# Patient Record
Sex: Female | Born: 1960
Health system: Southern US, Community
[De-identification: ages and names within clinical notes are randomized; demographics above are authoritative.]

## PROBLEM LIST (undated history)

## (undated) DIAGNOSIS — E785 Hyperlipidemia, unspecified: Secondary | ICD-10-CM

## (undated) DIAGNOSIS — I10 Essential (primary) hypertension: Secondary | ICD-10-CM

## (undated) DIAGNOSIS — C50919 Malignant neoplasm of unspecified site of unspecified female breast: Secondary | ICD-10-CM

## (undated) DIAGNOSIS — F419 Anxiety disorder, unspecified: Secondary | ICD-10-CM

## (undated) DIAGNOSIS — E119 Type 2 diabetes mellitus without complications: Secondary | ICD-10-CM

## (undated) DIAGNOSIS — R011 Cardiac murmur, unspecified: Secondary | ICD-10-CM

## (undated) HISTORY — DX: Cardiac murmur, unspecified: R01.1

## (undated) HISTORY — DX: Anxiety disorder, unspecified: F41.9

## (undated) HISTORY — PX: BREAST BIOPSY: SHX20

## (undated) HISTORY — PX: ABDOMINAL HYSTERECTOMY: SHX81

## (undated) HISTORY — DX: Hyperlipidemia, unspecified: E78.5

---

## 2014-04-01 HISTORY — PX: MASTECTOMY: SHX3

## 2014-08-31 DIAGNOSIS — C50919 Malignant neoplasm of unspecified site of unspecified female breast: Secondary | ICD-10-CM | POA: Insufficient documentation

## 2014-09-22 DIAGNOSIS — F419 Anxiety disorder, unspecified: Secondary | ICD-10-CM | POA: Insufficient documentation

## 2014-09-22 DIAGNOSIS — F411 Generalized anxiety disorder: Secondary | ICD-10-CM | POA: Insufficient documentation

## 2014-09-22 DIAGNOSIS — I10 Essential (primary) hypertension: Secondary | ICD-10-CM | POA: Insufficient documentation

## 2014-09-22 DIAGNOSIS — F339 Major depressive disorder, recurrent, unspecified: Secondary | ICD-10-CM | POA: Insufficient documentation

## 2015-02-07 ENCOUNTER — Ambulatory Visit: Payer: BLUE CROSS/BLUE SHIELD | Attending: Radiation Oncology | Admitting: Physical Therapy

## 2015-02-07 DIAGNOSIS — R530 Neoplastic (malignant) related fatigue: Secondary | ICD-10-CM | POA: Diagnosis present

## 2015-02-07 DIAGNOSIS — Z9189 Other specified personal risk factors, not elsewhere classified: Secondary | ICD-10-CM | POA: Diagnosis present

## 2015-02-07 DIAGNOSIS — M25612 Stiffness of left shoulder, not elsewhere classified: Secondary | ICD-10-CM | POA: Diagnosis present

## 2015-02-07 NOTE — Therapy (Signed)
[place], [place], [address] Phone: [phone]   Fax:  [phone]  Physical Therapy Evaluation  Patient Details  Name: Melissa Bray MRN: [id number] Date of Birth: 28-Nov-1960 Referring Provider: Dr, Malva Limes  Encounter Date: 02/07/2015      PT End of Session - 02/07/15 1804    Visit Number 1   Number of Visits 10   Date for PT Re-Evaluation 03/09/15   PT Start Time [id number]   PT Stop Time 1517   PT Time Calculation (min) 20 min   Activity Tolerance Patient tolerated treatment well      No past medical history on file.  No past surgical history on file.  There were no vitals filed for this visit.  Visit Diagnosis:  Shoulder stiffness, left - Plan: PT plan of care cert/re-cert  Neoplastic malignant related fatigue - Plan: PT plan of care cert/re-cert  At risk for lymphedema - Plan: PT plan of care cert/re-cert      Subjective Assessment - 02/07/15 1456    Subjective pt cannot get her arm in position for radiation treatment.  She goes back to radiologist Nov. 22  She also reports that she is fatigued and hasn't been doing much.  She knows she needs to get stronger before she goes back to work    Pertinent History left mastectomy in October 2016 25  lymph nodes removed.  She has previous chemotherapy from April til August 2016 with symptoms of numbness and tingling in hands and feet that is resolving    Patient Stated Goals to be able to start radiation on November 22    Currently in Pain? No/denies            [facility] PT Assessment - 02/07/15 0001    Assessment   Medical Diagnosis breast cancer    Referring Provider Dr, Malva Limes   Onset Date/Surgical Date 06/26/14   Hand Dominance Right   Precautions   Precaution Comments --  cancer , previous chemo   Restrictions   Weight Bearing Restrictions No   Balance Screen   Has the patient fallen in the past 6 months No   Has the patient had  a decrease in activity level because of a fear of falling?  Yes  had some numbness and tingling since chemo    Is the patient reluctant to leave their home because of a fear of falling?  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 --  moving patients, transferring, physical work at    Leisure walk occasionall, not much    Cognition   Overall Cognitive Status Within Functional Limits for tasks assessed   Observation/Other Assessments   Observations pt is obese.she has tape over mastctomy incision on left anterior chest and at lateral chest from drain site    Sensation   Light Touch Not tested   Coordination   Gross Motor Movements are Fluid and Coordinated Yes   AROM   Right Shoulder Flexion 165 Degrees   Right Shoulder ABduction 161 Degrees   Left Shoulder Flexion 86 Degrees   Left Shoulder ABduction 85 Degrees   Left Shoulder Internal Rotation 80 Degrees   Left Shoulder External Rotation 55 Degrees  Strength   Overall Strength Deficits   Overall Strength Comments appears generally deconditioned   Left Shoulder Flexion 3-/5   Left Shoulder ABduction 3-/5   Left Shoulder External Rotation 3-/5           LYMPHEDEMA/ONCOLOGY QUESTIONNAIRE - 02/07/15 1515    Right Upper Extremity Lymphedema   10 cm Proximal to Olecranon Process 37.5 cm   Olecranon Process 28.5 cm   10 cm Proximal to Ulnar Styloid Process 24 cm   Just Proximal to Ulnar Styloid Process 18 cm   Across Hand at [employer] 21 cm   At [place] of 2nd Digit 6.5 cm   Left Upper Extremity Lymphedema   10 cm Proximal to Olecranon Process 37.2 cm   Olecranon Process 29.5 cm   10 cm Proximal to Ulnar Styloid Process 24.3 cm   Just Proximal to  Ulnar Styloid Process 17.9 cm   Across Hand at [employer] 21 cm   At [place] of 2nd Digit 6.3 cm           Quick Dash - 02/07/15 0001    Open a tight or new jar Moderate difficulty   Do heavy household chores (wash walls, wash floors) Mild difficulty   Carry a shopping bag or briefcase Mild difficulty   Wash your back Moderate difficulty   Use a knife to cut food No difficulty   Recreational activities in which you take some force or impact through your arm, shoulder, or hand (golf, hammering, tennis) Moderate difficulty   During the past week, to what extent has your arm, shoulder or hand problem interfered with your normal social activities with family, friends, neighbors, or groups? Modererately   During the past week, to what extent has your arm, shoulder or hand problem limited your work or other regular daily activities Modererately   Arm, shoulder, or hand pain. Moderate   Tingling (pins and needles) in your arm, shoulder, or hand Moderate   Difficulty Sleeping Moderate difficulty   DASH Score 40.91 %             [facility] Adult PT Treatment/Exercise - 02/07/15 0001    Shoulder Exercises: Supine   Other Supine Exercises dowel exercise for shoulder flexion and external rotation                 PT Education - 02/07/15 1803    Education provided Yes   Education Details supine cane exercise    Person(s) Educated Patient   Methods Explanation;Demonstration;Handout   Comprehension Verbalized understanding;Returned demonstration                [facility] Goals - 02/07/15 1801    CC Long Term Goal  #1   Title Patient will improve left shoulder abduction to 120 degrees so that she can achieve position needed for radiation therapy.   Baseline 85   Time 4   Period Weeks   Status New   CC Long Term Goal  #2   Title Patient with verbalize an understanding of lymphedema risk reduction precautions   Time 4   Period Weeks   Status New   CC Long Term  Goal  #3   Title Patient will decrease the DASH score to < 30   to demonstrate increased functional use of upper extremity   Baseline 40.91   Time 4   Period Weeks   Status New   CC Long Term Goal  #4   Title Patient will be  independent in a basic home exercise program so she can continue strengthening at home    Time 4   Period Weeks   Status New   CC Long Term Goal  #5   Title pt will increase number of sit to stand repetitions in 30 seconds by 2 to demonstrate functional improvement so she can perform daily activities with greater ease             Plan - 02/07/15 1806    Clinical Impression Statement Pt late for appt, but reports she wants to improve her shoulder range of motion so she can receive radiation. She also wants to learn about lymphedema risk reduction and  is also interested in eventual exercise program so she can increase strength to return to work.  She will also benefti from progressive strengthening program to upper quarter as she is at high risk to develop lymphedema Today, she demonstrated decreased active and passive range of motion of left shoulder abduction, flexion and external rotation. She responded well to initial exercise instruction.    Pt will benefit from skilled therapeutic intervention in order to improve on the following deficits Decreased range of motion;Obesity   Clinical Impairments Affecting Rehab Potential previous chemotherapy, obesity    PT Frequency 3x / week  may decrease to 2x per week once radiation starts    PT Duration 4 weeks   PT Treatment/Interventions Therapeutic exercise;Manual techniques;Therapeutic activities;Patient/family education;Passive range of motion   PT Next Visit Plan active and passive range of motion including external rotation, pulley, stretches. scapular activation , consider 6 minute walk test and  30 second sit to stand test for baseline    Recommended Other Services eventual strength ABC prgram, possible [facility]  at [place] and Agree with Plan of Care Patient         Problem List There are no active problems to display for this patient.   Donato Heinz. Owens Shark, PT   02/07/2015, 6:29 PM  [place], [place], [address] Phone: [phone]   Fax:  [phone]  Name: Wana Mount MRN: [id number] Date of Birth: [birth year]

## 2015-02-07 NOTE — Patient Instructions (Signed)
Cane Overhead - Supine  Hold cane at thighs with both hands, extend arms straight over head. Hold ___ seconds. Repeat ___ times. Do ___ times per day.  External Rotation (Eccentric), Active-Assist - Supine (Cane)  Lie on back, affected arm out from side, elbow at 90, forearm forward. Use cane to assist in lifting forearm of affected arm to neutral. Slowly lower for 3-5 seconds. 5__ reps per set, __5 sets per day, ___ days per week. Add ___ lbs when you achieve ___ repetitions.  Copyright  VHI. All rights reserved.

## 2015-02-08 ENCOUNTER — Ambulatory Visit: Payer: BLUE CROSS/BLUE SHIELD | Admitting: Physical Therapy

## 2015-02-14 ENCOUNTER — Ambulatory Visit: Payer: BLUE CROSS/BLUE SHIELD | Admitting: Physical Therapy

## 2015-02-14 DIAGNOSIS — Z9189 Other specified personal risk factors, not elsewhere classified: Secondary | ICD-10-CM

## 2015-02-14 DIAGNOSIS — M25612 Stiffness of left shoulder, not elsewhere classified: Secondary | ICD-10-CM | POA: Diagnosis not present

## 2015-02-14 DIAGNOSIS — R530 Neoplastic (malignant) related fatigue: Secondary | ICD-10-CM

## 2015-02-14 NOTE — Therapy (Signed)
[place], [place], [address] Phone: [phone]   Fax:  [phone]  Physical Therapy Treatment  Patient Details  Name: Melissa Bray MRN: [id number] Date of Birth: [birth year] Referring Provider: Dr, Malva Limes  Encounter Date: 02/14/2015      PT End of Session - 02/14/15 1711    Visit Number 2   Number of Visits 10   Date for PT Re-Evaluation 03/09/15   PT Start Time [id number]   PT Stop Time 1430   PT Time Calculation (min) 35 min   Activity Tolerance Patient tolerated treatment well   Behavior During Therapy [facility] for tasks assessed/performed      No past medical history on file.  No past surgical history on file.  There were no vitals filed for this visit.  Visit Diagnosis:  Shoulder stiffness, left  Neoplastic malignant related fatigue  At risk for lymphedema      Subjective Assessment - 02/14/15 1357    Subjective pt states she has been busy during the day and doing her exercises.  She is stiff when she gets out of bed in the morning.    Pertinent History left mastectomy in October 2016 25  lymph nodes removed.  She has previous chemotherapy from April til August 2016 with symptoms of numbness and tingling in hands and feet that is resolving    Patient Stated Goals to be able to start radiation on November 22    Currently in Pain? No/denies  just stiffness.                          [facility] Adult PT Treatment/Exercise - 02/14/15 0001    Shoulder Exercises: Supine   Other Supine Exercises dowel exercise for shoulder flexion and external rotation    Shoulder Exercises: Sidelying   External Rotation AROM;Left;5 reps   Other Sidelying Exercises scapular retraction    Shoulder Exercises: ROM/Strengthening   Other ROM/Strengthening Exercises wall washing    Other ROM/Strengthening Exercises table stretch into flexion and abduction    Manual Therapy   Manual Lymphatic Drainage  (MLD) brief manual lymph drainage to anterior chest, anterio and posteror shoulder    Passive ROM to left arm in all planes of motion as tolerated                        [facility] Goals - 02/07/15 1801    CC Long Term Goal  #1   Title Patient will improve left shoulder abduction to 120 degrees so that she can achieve position needed for radiation therapy.   Baseline 85   Time 4   Period Weeks   Status New   CC Long Term Goal  #2   Title Patient with verbalize an understanding of lymphedema risk reduction precautions   Time 4   Period Weeks   Status New   CC Long Term Goal  #3   Title Patient will decrease the DASH score to < 30   to demonstrate increased functional use of upper extremity   Baseline 40.91   Time 4   Period Weeks   Status New   CC Long Term Goal  #4   Title Patient will be independent in a basic home exercise program so she can continue strengthening at home    Time 4   Period Weeks   Status New   CC Long Term Goal  #5  Title pt will increase number of sit to stand repetitions in 30 seconds by 2 to demonstrate functional improvement so she can perform daily activities with greater ease             Plan - 02/14/15 1711    Clinical Impression Statement pt 10 min late for appt. She feels she has made some improvments, but still has tightness inshoulder.  Upgraded exercise program and added stretches for home.  Noted to have some edema in supraclavicular fossa and upper chest and shoulder and still has mesh on medial wound and wears a bandage over it.  Not ready for radiation yet    Clinical Impairments Affecting Rehab Potential previous chemotherapy, obesity    PT Next Visit Plan active and passive range of motion including external rotation, pulley, stretches. scapular activation , consider 6 minute walk test and  30 second sit to stand test for baseline    Consulted and Agree with Plan of Care Patient        Problem List There are  no active problems to display for this patient.  Donato Heinz. Owens Shark, PT  02/14/2015, 5:14 PM  [place], [place], [address] Phone: [phone]   Fax:  [phone]  Name: Melissa Bray MRN: [id number] Date of Birth: 12-04-1960

## 2015-02-15 ENCOUNTER — Ambulatory Visit: Payer: BLUE CROSS/BLUE SHIELD

## 2015-02-15 DIAGNOSIS — M25612 Stiffness of left shoulder, not elsewhere classified: Secondary | ICD-10-CM

## 2015-02-15 DIAGNOSIS — R530 Neoplastic (malignant) related fatigue: Secondary | ICD-10-CM

## 2015-02-15 DIAGNOSIS — Z9189 Other specified personal risk factors, not elsewhere classified: Secondary | ICD-10-CM

## 2015-02-15 NOTE — Therapy (Addendum)
[place], [place], [address] Phone: [phone]   Fax:  [phone]  Physical Therapy Treatment  Patient Details  Name: Melissa Bray MRN: [id number] Date of Birth: 09/28/1960 Referring Provider: Dr, Malva Limes  Encounter Date: 02/15/2015      PT End of Session - 02/15/15 1711    Visit Number 3   Number of Visits 10   Date for PT Re-Evaluation 03/09/15   PT Start Time 1526   PT Stop Time 1607   PT Time Calculation (min) 41 min   Activity Tolerance Patient tolerated treatment well;Patient limited by pain   Behavior During Therapy [facility] for tasks assessed/performed      No past medical history on file.  No past surgical history on file.  There were no vitals filed for this visit.  Visit Diagnosis:  Shoulder stiffness, left  Neoplastic malignant related fatigue  At risk for lymphedema      Subjective Assessment - 02/15/15 1529    Subjective Nothing new to report today   Currently in Pain? No/denies                         [facility] Adult PT Treatment/Exercise - 02/15/15 0001    Shoulder Exercises: Pulleys   Flexion 2 minutes   ABduction 2 minutes   Shoulder Exercises: Therapy Ball   Flexion 10 reps  Roll yellow ball up wall   Shoulder Exercises: ROM/Strengthening   Other ROM/Strengthening Exercises Modified downward dog on wall 5 reps for 5 seconds   Manual Therapy   Manual Lymphatic Drainage (MLD) In Supine: Short neck, Lt inguinal nodes, Lt axillo-iguinal anastomosis, Rt axillary nodes and anterior inter-axillary anastomsis, anterior chest, anterior and posterior shoulder    Passive ROM to left arm in all planes of motion as tolerated                        Long Term Clinic Goals - 02/15/15 1717    CC Long Term Goal  #1   Title Patient will improve left shoulder abduction to 120 degrees so that she can achieve position needed for radiation therapy.   Status On-going   CC Long Term Goal  #2   Title Patient with verbalize an understanding of lymphedema risk reduction precautions   Status On-going   CC Long Term Goal  #3   Title Patient will decrease the DASH score to < 30   to demonstrate increased functional use of upper extremity   Status On-going   CC Long Term Goal  #4   Title Patient will be independent in a basic home exercise program so she can continue strengthening at home    Status On-going            Plan - 02/15/15 1711    Clinical Impression Statement Pt was limited at her end ROM due to pain and reported having some with her end range AAROMwith stretches performed in the gym today. She overall thouh tolerated treatment well and is scheduled to have hre radiation simulation tomorrow and they plan to start the week after Thanksgiving (week after next). Pt was instructed in importance of being very compliant with continuing stretching at home until then to increase her AROM as much as possible and she verbalized understanding this.    Pt will benefit from skilled therapeutic intervention in order to improve on the following deficits Decreased range of motion;Obesity  Clinical Impairments Affecting Rehab Potential previous chemotherapy, obesity    PT Frequency 3x / week  may decrease to 2x/week once radiation starts   PT Duration 4 weeks   PT Treatment/Interventions Therapeutic exercise;Manual techniques;Therapeutic activities;Patient/family education;Passive range of motion   PT Next Visit Plan active and passive range of motion including external rotation, pulley, stretches. scapular activation , consider 6 minute walk test and  30 second sit to stand test for baseline    Consulted and Agree with Plan of Care Patient        Problem List There are no active problems to display for this patient.   Otelia Limes, PTA 02/15/2015, 5:18 PM  [place], [place], [address] Phone: [phone]   Fax:  [phone]  Name: Melissa Bray MRN: [id number] Date of Birth: 01/13/1961   PHYSICAL THERAPY DISCHARGE SUMMARY  Visits from Start of Care: 3 Current functional level related to goals / functional outcomes: unknown   Remaining deficits: unknown   Education / Equipment: unknown Plan: Patient agrees to discharge.  Patient goals were not met. Patient is being discharged due to not returning since the last visit.  ?????     Maudry Diego, PT 06/05/17 10:07 AM

## 2015-11-13 DIAGNOSIS — Z79811 Long term (current) use of aromatase inhibitors: Secondary | ICD-10-CM | POA: Insufficient documentation

## 2016-05-11 ENCOUNTER — Encounter (HOSPITAL_BASED_OUTPATIENT_CLINIC_OR_DEPARTMENT_OTHER): Payer: Self-pay | Admitting: *Deleted

## 2016-05-11 ENCOUNTER — Emergency Department (HOSPITAL_BASED_OUTPATIENT_CLINIC_OR_DEPARTMENT_OTHER)
Admission: EM | Admit: 2016-05-11 | Discharge: 2016-05-11 | Disposition: A | Discharge status: Home / Self Care | Payer: BLUE CROSS/BLUE SHIELD | Attending: Emergency Medicine | Admitting: Emergency Medicine

## 2016-05-11 DIAGNOSIS — I10 Essential (primary) hypertension: Secondary | ICD-10-CM | POA: Insufficient documentation

## 2016-05-11 DIAGNOSIS — Z79899 Other long term (current) drug therapy: Secondary | ICD-10-CM | POA: Insufficient documentation

## 2016-05-11 DIAGNOSIS — M545 Low back pain: Secondary | ICD-10-CM | POA: Diagnosis present

## 2016-05-11 DIAGNOSIS — M5442 Lumbago with sciatica, left side: Secondary | ICD-10-CM | POA: Insufficient documentation

## 2016-05-11 DIAGNOSIS — Z853 Personal history of malignant neoplasm of breast: Secondary | ICD-10-CM | POA: Insufficient documentation

## 2016-05-11 DIAGNOSIS — E119 Type 2 diabetes mellitus without complications: Secondary | ICD-10-CM | POA: Diagnosis not present

## 2016-05-11 DIAGNOSIS — Z7984 Long term (current) use of oral hypoglycemic drugs: Secondary | ICD-10-CM | POA: Insufficient documentation

## 2016-05-11 HISTORY — DX: Type 2 diabetes mellitus without complications: E11.9

## 2016-05-11 HISTORY — DX: Malignant neoplasm of unspecified site of unspecified female breast: C50.919

## 2016-05-11 HISTORY — DX: Essential (primary) hypertension: I10

## 2016-05-11 MED ORDER — TRAMADOL HCL 50 MG PO TABS
50.0000 mg | ORAL_TABLET | Freq: Once | ORAL | Status: AC
  Administered 2016-05-11: 50 mg via ORAL
  Filled 2016-05-11: qty 1

## 2016-05-11 MED ORDER — TRAMADOL HCL 50 MG PO TABS: 50.0000 mg | ORAL_TABLET | Freq: Four times a day (QID) | ORAL | 0 refills | Status: DC | PRN

## 2016-05-11 NOTE — ED Triage Notes (Signed)
Patient states she has a ten day history of lower back pain.  Was seen by her PCP five days ago and given muscle relaxers.  States the pain did not get better and she was seen at [facility] and given antibiotics for pressure in the lower abdomen.  States yesterday she followed up with Urology and was told she did not have any infections. States her back pain is getting worse and OTC meds are not helping.

## 2016-05-11 NOTE — ED Provider Notes (Signed)
[place] DEPT [facility] Provider Note   CSN: [id number] Arrival date & time: 05/11/16  1003     History   Chief Complaint Chief Complaint  Patient presents with  . Back Pain    HPI Melissa Bray is a 56 y.o. female with history of breast Ca, and DM not on insulin who presents to the ED with complaints of worsening lower back pain, which onset 11 days ago. She states this happened after cleaning. She states her pain is at the center of her back, radiating to her left leg, constant, sharp 10/10 pain. She reports moving and twisting makes pain and radiation worse. Patient states that she has tried a muscle relaxer, motrin, and Tylenol without relief. She states she was seen at the ED 3 days ago and was given antibiotics to treat for possible UTI. She also reports her frequency of urination and pressure to her lower abdominal area have been improving since Wednesday she was seen at the ED and started on the antibiotics. 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 Patient denies hx of previous back pain. Patient denies fever, chills, nausea, vomiting, diarrhea, or changes in bowel movements. Patient saw urologist yesterday and was told she did not have any infections. Pt denies history of IVDU. She states she finished chemo and radiation last year. She admits to mild numbness to feet and finger tips bilaterally that she states is chronic and said it is from her chemo. She denies any association of her numbness to her new lower back pain. She states that her blood pressure normally is raise whenever she sees a provider and has been told she has white coat syndrome. She states that she is followed closely with her primary regarding her blood pressure. She denies any other symptoms at this time.   The history is provided by the patient. No language interpreter was used.  Back Pain   Pertinent negatives include no fever, no numbness, no abdominal pain and no dysuria.    Past  Medical History:  Diagnosis Date  . Breast cancer ([place])   . Diabetes mellitus without complication ([place])   . Hypertension     There are no active problems to display for this patient.   Past Surgical History:  Procedure Laterality Date  . ABDOMINAL HYSTERECTOMY    . MASTECTOMY Left     OB History    No data available       Home Medications    Prior to Admission medications   Medication Sig Start Date End Date Taking? Authorizing Provider  anastrozole (ARIMIDEX) 1 MG tablet Take 1 mg by mouth daily.   Yes Historical Provider, MD  cloNIDine (CATAPRES) 0.1 MG tablet Take 0.1 mg by mouth 2 (two) times daily.   Yes Historical Provider, MD  LISINOPRIL PO Take by mouth.   Yes Historical Provider, MD  LORazepam (ATIVAN) 1 MG tablet Take 1 mg by mouth every 8 (eight) hours.   Yes Historical Provider, MD  METFORMIN HCL PO Take by mouth.   Yes Historical Provider, MD  HYDROcodone-acetaminophen (NORCO) 10-325 MG tablet Take 1 tablet by mouth every 6 (six) hours as needed.    Historical Provider, MD  traMADol (ULTRAM) 50 MG tablet Take 1 tablet (50 mg total) by mouth every 6 (six) hours as needed. 05/11/16   Marlie Kuennen Mathews [place], [place]    Family History No family history on file.  Social History Social History  Substance Use Topics  . Smoking status:  Not on file  . Smokeless tobacco: Not on file  . Alcohol use Not on file     Allergies   Patient has no known allergies.   Review of Systems Review of Systems  Constitutional: Negative for chills and fever.  Gastrointestinal: Negative for abdominal pain, diarrhea, nausea and vomiting.  Genitourinary: Positive for frequency (Improving). Negative for dysuria and hematuria.  Musculoskeletal: Positive for back pain.  Skin: Negative for wound.  Neurological: Negative for numbness.     Physical Exam Updated Vital Signs BP (!) 156/102 (BP Location: Right Arm)   Pulse 102   Temp 98.4 F (36.9 C) (Oral)   Resp 20   Ht 5\' 5"   (1.651 m)   Wt 113.4 kg   SpO2 100%   BMI 41.60 kg/m   Physical Exam  Constitutional: She is oriented to person, place, and time. She appears well-developed and well-nourished.  Well appearing  HENT:  Head: Normocephalic and atraumatic.  Nose: Nose normal.  Mouth/Throat: Oropharynx is clear and moist.  Eyes: EOM are normal. Pupils are equal, round, and reactive to light.  Neck: Normal range of motion.  Normal ROM, no neck tenderness. No nuchal rigidity  Cardiovascular: Normal rate, normal heart sounds and intact distal pulses.   Pulmonary/Chest: Effort normal and breath sounds normal. No respiratory distress.  Normal work of breathing  Abdominal: Soft. There is no tenderness. There is no rebound and no guarding.  Soft and nontender. No rebound or guarding. No pulsatile mass noted.   Musculoskeletal: She exhibits no tenderness or deformity.  No midline cervical, thoracic, or lumbar tenderness. No paraspinal tenderness. Good ROM of spine. No deformity. No obvious wound, redness, or swelling noted. Visibly in pain during movements.  Neurological: She is alert and oriented to person, place, and time.  Cranial Nerves:  III,IV, VI: ptosis not present, extra-ocular movements intact bilaterally, direct and consensual pupillary light reflexes intact bilaterally V: facial sensation, jaw opening, and bite strength equal bilaterally VII: eyebrow raise, eyelid close, smile, frown, pucker equal bilaterally VIII: hearing grossly normal bilaterally  IX,X: palate elevation and swallowing intact XI: bilateral shoulder shrug and lateral head rotation equal and strong XII: midline tongue extension  Negative pronator drift, negative Romberg, negative RAM's, negative heel-to-shin, negative finger to nose.    Sensory intact.  Muscle strength 5/5 Patient able to ambulate without difficulty.   SLR Right -  negative SLR Left -  negative  Skin: Skin is warm.  Psychiatric: She has a normal mood and  affect. Her behavior is normal.  Nursing note and vitals reviewed.    ED Treatments / Results  Labs (all labs ordered are listed, but only abnormal results are displayed) Labs Reviewed - No data to display  EKG  EKG Interpretation None       Radiology No results found.  Procedures Procedures (including critical care time)  Medications Ordered in ED Medications  traMADol (ULTRAM) tablet 50 mg (50 mg Oral Given 05/11/16 1157)     Initial Impression / Assessment and Plan / ED Course  I have reviewed the triage vital signs and the nursing notes.  Pertinent labs & imaging results that were available during my care of the patient were reviewed by me and considered in my medical decision making (see chart for details).     Patient with back pain.  No neurological deficits and normal neuro exam.  Patient is ambulatory.  No loss of bowel or bladder control.  No concern for cauda equina.  No  fever, night sweats, weight loss, IVDA, no recent procedure to back. Pt currently under treatment for UTI. Patient does have a history of breast cancer.  Patient recommended to follow up with primary care provider in 2-5 days regarding this visit and her recent symptoms. Informed that she may need to have an MRI outpatient regarding her concerns of history of cancer. She is afebrile, no apparent distress, hemodynamically stable. Supportive care and return precaution discussed. Appears safe for discharge at this time.  I have reviewed the [place] with no narcotics being given in the last 6 months. No history of substance abuse.    Final Clinical Impressions(s) / ED Diagnoses   Final diagnoses:  Acute midline low back pain with left-sided sciatica    New Prescriptions Discharge Medication List as of 05/11/2016 11:46 AM    START taking these medications   Details  traMADol (ULTRAM) 50 MG tablet Take 1 tablet (50 mg total) by mouth every 6 (six) hours  as needed., Starting Sat 05/11/2016, [place], [place] 05/11/16 1244    Gareth Morgan, MD 05/13/16 [phone]

## 2016-05-11 NOTE — Discharge Instructions (Signed)
Please take tramadol as needed for pain. Continue taking ibuprofen as needed for pain. He is warm compress to the area and stretch. Please follow-up with your primary care physician 2 -5 days as you may need an outpatient MRI done.  Get help right away if: You develop new bowel or bladder control problems. You have unusual weakness or numbness in your arms or legs. You develop nausea or vomiting. You develop abdominal pain. You feel faint.

## 2017-03-20 DIAGNOSIS — I10 Essential (primary) hypertension: Secondary | ICD-10-CM | POA: Diagnosis not present

## 2017-04-28 DIAGNOSIS — E119 Type 2 diabetes mellitus without complications: Secondary | ICD-10-CM | POA: Diagnosis not present

## 2017-04-28 DIAGNOSIS — K219 Gastro-esophageal reflux disease without esophagitis: Secondary | ICD-10-CM | POA: Insufficient documentation

## 2017-04-28 DIAGNOSIS — I10 Essential (primary) hypertension: Secondary | ICD-10-CM | POA: Diagnosis not present

## 2017-04-30 DIAGNOSIS — C50912 Malignant neoplasm of unspecified site of left female breast: Secondary | ICD-10-CM | POA: Diagnosis not present

## 2017-05-08 DIAGNOSIS — I890 Lymphedema, not elsewhere classified: Secondary | ICD-10-CM | POA: Diagnosis not present

## 2017-06-17 DIAGNOSIS — E119 Type 2 diabetes mellitus without complications: Secondary | ICD-10-CM

## 2017-06-17 DIAGNOSIS — I10 Essential (primary) hypertension: Secondary | ICD-10-CM | POA: Diagnosis not present

## 2017-06-17 HISTORY — DX: Type 2 diabetes mellitus without complications: E11.9

## 2017-06-20 DIAGNOSIS — E1165 Type 2 diabetes mellitus with hyperglycemia: Secondary | ICD-10-CM | POA: Diagnosis not present

## 2017-07-07 DIAGNOSIS — E119 Type 2 diabetes mellitus without complications: Secondary | ICD-10-CM | POA: Diagnosis not present

## 2017-07-09 DIAGNOSIS — H524 Presbyopia: Secondary | ICD-10-CM | POA: Diagnosis not present

## 2017-07-09 DIAGNOSIS — E119 Type 2 diabetes mellitus without complications: Secondary | ICD-10-CM | POA: Diagnosis not present

## 2017-07-09 DIAGNOSIS — Z7984 Long term (current) use of oral hypoglycemic drugs: Secondary | ICD-10-CM | POA: Diagnosis not present

## 2017-07-09 DIAGNOSIS — H52203 Unspecified astigmatism, bilateral: Secondary | ICD-10-CM | POA: Diagnosis not present

## 2017-07-09 DIAGNOSIS — H35363 Drusen (degenerative) of macula, bilateral: Secondary | ICD-10-CM | POA: Diagnosis not present

## 2017-07-09 DIAGNOSIS — H5203 Hypermetropia, bilateral: Secondary | ICD-10-CM | POA: Diagnosis not present

## 2017-08-19 DIAGNOSIS — Z1231 Encounter for screening mammogram for malignant neoplasm of breast: Secondary | ICD-10-CM | POA: Diagnosis not present

## 2017-10-28 DIAGNOSIS — C50919 Malignant neoplasm of unspecified site of unspecified female breast: Secondary | ICD-10-CM | POA: Diagnosis not present

## 2017-10-28 DIAGNOSIS — Z79811 Long term (current) use of aromatase inhibitors: Secondary | ICD-10-CM | POA: Diagnosis not present

## 2018-01-18 DIAGNOSIS — I10 Essential (primary) hypertension: Secondary | ICD-10-CM | POA: Diagnosis not present

## 2018-01-25 DIAGNOSIS — M79672 Pain in left foot: Secondary | ICD-10-CM | POA: Diagnosis not present

## 2018-02-02 ENCOUNTER — Ambulatory Visit: Payer: BLUE CROSS/BLUE SHIELD | Admitting: Nurse Practitioner

## 2018-02-02 ENCOUNTER — Encounter: Payer: Self-pay | Admitting: Nurse Practitioner

## 2018-02-02 VITALS — BP 132/94 | HR 86 | Temp 98.2°F | Ht 65.0 in | Wt 295.2 lb

## 2018-02-02 DIAGNOSIS — Z23 Encounter for immunization: Secondary | ICD-10-CM | POA: Diagnosis not present

## 2018-02-02 DIAGNOSIS — E119 Type 2 diabetes mellitus without complications: Secondary | ICD-10-CM

## 2018-02-02 DIAGNOSIS — R319 Hematuria, unspecified: Secondary | ICD-10-CM | POA: Diagnosis not present

## 2018-02-02 DIAGNOSIS — G620 Drug-induced polyneuropathy: Secondary | ICD-10-CM | POA: Diagnosis not present

## 2018-02-02 DIAGNOSIS — I10 Essential (primary) hypertension: Secondary | ICD-10-CM | POA: Diagnosis not present

## 2018-02-02 DIAGNOSIS — T451X5A Adverse effect of antineoplastic and immunosuppressive drugs, initial encounter: Secondary | ICD-10-CM

## 2018-02-02 NOTE — Progress Notes (Signed)
Subjective:  Patient ID: Melissa Bray, female    DOB: 12-03-1960  Age: 57 y.o. MRN: [id number]  CC: Establish Care (est care/med refils. )   HPI  Previous pcp with [facility], last seen 06/2017.  DM Current use of Metformin 1000mg  BID and actos 45mg . Does not check glucose at home. Last eye exam 06/2017: normal per patient, done by Dr. Bradley Ferris with [facility] ophthalmology. Dental cleaning every 55months per patient Denies any hypoglycemia  Peripheral neuropathy secondary to Chemo: Neuropathy post radiation and chemotherapy. Lorazepam and hydrocodone prescribed by oncology.  HTN: Current use of Lisinopril 20mg , Metoprolol 50mg , Clonidine 0.1mg  BID. BP Readings from Last 3 Encounters:  02/02/18 (!) 132/94  05/11/16 (!) 156/102   Hematuria: Chronic, evaluated by Dr.purchinski (urology with [facility]), secondary to vaginal atrophy. S/p hysterectomy due to menorrhagia, benign biopsy per patient. No FHx of cervical or uterine cancer.  Reviewed past Medical, Social and Family history today.  Outpatient Medications Prior to Visit  Medication Sig Dispense Refill  . anastrozole (ARIMIDEX) 1 MG tablet Take 1 mg by mouth daily.    . cloNIDine (CATAPRES) 0.1 MG tablet Take 0.1 mg by mouth 2 (two) times daily.    Marland Kitchen lisinopril (PRINIVIL,ZESTRIL) 20 MG tablet Take by mouth.    . metFORMIN (GLUCOPHAGE) 500 MG tablet TAKE 2 TABLETS BY MOUTH TWICE A DAY    . metoprolol succinate (TOPROL-XL) 50 MG 24 hr tablet Take 50 mg by mouth daily.  0  . pioglitazone (ACTOS) 45 MG tablet Take by mouth.    Marland Kitchen HYDROcodone-acetaminophen (NORCO) 10-325 MG tablet Take 1 tablet by mouth every 6 (six) hours as needed.    Marland Kitchen LORazepam (ATIVAN) 1 MG tablet Take 1 mg by mouth every 8 (eight) hours.    . traMADol (ULTRAM) 50 MG tablet Take 1 tablet (50 mg total) by mouth every 6 (six) hours as needed. (Patient not taking: Reported on 02/02/2018) 15 tablet 0  . LISINOPRIL PO Take by mouth.    . METFORMIN HCL PO Take by mouth.       No facility-administered medications prior to visit.     ROS See HPI  Objective:  BP (!) 132/94   Pulse 86   Temp 98.2 F (36.8 C) (Oral)   Ht 5\' 5"  (1.651 m)   Wt 295 lb 3.2 oz (133.9 kg)   SpO2 99%   BMI 49.12 kg/m   BP Readings from Last 3 Encounters:  02/02/18 (!) 132/94  05/11/16 (!) 156/102    Wt Readings from Last 3 Encounters:  02/02/18 295 lb 3.2 oz (133.9 kg)  05/11/16 250 lb (113.4 kg)    Physical Exam  Constitutional: She is oriented to person, place, and time. She appears well-developed and well-nourished.  Neck: Normal range of motion. Neck supple. No thyromegaly present.  Cardiovascular: Normal rate, regular rhythm, normal heart sounds and intact distal pulses.  Pulmonary/Chest: Effort normal and breath sounds normal.  Musculoskeletal: Normal range of motion.  Neurological: She is alert and oriented to person, place, and time.  Skin: Skin is warm and dry. No erythema.  Diabetic foot exam completed  Psychiatric: She has a normal mood and affect. Her behavior is normal. Thought content normal.   Lab Results  Component Value Date   GLUCOSE 89 02/02/2018   ALT 20 02/02/2018   AST 17 02/02/2018   NA 139 02/02/2018   K 4.2 02/02/2018   CL 102 02/02/2018   CREATININE 0.75 02/02/2018   BUN 21 02/02/2018   CO2  29 02/02/2018   HGBA1C 7.0 (H) 02/02/2018   MICROALBUR 8.0 (H) 02/02/2018    Assessment & Plan:   Melissa Bray was seen today for establish care.  Diagnoses and all orders for this visit:  Type 2 diabetes mellitus without complication, without long-term current use of insulin (HCC) -     Hemoglobin A1c -     Comprehensive metabolic panel -     Microalbumin / creatinine urine ratio -     pioglitazone (ACTOS) 45 MG tablet; Take 1 tablet (45 mg total) by mouth daily. -     metFORMIN (GLUCOPHAGE) 1000 MG tablet; Take 1 tablet (1,000 mg total) by mouth 2 (two) times daily with a meal.  Essential hypertension -     Comprehensive metabolic  panel -     lisinopril (PRINIVIL,ZESTRIL) 20 MG tablet; Take 1 tablet (20 mg total) by mouth daily. -     metoprolol succinate (TOPROL-XL) 50 MG 24 hr tablet; Take 1 tablet (50 mg total) by mouth daily. -     cloNIDine (CATAPRES) 0.1 MG tablet; Take 1 tablet (0.1 mg total) by mouth 2 (two) times daily.  Need for pneumococcal vaccination -     Pneumococcal polysaccharide vaccine 23-valent greater than or equal to 2yo subcutaneous/IM  Neuropathy due to chemotherapeutic drug (HCC)  Hematuria, unspecified type   I have discontinued Melissa Bray's LISINOPRIL PO and METFORMIN HCL PO. I have also changed her pioglitazone, metFORMIN, lisinopril, metoprolol succinate, and cloNIDine. Additionally, I am having her maintain her LORazepam, HYDROcodone-acetaminophen, anastrozole, and traMADol.  Meds ordered this encounter  Medications  . pioglitazone (ACTOS) 45 MG tablet    Sig: Take 1 tablet (45 mg total) by mouth daily.    Dispense:  30 tablet    Refill:  2    Order Specific Question:   Supervising Provider    Answer:   Lucille Passy [[id number]]  . metFORMIN (GLUCOPHAGE) 1000 MG tablet    Sig: Take 1 tablet (1,000 mg total) by mouth 2 (two) times daily with a meal.    Dispense:  60 tablet    Refill:  2    Order Specific Question:   Supervising Provider    Answer:   Lucille Passy [[id number]]  . lisinopril (PRINIVIL,ZESTRIL) 20 MG tablet    Sig: Take 1 tablet (20 mg total) by mouth daily.    Dispense:  30 tablet    Refill:  2    Order Specific Question:   Supervising Provider    Answer:   Lucille Passy [[id number]]  . metoprolol succinate (TOPROL-XL) 50 MG 24 hr tablet    Sig: Take 1 tablet (50 mg total) by mouth daily.    Dispense:  30 tablet    Refill:  2    Order Specific Question:   Supervising Provider    Answer:   Lucille Passy [[id number]]  . cloNIDine (CATAPRES) 0.1 MG tablet    Sig: Take 1 tablet (0.1 mg total) by mouth 2 (two) times daily.    Dispense:  60 tablet    Refill:  2    Order Specific  Question:   Supervising Provider    Answer:   Lucille Passy [[id number]]    Follow-up: Return in about 3 months (around 05/05/2018) for DM and HTN.  Wilfred Lacy, NP

## 2018-02-02 NOTE — Patient Instructions (Addendum)
Please send medical release to get record from ophthalmology.  Check glucose once a day in morning (fasting), 4x/week. Bring to next office visit.  Go to lab for blood draw. Will send medication refill after review of results.

## 2018-02-02 NOTE — Assessment & Plan Note (Signed)
Chronic, evaluated by Dr.purchinski (urology with [facility]), secondary to vaginal atrophy. S/p hysterectomy due to menorrhagia, benign biopsy per patient. No FHx of cervical or uterine cancer.

## 2018-02-02 NOTE — Assessment & Plan Note (Signed)
Neuropathy post radiation and chemotherapy. Worse in hands than feet. Lorazepam and hydrocodone prescribed by oncology.

## 2018-02-03 ENCOUNTER — Encounter: Payer: Self-pay | Admitting: Nurse Practitioner

## 2018-02-03 LAB — COMPREHENSIVE METABOLIC PANEL
ALBUMIN: 4.1 g/dL (ref 3.5–5.2)
ALK PHOS: 70 U/L (ref 39–117)
ALT: 20 U/L (ref 0–35)
AST: 17 U/L (ref 0–37)
BUN: 21 mg/dL (ref 6–23)
CALCIUM: 9.7 mg/dL (ref 8.4–10.5)
CHLORIDE: 102 meq/L (ref 96–112)
CO2: 29 mEq/L (ref 19–32)
CREATININE: 0.75 mg/dL (ref 0.40–1.20)
GFR: 101.64 mL/min (ref >60.00)
Glucose, Bld: 89 mg/dL (ref 70–99)
Potassium: 4.2 mEq/L (ref 3.5–5.1)
Sodium: 139 mEq/L (ref 135–145)
Total Bilirubin: 0.2 mg/dL (ref 0.2–1.2)
Total Protein: 7.6 g/dL (ref 6.0–8.3)

## 2018-02-03 LAB — MICROALBUMIN / CREATININE URINE RATIO
CREATININE, U: 100.7 mg/dL
MICROALB UR: 8 mg/dL — AB (ref 0.0–1.9)
Microalb Creat Ratio: 8 mg/g (ref 0.0–30.0)

## 2018-02-03 LAB — HEMOGLOBIN A1C: Hgb A1c MFr Bld: 7 % — ABNORMAL HIGH (ref 4.6–6.5)

## 2018-02-03 MED ORDER — METFORMIN HCL 1000 MG PO TABS: 1000.0000 mg | ORAL_TABLET | Freq: Two times a day (BID) | ORAL | 2 refills | Status: DC

## 2018-02-03 MED ORDER — CLONIDINE HCL 0.1 MG PO TABS: 0.1000 mg | ORAL_TABLET | Freq: Two times a day (BID) | ORAL | 2 refills | Status: DC

## 2018-02-03 MED ORDER — LISINOPRIL 20 MG PO TABS: 20.0000 mg | ORAL_TABLET | Freq: Every day | ORAL | 2 refills | Status: DC

## 2018-02-03 MED ORDER — METOPROLOL SUCCINATE ER 50 MG PO TB24: 50.0000 mg | ORAL_TABLET | Freq: Every day | ORAL | 2 refills | Status: DC

## 2018-02-03 MED ORDER — PIOGLITAZONE HCL 45 MG PO TABS: 45.0000 mg | ORAL_TABLET | Freq: Every day | ORAL | 2 refills | Status: DC

## 2018-04-30 DIAGNOSIS — Z9012 Acquired absence of left breast and nipple: Secondary | ICD-10-CM | POA: Diagnosis not present

## 2018-04-30 DIAGNOSIS — Z79899 Other long term (current) drug therapy: Secondary | ICD-10-CM | POA: Diagnosis not present

## 2018-04-30 DIAGNOSIS — C50919 Malignant neoplasm of unspecified site of unspecified female breast: Secondary | ICD-10-CM | POA: Diagnosis not present

## 2018-05-05 ENCOUNTER — Ambulatory Visit: Payer: BLUE CROSS/BLUE SHIELD | Admitting: Nurse Practitioner

## 2018-05-12 ENCOUNTER — Encounter: Payer: Self-pay | Admitting: Nurse Practitioner

## 2018-05-12 ENCOUNTER — Ambulatory Visit: Payer: BLUE CROSS/BLUE SHIELD | Admitting: Nurse Practitioner

## 2018-05-12 VITALS — BP 160/110 | HR 97 | Temp 98.7°F | Ht 65.0 in | Wt 294.8 lb

## 2018-05-12 DIAGNOSIS — Z1211 Encounter for screening for malignant neoplasm of colon: Secondary | ICD-10-CM

## 2018-05-12 DIAGNOSIS — F419 Anxiety disorder, unspecified: Secondary | ICD-10-CM | POA: Diagnosis not present

## 2018-05-12 DIAGNOSIS — I10 Essential (primary) hypertension: Secondary | ICD-10-CM | POA: Diagnosis not present

## 2018-05-12 DIAGNOSIS — E119 Type 2 diabetes mellitus without complications: Secondary | ICD-10-CM

## 2018-05-12 LAB — POCT GLYCOSYLATED HEMOGLOBIN (HGB A1C): Hemoglobin A1C: 6.5 % — AB (ref 4.0–5.6)

## 2018-05-12 MED ORDER — METOPROLOL SUCCINATE ER 100 MG PO TB24: 100.0000 mg | ORAL_TABLET | Freq: Every day | ORAL | 1 refills | Status: DC

## 2018-05-12 MED ORDER — METFORMIN HCL 850 MG PO TABS: 850.0000 mg | ORAL_TABLET | Freq: Two times a day (BID) | ORAL | 1 refills | Status: DC

## 2018-05-12 MED ORDER — ESCITALOPRAM OXALATE 10 MG PO TABS: 10.0000 mg | ORAL_TABLET | Freq: Every day | ORAL | 2 refills | Status: DC

## 2018-05-12 MED ORDER — LISINOPRIL 20 MG PO TABS: 20.0000 mg | ORAL_TABLET | Freq: Every day | ORAL | 1 refills | Status: DC

## 2018-05-12 NOTE — Patient Instructions (Addendum)
I instructed pt to start 1/2 tablet once daily for 1 week and then increase to a full tablet once daily on week two as tolerated.   We discussed common side effects such as nausea, drowsiness and weight gain.  Also discussed rare but serious side effect of suicide ideation.  She is instructed to discontinue medication go directly to ED if this occurs.  Pt verbalizes understanding.   Plan follow up in 1 month to evaluate progress.   Make appt with support group as discussed  I have increased metoprolol to 100mg  once a day.  Changed metformin to 850mg  BID with food.  Maintain current dose of lisinopril and clonidine.  Schedule eye exam and mammogram as soon as possible (have reports faxed to me).   You will be contacted to schedule appt with GI for colonoscopy.

## 2018-05-12 NOTE — Assessment & Plan Note (Signed)
Uncontrolled with metoprolol, lisinopril and clonidine. Home BP reading: 140s/80s-150s/90s. Intermittent headache and dizziness with elevated BP. BP Readings from Last 3 Encounters:  05/12/18 (!) 160/110  02/02/18 (!) 132/94  05/11/16 (!) 156/102   Increased metoprolol to 100mg  F/up in 36month

## 2018-05-12 NOTE — Progress Notes (Signed)
Subjective:  Patient ID: Melissa Bray, female    DOB: [birth year]  Age: 58 y.o. MRN: [id number]  CC: Follow-up (f/u on DM and HTN/nervouse and stress cosult. )  Anxiety  Presents for initial visit. Onset was 6 to 12 months ago. The problem has been waxing and waning. Symptoms include decreased concentration, depressed mood, excessive worry, muscle tension, nervous/anxious behavior, palpitations, panic and restlessness. Patient reports no insomnia, irritability or suicidal ideas. Symptoms occur most days. The severity of symptoms is causing significant distress. Exacerbated by: cancer diagnosis and f/up appts, fear of cancer reoccurence.   Risk factors include a major life event. Her past medical history is significant for anxiety/panic attacks and depression. There is no history of bipolar disorder, CAD, fibromyalgia, hyperthyroidism or suicide attempts. Past treatments include benzodiazephines. The treatment provided mild relief. Compliance with prior treatments has been variable.  attended support group at cancer center in past, but stopped. She plans to resume.  HTN: Uncontrolled with metoprolol, lisinopril and clonidine. Home BP reading: 140s/80s-150s/90s. Intermittent headache and dizziness with elevated BP. BP Readings from Last 3 Encounters:  05/12/18 (!) 160/110  02/02/18 (!) 132/94  05/11/16 (!) 156/102   DM: Controlled with metformin. Last hgbA1c 7.0 Will schedule next eye exam. No neuropathy. No GI symptoms.  Reviewed past Medical, Social and Family history today.  Outpatient Medications Prior to Visit  Medication Sig Dispense Refill  . anastrozole (ARIMIDEX) 1 MG tablet Take 1 mg by mouth daily.    . cloNIDine (CATAPRES) 0.1 MG tablet Take 1 tablet (0.1 mg total) by mouth 2 (two) times daily. 60 tablet 2  . pioglitazone (ACTOS) 45 MG tablet Take 1 tablet (45 mg total) by mouth daily. 30 tablet 2  . lisinopril (PRINIVIL,ZESTRIL) 20 MG tablet Take 1 tablet (20 mg total)  by mouth daily. 30 tablet 2  . metFORMIN (GLUCOPHAGE) 1000 MG tablet Take 1 tablet (1,000 mg total) by mouth 2 (two) times daily with a meal. 60 tablet 2  . metoprolol succinate (TOPROL-XL) 50 MG 24 hr tablet Take 1 tablet (50 mg total) by mouth daily. 30 tablet 2  . HYDROcodone-acetaminophen (NORCO) 10-325 MG tablet Take 1 tablet by mouth every 6 (six) hours as needed.    Marland Kitchen LORazepam (ATIVAN) 1 MG tablet Take 1 mg by mouth every 8 (eight) hours.    . traMADol (ULTRAM) 50 MG tablet Take 1 tablet (50 mg total) by mouth every 6 (six) hours as needed. (Patient not taking: Reported on 02/02/2018) 15 tablet 0   No facility-administered medications prior to visit.     ROS See HPI  Objective:  BP (!) 160/110   Pulse 97   Temp 98.7 F (37.1 C) (Oral)   Ht 5\' 5"  (1.651 m)   Wt 294 lb 12.8 oz (133.7 kg)   SpO2 98%   BMI 49.06 kg/m   BP Readings from Last 3 Encounters:  05/12/18 (!) 160/110  02/02/18 (!) 132/94  05/11/16 (!) 156/102    Wt Readings from Last 3 Encounters:  05/12/18 294 lb 12.8 oz (133.7 kg)  02/02/18 295 lb 3.2 oz (133.9 kg)  05/11/16 250 lb (113.4 kg)    Physical Exam Vitals signs reviewed.  Neck:     Musculoskeletal: Normal range of motion and neck supple.  Cardiovascular:     Rate and Rhythm: Normal rate and regular rhythm.     Pulses: Normal pulses.     Heart sounds: Normal heart sounds.  Pulmonary:     Effort: Pulmonary effort  is normal.     Breath sounds: Normal breath sounds.  Musculoskeletal:     Right lower leg: No edema.     Left lower leg: No edema.  Lymphadenopathy:     Cervical: No cervical adenopathy.  Neurological:     Mental Status: She is alert and oriented to person, place, and time.  Psychiatric:        Attention and Perception: Attention and perception normal.        Mood and Affect: Mood is anxious. Mood is not depressed.        Speech: Speech normal.        Behavior: Behavior is cooperative.        Thought Content: Thought content  is not delusional. Thought content does not include homicidal or suicidal ideation. Thought content does not include homicidal or suicidal plan.        Cognition and Memory: Cognition and memory normal.        Judgment: Judgment normal.     Lab Results  Component Value Date   GLUCOSE 89 02/02/2018   ALT 20 02/02/2018   AST 17 02/02/2018   NA 139 02/02/2018   K 4.2 02/02/2018   CL 102 02/02/2018   CREATININE 0.75 02/02/2018   BUN 21 02/02/2018   CO2 29 02/02/2018   HGBA1C 6.5 (A) 05/12/2018   MICROALBUR 8.0 (H) 02/02/2018    Assessment & Plan:   Melissa Bray was seen today for follow-up.  Diagnoses and all orders for this visit:  Type 2 diabetes mellitus without complication, without long-term current use of insulin (HCC) -     POCT glycosylated hemoglobin (Hb A1C) -     metFORMIN (GLUCOPHAGE) 850 MG tablet; Take 1 tablet (850 mg total) by mouth 2 (two) times daily with a meal.  Essential hypertension -     lisinopril (PRINIVIL,ZESTRIL) 20 MG tablet; Take 1 tablet (20 mg total) by mouth daily. -     metoprolol succinate (TOPROL-XL) 100 MG 24 hr tablet; Take 1 tablet (100 mg total) by mouth daily.  Anxiety -     escitalopram (LEXAPRO) 10 MG tablet; Take 1 tablet (10 mg total) by mouth daily.  Colon cancer screening -     Ambulatory referral to Gastroenterology  Type 2 diabetes mellitus without retinopathy ([place])   I have discontinued Melissa Bray's LORazepam, HYDROcodone-acetaminophen, and traMADol. I have also changed her metoprolol succinate and metFORMIN. Additionally, I am having her start on escitalopram. Lastly, I am having her maintain her anastrozole, pioglitazone, cloNIDine, and lisinopril.  Meds ordered this encounter  Medications  . lisinopril (PRINIVIL,ZESTRIL) 20 MG tablet    Sig: Take 1 tablet (20 mg total) by mouth daily.    Dispense:  90 tablet    Refill:  1    Order Specific Question:   Supervising Provider    Answer:   Lucille Passy [[id number]]  . metoprolol  succinate (TOPROL-XL) 100 MG 24 hr tablet    Sig: Take 1 tablet (100 mg total) by mouth daily.    Dispense:  90 tablet    Refill:  1    Order Specific Question:   Supervising Provider    Answer:   Lucille Passy [[id number]]  . metFORMIN (GLUCOPHAGE) 850 MG tablet    Sig: Take 1 tablet (850 mg total) by mouth 2 (two) times daily with a meal.    Dispense:  180 tablet    Refill:  1    Order Specific Question:   Supervising Provider  Answer:   Lucille Passy [[id number]]  . escitalopram (LEXAPRO) 10 MG tablet    Sig: Take 1 tablet (10 mg total) by mouth daily.    Dispense:  30 tablet    Refill:  2    Order Specific Question:   Supervising Provider    Answer:   Lucille Passy [[id number]]    Problem List Items Addressed This Visit      Cardiovascular and Mediastinum   Essential hypertension    Uncontrolled with metoprolol, lisinopril and clonidine. Home BP reading: 140s/80s-150s/90s. Intermittent headache and dizziness with elevated BP. BP Readings from Last 3 Encounters:  05/12/18 (!) 160/110  02/02/18 (!) 132/94  05/11/16 (!) 156/102   Increased metoprolol to 100mg  F/up in 63month      Relevant Medications   lisinopril (PRINIVIL,ZESTRIL) 20 MG tablet   metoprolol succinate (TOPROL-XL) 100 MG 24 hr tablet     Endocrine   Type 2 diabetes mellitus without retinopathy ([place])    Controlled with metformin hgbA1c of 6.5 today.  Advised to schedule appt with ophthalmology.      Relevant Medications   lisinopril (PRINIVIL,ZESTRIL) 20 MG tablet   metFORMIN (GLUCOPHAGE) 850 MG tablet     Other   Anxiety    Started lexapro today. She plans to attend support groups and counseling sessions at cancer center. F/up in 37month      Relevant Medications   escitalopram (LEXAPRO) 10 MG tablet    Other Visit Diagnoses    Type 2 diabetes mellitus without complication, without long-term current use of insulin (HCC)    -  Primary   Relevant Medications   lisinopril (PRINIVIL,ZESTRIL) 20 MG tablet    metFORMIN (GLUCOPHAGE) 850 MG tablet   Other Relevant Orders   POCT glycosylated hemoglobin (Hb A1C) (Completed)   Colon cancer screening       Relevant Orders   Ambulatory referral to Gastroenterology      Follow-up: Return in about 4 weeks (around 06/09/2018) for depression and HTN (98mins, complete PHQ).  Wilfred Lacy, NP

## 2018-05-12 NOTE — Assessment & Plan Note (Signed)
Controlled with metformin hgbA1c of 6.5 today.  Advised to schedule appt with ophthalmology.

## 2018-05-12 NOTE — Assessment & Plan Note (Signed)
Started lexapro today. She plans to attend support groups and counseling sessions at cancer center. F/up in 41month

## 2018-05-13 ENCOUNTER — Encounter: Payer: Self-pay | Admitting: Gastroenterology

## 2018-05-15 ENCOUNTER — Telehealth: Payer: Self-pay | Admitting: Nurse Practitioner

## 2018-05-15 DIAGNOSIS — I10 Essential (primary) hypertension: Secondary | ICD-10-CM

## 2018-05-15 DIAGNOSIS — E119 Type 2 diabetes mellitus without complications: Secondary | ICD-10-CM

## 2018-05-15 MED ORDER — CLONIDINE HCL 0.1 MG PO TABS: 0.1000 mg | ORAL_TABLET | Freq: Two times a day (BID) | ORAL | 2 refills | Status: DC

## 2018-05-15 NOTE — Addendum Note (Signed)
Addended by: Leana Gamer on: 05/15/2018 09:37 AM   Modules accepted: Orders

## 2018-06-10 ENCOUNTER — Ambulatory Visit: Payer: BLUE CROSS/BLUE SHIELD | Admitting: Nurse Practitioner

## 2018-06-10 ENCOUNTER — Encounter: Payer: Self-pay | Admitting: Nurse Practitioner

## 2018-06-10 ENCOUNTER — Other Ambulatory Visit: Payer: Self-pay

## 2018-06-10 VITALS — BP 146/88 | HR 70 | Temp 98.7°F | Ht 65.0 in | Wt 291.6 lb

## 2018-06-10 DIAGNOSIS — E119 Type 2 diabetes mellitus without complications: Secondary | ICD-10-CM | POA: Diagnosis not present

## 2018-06-10 DIAGNOSIS — I10 Essential (primary) hypertension: Secondary | ICD-10-CM | POA: Diagnosis not present

## 2018-06-10 DIAGNOSIS — F419 Anxiety disorder, unspecified: Secondary | ICD-10-CM | POA: Diagnosis not present

## 2018-06-10 MED ORDER — PIOGLITAZONE HCL 45 MG PO TABS: 45.0000 mg | ORAL_TABLET | Freq: Every day | ORAL | 1 refills | Status: DC

## 2018-06-10 MED ORDER — ESCITALOPRAM OXALATE 10 MG PO TABS: 10.0000 mg | ORAL_TABLET | Freq: Every day | ORAL | 1 refills | Status: DC

## 2018-06-10 MED ORDER — CLONIDINE HCL 0.1 MG PO TABS: 0.1000 mg | ORAL_TABLET | Freq: Two times a day (BID) | ORAL | 1 refills | Status: DC

## 2018-06-10 NOTE — Patient Instructions (Addendum)
Continue current medications, walking daily and DASH diet.

## 2018-06-10 NOTE — Progress Notes (Signed)
Subjective:  Patient ID: Melissa Bray, female    DOB: 10-30-1960  Age: 57 y.o. MRN: [id number]  CC: Follow-up (4Wk Depression/anxiety : "i'm good" , PHQ9/GAD7 done)  HPI HTN: Improved with increased dose of metoprolol Also taking clonidine and lisinopril Home  BP readings: 130s-140s/ 80s-70s. No edema, no fatigue, no headache, no dizziness BP Readings from Last 3 Encounters:  06/10/18 (!) 146/88  05/12/18 (!) 160/110  02/02/18 (!) 132/94   Anxiety: Reports significant improvement in mood without any adverse side effects. Current use of lexapro. Also taking part in support group at cancer center Depression screen [facility] 2/9 06/10/2018 06/10/2018 05/12/2018  Decreased Interest 0 0 1  Down, Depressed, Hopeless 0 0 1  PHQ - 2 Score 0 0 2  Altered sleeping 1 - -  Tired, decreased energy 0 - -  Change in appetite 1 - -  Feeling bad or failure about yourself  0 - -  Trouble concentrating 0 - -  Moving slowly or fidgety/restless 0 - -  Suicidal thoughts 0 - -  PHQ-9 Score 2 - -   GAD 7 : Generalized Anxiety Score 06/10/2018  Nervous, Anxious, on Edge 0  Control/stop worrying 0  Worry too much - different things 0  Trouble relaxing 1  Restless 1  Easily annoyed or irritable 0  Afraid - awful might happen 0  Total GAD 7 Score 2   Reviewed past Medical, Social and Family history today.  Outpatient Medications Prior to Visit  Medication Sig Dispense Refill  . anastrozole (ARIMIDEX) 1 MG tablet Take 1 mg by mouth daily.    Marland Kitchen lisinopril (PRINIVIL,ZESTRIL) 20 MG tablet Take 1 tablet (20 mg total) by mouth daily. 90 tablet 1  . metFORMIN (GLUCOPHAGE) 850 MG tablet Take 1 tablet (850 mg total) by mouth 2 (two) times daily with a meal. 180 tablet 1  . metoprolol succinate (TOPROL-XL) 100 MG 24 hr tablet Take 1 tablet (100 mg total) by mouth daily. 90 tablet 1  . cloNIDine (CATAPRES) 0.1 MG tablet Take 1 tablet (0.1 mg total) by mouth 2 (two) times daily. 60 tablet 2  . escitalopram  (LEXAPRO) 10 MG tablet Take 1 tablet (10 mg total) by mouth daily. 30 tablet 2  . pioglitazone (ACTOS) 45 MG tablet Take 1 tablet (45 mg total) by mouth daily. 30 tablet 2   No facility-administered medications prior to visit.     ROS See HPI  Objective:  BP (!) 146/88   Pulse 70   Temp 98.7 F (37.1 C) (Oral)   Ht 5\' 5"  (1.651 m)   Wt 291 lb 9.6 oz (132.3 kg)   SpO2 97%   BMI 48.52 kg/m   BP Readings from Last 3 Encounters:  06/10/18 (!) 146/88  05/12/18 (!) 160/110  02/02/18 (!) 132/94    Wt Readings from Last 3 Encounters:  06/10/18 291 lb 9.6 oz (132.3 kg)  05/12/18 294 lb 12.8 oz (133.7 kg)  02/02/18 295 lb 3.2 oz (133.9 kg)    Physical Exam Vitals signs reviewed.  Cardiovascular:     Rate and Rhythm: Normal rate and regular rhythm.     Pulses: Normal pulses.     Heart sounds: Normal heart sounds.  Pulmonary:     Effort: Pulmonary effort is normal.     Breath sounds: Normal breath sounds.  Musculoskeletal:     Right lower leg: No edema.     Left lower leg: No edema.  Neurological:     Mental Status:  She is alert and oriented to person, place, and time.  Psychiatric:        Mood and Affect: Mood normal.        Behavior: Behavior normal.        Thought Content: Thought content normal.     Lab Results  Component Value Date   GLUCOSE 89 02/02/2018   ALT 20 02/02/2018   AST 17 02/02/2018   NA 139 02/02/2018   K 4.2 02/02/2018   CL 102 02/02/2018   CREATININE 0.75 02/02/2018   BUN 21 02/02/2018   CO2 29 02/02/2018   HGBA1C 6.5 (A) 05/12/2018   MICROALBUR 8.0 (H) 02/02/2018    Assessment & Plan:   Melissa Bray was seen today for follow-up.  Diagnoses and all orders for this visit:  Essential hypertension -     cloNIDine (CATAPRES) 0.1 MG tablet; Take 1 tablet (0.1 mg total) by mouth 2 (two) times daily.  Anxiety -     escitalopram (LEXAPRO) 10 MG tablet; Take 1 tablet (10 mg total) by mouth daily.  Type 2 diabetes mellitus without complication,  without long-term current use of insulin (HCC) -     pioglitazone (ACTOS) 45 MG tablet; Take 1 tablet (45 mg total) by mouth daily.   I am having Melissa Bray maintain her anastrozole, lisinopril, metoprolol succinate, metFORMIN, cloNIDine, escitalopram, and pioglitazone.  Meds ordered this encounter  Medications  . cloNIDine (CATAPRES) 0.1 MG tablet    Sig: Take 1 tablet (0.1 mg total) by mouth 2 (two) times daily.    Dispense:  180 tablet    Refill:  1    Order Specific Question:   Supervising Provider    Answer:   Lucille Passy [[id number]]  . escitalopram (LEXAPRO) 10 MG tablet    Sig: Take 1 tablet (10 mg total) by mouth daily.    Dispense:  90 tablet    Refill:  1    Order Specific Question:   Supervising Provider    Answer:   Lucille Passy [[id number]]  . pioglitazone (ACTOS) 45 MG tablet    Sig: Take 1 tablet (45 mg total) by mouth daily.    Dispense:  90 tablet    Refill:  1    Order Specific Question:   Supervising Provider    Answer:   Lucille Passy [[id number]]    Problem List Items Addressed This Visit      Cardiovascular and Mediastinum   Essential hypertension - Primary   Relevant Medications   cloNIDine (CATAPRES) 0.1 MG tablet     Other   Anxiety   Relevant Medications   escitalopram (LEXAPRO) 10 MG tablet    Other Visit Diagnoses    Type 2 diabetes mellitus without complication, without long-term current use of insulin (HCC)       Relevant Medications   pioglitazone (ACTOS) 45 MG tablet       Follow-up: Return in about 3 months (around 09/10/2018) for DM and HTN, hyperlipidemia (fasting).  Wilfred Lacy, NP

## 2018-06-15 ENCOUNTER — Telehealth: Payer: Self-pay | Admitting: *Deleted

## 2018-06-15 NOTE — Telephone Encounter (Signed)
Attempted Pt 2 times for covid -19 PV pre screen-  No answer

## 2018-06-15 NOTE — Telephone Encounter (Signed)
Covid-19 travel screening questions  Have you traveled in the last 14 days? If yes where?  Do you now or have you had a fever in the last 14 days?  Do you have any respiratory symptoms of shortness of breath or cough now or in the last 14 days?  Do you have a medical history of Congestive Heart Failure?  Do you have a medical history of lung disease?  Do you have any family members or close contacts with diagnosed or suspected Covid-19?    Called , no answer, no machine

## 2018-06-17 DIAGNOSIS — K760 Fatty (change of) liver, not elsewhere classified: Secondary | ICD-10-CM | POA: Diagnosis not present

## 2018-06-17 DIAGNOSIS — N390 Urinary tract infection, site not specified: Secondary | ICD-10-CM | POA: Diagnosis not present

## 2018-06-17 DIAGNOSIS — R1031 Right lower quadrant pain: Secondary | ICD-10-CM | POA: Diagnosis not present

## 2018-06-17 DIAGNOSIS — B9620 Unspecified Escherichia coli [E. coli] as the cause of diseases classified elsewhere: Secondary | ICD-10-CM | POA: Diagnosis not present

## 2018-06-17 DIAGNOSIS — N202 Calculus of kidney with calculus of ureter: Secondary | ICD-10-CM | POA: Diagnosis not present

## 2018-06-30 ENCOUNTER — Encounter: Payer: BLUE CROSS/BLUE SHIELD | Admitting: Gastroenterology

## 2018-07-30 ENCOUNTER — Ambulatory Visit: Payer: BLUE CROSS/BLUE SHIELD | Admitting: Nurse Practitioner

## 2018-08-09 ENCOUNTER — Other Ambulatory Visit: Payer: Self-pay | Admitting: Nurse Practitioner

## 2018-08-09 DIAGNOSIS — E119 Type 2 diabetes mellitus without complications: Secondary | ICD-10-CM

## 2018-08-25 DIAGNOSIS — Z1231 Encounter for screening mammogram for malignant neoplasm of breast: Secondary | ICD-10-CM | POA: Diagnosis not present

## 2018-08-25 LAB — HM MAMMOGRAPHY

## 2018-09-01 ENCOUNTER — Ambulatory Visit: Payer: BLUE CROSS/BLUE SHIELD | Admitting: Nurse Practitioner

## 2018-09-01 ENCOUNTER — Encounter: Payer: Self-pay | Admitting: Nurse Practitioner

## 2018-09-01 ENCOUNTER — Other Ambulatory Visit: Payer: Self-pay

## 2018-09-01 VITALS — BP 136/84 | HR 65 | Temp 98.0°F | Ht 65.0 in | Wt 299.2 lb

## 2018-09-01 DIAGNOSIS — I10 Essential (primary) hypertension: Secondary | ICD-10-CM

## 2018-09-01 DIAGNOSIS — Z13220 Encounter for screening for lipoid disorders: Secondary | ICD-10-CM | POA: Diagnosis not present

## 2018-09-01 DIAGNOSIS — Z1159 Encounter for screening for other viral diseases: Secondary | ICD-10-CM

## 2018-09-01 DIAGNOSIS — Z136 Encounter for screening for cardiovascular disorders: Secondary | ICD-10-CM

## 2018-09-01 DIAGNOSIS — F419 Anxiety disorder, unspecified: Secondary | ICD-10-CM

## 2018-09-01 DIAGNOSIS — E119 Type 2 diabetes mellitus without complications: Secondary | ICD-10-CM | POA: Diagnosis not present

## 2018-09-01 LAB — HEPATIC FUNCTION PANEL
ALT: 21 U/L (ref 0–35)
AST: 22 U/L (ref 0–37)
Albumin: 3.9 g/dL (ref 3.5–5.2)
Alkaline Phosphatase: 62 U/L (ref 39–117)
Bilirubin, Direct: 0.1 mg/dL (ref 0.0–0.3)
Total Bilirubin: 0.3 mg/dL (ref 0.2–1.2)
Total Protein: 7.3 g/dL (ref 6.0–8.3)

## 2018-09-01 LAB — LIPID PANEL
Cholesterol: 157 mg/dL (ref 0–200)
HDL: 59.6 mg/dL (ref >39.00)
LDL Cholesterol: 87 mg/dL (ref 0–99)
NonHDL: 97.01
Total CHOL/HDL Ratio: 3
Triglycerides: 52 mg/dL (ref 0.0–149.0)
VLDL: 10.4 mg/dL (ref 0.0–40.0)

## 2018-09-01 LAB — BASIC METABOLIC PANEL
BUN: 18 mg/dL (ref 6–23)
CO2: 31 mEq/L (ref 19–32)
Calcium: 9.4 mg/dL (ref 8.4–10.5)
Chloride: 103 mEq/L (ref 96–112)
Creatinine, Ser: 0.73 mg/dL (ref 0.40–1.20)
GFR: 98.46 mL/min (ref >60.00)
Glucose, Bld: 111 mg/dL — ABNORMAL HIGH (ref 70–99)
Potassium: 3.8 mEq/L (ref 3.5–5.1)
Sodium: 139 mEq/L (ref 135–145)

## 2018-09-01 LAB — HEMOGLOBIN A1C: Hgb A1c MFr Bld: 8 % — ABNORMAL HIGH (ref 4.6–6.5)

## 2018-09-01 LAB — MICROALBUMIN / CREATININE URINE RATIO
Creatinine,U: 96.2 mg/dL
Microalb Creat Ratio: 5.2 mg/g (ref 0.0–30.0)
Microalb, Ur: 5 mg/dL — ABNORMAL HIGH (ref 0.0–1.9)

## 2018-09-01 NOTE — Progress Notes (Signed)
Subjective:  Patient ID: Melissa Bray, female    DOB: 1960-05-17  Age: 58 y.o. MRN: [id number]  CC: Follow-up (3 mo follow up--fasting--anastrozole and clonidine consult. BS is running around 130-140 and BP at home is running around 130/90s. FYI--will call to get eye exam and colonoscopy/ MM done 08/25/2018. )  HPI  HTN: Improved with metoprolol, clonidine, lisinopril, and metoprolol. BP Readings from Last 3 Encounters:  09/01/18 136/84  06/10/18 (!) 146/88  05/12/18 (!) 160/110   DM: Home glucose at noon (non fasting): 130s-140s Current use of metformin and actos Will schedule eye exam. UTD with vaccines.  Anxiety and depression: Mood stable with lexapro. Depression screen [facility] 2/9 06/10/2018 06/10/2018 05/12/2018  Decreased Interest 0 0 1  Down, Depressed, Hopeless 0 0 1  PHQ - 2 Score 0 0 2  Altered sleeping 1 - -  Tired, decreased energy 0 - -  Change in appetite 1 - -  Feeling bad or failure about yourself  0 - -  Trouble concentrating 0 - -  Moving slowly or fidgety/restless 0 - -  Suicidal thoughts 0 - -  PHQ-9 Score 2 - -   GAD 7 : Generalized Anxiety Score 06/10/2018  Nervous, Anxious, on Edge 0  Control/stop worrying 0  Worry too much - different things 0  Trouble relaxing 1  Restless 1  Easily annoyed or irritable 0  Afraid - awful might happen 0  Total GAD 7 Score 2    Reviewed past Medical, Social and Family history today.  Outpatient Medications Prior to Visit  Medication Sig Dispense Refill  . anastrozole (ARIMIDEX) 1 MG tablet Take 1 mg by mouth daily.    . cloNIDine (CATAPRES) 0.1 MG tablet Take 1 tablet (0.1 mg total) by mouth 2 (two) times daily. 180 tablet 1  . escitalopram (LEXAPRO) 10 MG tablet Take 1 tablet (10 mg total) by mouth daily. 90 tablet 1  . lisinopril (PRINIVIL,ZESTRIL) 20 MG tablet Take 1 tablet (20 mg total) by mouth daily. 90 tablet 1  . metFORMIN (GLUCOPHAGE) 850 MG tablet Take 1 tablet (850 mg total) by mouth 2 (two) times  daily with a meal. 180 tablet 1  . metoprolol succinate (TOPROL-XL) 100 MG 24 hr tablet Take 1 tablet (100 mg total) by mouth daily. 90 tablet 1  . pioglitazone (ACTOS) 45 MG tablet Take 1 tablet (45 mg total) by mouth daily. 90 tablet 1   No facility-administered medications prior to visit.    ROS Review of Systems  Constitutional: Negative.   Respiratory: Negative.   Cardiovascular: Negative.   Gastrointestinal: Negative.   Genitourinary: Negative.   Musculoskeletal: Negative.   Neurological: Negative for sensory change, focal weakness, weakness and headaches.  Psychiatric/Behavioral: Negative for depression, memory loss and suicidal ideas. The patient is not nervous/anxious and does not have insomnia.     Objective:  BP 136/84   Pulse 65   Temp 98 F (36.7 C) (Oral)   Ht 5\' 5"  (1.651 m)   Wt 299 lb 3.2 oz (135.7 kg)   SpO2 100%   BMI 49.79 kg/m   BP Readings from Last 3 Encounters:  09/01/18 136/84  06/10/18 (!) 146/88  05/12/18 (!) 160/110    Wt Readings from Last 3 Encounters:  09/01/18 299 lb 3.2 oz (135.7 kg)  06/10/18 291 lb 9.6 oz (132.3 kg)  05/12/18 294 lb 12.8 oz (133.7 kg)    Physical Exam Vitals signs reviewed.  Constitutional:      General: She is  not in acute distress.    Appearance: She is obese.  Neck:     Musculoskeletal: Normal range of motion and neck supple.  Cardiovascular:     Rate and Rhythm: Normal rate and regular rhythm.     Pulses: Normal pulses.     Heart sounds: Normal heart sounds.  Pulmonary:     Effort: Pulmonary effort is normal.     Breath sounds: Normal breath sounds.  Abdominal:     General: There is no distension.     Palpations: Abdomen is soft.     Tenderness: There is no abdominal tenderness.  Musculoskeletal: Normal range of motion.     Right lower leg: No edema.     Left lower leg: No edema.  Skin:    Comments: Normal diabetic foot exam  Neurological:     Mental Status: She is oriented to person, place, and  time.  Psychiatric:        Mood and Affect: Mood normal.        Behavior: Behavior normal.        Thought Content: Thought content normal.    Lab Results  Component Value Date   GLUCOSE 111 (H) 09/01/2018   CHOL 157 09/01/2018   TRIG 52.0 09/01/2018   HDL 59.60 09/01/2018   LDLCALC 87 09/01/2018   ALT 21 09/01/2018   AST 22 09/01/2018   NA 139 09/01/2018   K 3.8 09/01/2018   CL 103 09/01/2018   CREATININE 0.73 09/01/2018   BUN 18 09/01/2018   CO2 31 09/01/2018   HGBA1C 8.0 (H) 09/01/2018   MICROALBUR 5.0 (H) 09/01/2018    Assessment & Plan:   Melissa Bray was seen today for follow-up.  Diagnoses and all orders for this visit:  Type 2 diabetes mellitus without complication, without long-term current use of insulin (HCC) -     metFORMIN (GLUCOPHAGE) 850 MG tablet; Take 1 tablet (850 mg total) by mouth 2 (two) times daily with a meal. -     pioglitazone (ACTOS) 45 MG tablet; Take 1 tablet (45 mg total) by mouth daily.  Type 2 diabetes mellitus without retinopathy (HCC) -     Microalbumin / creatinine urine ratio -     Hemoglobin A1c -     Basic metabolic panel -     Hepatic function panel  Essential hypertension -     Basic metabolic panel -     cloNIDine (CATAPRES) 0.1 MG tablet; Take 1 tablet (0.1 mg total) by mouth 2 (two) times daily. -     lisinopril (ZESTRIL) 20 MG tablet; Take 1 tablet (20 mg total) by mouth daily. -     metoprolol succinate (TOPROL-XL) 100 MG 24 hr tablet; Take 1 tablet (100 mg total) by mouth daily.  Encounter for hepatitis C screening test for low risk patient -     Hepatitis C Antibody  Encounter for lipid screening for cardiovascular disease -     Lipid panel  Anxiety -     escitalopram (LEXAPRO) 10 MG tablet; Take 1 tablet (10 mg total) by mouth daily.   I have changed Melissa Bray's lisinopril. I am also having her maintain her anastrozole, cloNIDine, escitalopram, metFORMIN, metoprolol succinate, and pioglitazone.  Meds ordered this  encounter  Medications  . cloNIDine (CATAPRES) 0.1 MG tablet    Sig: Take 1 tablet (0.1 mg total) by mouth 2 (two) times daily.    Dispense:  180 tablet    Refill:  1    Order Specific  Question:   Supervising Provider    Answer:   Lucille Passy [[id number]]  . escitalopram (LEXAPRO) 10 MG tablet    Sig: Take 1 tablet (10 mg total) by mouth daily.    Dispense:  90 tablet    Refill:  1    Order Specific Question:   Supervising Provider    Answer:   Lucille Passy [[id number]]  . lisinopril (ZESTRIL) 20 MG tablet    Sig: Take 1 tablet (20 mg total) by mouth daily.    Dispense:  90 tablet    Refill:  1    Order Specific Question:   Supervising Provider    Answer:   Lucille Passy [[id number]]  . metFORMIN (GLUCOPHAGE) 850 MG tablet    Sig: Take 1 tablet (850 mg total) by mouth 2 (two) times daily with a meal.    Dispense:  180 tablet    Refill:  1    Order Specific Question:   Supervising Provider    Answer:   Lucille Passy [[id number]]  . metoprolol succinate (TOPROL-XL) 100 MG 24 hr tablet    Sig: Take 1 tablet (100 mg total) by mouth daily.    Dispense:  90 tablet    Refill:  1    Order Specific Question:   Supervising Provider    Answer:   Lucille Passy [[id number]]  . pioglitazone (ACTOS) 45 MG tablet    Sig: Take 1 tablet (45 mg total) by mouth daily.    Dispense:  90 tablet    Refill:  1    Order Specific Question:   Supervising Provider    Answer:   Lucille Passy [[id number]]    Problem List Items Addressed This Visit      Cardiovascular and Mediastinum   Essential hypertension   Relevant Medications   cloNIDine (CATAPRES) 0.1 MG tablet   lisinopril (ZESTRIL) 20 MG tablet   metoprolol succinate (TOPROL-XL) 100 MG 24 hr tablet   Other Relevant Orders   Basic metabolic panel (Completed)     Endocrine   Type 2 diabetes mellitus without retinopathy (HCC)   Relevant Medications   lisinopril (ZESTRIL) 20 MG tablet   metFORMIN (GLUCOPHAGE) 850 MG tablet   pioglitazone (ACTOS) 45 MG tablet   Other  Relevant Orders   Microalbumin / creatinine urine ratio (Completed)   Hemoglobin A1c (Completed)   Basic metabolic panel (Completed)   Hepatic function panel (Completed)     Other   Anxiety   Relevant Medications   escitalopram (LEXAPRO) 10 MG tablet    Other Visit Diagnoses    Type 2 diabetes mellitus without complication, without long-term current use of insulin (HCC)    -  Primary   Relevant Medications   lisinopril (ZESTRIL) 20 MG tablet   metFORMIN (GLUCOPHAGE) 850 MG tablet   pioglitazone (ACTOS) 45 MG tablet   Encounter for hepatitis C screening test for low risk patient       Relevant Orders   Hepatitis C Antibody   Encounter for lipid screening for cardiovascular disease       Relevant Orders   Lipid panel (Completed)      Follow-up: Return in about 6 months (around 03/03/2019) for DM and HTN (repeat PHQ9 and GAD).  Wilfred Lacy, NP

## 2018-09-01 NOTE — Patient Instructions (Addendum)
Go to lab for urine and blood draw.  Contact oncology for anastrozole refill.  Schedule annual eye exam and colonoscopy.  Will send medication refill after lab review.

## 2018-09-02 LAB — HEPATITIS C ANTIBODY
Hepatitis C Ab: NONREACTIVE
SIGNAL TO CUT-OFF: 0.02 (ref <1.00)

## 2018-09-02 MED ORDER — METFORMIN HCL 850 MG PO TABS: 850.0000 mg | ORAL_TABLET | Freq: Two times a day (BID) | ORAL | 1 refills | Status: DC

## 2018-09-02 MED ORDER — LISINOPRIL 20 MG PO TABS: 20.0000 mg | ORAL_TABLET | Freq: Every day | ORAL | 1 refills | Status: DC

## 2018-09-02 MED ORDER — CLONIDINE HCL 0.1 MG PO TABS: 0.1000 mg | ORAL_TABLET | Freq: Two times a day (BID) | ORAL | 1 refills | Status: DC

## 2018-09-02 MED ORDER — METOPROLOL SUCCINATE ER 100 MG PO TB24: 100.0000 mg | ORAL_TABLET | Freq: Every day | ORAL | 1 refills | Status: DC

## 2018-09-02 MED ORDER — ESCITALOPRAM OXALATE 10 MG PO TABS: 10.0000 mg | ORAL_TABLET | Freq: Every day | ORAL | 1 refills | Status: DC

## 2018-09-02 MED ORDER — PIOGLITAZONE HCL 45 MG PO TABS: 45.0000 mg | ORAL_TABLET | Freq: Every day | ORAL | 1 refills | Status: DC

## 2018-10-01 DIAGNOSIS — H2513 Age-related nuclear cataract, bilateral: Secondary | ICD-10-CM | POA: Diagnosis not present

## 2018-10-01 DIAGNOSIS — E119 Type 2 diabetes mellitus without complications: Secondary | ICD-10-CM | POA: Diagnosis not present

## 2018-10-01 DIAGNOSIS — H52203 Unspecified astigmatism, bilateral: Secondary | ICD-10-CM | POA: Diagnosis not present

## 2018-10-01 DIAGNOSIS — H524 Presbyopia: Secondary | ICD-10-CM | POA: Diagnosis not present

## 2018-10-01 DIAGNOSIS — H5203 Hypermetropia, bilateral: Secondary | ICD-10-CM | POA: Diagnosis not present

## 2018-10-01 DIAGNOSIS — H35363 Drusen (degenerative) of macula, bilateral: Secondary | ICD-10-CM | POA: Diagnosis not present

## 2018-10-01 DIAGNOSIS — Z7984 Long term (current) use of oral hypoglycemic drugs: Secondary | ICD-10-CM | POA: Diagnosis not present

## 2018-10-28 DIAGNOSIS — C50812 Malignant neoplasm of overlapping sites of left female breast: Secondary | ICD-10-CM | POA: Diagnosis not present

## 2018-10-28 DIAGNOSIS — C50919 Malignant neoplasm of unspecified site of unspecified female breast: Secondary | ICD-10-CM | POA: Diagnosis not present

## 2019-03-05 ENCOUNTER — Encounter: Payer: Self-pay | Admitting: Family Medicine

## 2019-03-05 ENCOUNTER — Telehealth: Payer: Self-pay | Admitting: Nurse Practitioner

## 2019-03-05 ENCOUNTER — Other Ambulatory Visit: Payer: Self-pay

## 2019-03-05 ENCOUNTER — Ambulatory Visit (INDEPENDENT_AMBULATORY_CARE_PROVIDER_SITE_OTHER): Payer: BC Managed Care – PPO | Admitting: Family Medicine

## 2019-03-05 VITALS — Ht 66.0 in | Wt 290.0 lb

## 2019-03-05 DIAGNOSIS — U071 COVID-19: Secondary | ICD-10-CM | POA: Diagnosis not present

## 2019-03-05 MED ORDER — GUAIFENESIN-CODEINE 100-10 MG/5ML PO SYRP: 5.0000 mL | ORAL_SOLUTION | Freq: Three times a day (TID) | ORAL | 0 refills | Status: DC | PRN

## 2019-03-05 MED ORDER — BENZONATATE 100 MG PO CAPS: 100.0000 mg | ORAL_CAPSULE | Freq: Two times a day (BID) | ORAL | 1 refills | Status: DC | PRN

## 2019-03-05 NOTE — Telephone Encounter (Signed)
appt today with Dr. Loletha Grayer

## 2019-03-05 NOTE — Progress Notes (Signed)
Virtual Visit via Video Note  I connected with Melissa Bray on 03/05/19 at  3:30 PM EST by a video enabled telemedicine application and verified that I am speaking with the correct person using two identifiers. Location patient: home Location provider: work  Persons participating in the virtual visit: patient, provider  I discussed the limitations of evaluation and management by telemedicine and the availability of in person appointments. The patient expressed understanding and agreed to proceed.  Chief Complaint  Patient presents with  . Acute Visit    positive for covid, sever cough and fatigue     HPI: Melissa Bray is a 58 y.o. female who tested positive for COVID yesterday. Symptoms began 2 days ago. 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  + dry cough, fatigue. No SOB, CP, fever, chills, body aches, headache, sore throat, myalgias, GI symptoms.  She does have pulse ox at home and will check daily along with temp.  Cough is keeping her from sleeping.   Past Medical History:  Diagnosis Date  . Breast cancer ([place])   . Diabetes mellitus without complication ([place])   . Hypertension     Past Surgical History:  Procedure Laterality Date  . ABDOMINAL HYSTERECTOMY    . BREAST BIOPSY    . MASTECTOMY Left 2016    Family History  Problem Relation Age of Onset  . Asthma Mother   . Cancer Father        prostate cancer  . Diabetes Father   . Hypertension Father   . Cancer Brother        prostate cancer  . Asthma Brother     Social History   Tobacco Use  . Smoking status: Never Smoker  . Smokeless tobacco: Never Used  Substance Use Topics  . Alcohol use: Never    Frequency: Never  . Drug use: Never     Current Outpatient Medications:  .  anastrozole (ARIMIDEX) 1 MG tablet, Take 1 mg by mouth daily., Disp: , Rfl:  .  cloNIDine (CATAPRES) 0.1 MG tablet, Take 1 tablet (0.1 mg total) by mouth 2 (two) times daily., Disp: 180 tablet, Rfl: 1 .  escitalopram (LEXAPRO) 10 MG  tablet, Take 1 tablet (10 mg total) by mouth daily., Disp: 90 tablet, Rfl: 1 .  lisinopril (ZESTRIL) 20 MG tablet, Take 1 tablet (20 mg total) by mouth daily., Disp: 90 tablet, Rfl: 1 .  metFORMIN (GLUCOPHAGE) 850 MG tablet, Take 1 tablet (850 mg total) by mouth 2 (two) times daily with a meal., Disp: 180 tablet, Rfl: 1 .  metoprolol succinate (TOPROL-XL) 100 MG 24 hr tablet, Take 1 tablet (100 mg total) by mouth daily., Disp: 90 tablet, Rfl: 1 .  pioglitazone (ACTOS) 45 MG tablet, Take 1 tablet (45 mg total) by mouth daily., Disp: 90 tablet, Rfl: 1  No Known Allergies    ROS: See pertinent positives and negatives per HPI.   EXAM:  VITALS per patient if applicable: Ht 5\' 6"  (1.676 m)   Wt 290 lb (131.5 kg)   BMI 46.81 kg/m    GENERAL: alert, oriented, in no acute distress  HEENT: atraumatic, conjunctiva clear, no obvious abnormalities on inspection of external nose and ears  NECK: normal movements of the head and neck  LUNGS: on inspection no signs of respiratory distress, breathing rate appears normal, no obvious gross SOB, gasping or wheezing, no conversational dyspnea  CV: no obvious cyanosis  PSYCH/NEURO: pleasant and cooperative, speech and thought processing grossly intact  ASSESSMENT AND PLAN:  1. COVID-19 virus infection - supportive care - rest, hydrations, tylenol or ibuprofen PRN - pt has home pulse ox and thermometer - will check daily and call if resting pulse ox < 94% Rx: - benzonatate (TESSALON) 100 MG capsule; Take 1 capsule (100 mg total) by mouth 2 (two) times daily as needed for cough.  Dispense: 30 capsule; Refill: 1 - guaiFENesin-codeine (ROBITUSSIN AC) 100-10 MG/5ML syrup; Take 5 mLs by mouth 3 (three) times daily as needed for cough.  Dispense: 120 mL; Refill: 0 - pt already out of work and will isolate x 10 days - f/u PRN    I discussed the assessment and treatment plan with the patient. The patient was provided an opportunity to ask questions  and all were answered. The patient agreed with the plan and demonstrated an understanding of the instructions.   The patient was advised to call back or seek an in-person evaluation if the symptoms worsen or if the condition fails to improve as anticipated.   Letta Median, DO

## 2019-03-05 NOTE — Telephone Encounter (Signed)
Please schedule video appt

## 2019-03-05 NOTE — Telephone Encounter (Signed)
I called patient about appointment scheduled for Monday. Patient canceled appointment due to testing positive for Covid. Patient stated that she has a cough and wanted to know if you can prescribed and let her know what she can take to help with the cough. Patient requested a call back at [phone].

## 2019-03-05 NOTE — Telephone Encounter (Signed)
Please help call and offer an virtual visit

## 2019-03-05 NOTE — Telephone Encounter (Signed)
Please advise 

## 2019-03-05 NOTE — Telephone Encounter (Signed)
Patient returning call to inquire if something could be sent into pharmacy for her. Please advise.

## 2019-03-08 ENCOUNTER — Ambulatory Visit: Payer: BLUE CROSS/BLUE SHIELD | Admitting: Nurse Practitioner

## 2019-03-10 ENCOUNTER — Ambulatory Visit: Payer: BC Managed Care – PPO | Admitting: Nurse Practitioner

## 2019-03-12 DIAGNOSIS — R0602 Shortness of breath: Secondary | ICD-10-CM | POA: Diagnosis not present

## 2019-03-12 DIAGNOSIS — R197 Diarrhea, unspecified: Secondary | ICD-10-CM | POA: Diagnosis not present

## 2019-03-12 DIAGNOSIS — R5383 Other fatigue: Secondary | ICD-10-CM | POA: Diagnosis not present

## 2019-03-12 DIAGNOSIS — U071 COVID-19: Secondary | ICD-10-CM | POA: Diagnosis not present

## 2019-03-12 DIAGNOSIS — R519 Headache, unspecified: Secondary | ICD-10-CM | POA: Diagnosis not present

## 2019-03-19 ENCOUNTER — Other Ambulatory Visit: Payer: Self-pay | Admitting: Nurse Practitioner

## 2019-03-19 DIAGNOSIS — I10 Essential (primary) hypertension: Secondary | ICD-10-CM

## 2019-03-19 NOTE — Telephone Encounter (Signed)
Last ov was 08/2018--charlotte wants pt to follow up in 6 mo-- Unable to leave vm, need to offer an virtual visit for the pt and send in enough if pt is out of this med.

## 2019-03-23 NOTE — Telephone Encounter (Signed)
Pt stated she still has some and pt will call before she runs out of this med. Rx denied.

## 2019-05-19 DIAGNOSIS — C50919 Malignant neoplasm of unspecified site of unspecified female breast: Secondary | ICD-10-CM | POA: Diagnosis not present

## 2019-05-19 DIAGNOSIS — Z79811 Long term (current) use of aromatase inhibitors: Secondary | ICD-10-CM | POA: Diagnosis not present

## 2019-06-29 ENCOUNTER — Other Ambulatory Visit: Payer: Self-pay | Admitting: Nurse Practitioner

## 2019-06-29 DIAGNOSIS — F419 Anxiety disorder, unspecified: Secondary | ICD-10-CM

## 2019-06-29 NOTE — Telephone Encounter (Signed)
Please advise, ok to send in rx to last until 07/06/19?

## 2019-07-06 ENCOUNTER — Other Ambulatory Visit: Payer: Self-pay

## 2019-07-06 ENCOUNTER — Encounter: Payer: Self-pay | Admitting: Nurse Practitioner

## 2019-07-06 ENCOUNTER — Ambulatory Visit (INDEPENDENT_AMBULATORY_CARE_PROVIDER_SITE_OTHER): Payer: BC Managed Care – PPO | Admitting: Nurse Practitioner

## 2019-07-06 VITALS — BP 128/84 | HR 100 | Ht 66.0 in | Wt 287.8 lb

## 2019-07-06 DIAGNOSIS — Z13220 Encounter for screening for lipoid disorders: Secondary | ICD-10-CM | POA: Diagnosis not present

## 2019-07-06 DIAGNOSIS — I10 Essential (primary) hypertension: Secondary | ICD-10-CM

## 2019-07-06 DIAGNOSIS — Z136 Encounter for screening for cardiovascular disorders: Secondary | ICD-10-CM | POA: Diagnosis not present

## 2019-07-06 DIAGNOSIS — E1165 Type 2 diabetes mellitus with hyperglycemia: Secondary | ICD-10-CM

## 2019-07-06 LAB — BASIC METABOLIC PANEL
BUN: 12 mg/dL (ref 6–23)
CO2: 30 mEq/L (ref 19–32)
Calcium: 9.7 mg/dL (ref 8.4–10.5)
Chloride: 101 mEq/L (ref 96–112)
Creatinine, Ser: 0.73 mg/dL (ref 0.40–1.20)
GFR: 98.18 mL/min (ref >60.00)
Glucose, Bld: 187 mg/dL — ABNORMAL HIGH (ref 70–99)
Potassium: 4.6 mEq/L (ref 3.5–5.1)
Sodium: 138 mEq/L (ref 135–145)

## 2019-07-06 LAB — MICROALBUMIN / CREATININE URINE RATIO
Creatinine,U: 157.3 mg/dL
Microalb Creat Ratio: 17.2 mg/g (ref 0.0–30.0)
Microalb, Ur: 27 mg/dL — ABNORMAL HIGH (ref 0.0–1.9)

## 2019-07-06 LAB — LIPID PANEL
Cholesterol: 139 mg/dL (ref 0–200)
HDL: 49.5 mg/dL (ref >39.00)
LDL Cholesterol: 74 mg/dL (ref 0–99)
NonHDL: 89.54
Total CHOL/HDL Ratio: 3
Triglycerides: 77 mg/dL (ref 0.0–149.0)
VLDL: 15.4 mg/dL (ref 0.0–40.0)

## 2019-07-06 LAB — HEMOGLOBIN A1C: Hgb A1c MFr Bld: 7.5 % — ABNORMAL HIGH (ref 4.6–6.5)

## 2019-07-06 MED ORDER — METOPROLOL SUCCINATE ER 100 MG PO TB24: 100.0000 mg | ORAL_TABLET | Freq: Every day | ORAL | 1 refills | Status: DC

## 2019-07-06 MED ORDER — CLONIDINE HCL 0.1 MG PO TABS: 0.1000 mg | ORAL_TABLET | Freq: Two times a day (BID) | ORAL | 1 refills | Status: DC

## 2019-07-06 MED ORDER — METFORMIN HCL 850 MG PO TABS: 850.0000 mg | ORAL_TABLET | Freq: Two times a day (BID) | ORAL | 1 refills | Status: DC

## 2019-07-06 MED ORDER — LISINOPRIL 20 MG PO TABS: 20.0000 mg | ORAL_TABLET | Freq: Every day | ORAL | 1 refills | Status: DC

## 2019-07-06 MED ORDER — PIOGLITAZONE HCL 30 MG PO TABS: 30.0000 mg | ORAL_TABLET | Freq: Every day | ORAL | 1 refills | Status: DC

## 2019-07-06 NOTE — Patient Instructions (Addendum)
Go to lab for blood draw and urine collection.  Have ophthalmology report faxed to me once completed  Let me know if you want referral to GI for colonoscopy.

## 2019-07-06 NOTE — Progress Notes (Addendum)
Subjective:  Patient ID: Melissa Bray, female    DOB: [birth year]  Age: 59 y.o. MRN: [id number]  CC: Follow-up (medication reflls-Pioglitazone//pt is due for eye exam shes going to schedule with Dr. Mauricio Po)  HPI DM: uncontrolled with fasting glucose readings >150 per patient. Current use of metformin and actos Denies any hypoglycemic symptoms, no GI side effects Last HgbA1c of 8 Positive neuropathy due to chemotherapy. BP at goal LDL at goal Positive urine microalbumin, current use of lisinopril Upcoming appt with opthalmology BP Readings from Last 3 Encounters:  07/06/19 128/84  09/01/18 136/84  06/10/18 (!) 146/88   Lipid Panel     Component Value Date/Time   CHOL 139 07/06/2019 0941   TRIG 77.0 07/06/2019 0941   HDL 49.50 07/06/2019 0941   CHOLHDL 3 07/06/2019 0941   VLDL 15.4 07/06/2019 0941   LDLCALC 74 07/06/2019 0941    Reviewed past Medical, Social and Family history today.  Outpatient Medications Prior to Visit  Medication Sig Dispense Refill  . anastrozole (ARIMIDEX) 1 MG tablet Take 1 mg by mouth daily.    . benzonatate (TESSALON) 100 MG capsule Take 1 capsule (100 mg total) by mouth 2 (two) times daily as needed for cough. 30 capsule 1  . cloNIDine (CATAPRES) 0.1 MG tablet Take 1 tablet (0.1 mg total) by mouth 2 (two) times daily. 180 tablet 1  . lisinopril (ZESTRIL) 20 MG tablet Take 1 tablet (20 mg total) by mouth daily. 90 tablet 1  . metFORMIN (GLUCOPHAGE) 850 MG tablet Take 1 tablet (850 mg total) by mouth 2 (two) times daily with a meal. 180 tablet 1  . metoprolol succinate (TOPROL-XL) 100 MG 24 hr tablet Take 1 tablet (100 mg total) by mouth daily. 90 tablet 1  . pioglitazone (ACTOS) 45 MG tablet Take 1 tablet (45 mg total) by mouth daily. 90 tablet 1  . escitalopram (LEXAPRO) 10 MG tablet Take 1 tablet (10 mg total) by mouth daily. Need office visit for additional refills (Patient not taking: Reported on 07/06/2019) 90 tablet 1  . guaiFENesin-codeine  (ROBITUSSIN AC) 100-10 MG/5ML syrup Take 5 mLs by mouth 3 (three) times daily as needed for cough. (Patient not taking: Reported on 07/06/2019) 120 mL 0   No facility-administered medications prior to visit.    ROS See HPI  Objective:  BP 128/84   Pulse 100   Ht 5\' 6"  (1.676 m)   Wt 287 lb 12.8 oz (130.5 kg)   SpO2 97%   BMI 46.45 kg/m   BP Readings from Last 3 Encounters:  07/06/19 128/84  09/01/18 136/84  06/10/18 (!) 146/88   Wt Readings from Last 3 Encounters:  07/06/19 287 lb 12.8 oz (130.5 kg)  03/05/19 290 lb (131.5 kg)  09/01/18 299 lb 3.2 oz (135.7 kg)   Physical Exam Vitals reviewed.  Constitutional:      Appearance: She is obese.  Cardiovascular:     Rate and Rhythm: Normal rate and regular rhythm.     Pulses: Normal pulses.     Heart sounds: Gallop present.   Pulmonary:     Effort: Pulmonary effort is normal.     Breath sounds: Normal breath sounds.  Musculoskeletal:     Right lower leg: No edema.     Left lower leg: No edema.  Skin:    General: Skin is warm and dry.  Neurological:     Mental Status: She is alert and oriented to person, place, and time.     Lab Results  Component Value Date   GLUCOSE 187 (H) 07/06/2019   CHOL 139 07/06/2019   TRIG 77.0 07/06/2019   HDL 49.50 07/06/2019   LDLCALC 74 07/06/2019   ALT 21 09/01/2018   AST 22 09/01/2018   NA 138 07/06/2019   K 4.6 07/06/2019   CL 101 07/06/2019   CREATININE 0.73 07/06/2019   BUN 12 07/06/2019   CO2 30 07/06/2019   HGBA1C 7.5 (H) 07/06/2019   MICROALBUR 27.0 (H) 07/06/2019    No results found.  Assessment & Plan:  This visit occurred during the SARS-CoV-2 public health emergency.  Safety protocols were in place, including screening questions prior to the visit, additional usage of staff PPE, and extensive cleaning of exam room while observing appropriate contact time as indicated for disinfecting solutions.   Melissa Bray was seen today for follow-up.  Diagnoses and all orders  for this visit:  Type 2 diabetes mellitus with hyperglycemia, without long-term current use of insulin (HCC) -     Hemoglobin A1c -     Microalbumin / creatinine urine ratio -     Basic metabolic panel -     Lipid panel -     metFORMIN (GLUCOPHAGE) 850 MG tablet; Take 1 tablet (850 mg total) by mouth 2 (two) times daily with a meal. -     pioglitazone (ACTOS) 30 MG tablet; Take 1 tablet (30 mg total) by mouth daily.  Essential hypertension -     Basic metabolic panel -     cloNIDine (CATAPRES) 0.1 MG tablet; Take 1 tablet (0.1 mg total) by mouth 2 (two) times daily. -     metoprolol succinate (TOPROL-XL) 100 MG 24 hr tablet; Take 1 tablet (100 mg total) by mouth daily. -     lisinopril (ZESTRIL) 20 MG tablet; Take 1 tablet (20 mg total) by mouth daily.  Encounter for lipid screening for cardiovascular disease -     Lipid panel   I have discontinued Melissa Bray's benzonatate, guaiFENesin-codeine, and escitalopram. I have also changed her pioglitazone. Additionally, I am having her maintain her anastrozole, cloNIDine, metoprolol succinate, lisinopril, and metFORMIN.  Meds ordered this encounter  Medications  . cloNIDine (CATAPRES) 0.1 MG tablet    Sig: Take 1 tablet (0.1 mg total) by mouth 2 (two) times daily.    Dispense:  180 tablet    Refill:  1    Order Specific Question:   Supervising Provider    Answer:   Ronnald Nian [id number]  . metoprolol succinate (TOPROL-XL) 100 MG 24 hr tablet    Sig: Take 1 tablet (100 mg total) by mouth daily.    Dispense:  90 tablet    Refill:  1    Order Specific Question:   Supervising Provider    Answer:   Ronnald Nian [id number]  . lisinopril (ZESTRIL) 20 MG tablet    Sig: Take 1 tablet (20 mg total) by mouth daily.    Dispense:  90 tablet    Refill:  1    Order Specific Question:   Supervising Provider    Answer:   Ronnald Nian [id number]  . metFORMIN (GLUCOPHAGE) 850 MG tablet    Sig: Take 1 tablet (850 mg total) by  mouth 2 (two) times daily with a meal.    Dispense:  180 tablet    Refill:  1    Order Specific Question:   Supervising Provider    Answer:   Ronnald Nian [id number]  . pioglitazone (ACTOS) 30 MG  tablet    Sig: Take 1 tablet (30 mg total) by mouth daily.    Dispense:  90 tablet    Refill:  1    Change in dose    Order Specific Question:   Supervising Provider    Answer:   Ronnald Nian [id number]    Problem List Items Addressed This Visit      Cardiovascular and Mediastinum   Essential hypertension   Relevant Medications   cloNIDine (CATAPRES) 0.1 MG tablet   metoprolol succinate (TOPROL-XL) 100 MG 24 hr tablet   lisinopril (ZESTRIL) 20 MG tablet   Other Relevant Orders   Basic metabolic panel (Completed)     Endocrine   Type 2 diabetes mellitus with hyperglycemia, without long-term current use of insulin ([place]) - Primary    uncontrolled with fasting glucose readings >150 per patient. Current use of metformin and actos Denies any hypoglycemic symptoms, no GI side effects Last HgbA1c of 8 Positive neuropathy due to chemotherapy. BP at goal LDL at goal Positive urine microalbumin, current use of lisinopril Upcoming appt with opthalmology  Repeat HgbA1c, BMP, and urine microalbumin today       Relevant Medications   lisinopril (ZESTRIL) 20 MG tablet   metFORMIN (GLUCOPHAGE) 850 MG tablet   pioglitazone (ACTOS) 30 MG tablet   Other Relevant Orders   Hemoglobin A1c (Completed)   Microalbumin / creatinine urine ratio (Completed)   Basic metabolic panel (Completed)   Lipid panel (Completed)    Other Visit Diagnoses    Encounter for lipid screening for cardiovascular disease       Relevant Orders   Lipid panel (Completed)       Follow-up: Return in about 3 months (around 10/05/2019) for DM and HTN, PAP exam (F2F, 80mins).  Wilfred Lacy, NP

## 2019-07-15 DIAGNOSIS — E119 Type 2 diabetes mellitus without complications: Secondary | ICD-10-CM | POA: Insufficient documentation

## 2019-07-15 DIAGNOSIS — E1165 Type 2 diabetes mellitus with hyperglycemia: Secondary | ICD-10-CM | POA: Insufficient documentation

## 2019-07-15 NOTE — Addendum Note (Signed)
Addended by: Wilfred Lacy L on: 07/15/2019 01:56 PM   Modules accepted: Level of Service

## 2019-07-15 NOTE — Assessment & Plan Note (Signed)
uncontrolled with fasting glucose readings >150 per patient. Current use of metformin and actos Denies any hypoglycemic symptoms, no GI side effects Last HgbA1c of 8 Positive neuropathy due to chemotherapy. BP at goal LDL at goal Positive urine microalbumin, current use of lisinopril Upcoming appt with opthalmology  Repeat HgbA1c, BMP, and urine microalbumin today

## 2019-08-03 DIAGNOSIS — R61 Generalized hyperhidrosis: Secondary | ICD-10-CM | POA: Diagnosis not present

## 2019-08-03 DIAGNOSIS — E11649 Type 2 diabetes mellitus with hypoglycemia without coma: Secondary | ICD-10-CM | POA: Diagnosis not present

## 2019-08-03 DIAGNOSIS — R42 Dizziness and giddiness: Secondary | ICD-10-CM | POA: Diagnosis not present

## 2019-08-04 ENCOUNTER — Ambulatory Visit: Payer: BC Managed Care – PPO | Admitting: Nurse Practitioner

## 2019-08-04 DIAGNOSIS — E162 Hypoglycemia, unspecified: Secondary | ICD-10-CM | POA: Diagnosis not present

## 2019-08-04 DIAGNOSIS — E669 Obesity, unspecified: Secondary | ICD-10-CM | POA: Diagnosis not present

## 2019-08-06 ENCOUNTER — Encounter: Payer: Self-pay | Admitting: Family

## 2019-08-06 ENCOUNTER — Other Ambulatory Visit: Payer: Self-pay

## 2019-08-06 ENCOUNTER — Ambulatory Visit: Payer: BC Managed Care – PPO | Admitting: Family

## 2019-08-06 VITALS — BP 130/100 | HR 85 | Temp 96.8°F | Ht 66.0 in | Wt 284.8 lb

## 2019-08-06 DIAGNOSIS — F419 Anxiety disorder, unspecified: Secondary | ICD-10-CM

## 2019-08-06 DIAGNOSIS — E1169 Type 2 diabetes mellitus with other specified complication: Secondary | ICD-10-CM

## 2019-08-06 DIAGNOSIS — E162 Hypoglycemia, unspecified: Secondary | ICD-10-CM | POA: Diagnosis not present

## 2019-08-06 MED ORDER — HYDROXYZINE PAMOATE 50 MG PO CAPS: 50.0000 mg | ORAL_CAPSULE | Freq: Three times a day (TID) | ORAL | 1 refills | Status: DC | PRN

## 2019-08-06 NOTE — Progress Notes (Signed)
Established Patient Office Visit  Subjective:  Patient ID: Melissa Bray, female    DOB: Jun 10, 1960  Age: 59 y.o. MRN: [id number]  CC:  Hypoglycemia hospital follow-up and Anxiety  Chief Complaint  Patient presents with  . Low glucose reading    Pt had ER visit 2 days ago for low BS.  Pt is here to follow up from ER visit.  Pt also would like to discuss anxiety.  Pt fasting BS this morning was 50    HPI: 59 year old female with a history of Type 2 diabetes is in for a recheck after being seen in the ED 2 days ago for hypoglycemia. She reports beginning to have clammy hands and disorientation. Her glucose was found to be 32. She has been making some dietary changes in her attempts to lose weight. She is now down to 500 mg of Metfomin twice a day and tolerating it well. She has not had any other episodes of hypoglycemia.   Has concerns of increased anxiety. Has been taking Lexapro for years but feels like it has never really helped. She reports doing some deep breathing exercises that helps her to cope. Episodes occur a few times a day then resolve. She has a wonderful family support system. Denies feelings of helplessness, hopelessness, no thoughts of death or dying.    Past Medical History:  Diagnosis Date  . Breast cancer ([place])   . Diabetes mellitus without complication ([place])   . Hypertension     Past Surgical History:  Procedure Laterality Date  . ABDOMINAL HYSTERECTOMY    . BREAST BIOPSY    . MASTECTOMY Left 2016    Family History  Problem Relation Age of Onset  . Asthma Mother   . Cancer Father        prostate cancer  . Diabetes Father   . Hypertension Father   . Cancer Brother        prostate cancer  . Asthma Brother     Social History   Socioeconomic History  . Marital status: Divorced    Spouse name: Not on file  . Number of children: 2  . Years of education: Not on file  . Highest education level: Not on file  Occupational History  . Not on file    Tobacco Use  . Smoking status: Never Smoker  . Smokeless tobacco: Never Used  Substance and Sexual Activity  . Alcohol use: Never  . Drug use: Never  . Sexual activity: Not Currently    Birth control/protection: Surgical  Other Topics Concern  . Not on file  Social History Narrative  . Not on file   Social Determinants of Health   Financial Resource Strain:   . Difficulty of Paying Living Expenses:   Food Insecurity:   . Worried About Charity fundraiser in the Last Year:   . Arboriculturist in the Last Year:   Transportation Needs:   . Film/video editor (Medical):   Marland Kitchen Lack of Transportation (Non-Medical):   Physical Activity:   . Days of Exercise per Week:   . Minutes of Exercise per Session:   Stress:   . Feeling of Stress :   Social Connections:   . Frequency of Communication with Friends and Family:   . Frequency of Social Gatherings with Friends and Family:   . Attends Religious Services:   . Active Member of Clubs or Organizations:   . Attends Archivist Meetings:   Marland Kitchen Marital  Status:   Intimate Partner Violence:   . Fear of Current or Ex-Partner:   . Emotionally Abused:   Marland Kitchen Physically Abused:   . Sexually Abused:     Outpatient Medications Prior to Visit  Medication Sig Dispense Refill  . anastrozole (ARIMIDEX) 1 MG tablet Take 1 mg by mouth daily.    . cloNIDine (CATAPRES) 0.1 MG tablet Take 1 tablet (0.1 mg total) by mouth 2 (two) times daily. 180 tablet 1  . lisinopril (ZESTRIL) 20 MG tablet Take 1 tablet (20 mg total) by mouth daily. 90 tablet 1  . metFORMIN (GLUCOPHAGE) 850 MG tablet Take 1 tablet (850 mg total) by mouth 2 (two) times daily with a meal. 180 tablet 1  . metoprolol succinate (TOPROL-XL) 100 MG 24 hr tablet Take 1 tablet (100 mg total) by mouth daily. 90 tablet 1  . pioglitazone (ACTOS) 30 MG tablet Take 1 tablet (30 mg total) by mouth daily. 90 tablet 1   No facility-administered medications prior to visit.    No Known  Allergies  ROS Review of Systems  Skin: Negative.   Psychiatric/Behavioral: The patient is nervous/anxious.   All other systems reviewed and are negative.     Objective:    Physical Exam  Constitutional: She is oriented to person, place, and time. She appears well-developed and well-nourished.  Cardiovascular: Normal rate, regular rhythm and normal heart sounds.  Pulmonary/Chest: Effort normal and breath sounds normal.  Abdominal: Soft. Bowel sounds are normal.  Musculoskeletal:        General: Normal range of motion.     Cervical back: Normal range of motion and neck supple.  Neurological: She is alert and oriented to person, place, and time.  Skin: Skin is warm and dry.  Psychiatric: She has a normal mood and affect.    BP (!) 130/100 (BP Location: Right Arm, Patient Position: Sitting, Cuff Size: Large)   Pulse 85   Temp (!) 96.8 F (36 C) (Temporal)   Ht 5\' 6"  (1.676 m)   Wt 284 lb 12.8 oz (129.2 kg)   SpO2 99%   BMI 45.97 kg/m  Wt Readings from Last 3 Encounters:  08/06/19 284 lb 12.8 oz (129.2 kg)  07/06/19 287 lb 12.8 oz (130.5 kg)  03/05/19 290 lb (131.5 kg)     Health Maintenance Due  Topic Date Due  . OPHTHALMOLOGY EXAM  Never done  . PAP SMEAR-Modifier  Never done  . COLONOSCOPY  Never done  . COVID-19 Vaccine (2 - Moderna 2-dose series) 06/08/2019    There are no preventive care reminders to display for this patient.  No results found for: TSH No results found for: WBC, HGB, HCT, MCV, PLT Lab Results  Component Value Date   NA 138 07/06/2019   K 4.6 07/06/2019   CO2 30 07/06/2019   GLUCOSE 187 (H) 07/06/2019   BUN 12 07/06/2019   CREATININE 0.73 07/06/2019   BILITOT 0.3 09/01/2018   ALKPHOS 62 09/01/2018   AST 22 09/01/2018   ALT 21 09/01/2018   PROT 7.3 09/01/2018   ALBUMIN 3.9 09/01/2018   CALCIUM 9.7 07/06/2019   GFR 98.18 07/06/2019   Lab Results  Component Value Date   CHOL 139 07/06/2019   Lab Results  Component Value Date    HDL 49.50 07/06/2019   Lab Results  Component Value Date   LDLCALC 74 07/06/2019   Lab Results  Component Value Date   TRIG 77.0 07/06/2019   Lab Results  Component Value Date  CHOLHDL 3 07/06/2019   Lab Results  Component Value Date   HGBA1C 7.5 (H) 07/06/2019      Assessment & Plan:   Problem List Items Addressed This Visit    Anxiety   Relevant Medications   hydrOXYzine (VISTARIL) 50 MG capsule    Other Visit Diagnoses    Hypoglycemia    -  Primary   Type 2 diabetes mellitus with other specified complication, without long-term current use of insulin ([place])       Morbid obesity ([place])          Meds ordered this encounter  Medications  . hydrOXYzine (VISTARIL) 50 MG capsule    Sig: Take 1 capsule (50 mg total) by mouth 3 (three) times daily as needed for itching or anxiety.    Dispense:  60 capsule    Refill:  1   Trinidi was seen today for low glucose reading.  Diagnoses and all orders for this visit:  Hypoglycemia  Type 2 diabetes mellitus with other specified complication, without long-term current use of insulin (HCC)  Anxiety  Morbid obesity ([place])  Other orders -     hydrOXYzine (VISTARIL) 50 MG capsule; Take 1 capsule (50 mg total) by mouth 3 (three) times daily as needed for itching or anxiety.   Follow-up: Return in about 3 weeks (around 08/27/2019).    Kennyth Arnold, FNP

## 2019-08-06 NOTE — Patient Instructions (Addendum)
Managing Anxiety, Adult After being diagnosed with an anxiety disorder, you may be relieved to know why you have felt or behaved a certain way. You may also feel overwhelmed about the treatment ahead and what it will mean for your life. With care and support, you can manage this condition and recover from it. How to manage lifestyle changes Managing stress and anxiety  Stress is your body's reaction to life changes and events, both good and bad. Most stress will last just a few hours, but stress can be ongoing and can lead to more than just stress. Although stress can play a major role in anxiety, it is not the same as anxiety. Stress is usually caused by something external, such as a deadline, test, or competition. Stress normally passes after the triggering event has ended.  Anxiety is caused by something internal, such as imagining a terrible outcome or worrying that something will go wrong that will devastate you. Anxiety often does not go away even after the triggering event is over, and it can become long-term (chronic) worry. It is important to understand the differences between stress and anxiety and to manage your stress effectively so that it does not lead to an anxious response. Talk with your health care provider or a counselor to learn more about reducing anxiety and stress. He or she may suggest tension reduction techniques, such as:  Music therapy. This can include creating or listening to music that you enjoy and that inspires you.  Mindfulness-based meditation. This involves being aware of your normal breaths while not trying to control your breathing. It can be done while sitting or walking.  Centering prayer. This involves focusing on a word, phrase, or sacred image that means something to you and brings you peace.  Deep breathing. To do this, expand your stomach and inhale slowly through your nose. Hold your breath for 3-5 seconds. Then exhale slowly, letting your stomach muscles  relax.  Self-talk. This involves identifying thought patterns that lead to anxiety reactions and changing those patterns.  Muscle relaxation. This involves tensing muscles and then relaxing them. Choose a tension reduction technique that suits your lifestyle and personality. These techniques take time and practice. Set aside 5-15 minutes a day to do them. Therapists can offer counseling and training in these techniques. The training to help with anxiety may be covered by some insurance plans. Other things you can do to manage stress and anxiety include:  Keeping a stress/anxiety diary. This can help you learn what triggers your reaction and then learn ways to manage your response.  Thinking about how you react to certain situations. You may not be able to control everything, but you can control your response.  Making time for activities that help you relax and not feeling guilty about spending your time in this way.  Visual imagery and yoga can help you stay calm and relax.  Medicines Medicines can help ease symptoms. Medicines for anxiety include:  Anti-anxiety drugs.  Antidepressants. Medicines are often used as a primary treatment for anxiety disorder. Medicines will be prescribed by a health care provider. When used together, medicines, psychotherapy, and tension reduction techniques may be the most effective treatment. Relationships Relationships can play a big part in helping you recover. Try to spend more time connecting with trusted friends and family members. Consider going to couples counseling, taking family education classes, or going to family therapy. Therapy can help you and others better understand your condition. How to recognize changes in your  anxiety Everyone responds differently to treatment for anxiety. Recovery from anxiety happens when symptoms decrease and stop interfering with your daily activities at home or work. This may mean that you will start to:  Have  better concentration and focus. Worry will interfere less in your daily thinking.  Sleep better.  Be less irritable.  Have more energy.  Have improved memory. It is important to recognize when your condition is getting worse. Contact your health care provider if your symptoms interfere with home or work and you feel like your condition is not improving. Follow these instructions at home: Activity  Exercise. Most adults should do the following: ? Exercise for at least 150 minutes each week. The exercise should increase your heart rate and make you sweat (moderate-intensity exercise). ? Strengthening exercises at least twice a week.  Get the right amount and quality of sleep. Most adults need 7-9 hours of sleep each night. Lifestyle   Eat a healthy diet that includes plenty of vegetables, fruits, whole grains, low-fat dairy products, and lean protein. Do not eat a lot of foods that are high in solid fats, added sugars, or salt.  Make choices that simplify your life.  Do not use any products that contain nicotine or tobacco, such as cigarettes, e-cigarettes, and chewing tobacco. If you need help quitting, ask your health care provider.  Avoid caffeine, alcohol, and certain over-the-counter cold medicines. These may make you feel worse. Ask your pharmacist which medicines to avoid. General instructions  Take over-the-counter and prescription medicines only as told by your health care provider.  Keep all follow-up visits as told by your health care provider. This is important. Where to find support You can get help and support from these sources:  Self-help groups.  Online and [employer].  A trusted spiritual leader.  Couples counseling.  Family education classes.  Family therapy. Where to find more information You may find that joining a support group helps you deal with your anxiety. The following sources can help you locate counselors or support groups near  you:  [place]: www.mentalhealthamerica.net  Anxiety and Depression Association of [place] (ADAA): https://www.clark.net/  National Alliance on Mental Illness (NAMI): www.nami.org Contact a health care provider if you:  Have a hard time staying focused or finishing daily tasks.  Spend many hours a day feeling worried about everyday life.  Become exhausted by worry.  Start to have headaches, feel tense, or have nausea.  Urinate more than normal.  Have diarrhea. Get help right away if you have:  A racing heart and shortness of breath.  Thoughts of hurting yourself or others. If you ever feel like you may hurt yourself or others, or have thoughts about taking your own life, get help right away. You can go to your nearest emergency department or call:  Your local emergency services (911 in the [place]  A suicide crisis helpline, such as the [employer] at 639 866 0935. This is open 24 hours a day. Summary  Taking steps to learn and use tension reduction techniques can help calm you and help prevent triggering an anxiety reaction.  When used together, medicines, psychotherapy, and tension reduction techniques may be the most effective treatment.  Family, friends, and partners can play a big part in helping you recover from an anxiety disorder. This information is not intended to replace advice given to you by your health care provider. Make sure you discuss any questions you have with your health care provider. Document Revised:  08/18/2018 Document Reviewed: 08/18/2018 Elsevier Patient Education  [place].   Preventing Hypoglycemia Hypoglycemia occurs when the level of sugar (glucose) in the blood is too low. Hypoglycemia can happen in people who do or do not have diabetes (diabetes mellitus). It can develop quickly, and it can be a medical emergency. For most people with diabetes, a blood glucose level below 70 mg/dL (3.9 mmol/L) is  considered hypoglycemia. Glucose is a type of sugar that provides the body's main source of energy. Certain hormones (insulin and glucagon) control the level of glucose in the blood. Insulin lowers blood glucose, and glucagon increases blood glucose. Hypoglycemia can result from having too much insulin in the bloodstream, or from not eating enough food that contains glucose. Your risk for hypoglycemia is higher:  If you take insulin or diabetes medicines to help lower your blood glucose or help your body make more insulin.  If you skip or delay a meal or snack.  If you are ill.  During and after exercise. You can prevent hypoglycemia by working with your health care provider to adjust your meal plan as needed and by taking other precautions. How can hypoglycemia affect me? Mild symptoms Mild hypoglycemia may not cause any symptoms. If you do have symptoms, they may include:  Hunger.  Anxiety.  Sweating and feeling clammy.  Dizziness or feeling light-headed.  Sleepiness.  Nausea.  Increased heart rate.  Headache.  Blurry vision.  Irritability.  Tingling or numbness around the mouth, lips, or tongue.  A change in coordination.  Restless sleep. If mild hypoglycemia is not recognized and treated, it can quickly become moderate or severe hypoglycemia. Moderate symptoms Moderate hypoglycemia can cause:  Mental confusion and poor judgment.  Behavior changes.  Weakness.  Irregular heartbeat. Severe symptoms Severe hypoglycemia is a medical emergency. It can cause:  Fainting.  Seizures.  Loss of consciousness (coma).  Death. What nutrition changes can be made?  Work with your health care provider or diet and nutrition specialist (dietitian) to make a healthy meal plan that is right for you. Follow your meal plan carefully.  Eat meals at regular times.  If recommended by your health care provider, have snacks between meals.  Donot skip or delay meals or  snacks. You can be at risk for hypoglycemia if you are not getting enough carbohydrates. What lifestyle changes can be made?   Work closely with your health care provider to manage your blood glucose. Make sure you know: ? Your goal blood glucose levels. ? How and when to check your blood glucose. ? The symptoms of hypoglycemia. It is important to treat it right away to keep it from becoming severe.  Do not drink alcohol on an empty stomach.  When you are ill, check your blood glucose more often than usual. Follow your sick day plan whenever you cannot eat or drink normally. Make this plan in advance with your health care provider.  Always check your blood glucose before, during, and after exercise. How is this treated? This condition can often be treated by immediately eating or drinking something that contains sugar, such as:  Fruit juice, 4-6 oz (120-150 mL).  Regular (not diet) soda, 4-6 oz (120-150 mL).  Low-fat milk, 4 oz (120 mL).  Several pieces of hard candy.  Sugar or honey, 1 Tbsp (15 mL). Treating hypoglycemia if you have diabetes If you are alert and able to swallow safely, follow the 15:15 rule:  Take 15 grams of a rapid-acting carbohydrate. Talk  with your health care provider about how much you should take.  Rapid-acting options include: ? Glucose pills (take 15 grams). ? 6-8 pieces of hard candy. ? 4-6 oz (120-150 mL) of fruit juice. ? 4-6 oz (120-150 mL) of regular (not diet) soda.  Check your blood glucose 15 minutes after you take the carbohydrate.  If the repeat blood glucose level is still at or below 70 mg/dL (3.9 mmol/L), take 15 grams of a carbohydrate again.  If your blood glucose level does not increase above 70 mg/dL (3.9 mmol/L) after 3 tries, seek emergency medical care.  After your blood glucose level returns to normal, eat a meal or a snack within 1 hour. Treating severe hypoglycemia Severe hypoglycemia is when your blood glucose level is at  or below 54 mg/dL (3 mmol/L). Severe hypoglycemia is a medical emergency. Get medical help right away. If you have severe hypoglycemia and you cannot eat or drink, you may need an injection of glucagon. A family member or close friend should learn how to check your blood glucose and how to give you a glucagon injection. Ask your health care provider if you need to have an emergency glucagon injection kit available. Severe hypoglycemia may need to be treated in a hospital. The treatment may include getting glucose through an IV. You may also need treatment for the cause of your hypoglycemia. Where to find more information  American Diabetes Association: www.diabetes.[employer] of Diabetes and Digestive and Kidney Diseases: [employer] Contact a health care provider if:  You have problems keeping your blood glucose in your target range.  You have frequent episodes of hypoglycemia. Get help right away if:  You continue to have hypoglycemia symptoms after eating or drinking something containing glucose.  Your blood glucose level is at or below 54 mg/dL (3 mmol/L).  You faint.  You have a seizure. These symptoms may represent a serious problem that is an emergency. Do not wait to see if the symptoms will go away. Get medical help right away. Call your local emergency services (911 in the [place] Summary  Know the symptoms of hypoglycemia, and when you are at risk for it (such as during exercise or when you are sick). Check your blood glucose often when you are at risk for hypoglycemia.  Hypoglycemia can develop quickly, and it can be dangerous if it is not treated right away. If you have a history of severe hypoglycemia, make sure you know how to use your glucagon injection kit.  Make sure you know how to treat hypoglycemia. Keep a carbohydrate snack available when you may be at risk for hypoglycemia. This information is not intended to replace advice given to you by your  health care provider. Make sure you discuss any questions you have with your health care provider. Document Revised: 07/10/2018 Document Reviewed: 11/13/2016 Elsevier Patient Education  [place].

## 2019-08-31 ENCOUNTER — Ambulatory Visit: Payer: BC Managed Care – PPO | Admitting: Nurse Practitioner

## 2019-08-31 DIAGNOSIS — Z0289 Encounter for other administrative examinations: Secondary | ICD-10-CM

## 2019-09-10 ENCOUNTER — Encounter: Payer: Self-pay | Admitting: Nurse Practitioner

## 2019-09-14 DIAGNOSIS — Z1231 Encounter for screening mammogram for malignant neoplasm of breast: Secondary | ICD-10-CM | POA: Diagnosis not present

## 2019-09-14 DIAGNOSIS — Z1239 Encounter for other screening for malignant neoplasm of breast: Secondary | ICD-10-CM | POA: Diagnosis not present

## 2019-09-23 ENCOUNTER — Other Ambulatory Visit: Payer: Self-pay | Admitting: Family

## 2019-10-06 ENCOUNTER — Other Ambulatory Visit: Payer: Self-pay | Admitting: Nurse Practitioner

## 2019-10-06 DIAGNOSIS — E119 Type 2 diabetes mellitus without complications: Secondary | ICD-10-CM

## 2019-10-21 ENCOUNTER — Encounter: Payer: Self-pay | Admitting: Nurse Practitioner

## 2019-10-21 ENCOUNTER — Ambulatory Visit: Payer: BC Managed Care – PPO | Admitting: Nurse Practitioner

## 2019-10-21 ENCOUNTER — Other Ambulatory Visit: Payer: Self-pay

## 2019-10-21 VITALS — BP 124/82 | HR 93 | Temp 96.9°F | Ht 66.0 in | Wt 287.8 lb

## 2019-10-21 DIAGNOSIS — I10 Essential (primary) hypertension: Secondary | ICD-10-CM | POA: Diagnosis not present

## 2019-10-21 DIAGNOSIS — Z1211 Encounter for screening for malignant neoplasm of colon: Secondary | ICD-10-CM | POA: Diagnosis not present

## 2019-10-21 DIAGNOSIS — E162 Hypoglycemia, unspecified: Secondary | ICD-10-CM

## 2019-10-21 DIAGNOSIS — E1165 Type 2 diabetes mellitus with hyperglycemia: Secondary | ICD-10-CM | POA: Diagnosis not present

## 2019-10-21 MED ORDER — METFORMIN HCL 500 MG PO TABS: 500.0000 mg | ORAL_TABLET | Freq: Two times a day (BID) | ORAL | 3 refills | Status: DC

## 2019-10-21 NOTE — Progress Notes (Signed)
Subjective:  Patient ID: Melissa Bray, female    DOB: Dec 13, 1960  Age: 59 y.o. MRN: [id number]  CC: Follow-up (DM-last reading pt reports 130, highest was 300//LISINOPRIL REFILL//no colonoscopy or recent eye)  HPI  Type 2 diabetes mellitus with hyperglycemia, without long-term current use of insulin (HCC) Last HgbA1c of 7.5 Hypoglycemic episodes. Admits to skipping meals Agreed to nutritionist referral D/c actos and decrease metformin to 500mg  BID Advised to consider use liraglutide injection in place if metformin. Advised about benefits of medication (weight loss and glucose control) and possible adverse side effects (nausea, constipation). F/up in 34months  Essential hypertension BP at goal lisinopril and metoprolol BP Readings from Last 3 Encounters:  10/21/19 124/82  08/06/19 (!) 130/100  07/06/19 128/84   Maintain current medications   Reviewed past Medical, Social and Family history today.  Outpatient Medications Prior to Visit  Medication Sig Dispense Refill  . anastrozole (ARIMIDEX) 1 MG tablet Take 1 mg by mouth daily.    . cloNIDine (CATAPRES) 0.1 MG tablet Take 1 tablet (0.1 mg total) by mouth 2 (two) times daily. 180 tablet 1  . hydrOXYzine (VISTARIL) 50 MG capsule TAKE 1 CAPSULE (50 MG TOTAL) BY MOUTH 3 (THREE) TIMES DAILY AS NEEDED FOR ITCHING OR ANXIETY. 60 capsule 1  . lisinopril (ZESTRIL) 20 MG tablet Take 1 tablet (20 mg total) by mouth daily. 90 tablet 1  . metoprolol succinate (TOPROL-XL) 100 MG 24 hr tablet Take 1 tablet (100 mg total) by mouth daily. 90 tablet 1  . metFORMIN (GLUCOPHAGE) 850 MG tablet Take 1 tablet (850 mg total) by mouth 2 (two) times daily with a meal. 180 tablet 1  . escitalopram (LEXAPRO) 10 MG tablet Take 10 mg by mouth daily.    . pioglitazone (ACTOS) 30 MG tablet Take 1 tablet (30 mg total) by mouth daily. (Patient not taking: Reported on 10/21/2019) 90 tablet 1   No facility-administered medications prior to visit.    ROS See  HPI  Objective:  BP 124/82   Pulse 93   Temp (!) 96.9 F (36.1 C) (Tympanic)   Ht 5\' 6"  (1.676 m)   Wt 287 lb 12.8 oz (130.5 kg)   SpO2 99%   BMI 46.45 kg/m   Physical Exam Vitals reviewed.  Constitutional:      Appearance: She is obese.  Cardiovascular:     Rate and Rhythm: Normal rate and regular rhythm.     Pulses: Normal pulses.     Heart sounds: No murmur heard.  Gallop present.   Pulmonary:     Effort: Pulmonary effort is normal.     Breath sounds: Normal breath sounds.  Abdominal:     General: Bowel sounds are normal.     Palpations: Abdomen is soft.  Musculoskeletal:     Right lower leg: No edema.     Left lower leg: No edema.  Neurological:     Mental Status: She is alert and oriented to person, place, and time.     Assessment & Plan:  This visit occurred during the SARS-CoV-2 public health emergency.  Safety protocols were in place, including screening questions prior to the visit, additional usage of staff PPE, and extensive cleaning of exam room while observing appropriate contact time as indicated for disinfecting solutions.   Melissa Bray was seen today for follow-up.  Diagnoses and all orders for this visit:  Type 2 diabetes mellitus with hyperglycemia, without long-term current use of insulin (HCC) -     metFORMIN (GLUCOPHAGE) 500  MG tablet; Take 1 tablet (500 mg total) by mouth 2 (two) times daily with a meal. -     Referral to Nutrition and Diabetes Services  Essential hypertension  Hypoglycemia -     Referral to Nutrition and Diabetes Services  Colon cancer screening -     Ambulatory referral to Gastroenterology    Problem List Items Addressed This Visit      Cardiovascular and Mediastinum   Essential hypertension    BP at goal lisinopril and metoprolol BP Readings from Last 3 Encounters:  10/21/19 124/82  08/06/19 (!) 130/100  07/06/19 128/84   Maintain current medications        Endocrine   Type 2 diabetes mellitus with  hyperglycemia, without long-term current use of insulin (HCC) - Primary    Last HgbA1c of 7.5 Hypoglycemic episodes. Admits to skipping meals Agreed to nutritionist referral D/c actos and decrease metformin to 500mg  BID Advised to consider use liraglutide injection in place if metformin. Advised about benefits of medication (weight loss and glucose control) and possible adverse side effects (nausea, constipation). F/up in 91months      Relevant Medications   metFORMIN (GLUCOPHAGE) 500 MG tablet   Other Relevant Orders   Referral to Nutrition and Diabetes Services    Other Visit Diagnoses    Hypoglycemia       Relevant Orders   Referral to Nutrition and Diabetes Services   Colon cancer screening       Relevant Orders   Ambulatory referral to Gastroenterology      Follow-up: Return in about 3 months (around 01/21/2020) for DM and HTN, hyperlipidemia, PAP screen (F2F, 70mins,fasting).  Wilfred Lacy, NP

## 2019-10-21 NOTE — Patient Instructions (Addendum)
Maintain metformin 500mg  BID with food. Let me know if you decide to switch to liraglutide injection. You will be contacted to schedule appt with GI and nutritionist. Have eye exam reports faxed to me.  Liraglutide injection (Weight Management) What is this medicine? LIRAGLUTIDE (LIR a GLOO tide) is used to help people lose weight and maintain weight loss. It is used with a reduced-calorie diet and exercise. This medicine may be used for other purposes; ask your health care provider or pharmacist if you have questions. COMMON BRAND NAME(S): Saxenda What should I tell my health care provider before I take this medicine? They need to know if you have any of these conditions:  endocrine tumors (MEN 2) or if someone in your family had these tumors  gallbladder disease  high cholesterol  history of alcohol abuse problem  history of pancreatitis  kidney disease or if you are on dialysis  liver disease  previous swelling of the tongue, face, or lips with difficulty breathing, difficulty swallowing, hoarseness, or tightening of the throat  stomach problems  suicidal thoughts, plans, or attempt; a previous suicide attempt by you or a family member  thyroid cancer or if someone in your family had thyroid cancer  an unusual or allergic reaction to liraglutide, other medicines, foods, dyes, or preservatives  pregnant or trying to get pregnant  breast-feeding How should I use this medicine? This medicine is for injection under the skin of your upper leg, stomach area, or upper arm. You will be taught how to prepare and give this medicine. Use exactly as directed. Take your medicine at regular intervals. Do not take it more often than directed. This drug comes with INSTRUCTIONS FOR USE. Ask your pharmacist for directions on how to use this drug. Read the information carefully. Talk to your pharmacist or health care provider if you have questions. It is important that you put your used  needles and syringes in a special sharps container. Do not put them in a trash can. If you do not have a sharps container, call your pharmacist or healthcare provider to get one. A special MedGuide will be given to you by the pharmacist with each prescription and refill. Be sure to read this information carefully each time. Talk to your pediatrician regarding the use of this medicine in children. Special care may be needed. Overdosage: If you think you have taken too much of this medicine contact a poison control center or emergency room at once. NOTE: This medicine is only for you. Do not share this medicine with others. What if I miss a dose? If you miss a dose, take it as soon as you can. If it is almost time for your next dose, take only that dose. Do not take double or extra doses. If you miss your dose for 3 days or more, call your doctor or health care professional to talk about how to restart this medicine. What may interact with this medicine?  insulin and other medicines for diabetes This list may not describe all possible interactions. Give your health care provider a list of all the medicines, herbs, non-prescription drugs, or dietary supplements you use. Also tell them if you smoke, drink alcohol, or use illegal drugs. Some items may interact with your medicine. What should I watch for while using this medicine? Visit your doctor or health care professional for regular checks on your progress. Drink plenty of fluids while taking this medicine. Check with your doctor or health care professional if you  get an attack of severe diarrhea, nausea, and vomiting. The loss of too much body fluid can make it dangerous for you to take this medicine. This medicine may affect blood sugar levels. Ask your healthcare provider if changes in diet or medicines are needed if you have diabetes. Patients and their families should watch out for worsening depression or thoughts of suicide. Also watch out for  sudden changes in feelings such as feeling anxious, agitated, panicky, irritable, hostile, aggressive, impulsive, severely restless, overly excited and hyperactive, or not being able to sleep. If this happens, especially at the beginning of treatment or after a change in dose, call your health care professional. Women should inform their health care provider if they wish to become pregnant or think they might be pregnant. Losing weight while pregnant is not advised and may cause harm to the unborn child. Talk to your health care provider for more information. What side effects may I notice from receiving this medicine? Side effects that you should report to your doctor or health care professional as soon as possible:  allergic reactions like skin rash, itching or hives, swelling of the face, lips, or tongue  breathing problems  diarrhea that continues or is severe  lump or swelling on the neck  severe nausea  signs and symptoms of infection like fever or chills; cough; sore throat; pain or trouble passing urine  signs and symptoms of low blood sugar such as feeling anxious; confusion; dizziness; increased hunger; unusually weak or tired; increased sweating; shakiness; cold, clammy skin; irritable; headache; blurred vision; fast heartbeat; loss of consciousness  signs and symptoms of kidney injury like trouble passing urine or change in the amount of urine  trouble swallowing  unusual stomach upset or pain  vomiting Side effects that usually do not require medical attention (report to your doctor or health care professional if they continue or are bothersome):  constipation  decreased appetite  diarrhea  fatigue  headache  nausea  pain, redness, or irritation at site where injected  stomach upset  stuffy or runny nose This list may not describe all possible side effects. Call your doctor for medical advice about side effects. You may report side effects to FDA at  1-800-FDA-1088. Where should I keep my medicine? Keep out of the reach of children. Store unopened pen in a refrigerator between 2 and 8 degrees C (36 and 46 degrees F). Do not freeze or use if the medicine has been frozen. Protect from light and excessive heat. After you first use the pen, it can be stored at room temperature between 15 and 30 degrees C (59 and 86 degrees F) or in a refrigerator. Throw away your used pen after 30 days or after the expiration date, whichever comes first. Do not store your pen with the needle attached. If the needle is left on, medicine may leak from the pen. NOTE: This sheet is a summary. It may not cover all possible information. If you have questions about this medicine, talk to your doctor, pharmacist, or health care provider.  2020 Elsevier/Gold Standard (2019-01-21 21:16:59)

## 2019-10-21 NOTE — Assessment & Plan Note (Addendum)
Last HgbA1c of 7.5 Hypoglycemic episodes. Admits to skipping meals Agreed to nutritionist referral D/c actos and decrease metformin to 500mg  BID Advised to consider use liraglutide injection in place if metformin. Advised about benefits of medication (weight loss and glucose control) and possible adverse side effects (nausea, constipation). F/up in 50months

## 2019-10-21 NOTE — Assessment & Plan Note (Signed)
BP at goal lisinopril and metoprolol BP Readings from Last 3 Encounters:  10/21/19 124/82  08/06/19 (!) 130/100  07/06/19 128/84   Maintain current medications

## 2019-11-23 DIAGNOSIS — R05 Cough: Secondary | ICD-10-CM | POA: Diagnosis not present

## 2019-11-23 DIAGNOSIS — R631 Polydipsia: Secondary | ICD-10-CM | POA: Diagnosis not present

## 2019-11-23 DIAGNOSIS — E1165 Type 2 diabetes mellitus with hyperglycemia: Secondary | ICD-10-CM | POA: Diagnosis not present

## 2019-12-08 ENCOUNTER — Other Ambulatory Visit: Payer: Self-pay

## 2019-12-09 ENCOUNTER — Ambulatory Visit: Payer: BC Managed Care – PPO | Admitting: Nurse Practitioner

## 2019-12-29 DIAGNOSIS — R7981 Abnormal blood-gas level: Secondary | ICD-10-CM | POA: Diagnosis not present

## 2019-12-29 DIAGNOSIS — E878 Other disorders of electrolyte and fluid balance, not elsewhere classified: Secondary | ICD-10-CM | POA: Diagnosis not present

## 2019-12-29 DIAGNOSIS — E1165 Type 2 diabetes mellitus with hyperglycemia: Secondary | ICD-10-CM | POA: Diagnosis not present

## 2020-01-13 ENCOUNTER — Other Ambulatory Visit: Payer: Self-pay | Admitting: Nurse Practitioner

## 2020-01-13 DIAGNOSIS — I10 Essential (primary) hypertension: Secondary | ICD-10-CM

## 2020-01-21 ENCOUNTER — Ambulatory Visit: Payer: BC Managed Care – PPO | Admitting: Nurse Practitioner

## 2020-02-01 ENCOUNTER — Encounter: Payer: Self-pay | Admitting: Nurse Practitioner

## 2020-02-01 ENCOUNTER — Ambulatory Visit: Payer: BC Managed Care – PPO | Admitting: Nurse Practitioner

## 2020-02-01 ENCOUNTER — Other Ambulatory Visit: Payer: Self-pay

## 2020-02-01 VITALS — BP 148/90 | HR 94 | Temp 96.2°F | Ht 65.0 in | Wt 282.0 lb

## 2020-02-01 DIAGNOSIS — I10 Essential (primary) hypertension: Secondary | ICD-10-CM

## 2020-02-01 DIAGNOSIS — F411 Generalized anxiety disorder: Secondary | ICD-10-CM | POA: Diagnosis not present

## 2020-02-01 DIAGNOSIS — E1165 Type 2 diabetes mellitus with hyperglycemia: Secondary | ICD-10-CM

## 2020-02-01 DIAGNOSIS — Z79811 Long term (current) use of aromatase inhibitors: Secondary | ICD-10-CM

## 2020-02-01 DIAGNOSIS — Z23 Encounter for immunization: Secondary | ICD-10-CM | POA: Diagnosis not present

## 2020-02-01 LAB — BASIC METABOLIC PANEL
BUN: 10 mg/dL (ref 6–23)
CO2: 30 mEq/L (ref 19–32)
Calcium: 9.2 mg/dL (ref 8.4–10.5)
Chloride: 99 mEq/L (ref 96–112)
Creatinine, Ser: 0.76 mg/dL (ref 0.40–1.20)
GFR: 84.69 mL/min (ref >60.00)
Glucose, Bld: 200 mg/dL — ABNORMAL HIGH (ref 70–99)
Potassium: 3.7 mEq/L (ref 3.5–5.1)
Sodium: 136 mEq/L (ref 135–145)

## 2020-02-01 LAB — HEMOGLOBIN A1C: Hgb A1c MFr Bld: 10.9 % — ABNORMAL HIGH (ref 4.6–6.5)

## 2020-02-01 MED ORDER — LISINOPRIL 20 MG PO TABS: 20.0000 mg | ORAL_TABLET | Freq: Every day | ORAL | 3 refills | Status: DC

## 2020-02-01 MED ORDER — HYDROXYZINE PAMOATE 50 MG PO CAPS: 50.0000 mg | ORAL_CAPSULE | Freq: Three times a day (TID) | ORAL | 2 refills | Status: DC | PRN

## 2020-02-01 MED ORDER — CLONIDINE HCL 0.1 MG PO TABS: 0.1000 mg | ORAL_TABLET | Freq: Two times a day (BID) | ORAL | 3 refills | Status: DC

## 2020-02-01 MED ORDER — PRAVASTATIN SODIUM 20 MG PO TABS: 20.0000 mg | ORAL_TABLET | Freq: Every day | ORAL | 1 refills | Status: DC

## 2020-02-01 MED ORDER — METFORMIN HCL 850 MG PO TABS: 850.0000 mg | ORAL_TABLET | Freq: Two times a day (BID) | ORAL | 3 refills | Status: DC

## 2020-02-01 MED ORDER — METOPROLOL SUCCINATE ER 100 MG PO TB24: 100.0000 mg | ORAL_TABLET | Freq: Every day | ORAL | 3 refills | Status: DC

## 2020-02-01 NOTE — Patient Instructions (Addendum)
Let me know name of anxiety medication  Go to lab for blood draw  Start pravastatin 20mg  at bedtime to help minimize your risk for cardiovascular disease.  Call nutritionist for appt: [phone].

## 2020-02-01 NOTE — Assessment & Plan Note (Signed)
Up to date with mammogram Ordered bone density today

## 2020-02-01 NOTE — Addendum Note (Signed)
Addended by: Leana Gamer on: 02/01/2020 03:48 PM   Modules accepted: Orders

## 2020-02-01 NOTE — Assessment & Plan Note (Addendum)
Uncontrolled with glucose readings 150s-250s Current use of metformin Last HgbA1c of 7.5 Admits she is not compliant with diet and no exercise regimen.  Repeat HgbA1c today: Uncontrolled DM with hgbA1c of 10 from 7.5: increase metformin to 850mg  BID. Stable renal function Advised to schedule diabetic eye exam Start pravastatin 20mg  due to ASCVD risk of 18.5% Advised to schedule appt with nutritionist as previously discussed. Start daily exercise F/up in 78months

## 2020-02-01 NOTE — Progress Notes (Addendum)
Subjective:  Patient ID: Melissa Bray, female    DOB: 1960/10/30  Age: 59 y.o. MRN: [id number]  CC: Follow-up (3 month f/u on DM, HTN, and cholesterol. )  HPI  GAD (generalized anxiety disorder) Did not start lexapro Vistaril prescribed by colleague 37months ago Reports this has been effective Declines need for psychologist referral  Essential hypertension Slightly elevated today. Reports compliance with metoprolol, lisinopril, and clonidine BP Readings from Last 3 Encounters:  02/01/20 (!) 148/90  10/21/19 124/82  08/06/19 (!) 130/100  no headache or dizziness or CP or edema or SOB  Maintain medications Advised to maintain DASH diet and regular exercise Repeat BMP f/up in 75months  Type 2 diabetes mellitus with hyperglycemia, without long-term current use of insulin (HCC) Uncontrolled with glucose readings 150s-250s Current use of metformin Last HgbA1c of 7.5 Admits she is not compliant with diet and no exercise regimen.  Repeat HgbA1c today: Uncontrolled DM with hgbA1c of 10 from 7.5: increase metformin to 850mg  BID. Stable renal function Advised to schedule diabetic eye exam Start pravastatin 20mg  due to ASCVD risk of 18.5% Advised to schedule appt with nutritionist as previously discussed. Start daily exercise F/up in 53months  Long term (current) use of aromatase inhibitors Up to date with mammogram Ordered bone density today  Reviewed past Medical, Social and Family history today.  Outpatient Medications Prior to Visit  Medication Sig Dispense Refill  . anastrozole (ARIMIDEX) 1 MG tablet Take 1 mg by mouth daily.    . cloNIDine (CATAPRES) 0.1 MG tablet Take 1 tablet (0.1 mg total) by mouth 2 (two) times daily. Need office visit for additional refills 180 tablet 0  . hydrOXYzine (VISTARIL) 50 MG capsule TAKE 1 CAPSULE (50 MG TOTAL) BY MOUTH 3 (THREE) TIMES DAILY AS NEEDED FOR ITCHING OR ANXIETY. 60 capsule 1  . lisinopril (ZESTRIL) 20 MG tablet Take 1 tablet  (20 mg total) by mouth daily. 90 tablet 1  . metFORMIN (GLUCOPHAGE) 500 MG tablet Take 1 tablet (500 mg total) by mouth 2 (two) times daily with a meal. 180 tablet 3  . metoprolol succinate (TOPROL-XL) 100 MG 24 hr tablet Take 1 tablet (100 mg total) by mouth daily. 90 tablet 1  . escitalopram (LEXAPRO) 10 MG tablet Take 10 mg by mouth daily. (Patient not taking: Reported on 02/01/2020)     No facility-administered medications prior to visit.    ROS See HPI  Objective:  BP (!) 148/90 (BP Location: Right Arm, Patient Position: Sitting, Cuff Size: Large)   Pulse 94   Temp (!) 96.2 F (35.7 C) (Temporal)   Ht 5\' 5"  (1.651 m)   Wt 282 lb (127.9 kg)   SpO2 96%   BMI 46.93 kg/m   Physical Exam Constitutional:      Appearance: She is obese.  Cardiovascular:     Rate and Rhythm: Normal rate and regular rhythm.     Pulses: Normal pulses.     Heart sounds: Gallop present.   Pulmonary:     Effort: Pulmonary effort is normal.     Breath sounds: Normal breath sounds.  Musculoskeletal:     Right lower leg: No edema.     Left lower leg: No edema.  Skin:    General: Skin is warm and dry.  Neurological:     Mental Status: She is alert and oriented to person, place, and time.    Assessment & Plan:  This visit occurred during the SARS-CoV-2 public health emergency.  Safety protocols were in place,  including screening questions prior to the visit, additional usage of staff PPE, and extensive cleaning of exam room while observing appropriate contact time as indicated for disinfecting solutions.   Kimani was seen today for follow-up.  Diagnoses and all orders for this visit:  Type 2 diabetes mellitus with hyperglycemia, without long-term current use of insulin (HCC) -     Hemoglobin A1c -     Basic metabolic panel -     pravastatin (PRAVACHOL) 20 MG tablet; Take 1 tablet (20 mg total) by mouth daily. -     metFORMIN (GLUCOPHAGE) 850 MG tablet; Take 1 tablet (850 mg total) by mouth 2  (two) times daily with a meal.  Influenza vaccine needed -     Flu Vaccine QUAD High Dose(Fluad)  Essential hypertension -     Basic metabolic panel -     lisinopril (ZESTRIL) 20 MG tablet; Take 1 tablet (20 mg total) by mouth daily. -     metoprolol succinate (TOPROL-XL) 100 MG 24 hr tablet; Take 1 tablet (100 mg total) by mouth daily. -     cloNIDine (CATAPRES) 0.1 MG tablet; Take 1 tablet (0.1 mg total) by mouth 2 (two) times daily.  GAD (generalized anxiety disorder) -     hydrOXYzine (VISTARIL) 50 MG capsule; Take 1 capsule (50 mg total) by mouth 3 (three) times daily as needed for itching or anxiety.  Long term (current) use of aromatase inhibitors -     DG Bone Density; Future    Problem List Items Addressed This Visit      Cardiovascular and Mediastinum   Essential hypertension    Slightly elevated today. Reports compliance with metoprolol, lisinopril, and clonidine BP Readings from Last 3 Encounters:  02/01/20 (!) 148/90  10/21/19 124/82  08/06/19 (!) 130/100  no headache or dizziness or CP or edema or SOB  Maintain medications Advised to maintain DASH diet and regular exercise Repeat BMP f/up in 28months      Relevant Medications   pravastatin (PRAVACHOL) 20 MG tablet   lisinopril (ZESTRIL) 20 MG tablet   metoprolol succinate (TOPROL-XL) 100 MG 24 hr tablet   cloNIDine (CATAPRES) 0.1 MG tablet   Other Relevant Orders   Basic metabolic panel (Completed)     Endocrine   Type 2 diabetes mellitus with hyperglycemia, without long-term current use of insulin (HCC) - Primary    Uncontrolled with glucose readings 150s-250s Current use of metformin Last HgbA1c of 7.5 Admits she is not compliant with diet and no exercise regimen.  Repeat HgbA1c today: Uncontrolled DM with hgbA1c of 10 from 7.5: increase metformin to 850mg  BID. Stable renal function Advised to schedule diabetic eye exam Start pravastatin 20mg  due to ASCVD risk of 18.5% Advised to schedule appt  with nutritionist as previously discussed. Start daily exercise F/up in 3months      Relevant Medications   pravastatin (PRAVACHOL) 20 MG tablet   lisinopril (ZESTRIL) 20 MG tablet   metFORMIN (GLUCOPHAGE) 850 MG tablet   Other Relevant Orders   Hemoglobin A1c (Completed)   Basic metabolic panel (Completed)     Other   GAD (generalized anxiety disorder)    Did not start lexapro Vistaril prescribed by colleague 53months ago Reports this has been effective Declines need for psychologist referral      Relevant Medications   hydrOXYzine (VISTARIL) 50 MG capsule   Long term (current) use of aromatase inhibitors    Up to date with mammogram Ordered bone density today  Relevant Orders   DG Bone Density    Other Visit Diagnoses    Influenza vaccine needed       Relevant Orders   Flu Vaccine QUAD High Dose(Fluad) (Completed)      Follow-up: Return in about 3 months (around 05/03/2020) for DM and HTN (video or F2F).  Wilfred Lacy, NP

## 2020-02-01 NOTE — Assessment & Plan Note (Signed)
Did not start lexapro Vistaril prescribed by colleague 40months ago Reports this has been effective Declines need for psychologist referral

## 2020-02-01 NOTE — Assessment & Plan Note (Signed)
Slightly elevated today. Reports compliance with metoprolol, lisinopril, and clonidine BP Readings from Last 3 Encounters:  02/01/20 (!) 148/90  10/21/19 124/82  08/06/19 (!) 130/100  no headache or dizziness or CP or edema or SOB  Maintain medications Advised to maintain DASH diet and regular exercise Repeat BMP f/up in 82months

## 2020-04-03 DIAGNOSIS — Z9221 Personal history of antineoplastic chemotherapy: Secondary | ICD-10-CM | POA: Diagnosis not present

## 2020-04-03 DIAGNOSIS — Z170 Estrogen receptor positive status [ER+]: Secondary | ICD-10-CM | POA: Diagnosis not present

## 2020-04-03 DIAGNOSIS — C50919 Malignant neoplasm of unspecified site of unspecified female breast: Secondary | ICD-10-CM | POA: Diagnosis not present

## 2020-04-03 DIAGNOSIS — C773 Secondary and unspecified malignant neoplasm of axilla and upper limb lymph nodes: Secondary | ICD-10-CM | POA: Diagnosis not present

## 2020-04-03 DIAGNOSIS — Z923 Personal history of irradiation: Secondary | ICD-10-CM | POA: Diagnosis not present

## 2020-04-03 DIAGNOSIS — Z79811 Long term (current) use of aromatase inhibitors: Secondary | ICD-10-CM | POA: Diagnosis not present

## 2020-04-03 DIAGNOSIS — Z9012 Acquired absence of left breast and nipple: Secondary | ICD-10-CM | POA: Diagnosis not present

## 2020-04-03 DIAGNOSIS — C50812 Malignant neoplasm of overlapping sites of left female breast: Secondary | ICD-10-CM | POA: Diagnosis not present

## 2020-05-17 ENCOUNTER — Telehealth: Payer: Self-pay

## 2020-05-17 NOTE — Telephone Encounter (Signed)
Pt calling to request a change in pharmacy.  Pt would like all her prescriptions to go to [facility] on [address] in [place] at [place], [place]. Pharmacy has been changed and pt is aware.

## 2020-06-08 DIAGNOSIS — E1165 Type 2 diabetes mellitus with hyperglycemia: Secondary | ICD-10-CM | POA: Diagnosis not present

## 2020-06-08 DIAGNOSIS — N3001 Acute cystitis with hematuria: Secondary | ICD-10-CM | POA: Diagnosis not present

## 2020-06-10 DIAGNOSIS — E1165 Type 2 diabetes mellitus with hyperglycemia: Secondary | ICD-10-CM | POA: Diagnosis not present

## 2020-06-21 ENCOUNTER — Telehealth: Payer: Self-pay

## 2020-06-21 DIAGNOSIS — F411 Generalized anxiety disorder: Secondary | ICD-10-CM

## 2020-06-21 MED ORDER — HYDROXYZINE PAMOATE 50 MG PO CAPS: 50.0000 mg | ORAL_CAPSULE | Freq: Three times a day (TID) | ORAL | 2 refills | Status: DC | PRN

## 2020-06-21 MED ORDER — HYDROXYZINE PAMOATE 50 MG PO CAPS: 50.0000 mg | ORAL_CAPSULE | Freq: Three times a day (TID) | ORAL | 0 refills | Status: DC | PRN

## 2020-06-21 NOTE — Telephone Encounter (Signed)
Pt is requesting a refill for hydrOXYzine (VISTARIL) 50 MG capsule  She would like her medications sent to [facility] on [address] in [place] now.  Thank you

## 2020-06-21 NOTE — Telephone Encounter (Signed)
Last ov 01/2020 Scheduled follow up for 07/11/2020 Rx sent in

## 2020-06-21 NOTE — Addendum Note (Signed)
Addended by: Leana Gamer on: 06/21/2020 04:14 PM   Modules accepted: Orders

## 2020-07-11 ENCOUNTER — Ambulatory Visit: Payer: BC Managed Care – PPO | Admitting: Nurse Practitioner

## 2020-07-19 ENCOUNTER — Encounter: Payer: Self-pay | Admitting: Nurse Practitioner

## 2020-07-19 ENCOUNTER — Ambulatory Visit: Payer: BC Managed Care – PPO | Admitting: Nurse Practitioner

## 2020-07-19 ENCOUNTER — Other Ambulatory Visit: Payer: Self-pay

## 2020-07-19 VITALS — BP 150/87 | HR 77 | Temp 99.0°F | Ht 65.0 in | Wt 279.8 lb

## 2020-07-19 DIAGNOSIS — I10 Essential (primary) hypertension: Secondary | ICD-10-CM | POA: Diagnosis not present

## 2020-07-19 DIAGNOSIS — F411 Generalized anxiety disorder: Secondary | ICD-10-CM

## 2020-07-19 DIAGNOSIS — E1165 Type 2 diabetes mellitus with hyperglycemia: Secondary | ICD-10-CM

## 2020-07-19 LAB — POCT GLYCOSYLATED HEMOGLOBIN (HGB A1C): Hemoglobin A1C: 10.2 % — AB (ref 4.0–5.6)

## 2020-07-19 MED ORDER — DULOXETINE HCL 30 MG PO CPEP: 30.0000 mg | ORAL_CAPSULE | Freq: Every day | ORAL | 5 refills | Status: DC

## 2020-07-19 MED ORDER — LISINOPRIL 40 MG PO TABS: 40.0000 mg | ORAL_TABLET | Freq: Every day | ORAL | 3 refills | Status: DC

## 2020-07-19 MED ORDER — GLIPIZIDE 5 MG PO TABS: 5.0000 mg | ORAL_TABLET | Freq: Two times a day (BID) | ORAL | 5 refills | Status: DC

## 2020-07-19 NOTE — Assessment & Plan Note (Signed)
BP not at goal with clonidine, metoprolol and lisinopril. Reports she is compliant with medications BP Readings from Last 3 Encounters:  07/19/20 (!) 150/87  02/01/20 (!) 148/90  10/21/19 124/82   Increase lisinopril to 40mg  Advised about need for DASH diet and exercise (walking 89mins daily) F/up in 67month

## 2020-07-19 NOTE — Patient Instructions (Addendum)
Your HgbA1c of 10.2% today. Start glipizide 5mg  BID Maintain metformin dose.  Increase lisinopril to 40mg .  Start cymbalta. Let me know if you change your mind about psychology referral F/up in 22month.

## 2020-07-19 NOTE — Assessment & Plan Note (Signed)
Uncontrolled with HgbA1c at 10.2% today. Reports she is compliance with metformin  Add glipizide 5mg  BID Continue metformin dose Advised about importance of low carb/low sugar diet. F/up in 37month

## 2020-07-19 NOTE — Assessment & Plan Note (Signed)
Worsening in last 17months despite use of vistaril 2-3x/day. She thinks this is due to mother's death 25-Dec-2019. Has support from her siblings. Declined referral to therapist Agreed to start cymbalta. New rx sent F/up in 44month

## 2020-07-19 NOTE — Progress Notes (Signed)
Subjective:  Patient ID: Melissa Bray, female    DOB: 1960/12/16  Age: 60 y.o. MRN: [id number]  CC: Hypertension (Pt here for 3 month f/u visit.  Pt c/o anxiety making her Bp up.  Pt just lost her mother.  )  HPI  Essential hypertension BP not at goal with clonidine, metoprolol and lisinopril. Reports she is compliant with medications BP Readings from Last 3 Encounters:  07/19/20 (!) 150/87  02/01/20 (!) 148/90  10/21/19 124/82   Increase lisinopril to 40mg  Advised about need for DASH diet and exercise (walking 22mins daily) F/up in 48month  Type 2 diabetes mellitus with hyperglycemia, without long-term current use of insulin ([place]) Uncontrolled with HgbA1c at 10.2% today. Reports she is compliance with metformin  Add glipizide 5mg  BID Continue metformin dose Advised about importance of low carb/low sugar diet. F/up in 40month  GAD (generalized anxiety disorder) Worsening in last 50months despite use of vistaril 2-3x/day. She thinks this is due to mother's death 01-01-2020. Has support from her siblings. Declined referral to therapist Agreed to start cymbalta. New rx sent F/up in 5month  Reviewed lab results completed by urgent care provider 04/2020: CBC, urinalysis, and CMP (stable except elevated glucose)  Depression screen [facility] 2/9 07/19/2020 03/05/2019 06/10/2018  Decreased Interest 0 0 0  Down, Depressed, Hopeless 1 0 0  PHQ - 2 Score 1 0 0  Altered sleeping 2 - 1  Tired, decreased energy 2 - 0  Change in appetite 1 - 1  Feeling bad or failure about yourself  0 - 0  Trouble concentrating 0 - 0  Moving slowly or fidgety/restless 0 - 0  Suicidal thoughts 0 - 0  PHQ-9 Score 6 - 2  Difficult doing work/chores Somewhat difficult - -   GAD 7 : Generalized Anxiety Score 07/19/2020 06/10/2018  Nervous, Anxious, on Edge 3 0  Control/stop worrying 1 0  Worry too much - different things 1 0  Trouble relaxing 2 1  Restless 0 1  Easily annoyed or irritable 0 0  Afraid - awful  might happen 1 0  Total GAD 7 Score 8 2  Anxiety Difficulty Somewhat difficult -   Reviewed past Medical, Social and Family history today.  Outpatient Medications Prior to Visit  Medication Sig Dispense Refill  . anastrozole (ARIMIDEX) 1 MG tablet Take 1 mg by mouth daily.    . cloNIDine (CATAPRES) 0.1 MG tablet Take 1 tablet (0.1 mg total) by mouth 2 (two) times daily. 180 tablet 3  . hydrOXYzine (VISTARIL) 50 MG capsule Take 1 capsule (50 mg total) by mouth 3 (three) times daily as needed for itching or anxiety. No additional refills without office visit 60 capsule 0  . metFORMIN (GLUCOPHAGE) 850 MG tablet Take 1 tablet (850 mg total) by mouth 2 (two) times daily with a meal. 180 tablet 3  . metoprolol succinate (TOPROL-XL) 100 MG 24 hr tablet Take 1 tablet (100 mg total) by mouth daily. 90 tablet 3  . pravastatin (PRAVACHOL) 20 MG tablet Take 1 tablet (20 mg total) by mouth daily. 90 tablet 1  . lisinopril (ZESTRIL) 20 MG tablet Take 1 tablet (20 mg total) by mouth daily. 90 tablet 3   No facility-administered medications prior to visit.    ROS See HPI  Objective:  BP (!) 150/87 (BP Location: Right Arm, Patient Position: Sitting, Cuff Size: Large)   Pulse 77   Temp 99 F (37.2 C) (Oral)   Ht 5\' 5"  (1.651 m)   Wt 279  lb 12.8 oz (126.9 kg)   SpO2 100%   BMI 46.56 kg/m   Physical Exam Constitutional:      General: She is not in acute distress. Cardiovascular:     Rate and Rhythm: Normal rate and regular rhythm.     Pulses: Normal pulses.     Heart sounds: Normal heart sounds.  Pulmonary:     Effort: Pulmonary effort is normal.     Breath sounds: Normal breath sounds.  Musculoskeletal:     Right lower leg: No edema.     Left lower leg: No edema.  Neurological:     Mental Status: She is alert and oriented to person, place, and time.  Psychiatric:        Attention and Perception: Attention normal.        Mood and Affect: Mood is anxious.        Speech: Speech normal.         Behavior: Behavior is cooperative.        Cognition and Memory: Cognition normal.        Judgment: Judgment normal.     Assessment & Plan:  This visit occurred during the SARS-CoV-2 public health emergency.  Safety protocols were in place, including screening questions prior to the visit, additional usage of staff PPE, and extensive cleaning of exam room while observing appropriate contact time as indicated for disinfecting solutions.   Melissa Bray was seen today for hypertension.  Diagnoses and all orders for this visit:  Essential hypertension -     lisinopril (ZESTRIL) 40 MG tablet; Take 1 tablet (40 mg total) by mouth daily.  Type 2 diabetes mellitus with hyperglycemia, without long-term current use of insulin (HCC) -     POCT glycosylated hemoglobin (Hb A1C) -     Cancel: Microalbumin / creatinine urine ratio -     glipiZIDE (GLUCOTROL) 5 MG tablet; Take 1 tablet (5 mg total) by mouth 2 (two) times daily before a meal.  GAD (generalized anxiety disorder) -     DULoxetine (CYMBALTA) 30 MG capsule; Take 1 capsule (30 mg total) by mouth daily.    Problem List Items Addressed This Visit      Cardiovascular and Mediastinum   Essential hypertension - Primary    BP not at goal with clonidine, metoprolol and lisinopril. Reports she is compliant with medications BP Readings from Last 3 Encounters:  07/19/20 (!) 150/87  02/01/20 (!) 148/90  10/21/19 124/82   Increase lisinopril to 40mg  Advised about need for DASH diet and exercise (walking 27mins daily) F/up in 76month      Relevant Medications   lisinopril (ZESTRIL) 40 MG tablet     Endocrine   Type 2 diabetes mellitus with hyperglycemia, without long-term current use of insulin ([place])    Uncontrolled with HgbA1c at 10.2% today. Reports she is compliance with metformin  Add glipizide 5mg  BID Continue metformin dose Advised about importance of low carb/low sugar diet. F/up in 59month      Relevant Medications    lisinopril (ZESTRIL) 40 MG tablet   glipiZIDE (GLUCOTROL) 5 MG tablet   Other Relevant Orders   POCT glycosylated hemoglobin (Hb A1C)     Other   GAD (generalized anxiety disorder)    Worsening in last 21months despite use of vistaril 2-3x/day. She thinks this is due to mother's death 2020-01-03. Has support from her siblings. Declined referral to therapist Agreed to start cymbalta. New rx sent F/up in 49month      Relevant Medications  DULoxetine (CYMBALTA) 30 MG capsule      Follow-up: Return in about 4 weeks (around 08/16/2020) for anxiety and HTN (70mins).  Wilfred Lacy, NP

## 2020-08-09 ENCOUNTER — Ambulatory Visit
Admission: RE | Admit: 2020-08-09 | Discharge: 2020-08-09 | Disposition: A | Discharge status: Home / Self Care | Payer: BC Managed Care – PPO | Source: Ambulatory Visit | Attending: Nurse Practitioner | Admitting: Nurse Practitioner

## 2020-08-09 ENCOUNTER — Other Ambulatory Visit: Payer: Self-pay

## 2020-08-09 DIAGNOSIS — Z79811 Long term (current) use of aromatase inhibitors: Secondary | ICD-10-CM

## 2020-08-09 DIAGNOSIS — Z780 Asymptomatic menopausal state: Secondary | ICD-10-CM | POA: Diagnosis not present

## 2020-08-09 DIAGNOSIS — M8589 Other specified disorders of bone density and structure, multiple sites: Secondary | ICD-10-CM | POA: Diagnosis not present

## 2020-08-14 ENCOUNTER — Other Ambulatory Visit: Payer: Self-pay | Admitting: Nurse Practitioner

## 2020-08-14 DIAGNOSIS — Z79811 Long term (current) use of aromatase inhibitors: Secondary | ICD-10-CM

## 2020-08-14 DIAGNOSIS — M810 Age-related osteoporosis without current pathological fracture: Secondary | ICD-10-CM

## 2020-08-14 MED ORDER — ALENDRONATE SODIUM 70 MG PO TABS: 70.0000 mg | ORAL_TABLET | ORAL | 3 refills | Status: DC

## 2020-08-23 ENCOUNTER — Ambulatory Visit: Payer: BC Managed Care – PPO | Admitting: Nurse Practitioner

## 2020-08-23 LAB — HM DIABETES EYE EXAM

## 2020-09-06 ENCOUNTER — Ambulatory Visit (INDEPENDENT_AMBULATORY_CARE_PROVIDER_SITE_OTHER): Payer: BC Managed Care – PPO | Admitting: Nurse Practitioner

## 2020-09-06 ENCOUNTER — Encounter: Payer: Self-pay | Admitting: Nurse Practitioner

## 2020-09-06 ENCOUNTER — Other Ambulatory Visit: Payer: Self-pay

## 2020-09-06 VITALS — BP 120/88 | HR 100 | Temp 97.5°F | Ht 65.0 in | Wt 276.4 lb

## 2020-09-06 DIAGNOSIS — I10 Essential (primary) hypertension: Secondary | ICD-10-CM | POA: Diagnosis not present

## 2020-09-06 DIAGNOSIS — E1169 Type 2 diabetes mellitus with other specified complication: Secondary | ICD-10-CM | POA: Diagnosis not present

## 2020-09-06 DIAGNOSIS — F3341 Major depressive disorder, recurrent, in partial remission: Secondary | ICD-10-CM | POA: Diagnosis not present

## 2020-09-06 DIAGNOSIS — E66813 Obesity, class 3: Secondary | ICD-10-CM | POA: Insufficient documentation

## 2020-09-06 MED ORDER — PRAVASTATIN SODIUM 20 MG PO TABS: 20.0000 mg | ORAL_TABLET | Freq: Every day | ORAL | 3 refills | Status: DC

## 2020-09-06 MED ORDER — METFORMIN HCL 850 MG PO TABS: 850.0000 mg | ORAL_TABLET | Freq: Two times a day (BID) | ORAL | 3 refills | Status: DC

## 2020-09-06 MED ORDER — GLIPIZIDE 5 MG PO TABS: 5.0000 mg | ORAL_TABLET | Freq: Two times a day (BID) | ORAL | 1 refills | Status: DC

## 2020-09-06 MED ORDER — METOPROLOL SUCCINATE ER 100 MG PO TB24: 100.0000 mg | ORAL_TABLET | Freq: Every day | ORAL | 3 refills | Status: DC

## 2020-09-06 MED ORDER — DULOXETINE HCL 30 MG PO CPEP: 30.0000 mg | ORAL_CAPSULE | Freq: Every day | ORAL | 3 refills | Status: DC

## 2020-09-06 MED ORDER — CLONIDINE HCL 0.1 MG PO TABS: 0.1000 mg | ORAL_TABLET | Freq: Two times a day (BID) | ORAL | 3 refills | Status: DC

## 2020-09-06 NOTE — Assessment & Plan Note (Signed)
Postprandial glucose: 140-150 Current use of metformin and glipizide No hypoglycemia with glipizide  Maintain current medications Advised to check fasting glucose at least 3x/week F/up in 39months

## 2020-09-06 NOTE — Assessment & Plan Note (Signed)
>>  ASSESSMENT AND PLAN FOR MORBID OBESITY (HCC) WRITTEN ON 09/06/2020  7:54 PM BY Avamarie Crossley LUM, NP  BMI of 46 Improving glucose control BP at goal Referred to nutritionist but she did not schedule appt. Advised about importance of lifestyle changes through diet and exercise to promote weight loss and improve glucose control  Wt Readings from Last 3 Encounters:  09/06/20 276 lb 6.4 oz (125.4 kg)  07/19/20 279 lb 12.8 oz (126.9 kg)  02/01/20 282 lb (127.9 kg)

## 2020-09-06 NOTE — Progress Notes (Signed)
Subjective:  Patient ID: Melissa Bray, female    DOB: March 10, 1961  Age: 60 y.o. MRN: [id number]  CC: Follow-up (4 week f/u on anxiety and HTN. )  HPI  Depression, recurrent ([place]) Improved with cymbalta Denies any adverse side effects Refill sent  Type 2 diabetes mellitus with hyperglycemia, without long-term current use of insulin (HCC) Postprandial glucose: 140-150 Current use of metformin and glipizide No hypoglycemia with glipizide  Maintain current medications Advised to check fasting glucose at least 3x/week F/up in 32months   Essential hypertension Improved BP at at goal BP Readings from Last 3 Encounters:  09/06/20 120/88  07/19/20 (!) 150/87  02/01/20 (!) 148/90   Maintain current medications Refills sent  Morbid obesity ([place]) BMI of 46 Improving glucose control BP at goal Referred to nutritionist but she did not schedule appt. Advised about importance of lifestyle changes through diet and exercise to promote weight loss and improve glucose control  Wt Readings from Last 3 Encounters:  09/06/20 276 lb 6.4 oz (125.4 kg)  07/19/20 279 lb 12.8 oz (126.9 kg)  02/01/20 282 lb (127.9 kg)     Depression screen [facility] 2/9 09/06/2020 07/19/2020 03/05/2019  Decreased Interest 0 0 0  Down, Depressed, Hopeless 0 1 0  PHQ - 2 Score 0 1 0  Altered sleeping 1 2 -  Tired, decreased energy 0 2 -  Change in appetite 0 1 -  Feeling bad or failure about yourself  0 0 -  Trouble concentrating 0 0 -  Moving slowly or fidgety/restless 0 0 -  Suicidal thoughts 0 0 -  PHQ-9 Score 1 6 -  Difficult doing work/chores Not difficult at all Somewhat difficult -   GAD 7 : Generalized Anxiety Score 09/06/2020 07/19/2020 06/10/2018  Nervous, Anxious, on Edge 0 3 0  Control/stop worrying 0 1 0  Worry too much - different things 0 1 0  Trouble relaxing 0 2 1  Restless 0 0 1  Easily annoyed or irritable 0 0 0  Afraid - awful might happen 0 1 0  Total GAD 7 Score 0 8 2  Anxiety  Difficulty - Somewhat difficult -   Reviewed past Medical, Social and Family history today.  Outpatient Medications Prior to Visit  Medication Sig Dispense Refill  . alendronate (FOSAMAX) 70 MG tablet Take 1 tablet (70 mg total) by mouth once a week. Take with a full glass of water on an empty stomach. 12 tablet 3  . anastrozole (ARIMIDEX) 1 MG tablet Take 1 mg by mouth daily.    . hydrOXYzine (VISTARIL) 50 MG capsule Take 1 capsule (50 mg total) by mouth 3 (three) times daily as needed for itching or anxiety. No additional refills without office visit 60 capsule 0  . lisinopril (ZESTRIL) 40 MG tablet Take 1 tablet (40 mg total) by mouth daily. 90 tablet 3  . cloNIDine (CATAPRES) 0.1 MG tablet Take 1 tablet (0.1 mg total) by mouth 2 (two) times daily. 180 tablet 3  . DULoxetine (CYMBALTA) 30 MG capsule Take 1 capsule (30 mg total) by mouth daily. 30 capsule 5  . glipiZIDE (GLUCOTROL) 5 MG tablet Take 1 tablet (5 mg total) by mouth 2 (two) times daily before a meal. 60 tablet 5  . metFORMIN (GLUCOPHAGE) 850 MG tablet Take 1 tablet (850 mg total) by mouth 2 (two) times daily with a meal. 180 tablet 3  . metoprolol succinate (TOPROL-XL) 100 MG 24 hr tablet Take 1 tablet (100 mg total) by mouth daily.  90 tablet 3  . pravastatin (PRAVACHOL) 20 MG tablet Take 1 tablet (20 mg total) by mouth daily. 90 tablet 1   No facility-administered medications prior to visit.    ROS See HPI  Objective:  BP 120/88 (BP Location: Right Arm, Patient Position: Sitting, Cuff Size: Large)   Pulse 100   Temp (!) 97.5 F (36.4 C) (Temporal)   Ht 5\' 5"  (1.651 m)   Wt 276 lb 6.4 oz (125.4 kg)   SpO2 98%   BMI 46.00 kg/m   Physical Exam Constitutional:      Appearance: She is obese.  Cardiovascular:     Rate and Rhythm: Normal rate and regular rhythm.     Pulses: Normal pulses.     Heart sounds: Normal heart sounds.  Pulmonary:     Effort: Pulmonary effort is normal.     Breath sounds: Normal breath  sounds.  Musculoskeletal:     Cervical back: Normal range of motion and neck supple.     Right lower leg: No edema.     Left lower leg: No edema.  Neurological:     Mental Status: She is alert and oriented to person, place, and time.     Assessment & Plan:  This visit occurred during the SARS-CoV-2 public health emergency.  Safety protocols were in place, including screening questions prior to the visit, additional usage of staff PPE, and extensive cleaning of exam room while observing appropriate contact time as indicated for disinfecting solutions.   Melissa Bray was seen today for follow-up.  Diagnoses and all orders for this visit:  Essential hypertension -     cloNIDine (CATAPRES) 0.1 MG tablet; Take 1 tablet (0.1 mg total) by mouth 2 (two) times daily. -     metoprolol succinate (TOPROL-XL) 100 MG 24 hr tablet; Take 1 tablet (100 mg total) by mouth daily.  Type 2 diabetes mellitus with other specified complication, without long-term current use of insulin (HCC) -     glipiZIDE (GLUCOTROL) 5 MG tablet; Take 1 tablet (5 mg total) by mouth 2 (two) times daily before a meal. -     metFORMIN (GLUCOPHAGE) 850 MG tablet; Take 1 tablet (850 mg total) by mouth 2 (two) times daily with a meal. -     pravastatin (PRAVACHOL) 20 MG tablet; Take 1 tablet (20 mg total) by mouth daily.  Recurrent major depressive disorder, in partial remission (HCC) -     DULoxetine (CYMBALTA) 30 MG capsule; Take 1 capsule (30 mg total) by mouth daily.  Morbid obesity ([place])  Call [facility] to schedule appt for colonoscopy: [phone]  Problem List Items Addressed This Visit      Cardiovascular and Mediastinum   Essential hypertension - Primary    Improved BP at at goal BP Readings from Last 3 Encounters:  09/06/20 120/88  07/19/20 (!) 150/87  02/01/20 (!) 148/90   Maintain current medications Refills sent      Relevant Medications   cloNIDine (CATAPRES) 0.1 MG tablet   metoprolol succinate  (TOPROL-XL) 100 MG 24 hr tablet   pravastatin (PRAVACHOL) 20 MG tablet     Endocrine   Type 2 diabetes mellitus with hyperglycemia, without long-term current use of insulin (HCC)    Postprandial glucose: 140-150 Current use of metformin and glipizide No hypoglycemia with glipizide  Maintain current medications Advised to check fasting glucose at least 3x/week F/up in 90months       Relevant Medications   glipiZIDE (GLUCOTROL) 5 MG tablet  metFORMIN (GLUCOPHAGE) 850 MG tablet   pravastatin (PRAVACHOL) 20 MG tablet     Other   Depression, recurrent (HCC)    Improved with cymbalta Denies any adverse side effects Refill sent      Relevant Medications   DULoxetine (CYMBALTA) 30 MG capsule   Morbid obesity (HCC)    BMI of 46 Improving glucose control BP at goal Referred to nutritionist but she did not schedule appt. Advised about importance of lifestyle changes through diet and exercise to promote weight loss and improve glucose control  Wt Readings from Last 3 Encounters:  09/06/20 276 lb 6.4 oz (125.4 kg)  07/19/20 279 lb 12.8 oz (126.9 kg)  02/01/20 282 lb (127.9 kg)         Relevant Medications   glipiZIDE (GLUCOTROL) 5 MG tablet   metFORMIN (GLUCOPHAGE) 850 MG tablet      Follow-up: Return in about 3 months (around 12/07/2020) for CPE (fasting, need PAP).  Wilfred Lacy, NP

## 2020-09-06 NOTE — Assessment & Plan Note (Signed)
Improved BP at at goal BP Readings from Last 3 Encounters:  09/06/20 120/88  07/19/20 (!) 150/87  02/01/20 (!) 148/90   Maintain current medications Refills sent

## 2020-09-06 NOTE — Assessment & Plan Note (Signed)
Improved with cymbalta Denies any adverse side effects Refill sent

## 2020-09-06 NOTE — Patient Instructions (Signed)
Call [facility] to schedule appt for colonoscopy: [phone]  Maintain current medications Check fasting glucose 3x/week.

## 2020-09-06 NOTE — Assessment & Plan Note (Addendum)
BMI of 46 Improving glucose control BP at goal Referred to nutritionist but she did not schedule appt. Advised about importance of lifestyle changes through diet and exercise to promote weight loss and improve glucose control  Wt Readings from Last 3 Encounters:  09/06/20 276 lb 6.4 oz (125.4 kg)  07/19/20 279 lb 12.8 oz (126.9 kg)  02/01/20 282 lb (127.9 kg)

## 2020-10-14 ENCOUNTER — Emergency Department (HOSPITAL_BASED_OUTPATIENT_CLINIC_OR_DEPARTMENT_OTHER)
Admission: EM | Admit: 2020-10-14 | Discharge: 2020-10-14 | Disposition: A | Discharge status: Home / Self Care | Payer: BC Managed Care – PPO | Attending: Emergency Medicine | Admitting: Emergency Medicine

## 2020-10-14 ENCOUNTER — Encounter (HOSPITAL_BASED_OUTPATIENT_CLINIC_OR_DEPARTMENT_OTHER): Payer: Self-pay

## 2020-10-14 ENCOUNTER — Other Ambulatory Visit: Payer: Self-pay

## 2020-10-14 DIAGNOSIS — E119 Type 2 diabetes mellitus without complications: Secondary | ICD-10-CM | POA: Insufficient documentation

## 2020-10-14 DIAGNOSIS — S0993XA Unspecified injury of face, initial encounter: Secondary | ICD-10-CM | POA: Diagnosis not present

## 2020-10-14 DIAGNOSIS — X58XXXA Exposure to other specified factors, initial encounter: Secondary | ICD-10-CM | POA: Diagnosis not present

## 2020-10-14 DIAGNOSIS — Z79899 Other long term (current) drug therapy: Secondary | ICD-10-CM | POA: Diagnosis not present

## 2020-10-14 DIAGNOSIS — K047 Periapical abscess without sinus: Secondary | ICD-10-CM | POA: Diagnosis not present

## 2020-10-14 DIAGNOSIS — S00532A Contusion of oral cavity, initial encounter: Secondary | ICD-10-CM | POA: Diagnosis not present

## 2020-10-14 DIAGNOSIS — Z853 Personal history of malignant neoplasm of breast: Secondary | ICD-10-CM | POA: Diagnosis not present

## 2020-10-14 DIAGNOSIS — Z7984 Long term (current) use of oral hypoglycemic drugs: Secondary | ICD-10-CM | POA: Insufficient documentation

## 2020-10-14 DIAGNOSIS — I10 Essential (primary) hypertension: Secondary | ICD-10-CM | POA: Diagnosis not present

## 2020-10-14 DIAGNOSIS — K08109 Complete loss of teeth, unspecified cause, unspecified class: Secondary | ICD-10-CM | POA: Insufficient documentation

## 2020-10-14 MED ORDER — HYDROCODONE-ACETAMINOPHEN 5-325 MG PO TABS: 1.0000 | ORAL_TABLET | Freq: Four times a day (QID) | ORAL | 0 refills | Status: DC | PRN

## 2020-10-14 MED ORDER — AMOXICILLIN-POT CLAVULANATE 875-125 MG PO TABS
1.0000 | ORAL_TABLET | Freq: Once | ORAL | Status: AC
  Administered 2020-10-14: 1 via ORAL
  Filled 2020-10-14: qty 1

## 2020-10-14 MED ORDER — HYDROCODONE-ACETAMINOPHEN 5-325 MG PO TABS
1.0000 | ORAL_TABLET | Freq: Once | ORAL | Status: AC
  Administered 2020-10-14: 1 via ORAL
  Filled 2020-10-14: qty 1

## 2020-10-14 MED ORDER — AMOXICILLIN-POT CLAVULANATE 875-125 MG PO TABS: 1.0000 | ORAL_TABLET | Freq: Two times a day (BID) | ORAL | 0 refills | Status: DC

## 2020-10-14 NOTE — ED Provider Notes (Signed)
[facility] Provider Note   CSN: [id number] Arrival date & time: 10/14/20  1650     History Chief Complaint  Patient presents with   Dental Pain    Jahnai Stapleton is a 60 y.o. female hx of DM, breast cancer s/p radiation, here presenting with dental pain.  Patient has dental pain for the last 2 days.  She states that after cancer treatment, she has been having poor dentition.  She also states that some of her tooth was broken and she was supposed to see an oral surgeon for extraction.  Patient denies any fevers.   The history is provided by the patient.      Past Medical History:  Diagnosis Date   Breast cancer ([place])    Diabetes mellitus without complication ([place])    Hypertension     Patient Active Problem List   Diagnosis Date Noted   Morbid obesity ([place]) 09/06/2020   Type 2 diabetes mellitus with hyperglycemia, without long-term current use of insulin ([place]) 07/15/2019   Neuropathy due to chemotherapeutic drug ([place]) 02/02/2018   Hematuria 02/02/2018   Macular drusen, bilateral 07/09/2017   GERD (gastroesophageal reflux disease) 04/28/2017   Long term (current) use of aromatase inhibitors 11/13/2015   Depression, recurrent ([place]) 09/22/2014   Essential hypertension 09/22/2014   Breast cancer, stage 3 ([place]) 08/31/2014    Past Surgical History:  Procedure Laterality Date   ABDOMINAL HYSTERECTOMY     BREAST BIOPSY     MASTECTOMY Left 2016     OB History   No obstetric history on file.     Family History  Problem Relation Age of Onset   Asthma Mother    Cancer Father        prostate cancer   Diabetes Father    Hypertension Father    Cancer Brother        prostate cancer   Asthma Brother     Social History   Tobacco Use   Smoking status: Never   Smokeless tobacco: Never  Vaping Use   Vaping Use: Never used  Substance Use Topics   Alcohol use: Never   Drug use: Never    Home Medications Prior to Admission medications    Medication Sig Start Date End Date Taking? Authorizing Provider  alendronate (FOSAMAX) 70 MG tablet Take 1 tablet (70 mg total) by mouth once a week. Take with a full glass of water on an empty stomach. 08/14/20   Nche, Charlene Brooke, NP  anastrozole (ARIMIDEX) 1 MG tablet Take 1 mg by mouth daily.    [provider]  cloNIDine (CATAPRES) 0.1 MG tablet Take 1 tablet (0.1 mg total) by mouth 2 (two) times daily. 09/06/20   Nche, Charlene Brooke, NP  DULoxetine (CYMBALTA) 30 MG capsule Take 1 capsule (30 mg total) by mouth daily. 09/06/20   Nche, Charlene Brooke, NP  glipiZIDE (GLUCOTROL) 5 MG tablet Take 1 tablet (5 mg total) by mouth 2 (two) times daily before a meal. 09/06/20   Nche, Charlene Brooke, NP  hydrOXYzine (VISTARIL) 50 MG capsule Take 1 capsule (50 mg total) by mouth 3 (three) times daily as needed for itching or anxiety. No additional refills without office visit 06/21/20   Nche, Charlene Brooke, NP  lisinopril (ZESTRIL) 40 MG tablet Take 1 tablet (40 mg total) by mouth daily. 07/19/20   Nche, Charlene Brooke, NP  metFORMIN (GLUCOPHAGE) 850 MG tablet Take 1 tablet (850 mg total) by mouth 2 (two) times daily with a  meal. 09/06/20   Nche, Charlene Brooke, NP  metoprolol succinate (TOPROL-XL) 100 MG 24 hr tablet Take 1 tablet (100 mg total) by mouth daily. 09/06/20   Nche, Charlene Brooke, NP  pravastatin (PRAVACHOL) 20 MG tablet Take 1 tablet (20 mg total) by mouth daily. 09/06/20   Nche, Charlene Brooke, NP    Allergies    Patient has no known allergies.  Review of Systems   Review of Systems  HENT:  Positive for dental problem.   All other systems reviewed and are negative.  Physical Exam Updated Vital Signs BP 136/83 (BP Location: Right Arm)   Pulse (!) 103   Temp 98.7 F (37.1 C) (Oral)   Resp 20   Ht 5\' 5"  (1.651 m)   Wt 117.9 kg   SpO2 99%   BMI 43.27 kg/m   Physical Exam Vitals and nursing note reviewed.  Constitutional:      Appearance: Normal appearance.  HENT:     Head:  Normocephalic.     Nose: Nose normal.     Mouth/Throat:     Mouth: Mucous membranes are moist.      Comments: Patient has a rotted tooth in this area.  There is some swelling in the gum but no obvious periapical abscess.  Patient is also missing multiple teeth. Eyes:     Extraocular Movements: Extraocular movements intact.     Pupils: Pupils are equal, round, and reactive to light.  Cardiovascular:     Rate and Rhythm: Normal rate and regular rhythm.     Pulses: Normal pulses.     Heart sounds: Normal heart sounds.  Pulmonary:     Effort: Pulmonary effort is normal.     Breath sounds: Normal breath sounds.  Abdominal:     General: Abdomen is flat.     Palpations: Abdomen is soft.  Musculoskeletal:        General: Normal range of motion.     Cervical back: Normal range of motion and neck supple.  Skin:    General: Skin is warm.     Capillary Refill: Capillary refill takes less than 2 seconds.  Neurological:     General: No focal deficit present.     Mental Status: She is alert and oriented to person, place, and time.  Psychiatric:        Mood and Affect: Mood normal.        Behavior: Behavior normal.    ED Results / Procedures / Treatments   Labs (all labs ordered are listed, but only abnormal results are displayed) Labs Reviewed - No data to display  EKG None  Radiology No results found.  Procedures Procedures   Medications Ordered in ED Medications  amoxicillin-clavulanate (AUGMENTIN) 875-125 MG per tablet 1 tablet (has no administration in time range)  HYDROcodone-acetaminophen (NORCO/VICODIN) 5-325 MG per tablet 1 tablet (has no administration in time range)    ED Course  I have reviewed the triage vital signs and the nursing notes.  Pertinent labs & imaging results that were available during my care of the patient were reviewed by me and considered in my medical decision making (see chart for details).    MDM Rules/Calculators/A&P                          Sharai Wolter is a 59 y.o. female here presenting with left upper dental pain. Patient has a rotted tooth that likely has some early inflammation versus infection.  Patient  already has oral surgeon follow-up.  We will give a course of antibiotics and some pain medicine to go home with.    Final Clinical Impression(s) / ED Diagnoses Final diagnoses:  None    Rx / DC Orders ED Discharge Orders     None        Drenda Freeze, MD 10/14/20 1736

## 2020-10-14 NOTE — ED Triage Notes (Addendum)
Pt c/o dental pain to the top L side x 2 days. Pt states she has some broken teeth in that area. Pt has taken [facility] Powder with some relief. Pt does has seen a dentist and is waiting on an oral surgeon. Pt needs resources for same.

## 2020-10-14 NOTE — Discharge Instructions (Addendum)
Take augmentin twice daily for a week   Take motrin for pain   Take vicodin for severe pain. Do NOT drive with it   Call your oral surgeon for follow up   Return to ER if you have worse dental pain, trouble swallowing, fever

## 2020-10-27 DIAGNOSIS — E1165 Type 2 diabetes mellitus with hyperglycemia: Secondary | ICD-10-CM | POA: Diagnosis not present

## 2020-11-14 DIAGNOSIS — Z1239 Encounter for other screening for malignant neoplasm of breast: Secondary | ICD-10-CM | POA: Diagnosis not present

## 2020-11-14 DIAGNOSIS — Z1231 Encounter for screening mammogram for malignant neoplasm of breast: Secondary | ICD-10-CM | POA: Diagnosis not present

## 2020-11-14 LAB — HM MAMMOGRAPHY

## 2020-12-03 ENCOUNTER — Other Ambulatory Visit: Payer: Self-pay | Admitting: Nurse Practitioner

## 2020-12-03 DIAGNOSIS — E1169 Type 2 diabetes mellitus with other specified complication: Secondary | ICD-10-CM

## 2020-12-07 ENCOUNTER — Encounter: Payer: BC Managed Care – PPO | Admitting: Nurse Practitioner

## 2020-12-15 ENCOUNTER — Other Ambulatory Visit: Payer: Self-pay

## 2020-12-15 ENCOUNTER — Encounter: Payer: BC Managed Care – PPO | Admitting: Nurse Practitioner

## 2020-12-15 ENCOUNTER — Telehealth: Payer: Self-pay | Admitting: Nurse Practitioner

## 2020-12-15 NOTE — Telephone Encounter (Signed)
Pt walked in today and canceled her appt and said she did not need a CPE and that she had no reason to be seen today so appt was canceled. Baldo Ash was made aware and she said that pt needs to be seen for f/u anxiety & blood sugar checkup. I called and lvm for pt to call back

## 2021-02-28 DIAGNOSIS — X500XXA Overexertion from strenuous movement or load, initial encounter: Secondary | ICD-10-CM | POA: Diagnosis not present

## 2021-02-28 DIAGNOSIS — I209 Angina pectoris, unspecified: Secondary | ICD-10-CM | POA: Diagnosis not present

## 2021-02-28 DIAGNOSIS — Y999 Unspecified external cause status: Secondary | ICD-10-CM | POA: Diagnosis not present

## 2021-02-28 DIAGNOSIS — I440 Atrioventricular block, first degree: Secondary | ICD-10-CM | POA: Diagnosis not present

## 2021-02-28 DIAGNOSIS — S46912A Strain of unspecified muscle, fascia and tendon at shoulder and upper arm level, left arm, initial encounter: Secondary | ICD-10-CM | POA: Diagnosis not present

## 2021-02-28 DIAGNOSIS — M25512 Pain in left shoulder: Secondary | ICD-10-CM | POA: Diagnosis not present

## 2021-03-05 ENCOUNTER — Other Ambulatory Visit: Payer: Self-pay

## 2021-03-05 ENCOUNTER — Encounter (HOSPITAL_BASED_OUTPATIENT_CLINIC_OR_DEPARTMENT_OTHER): Payer: Self-pay | Admitting: Emergency Medicine

## 2021-03-05 ENCOUNTER — Emergency Department (HOSPITAL_BASED_OUTPATIENT_CLINIC_OR_DEPARTMENT_OTHER)
Admission: EM | Admit: 2021-03-05 | Discharge: 2021-03-05 | Disposition: A | Discharge status: Home / Self Care | Payer: BC Managed Care – PPO | Attending: Emergency Medicine | Admitting: Emergency Medicine

## 2021-03-05 DIAGNOSIS — Z853 Personal history of malignant neoplasm of breast: Secondary | ICD-10-CM | POA: Diagnosis not present

## 2021-03-05 DIAGNOSIS — Z7984 Long term (current) use of oral hypoglycemic drugs: Secondary | ICD-10-CM | POA: Insufficient documentation

## 2021-03-05 DIAGNOSIS — Z79899 Other long term (current) drug therapy: Secondary | ICD-10-CM | POA: Insufficient documentation

## 2021-03-05 DIAGNOSIS — E119 Type 2 diabetes mellitus without complications: Secondary | ICD-10-CM | POA: Insufficient documentation

## 2021-03-05 DIAGNOSIS — M25512 Pain in left shoulder: Secondary | ICD-10-CM | POA: Insufficient documentation

## 2021-03-05 DIAGNOSIS — I10 Essential (primary) hypertension: Secondary | ICD-10-CM | POA: Insufficient documentation

## 2021-03-05 MED ORDER — KETOROLAC TROMETHAMINE 60 MG/2ML IM SOLN
60.0000 mg | Freq: Once | INTRAMUSCULAR | Status: AC
  Administered 2021-03-05: 60 mg via INTRAMUSCULAR
  Filled 2021-03-05: qty 2

## 2021-03-05 NOTE — ED Triage Notes (Signed)
Pt arrives pov, c/o left shoulder pain radiating down arm. Pt denies CP. Endorses ekg at [facility] x 3 days. Denies shob. Reports prescribed meds not working. Reports possible injury at work. Tenderness noted

## 2021-03-05 NOTE — ED Provider Notes (Signed)
[facility] Provider Note   CSN: [id number] Arrival date & time: 03/05/21  1012     History Chief Complaint  Patient presents with   Arm Pain    Melissa Bray is a 60 y.o. female.  The history is provided by the patient.  Shoulder Pain Location:  Shoulder Shoulder location:  L shoulder Pain details:    Quality:  Aching   Severity:  Mild   Onset quality:  Gradual   Timing:  Intermittent   Progression:  Waxing and waning Relieved by:  Nothing Worsened by:  Nothing Associated symptoms: no back pain, no decreased range of motion, no fatigue, no fever, no muscle weakness, no neck pain, no numbness and no stiffness       Past Medical History:  Diagnosis Date   Breast cancer ([place])    Diabetes mellitus without complication ([place])    Hypertension     Patient Active Problem List   Diagnosis Date Noted   Morbid obesity ([place]) 09/06/2020   Type 2 diabetes mellitus with hyperglycemia, without long-term current use of insulin ([place]) 07/15/2019   Neuropathy due to chemotherapeutic drug ([place]) 02/02/2018   Hematuria 02/02/2018   Macular drusen, bilateral 07/09/2017   GERD (gastroesophageal reflux disease) 04/28/2017   Long term (current) use of aromatase inhibitors 11/13/2015   Depression, recurrent ([place]) 09/22/2014   Essential hypertension 09/22/2014   Breast cancer, stage 3 ([place]) 08/31/2014    Past Surgical History:  Procedure Laterality Date   ABDOMINAL HYSTERECTOMY     BREAST BIOPSY     MASTECTOMY Left 2016     OB History   No obstetric history on file.     Family History  Problem Relation Age of Onset   Asthma Mother    Cancer Father        prostate cancer   Diabetes Father    Hypertension Father    Cancer Brother        prostate cancer   Asthma Brother     Social History   Tobacco Use   Smoking status: Never   Smokeless tobacco: Never  Vaping Use   Vaping Use: Never used  Substance Use Topics   Alcohol use: Never    Drug use: Never    Home Medications Prior to Admission medications   Medication Sig Start Date End Date Taking? Authorizing Provider  alendronate (FOSAMAX) 70 MG tablet Take 1 tablet (70 mg total) by mouth once a week. Take with a full glass of water on an empty stomach. 08/14/20   Nche, Charlene Brooke, NP  amoxicillin-clavulanate (AUGMENTIN) 875-125 MG tablet Take 1 tablet by mouth 2 (two) times daily. One po bid x 7 days 10/14/20   Drenda Freeze, MD  anastrozole (ARIMIDEX) 1 MG tablet Take 1 mg by mouth daily.    [provider]  cloNIDine (CATAPRES) 0.1 MG tablet Take 1 tablet (0.1 mg total) by mouth 2 (two) times daily. 09/06/20   Nche, Charlene Brooke, NP  DULoxetine (CYMBALTA) 30 MG capsule Take 1 capsule (30 mg total) by mouth daily. 09/06/20   Nche, Charlene Brooke, NP  glipiZIDE (GLUCOTROL) 5 MG tablet Take 1 tablet (5 mg total) by mouth 2 (two) times daily before a meal. 09/06/20   Nche, Charlene Brooke, NP  HYDROcodone-acetaminophen (NORCO/VICODIN) 5-325 MG tablet Take 1 tablet by mouth every 6 (six) hours as needed. 10/14/20   Drenda Freeze, MD  hydrOXYzine (VISTARIL) 50 MG capsule Take 1 capsule (50 mg total) by mouth  3 (three) times daily as needed for itching or anxiety. No additional refills without office visit 06/21/20   Nche, Charlene Brooke, NP  lisinopril (ZESTRIL) 40 MG tablet Take 1 tablet (40 mg total) by mouth daily. 07/19/20   Nche, Charlene Brooke, NP  metFORMIN (GLUCOPHAGE) 850 MG tablet Take 1 tablet (850 mg total) by mouth 2 (two) times daily with a meal. 09/06/20   Nche, Charlene Brooke, NP  metoprolol succinate (TOPROL-XL) 100 MG 24 hr tablet Take 1 tablet (100 mg total) by mouth daily. 09/06/20   Nche, Charlene Brooke, NP  pravastatin (PRAVACHOL) 20 MG tablet TAKE 1 TABLET BY MOUTH EVERY DAY 12/05/20   Nche, Charlene Brooke, NP    Allergies    Patient has no known allergies.  Review of Systems   Review of Systems  Constitutional:  Negative for fatigue and fever.   Musculoskeletal:  Positive for arthralgias. Negative for back pain, neck pain, neck stiffness and stiffness.  Skin:  Negative for color change and wound.  Neurological:  Negative for weakness and numbness.   Physical Exam Updated Vital Signs BP (!) 134/104 (BP Location: Right Arm)   Pulse (!) 108   Temp 98.4 F (36.9 C) (Oral)   Resp 20   Ht 5\' 5"  (1.651 m)   Wt 122.5 kg   SpO2 99%   BMI 44.93 kg/m   Physical Exam Constitutional:      General: She is not in acute distress.    Appearance: She is not ill-appearing.  Cardiovascular:     Pulses: Normal pulses.  Musculoskeletal:        General: Tenderness present. Normal range of motion.     Comments: Normal range of motion to the left shoulder area but with discomfort and tenderness to palpation and with movement, no obvious deformity  Neurological:     Mental Status: She is alert.     Sensory: No sensory deficit.     Motor: No weakness.    ED Results / Procedures / Treatments   Labs (all labs ordered are listed, but only abnormal results are displayed) Labs Reviewed - No data to display  EKG EKG Interpretation  Date/Time:  Monday March 05 2021 10:32:32 EST Ventricular Rate:  110 PR Interval:  192 QRS Duration: 78 QT Interval:  330 QTC Calculation: 446 R Axis:   7 Text Interpretation: Sinus tachycardia Confirmed by Ronnald Nian, Lynisha Osuch ([id number]) on 03/05/2021 10:42:01 AM  Radiology No results found.  Procedures Procedures   Medications Ordered in ED Medications  ketorolac (TORADOL) injection 60 mg (has no administration in time range)    ED Course  I have reviewed the triage vital signs and the nursing notes.  Pertinent labs & imaging results that were available during my care of the patient were reviewed by me and considered in my medical decision making (see chart for details).    MDM Rules/Calculators/A&P                           Melissa Bray is here with ongoing left shoulder pain.  X-rays last week  showed no fracture but did show small joint effusion.  Suspect a strain/inflammation.  Will place in a sling.  Recommend Tylenol, ibuprofen.  Written off for light duty at work.  We will have her follow-up with sports medicine.  Neurovascularly neuromuscularly intact otherwise.  No concern for infectious process.  Discharged in good condition.  This chart was dictated using voice recognition software.  Despite best efforts to proofread,  errors can occur which can change the documentation meaning.   Final Clinical Impression(s) / ED Diagnoses Final diagnoses:  Acute pain of left shoulder    Rx / DC Orders ED Discharge Orders     None        Lennice Sites, DO 03/05/21 1101

## 2021-03-05 NOTE — Discharge Instructions (Signed)
Recommend 1000 mg of Tylenol every 6 hours as needed for pain.  Recommend 800 mg ibuprofen every 8 hours as needed for pain.  Wear the sling for comfort.  Follow-up with sports medicine.  Recommend ice.

## 2021-03-12 ENCOUNTER — Other Ambulatory Visit: Payer: Self-pay | Admitting: Nurse Practitioner

## 2021-03-12 DIAGNOSIS — I10 Essential (primary) hypertension: Secondary | ICD-10-CM

## 2021-03-15 ENCOUNTER — Telehealth: Payer: Self-pay | Admitting: Nurse Practitioner

## 2021-03-15 ENCOUNTER — Encounter: Payer: Self-pay | Admitting: Nurse Practitioner

## 2021-03-15 DIAGNOSIS — I10 Essential (primary) hypertension: Secondary | ICD-10-CM

## 2021-03-19 ENCOUNTER — Telehealth: Payer: Self-pay | Admitting: Nurse Practitioner

## 2021-03-19 MED ORDER — LISINOPRIL 40 MG PO TABS: 40.0000 mg | ORAL_TABLET | Freq: Every day | ORAL | 1 refills | Status: DC

## 2021-03-19 NOTE — Telephone Encounter (Signed)
Pt called said we called her. I looked in notes & I don't see any notes about [facility] calling pt. On 03/19/21--KR

## 2021-03-19 NOTE — Telephone Encounter (Signed)
Chart supports Rx, medication sent to pharmacy. Last seen 09/06/20 LVM informing patient she needs follow up appointment to continue with refills.

## 2021-03-19 NOTE — Telephone Encounter (Signed)
Pt needs f/u appointment scheduled, noted on 03/15/21 encounter. Can you please schedule appointment for patient?

## 2021-04-12 DIAGNOSIS — M7582 Other shoulder lesions, left shoulder: Secondary | ICD-10-CM | POA: Diagnosis not present

## 2021-04-26 ENCOUNTER — Other Ambulatory Visit: Payer: Self-pay | Admitting: Nurse Practitioner

## 2021-04-26 DIAGNOSIS — E1169 Type 2 diabetes mellitus with other specified complication: Secondary | ICD-10-CM

## 2021-05-02 ENCOUNTER — Ambulatory Visit: Payer: BC Managed Care – PPO | Admitting: Nurse Practitioner

## 2021-05-02 ENCOUNTER — Other Ambulatory Visit: Payer: Self-pay

## 2021-05-02 ENCOUNTER — Encounter: Payer: Self-pay | Admitting: Nurse Practitioner

## 2021-05-02 VITALS — BP 120/84 | HR 110 | Temp 97.0°F | Ht 65.0 in | Wt 271.0 lb

## 2021-05-02 DIAGNOSIS — E1165 Type 2 diabetes mellitus with hyperglycemia: Secondary | ICD-10-CM | POA: Diagnosis not present

## 2021-05-02 DIAGNOSIS — E1169 Type 2 diabetes mellitus with other specified complication: Secondary | ICD-10-CM

## 2021-05-02 DIAGNOSIS — F339 Major depressive disorder, recurrent, unspecified: Secondary | ICD-10-CM | POA: Diagnosis not present

## 2021-05-02 DIAGNOSIS — Z136 Encounter for screening for cardiovascular disorders: Secondary | ICD-10-CM | POA: Diagnosis not present

## 2021-05-02 DIAGNOSIS — Z13220 Encounter for screening for lipoid disorders: Secondary | ICD-10-CM | POA: Diagnosis not present

## 2021-05-02 DIAGNOSIS — Z1211 Encounter for screening for malignant neoplasm of colon: Secondary | ICD-10-CM

## 2021-05-02 DIAGNOSIS — I10 Essential (primary) hypertension: Secondary | ICD-10-CM | POA: Diagnosis not present

## 2021-05-02 LAB — HEPATIC FUNCTION PANEL
ALT: 39 U/L — ABNORMAL HIGH (ref 0–35)
AST: 24 U/L (ref 0–37)
Albumin: 4.1 g/dL (ref 3.5–5.2)
Alkaline Phosphatase: 60 U/L (ref 39–117)
Bilirubin, Direct: 0.1 mg/dL (ref 0.0–0.3)
Total Bilirubin: 0.5 mg/dL (ref 0.2–1.2)
Total Protein: 7.4 g/dL (ref 6.0–8.3)

## 2021-05-02 LAB — LIPID PANEL
Cholesterol: 135 mg/dL (ref 0–200)
HDL: 57.2 mg/dL (ref >39.00)
LDL Cholesterol: 65 mg/dL (ref 0–99)
NonHDL: 77.75
Total CHOL/HDL Ratio: 2
Triglycerides: 62 mg/dL (ref 0.0–149.0)
VLDL: 12.4 mg/dL (ref 0.0–40.0)

## 2021-05-02 LAB — BASIC METABOLIC PANEL
BUN: 14 mg/dL (ref 6–23)
CO2: 33 mEq/L — ABNORMAL HIGH (ref 19–32)
Calcium: 9.4 mg/dL (ref 8.4–10.5)
Chloride: 94 mEq/L — ABNORMAL LOW (ref 96–112)
Creatinine, Ser: 1 mg/dL (ref 0.40–1.20)
GFR: 60.39 mL/min (ref >60.00)
Glucose, Bld: 375 mg/dL — ABNORMAL HIGH (ref 70–99)
Potassium: 3.7 mEq/L (ref 3.5–5.1)
Sodium: 133 mEq/L — ABNORMAL LOW (ref 135–145)

## 2021-05-02 LAB — MICROALBUMIN / CREATININE URINE RATIO
Creatinine,U: 136.8 mg/dL
Microalb Creat Ratio: 6.6 mg/g (ref 0.0–30.0)
Microalb, Ur: 9 mg/dL — ABNORMAL HIGH (ref 0.0–1.9)

## 2021-05-02 LAB — HEMOGLOBIN A1C: Hgb A1c MFr Bld: 13.5 % — ABNORMAL HIGH (ref 4.6–6.5)

## 2021-05-02 MED ORDER — GLIPIZIDE 5 MG PO TABS: 5.0000 mg | ORAL_TABLET | Freq: Two times a day (BID) | ORAL | 1 refills | Status: DC

## 2021-05-02 MED ORDER — CLONIDINE HCL 0.1 MG PO TABS: 0.1000 mg | ORAL_TABLET | Freq: Two times a day (BID) | ORAL | 3 refills | Status: DC

## 2021-05-02 MED ORDER — METFORMIN HCL 850 MG PO TABS: 850.0000 mg | ORAL_TABLET | Freq: Two times a day (BID) | ORAL | 3 refills | Status: DC

## 2021-05-02 MED ORDER — METOPROLOL SUCCINATE ER 100 MG PO TB24: 100.0000 mg | ORAL_TABLET | Freq: Every day | ORAL | 3 refills | Status: DC

## 2021-05-02 MED ORDER — DULOXETINE HCL 30 MG PO CPEP: 30.0000 mg | ORAL_CAPSULE | Freq: Every day | ORAL | 3 refills | Status: DC

## 2021-05-02 NOTE — Assessment & Plan Note (Signed)
BP at goal with clonidine, lisinopril and metoprolol BP Readings from Last 3 Encounters:  05/02/21 120/84  03/05/21 (!) 134/104  10/14/20 136/83   Maintain med dose Refill sent Repeat BMP

## 2021-05-02 NOTE — Patient Instructions (Addendum)
Go to lab for blood draw  Sign medical release to get records from ophthalmology.  Schedule appt for colon cancer screen

## 2021-05-02 NOTE — Telephone Encounter (Signed)
Duplicate

## 2021-05-02 NOTE — Progress Notes (Signed)
Subjective:  Patient ID: Melissa Bray, female    DOB: [birth year]  Age: 61 y.o. MRN: [id number]  CC: Follow-up (6 month f/u, medication refills needed for Clonidine)  HPI  Essential hypertension BP at goal with clonidine, lisinopril and metoprolol BP Readings from Last 3 Encounters:  05/02/21 120/84  03/05/21 (!) 134/104  10/14/20 136/83   Maintain med dose Refill sent Repeat BMP  Type 2 diabetes mellitus with hyperglycemia, without long-term current use of insulin (HCC) Uncontrolled Current use of metformin and glipizide Fasting glucose at 200 Up to date with DM eye exam, report requested from Dr. Rex Kras Normal DM foot exam today  Repeat CMP, urine microalbumin and hgbA1c: ncontrolled Dm with HgbA1c at 13: maintain glipizide and metformin dose. Add lantus 10units at bedtime. Monitor glucose daily in AM. Send glucose reading via mychart in 2weeks. Maintain 46months f/up appt. Do you want a referral to a nutritionist? Urine microalbumin: positive due to uncontrolled DM which lead to kidney disease. Abnormal BMP due to uncontrolled Dm. Repeat BMP in 43month. Schedule lab appt Normal lipid panel: continue pravastatin. Advised about the importance of low carb/sugar diet and regular exercise F/up in 51months  Depression, recurrent (HCC) Improved with cymbalta Maintain med dose refill sent   Reviewed past Medical, Social and Family history today.  Outpatient Medications Prior to Visit  Medication Sig Dispense Refill   alendronate (FOSAMAX) 70 MG tablet Take 1 tablet (70 mg total) by mouth once a week. Take with a full glass of water on an empty stomach. 12 tablet 3   anastrozole (ARIMIDEX) 1 MG tablet Take 1 mg by mouth daily.     hydrOXYzine (VISTARIL) 50 MG capsule Take 1 capsule (50 mg total) by mouth 3 (three) times daily as needed for itching or anxiety. No additional refills without office visit 60 capsule 0   cloNIDine (CATAPRES) 0.1 MG tablet Take 1 tablet (0.1 mg  total) by mouth 2 (two) times daily. 180 tablet 3   DULoxetine (CYMBALTA) 30 MG capsule Take 1 capsule (30 mg total) by mouth daily. 90 capsule 3   glipiZIDE (GLUCOTROL) 5 MG tablet Take 1 tablet (5 mg total) by mouth 2 (two) times daily before a meal. 180 tablet 1   HYDROcodone-acetaminophen (NORCO/VICODIN) 5-325 MG tablet Take 1 tablet by mouth every 6 (six) hours as needed. 10 tablet 0   lisinopril (ZESTRIL) 40 MG tablet Take 1 tablet (40 mg total) by mouth daily. 90 tablet 1   metFORMIN (GLUCOPHAGE) 850 MG tablet Take 1 tablet (850 mg total) by mouth 2 (two) times daily with a meal. 180 tablet 3   metoprolol succinate (TOPROL-XL) 100 MG 24 hr tablet TAKE 1 TABLET BY MOUTH EVERY DAY 90 tablet 3   pravastatin (PRAVACHOL) 20 MG tablet TAKE 1 TABLET BY MOUTH EVERY DAY 90 tablet 1   amoxicillin-clavulanate (AUGMENTIN) 875-125 MG tablet Take 1 tablet by mouth 2 (two) times daily. One po bid x 7 days (Patient not taking: Reported on 05/02/2021) 14 tablet 0   No facility-administered medications prior to visit.    ROS See HPI  Objective:  BP 120/84 (BP Location: Left Arm, Patient Position: Sitting, Cuff Size: Large)    Pulse (!) 110    Temp (!) 97 F (36.1 C) (Temporal)    Ht 5\' 5"  (1.651 m)    Wt 271 lb (122.9 kg)    SpO2 97%    BMI 45.10 kg/m   Physical Exam Constitutional:      Appearance: She is  obese.  Cardiovascular:     Rate and Rhythm: Normal rate and regular rhythm.     Pulses: Normal pulses.     Heart sounds: Normal heart sounds.  Pulmonary:     Effort: Pulmonary effort is normal.     Breath sounds: Normal breath sounds.  Musculoskeletal:     Cervical back: Normal range of motion and neck supple.     Right lower leg: No edema.     Left lower leg: No edema.  Neurological:     Mental Status: She is alert and oriented to person, place, and time.  Psychiatric:        Mood and Affect: Mood normal.        Behavior: Behavior normal.        Thought Content: Thought content normal.     Assessment & Plan:  This visit occurred during the SARS-CoV-2 public health emergency.  Safety protocols were in place, including screening questions prior to the visit, additional usage of staff PPE, and extensive cleaning of exam room while observing appropriate contact time as indicated for disinfecting solutions.   Melissa Bray was seen today for follow-up.  Diagnoses and all orders for this visit:  Type 2 diabetes mellitus with hyperglycemia, without long-term current use of insulin (HCC) -     Hemoglobin A1c -     Microalbumin / creatinine urine ratio -     Hepatic function panel -     Basic metabolic panel -     Lipid panel -     glipiZIDE (GLUCOTROL) 5 MG tablet; Take 1 tablet (5 mg total) by mouth 2 (two) times daily before a meal. -     metFORMIN (GLUCOPHAGE) 850 MG tablet; Take 1 tablet (850 mg total) by mouth 2 (two) times daily with a meal. -     insulin glargine (LANTUS) 100 UNIT/ML Solostar Pen; Inject 10 Units into the skin at bedtime. -     Insulin Pen Needle (PEN NEEDLES) 31G X 6 MM MISC; 1 application by Does not apply route at bedtime. -     Basic metabolic panel; Future  Essential hypertension -     Basic metabolic panel -     Lipid panel -     cloNIDine (CATAPRES) 0.1 MG tablet; Take 1 tablet (0.1 mg total) by mouth 2 (two) times daily. -     metoprolol succinate (TOPROL-XL) 100 MG 24 hr tablet; Take 1 tablet (100 mg total) by mouth daily. Take with or immediately following a meal. -     Basic metabolic panel; Future -     lisinopril (ZESTRIL) 40 MG tablet; Take 1 tablet (40 mg total) by mouth daily.  Depression, recurrent (HCC) -     DULoxetine (CYMBALTA) 30 MG capsule; Take 1 capsule (30 mg total) by mouth daily.  Encounter for lipid screening for cardiovascular disease -     Lipid panel  Colon cancer screening -     Ambulatory referral to Gastroenterology  Type 2 diabetes mellitus with other specified complication, without long-term current use of insulin  (HCC) -     pravastatin (PRAVACHOL) 20 MG tablet; Take 1 tablet (20 mg total) by mouth daily.    Problem List Items Addressed This Visit       Cardiovascular and Mediastinum   Essential hypertension    BP at goal with clonidine, lisinopril and metoprolol BP Readings from Last 3 Encounters:  05/02/21 120/84  03/05/21 (!) 134/104  10/14/20 136/83   Maintain med dose  Refill sent Repeat BMP      Relevant Medications   cloNIDine (CATAPRES) 0.1 MG tablet   metoprolol succinate (TOPROL-XL) 100 MG 24 hr tablet   lisinopril (ZESTRIL) 40 MG tablet   pravastatin (PRAVACHOL) 20 MG tablet   Other Relevant Orders   Basic metabolic panel (Completed)   Lipid panel (Completed)   Basic metabolic panel     Endocrine   Type 2 diabetes mellitus with hyperglycemia, without long-term current use of insulin (HCC) - Primary    Uncontrolled Current use of metformin and glipizide Fasting glucose at 200 Up to date with DM eye exam, report requested from Dr. Rex Kras Normal DM foot exam today  Repeat CMP, urine microalbumin and hgbA1c: ncontrolled Dm with HgbA1c at 13: maintain glipizide and metformin dose. Add lantus 10units at bedtime. Monitor glucose daily in AM. Send glucose reading via mychart in 2weeks. Maintain 21months f/up appt. Do you want a referral to a nutritionist? Urine microalbumin: positive due to uncontrolled DM which lead to kidney disease. Abnormal BMP due to uncontrolled Dm. Repeat BMP in 68month. Schedule lab appt Normal lipid panel: continue pravastatin. Advised about the importance of low carb/sugar diet and regular exercise F/up in 84months      Relevant Medications   glipiZIDE (GLUCOTROL) 5 MG tablet   metFORMIN (GLUCOPHAGE) 850 MG tablet   insulin glargine (LANTUS) 100 UNIT/ML Solostar Pen   Insulin Pen Needle (PEN NEEDLES) 31G X 6 MM MISC   lisinopril (ZESTRIL) 40 MG tablet   pravastatin (PRAVACHOL) 20 MG tablet   Other Relevant Orders   Hemoglobin A1c (Completed)    Microalbumin / creatinine urine ratio (Completed)   Hepatic function panel (Completed)   Basic metabolic panel (Completed)   Lipid panel (Completed)   Basic metabolic panel     Other   Depression, recurrent (HCC)    Improved with cymbalta Maintain med dose refill sent      Relevant Medications   DULoxetine (CYMBALTA) 30 MG capsule   Other Visit Diagnoses     Encounter for lipid screening for cardiovascular disease       Relevant Orders   Lipid panel (Completed)   Colon cancer screening       Relevant Orders   Ambulatory referral to Gastroenterology   Type 2 diabetes mellitus with other specified complication, without long-term current use of insulin (HCC)       Relevant Medications   glipiZIDE (GLUCOTROL) 5 MG tablet   metFORMIN (GLUCOPHAGE) 850 MG tablet   insulin glargine (LANTUS) 100 UNIT/ML Solostar Pen   lisinopril (ZESTRIL) 40 MG tablet   pravastatin (PRAVACHOL) 20 MG tablet       Follow-up: Return in about 3 months (around 07/30/2021) for DM and HTN, hyperlipidemia (fasting).  Wilfred Lacy, NP

## 2021-05-02 NOTE — Assessment & Plan Note (Addendum)
Uncontrolled Current use of metformin and glipizide Fasting glucose at 200 Up to date with DM eye exam, report requested from Dr. Rex Kras Normal DM foot exam today  Repeat CMP, urine microalbumin and hgbA1c: ncontrolled Dm with HgbA1c at 13: maintain glipizide and metformin dose. Add lantus 10units at bedtime. Monitor glucose daily in AM. Send glucose reading via mychart in 2weeks. Maintain 80months f/up appt. Do you want a referral to a nutritionist? Urine microalbumin: positive due to uncontrolled DM which lead to kidney disease. Abnormal BMP due to uncontrolled Dm. Repeat BMP in 2month. Schedule lab appt Normal lipid panel: continue pravastatin. Advised about the importance of low carb/sugar diet and regular exercise F/up in 3months

## 2021-05-02 NOTE — Assessment & Plan Note (Signed)
Improved with cymbalta Maintain med dose refill sent

## 2021-05-04 MED ORDER — PRAVASTATIN SODIUM 20 MG PO TABS: 20.0000 mg | ORAL_TABLET | Freq: Every day | ORAL | 3 refills | Status: DC

## 2021-05-04 MED ORDER — PEN NEEDLES 31G X 6 MM MISC: 1.0000 "application " | Freq: Every day | 0 refills | Status: DC

## 2021-05-04 MED ORDER — INSULIN GLARGINE 100 UNIT/ML SOLOSTAR PEN: 10.0000 [IU] | PEN_INJECTOR | Freq: Every day | SUBCUTANEOUS | 0 refills | Status: DC

## 2021-05-04 MED ORDER — LISINOPRIL 40 MG PO TABS: 40.0000 mg | ORAL_TABLET | Freq: Every day | ORAL | 3 refills | Status: DC

## 2021-05-07 DIAGNOSIS — C773 Secondary and unspecified malignant neoplasm of axilla and upper limb lymph nodes: Secondary | ICD-10-CM | POA: Diagnosis not present

## 2021-05-07 DIAGNOSIS — C50912 Malignant neoplasm of unspecified site of left female breast: Secondary | ICD-10-CM | POA: Diagnosis not present

## 2021-05-07 DIAGNOSIS — Z79811 Long term (current) use of aromatase inhibitors: Secondary | ICD-10-CM | POA: Diagnosis not present

## 2021-05-07 DIAGNOSIS — C50919 Malignant neoplasm of unspecified site of unspecified female breast: Secondary | ICD-10-CM | POA: Diagnosis not present

## 2021-05-07 DIAGNOSIS — Z9221 Personal history of antineoplastic chemotherapy: Secondary | ICD-10-CM | POA: Diagnosis not present

## 2021-05-07 DIAGNOSIS — K219 Gastro-esophageal reflux disease without esophagitis: Secondary | ICD-10-CM | POA: Diagnosis not present

## 2021-05-07 DIAGNOSIS — R739 Hyperglycemia, unspecified: Secondary | ICD-10-CM | POA: Diagnosis not present

## 2021-05-07 DIAGNOSIS — Z170 Estrogen receptor positive status [ER+]: Secondary | ICD-10-CM | POA: Diagnosis not present

## 2021-05-07 DIAGNOSIS — Z9011 Acquired absence of right breast and nipple: Secondary | ICD-10-CM | POA: Diagnosis not present

## 2021-05-07 DIAGNOSIS — Z923 Personal history of irradiation: Secondary | ICD-10-CM | POA: Diagnosis not present

## 2021-06-14 ENCOUNTER — Other Ambulatory Visit: Payer: Self-pay | Admitting: Nurse Practitioner

## 2021-06-20 ENCOUNTER — Encounter (HOSPITAL_BASED_OUTPATIENT_CLINIC_OR_DEPARTMENT_OTHER): Payer: Self-pay | Admitting: *Deleted

## 2021-06-20 ENCOUNTER — Other Ambulatory Visit: Payer: Self-pay

## 2021-06-20 ENCOUNTER — Emergency Department (HOSPITAL_BASED_OUTPATIENT_CLINIC_OR_DEPARTMENT_OTHER): Payer: BC Managed Care – PPO

## 2021-06-20 ENCOUNTER — Other Ambulatory Visit (HOSPITAL_BASED_OUTPATIENT_CLINIC_OR_DEPARTMENT_OTHER): Payer: Self-pay

## 2021-06-20 ENCOUNTER — Emergency Department (HOSPITAL_BASED_OUTPATIENT_CLINIC_OR_DEPARTMENT_OTHER)
Admission: EM | Admit: 2021-06-20 | Discharge: 2021-06-20 | Disposition: A | Discharge status: Home / Self Care | Payer: BC Managed Care – PPO | Attending: Emergency Medicine | Admitting: Emergency Medicine

## 2021-06-20 DIAGNOSIS — I10 Essential (primary) hypertension: Secondary | ICD-10-CM | POA: Diagnosis not present

## 2021-06-20 DIAGNOSIS — Z79899 Other long term (current) drug therapy: Secondary | ICD-10-CM | POA: Diagnosis not present

## 2021-06-20 DIAGNOSIS — Z7984 Long term (current) use of oral hypoglycemic drugs: Secondary | ICD-10-CM | POA: Diagnosis not present

## 2021-06-20 DIAGNOSIS — Z794 Long term (current) use of insulin: Secondary | ICD-10-CM | POA: Diagnosis not present

## 2021-06-20 DIAGNOSIS — S99921A Unspecified injury of right foot, initial encounter: Secondary | ICD-10-CM | POA: Diagnosis not present

## 2021-06-20 DIAGNOSIS — M7989 Other specified soft tissue disorders: Secondary | ICD-10-CM | POA: Diagnosis not present

## 2021-06-20 DIAGNOSIS — M79671 Pain in right foot: Secondary | ICD-10-CM | POA: Diagnosis not present

## 2021-06-20 DIAGNOSIS — M25571 Pain in right ankle and joints of right foot: Secondary | ICD-10-CM | POA: Diagnosis not present

## 2021-06-20 DIAGNOSIS — X58XXXA Exposure to other specified factors, initial encounter: Secondary | ICD-10-CM | POA: Diagnosis not present

## 2021-06-20 DIAGNOSIS — E119 Type 2 diabetes mellitus without complications: Secondary | ICD-10-CM | POA: Insufficient documentation

## 2021-06-20 MED ORDER — TRAMADOL HCL 50 MG PO TABS
50.0000 mg | ORAL_TABLET | Freq: Four times a day (QID) | ORAL | 0 refills | Status: DC | PRN
  Filled 2021-06-20: qty 20, 5d supply, fill #0

## 2021-06-20 NOTE — ED Provider Notes (Signed)
?[facility] ?Provider Note ? ? ?CSN: [id number] ?Arrival date & time: 06/20/21  1257 ? ?  ? ?History ? ?Chief Complaint  ?Patient presents with  ? Ankle Pain  ? ? ?Melissa Bray is a 61 y.o. female. ? ?Patient was stepping back yesterday and got pain on the lateral aspect of her right foot.  Did not hear a pop or crack.  No history of gout.  It is painful to weight-bear on it today.  No fall.  No other complaints.  Past medical history is just significant for history of concussion about a month ago. ? ?No history of gout. ? ? ?  ? ?Home Medications ?Prior to Admission medications   ?Medication Sig Start Date End Date Taking? Authorizing Provider  ?alendronate (FOSAMAX) 70 MG tablet Take 1 tablet (70 mg total) by mouth once a week. Take with a full glass of water on an empty stomach. 08/14/20   Nche, Charlene Brooke, NP  ?anastrozole (ARIMIDEX) 1 MG tablet Take 1 mg by mouth daily.    [provider]  ?cloNIDine (CATAPRES) 0.1 MG tablet Take 1 tablet (0.1 mg total) by mouth 2 (two) times daily. 05/02/21   Nche, Charlene Brooke, NP  ?DULoxetine (CYMBALTA) 30 MG capsule Take 1 capsule (30 mg total) by mouth daily. 05/02/21   Nche, Charlene Brooke, NP  ?glipiZIDE (GLUCOTROL) 5 MG tablet Take 1 tablet (5 mg total) by mouth 2 (two) times daily before a meal. 05/02/21   Nche, Charlene Brooke, NP  ?hydrOXYzine (VISTARIL) 50 MG capsule Take 1 capsule (50 mg total) by mouth 3 (three) times daily as needed for itching or anxiety. No additional refills without office visit 06/21/20   Nche, Charlene Brooke, NP  ?insulin glargine (LANTUS) 100 UNIT/ML Solostar Pen Inject 10 Units into the skin at bedtime. 05/04/21   Nche, Charlene Brooke, NP  ?Insulin Pen Needle (PEN NEEDLES) 31G X 6 MM MISC 1 application by Does not apply route at bedtime. 05/04/21   Nche, Charlene Brooke, NP  ?lisinopril (ZESTRIL) 40 MG tablet Take 1 tablet (40 mg total) by mouth daily. 05/04/21   Nche, Charlene Brooke, NP  ?metFORMIN (GLUCOPHAGE) 850  MG tablet Take 1 tablet (850 mg total) by mouth 2 (two) times daily with a meal. 05/02/21   Nche, Charlene Brooke, NP  ?metoprolol succinate (TOPROL-XL) 100 MG 24 hr tablet Take 1 tablet (100 mg total) by mouth daily. Take with or immediately following a meal. 05/02/21   Nche, Charlene Brooke, NP  ?pravastatin (PRAVACHOL) 20 MG tablet TAKE 1 TABLET BY MOUTH EVERY DAY 06/14/21   Nche, Charlene Brooke, NP  ?   ? ?Allergies    ?Patient has no known allergies.   ? ?Review of Systems   ?Review of Systems  ?Constitutional:  Negative for chills and fever.  ?HENT:  Negative for ear pain and sore throat.   ?Eyes:  Negative for pain and visual disturbance.  ?Respiratory:  Negative for cough and shortness of breath.   ?Cardiovascular:  Negative for chest pain and palpitations.  ?Gastrointestinal:  Negative for abdominal pain and vomiting.  ?Genitourinary:  Negative for dysuria and hematuria.  ?Musculoskeletal:  Negative for arthralgias, back pain, joint swelling and neck pain.  ?Skin:  Negative for color change and rash.  ?Neurological:  Negative for seizures and syncope.  ?All other systems reviewed and are negative. ? ?Physical Exam ?Updated Vital Signs ?BP (!) 155/97 (BP Location: Right Arm)   Pulse (!) 103   Temp 98 ?  F (36.7 ?C)   Resp 18   Ht 1.651 m ('5\' 5"'$ )   Wt 122.5 kg   SpO2 98%   BMI 44.93 kg/m?  ?Physical Exam ?Vitals and nursing note reviewed.  ?Constitutional:   ?   General: She is not in acute distress. ?   Appearance: Normal appearance. She is well-developed.  ?HENT:  ?   Head: Normocephalic and atraumatic.  ?Eyes:  ?   Extraocular Movements: Extraocular movements intact.  ?   Conjunctiva/sclera: Conjunctivae normal.  ?   Pupils: Pupils are equal, round, and reactive to light.  ?Cardiovascular:  ?   Rate and Rhythm: Normal rate and regular rhythm.  ?   Heart sounds: No murmur heard. ?Pulmonary:  ?   Effort: Pulmonary effort is normal. No respiratory distress.  ?   Breath sounds: Normal breath sounds.  ?Abdominal:  ?    Palpations: Abdomen is soft.  ?   Tenderness: There is no abdominal tenderness.  ?Musculoskeletal:     ?   General: Tenderness present. No swelling.  ?   Cervical back: Normal range of motion and neck supple.  ?   Comments: Tenderness to palpation to the lateral aspect of the right foot about the midfoot area.  No erythema no swelling.  No obvious deformity.  Distally neurovascularly intact.  Dorsalis pedis pulses 1+.  Good cap refill sensation intact foot is warm.  No proximal ankle swelling or tenderness and no proximal leg swelling or tenderness and no tenderness over the proximal fibula.  ?Skin: ?   General: Skin is warm and dry.  ?   Capillary Refill: Capillary refill takes less than 2 seconds.  ?Neurological:  ?   General: No focal deficit present.  ?   Mental Status: She is alert and oriented to person, place, and time.  ?   Cranial Nerves: No cranial nerve deficit.  ?   Sensory: No sensory deficit.  ?Psychiatric:     ?   Mood and Affect: Mood normal.  ? ? ?ED Results / Procedures / Treatments   ?Labs ?(all labs ordered are listed, but only abnormal results are displayed) ?Labs Reviewed - No data to display ? ?EKG ?None ? ?Radiology ?No results found. ? ?Procedures ?Procedures  ? ? ?Medications Ordered in ED ?Medications - No data to display ? ?ED Course/ Medical Decision Making/ A&P ?  ?                        ?Medical Decision Making ?Amount and/or Complexity of Data Reviewed ?Radiology: ordered. ? ?Risk ?Prescription drug management. ? ?X-ray will be significant to rule out a stress fracture.  Patient probably be treated with a postop shoe.  Follow-up with sports medicine upstairs.  Patient will probably need tramadol for pain. ? ? ?Unfortunately patient had x-ray of the right ankle.  Which really does not visualize the area where her pain is.  So she is going to need a right foot series. ? ?X-ray of the foot showed a questionable fracture older new at the base of the little toe.  The patient has no  tenderness or swelling in that area.  So I do not think that is part of her acute injury.  Her discomfort is further back on the forefoot more on the proximal one third of the fifth metatarsal. ? ?Will treat with tramadol walking boot and have her follow-up with sports medicine.  Work note provided. ? ? ?Final Clinical Impression(s) /  ED Diagnoses ?Final diagnoses:  ?Foot pain, right  ? ? ?Rx / DC Orders ?ED Discharge Orders   ? ? None  ? ?  ? ? ?  ?Fredia Sorrow, MD ?06/20/21 1453 ? ?

## 2021-06-20 NOTE — Discharge Instructions (Signed)
Use the cam walker to walk.  Work note provided to be out of work.  Make an appointment to follow-up with sports medicine.  Take the tramadol as needed.  Return for any new or worse symptoms.  X-ray showed may be a fracture at the base of the little toe.  But that is not where you are tender.  So this is probably just a foot sprain. ?

## 2021-06-20 NOTE — ED Triage Notes (Signed)
Presents with rt ankle pain, denies any falls or injury, stated she step back and began having rt ankle pain. No visible swelling noted at this time. Pt applied ace wrap herself.  ?

## 2021-06-29 ENCOUNTER — Telehealth: Payer: Self-pay | Admitting: Nurse Practitioner

## 2021-06-29 NOTE — Telephone Encounter (Signed)
Pt needs her Anastrozole refilled. She uses [facility]  on [address] ? ?

## 2021-07-17 NOTE — Telephone Encounter (Signed)
LVM informing patient to contact oncology for medication refill and call our office with any further questions.  ?

## 2021-07-31 ENCOUNTER — Ambulatory Visit: Payer: BC Managed Care – PPO | Admitting: Nurse Practitioner

## 2021-08-14 ENCOUNTER — Encounter: Payer: Self-pay | Admitting: Nurse Practitioner

## 2021-08-14 ENCOUNTER — Ambulatory Visit: Payer: BC Managed Care – PPO | Admitting: Nurse Practitioner

## 2021-08-14 VITALS — BP 144/92 | HR 75 | Temp 97.0°F | Wt 280.0 lb

## 2021-08-14 DIAGNOSIS — Z79811 Long term (current) use of aromatase inhibitors: Secondary | ICD-10-CM

## 2021-08-14 DIAGNOSIS — F411 Generalized anxiety disorder: Secondary | ICD-10-CM | POA: Diagnosis not present

## 2021-08-14 DIAGNOSIS — Z90710 Acquired absence of both cervix and uterus: Secondary | ICD-10-CM

## 2021-08-14 DIAGNOSIS — E1165 Type 2 diabetes mellitus with hyperglycemia: Secondary | ICD-10-CM

## 2021-08-14 DIAGNOSIS — Z1211 Encounter for screening for malignant neoplasm of colon: Secondary | ICD-10-CM

## 2021-08-14 DIAGNOSIS — M8580 Other specified disorders of bone density and structure, unspecified site: Secondary | ICD-10-CM | POA: Insufficient documentation

## 2021-08-14 DIAGNOSIS — I10 Essential (primary) hypertension: Secondary | ICD-10-CM

## 2021-08-14 DIAGNOSIS — M810 Age-related osteoporosis without current pathological fracture: Secondary | ICD-10-CM

## 2021-08-14 DIAGNOSIS — C50919 Malignant neoplasm of unspecified site of unspecified female breast: Secondary | ICD-10-CM

## 2021-08-14 LAB — POCT GLYCOSYLATED HEMOGLOBIN (HGB A1C): Hemoglobin A1C: 10.8 % — AB (ref 4.0–5.6)

## 2021-08-14 MED ORDER — ALENDRONATE SODIUM 70 MG PO TABS: 70.0000 mg | ORAL_TABLET | ORAL | 3 refills | Status: DC

## 2021-08-14 MED ORDER — INSULIN GLARGINE 100 UNIT/ML SOLOSTAR PEN: 15.0000 [IU] | PEN_INJECTOR | Freq: Every day | SUBCUTANEOUS | 2 refills | Status: DC

## 2021-08-14 MED ORDER — HYDROXYZINE PAMOATE 25 MG PO CAPS: 50.0000 mg | ORAL_CAPSULE | Freq: Three times a day (TID) | ORAL | 1 refills | Status: DC | PRN

## 2021-08-14 MED ORDER — RYBELSUS 3 MG PO TABS: 3.0000 mg | ORAL_TABLET | Freq: Every day | ORAL | 5 refills | Status: DC

## 2021-08-14 MED ORDER — AMLODIPINE BESYLATE 5 MG PO TABS: 5.0000 mg | ORAL_TABLET | Freq: Every day | ORAL | 3 refills | Status: DC

## 2021-08-14 NOTE — Assessment & Plan Note (Addendum)
dexa scan 2022: osteopenia ?Long term use of arimidex ? ?Maintain fosamax weekly, calcium '600mg'$  and vit d 1000IU daily ?Refill sent ?

## 2021-08-14 NOTE — Assessment & Plan Note (Addendum)
Up to date with mammogram 2022, next one 10/2021 ?Daily use of arimidex ?Annual appt with oncology. ?

## 2021-08-14 NOTE — Assessment & Plan Note (Signed)
hysterectomy at age 61 due to menorrhagia., no hx of abnormal PAP, no FHx of cervical and uterine cancer. ?

## 2021-08-14 NOTE — Assessment & Plan Note (Addendum)
Uncontrolled  ?Repeat hgbA1c: 10.8% today ?Fasting glucose 98-200 ? ?Maintain metformin dose ?Add rybelsus '3mg'$  ?Increase lantus to 15units at hs ?D/c glipizide ?F/up in 21month?

## 2021-08-14 NOTE — Assessment & Plan Note (Signed)
BP not at goal with clonidine, lisinopril and metoprolol. ?BP Readings from Last 3 Encounters:  ?08/14/21 (!) 144/92  ?06/20/21 (!) 148/85  ?05/02/21 120/84  ? ?Add amlodipine '5mg'$  at hs ?Maintain other med doses ?Advised about the importance of DASH diet ?F/up in 84month?

## 2021-08-14 NOTE — Progress Notes (Signed)
? ?             Established Patient Visit ? ?Patient: Melissa Bray   DOB: January 07, 1961   61 y.o. Female  MRN: [id number] ?Visit Date: 08/14/2021 ? ?Subjective:  ?  ?Chief Complaint  ?Patient presents with  ? Follow-up  ?  3 month follow up for DM, HTN, Hyperlipidemia. Patient is fasting. Patient would like to discuss starting Ozempic as well. Refill on lisinopril. Blood sugar readings 98-200. Patient will update shingles vaccine through mychart. She is aware that she is due for colonoscopy, but hasn't scheduled.   ? ?HPI ?S/P hysterectomy ?hysterectomy at age 32 due to menorrhagia., no hx of abnormal PAP, no FHx of cervical and uterine cancer. ? ?Type 2 diabetes mellitus with hyperglycemia, without long-term current use of insulin ([place]) ?Uncontrolled  ?Repeat hgbA1c: 10.8% today ?Fasting glucose 98-200 ? ?Maintain metformin dose ?Add rybelsus '3mg'$  ?Increase lantus to 15units at hs ?D/c glipizide ?F/up in 58month? ?Essential hypertension ?BP not at goal with clonidine, lisinopril and metoprolol. ?BP Readings from Last 3 Encounters:  ?08/14/21 (!) 144/92  ?06/20/21 (!) 148/85  ?05/02/21 120/84  ? ?Add amlodipine '5mg'$  at hs ?Maintain other med doses ?Advised about the importance of DASH diet ?F/up in 173month ?Age-related osteoporosis without current pathological fracture ?dexa scan 2022: osteopenia ?Long term use of arimidex ? ?Maintain fosamax weekly, calcium '600mg'$  and vit d 1000IU daily ?Refill sent ? ?Breast cancer, stage 3 (HCC) ?Up to date with mammogram 2022, next one 10/2021 ?Daily use of arimidex ?Annual appt with oncology. ? ? ?Reviewed medical, surgical, and social history today ? ?Medications: ?Outpatient Medications Prior to Visit  ?Medication Sig  ? anastrozole (ARIMIDEX) 1 MG tablet Take 1 mg by mouth daily.  ? cloNIDine (CATAPRES) 0.1 MG tablet Take 1 tablet (0.1 mg total) by mouth 2 (two) times daily.  ? Insulin Pen Needle (PEN NEEDLES) 31G X 6 MM MISC 1 application by Does not apply route at bedtime.  ?  lisinopril (ZESTRIL) 40 MG tablet Take 1 tablet (40 mg total) by mouth daily.  ? metFORMIN (GLUCOPHAGE) 850 MG tablet Take 1 tablet (850 mg total) by mouth 2 (two) times daily with a meal.  ? metoprolol succinate (TOPROL-XL) 100 MG 24 hr tablet Take 1 tablet (100 mg total) by mouth daily. Take with or immediately following a meal.  ? pravastatin (PRAVACHOL) 20 MG tablet TAKE 1 TABLET BY MOUTH EVERY DAY  ? traMADol (ULTRAM) 50 MG tablet Take 1 tablet (50 mg total) by mouth every 6 (six) hours as needed.  ? [DISCONTINUED] alendronate (FOSAMAX) 70 MG tablet Take 1 tablet (70 mg total) by mouth once a week. Take with a full glass of water on an empty stomach.  ? [DISCONTINUED] DULoxetine (CYMBALTA) 30 MG capsule Take 1 capsule (30 mg total) by mouth daily.  ? [DISCONTINUED] glipiZIDE (GLUCOTROL) 5 MG tablet Take 1 tablet (5 mg total) by mouth 2 (two) times daily before a meal.  ? [DISCONTINUED] hydrOXYzine (VISTARIL) 50 MG capsule Take 1 capsule (50 mg total) by mouth 3 (three) times daily as needed for itching or anxiety. No additional refills without office visit  ? [DISCONTINUED] insulin glargine (LANTUS) 100 UNIT/ML Solostar Pen Inject 10 Units into the skin at bedtime.  ? ?No facility-administered medications prior to visit.  ? ?Reviewed past medical and social history.  ? ?ROS per HPI above ? ? ?   ?Objective:  ?BP (!) 144/92 (BP Location: Right Arm, Patient Position: Sitting, Cuff Size:  Normal)   Pulse 75   Temp (!) 97 ?F (36.1 ?C) (Temporal)   Wt 280 lb (127 kg)   SpO2 96%   BMI 46.59 kg/m?  ? ?  ? ?Physical Exam ?Cardiovascular:  ?   Rate and Rhythm: Normal rate and regular rhythm.  ?   Pulses: Normal pulses.  ?   Heart sounds: Normal heart sounds.  ?Pulmonary:  ?   Effort: Pulmonary effort is normal.  ?   Breath sounds: Normal breath sounds.  ?Musculoskeletal:  ?   Left lower leg: No edema.  ?Neurological:  ?   Mental Status: She is alert and oriented to person, place, and time.  ?  ?Results for orders  placed or performed in visit on 08/14/21  ?POCT glycosylated hemoglobin (Hb A1C)  ?Result Value Ref Range  ? Hemoglobin A1C 10.8 (A) 4.0 - 5.6 %  ? HbA1c POC (<> result, manual entry)    ? HbA1c, POC (prediabetic range)    ? HbA1c, POC (controlled diabetic range)    ? ?   ?Assessment & Plan:  ?  ?Problem List Items Addressed This Visit   ? ?  ? Cardiovascular and Mediastinum  ? Essential hypertension - Primary  ?  BP not at goal with clonidine, lisinopril and metoprolol. ?BP Readings from Last 3 Encounters:  ?08/14/21 (!) 144/92  ?06/20/21 (!) 148/85  ?05/02/21 120/84  ? ?Add amlodipine '5mg'$  at hs ?Maintain other med doses ?Advised about the importance of DASH diet ?F/up in 56month?  ?  ? Relevant Medications  ? amLODipine (NORVASC) 5 MG tablet  ?  ? Endocrine  ? Type 2 diabetes mellitus with hyperglycemia, without long-term [place] use of insulin [place]  ?  Uncontrolled  ?Repeat hgbA1c: 10.8% today ?Fasting glucose 98-200 ? ?Maintain metformin dose ?Add rybelsus '3mg'$  ?Increase lantus to 15units at hs ?D/c glipizide ?F/up in 147month ?  ?  ? Relevant Medications  ? insulin glargine (LANTUS) 100 UNIT/ML Solostar Pen  ? Semaglutide (RYBELSUS) 3 MG TABS  ? Other Relevant Orders  ? POCT glycosylated hemoglobin (Hb A1C) (Completed)  ?  ? Musculoskeletal and Integument  ? Age-related osteoporosis without current pathological fracture  ?  dexa scan 2022: osteopenia ?Long term use of arimidex ? ?Maintain fosamax weekly, calcium '600mg'$  and vit d 1000IU daily ?Refill sent ? ?  ?  ? Relevant Medications  ? alendronate (FOSAMAX) 70 MG tablet  ?  ? Other  ? Breast cancer, stage 3 (HCC)  ?  Up to date with mammogram 2022, next one 10/2021 ?Daily use of arimidex ?Annual appt with oncology. ? ?  ?  ? Long term (current) use of aromatase inhibitors  ? Relevant Medications  ? alendronate (FOSAMAX) 70 MG tablet  ? S/P hysterectomy  ?  hysterectomy at age [age]ue to menorrhagia., no hx of abnormal PAP, no FHx of cervical and uterine  cancer. ? ?  ?  ? ?Other Visit Diagnoses   ? ? GAD (generalized anxiety disorder)      ? Relevant Medications  ? hydrOXYzine (VISTARIL) 25 MG capsule  ? Colon cancer screening      ? Relevant Orders  ? Ambulatory referral to Gastroenterology  ? ?  ? ?Return in about 4 weeks (around 09/11/2021) for HTN. ? ?  ? ?ChWilfred LacyNP ? ? ?

## 2021-08-14 NOTE — Patient Instructions (Addendum)
Start amlodipine '5mg'$  at bedtime ?Continue clonidine, lisinopril and metoprolol as prescribed. ?Maintain low sodium diet. ?Monitor BP in AM, bring BP reading to next appt. ? ?hgbA1c at 10.8%: uncontrolled ?Hold on ozempic rx at this time ?Maintain metformin dose ?Start rybelsus '3mg'$  daily ?Increase lantus dose to 15units at bedtime ?Bring glucose readings to next appt. ? ?DASH Eating Plan ?DASH stands for Dietary Approaches to Stop Hypertension. The DASH eating plan is a healthy eating plan that has been shown to: ?Reduce high blood pressure (hypertension). ?Reduce your risk for type 2 diabetes, heart disease, and stroke. ?Help with weight loss. ?What are tips for following this plan? ?Reading food labels ?Check food labels for the amount of salt (sodium) per serving. Choose foods with less than 5 percent of the Daily Value of sodium. Generally, foods with less than 300 milligrams (mg) of sodium per serving fit into this eating plan. ?To find whole grains, look for the word "whole" as the first word in the ingredient list. ?Shopping ?Buy products labeled as "low-sodium" or "no salt added." ?Buy fresh foods. Avoid canned foods and pre-made or frozen meals. ?Cooking ?Avoid adding salt when cooking. Use salt-free seasonings or herbs instead of table salt or sea salt. Check with your health care provider or pharmacist before using salt substitutes. ?Do not fry foods. Cook foods using healthy methods such as baking, boiling, grilling, roasting, and broiling instead. ?Cook with heart-healthy oils, such as olive, canola, avocado, soybean, or sunflower oil. ?Meal planning ? ?Eat a balanced diet that includes: ?4 or more servings of fruits and 4 or more servings of vegetables each day. Try to fill one-half of your plate with fruits and vegetables. ?6-8 servings of whole grains each day. ?Less than 6 oz (170 g) of lean meat, poultry, or fish each day. A 3-oz (85-g) serving of meat is about the same size as a deck of cards. One  egg equals 1 oz (28 g). ?2-3 servings of low-fat dairy each day. One serving is 1 cup (237 mL). ?1 serving of nuts, seeds, or beans 5 times each week. ?2-3 servings of heart-healthy fats. Healthy fats called omega-3 fatty acids are found in foods such as walnuts, flaxseeds, fortified milks, and eggs. These fats are also found in cold-water fish, such as sardines, salmon, and mackerel. ?Limit how much you eat of: ?Canned or prepackaged foods. ?Food that is high in trans fat, such as some fried foods. ?Food that is high in saturated fat, such as fatty meat. ?Desserts and other sweets, sugary drinks, and other foods with added sugar. ?Full-fat dairy products. ?Do not salt foods before eating. ?Do not eat more than 4 egg yolks a week. ?Try to eat at least 2 vegetarian meals a week. ?Eat more home-cooked food and less restaurant, buffet, and fast food. ?Lifestyle ?When eating at a restaurant, ask that your food be prepared with less salt or no salt, if possible. ?If you drink alcohol: ?Limit how much you use to: ?0-1 drink a day for women who are not pregnant. ?0-2 drinks a day for men. ?Be aware of how much alcohol is in your drink. In the [place] one drink equals one 12 oz bottle of beer (355 mL), one 5 oz glass of wine (148 mL), or one 1? oz glass of hard liquor (44 mL). ?General information ?Avoid eating more than 2,300 mg of salt a day. If you have hypertension, you may need to reduce your sodium intake to 1,500 mg a day. ?Work with  your health care provider to maintain a healthy body weight or to lose weight. Ask what an ideal weight is for you. ?Get at least 30 minutes of exercise that causes your heart to beat faster (aerobic exercise) most days of the week. Activities may include walking, swimming, or biking. ?Work with your health care provider or dietitian to adjust your eating plan to your individual calorie needs. ?What foods should I eat? ?Fruits ?All fresh, dried, or frozen fruit. Canned fruit in natural  juice (without added sugar). ?Vegetables ?Fresh or frozen vegetables (raw, steamed, roasted, or grilled). Low-sodium or reduced-sodium tomato and vegetable juice. Low-sodium or reduced-sodium tomato sauce and tomato paste. Low-sodium or reduced-sodium canned vegetables. ?Grains ?Whole-grain or whole-wheat bread. Whole-grain or whole-wheat pasta. Brown rice. [place]. Bulgur. Whole-grain and low-sodium cereals. Pita bread. Low-fat, low-sodium crackers. Whole-wheat flour tortillas. ?Meats and other proteins ?Skinless chicken or [place]. Ground chicken or [place]. Pork with fat trimmed off. Fish and seafood. Egg whites. Dried beans, peas, or lentils. Unsalted nuts, nut butters, and seeds. Unsalted canned beans. Lean cuts of beef with fat trimmed off. Low-sodium, lean precooked or cured meat, such as sausages or meat loaves. ?Dairy ?Low-fat (1%) or fat-free (skim) milk. Reduced-fat, low-fat, or fat-free cheeses. Nonfat, low-sodium ricotta or cottage cheese. Low-fat or nonfat yogurt. Low-fat, low-sodium cheese. ?Fats and oils ?Soft margarine without trans fats. Vegetable oil. Reduced-fat, low-fat, or light mayonnaise and salad dressings (reduced-sodium). Canola, safflower, olive, avocado, soybean, and sunflower oils. Avocado. ?Seasonings and condiments ?Herbs. Spices. Seasoning mixes without salt. ?Other foods ?Unsalted popcorn and pretzels. Fat-free sweets. ?The items listed above may not be a complete list of foods and beverages you can eat. Contact a dietitian for more information. ?What foods should I avoid? ?Fruits ?Canned fruit in a light or heavy syrup. Fried fruit. Fruit in cream or butter sauce. ?Vegetables ?Creamed or fried vegetables. Vegetables in a cheese sauce. Regular canned vegetables (not low-sodium or reduced-sodium). Regular canned tomato sauce and paste (not low-sodium or reduced-sodium). Regular tomato and vegetable juice (not low-sodium or reduced-sodium). Angie Fava. Olives. ?Grains ?Baked goods  made with fat, such as croissants, muffins, or some breads. Dry pasta or rice meal packs. ?Meats and other proteins ?Fatty cuts of meat. Ribs. Fried meat. Berniece Salines. Bologna, salami, and other precooked or cured meats, such as sausages or meat loaves. Fat from the back of a pig (fatback). Bratwurst. Salted nuts and seeds. Canned beans with added salt. Canned or smoked fish. Whole eggs or egg yolks. Chicken or [place] with skin. ?Dairy ?Whole or 2% milk, cream, and half-and-half. Whole or full-fat cream cheese. Whole-fat or sweetened yogurt. Full-fat cheese. Nondairy creamers. Whipped toppings. Processed cheese and cheese spreads. ?Fats and oils ?Butter. Stick margarine. Lard. Shortening. Ghee. Bacon fat. Tropical oils, such as coconut, palm kernel, or palm oil. ?Seasonings and condiments ?Onion salt, garlic salt, seasoned salt, table salt, and sea salt. Worcestershire sauce. Tartar sauce. Barbecue sauce. Teriyaki sauce. Soy sauce, including reduced-sodium. Steak sauce. Canned and packaged gravies. Fish sauce. Oyster sauce. Cocktail sauce. Store-bought horseradish. Ketchup. Mustard. Meat flavorings and tenderizers. Bouillon cubes. Hot sauces. Pre-made or packaged marinades. Pre-made or packaged taco seasonings. Relishes. Regular salad dressings. ?Other foods ?Salted popcorn and pretzels. ?The items listed above may not be a complete list of foods and beverages you should avoid. Contact a dietitian for more information. ?Where to find more information ?National Heart, Lung, and Blood Institute: https://wilson-eaton.com/ ?American Heart Association: www.heart.org ?Academy of Nutrition and Dietetics: www.eatright.org ?[place]: www.kidney.org ?Summary ?  The DASH eating plan is a healthy eating plan that has been shown to reduce high blood pressure (hypertension). It may also reduce your risk for type 2 diabetes, heart disease, and stroke. ?When on the DASH eating plan, aim to eat more fresh fruits and vegetables,  whole grains, lean proteins, low-fat dairy, and heart-healthy fats. ?With the DASH eating plan, you should limit salt (sodium) intake to 2,300 mg a day. If you have hypertension, you may need to reduce you

## 2021-09-11 ENCOUNTER — Ambulatory Visit: Payer: BC Managed Care – PPO | Admitting: Nurse Practitioner

## 2021-09-16 ENCOUNTER — Other Ambulatory Visit: Payer: Self-pay

## 2021-09-16 ENCOUNTER — Emergency Department (HOSPITAL_BASED_OUTPATIENT_CLINIC_OR_DEPARTMENT_OTHER)
Admission: EM | Admit: 2021-09-16 | Discharge: 2021-09-16 | Disposition: A | Discharge status: Home / Self Care | Payer: BC Managed Care – PPO | Attending: Emergency Medicine | Admitting: Emergency Medicine

## 2021-09-16 ENCOUNTER — Encounter (HOSPITAL_BASED_OUTPATIENT_CLINIC_OR_DEPARTMENT_OTHER): Payer: Self-pay | Admitting: Emergency Medicine

## 2021-09-16 DIAGNOSIS — R739 Hyperglycemia, unspecified: Secondary | ICD-10-CM

## 2021-09-16 DIAGNOSIS — E1165 Type 2 diabetes mellitus with hyperglycemia: Secondary | ICD-10-CM | POA: Diagnosis not present

## 2021-09-16 DIAGNOSIS — Z7984 Long term (current) use of oral hypoglycemic drugs: Secondary | ICD-10-CM | POA: Diagnosis not present

## 2021-09-16 DIAGNOSIS — Z79899 Other long term (current) drug therapy: Secondary | ICD-10-CM | POA: Diagnosis not present

## 2021-09-16 DIAGNOSIS — R5383 Other fatigue: Secondary | ICD-10-CM | POA: Diagnosis not present

## 2021-09-16 DIAGNOSIS — Z794 Long term (current) use of insulin: Secondary | ICD-10-CM | POA: Insufficient documentation

## 2021-09-16 LAB — I-STAT VENOUS BLOOD GAS, ED
Acid-Base Excess: 2 mmol/L (ref 0.0–2.0)
Bicarbonate: 28.3 mmol/L — ABNORMAL HIGH (ref 20.0–28.0)
Calcium, Ion: 1.27 mmol/L (ref 1.15–1.40)
HCT: 43 % (ref 36.0–46.0)
Hemoglobin: 14.6 g/dL (ref 12.0–15.0)
O2 Saturation: 52 %
Potassium: 3.8 mmol/L (ref 3.5–5.1)
Sodium: 137 mmol/L (ref 135–145)
TCO2: 30 mmol/L (ref 22–32)
pCO2, Ven: 48.9 mmHg (ref 44–60)
pH, Ven: 7.371 (ref 7.25–7.43)
pO2, Ven: 29 mmHg — CL (ref 32–45)

## 2021-09-16 LAB — CBC WITH DIFFERENTIAL/PLATELET
Abs Immature Granulocytes: 0.02 10*3/uL (ref 0.00–0.07)
Basophils Absolute: 0.1 10*3/uL (ref 0.0–0.1)
Basophils Relative: 1 %
Eosinophils Absolute: 0.2 10*3/uL (ref 0.0–0.5)
Eosinophils Relative: 4 %
HCT: 41.4 % (ref 36.0–46.0)
Hemoglobin: 13.7 g/dL (ref 12.0–15.0)
Immature Granulocytes: 0 %
Lymphocytes Relative: 31 %
Lymphs Abs: 2 10*3/uL (ref 0.7–4.0)
MCH: 28.1 pg (ref 26.0–34.0)
MCHC: 33.1 g/dL (ref 30.0–36.0)
MCV: 85 fL (ref 80.0–100.0)
Monocytes Absolute: 0.6 10*3/uL (ref 0.1–1.0)
Monocytes Relative: 10 %
Neutro Abs: 3.5 10*3/uL (ref 1.7–7.7)
Neutrophils Relative %: 54 %
Platelets: 173 10*3/uL (ref 150–400)
RBC: 4.87 MIL/uL (ref 3.87–5.11)
RDW: 13.6 % (ref 11.5–15.5)
WBC: 6.4 10*3/uL (ref 4.0–10.5)
nRBC: 0 % (ref 0.0–0.2)

## 2021-09-16 LAB — COMPREHENSIVE METABOLIC PANEL
ALT: 35 U/L (ref 0–44)
AST: 33 U/L (ref 15–41)
Albumin: 3.9 g/dL (ref 3.5–5.0)
Alkaline Phosphatase: 86 U/L (ref 38–126)
Anion gap: 6 (ref 5–15)
BUN: 15 mg/dL (ref 8–23)
CO2: 27 mmol/L (ref 22–32)
Calcium: 9.7 mg/dL (ref 8.9–10.3)
Chloride: 101 mmol/L (ref 98–111)
Creatinine, Ser: 0.81 mg/dL (ref 0.44–1.00)
GFR, Estimated: >60 mL/min (ref >60)
Glucose, Bld: 356 mg/dL — ABNORMAL HIGH (ref 70–99)
Potassium: 3.7 mmol/L (ref 3.5–5.1)
Sodium: 134 mmol/L — ABNORMAL LOW (ref 135–145)
Total Bilirubin: 0.3 mg/dL (ref 0.3–1.2)
Total Protein: 7.6 g/dL (ref 6.5–8.1)

## 2021-09-16 LAB — CBG MONITORING, ED
Glucose-Capillary: 346 mg/dL — ABNORMAL HIGH (ref 70–99)
Glucose-Capillary: 296 mg/dL — ABNORMAL HIGH (ref 70–99)

## 2021-09-16 LAB — LIPASE, BLOOD: Lipase: 34 U/L (ref 11–51)

## 2021-09-16 MED ORDER — SODIUM CHLORIDE 0.9 % IV BOLUS
2000.0000 mL | Freq: Once | INTRAVENOUS | Status: AC
  Administered 2021-09-16: 2000 mL via INTRAVENOUS

## 2021-09-16 MED ORDER — INSULIN ASPART 100 UNIT/ML IJ SOLN
5.0000 [IU] | Freq: Once | INTRAMUSCULAR | Status: AC
  Administered 2021-09-16: 5 [IU] via SUBCUTANEOUS

## 2021-09-16 MED ORDER — SODIUM CHLORIDE 0.9 % IV BOLUS: 1000.0000 mL | Freq: Once | INTRAVENOUS | Status: DC

## 2021-09-16 NOTE — ED Triage Notes (Signed)
Pt arrives pov, steady gait, endorses fatigue and hyperglycemia. Endorses taking metformin x 40 mins pta.

## 2021-09-16 NOTE — ED Provider Notes (Signed)
[facility] EMERGENCY DEPARTMENT Provider Note   CSN: [id number] Arrival date & time: 09/16/21  1545     History  Chief Complaint  Patient presents with   Hyperglycemia    Melissa Bray is a 61 y.o. female.  Patient history of diabetes on metformin and long-acting once daily insulin presents with high blood sugar.  Blood sugar was in the 400s yesterday.  She took some metformin prior to arrival today.  She has had some generalized fatigue but denies any chest pain, abdominal pain, nausea, vomiting, other recent illness.  Nothing has made it worse or better.  Denies any pain with urination.  Denies any fevers or chills.  No weakness or numbness.  Blood sugar has been high the last 2 days.  The history is provided by the patient.       Home Medications Prior to Admission medications   Medication Sig Start Date End Date Taking? Authorizing Provider  alendronate (FOSAMAX) 70 MG tablet Take 1 tablet (70 mg total) by mouth once a week. Take with a full glass of water on an empty stomach. 08/14/21   Nche, Charlene Brooke, NP  amLODipine (NORVASC) 5 MG tablet Take 1 tablet (5 mg total) by mouth at bedtime. 08/14/21   Nche, Charlene Brooke, NP  anastrozole (ARIMIDEX) 1 MG tablet Take 1 mg by mouth daily.    [provider]  cloNIDine (CATAPRES) 0.1 MG tablet Take 1 tablet (0.1 mg total) by mouth 2 (two) times daily. 05/02/21   Nche, Charlene Brooke, NP  hydrOXYzine (VISTARIL) 25 MG capsule Take 2 capsules (50 mg total) by mouth 3 (three) times daily as needed for anxiety. No additional refills without office visit 08/14/21   Nche, Charlene Brooke, NP  insulin glargine (LANTUS) 100 UNIT/ML Solostar Pen Inject 15 Units into the skin at bedtime. 08/14/21   Nche, Charlene Brooke, NP  Insulin Pen Needle (PEN NEEDLES) 31G X 6 MM MISC 1 application by Does not apply route at bedtime. 05/04/21   Nche, Charlene Brooke, NP  lisinopril (ZESTRIL) 40 MG tablet Take 1 tablet (40 mg total) by mouth daily.  05/04/21   Nche, Charlene Brooke, NP  metFORMIN (GLUCOPHAGE) 850 MG tablet Take 1 tablet (850 mg total) by mouth 2 (two) times daily with a meal. 05/02/21   Nche, Charlene Brooke, NP  metoprolol succinate (TOPROL-XL) 100 MG 24 hr tablet Take 1 tablet (100 mg total) by mouth daily. Take with or immediately following a meal. 05/02/21   Nche, Charlene Brooke, NP  pravastatin (PRAVACHOL) 20 MG tablet TAKE 1 TABLET BY MOUTH EVERY DAY 06/14/21   Nche, Charlene Brooke, NP  Semaglutide (RYBELSUS) 3 MG TABS Take 3 mg by mouth daily. 08/14/21   Nche, Charlene Brooke, NP  traMADol (ULTRAM) 50 MG tablet Take 1 tablet (50 mg total) by mouth every 6 (six) hours as needed. 06/20/21   Fredia Sorrow, MD      Allergies    Patient has no known allergies.    Review of Systems   Review of Systems  Physical Exam Updated Vital Signs BP (!) 150/79   Pulse 82   Temp 98.6 F (37 C) (Oral)   Resp 17   Ht '5\' 5"'$  (1.651 m)   Wt 122.5 kg   SpO2 99%   BMI 44.93 kg/m  Physical Exam Vitals and nursing note reviewed.  Constitutional:      General: She is not in acute distress.    Appearance: She is well-developed. She is not ill-appearing.  HENT:     Head: Normocephalic and atraumatic.  Eyes:     Extraocular Movements: Extraocular movements intact.     Conjunctiva/sclera: Conjunctivae normal.     Pupils: Pupils are equal, round, and reactive to light.  Cardiovascular:     Rate and Rhythm: Normal rate and regular rhythm.     Pulses: Normal pulses.     Heart sounds: Normal heart sounds. No murmur heard. Pulmonary:     Effort: Pulmonary effort is normal. No respiratory distress.     Breath sounds: Normal breath sounds.  Abdominal:     Palpations: Abdomen is soft.     Tenderness: There is no abdominal tenderness.  Musculoskeletal:        General: No swelling.     Cervical back: Neck supple.  Skin:    General: Skin is warm and dry.     Capillary Refill: Capillary refill takes less than 2 seconds.  Neurological:      General: No focal deficit present.     Mental Status: She is alert and oriented to person, place, and time.     Cranial Nerves: No cranial nerve deficit.     Sensory: No sensory deficit.     Coordination: Coordination normal.  Psychiatric:        Mood and Affect: Mood normal.     ED Results / Procedures / Treatments   Labs (all labs ordered are listed, but only abnormal results are displayed) Labs Reviewed  COMPREHENSIVE METABOLIC PANEL - Abnormal; Notable for the following components:      Result Value   Sodium 134 (*)    Glucose, Bld 356 (*)    All other components within normal limits  CBG MONITORING, ED - Abnormal; Notable for the following components:   Glucose-Capillary 346 (*)    All other components within normal limits  I-STAT VENOUS BLOOD GAS, ED - Abnormal; Notable for the following components:   pO2, Ven 29 (*)    Bicarbonate 28.3 (*)    All other components within normal limits  CBG MONITORING, ED - Abnormal; Notable for the following components:   Glucose-Capillary 296 (*)    All other components within normal limits  CBC WITH DIFFERENTIAL/PLATELET  LIPASE, BLOOD    EKG None  Radiology No results found.  Procedures Procedures    Medications Ordered in ED Medications  sodium chloride 0.9 % bolus 2,000 mL (2,000 mLs Intravenous New Bag/Given 09/16/21 1625)  insulin aspart (novoLOG) injection 5 Units (5 Units Subcutaneous Given 09/16/21 1715)    ED Course/ Medical Decision Making/ A&P                           Medical Decision Making Amount and/or Complexity of Data Reviewed Labs: ordered.  Risk Prescription drug management.   Melissa Bray is here with elevated blood sugar.  Blood sugar in the 400s at home.  Here it is 346.  She is just concerned from some dehydration.  She has history of diabetes and hypertension.  She takes metformin and nighttime insulin.  Overall she appears well.  Denies infectious symptoms.  Vital signs are normal.  We will  get CBC, CMP, lipase, urinalysis, blood gas.  Differential diagnosis is hyperglycemia, dehydration, DKA.  Will give 2 L normal saline bolus.  Per my review and interpretation of labs patient not in DKA.  Blood sugar improving to 296.  She was given 5 units of subcu short acting insulin.  Otherwise no  significant electrolyte abnormality, leukocytosis or anemia or AKI.  She is feeling much better after IV fluids.  Recommend close follow-up with primary care doctor to further discuss diabetes medications.  Discharged in good condition.  This chart was dictated using voice recognition software.  Despite best efforts to proofread,  errors can occur which can change the documentation meaning.         Final Clinical Impression(s) / ED Diagnoses Final diagnoses:  Hyperglycemia    Rx / DC Orders ED Discharge Orders     None         Lennice Sites, DO 09/16/21 1735

## 2021-09-20 ENCOUNTER — Ambulatory Visit: Payer: BC Managed Care – PPO | Admitting: Nurse Practitioner

## 2021-09-20 ENCOUNTER — Encounter: Payer: Self-pay | Admitting: Nurse Practitioner

## 2021-09-20 VITALS — BP 134/100 | HR 86 | Temp 96.8°F | Ht 65.0 in | Wt 281.8 lb

## 2021-09-20 DIAGNOSIS — E1165 Type 2 diabetes mellitus with hyperglycemia: Secondary | ICD-10-CM | POA: Diagnosis not present

## 2021-09-20 DIAGNOSIS — I10 Essential (primary) hypertension: Secondary | ICD-10-CM

## 2021-09-20 MED ORDER — OZEMPIC (0.25 OR 0.5 MG/DOSE) 2 MG/1.5ML ~~LOC~~ SOPN: PEN_INJECTOR | SUBCUTANEOUS | 0 refills | Status: DC

## 2021-09-20 NOTE — Progress Notes (Signed)
Established Patient Visit  Patient: Melissa Bray   DOB: [birth year]   61 y.o. Female  MRN: [id number] Visit Date: 09/20/2021  Subjective:    Chief Complaint  Patient presents with   [facility] F/u: ED Visit 09/16/2021 Unable to get Rybelsus No other concerns   HPI Type 2 diabetes mellitus with hyperglycemia, without long-term current use of insulin (HCC) Uncontrolled, hgbA1c at 10.8% Recent ED visit due to hyperglycemia: 300s This morning she reports glucose was 150. She did not start rybelsus as prescribed but to insurance coverage. She currently take metformin '850mg'$  BID and lantus 15units at hs. We discussed switching rybelsus to ozempic injection. She agreed. Ozempic PA initiated today. Maintain lantus and metformin dose Advised to schedule appt for DM eye exam. Have report faxed to me. Advised to maintain low carb/low fat/low sugar diet. F/up in 22month Essential hypertension Not at goal She does not check BP at home Reports she is compliant with medications: amlodipine, clonidine, metoprolol, and lisinopril She missed doses this AM. BP Readings from Last 3 Encounters:  09/20/21 (!) 134/100  09/16/21 (!) 148/90  08/14/21 (!) 144/92   Advised to monitor BP at home and send via mychart in 1week Maintain current med dose F/up in 152monthReviewed medical, surgical, and social history today  Medications: Outpatient Medications Prior to Visit  Medication Sig   alendronate (FOSAMAX) 70 MG tablet Take 1 tablet (70 mg total) by mouth once a week. Take with a full glass of water on an empty stomach.   amLODipine (NORVASC) 5 MG tablet Take 1 tablet (5 mg total) by mouth at bedtime.   anastrozole (ARIMIDEX) 1 MG tablet Take 1 mg by mouth daily.   cloNIDine (CATAPRES) 0.1 MG tablet Take 1 tablet (0.1 mg total) by mouth 2 (two) times daily.   glipiZIDE (GLUCOTROL) 5 MG tablet Take 5 mg by mouth 2 (two) times daily.   hydrOXYzine (VISTARIL) 25 MG  capsule Take 2 capsules (50 mg total) by mouth 3 (three) times daily as needed for anxiety. No additional refills without office visit   insulin glargine (LANTUS) 100 UNIT/ML Solostar Pen Inject 15 Units into the skin at bedtime.   Insulin Pen Needle (PEN NEEDLES) 31G X 6 MM MISC 1 application by Does not apply route at bedtime.   lisinopril (ZESTRIL) 40 MG tablet Take 1 tablet (40 mg total) by mouth daily.   metFORMIN (GLUCOPHAGE) 850 MG tablet Take 1 tablet (850 mg total) by mouth 2 (two) times daily with a meal.   metoprolol succinate (TOPROL-XL) 100 MG 24 hr tablet Take 1 tablet (100 mg total) by mouth daily. Take with or immediately following a meal.   pravastatin (PRAVACHOL) 20 MG tablet TAKE 1 TABLET BY MOUTH EVERY DAY   traMADol (ULTRAM) 50 MG tablet Take 1 tablet (50 mg total) by mouth every 6 (six) hours as needed.   [DISCONTINUED] Semaglutide (RYBELSUS) 3 MG TABS Take 3 mg by mouth daily. (Patient not taking: Reported on 09/20/2021)   No facility-administered medications prior to visit.   Reviewed past medical and social history.   ROS per HPI above  Last CBC Lab Results  Component Value Date   WBC 6.4 09/16/2021   HGB 14.6 09/16/2021   HCT 43.0 09/16/2021   MCV 85.0 09/16/2021   MCH 28.1 09/16/2021   RDW 13.6 09/16/2021   PLT 173 09/16/2021   Last  metabolic panel Lab Results  Component Value Date   GLUCOSE 356 (H) 09/16/2021   NA 137 09/16/2021   K 3.8 09/16/2021   CL 101 09/16/2021   CO2 27 09/16/2021   BUN 15 09/16/2021   CREATININE 0.81 09/16/2021   GFRNONAA >60 09/16/2021   CALCIUM 9.7 09/16/2021   PROT 7.6 09/16/2021   ALBUMIN 3.9 09/16/2021   BILITOT 0.3 09/16/2021   ALKPHOS 86 09/16/2021   AST 33 09/16/2021   ALT 35 09/16/2021   ANIONGAP 6 09/16/2021      Objective:  BP (!) 134/100 (BP Location: Right Arm, Patient Position: Sitting, Cuff Size: Normal)   Pulse 86   Temp (!) 96.8 F (36 C) (Temporal)   Ht '5\' 5"'$  (1.651 m)   Wt 281 lb 12.8 oz (127.8  kg)   SpO2 92%   BMI 46.89 kg/m      Physical Exam Constitutional:      General: She is not in acute distress.    Appearance: She is obese.  Cardiovascular:     Rate and Rhythm: Normal rate.     Pulses: Normal pulses.  Pulmonary:     Effort: Pulmonary effort is normal.  Musculoskeletal:     Right lower leg: No edema.     Left lower leg: No edema.  Neurological:     Mental Status: She is alert and oriented to person, place, and time.     No results found for any visits on 09/20/21.    Assessment & Plan:    Problem List Items Addressed This Visit       Cardiovascular and Mediastinum   Essential hypertension    Not at goal She does not check BP at home Reports she is compliant with medications: amlodipine, clonidine, metoprolol, and lisinopril She missed doses this AM. BP Readings from Last 3 Encounters:  09/20/21 (!) 134/100  09/16/21 (!) 148/90  08/14/21 (!) 144/92   Advised to monitor BP at home and send via mychart in 1week Maintain current med dose F/up in 92month       Endocrine   Type 2 diabetes mellitus with hyperglycemia, without long-term current use of insulin (HCC) - Primary    Uncontrolled, hgbA1c at 10.8% Recent ED visit due to hyperglycemia: 300s This morning she reports glucose was 150. She did not start rybelsus as prescribed but to insurance coverage. She currently take metformin '850mg'$  BID and lantus 15units at hs. We discussed switching rybelsus to ozempic injection. She agreed. Ozempic PA initiated today. Maintain lantus and metformin dose Advised to schedule appt for DM eye exam. Have report faxed to me. Advised to maintain low carb/low fat/low sugar diet. F/up in 145month    Relevant Medications   glipiZIDE (GLUCOTROL) 5 MG tablet   Semaglutide,0.25 or 0.'5MG'$ /DOS, (OZEMPIC, 0.25 OR 0.5 MG/DOSE,) 2 MG/1.5ML SOPN     Other   Morbid obesity (HCC)   Relevant Medications   glipiZIDE (GLUCOTROL) 5 MG tablet   Semaglutide,0.25 or  0.'5MG'$ /DOS, (OZEMPIC, 0.25 OR 0.5 MG/DOSE,) 2 MG/1.5ML SOPN   Return in about 4 weeks (around 10/18/2021) for DM and HTN (complete PHQ/GAD).     ChWilfred LacyNP

## 2021-09-20 NOTE — Assessment & Plan Note (Addendum)
Uncontrolled, hgbA1c at 10.8% Recent ED visit due to hyperglycemia: 300s This morning she reports glucose was 150. She did not start rybelsus as prescribed but to insurance coverage. She currently take metformin '850mg'$  BID and lantus 15units at hs. We discussed switching rybelsus to ozempic injection. She agreed. Ozempic PA initiated today. Maintain lantus and metformin dose Advised to schedule appt for DM eye exam. Have report faxed to me. Advised to maintain low carb/low fat/low sugar diet. F/up in 27month

## 2021-09-20 NOTE — Patient Instructions (Addendum)
Switch rybelsus to ozempic injection Waiting for your insurance to respond to PA request. Maintain diabetic diet Monitor BP at home once a day in AM Send BP reading via mychart in 1week. Schedule appt for DM eye exam. Have report faxed to me.  Semaglutide Injection What is this medication? SEMAGLUTIDE (SEM a GLOO tide) treats type 2 diabetes. It works by increasing insulin levels in your body, which decreases your blood sugar (glucose). It also reduces the amount of sugar released into the blood and slows down your digestion. It can also be used to lower the risk of heart attack and stroke in people with type 2 diabetes. Changes to diet and exercise are often combined with this medication. This medicine may be used for other purposes; ask your health care provider or pharmacist if you have questions. COMMON BRAND NAME(S): OZEMPIC What should I tell my care team before I take this medication? They need to know if you have any of these conditions: Endocrine tumors (MEN 2) or if someone in your family had these tumors Eye disease, vision problems History of pancreatitis Kidney disease Stomach problems Thyroid cancer or if someone in your family had thyroid cancer An unusual or allergic reaction to semaglutide, other medications, foods, dyes, or preservatives Pregnant or trying to get pregnant Breast-feeding How should I use this medication? This medication is for injection under the skin of your upper leg (thigh), stomach area, or upper arm. It is given once every week (every 7 days). You will be taught how to prepare and give this medication. Use exactly as directed. Take your medication at regular intervals. Do not take it more often than directed. If you use this medication with insulin, you should inject this medication and the insulin separately. Do not mix them together. Do not give the injections right next to each other. Change (rotate) injection sites with each injection. It is  important that you put your used needles and syringes in a special sharps container. Do not put them in a trash can. If you do not have a sharps container, call your pharmacist or care team to get one. A special MedGuide will be given to you by the pharmacist with each prescription and refill. Be sure to read this information carefully each time. This medication comes with INSTRUCTIONS FOR USE. Ask your pharmacist for directions on how to use this medication. Read the information carefully. Talk to your pharmacist or care team if you have questions. Talk to your care team about the use of this medication in children. Special care may be needed. Overdosage: If you think you have taken too much of this medicine contact a poison control center or emergency room at once. NOTE: This medicine is only for you. Do not share this medicine with others. What if I miss a dose? If you miss a dose, take it as soon as you can within 5 days after the missed dose. Then take your next dose at your regular weekly time. If it has been longer than 5 days after the missed dose, do not take the missed dose. Take the next dose at your regular time. Do not take double or extra doses. If you have questions about a missed dose, contact your care team for advice. What may interact with this medication? Other medications for diabetes Many medications may cause changes in blood sugar, these include: Alcohol containing beverages Antiviral medications for HIV or AIDS Aspirin and aspirin-like medications Certain medications for blood pressure, heart disease, irregular  heart beat Chromium Diuretics Female hormones, such as estrogens or progestins, birth control pills Fenofibrate Gemfibrozil Isoniazid Lanreotide Female hormones or anabolic steroids MAOIs like Carbex, Eldepryl, Marplan, Nardil, and Parnate Medications for weight loss Medications for allergies, asthma, cold, or cough Medications for depression, anxiety, or  psychotic disturbances Niacin Nicotine NSAIDs, medications for pain and inflammation, like ibuprofen or naproxen Octreotide Pasireotide Pentamidine Phenytoin Probenecid Quinolone antibiotics such as ciprofloxacin, levofloxacin, ofloxacin Some herbal dietary supplements Steroid medications such as prednisone or cortisone Sulfamethoxazole; trimethoprim Thyroid hormones Some medications can hide the warning symptoms of low blood sugar (hypoglycemia). You may need to monitor your blood sugar more closely if you are taking one of these medications. These include: Beta-blockers, often used for high blood pressure or heart problems (examples include atenolol, metoprolol, propranolol) Clonidine Guanethidine Reserpine This list may not describe all possible interactions. Give your health care provider a list of all the medicines, herbs, non-prescription drugs, or dietary supplements you use. Also tell them if you smoke, drink alcohol, or use illegal drugs. Some items may interact with your medicine. What should I watch for while using this medication? Visit your care team for regular checks on your progress. Drink plenty of fluids while taking this medication. Check with your care team if you get an attack of severe diarrhea, nausea, and vomiting. The loss of too much body fluid can make it dangerous for you to take this medication. A test called the HbA1C (A1C) will be monitored. This is a simple blood test. It measures your blood sugar control over the last 2 to 3 months. You will receive this test every 3 to 6 months. Learn how to check your blood sugar. Learn the symptoms of low and high blood sugar and how to manage them. Always carry a quick-source of sugar with you in case you have symptoms of low blood sugar. Examples include hard sugar candy or glucose tablets. Make sure others know that you can choke if you eat or drink when you develop serious symptoms of low blood sugar, such as seizures  or unconsciousness. They must get medical help at once. Tell your care team if you have high blood sugar. You might need to change the dose of your medication. If you are sick or exercising more than usual, you might need to change the dose of your medication. Do not skip meals. Ask your care team if you should avoid alcohol. Many nonprescription cough and cold products contain sugar or alcohol. These can affect blood sugar. Pens should never be shared. Even if the needle is changed, sharing may result in passing of viruses like hepatitis or HIV. Wear a medical ID bracelet or chain, and carry a card that describes your disease and details of your medication and dosage times. Do not become pregnant while taking this medication. Women should inform their care team if they wish to become pregnant or think they might be pregnant. There is a potential for serious side effects to an unborn child. Talk to your care team for more information. What side effects may I notice from receiving this medication? Side effects that you should report to your care team as soon as possible: Allergic reactions--skin rash, itching, hives, swelling of the face, lips, tongue, or throat Change in vision Dehydration--increased thirst, dry mouth, feeling faint or lightheaded, headache, dark yellow or brown urine Gallbladder problems--severe stomach pain, nausea, vomiting, fever Heart palpitations--rapid, pounding, or irregular heartbeat Kidney injury--decrease in the amount of urine, swelling of the ankles,  hands, or feet Pancreatitis--severe stomach pain that spreads to your back or gets worse after eating or when touched, fever, nausea, vomiting Thyroid cancer--new mass or lump in the neck, pain or trouble swallowing, trouble breathing, hoarseness Side effects that usually do not require medical attention (report to your care team if they continue or are bothersome): Diarrhea Loss of appetite Nausea Stomach  pain Vomiting This list may not describe all possible side effects. Call your doctor for medical advice about side effects. You may report side effects to FDA at 1-800-FDA-1088. Where should I keep my medication? Keep out of the reach of children. Store unopened pens in a refrigerator between 2 and 8 degrees C (36 and 46 degrees F). Do not freeze. Protect from light and heat. After you first use the pen, it can be stored for 56 days at room temperature between 15 and 30 degrees C (59 and 86 degrees F) or in a refrigerator. Throw away your used pen after 56 days or after the expiration date, whichever comes first. Do not store your pen with the needle attached. If the needle is left on, medication may leak from the pen. NOTE: This sheet is a summary. It may not cover all possible information. If you have questions about this medicine, talk to your doctor, pharmacist, or health care provider.  2023 Elsevier/Gold Standard (2020-06-22 00:00:00)

## 2021-09-20 NOTE — Assessment & Plan Note (Addendum)
Not at goal She does not check BP at home Reports she is compliant with medications: amlodipine, clonidine, metoprolol, and lisinopril She missed doses this AM. BP Readings from Last 3 Encounters:  09/20/21 (!) 134/100  09/16/21 (!) 148/90  08/14/21 (!) 144/92   Advised to monitor BP at home and send via mychart in 1week Maintain current med dose F/up in 77month

## 2021-09-28 ENCOUNTER — Telehealth: Payer: Self-pay

## 2021-09-28 NOTE — Telephone Encounter (Signed)
Called & spoke w/ pt, adv of Ozempic approval

## 2021-10-22 ENCOUNTER — Other Ambulatory Visit: Payer: Self-pay | Admitting: Nurse Practitioner

## 2021-10-22 DIAGNOSIS — E1165 Type 2 diabetes mellitus with hyperglycemia: Secondary | ICD-10-CM

## 2021-10-22 NOTE — Telephone Encounter (Signed)
Chart supports Rx Last OV: 08/2021 Next OV: Not scheduled

## 2021-10-29 ENCOUNTER — Encounter: Payer: Self-pay | Admitting: Gastroenterology

## 2021-11-19 ENCOUNTER — Ambulatory Visit (AMBULATORY_SURGERY_CENTER): Payer: Self-pay | Admitting: *Deleted

## 2021-11-19 VITALS — Ht 65.0 in | Wt 273.2 lb

## 2021-11-19 DIAGNOSIS — Z1211 Encounter for screening for malignant neoplasm of colon: Secondary | ICD-10-CM

## 2021-11-19 MED ORDER — NA SULFATE-K SULFATE-MG SULF 17.5-3.13-1.6 GM/177ML PO SOLN: 1.0000 | Freq: Once | ORAL | 0 refills | Status: AC

## 2021-11-19 NOTE — Progress Notes (Signed)
No egg or soy allergy known to patient  No issues known to pt with past sedation with any surgeries or procedures Patient denies ever being told they had issues or difficulty with intubation  No FH of Malignant Hyperthermia Pt is not on diet pills Pt is not on  home 02  Pt is not on blood thinners  Pt denies issues with constipation  No A fib or A flutter Have any cardiac testing pending--no Pt instructed to use [email or web address] or GoodRx for a price reduction on prep   

## 2021-11-28 ENCOUNTER — Encounter: Payer: Self-pay | Admitting: Gastroenterology

## 2021-12-04 ENCOUNTER — Encounter: Payer: Self-pay | Admitting: Nurse Practitioner

## 2021-12-04 ENCOUNTER — Ambulatory Visit: Payer: BC Managed Care – PPO | Admitting: Nurse Practitioner

## 2021-12-04 VITALS — BP 136/86 | HR 95 | Temp 96.4°F | Ht 65.0 in | Wt 274.0 lb

## 2021-12-04 DIAGNOSIS — G620 Drug-induced polyneuropathy: Secondary | ICD-10-CM | POA: Diagnosis not present

## 2021-12-04 DIAGNOSIS — E1169 Type 2 diabetes mellitus with other specified complication: Secondary | ICD-10-CM

## 2021-12-04 DIAGNOSIS — E1165 Type 2 diabetes mellitus with hyperglycemia: Secondary | ICD-10-CM | POA: Diagnosis not present

## 2021-12-04 DIAGNOSIS — T451X5A Adverse effect of antineoplastic and immunosuppressive drugs, initial encounter: Secondary | ICD-10-CM

## 2021-12-04 DIAGNOSIS — I10 Essential (primary) hypertension: Secondary | ICD-10-CM | POA: Diagnosis not present

## 2021-12-04 LAB — RENAL FUNCTION PANEL
Albumin: 4 g/dL (ref 3.5–5.2)
BUN: 13 mg/dL (ref 6–23)
CO2: 28 mEq/L (ref 19–32)
Calcium: 9.6 mg/dL (ref 8.4–10.5)
Chloride: 101 mEq/L (ref 96–112)
Creatinine, Ser: 0.78 mg/dL (ref 0.40–1.20)
GFR: 81.03 mL/min (ref >60.00)
Glucose, Bld: 204 mg/dL — ABNORMAL HIGH (ref 70–99)
Phosphorus: 3 mg/dL (ref 2.3–4.6)
Potassium: 4.5 mEq/L (ref 3.5–5.1)
Sodium: 136 mEq/L (ref 135–145)

## 2021-12-04 LAB — HEMOGLOBIN A1C: Hgb A1c MFr Bld: 10.2 % — ABNORMAL HIGH (ref 4.6–6.5)

## 2021-12-04 MED ORDER — PRAVASTATIN SODIUM 20 MG PO TABS: 20.0000 mg | ORAL_TABLET | Freq: Every day | ORAL | 3 refills | Status: DC

## 2021-12-04 MED ORDER — METOPROLOL SUCCINATE ER 100 MG PO TB24: 100.0000 mg | ORAL_TABLET | Freq: Every day | ORAL | 3 refills | Status: DC

## 2021-12-04 MED ORDER — OZEMPIC (0.25 OR 0.5 MG/DOSE) 2 MG/3ML ~~LOC~~ SOPN: 0.5000 mg | PEN_INJECTOR | SUBCUTANEOUS | 0 refills | Status: DC

## 2021-12-04 MED ORDER — GLIPIZIDE ER 5 MG PO TB24: 5.0000 mg | ORAL_TABLET | Freq: Every day | ORAL | 3 refills | Status: DC

## 2021-12-04 MED ORDER — OZEMPIC (1 MG/DOSE) 4 MG/3ML ~~LOC~~ SOPN: 1.7000 mg | PEN_INJECTOR | SUBCUTANEOUS | 0 refills | Status: DC

## 2021-12-04 NOTE — Assessment & Plan Note (Signed)
Elevated Bp at home due to absence of metoprolol. Home BP reading: 150/100, 145/99. BP Readings from Last 3 Encounters:  12/04/21 136/86  09/20/21 (!) 134/100  09/16/21 (!) 148/90   Resume metoprolol Repeat BMP Maintain lisinopril, amlodipine, and clonidine

## 2021-12-04 NOTE — Progress Notes (Signed)
Established Patient Visit  Patient: Melissa Bray   DOB: May 14, 1960   61 y.o. Female  MRN: [id number] Visit Date: 12/04/2021  Subjective:    Chief Complaint  Patient presents with   Office Visit    DM/ HTN f/u Says she checks her BP multiple times a day b/c it has been running high Checks BS daily & they have been normal  Constipation with ozempic No other concerns  TDAP & flu shot given today    HPI Essential hypertension Elevated Bp at home due to absence of metoprolol. Home BP reading: 150/100, 145/99. BP Readings from Last 3 Encounters:  12/04/21 136/86  09/20/21 (!) 134/100  09/16/21 (!) 148/90   Resume metoprolol Repeat BMP Maintain lisinopril, amlodipine, and clonidine  Type 2 diabetes mellitus with hyperglycemia, without long-term current use of insulin (HCC) Fasting Home glucose: 100s Will schedule eye exam. Reports constipation with ozempic 0.'5mg'$ , improves with miralax. lantus on hold Take metformin BID. No change in weight.  Repeat hgbA1c, renal function today: hgbA1c at 10.2%: no improvement Increase ozempic to 1.7% weekly Maintain metformin and glipizide dose F/up in 73month for DM    Wt Readings from Last 3 Encounters:  12/04/21 274 lb (124.3 kg)  11/19/21 273 lb 3.2 oz (123.9 kg)  09/20/21 281 lb 12.8 oz (127.8 kg)    Reviewed medical, surgical, and social history today  Medications: Outpatient Medications Prior to Visit  Medication Sig   alendronate (FOSAMAX) 70 MG tablet Take 1 tablet (70 mg total) by mouth once a week. Take with a full glass of water on an empty stomach.   amLODipine (NORVASC) 5 MG tablet Take 1 tablet (5 mg total) by mouth at bedtime.   anastrozole (ARIMIDEX) 1 MG tablet Take 1 mg by mouth daily.   cloNIDine (CATAPRES) 0.1 MG tablet Take 1 tablet (0.1 mg total) by mouth 2 (two) times daily.   hydrOXYzine (VISTARIL) 25 MG capsule Take 2 capsules (50 mg total) by mouth 3 (three) times daily as needed for anxiety.  No additional refills without office visit   Insulin Pen Needle (PEN NEEDLES) 31G X 6 MM MISC 1 application by Does not apply route at bedtime.   lisinopril (ZESTRIL) 40 MG tablet Take 1 tablet (40 mg total) by mouth daily.   traMADol (ULTRAM) 50 MG tablet Take 1 tablet (50 mg total) by mouth every 6 (six) hours as needed.   [DISCONTINUED] glipiZIDE (GLUCOTROL) 5 MG tablet Take 5 mg by mouth 2 (two) times daily.   [DISCONTINUED] OZEMPIC, 0.25 OR 0.5 MG/DOSE, 2 MG/3ML SOPN INJECT 0.'25MG'$  SUBCUTANEOUSLY ONCE A WEEK FOR 2 WEEKS, THEN INCREASE TO 0.'5MG'$  WEEKLY UNTIL NEXT APPOINTMENT.   [DISCONTINUED] pravastatin (PRAVACHOL) 20 MG tablet TAKE 1 TABLET BY MOUTH EVERY DAY   metFORMIN (GLUCOPHAGE) 850 MG tablet Take 1 tablet (850 mg total) by mouth 2 (two) times daily with a meal. (Patient not taking: Reported on 11/19/2021)   [DISCONTINUED] insulin glargine (LANTUS) 100 UNIT/ML Solostar Pen Inject 15 Units into the skin at bedtime. (Patient not taking: Reported on 11/19/2021)   [DISCONTINUED] metoprolol succinate (TOPROL-XL) 100 MG 24 hr tablet Take 1 tablet (100 mg total) by mouth daily. Take with or immediately following a meal. (Patient not taking: Reported on 11/19/2021)   No facility-administered medications prior to visit.   Reviewed past medical and social history.   ROS per HPI above      Objective:  BP  136/86 (BP Location: Right Arm, Patient Position: Sitting, Cuff Size: Normal)   Pulse 95   Temp (!) 96.4 F (35.8 C) (Temporal)   Ht '5\' 5"'$  (1.651 m)   Wt 274 lb (124.3 kg)   SpO2 98%   BMI 45.60 kg/m      Physical Exam  Results for orders placed or performed in visit on 12/04/21  Hemoglobin A1c  Result Value Ref Range   Hgb A1c MFr Bld 10.2 (H) 4.6 - 6.5 %  Renal Function Panel  Result Value Ref Range   Sodium 136 135 - 145 mEq/L   Potassium 4.5 3.5 - 5.1 mEq/L   Chloride 101 96 - 112 mEq/L   CO2 28 19 - 32 mEq/L   Albumin 4.0 3.5 - 5.2 g/dL   BUN 13 6 - 23 mg/dL   Creatinine,  Ser 0.78 0.40 - 1.20 mg/dL   Glucose, Bld 204 (H) 70 - 99 mg/dL   Phosphorus 3.0 2.3 - 4.6 mg/dL   GFR 81.03 >60.00 mL/min   Calcium 9.6 8.4 - 10.5 mg/dL      Assessment & Plan:    Problem List Items Addressed This Visit       Cardiovascular and Mediastinum   Essential hypertension    Elevated Bp at home due to absence of metoprolol. Home BP reading: 150/100, 145/99. BP Readings from Last 3 Encounters:  12/04/21 136/86  09/20/21 (!) 134/100  09/16/21 (!) 148/90   Resume metoprolol Repeat BMP Maintain lisinopril, amlodipine, and clonidine      Relevant Medications   metoprolol succinate (TOPROL-XL) 100 MG 24 hr tablet   pravastatin (PRAVACHOL) 20 MG tablet   Other Relevant Orders   Renal Function Panel (Completed)     Endocrine   Type 2 diabetes mellitus with hyperglycemia, without long-term current use of insulin (HCC) - Primary    Fasting Home glucose: 100s Will schedule eye exam. Reports constipation with ozempic 0.'5mg'$ , improves with miralax. lantus on hold Take metformin BID. No change in weight.  Repeat hgbA1c, renal function today: hgbA1c at 10.2%: no improvement Increase ozempic to 1.7% weekly Maintain metformin and glipizide dose F/up in 71month for DM       Relevant Medications   Semaglutide, 1 MG/DOSE, (OZEMPIC, 1 MG/DOSE,) 4 MG/3ML SOPN   pravastatin (PRAVACHOL) 20 MG tablet   glipiZIDE (GLUCOTROL XL) 5 MG 24 hr tablet   Other Relevant Orders   Hemoglobin A1c (Completed)   Renal Function Panel (Completed)     Nervous and Auditory   Neuropathy due to chemotherapeutic drug ([place]   Other Visit Diagnoses     Type 2 diabetes mellitus with other specified complication, without long-term current use of insulin (HCC)       Relevant Medications   Semaglutide, 1 MG/DOSE, (OZEMPIC, 1 MG/DOSE,) 4 MG/3ML SOPN   pravastatin (PRAVACHOL) 20 MG tablet   glipiZIDE (GLUCOTROL XL) 5 MG 24 hr tablet      Return in about 4 weeks (around 01/01/2022) for DM, HTN,  hyperlipidemia (fasting).     CWilfred Lacy NP

## 2021-12-04 NOTE — Assessment & Plan Note (Addendum)
Fasting Home glucose: 100s Will schedule eye exam. Reports constipation with ozempic 0.'5mg'$ , improves with miralax. lantus on hold Take metformin BID. No change in weight.  Repeat hgbA1c, renal function today: hgbA1c at 10.2%: no improvement Increase ozempic to 1.7% weekly Maintain metformin and glipizide dose F/up in 78month for DM

## 2021-12-04 NOTE — Patient Instructions (Addendum)
Go to lab Maintain ozempic dose at 0.'5mg'$  weekly Use miralax 17g daily for constipation. Resume metoprolol

## 2021-12-10 ENCOUNTER — Ambulatory Visit (AMBULATORY_SURGERY_CENTER): Payer: BC Managed Care – PPO | Admitting: Gastroenterology

## 2021-12-10 ENCOUNTER — Encounter: Payer: Self-pay | Admitting: Gastroenterology

## 2021-12-10 VITALS — BP 167/74 | HR 85 | Temp 97.1°F | Resp 22 | Wt 273.2 lb

## 2021-12-10 DIAGNOSIS — D123 Benign neoplasm of transverse colon: Secondary | ICD-10-CM

## 2021-12-10 DIAGNOSIS — D120 Benign neoplasm of cecum: Secondary | ICD-10-CM

## 2021-12-10 DIAGNOSIS — K635 Polyp of colon: Secondary | ICD-10-CM | POA: Diagnosis not present

## 2021-12-10 DIAGNOSIS — Z1211 Encounter for screening for malignant neoplasm of colon: Secondary | ICD-10-CM | POA: Diagnosis not present

## 2021-12-10 MED ORDER — SODIUM CHLORIDE 0.9 % IV SOLN: 500.0000 mL | Freq: Once | INTRAVENOUS | Status: DC

## 2021-12-10 NOTE — Progress Notes (Signed)
Referring Provider: Flossie Buffy, NP Primary Care Physician:  Flossie Buffy, NP  Indication for Procedure:  Colon cancer screening   IMPRESSION:  Need for colon cancer screening Appropriate candidate for monitored anesthesia care  PLAN: Colonoscopy in the [facility] today   HPI: Melissa Bray is a 61 y.o. female presents for colonoscopy for colon cancer screening.   No known family history of colon cancer or polyps. No family history of uterine/endometrial cancer, pancreatic cancer or gastric/stomach cancer.   Past Medical History:  Diagnosis Date   Anxiety    Breast cancer ([place])    Diabetes mellitus without complication ([place])    Heart murmur    age 89   Hyperlipidemia    Hypertension     Past Surgical History:  Procedure Laterality Date   ABDOMINAL HYSTERECTOMY     BREAST BIOPSY     MASTECTOMY Left 2016    Current Outpatient Medications  Medication Sig Dispense Refill   amLODipine (NORVASC) 5 MG tablet Take 1 tablet (5 mg total) by mouth at bedtime. 90 tablet 3   anastrozole (ARIMIDEX) 1 MG tablet Take 1 mg by mouth daily.     cloNIDine (CATAPRES) 0.1 MG tablet Take 1 tablet (0.1 mg total) by mouth 2 (two) times daily. 180 tablet 3   glipiZIDE (GLUCOTROL XL) 5 MG 24 hr tablet Take 1 tablet (5 mg total) by mouth daily with breakfast. 90 tablet 3   hydrOXYzine (VISTARIL) 25 MG capsule Take 2 capsules (50 mg total) by mouth 3 (three) times daily as needed for anxiety. No additional refills without office visit 90 capsule 1   lisinopril (ZESTRIL) 40 MG tablet Take 1 tablet (40 mg total) by mouth daily. 90 tablet 3   metFORMIN (GLUCOPHAGE) 850 MG tablet Take 1 tablet (850 mg total) by mouth 2 (two) times daily with a meal. 180 tablet 3   metoprolol succinate (TOPROL-XL) 100 MG 24 hr tablet Take 1 tablet (100 mg total) by mouth daily. Take with or immediately following a meal. 90 tablet 3   pravastatin (PRAVACHOL) 20 MG tablet Take 1 tablet (20 mg total) by mouth  daily. 90 tablet 3   alendronate (FOSAMAX) 70 MG tablet Take 1 tablet (70 mg total) by mouth once a week. Take with a full glass of water on an empty stomach. 12 tablet 3   Insulin Pen Needle (PEN NEEDLES) 31G X 6 MM MISC 1 application by Does not apply route at bedtime. 90 each 0   Semaglutide, 1 MG/DOSE, (OZEMPIC, 1 MG/DOSE,) 4 MG/3ML SOPN Inject 1.7 mg into the skin once a week. 15 mL 0   traMADol (ULTRAM) 50 MG tablet Take 1 tablet (50 mg total) by mouth every 6 (six) hours as needed. 20 tablet 0   Current Facility-Administered Medications  Medication Dose Route Frequency Provider Last Rate Last Admin   0.9 %  sodium chloride infusion  500 mL Intravenous Once Thornton Park, MD        Allergies as of 12/10/2021   (No Known Allergies)    Family History  Problem Relation Age of Onset   Asthma Mother    Cancer Father        prostate cancer   Diabetes Father    Hypertension Father    Cancer Brother        prostate cancer   Asthma Brother    Colon cancer Neg Hx    Colon polyps Neg Hx    Crohn's disease Neg Hx  Esophageal cancer Neg Hx    Rectal cancer Neg Hx    Stomach cancer Neg Hx    Ulcerative colitis Neg Hx      Physical Exam: General:   Alert,  well-nourished, pleasant and cooperative in NAD Head:  Normocephalic and atraumatic. Eyes:  Sclera clear, no icterus.   Conjunctiva pink. Mouth:  No deformity or lesions.   Neck:  Supple; no masses or thyromegaly. Lungs:  Clear throughout to auscultation.   No wheezes. Heart:  Regular rate and rhythm; no murmurs. Abdomen:  Soft, non-tender, nondistended, normal bowel sounds, no rebound or guarding.  Msk:  Symmetrical. No boney deformities LAD: No inguinal or umbilical LAD Extremities:  No clubbing or edema. Neurologic:  Alert and  oriented x4;  grossly nonfocal Skin:  No obvious rash or bruise. Psych:  Alert and cooperative. Normal mood and affect.     Studies/Results: No results found.    Luwanna Brossman L.  Tarri Glenn, MD, MPH 12/10/2021, 10:58 AM

## 2021-12-10 NOTE — Progress Notes (Signed)
To pacu, VSS. Report to [email or web address] 

## 2021-12-10 NOTE — Patient Instructions (Signed)
Resume previous diet and medications. Awaiting pathology results. Repeat Colonoscopy date to be determined based on pathology results.  YOU HAD AN ENDOSCOPIC PROCEDURE TODAY AT THE [facility]:   Refer to the procedure report that was given to you for any specific questions about what was found during the examination.  If the procedure report does not answer your questions, please call your gastroenterologist to clarify.  If you requested that your care partner not be given the details of your procedure findings, then the procedure report has been included in a sealed envelope for you to review at your convenience later.  YOU SHOULD EXPECT: Some feelings of bloating in the abdomen. Passage of more gas than usual.  Walking can help get rid of the air that was put into your GI tract during the procedure and reduce the bloating. If you had a lower endoscopy (such as a colonoscopy or flexible sigmoidoscopy) you may notice spotting of blood in your stool or on the toilet paper. If you underwent a bowel prep for your procedure, you may not have a normal bowel movement for a few days.  Please Note:  You might notice some irritation and congestion in your nose or some drainage.  This is from the oxygen used during your procedure.  There is no need for concern and it should clear up in a day or so.  SYMPTOMS TO REPORT IMMEDIATELY:  Following lower endoscopy (colonoscopy or flexible sigmoidoscopy):  Excessive amounts of blood in the stool  Significant tenderness or worsening of abdominal pains  Swelling of the abdomen that is new, acute  Fever of 100F or higher   For urgent or emergent issues, a gastroenterologist can be reached at any hour by calling [phone]. Do not use MyChart messaging for urgent concerns.    DIET:  We do recommend a small meal at first, but then you may proceed to your regular diet.  Drink plenty of fluids but you should avoid alcoholic beverages for 24  hours.  ACTIVITY:  You should plan to take it easy for the rest of today and you should NOT DRIVE or use heavy machinery until tomorrow (because of the sedation medicines used during the test).    FOLLOW UP: Our staff will call the number listed on your records the next business day following your procedure.  We will call around 7:15- 8:00 am to check on you and address any questions or concerns that you may have regarding the information given to you following your procedure. If we do not reach you, we will leave a message.     If any biopsies were taken you will be contacted by phone or by letter within the next 1-3 weeks.  Please call us at [phone] if you have not heard about the biopsies in 3 weeks.    SIGNATURES/CONFIDENTIALITY: You and/or your care partner have signed paperwork which will be entered into your electronic medical record.  These signatures attest to the fact that that the information above on your After Visit Summary has been reviewed and is understood.  Full responsibility of the confidentiality of this discharge information lies with you and/or your care-partner. 

## 2021-12-10 NOTE — Progress Notes (Signed)
Called to room to assist during endoscopic procedure.  Patient ID and intended procedure confirmed with present staff. Received instructions for my participation in the procedure from the performing physician.  

## 2021-12-10 NOTE — Op Note (Signed)
[facility] Patient Name: Melissa Bray Procedure Date: 12/10/2021 10:44 AM MRN: [id number] Endoscopist: Thornton Park MD, MD Age: 61 Referring MD:  Date of Birth: April 02, 1960 Gender: Female Account #: [id number] Procedure:                Colonoscopy Indications:              Screening for colorectal malignant neoplasm, This                            is the patient's first colonoscopy Medicines:                Monitored Anesthesia Care Procedure:                Pre-Anesthesia Assessment:                           - Prior to the procedure, a History and Physical                            was performed, and patient medications and                            allergies were reviewed. The patient's tolerance of                            previous anesthesia was also reviewed. The risks                            and benefits of the procedure and the sedation                            options and risks were discussed with the patient.                            All questions were answered, and informed consent                            was obtained. Prior Anticoagulants: The patient has                            taken no previous anticoagulant or antiplatelet                            agents. ASA Grade Assessment: III - A patient with                            severe systemic disease. After reviewing the risks                            and benefits, the patient was deemed in                            satisfactory condition to undergo the procedure.  After obtaining informed consent, the colonoscope                            was passed under direct vision. Throughout the                            procedure, the patient's blood pressure, pulse, and                            oxygen saturations were monitored continuously. The                            CF [id number] #[id number] was introduced through the anus                            and advanced to  the 3 cm into the ileum. A second                            forward view of the right colon was performed. The                            colonoscopy was performed without difficulty. The                            patient tolerated the procedure well. The quality                            of the bowel preparation was good. The terminal                            ileum, ileocecal valve, appendiceal orifice, and                            rectum were photographed. Scope In: 11:05:09 AM Scope Out: 11:16:51 AM Scope Withdrawal Time: 0 hours 7 minutes 39 seconds  Total Procedure Duration: 0 hours 11 minutes 42 seconds  Findings:                 The perianal and digital rectal examinations were                            normal.                           A less than 1 mm polyp was found in the cecum. The                            polyp was sessile. The polyp was removed with a                            cold snare. Resection and retrieval were complete.                            Estimated blood loss  was minimal.                           A 2 mm polyp was found in the distal transverse                            colon. The polyp was flat. The polyp was removed                            with a cold snare. Resection and retrieval were                            complete. Estimated blood loss was minimal.                           A few small-mouthed diverticula were found in the                            sigmoid colon.                           The exam was otherwise without abnormality on                            direct and retroflexion views. Complications:            No immediate complications. Estimated Blood Loss:     Estimated blood loss was minimal. Impression:               - One less than 1 mm polyp in the cecum, removed                            with a cold snare. Resected and retrieved.                           - One 2 mm polyp in the distal transverse colon,                             removed with a cold snare. Resected and retrieved.                           - The examination was otherwise normal on direct                            and retroflexion views. Recommendation:           - Patient has a contact number available for                            emergencies. The signs and symptoms of potential                            delayed complications were discussed with the  patient. Return to normal activities tomorrow.                            Written discharge instructions were provided to the                            patient.                           - Resume previous diet. High fiber diet recommended.                           - Continue present medications.                           - Await pathology results.                           - Repeat colonoscopy date to be determined after                            pending pathology results are reviewed for                            surveillance.                           - Emerging evidence supports eating a diet of                            fruits, vegetables, grains, calcium, and yogurt                            while reducing red meat and alcohol may reduce the                            risk of colon cancer.                           - Thank you for allowing me to be involved in your                            colon cancer prevention. Thornton Park MD, MD 12/10/2021 11:22:00 AM This report has been signed electronically.

## 2021-12-10 NOTE — Progress Notes (Signed)
Pt's states no medical or surgical changes since previsit or office visit. 

## 2021-12-11 ENCOUNTER — Telehealth: Payer: Self-pay | Admitting: *Deleted

## 2021-12-11 NOTE — Telephone Encounter (Signed)
  Follow up Call-     12/10/2021   10:53 AM  Call back number  Post procedure Call Back phone  # [phone]  Permission to leave phone message Yes     Patient questions:  Do you have a fever, pain , or abdominal swelling? No. Pain Score  0 *  Have you tolerated food without any problems? Yes.    Have you been able to return to your normal activities? Yes.    Do you have any questions about your discharge instructions: Diet   No. Medications  No. Follow up visit  No.  Do you have questions or concerns about your Care? No.  Actions: * If pain score is 4 or above: No action needed, pain <4.

## 2021-12-16 ENCOUNTER — Encounter: Payer: Self-pay | Admitting: Gastroenterology

## 2021-12-24 ENCOUNTER — Telehealth: Payer: Self-pay | Admitting: Nurse Practitioner

## 2021-12-24 DIAGNOSIS — E1165 Type 2 diabetes mellitus with hyperglycemia: Secondary | ICD-10-CM

## 2021-12-24 NOTE — Telephone Encounter (Signed)
Pt was a little confused. She said her ozempic is out of stock and wants you to call [facility] to see if they have it. I told her she needed to call and let us know which [facility] has it. She gave this number [phone] to call for clarification.

## 2021-12-25 ENCOUNTER — Telehealth: Payer: Self-pay | Admitting: Nurse Practitioner

## 2021-12-25 MED ORDER — OZEMPIC (1 MG/DOSE) 4 MG/3ML ~~LOC~~ SOPN: 1.7000 mg | PEN_INJECTOR | SUBCUTANEOUS | 0 refills | Status: DC

## 2021-12-25 NOTE — Telephone Encounter (Signed)
Please call [phone] to verify the directions for pt's Ozempic.

## 2021-12-25 NOTE — Telephone Encounter (Signed)
Called & verified w patient which [facility] she needed [place] to send this medication to.

## 2021-12-26 ENCOUNTER — Telehealth: Payer: Self-pay | Admitting: Nurse Practitioner

## 2021-12-26 DIAGNOSIS — E1165 Type 2 diabetes mellitus with hyperglycemia: Secondary | ICD-10-CM

## 2021-12-26 MED ORDER — METFORMIN HCL 850 MG PO TABS: 850.0000 mg | ORAL_TABLET | Freq: Two times a day (BID) | ORAL | 0 refills | Status: DC

## 2021-12-26 NOTE — Telephone Encounter (Signed)
Chart supports Rx Last OV: 11/2021 Next OV: not scheduled , needs f/u appt for further refills.

## 2021-12-26 NOTE — Telephone Encounter (Signed)
Caller Name: Derek Call back phone #: [phone]   MEDICATION(S):  metFORMIN (GLUCOPHAGE) 850 MG tablet  Days of Med Remaining:   Has the patient contacted their pharmacy (YES/NO)? no What did pharmacy advise?   Preferred Pharmacy:  [facility], [place] Phone:  [phone]      ~~~Please advise patient/caregiver to allow 2-3 business days to process RX refills.

## 2021-12-31 ENCOUNTER — Other Ambulatory Visit (HOSPITAL_BASED_OUTPATIENT_CLINIC_OR_DEPARTMENT_OTHER): Payer: Self-pay

## 2022-01-01 ENCOUNTER — Telehealth: Payer: Self-pay | Admitting: Nurse Practitioner

## 2022-01-01 DIAGNOSIS — E1165 Type 2 diabetes mellitus with hyperglycemia: Secondary | ICD-10-CM

## 2022-01-01 NOTE — Telephone Encounter (Signed)
Caller Name: [facility] Call back phone #: [phone]  Reason for Call: For ozempic, the instructions were written incorrectly with the dosage

## 2022-01-02 MED ORDER — OZEMPIC (2 MG/DOSE) 8 MG/3ML ~~LOC~~ SOPN: 1.7000 mg | PEN_INJECTOR | SUBCUTANEOUS | 0 refills | Status: DC

## 2022-01-02 NOTE — Telephone Encounter (Signed)
Called & spoke w pharmacy, they stated the word "skin" was spelled wrong on the sig.   Update:  [facility] called back to advise that it needs to be written as "Inject 1 mg into the skin once a week." They are discontinuing this Rx & need a new one

## 2022-01-05 ENCOUNTER — Encounter: Payer: Self-pay | Admitting: Nurse Practitioner

## 2022-01-05 DIAGNOSIS — K635 Polyp of colon: Secondary | ICD-10-CM | POA: Insufficient documentation

## 2022-01-10 ENCOUNTER — Telehealth: Payer: Self-pay | Admitting: Nurse Practitioner

## 2022-01-10 NOTE — Telephone Encounter (Signed)
Morey Hummingbird from [facility] [phone]) stated that the mg has to match the mg in the directions

## 2022-01-15 ENCOUNTER — Other Ambulatory Visit: Payer: Self-pay

## 2022-01-15 DIAGNOSIS — E1165 Type 2 diabetes mellitus with hyperglycemia: Secondary | ICD-10-CM

## 2022-01-15 MED ORDER — OZEMPIC (2 MG/DOSE) 8 MG/3ML ~~LOC~~ SOPN: 2.0000 mg | PEN_INJECTOR | SUBCUTANEOUS | 0 refills | Status: DC

## 2022-01-15 NOTE — Telephone Encounter (Signed)
Called & spoke w/ pharmacist, she states that when the increased wegovy went up, the sig was incorrect, Sig needed to be updated. Sent update RX to pharmacy .

## 2022-02-24 ENCOUNTER — Other Ambulatory Visit: Payer: Self-pay | Admitting: Nurse Practitioner

## 2022-02-24 DIAGNOSIS — F411 Generalized anxiety disorder: Secondary | ICD-10-CM

## 2022-03-05 ENCOUNTER — Other Ambulatory Visit: Payer: Self-pay | Admitting: Nurse Practitioner

## 2022-03-05 DIAGNOSIS — F411 Generalized anxiety disorder: Secondary | ICD-10-CM

## 2022-04-09 ENCOUNTER — Encounter: Payer: BC Managed Care – PPO | Admitting: Nurse Practitioner

## 2022-04-23 ENCOUNTER — Ambulatory Visit: Payer: BC Managed Care – PPO | Admitting: Nurse Practitioner

## 2022-04-23 ENCOUNTER — Encounter: Payer: Self-pay | Admitting: Nurse Practitioner

## 2022-04-23 VITALS — BP 122/82 | HR 82 | Temp 96.2°F | Ht 65.0 in | Wt 266.2 lb

## 2022-04-23 DIAGNOSIS — E785 Hyperlipidemia, unspecified: Secondary | ICD-10-CM | POA: Diagnosis not present

## 2022-04-23 DIAGNOSIS — I10 Essential (primary) hypertension: Secondary | ICD-10-CM | POA: Diagnosis not present

## 2022-04-23 DIAGNOSIS — E1169 Type 2 diabetes mellitus with other specified complication: Secondary | ICD-10-CM | POA: Diagnosis not present

## 2022-04-23 DIAGNOSIS — E1165 Type 2 diabetes mellitus with hyperglycemia: Secondary | ICD-10-CM

## 2022-04-23 DIAGNOSIS — F339 Major depressive disorder, recurrent, unspecified: Secondary | ICD-10-CM

## 2022-04-23 LAB — POCT GLYCOSYLATED HEMOGLOBIN (HGB A1C): Hemoglobin A1C: 13 % — AB (ref 4.0–5.6)

## 2022-04-23 LAB — MICROALBUMIN / CREATININE URINE RATIO
Creatinine,U: 96.4 mg/dL
Microalb Creat Ratio: 13.4 mg/g (ref 0.0–30.0)
Microalb, Ur: 12.9 mg/dL — ABNORMAL HIGH (ref 0.0–1.9)

## 2022-04-23 LAB — LIPID PANEL
Cholesterol: 115 mg/dL (ref 0–200)
HDL: 50.4 mg/dL (ref >39.00)
LDL Cholesterol: 50 mg/dL (ref 0–99)
NonHDL: 64.77
Total CHOL/HDL Ratio: 2
Triglycerides: 75 mg/dL (ref 0.0–149.0)
VLDL: 15 mg/dL (ref 0.0–40.0)

## 2022-04-23 MED ORDER — GLIPIZIDE ER 10 MG PO TB24: 10.0000 mg | ORAL_TABLET | Freq: Every day | ORAL | 3 refills | Status: DC

## 2022-04-23 MED ORDER — PEN NEEDLES 31G X 6 MM MISC: 1.0000 "application " | Freq: Every day | 0 refills | Status: DC

## 2022-04-23 MED ORDER — AMLODIPINE BESYLATE 5 MG PO TABS: 5.0000 mg | ORAL_TABLET | Freq: Every day | ORAL | 3 refills | Status: DC

## 2022-04-23 MED ORDER — LISINOPRIL 40 MG PO TABS: 40.0000 mg | ORAL_TABLET | Freq: Every day | ORAL | 3 refills | Status: DC

## 2022-04-23 MED ORDER — INSULIN GLARGINE 100 UNIT/ML SOLOSTAR PEN: 15.0000 [IU] | PEN_INJECTOR | Freq: Every day | SUBCUTANEOUS | 2 refills | Status: DC

## 2022-04-23 MED ORDER — CLONIDINE HCL 0.1 MG PO TABS: 0.1000 mg | ORAL_TABLET | Freq: Two times a day (BID) | ORAL | 3 refills | Status: DC

## 2022-04-23 MED ORDER — OZEMPIC (2 MG/DOSE) 8 MG/3ML ~~LOC~~ SOPN: 2.0000 mg | PEN_INJECTOR | SUBCUTANEOUS | 0 refills | Status: DC

## 2022-04-23 MED ORDER — DULOXETINE HCL 30 MG PO CPEP: 30.0000 mg | ORAL_CAPSULE | Freq: Every day | ORAL | 3 refills | Status: DC

## 2022-04-23 NOTE — Assessment & Plan Note (Signed)
BP at goal with amlodipine. Lisinopril, metoprolol, clonidine. BP Readings from Last 3 Encounters:  04/23/22 122/82  12/10/21 (!) 167/74  12/04/21 136/86    Maintain med doses

## 2022-04-23 NOTE — Assessment & Plan Note (Signed)
Uncontrolled DM with metformin '850mg'$  BID, glipizide '5mg'$  daily, and lantus 10units She was unable start ozempic due to drug shortage hgbA1c today at 13%  Advised increase glipizide to '10mg'$  and lantus to 15units Maintain metformin dose. Advised to schedule appt for annual eye exam She agreed to nutrition referral F/up in 74month

## 2022-04-23 NOTE — Assessment & Plan Note (Signed)
Stable with cymbalta Med refill sent

## 2022-04-23 NOTE — Progress Notes (Signed)
Established Patient Visit  Patient: Melissa Bray   DOB: Oct 30, 1960   62 y.o. Female  MRN: [id number] Visit Date: 04/23/2022  Subjective:    Chief Complaint  Patient presents with   Office Visit    HTN/DM Checks BP & BS Daily Pt fasting  No concerns    HPI Type 2 diabetes mellitus with hyperglycemia, without long-term current use of insulin (HCC) Uncontrolled DM with metformin '850mg'$  BID, glipizide '5mg'$  daily, and lantus 10units She was unable start ozempic due to drug shortage hgbA1c today at 13%  Advised increase glipizide to '10mg'$  and lantus to 15units Maintain metformin dose. Advised to schedule appt for annual eye exam She agreed to nutrition referral F/up in 26month Depression, recurrent ([facility] Stable with cymbalta Med refill sent  Essential hypertension BP at goal with amlodipine. Lisinopril, metoprolol, clonidine. BP Readings from Last 3 Encounters:  04/23/22 122/82  12/10/21 (!) 167/74  12/04/21 136/86    Maintain med doses  Reviewed medical, surgical, and social history today  Medications: Outpatient Medications Prior to Visit  Medication Sig   alendronate (FOSAMAX) 70 MG tablet Take 1 tablet (70 mg total) by mouth once a week. Take with a full glass of water on an empty stomach.   anastrozole (ARIMIDEX) 1 MG tablet Take 1 mg by mouth daily.   metFORMIN (GLUCOPHAGE) 850 MG tablet Take 1 tablet (850 mg total) by mouth 2 (two) times daily with a meal.   metoprolol succinate (TOPROL-XL) 100 MG 24 hr tablet Take 1 tablet (100 mg total) by mouth daily. Take with or immediately following a meal.   pravastatin (PRAVACHOL) 20 MG tablet Take 1 tablet (20 mg total) by mouth daily.   [DISCONTINUED] amLODipine (NORVASC) 5 MG tablet Take 1 tablet (5 mg total) by mouth at bedtime.   [DISCONTINUED] cloNIDine (CATAPRES) 0.1 MG tablet Take 1 tablet (0.1 mg total) by mouth 2 (two) times daily.   [DISCONTINUED] glipiZIDE (GLUCOTROL XL) 5 MG 24 hr tablet Take 1  tablet (5 mg total) by mouth daily with breakfast.   [DISCONTINUED] Insulin Pen Needle (PEN NEEDLES) 31G X 6 MM MISC 1 application by Does not apply route at bedtime.   [DISCONTINUED] lisinopril (ZESTRIL) 40 MG tablet Take 1 tablet (40 mg total) by mouth daily.   [DISCONTINUED] hydrOXYzine (VISTARIL) 25 MG capsule Take 2 capsules (50 mg total) by mouth 3 (three) times daily as needed for anxiety. No additional refills without office visit (Patient not taking: Reported on 04/23/2022)   [DISCONTINUED] Semaglutide, 2 MG/DOSE, (OZEMPIC, 2 MG/DOSE,) 8 MG/3ML SOPN Inject 2 mg into the skin once a week. (Patient not taking: Reported on 04/23/2022)   [DISCONTINUED] traMADol (ULTRAM) 50 MG tablet Take 1 tablet (50 mg total) by mouth every 6 (six) hours as needed. (Patient not taking: Reported on 04/23/2022)   No facility-administered medications prior to visit.   Reviewed past medical and social history.   ROS per HPI above      Objective:  BP 122/82 (BP Location: Right Arm, Patient Position: Sitting, Cuff Size: Normal)   Pulse 82   Temp (!) 96.2 F (35.7 C) (Temporal)   Ht '5\' 5"'$  (1.651 m)   Wt 266 lb 3.2 oz (120.7 kg)   SpO2 96%   BMI 44.30 kg/m      Physical Exam  Results for orders placed or performed in visit on 04/23/22  Microalbumin / creatinine urine ratio  Result Value  Ref Range   Microalb, Ur 12.9 (H) 0.0 - 1.9 mg/dL   Creatinine,U 96.4 mg/dL   Microalb Creat Ratio 13.4 0.0 - 30.0 mg/g  Lipid panel  Result Value Ref Range   Cholesterol 115 0 - 200 mg/dL   Triglycerides 75.0 0.0 - 149.0 mg/dL   HDL 50.40 >39.00 mg/dL   VLDL 15.0 0.0 - 40.0 mg/dL   LDL Cholesterol 50 0 - 99 mg/dL   Total CHOL/HDL Ratio 2    NonHDL 64.77   POCT glycosylated hemoglobin (Hb A1C)  Result Value Ref Range   Hemoglobin A1C 13.0 (A) 4.0 - 5.6 %      Assessment & Plan:    Problem List Items Addressed This Visit       Cardiovascular and Mediastinum   Essential hypertension - Primary    BP at  goal with amlodipine. Lisinopril, metoprolol, clonidine. BP Readings from Last 3 Encounters:  04/23/22 122/82  12/10/21 (!) 167/74  12/04/21 136/86    Maintain med doses      Relevant Medications   amLODipine (NORVASC) 5 MG tablet   cloNIDine (CATAPRES) 0.1 MG tablet   lisinopril (ZESTRIL) 40 MG tablet     Endocrine   Type 2 diabetes mellitus with hyperglycemia, without long-term current use of insulin (HCC)    Uncontrolled DM with metformin '850mg'$  BID, glipizide '5mg'$  daily, and lantus 10units She was unable start ozempic due to drug shortage hgbA1c today at 13%  Advised increase glipizide to '10mg'$  and lantus to 15units Maintain metformin dose. Advised to schedule appt for annual eye exam She agreed to nutrition referral F/up in 17month     Relevant Medications   glipiZIDE (GLUCOTROL XL) 10 MG 24 hr tablet   insulin glargine (LANTUS) 100 UNIT/ML Solostar Pen   Insulin Pen Needle (PEN NEEDLES) 31G X 6 MM MISC   lisinopril (ZESTRIL) 40 MG tablet   Other Relevant Orders   Microalbumin / creatinine urine ratio (Completed)   POCT glycosylated hemoglobin (Hb A1C) (Completed)   Referral to Nutrition and Diabetes Services     Other   Depression, recurrent (HCC)    Stable with cymbalta Med refill sent      Relevant Medications   DULoxetine (CYMBALTA) 30 MG capsule   Morbid obesity (HCC)   Relevant Medications   glipiZIDE (GLUCOTROL XL) 10 MG 24 hr tablet   insulin glargine (LANTUS) 100 UNIT/ML Solostar Pen   Other Visit Diagnoses     Hyperlipidemia associated with type 2 diabetes mellitus (HCC)       Relevant Medications   glipiZIDE (GLUCOTROL XL) 10 MG 24 hr tablet   insulin glargine (LANTUS) 100 UNIT/ML Solostar Pen   amLODipine (NORVASC) 5 MG tablet   cloNIDine (CATAPRES) 0.1 MG tablet   lisinopril (ZESTRIL) 40 MG tablet   Other Relevant Orders   Lipid panel (Completed)      Return in about 3 months (around 07/23/2022) for DM, HTN, hyperlipidemia (fasting).      CWilfred Lacy NP

## 2022-04-23 NOTE — Patient Instructions (Addendum)
Go to lab Increase lantus at 15units Increase glipizide to '10mg'$  Maintain metformin dose You will be contacted to schedule appt with nutritionist.  Diabetes Mellitus and Nutrition, Adult When you have diabetes, or diabetes mellitus, it is very important to have healthy eating habits because your blood sugar (glucose) levels are greatly affected by what you eat and drink. Eating healthy foods in the right amounts, at about the same times every day, can help you: Manage your blood glucose. Lower your risk of heart disease. Improve your blood pressure. Reach or maintain a healthy weight. What can affect my meal plan? Every person with diabetes is different, and each person has different needs for a meal plan. Your health care provider may recommend that you work with a dietitian to make a meal plan that is best for you. Your meal plan may vary depending on factors such as: The calories you need. The medicines you take. Your weight. Your blood glucose, blood pressure, and cholesterol levels. Your activity level. Other health conditions you have, such as heart or kidney disease. How do carbohydrates affect me? Carbohydrates, also called carbs, affect your blood glucose level more than any other type of food. Eating carbs raises the amount of glucose in your blood. It is important to know how many carbs you can safely have in each meal. This is different for every person. Your dietitian can help you calculate how many carbs you should have at each meal and for each snack. How does alcohol affect me? Alcohol can cause a decrease in blood glucose (hypoglycemia), especially if you use insulin or take certain diabetes medicines by mouth. Hypoglycemia can be a life-threatening condition. Symptoms of hypoglycemia, such as sleepiness, dizziness, and confusion, are similar to symptoms of having too much alcohol. Do not drink alcohol if: Your health care provider tells you not to drink. You are pregnant,  may be pregnant, or are planning to become pregnant. If you drink alcohol: Limit how much you have to: 0-1 drink a day for women. 0-2 drinks a day for men. Know how much alcohol is in your drink. In the [place] one drink equals one 12 oz bottle of beer (355 mL), one 5 oz glass of wine (148 mL), or one 1 oz glass of hard liquor (44 mL). Keep yourself hydrated with water, diet soda, or unsweetened iced tea. Keep in mind that regular soda, juice, and other mixers may contain a lot of sugar and must be counted as carbs. What are tips for following this plan?  Reading food labels Start by checking the serving size on the Nutrition Facts label of packaged foods and drinks. The number of calories and the amount of carbs, fats, and other nutrients listed on the label are based on one serving of the item. Many items contain more than one serving per package. Check the total grams (g) of carbs in one serving. Check the number of grams of saturated fats and trans fats in one serving. Choose foods that have a low amount or none of these fats. Check the number of milligrams (mg) of salt (sodium) in one serving. Most people should limit total sodium intake to less than 2,300 mg per day. Always check the nutrition information of foods labeled as "low-fat" or "nonfat." These foods may be higher in added sugar or refined carbs and should be avoided. Talk to your dietitian to identify your daily goals for nutrients listed on the label. Shopping Avoid buying canned, pre-made, or processed foods. These  foods tend to be high in fat, sodium, and added sugar. Shop around the outside edge of the grocery store. This is where you will most often find fresh fruits and vegetables, bulk grains, fresh meats, and fresh dairy products. Cooking Use low-heat cooking methods, such as baking, instead of high-heat cooking methods, such as deep frying. Cook using healthy oils, such as olive, canola, or sunflower oil. Avoid cooking  with butter, cream, or high-fat meats. Meal planning Eat meals and snacks regularly, preferably at the same times every day. Avoid going long periods of time without eating. Eat foods that are high in fiber, such as fresh fruits, vegetables, beans, and whole grains. Eat 4-6 oz (112-168 g) of lean protein each day, such as lean meat, chicken, fish, eggs, or tofu. One ounce (oz) (28 g) of lean protein is equal to: 1 oz (28 g) of meat, chicken, or fish. 1 egg.  cup (62 g) of tofu. Eat some foods each day that contain healthy fats, such as avocado, nuts, seeds, and fish. What foods should I eat? Fruits Berries. Apples. Oranges. Peaches. Apricots. Plums. Grapes. Mangoes. Papayas. Pomegranates. Kiwi. Cherries. Vegetables Leafy greens, including lettuce, spinach, kale, chard, collard greens, mustard greens, and cabbage. Beets. Cauliflower. Broccoli. Carrots. Green beans. Tomatoes. Peppers. Onions. Cucumbers. Brussels sprouts. Grains Whole grains, such as whole-wheat or whole-grain bread, crackers, tortillas, cereal, and pasta. Unsweetened oatmeal. Quinoa. Brown or wild rice. Meats and other proteins Seafood. Poultry without skin. Lean cuts of poultry and beef. Tofu. Nuts. Seeds. Dairy Low-fat or fat-free dairy products such as milk, yogurt, and cheese. The items listed above may not be a complete list of foods and beverages you can eat and drink. Contact a dietitian for more information. What foods should I avoid? Fruits Fruits canned with syrup. Vegetables Canned vegetables. Frozen vegetables with butter or cream sauce. Grains Refined white flour and flour products such as bread, pasta, snack foods, and cereals. Avoid all processed foods. Meats and other proteins Fatty cuts of meat. Poultry with skin. Breaded or fried meats. Processed meat. Avoid saturated fats. Dairy Full-fat yogurt, cheese, or milk. Beverages Sweetened drinks, such as soda or iced tea. The items listed above may not be  a complete list of foods and beverages you should avoid. Contact a dietitian for more information. Questions to ask a health care provider Do I need to meet with a certified diabetes care and education specialist? Do I need to meet with a dietitian? What number can I call if I have questions? When are the best times to check my blood glucose? Where to find more information: American Diabetes Association: diabetes.org Academy of Nutrition and Dietetics: eatright.[employer] of Diabetes and Digestive and Kidney Diseases: [employer]is Association of Diabetes Care & Education Specialists: diabeteseducator.org Summary It is important to have healthy eating habits because your blood sugar (glucose) levels are greatly affected by what you eat and drink. It is important to use alcohol carefully. A healthy meal plan will help you manage your blood glucose and lower your risk of heart disease. Your health care provider may recommend that you work with a dietitian to make a meal plan that is best for you. This information is not intended to replace advice given to you by your health care provider. Make sure you discuss any questions you have with your health care provider. Document Revised: 10/20/2019 Document Reviewed: 10/20/2019 Elsevier Patient Education  [place].

## 2022-05-08 DIAGNOSIS — Z853 Personal history of malignant neoplasm of breast: Secondary | ICD-10-CM | POA: Diagnosis not present

## 2022-05-08 DIAGNOSIS — C50919 Malignant neoplasm of unspecified site of unspecified female breast: Secondary | ICD-10-CM | POA: Diagnosis not present

## 2022-05-08 DIAGNOSIS — Z79811 Long term (current) use of aromatase inhibitors: Secondary | ICD-10-CM | POA: Diagnosis not present

## 2022-05-08 DIAGNOSIS — Z08 Encounter for follow-up examination after completed treatment for malignant neoplasm: Secondary | ICD-10-CM | POA: Diagnosis not present

## 2022-05-08 DIAGNOSIS — R7302 Impaired glucose tolerance (oral): Secondary | ICD-10-CM | POA: Diagnosis not present

## 2022-05-08 DIAGNOSIS — Z9221 Personal history of antineoplastic chemotherapy: Secondary | ICD-10-CM | POA: Diagnosis not present

## 2022-05-08 DIAGNOSIS — Z9012 Acquired absence of left breast and nipple: Secondary | ICD-10-CM | POA: Diagnosis not present

## 2022-05-14 ENCOUNTER — Other Ambulatory Visit: Payer: Self-pay

## 2022-05-14 ENCOUNTER — Telehealth: Payer: Self-pay | Admitting: Nurse Practitioner

## 2022-05-14 DIAGNOSIS — E1165 Type 2 diabetes mellitus with hyperglycemia: Secondary | ICD-10-CM

## 2022-05-14 MED ORDER — METFORMIN HCL 850 MG PO TABS: 850.0000 mg | ORAL_TABLET | Freq: Two times a day (BID) | ORAL | 0 refills | Status: DC

## 2022-05-14 NOTE — Telephone Encounter (Signed)
Prescription Request  05/14/2022  Is this a "Controlled Substance" medicine? No  LOV: 04/23/2022  What is the name of the medication or equipment? metFORMIN   Have you contacted your pharmacy to request a refill? No   Which pharmacy would you like this sent to?  [place] [address] [place] [address] Phone: [phone] Fax: [phone]      Patient notified that their request is being sent to the clinical staff for review and that they should receive a response within 2 business days.   Please advise at Mobile [phone] (mobile)

## 2022-05-14 NOTE — Telephone Encounter (Signed)
Medication refill sent to preferred pharmacy by Lynnea Maizes, [place].

## 2022-05-27 ENCOUNTER — Encounter: Payer: Self-pay | Admitting: Skilled Nursing Facility1

## 2022-05-27 ENCOUNTER — Encounter: Payer: BC Managed Care – PPO | Attending: Nurse Practitioner | Admitting: Skilled Nursing Facility1

## 2022-05-27 DIAGNOSIS — E1165 Type 2 diabetes mellitus with hyperglycemia: Secondary | ICD-10-CM | POA: Diagnosis not present

## 2022-05-27 NOTE — Progress Notes (Signed)
Diabetes Self-Management Education  Visit Type: First/Initial   05/27/2022  [name], identified by name and date of birth, is a 62 y.o. female with a diagnosis of Diabetes: Type 2.   ASSESSMENT   DM meds: Glipizide 10 mg Metformin 850 BID: taken sometimes without food (educated pt on this topic) Lantus 15 units: not taken every day stating she forgets to take it   Other Dx: Anxiety HTN Hyperlipidemia Breast cancer Heart murmur   Pt states she has had DM for a few years. Pt states she has a support system at home.  Pts A1C has been historically been out of control. Pt states she checks her blood sugar a couple times a week only when feeling something stating she sees maybe 150-170.  Pt states she stopped eating chips.  Pt states she does a walking youtube video 3 days a week fr 30 minutes.  Pt states she works third shift. Pt states she does not sleep good stating she needs to talk to her doctor.    Goals: Brush your teeth 2 times a day Find a time to take your Lantus at the same time daily Check your blood sugar once a day changing up between fasting and 2 hours after eating  Increase your activity to 6 days a week  Create balanced melas Limit grapes to about 17 per event    Diabetes Self-Management Education - 05/27/22 0848       Visit Information   Visit Type First/Initial      Initial Visit   Diabetes Type Type 2    Are you currently following a meal plan? No    Are you taking your medications as prescribed? No      Health Coping   How would you rate your overall health? Fair      Psychosocial Assessment   Patient Belief/Attitude about Diabetes Motivated to manage diabetes    What is the hardest part about your diabetes right now, causing you the most concern, or is the most worrisome to you about your diabetes?   Making healty food and beverage choices;Taking/obtaining medications    Self-care barriers None    Self-management support  Friends;Family    Patient Concerns Nutrition/Meal planning;Healthy Lifestyle;Weight Control    Special Needs None    Preferred Learning Style Visual    Learning Readiness Contemplating    How often do you need to have someone help you when you read instructions, pamphlets, or other written materials from your doctor or pharmacy? 1 - Never      Pre-Education Assessment   Patient understands the diabetes disease and treatment process. Needs Instruction    Patient understands incorporating nutritional management into lifestyle. Needs Instruction    Patient undertands incorporating physical activity into lifestyle. Needs Instruction    Patient understands using medications safely. Needs Instruction    Patient understands monitoring blood glucose, interpreting and using results Needs Instruction    Patient understands prevention, detection, and treatment of acute complications. Needs Instruction    Patient understands prevention, detection, and treatment of chronic complications. Needs Instruction    Patient understands how to develop strategies to address psychosocial issues. Needs Instruction    Patient understands how to develop strategies to promote health/change behavior. Needs Instruction      Complications   Last HgB A1C per patient/outside source 13 %    How often do you check your blood sugar? 3-4 times / week    Have you had a dilated eye exam  in the past 12 months? Yes    Have you had a dental exam in the past 12 months? No   upcoming   Are you checking your feet? Yes    How many days per week are you checking your feet? 7      Dietary Intake   Breakfast 2 egg omolet + cheese + somtimes spinach    Snack (morning) popcorn    Lunch chicken or pepper steak + corn    Snack (afternoon) tangerine    Dinner salad + chicken + tomato + onion + cucumber    Snack (evening) sweets    Beverage(s) sprite zero, water + flavoring      Activity / Exercise   Activity / Exercise Type Light  (walking / raking leaves)    How many days per week do you exercise? 3    How many minutes per day do you exercise? 30    Total minutes per week of exercise 90      Patient Education   Previous Diabetes Education No    Disease Pathophysiology Definition of diabetes, type 1 and 2, and the diagnosis of diabetes;Factors that contribute to the development of diabetes    Healthy Eating Plate Method;Carbohydrate counting;Information on hints to eating out and maintain blood glucose control.;Meal options for control of blood glucose level and chronic complications.;Role of diet in the treatment of diabetes and the relationship between the three main macronutrients and blood glucose level;Food label reading, portion sizes and measuring food.    Being Active Role of exercise on diabetes management, blood pressure control and cardiac health.;Helped patient identify appropriate exercises in relation to his/her diabetes, diabetes complications and other health issue.    Medications Taught/reviewed insulin/injectables, injection, site rotation, insulin/injectables storage and needle disposal.;Reviewed patients medication for diabetes, action, purpose, timing of dose and side effects.    Monitoring Taught/evaluated SMBG meter.;Daily foot exams;Yearly dilated eye exam;Taught/discussed recording of test results and interpretation of SMBG.    Acute complications Taught prevention, symptoms, and  treatment of hypoglycemia - the 15 rule.;Discussed and identified patients' prevention, symptoms, and treatment of hyperglycemia.    Chronic complications Dental care;Retinopathy and reason for yearly dilated eye exams;Nephropathy, what it is, prevention of, the use of ACE, ARB's and early detection of through urine microalbumia.;Lipid levels, blood glucose control and heart disease;Assessed and discussed foot care and prevention of foot problems    Diabetes Stress and Support Role of stress on diabetes;Identified and addressed  patients feelings and concerns about diabetes      Individualized Goals (developed by patient)   Nutrition Follow meal plan discussed;General guidelines for healthy choices and portions discussed    Physical Activity Exercise 5-7 days per week;30 minutes per day    Medications take my medication as prescribed    Problem Solving Eating Pattern;Medication consistency    Reducing Risk do foot checks daily;treat hypoglycemia with 15 grams of carbs if blood glucose less than '70mg'$ /dL      Post-Education Assessment   Patient understands the diabetes disease and treatment process. Demonstrates understanding / competency    Patient understands incorporating nutritional management into lifestyle. Demonstrates understanding / competency    Patient undertands incorporating physical activity into lifestyle. Demonstrates understanding / competency    Patient understands using medications safely. Demonstrates understanding / competency    Patient understands monitoring blood glucose, interpreting and using results Demonstrates understanding / competency    Patient understands prevention, detection, and treatment of acute complications. Demonstrates understanding / competency  Patient understands prevention, detection, and treatment of chronic complications. Demonstrates understanding / competency    Patient understands how to develop strategies to address psychosocial issues. Demonstrates understanding / competency    Patient understands how to develop strategies to promote health/change behavior. Demonstrates understanding / competency      Outcomes   Expected Outcomes Demonstrated interest in learning. Expect positive outcomes    Future DMSE 4-6 wks    Program Status Completed             Individualized Plan for Diabetes Self-Management Training:   Learning Objective:  Patient will have a greater understanding of diabetes self-management. Patient education plan is to attend individual and/or  group sessions per assessed needs and concerns.    Expected Outcomes:  Demonstrated interest in learning. Expect positive outcomes  Education material provided: ADA - How to Thrive: A Guide for Your Journey with Diabetes, A1C conversion sheet, My Plate, Snack sheet, and Carbohydrate counting sheet  If problems or questions, patient to contact team via:  Phone and Email  Future DSME appointment: 4-6 wks

## 2022-07-08 ENCOUNTER — Ambulatory Visit: Payer: BC Managed Care – PPO | Admitting: Skilled Nursing Facility1

## 2022-07-16 ENCOUNTER — Other Ambulatory Visit: Payer: Self-pay | Admitting: Nurse Practitioner

## 2022-07-16 DIAGNOSIS — I10 Essential (primary) hypertension: Secondary | ICD-10-CM

## 2022-07-19 ENCOUNTER — Other Ambulatory Visit: Payer: Self-pay | Admitting: Nurse Practitioner

## 2022-07-19 DIAGNOSIS — I10 Essential (primary) hypertension: Secondary | ICD-10-CM

## 2022-08-07 ENCOUNTER — Other Ambulatory Visit (HOSPITAL_BASED_OUTPATIENT_CLINIC_OR_DEPARTMENT_OTHER): Payer: Self-pay

## 2022-08-18 ENCOUNTER — Other Ambulatory Visit: Payer: Self-pay | Admitting: Nurse Practitioner

## 2022-08-18 DIAGNOSIS — E1165 Type 2 diabetes mellitus with hyperglycemia: Secondary | ICD-10-CM

## 2022-08-19 ENCOUNTER — Other Ambulatory Visit: Payer: Self-pay

## 2022-08-19 ENCOUNTER — Telehealth: Payer: Self-pay | Admitting: Nurse Practitioner

## 2022-08-19 DIAGNOSIS — I10 Essential (primary) hypertension: Secondary | ICD-10-CM

## 2022-08-19 MED ORDER — METOPROLOL SUCCINATE ER 100 MG PO TB24: 100.0000 mg | ORAL_TABLET | Freq: Every day | ORAL | 0 refills | Status: DC

## 2022-08-19 NOTE — Telephone Encounter (Signed)
Pt is having a hard time getting her metoprolol succinate (TOPROL-XL) 100 MG 24 hr tablet [[id number]] refilled. Please call pharmacy to clarify. [facility] [address] [place] [phone]

## 2022-08-19 NOTE — Telephone Encounter (Signed)
Called patient and made her away that we denied the refill because an appointment was needed.  She made an appointment for 5/23 and 30 days was sent in

## 2022-08-22 ENCOUNTER — Telehealth: Payer: Self-pay | Admitting: Nurse Practitioner

## 2022-08-22 ENCOUNTER — Ambulatory Visit: Payer: Self-pay | Admitting: Nurse Practitioner

## 2022-08-22 NOTE — Telephone Encounter (Signed)
Pt NS on 5/23 no reason available letter sent 

## 2022-08-23 NOTE — Telephone Encounter (Signed)
Correction - pt came into office 8:25a, pt insurance not active and did not want to self pay so she left.  No show history: 08/31/19 no show 12/09/19 same day cancel/car trouble 08/23/20 no show 12/15/20 came in and pt said she didn't need cpe/pap and left 09/11/21 no show 08/22/22 no insurance/cannot self pay/pt left without being seen   Please advise how you would like me to proceed.

## 2022-08-27 NOTE — Telephone Encounter (Signed)
Final warning sent via mail, mychart and text

## 2022-09-27 ENCOUNTER — Other Ambulatory Visit: Payer: Self-pay | Admitting: Nurse Practitioner

## 2022-09-27 DIAGNOSIS — I10 Essential (primary) hypertension: Secondary | ICD-10-CM

## 2022-10-08 DIAGNOSIS — R1032 Left lower quadrant pain: Secondary | ICD-10-CM | POA: Diagnosis not present

## 2022-10-08 DIAGNOSIS — K760 Fatty (change of) liver, not elsewhere classified: Secondary | ICD-10-CM | POA: Diagnosis not present

## 2022-10-08 DIAGNOSIS — R112 Nausea with vomiting, unspecified: Secondary | ICD-10-CM | POA: Diagnosis not present

## 2022-10-08 DIAGNOSIS — N202 Calculus of kidney with calculus of ureter: Secondary | ICD-10-CM | POA: Diagnosis not present

## 2022-10-08 DIAGNOSIS — N200 Calculus of kidney: Secondary | ICD-10-CM | POA: Diagnosis not present

## 2022-10-08 DIAGNOSIS — N132 Hydronephrosis with renal and ureteral calculous obstruction: Secondary | ICD-10-CM | POA: Diagnosis not present

## 2022-10-28 DIAGNOSIS — Z5321 Procedure and treatment not carried out due to patient leaving prior to being seen by health care provider: Secondary | ICD-10-CM | POA: Diagnosis not present

## 2022-10-28 DIAGNOSIS — Z0489 Encounter for examination and observation for other specified reasons: Secondary | ICD-10-CM | POA: Diagnosis not present

## 2022-10-29 ENCOUNTER — Encounter: Payer: Self-pay | Admitting: Nurse Practitioner

## 2022-10-29 ENCOUNTER — Ambulatory Visit: Payer: BC Managed Care – PPO | Admitting: Nurse Practitioner

## 2022-10-29 VITALS — BP 150/100 | HR 98 | Temp 98.0°F | Resp 16 | Ht 65.0 in | Wt 265.2 lb

## 2022-10-29 DIAGNOSIS — I10 Essential (primary) hypertension: Secondary | ICD-10-CM | POA: Diagnosis not present

## 2022-10-29 DIAGNOSIS — M810 Age-related osteoporosis without current pathological fracture: Secondary | ICD-10-CM | POA: Diagnosis not present

## 2022-10-29 DIAGNOSIS — C50919 Malignant neoplasm of unspecified site of unspecified female breast: Secondary | ICD-10-CM

## 2022-10-29 DIAGNOSIS — F339 Major depressive disorder, recurrent, unspecified: Secondary | ICD-10-CM

## 2022-10-29 DIAGNOSIS — K219 Gastro-esophageal reflux disease without esophagitis: Secondary | ICD-10-CM

## 2022-10-29 DIAGNOSIS — Z7985 Long-term (current) use of injectable non-insulin antidiabetic drugs: Secondary | ICD-10-CM

## 2022-10-29 DIAGNOSIS — Z7984 Long term (current) use of oral hypoglycemic drugs: Secondary | ICD-10-CM

## 2022-10-29 DIAGNOSIS — Z794 Long term (current) use of insulin: Secondary | ICD-10-CM | POA: Diagnosis not present

## 2022-10-29 DIAGNOSIS — N200 Calculus of kidney: Secondary | ICD-10-CM | POA: Insufficient documentation

## 2022-10-29 DIAGNOSIS — E1165 Type 2 diabetes mellitus with hyperglycemia: Secondary | ICD-10-CM | POA: Diagnosis not present

## 2022-10-29 DIAGNOSIS — G620 Drug-induced polyneuropathy: Secondary | ICD-10-CM

## 2022-10-29 DIAGNOSIS — D126 Benign neoplasm of colon, unspecified: Secondary | ICD-10-CM | POA: Insufficient documentation

## 2022-10-29 DIAGNOSIS — E119 Type 2 diabetes mellitus without complications: Secondary | ICD-10-CM

## 2022-10-29 DIAGNOSIS — T451X5A Adverse effect of antineoplastic and immunosuppressive drugs, initial encounter: Secondary | ICD-10-CM

## 2022-10-29 LAB — HEMOGLOBIN A1C: Hgb A1c MFr Bld: 13.1 % — ABNORMAL HIGH (ref 4.6–6.5)

## 2022-10-29 LAB — TSH: TSH: 0.95 u[IU]/mL (ref 0.35–5.50)

## 2022-10-29 LAB — VITAMIN D 25 HYDROXY (VIT D DEFICIENCY, FRACTURES): VITD: 18.8 ng/mL — ABNORMAL LOW (ref 30.00–100.00)

## 2022-10-29 MED ORDER — DULOXETINE HCL 30 MG PO CPEP
30.0000 mg | ORAL_CAPSULE | Freq: Every day | ORAL | 3 refills | Status: DC
Start: 2022-10-29 — End: 2023-01-29

## 2022-10-29 MED ORDER — FAMOTIDINE 20 MG PO TABS: 20.0000 mg | ORAL_TABLET | Freq: Two times a day (BID) | ORAL | 0 refills | Status: DC

## 2022-10-29 MED ORDER — AMLODIPINE BESYLATE 5 MG PO TABS
5.0000 mg | ORAL_TABLET | Freq: Every day | ORAL | 3 refills | Status: DC
Start: 2022-10-29 — End: 2023-01-29

## 2022-10-29 MED ORDER — METOPROLOL SUCCINATE ER 100 MG PO TB24
100.0000 mg | ORAL_TABLET | Freq: Every day | ORAL | 3 refills | Status: DC
Start: 2022-10-29 — End: 2023-01-29

## 2022-10-29 NOTE — Assessment & Plan Note (Signed)
Maintain med dose Check Vit. D

## 2022-10-29 NOTE — Patient Instructions (Addendum)
Retinal Eye exam 10/17@ 10am Go to lab

## 2022-10-29 NOTE — Assessment & Plan Note (Signed)
Repeat colonoscopy 2030

## 2022-10-29 NOTE — Progress Notes (Signed)
Established Patient Visit  Patient: Melissa Bray   DOB: [birth year]   62 y.o. Female  MRN: [id number] Visit Date: 10/29/2022  Subjective:    Chief Complaint  Patient presents with   Medical Management of Chronic Issues    Fasting    HPI Breast cancer, stage 3 (HCC) Current use of anastrozole Under the care of [facility] oncology, Last OV 05/2022 Per oncology: Unable to repeat PET scan due to insurance coverage S/p right mastectomy with axilary lymph node dissection, completed chemotherapy Bone density 2022: osteopenia, current use of fosamax, vit. D and calcium F/up annually  Age-related osteoporosis without current pathological fracture Maintain med dose Check Vit. D  Depression, recurrent (HCC) Stable with cymbalta Denies need for counseling Med refill sent  Essential hypertension BP at not goal due to lack of amlodipine and metoprolol x 53month. Currently takes only clonidine and lisisnopril BP Readings from Last 3 Encounters:  10/29/22 (!) 150/100  04/23/22 122/82  12/10/21 (!) 167/74    Med refills sent Advised about importance of med compliance F/up in 3months  GERD (gastroesophageal reflux disease) Nausea and heartburn with food intake Minimal improvement with tums and elimination of trigger food. No epigastric pain, no change in BM.  Possibly due to fosamax vs metformin? Try famotidine 20mg  BID x 2weeks If no improvement, Ref to GI and change fosamax to prolia?  Type 2 diabetes mellitus with hyperglycemia, without long-term current use of insulin (HCC) Uncontrolled Current use of metformin 850mg  BID, lantus 15units, and glipizide Scheduled DIABETES eye exam with mobile clinic 01/16/23 Normal foot exam today Normal UACr BP not at goal Current use of statin and ACE-I  Repeat hgbA1c and lipid panel Maintain med doses  Adenomatous colon polyp Repeat colonoscopy 2030  BP Readings from Last 3 Encounters:  10/29/22 (!) 150/100   04/23/22 122/82  12/10/21 (!) 167/74    Reviewed medical, surgical, and social history today  Medications: Outpatient Medications Prior to Visit  Medication Sig   alendronate (FOSAMAX) 70 MG tablet Take 1 tablet (70 mg total) by mouth once a week. Take with a full glass of water on an empty stomach.   anastrozole (ARIMIDEX) 1 MG tablet Take 1 mg by mouth daily.   cloNIDine (CATAPRES) 0.1 MG tablet Take 1 tablet (0.1 mg total) by mouth 2 (two) times daily.   glipiZIDE (GLUCOTROL XL) 10 MG 24 hr tablet Take 1 tablet (10 mg total) by mouth daily with breakfast.   insulin glargine (LANTUS) 100 UNIT/ML Solostar Pen Inject 15 Units into the skin daily.   Insulin Pen Needle (PEN NEEDLES) 31G X 6 MM MISC 1 application  by Does not apply route at bedtime.   lisinopril (ZESTRIL) 40 MG tablet Take 1 tablet (40 mg total) by mouth daily.   metFORMIN (GLUCOPHAGE) 850 MG tablet TAKE 1 TABLET BY MOUTH TWICE DAILY WITH A MEAL   pravastatin (PRAVACHOL) 20 MG tablet Take 1 tablet (20 mg total) by mouth daily.   [DISCONTINUED] amLODipine (NORVASC) 5 MG tablet Take 1 tablet (5 mg total) by mouth at bedtime.   [DISCONTINUED] DULoxetine (CYMBALTA) 30 MG capsule Take 1 capsule (30 mg total) by mouth daily.   [DISCONTINUED] metoprolol succinate (TOPROL-XL) 100 MG 24 hr tablet Take 1 tablet (100 mg total) by mouth daily.   No facility-administered medications prior to visit.   Reviewed past medical and social history.   ROS per HPI above  Objective:  BP (!) 150/100   Pulse 98   Temp 98 F (36.7 C) (Temporal)   Resp 16   Ht 5\' 5"  (1.651 m)   Wt 265 lb 3.2 oz (120.3 kg)   SpO2 98%   BMI 44.13 kg/m      Physical Exam Cardiovascular:     Rate and Rhythm: Normal rate and regular rhythm.     Pulses: Normal pulses.     Heart sounds: Normal heart sounds.  Pulmonary:     Effort: Pulmonary effort is normal.     Breath sounds: Normal breath sounds.  Neurological:     Mental Status: She is alert  and oriented to person, place, and time.     No results found for any visits on 10/29/22.    Assessment & Plan:    Problem List Items Addressed This Visit       Cardiovascular and Mediastinum   Essential hypertension    BP at not goal due to lack of amlodipine and metoprolol x 29month. Currently takes only clonidine and lisisnopril BP Readings from Last 3 Encounters:  10/29/22 (!) 150/100  04/23/22 122/82  12/10/21 (!) 167/74    Med refills sent Advised about importance of med compliance F/up in 3months      Relevant Medications   amLODipine (NORVASC) 5 MG tablet   metoprolol succinate (TOPROL-XL) 100 MG 24 hr tablet   Other Relevant Orders   TSH     Digestive   GERD (gastroesophageal reflux disease)    Nausea and heartburn with food intake Minimal improvement with tums and elimination of trigger food. No epigastric pain, no change in BM.  Possibly due to fosamax vs metformin? Try famotidine 20mg  BID x 2weeks If no improvement, Ref to GI and change fosamax to prolia?      Relevant Medications   famotidine (PEPCID) 20 MG tablet     Endocrine   Type 2 diabetes mellitus with hyperglycemia, without long-term current use of insulin (HCC)    Uncontrolled Current use of metformin 850mg  BID, lantus 15units, and glipizide Scheduled DIABETES eye exam with mobile clinic 01/16/23 Normal foot exam today Normal UACr BP not at goal Current use of statin and ACE-I  Repeat hgbA1c and lipid panel Maintain med doses      Relevant Orders   Hemoglobin A1c     Nervous and Auditory   Neuropathy due to chemotherapeutic drug (HCC) - Primary     Musculoskeletal and Integument   Age-related osteoporosis without current pathological fracture    Maintain med dose Check Vit. D      Relevant Orders   VITAMIN D 25 Hydroxy (Vit-D Deficiency, Fractures)     Other   Breast cancer, stage 3 (HCC)    Current use of anastrozole Under the care of [facility] oncology, Last OV  05/2022 Per oncology: Unable to repeat PET scan due to insurance coverage S/p right mastectomy with axilary lymph node dissection, completed chemotherapy Bone density 2022: osteopenia, current use of fosamax, vit. D and calcium F/up annually      Depression, recurrent (HCC)    Stable with cymbalta Denies need for counseling Med refill sent      Relevant Medications   DULoxetine (CYMBALTA) 30 MG capsule   Return in about 3 months (around 01/29/2023) for HTN, DM, hyperlipidemia (fasting).     Alysia Penna, NP

## 2022-10-29 NOTE — Assessment & Plan Note (Signed)
Nausea and heartburn with food intake Minimal improvement with tums and elimination of trigger food. No epigastric pain, no change in BM.  Possibly due to fosamax vs metformin? Try famotidine 20mg  BID x 2weeks If no improvement, Ref to GI and change fosamax to prolia?

## 2022-10-29 NOTE — Assessment & Plan Note (Signed)
Uncontrolled Current use of metformin 850mg  BID, lantus 15units, and glipizide Scheduled DIABETES eye exam with mobile clinic 01/16/23 Normal foot exam today Normal [facility] BP not at goal Current use of statin and ACE-I  Repeat hgbA1c and lipid panel Maintain med doses

## 2022-10-29 NOTE — Assessment & Plan Note (Addendum)
BP at not goal due to lack of amlodipine and metoprolol x 75month. Currently takes only clonidine and lisisnopril BP Readings from Last 3 Encounters:  10/29/22 (!) 150/100  04/23/22 122/82  12/10/21 (!) 167/74    Med refills sent Advised about importance of med compliance F/up in 3months

## 2022-10-29 NOTE — Assessment & Plan Note (Signed)
Stable with cymbalta Denies need for counseling Med refill sent

## 2022-10-29 NOTE — Assessment & Plan Note (Signed)
Current use of anastrozole Under the care of [facility] oncology, Last OV 05/2022 Per oncology: Unable to repeat PET scan due to insurance coverage S/p right mastectomy with axilary lymph node dissection, completed chemotherapy Bone density 2022: osteopenia, current use of fosamax, vit. D and calcium F/up annually

## 2022-10-31 MED ORDER — FREESTYLE LIBRE READER DEVI
1.0000 [IU] | Freq: Once | 0 refills | Status: AC
Start: 2022-10-31 — End: 2022-10-31

## 2022-10-31 MED ORDER — INSULIN GLARGINE 100 UNIT/ML SOLOSTAR PEN
20.0000 [IU] | PEN_INJECTOR | Freq: Every day | SUBCUTANEOUS | 3 refills | Status: DC
Start: 2022-10-31 — End: 2023-01-29

## 2022-10-31 MED ORDER — FREESTYLE LIBRE 14 DAY SENSOR MISC: 1.0000 [IU] | 11 refills | Status: DC

## 2022-10-31 MED ORDER — OZEMPIC (0.25 OR 0.5 MG/DOSE) 2 MG/1.5ML ~~LOC~~ SOPN
0.2500 mg | PEN_INJECTOR | SUBCUTANEOUS | 2 refills | Status: DC
Start: 2022-10-31 — End: 2022-11-07

## 2022-10-31 NOTE — Addendum Note (Signed)
Addended by: Michaela Corner on: 10/31/2022 04:13 PM   Modules accepted: Orders

## 2022-11-01 ENCOUNTER — Other Ambulatory Visit: Payer: Self-pay | Admitting: Nurse Practitioner

## 2022-11-01 DIAGNOSIS — E1165 Type 2 diabetes mellitus with hyperglycemia: Secondary | ICD-10-CM

## 2022-11-07 ENCOUNTER — Other Ambulatory Visit: Payer: Self-pay

## 2022-11-07 DIAGNOSIS — Z7985 Long-term (current) use of injectable non-insulin antidiabetic drugs: Secondary | ICD-10-CM

## 2022-11-07 DIAGNOSIS — Z7984 Long term (current) use of oral hypoglycemic drugs: Secondary | ICD-10-CM

## 2022-11-07 DIAGNOSIS — E1165 Type 2 diabetes mellitus with hyperglycemia: Secondary | ICD-10-CM

## 2022-11-07 MED ORDER — OZEMPIC (0.25 OR 0.5 MG/DOSE) 2 MG/1.5ML ~~LOC~~ SOPN: 0.2500 mg | PEN_INJECTOR | SUBCUTANEOUS | 2 refills | Status: DC

## 2022-11-07 MED ORDER — VITAMIN D (ERGOCALCIFEROL) 1.25 MG (50000 UNIT) PO CAPS: ORAL_CAPSULE | ORAL | 0 refills | Status: DC

## 2022-11-13 ENCOUNTER — Other Ambulatory Visit (HOSPITAL_BASED_OUTPATIENT_CLINIC_OR_DEPARTMENT_OTHER): Payer: Self-pay

## 2022-11-19 ENCOUNTER — Other Ambulatory Visit: Payer: Self-pay | Admitting: Nurse Practitioner

## 2022-11-19 DIAGNOSIS — K219 Gastro-esophageal reflux disease without esophagitis: Secondary | ICD-10-CM

## 2022-12-26 ENCOUNTER — Other Ambulatory Visit (HOSPITAL_BASED_OUTPATIENT_CLINIC_OR_DEPARTMENT_OTHER): Payer: Self-pay

## 2023-01-09 ENCOUNTER — Telehealth: Payer: Self-pay

## 2023-01-09 NOTE — Telephone Encounter (Signed)
Attempted to call patient to ask what prescriptions need refilled and was unable to leave a voicemail Patient is asking for 90 day supply be sent to: [facility] P: [phone] F: [phone] E-mail [email or web address] .Clyda Greener is the prescription drug insurance provider [employer]

## 2023-01-25 ENCOUNTER — Other Ambulatory Visit: Payer: Self-pay | Admitting: Nurse Practitioner

## 2023-01-25 DIAGNOSIS — E1165 Type 2 diabetes mellitus with hyperglycemia: Secondary | ICD-10-CM

## 2023-01-29 ENCOUNTER — Ambulatory Visit (INDEPENDENT_AMBULATORY_CARE_PROVIDER_SITE_OTHER): Payer: BC Managed Care – PPO | Admitting: Nurse Practitioner

## 2023-01-29 ENCOUNTER — Encounter: Payer: Self-pay | Admitting: Nurse Practitioner

## 2023-01-29 VITALS — BP 168/110 | HR 82 | Temp 98.2°F | Resp 18 | Ht 65.0 in | Wt 266.2 lb

## 2023-01-29 DIAGNOSIS — E119 Type 2 diabetes mellitus without complications: Secondary | ICD-10-CM

## 2023-01-29 DIAGNOSIS — M85851 Other specified disorders of bone density and structure, right thigh: Secondary | ICD-10-CM

## 2023-01-29 DIAGNOSIS — Z7985 Long-term (current) use of injectable non-insulin antidiabetic drugs: Secondary | ICD-10-CM

## 2023-01-29 DIAGNOSIS — Z79811 Long term (current) use of aromatase inhibitors: Secondary | ICD-10-CM

## 2023-01-29 DIAGNOSIS — Z794 Long term (current) use of insulin: Secondary | ICD-10-CM | POA: Diagnosis not present

## 2023-01-29 DIAGNOSIS — E1169 Type 2 diabetes mellitus with other specified complication: Secondary | ICD-10-CM | POA: Insufficient documentation

## 2023-01-29 DIAGNOSIS — I10 Essential (primary) hypertension: Secondary | ICD-10-CM

## 2023-01-29 DIAGNOSIS — M85852 Other specified disorders of bone density and structure, left thigh: Secondary | ICD-10-CM

## 2023-01-29 DIAGNOSIS — Z7984 Long term (current) use of oral hypoglycemic drugs: Secondary | ICD-10-CM | POA: Insufficient documentation

## 2023-01-29 DIAGNOSIS — E785 Hyperlipidemia, unspecified: Secondary | ICD-10-CM

## 2023-01-29 DIAGNOSIS — F339 Major depressive disorder, recurrent, unspecified: Secondary | ICD-10-CM

## 2023-01-29 LAB — POCT GLYCOSYLATED HEMOGLOBIN (HGB A1C): Hemoglobin A1C: 11.9 % — AB (ref 4.0–5.6)

## 2023-01-29 LAB — MICROALBUMIN / CREATININE URINE RATIO
Creatinine,U: 78.7 mg/dL
Microalb Creat Ratio: 14.2 mg/g (ref 0.0–30.0)
Microalb, Ur: 11.2 mg/dL — ABNORMAL HIGH (ref 0.0–1.9)

## 2023-01-29 MED ORDER — PRAVASTATIN SODIUM 20 MG PO TABS: 20.0000 mg | ORAL_TABLET | Freq: Every day | ORAL | 3 refills | Status: DC

## 2023-01-29 MED ORDER — FREESTYLE LIBRE 14 DAY SENSOR MISC
1.0000 [IU] | 11 refills | Status: DC
Start: 2023-01-29 — End: 2023-06-27

## 2023-01-29 MED ORDER — METFORMIN HCL 850 MG PO TABS: 850.0000 mg | ORAL_TABLET | Freq: Two times a day (BID) | ORAL | 1 refills | Status: DC

## 2023-01-29 MED ORDER — ALENDRONATE SODIUM 70 MG PO TABS
70.0000 mg | ORAL_TABLET | ORAL | 3 refills | Status: DC
Start: 2023-01-29 — End: 2023-04-04

## 2023-01-29 MED ORDER — CLONIDINE HCL 0.1 MG PO TABS
0.1000 mg | ORAL_TABLET | Freq: Two times a day (BID) | ORAL | 3 refills | Status: DC
Start: 2023-01-29 — End: 2023-04-04

## 2023-01-29 MED ORDER — LISINOPRIL 40 MG PO TABS: 40.0000 mg | ORAL_TABLET | Freq: Every day | ORAL | 3 refills | Status: DC

## 2023-01-29 MED ORDER — INSULIN GLARGINE 100 UNIT/ML SOLOSTAR PEN: 20.0000 [IU] | PEN_INJECTOR | Freq: Every day | SUBCUTANEOUS | 3 refills | Status: DC

## 2023-01-29 MED ORDER — AMLODIPINE BESYLATE 5 MG PO TABS
5.0000 mg | ORAL_TABLET | Freq: Every day | ORAL | 3 refills | Status: DC
Start: 2023-01-29 — End: 2023-04-04

## 2023-01-29 MED ORDER — GLIPIZIDE ER 10 MG PO TB24: 10.0000 mg | ORAL_TABLET | Freq: Every day | ORAL | 3 refills | Status: DC

## 2023-01-29 MED ORDER — OZEMPIC (0.25 OR 0.5 MG/DOSE) 2 MG/1.5ML ~~LOC~~ SOPN: 0.2500 mg | PEN_INJECTOR | SUBCUTANEOUS | 2 refills | Status: DC

## 2023-01-29 MED ORDER — DULOXETINE HCL 30 MG PO CPEP: 30.0000 mg | ORAL_CAPSULE | Freq: Every day | ORAL | 3 refills | Status: DC

## 2023-01-29 MED ORDER — METOPROLOL SUCCINATE ER 100 MG PO TB24: 100.0000 mg | ORAL_TABLET | Freq: Every day | ORAL | 3 refills | Status: DC

## 2023-01-29 MED ORDER — PEN NEEDLES 31G X 6 MM MISC: 1.0000 | Freq: Every day | 0 refills | Status: DC

## 2023-01-29 NOTE — Assessment & Plan Note (Signed)
Stable with cymbalta.  

## 2023-01-29 NOTE — Patient Instructions (Addendum)
Go to lab hgbA1c at 11.9% Take medications as prescribed Check glucose (before breakfast) and BP in AM Bring all medications, glucose and BP readings to next appointment. Maintain low salt, low carb and low sugar diet. Schedule appointment for mammogram and DIABETES eye exam

## 2023-01-29 NOTE — Assessment & Plan Note (Addendum)
Bone density 2022: The BMD measured at Femur Neck Right is 0.753 g/cm2 with a T-score of -2.1. This patient is considered osteopenic/low bone mass according to World Health Organization [facility]) criteria. Current use of armidex daily Current use of fosamax weekly Advised to maintain OVER THE COUNTER calcium and vit. D dose. Repeat dexa scan

## 2023-01-29 NOTE — Assessment & Plan Note (Signed)
repeat HgbA1c at 11% Uncontrolled due to non compliance Currently only taking metformin 850mg  BID and lantus 20units EOD She did not maintain DIABETES eye exam clinic with mobile clinic on 01/16/23 BP not at goal LDL at goal  Advised about the consequences of uncontrolled DIABETES and HYPERTENSION, including death. Advised to take meds as prescribed, check BP daily, and schedule appointment for DIABETES eye exam. F/up in 60month.

## 2023-01-29 NOTE — Progress Notes (Signed)
Established Patient Visit  Patient: Melissa Bray   DOB: 1960-07-22   62 y.o. Female  MRN: [id number] Visit Date: 01/29/2023  Subjective:    Chief Complaint  Patient presents with   office visit     PT is here for 3 month follow up hypertension, diabetes, hyperlipidemia and depression. PT is due for immunizations, mammogram and eye exam.    HPI Essential hypertension BP at not goal due to lack of amlodipine and metoprolol x 78month. Currently takes only clonidine and lisisnopril BP Readings from Last 3 Encounters:  01/29/23 (!) 168/110  10/29/22 (!) 150/100  04/23/22 122/82    Med refills sent Advised about importance of med compliance F/up in 3months  DM (diabetes mellitus) (HCC) repeat HgbA1c at 11% Uncontrolled due to non compliance Currently only taking metformin 850mg  BID and lantus 20units EOD She did not maintain DIABETES eye exam clinic with mobile clinic on 01/16/23 BP not at goal LDL at goal  Advised about the consequences of uncontrolled DIABETES and HYPERTENSION, including death. Advised to take meds as prescribed, check BP daily, and schedule appointment for DIABETES eye exam. F/up in 78month.  Osteopenia Bone density 2022: The BMD measured at Femur Neck Right is 0.753 g/cm2 with a T-score of -2.1. This patient is considered osteopenic/low bone mass according to World Health Organization [facility]) criteria. Current use of armidex daily Current use of fosamax weekly Advised to maintain OVER THE COUNTER calcium and vit. D dose. Repeat dexa scan  Depression, recurrent (HCC) Stable with cymbalta  Reviewed medical, surgical, and social history today  Medications: Outpatient Medications Prior to Visit  Medication Sig   anastrozole (ARIMIDEX) 1 MG tablet Take 1 mg by mouth daily.   famotidine (PEPCID) 20 MG tablet TAKE 1 TABLET BY MOUTH TWICE A DAY   Vitamin D, Ergocalciferol, (DRISDOL) 1.25 MG (50000 UNIT) CAPS capsule TAKE 1 CAPSULE ONCE A WEEK  FOR 12 WEEKS. AFTER 12 WEEKS SWITCH TO OVER THE COUNTER VITAMIN D 2000 IU   [DISCONTINUED] alendronate (FOSAMAX) 70 MG tablet Take 1 tablet (70 mg total) by mouth once a week. Take with a full glass of water on an empty stomach.   [DISCONTINUED] amLODipine (NORVASC) 5 MG tablet Take 1 tablet (5 mg total) by mouth at bedtime.   [DISCONTINUED] cloNIDine (CATAPRES) 0.1 MG tablet Take 1 tablet (0.1 mg total) by mouth 2 (two) times daily.   [DISCONTINUED] Continuous Glucose Sensor (FREESTYLE LIBRE 14 DAY SENSOR) MISC 1 Units by Does not apply route every 14 (fourteen) days.   [DISCONTINUED] DULoxetine (CYMBALTA) 30 MG capsule Take 1 capsule (30 mg total) by mouth daily.   [DISCONTINUED] glipiZIDE (GLUCOTROL XL) 10 MG 24 hr tablet Take 1 tablet (10 mg total) by mouth daily with breakfast.   [DISCONTINUED] insulin glargine (LANTUS) 100 UNIT/ML Solostar Pen Inject 20 Units into the skin daily.   [DISCONTINUED] Insulin Pen Needle (PEN NEEDLES) 31G X 6 MM MISC 1 application  by Does not apply route at bedtime.   [DISCONTINUED] lisinopril (ZESTRIL) 40 MG tablet Take 1 tablet (40 mg total) by mouth daily.   [DISCONTINUED] metFORMIN (GLUCOPHAGE) 850 MG tablet TAKE 1 TABLET BY MOUTH TWICE A DAY WITH FOOD   [DISCONTINUED] metoprolol succinate (TOPROL-XL) 100 MG 24 hr tablet Take 1 tablet (100 mg total) by mouth daily.   [DISCONTINUED] pravastatin (PRAVACHOL) 20 MG tablet Take 1 tablet (20 mg total) by mouth daily.   [DISCONTINUED] Semaglutide,0.25  or 0.5MG /DOS, (OZEMPIC, 0.25 OR 0.5 MG/DOSE,) 2 MG/1.5ML SOPN Inject 0.25 mg into the skin once a week.   No facility-administered medications prior to visit.   Reviewed past medical and social history.   ROS per HPI above      Objective:  BP (!) 168/110 (BP Location: Right Arm, Patient Position: Sitting, Cuff Size: Large) Comment: recheck BP reading  Pulse 82   Temp 98.2 F (36.8 C) (Temporal)   Resp 18   Ht 5\' 5"  (1.651 m)   Wt 266 lb 3.2 oz (120.7 kg)    SpO2 98%   BMI 44.30 kg/m      Physical Exam Vitals reviewed.  Cardiovascular:     Rate and Rhythm: Normal rate.     Pulses: Normal pulses.  Pulmonary:     Effort: Pulmonary effort is normal.  Neurological:     Mental Status: She is alert and oriented to person, place, and time.     Results for orders placed or performed in visit on 01/29/23  Microalbumin / creatinine urine ratio  Result Value Ref Range   Microalb, Ur 11.2 (H) 0.0 - 1.9 mg/dL   Creatinine,U 16.1 mg/dL   Microalb Creat Ratio 14.2 0.0 - 30.0 mg/g  POCT glycosylated hemoglobin (Hb A1C)  Result Value Ref Range   Hemoglobin A1C 11.9 (A) 4.0 - 5.6 %   HbA1c POC (<> result, manual entry)     HbA1c, POC (prediabetic range)     HbA1c, POC (controlled diabetic range)        Assessment & Plan:    Problem List Items Addressed This Visit     Depression, recurrent (HCC)    Stable with cymbalta      Relevant Medications   DULoxetine (CYMBALTA) 30 MG capsule   Diabetes mellitus treated with insulin and oral medication (HCC)   Relevant Medications   Continuous Glucose Sensor (FREESTYLE LIBRE 14 DAY SENSOR) MISC   glipiZIDE (GLUCOTROL XL) 10 MG 24 hr tablet   insulin glargine (LANTUS) 100 UNIT/ML Solostar Pen   Insulin Pen Needle (PEN NEEDLES) 31G X 6 MM MISC   lisinopril (ZESTRIL) 40 MG tablet   metFORMIN (GLUCOPHAGE) 850 MG tablet   pravastatin (PRAVACHOL) 20 MG tablet   Semaglutide,0.25 or 0.5MG /DOS, (OZEMPIC, 0.25 OR 0.5 MG/DOSE,) 2 MG/1.5ML SOPN   DM (diabetes mellitus) (HCC)    repeat HgbA1c at 11% Uncontrolled due to non compliance Currently only taking metformin 850mg  BID and lantus 20units EOD She did not maintain DIABETES eye exam clinic with mobile clinic on 01/16/23 BP not at goal LDL at goal  Advised about the consequences of uncontrolled DIABETES and HYPERTENSION, including death. Advised to take meds as prescribed, check BP daily, and schedule appointment for DIABETES eye exam. F/up in  68month.      Relevant Medications   Continuous Glucose Sensor (FREESTYLE LIBRE 14 DAY SENSOR) MISC   glipiZIDE (GLUCOTROL XL) 10 MG 24 hr tablet   insulin glargine (LANTUS) 100 UNIT/ML Solostar Pen   Insulin Pen Needle (PEN NEEDLES) 31G X 6 MM MISC   lisinopril (ZESTRIL) 40 MG tablet   metFORMIN (GLUCOPHAGE) 850 MG tablet   pravastatin (PRAVACHOL) 20 MG tablet   Semaglutide,0.25 or 0.5MG /DOS, (OZEMPIC, 0.25 OR 0.5 MG/DOSE,) 2 MG/1.5ML SOPN   Other Relevant Orders   Microalbumin / creatinine urine ratio (Completed)   POCT glycosylated hemoglobin (Hb A1C) (Completed)   Essential hypertension - Primary    BP at not goal due to lack of amlodipine and metoprolol x  65month. Currently takes only clonidine and lisisnopril BP Readings from Last 3 Encounters:  01/29/23 (!) 168/110  10/29/22 (!) 150/100  04/23/22 122/82    Med refills sent Advised about importance of med compliance F/up in 3months      Relevant Medications   amLODipine (NORVASC) 5 MG tablet   cloNIDine (CATAPRES) 0.1 MG tablet   lisinopril (ZESTRIL) 40 MG tablet   metoprolol succinate (TOPROL-XL) 100 MG 24 hr tablet   pravastatin (PRAVACHOL) 20 MG tablet   Hyperlipidemia associated with type 2 diabetes mellitus (HCC)   Relevant Medications   amLODipine (NORVASC) 5 MG tablet   cloNIDine (CATAPRES) 0.1 MG tablet   glipiZIDE (GLUCOTROL XL) 10 MG 24 hr tablet   insulin glargine (LANTUS) 100 UNIT/ML Solostar Pen   lisinopril (ZESTRIL) 40 MG tablet   metFORMIN (GLUCOPHAGE) 850 MG tablet   metoprolol succinate (TOPROL-XL) 100 MG 24 hr tablet   pravastatin (PRAVACHOL) 20 MG tablet   Semaglutide,0.25 or 0.5MG /DOS, (OZEMPIC, 0.25 OR 0.5 MG/DOSE,) 2 MG/1.5ML SOPN   Long term (current) use of aromatase inhibitors   Relevant Medications   alendronate (FOSAMAX) 70 MG tablet   Long-term (current) use of injectable non-insulin antidiabetic drugs   Relevant Medications   Continuous Glucose Sensor (FREESTYLE LIBRE 14 DAY SENSOR)  MISC   glipiZIDE (GLUCOTROL XL) 10 MG 24 hr tablet   insulin glargine (LANTUS) 100 UNIT/ML Solostar Pen   Insulin Pen Needle (PEN NEEDLES) 31G X 6 MM MISC   Semaglutide,0.25 or 0.5MG /DOS, (OZEMPIC, 0.25 OR 0.5 MG/DOSE,) 2 MG/1.5ML SOPN   Osteopenia    Bone density 2022: The BMD measured at Femur Neck Right is 0.753 g/cm2 with a T-score of -2.1. This patient is considered osteopenic/low bone mass according to World Health Organization [facility]) criteria. Current use of armidex daily Current use of fosamax weekly Advised to maintain OVER THE COUNTER calcium and vit. D dose. Repeat dexa scan      Relevant Medications   alendronate (FOSAMAX) 70 MG tablet   Other Relevant Orders   DG Bone Density   Return in about 4 weeks (around 02/26/2023) for HTN, DM.     Alysia Penna, NP

## 2023-01-29 NOTE — Assessment & Plan Note (Signed)
BP at not goal due to lack of amlodipine and metoprolol x 48month. Currently takes only clonidine and lisisnopril BP Readings from Last 3 Encounters:  01/29/23 (!) 168/110  10/29/22 (!) 150/100  04/23/22 122/82    Med refills sent Advised about importance of med compliance F/up in 3months

## 2023-01-30 DIAGNOSIS — Z1231 Encounter for screening mammogram for malignant neoplasm of breast: Secondary | ICD-10-CM | POA: Diagnosis not present

## 2023-02-03 DIAGNOSIS — S76211A Strain of adductor muscle, fascia and tendon of right thigh, initial encounter: Secondary | ICD-10-CM | POA: Diagnosis not present

## 2023-02-03 DIAGNOSIS — S76811A Strain of other specified muscles, fascia and tendons at thigh level, right thigh, initial encounter: Secondary | ICD-10-CM | POA: Diagnosis not present

## 2023-02-03 DIAGNOSIS — X509XXA Other and unspecified overexertion or strenuous movements or postures, initial encounter: Secondary | ICD-10-CM | POA: Diagnosis not present

## 2023-02-03 DIAGNOSIS — R1031 Right lower quadrant pain: Secondary | ICD-10-CM | POA: Diagnosis not present

## 2023-02-03 DIAGNOSIS — M79604 Pain in right leg: Secondary | ICD-10-CM | POA: Diagnosis not present

## 2023-02-03 DIAGNOSIS — N202 Calculus of kidney with calculus of ureter: Secondary | ICD-10-CM | POA: Diagnosis not present

## 2023-02-03 DIAGNOSIS — K760 Fatty (change of) liver, not elsewhere classified: Secondary | ICD-10-CM | POA: Diagnosis not present

## 2023-02-03 LAB — HEPATIC FUNCTION PANEL
ALT: 29 U/L (ref 7–35)
AST: 23 (ref 13–35)
Alkaline Phosphatase: 83 (ref 25–125)
Bilirubin, Total: 0.4

## 2023-02-03 LAB — BASIC METABOLIC PANEL
BUN: 12 (ref 4–21)
CO2: 28 — AB (ref 13–22)
Chloride: 99 (ref 99–108)
Creatinine: 0.8 (ref 0.5–1.1)
Potassium: 4.1 meq/L (ref 3.5–5.1)
Sodium: 134 — AB (ref 137–147)

## 2023-02-03 LAB — COMPREHENSIVE METABOLIC PANEL
Albumin: 3.9 (ref 3.5–5.0)
eGFR: 82

## 2023-02-03 LAB — CBC AND DIFFERENTIAL
HCT: 42 (ref 36–46)
Hemoglobin: 13.9 (ref 12.0–16.0)
Neutrophils Absolute: 2.1
Platelets: 169 10*3/uL (ref 150–400)
WBC: 4.3

## 2023-02-03 LAB — CBC: RBC: 4.81 (ref 3.87–5.11)

## 2023-02-03 LAB — HM MAMMOGRAPHY

## 2023-02-06 LAB — HM MAMMOGRAPHY

## 2023-02-26 ENCOUNTER — Ambulatory Visit: Payer: BC Managed Care – PPO | Admitting: Nurse Practitioner

## 2023-03-11 ENCOUNTER — Other Ambulatory Visit (HOSPITAL_BASED_OUTPATIENT_CLINIC_OR_DEPARTMENT_OTHER): Payer: Self-pay

## 2023-03-17 ENCOUNTER — Other Ambulatory Visit (HOSPITAL_BASED_OUTPATIENT_CLINIC_OR_DEPARTMENT_OTHER): Payer: Self-pay

## 2023-04-03 ENCOUNTER — Encounter: Payer: Self-pay | Admitting: Nurse Practitioner

## 2023-04-03 ENCOUNTER — Telehealth: Payer: Self-pay | Admitting: Nurse Practitioner

## 2023-04-03 ENCOUNTER — Ambulatory Visit (INDEPENDENT_AMBULATORY_CARE_PROVIDER_SITE_OTHER): Payer: BC Managed Care – PPO | Admitting: Nurse Practitioner

## 2023-04-03 VITALS — BP 172/110 | HR 91 | Temp 98.0°F | Resp 18 | Wt 264.2 lb

## 2023-04-03 DIAGNOSIS — E1169 Type 2 diabetes mellitus with other specified complication: Secondary | ICD-10-CM

## 2023-04-03 DIAGNOSIS — I10 Essential (primary) hypertension: Secondary | ICD-10-CM

## 2023-04-03 DIAGNOSIS — N898 Other specified noninflammatory disorders of vagina: Secondary | ICD-10-CM

## 2023-04-03 DIAGNOSIS — Z7985 Long-term (current) use of injectable non-insulin antidiabetic drugs: Secondary | ICD-10-CM

## 2023-04-03 DIAGNOSIS — E559 Vitamin D deficiency, unspecified: Secondary | ICD-10-CM

## 2023-04-03 DIAGNOSIS — E785 Hyperlipidemia, unspecified: Secondary | ICD-10-CM

## 2023-04-03 DIAGNOSIS — C50919 Malignant neoplasm of unspecified site of unspecified female breast: Secondary | ICD-10-CM | POA: Diagnosis not present

## 2023-04-03 DIAGNOSIS — Z794 Long term (current) use of insulin: Secondary | ICD-10-CM | POA: Diagnosis not present

## 2023-04-03 DIAGNOSIS — Z79811 Long term (current) use of aromatase inhibitors: Secondary | ICD-10-CM

## 2023-04-03 DIAGNOSIS — M85851 Other specified disorders of bone density and structure, right thigh: Secondary | ICD-10-CM

## 2023-04-03 DIAGNOSIS — F339 Major depressive disorder, recurrent, unspecified: Secondary | ICD-10-CM

## 2023-04-03 DIAGNOSIS — E119 Type 2 diabetes mellitus without complications: Secondary | ICD-10-CM

## 2023-04-03 LAB — MICROALBUMIN / CREATININE URINE RATIO
Creatinine,U: 128.6 mg/dL
Microalb Creat Ratio: 17.5 mg/g (ref 0.0–30.0)
Microalb, Ur: 22.5 mg/dL — ABNORMAL HIGH (ref 0.0–1.9)

## 2023-04-03 LAB — HEMOGLOBIN A1C: Hgb A1c MFr Bld: 13.8 % — ABNORMAL HIGH (ref 4.6–6.5)

## 2023-04-03 LAB — LDL CHOLESTEROL, DIRECT: Direct LDL: 97 mg/dL

## 2023-04-03 IMAGING — DX DG ANKLE COMPLETE 3+V*R*
3 series · 3 of 3 positions shown · non-contrast
Comparison: None.

CLINICAL DATA: Stepped back on right foot yesterday. Missed step
and now right ankle pain. Swelling. Lateral pain.

EXAM:
RIGHT ANKLE - COMPLETE 3+ VIEW

[ankle ap]
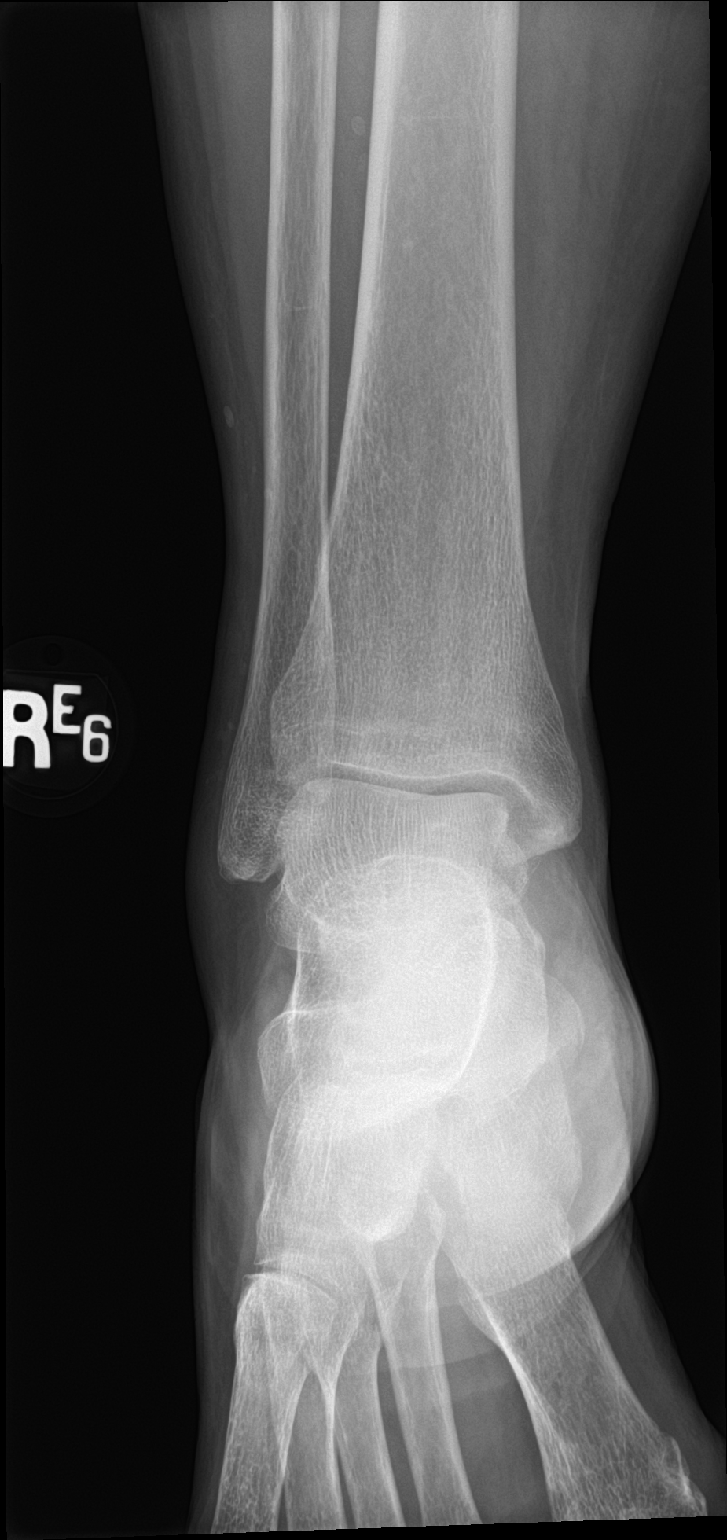

[ankle obl]
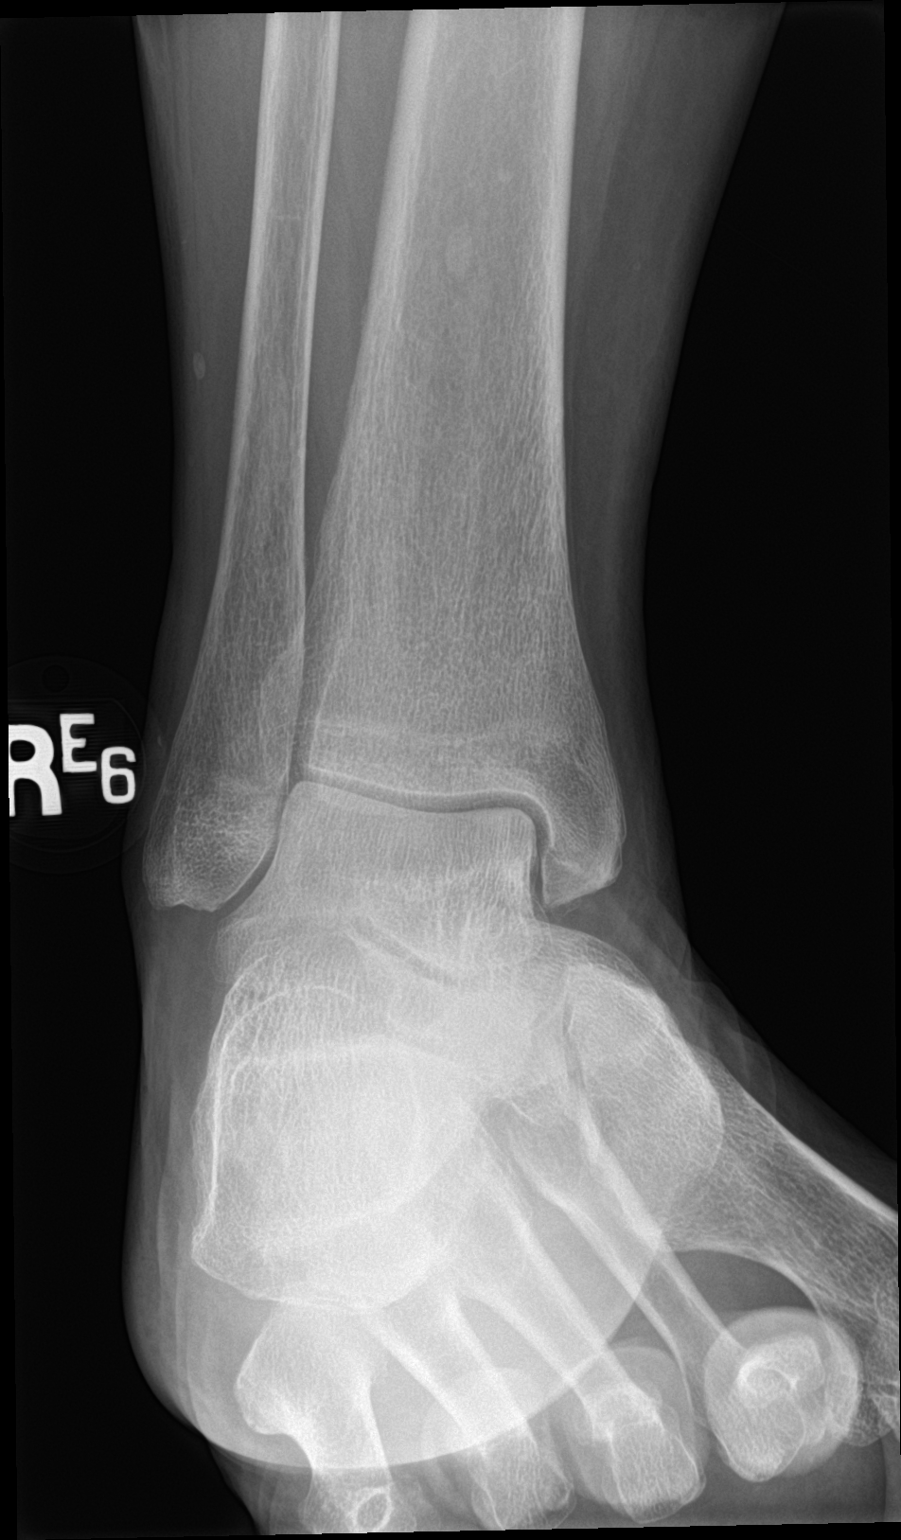

[ankle lat]
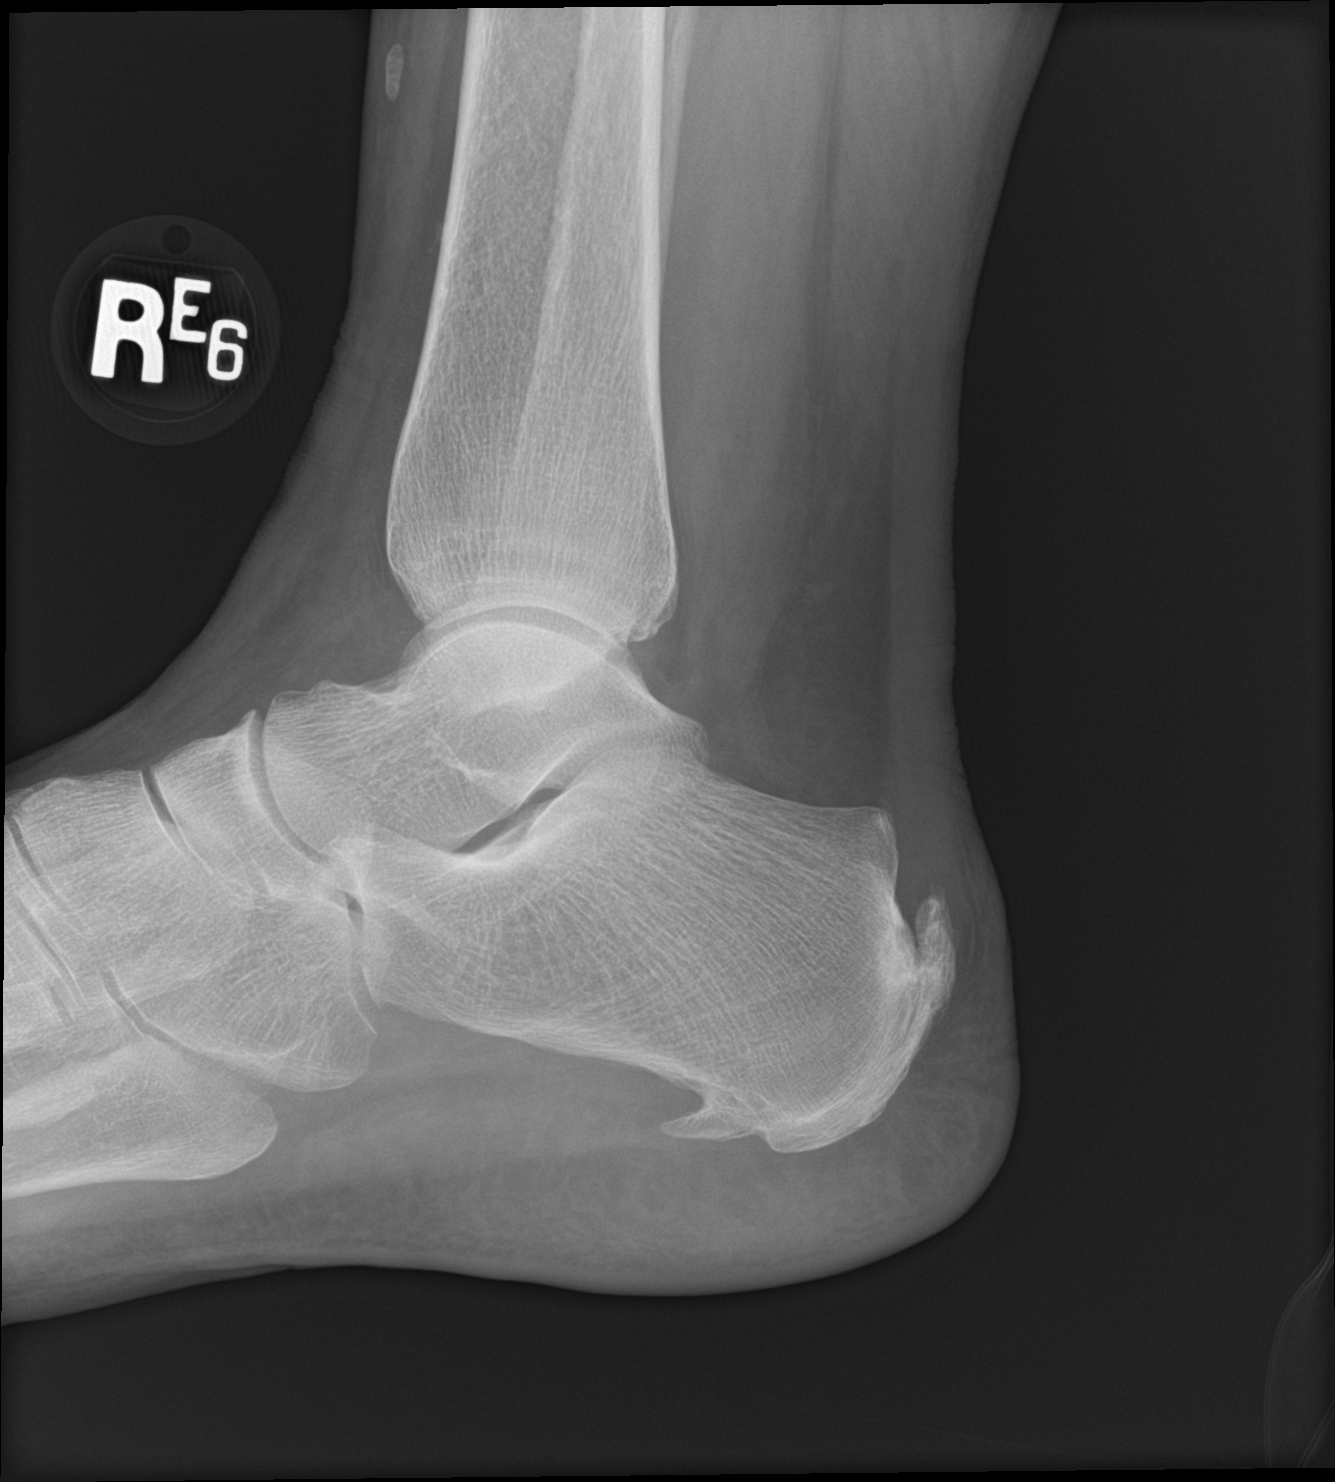

[3 of 3 positions shown; findings below may reference images not displayed]

FINDINGS: Ankle mortise is symmetric and intact. Mild joint space narrowing of
the distal fibula and medial malleolar articulations with talus.
Minimal posterior distal tibial plafond degenerative osteophytosis.
Moderate-to-large plantar calcaneal heel spur. Moderate chronic
spurring at the Achilles insertion on the calcaneus. No acute
fracture is seen. No dislocation. Minimal lateral malleolar soft
tissue swelling.
IMPRESSION: :
IMPRESSION: 1. No acute fracture is seen.
2. Mild tibiotalar osteoarthritis.
3. Moderate to large plantar calcaneal heel spur and moderate
chronic spurring at the Achilles insertion on the calcaneus.

## 2023-04-03 IMAGING — DX DG FOOT COMPLETE 3+V*R*
3 series · 3 of 3 positions shown · non-contrast
Comparison: None.

CLINICAL DATA: Trauma, fall

EXAM:
RIGHT FOOT COMPLETE - 3+ VIEW

[foot ap]
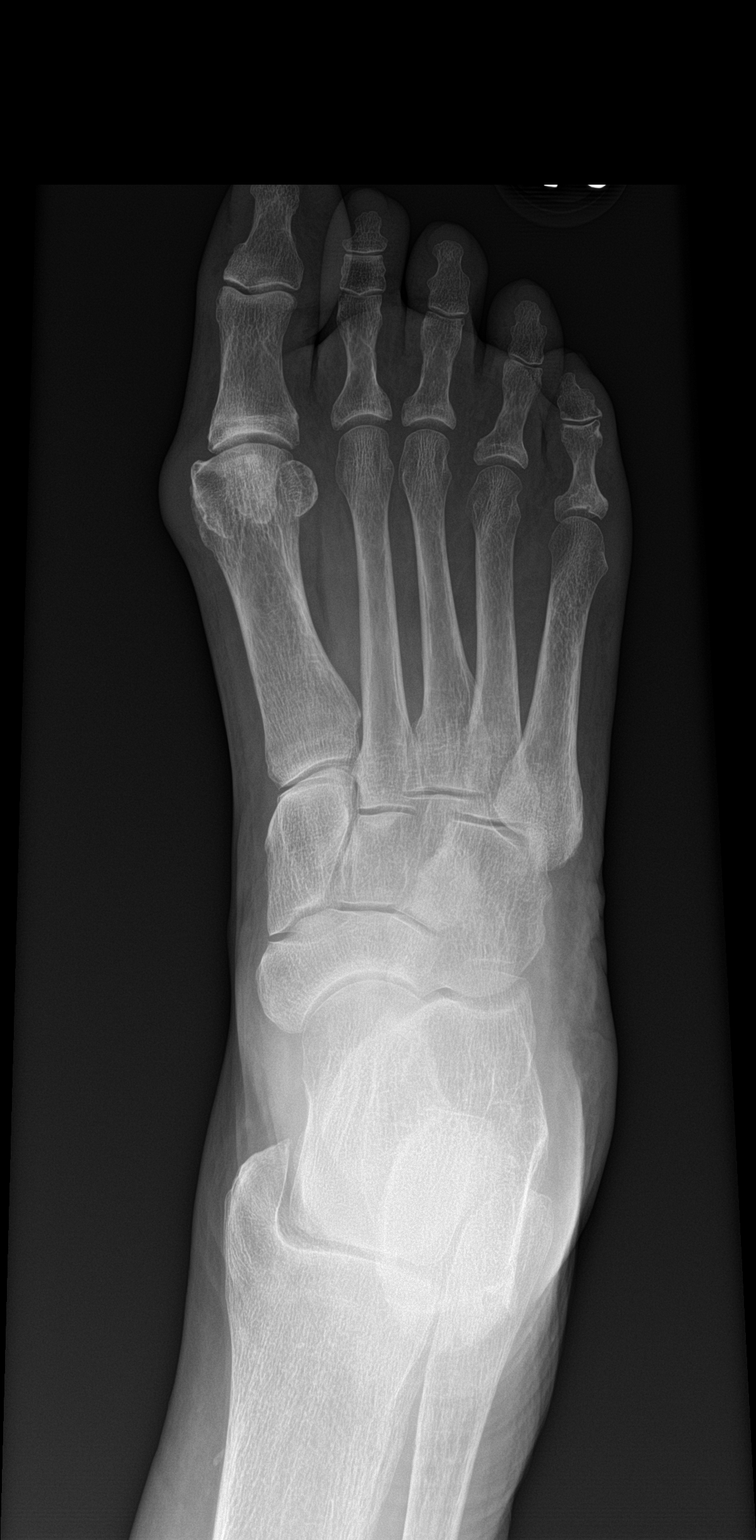

[foot obl]
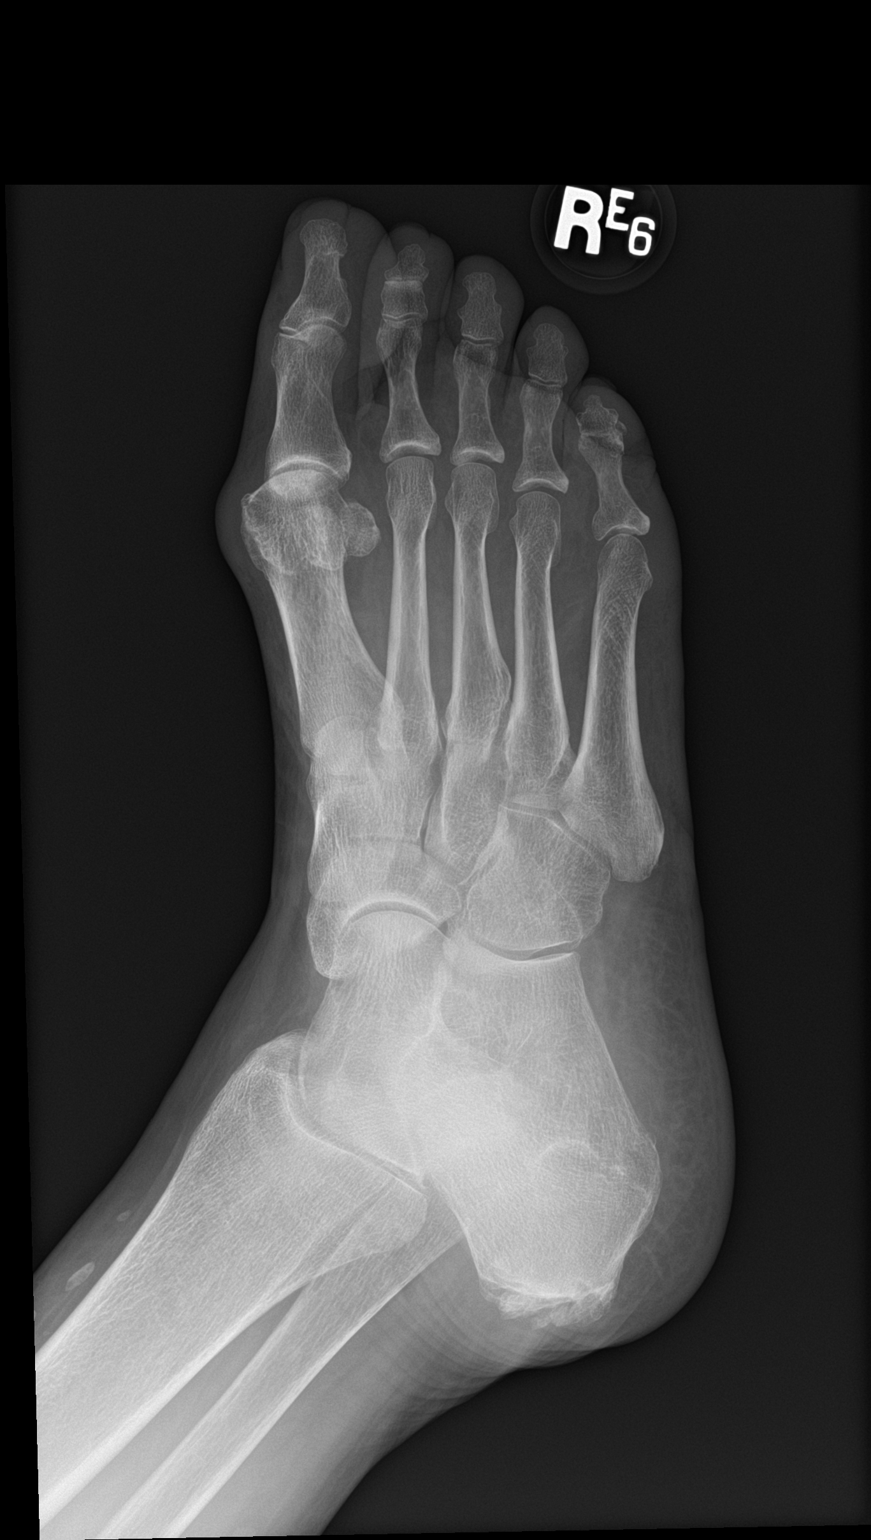

[foot lat]
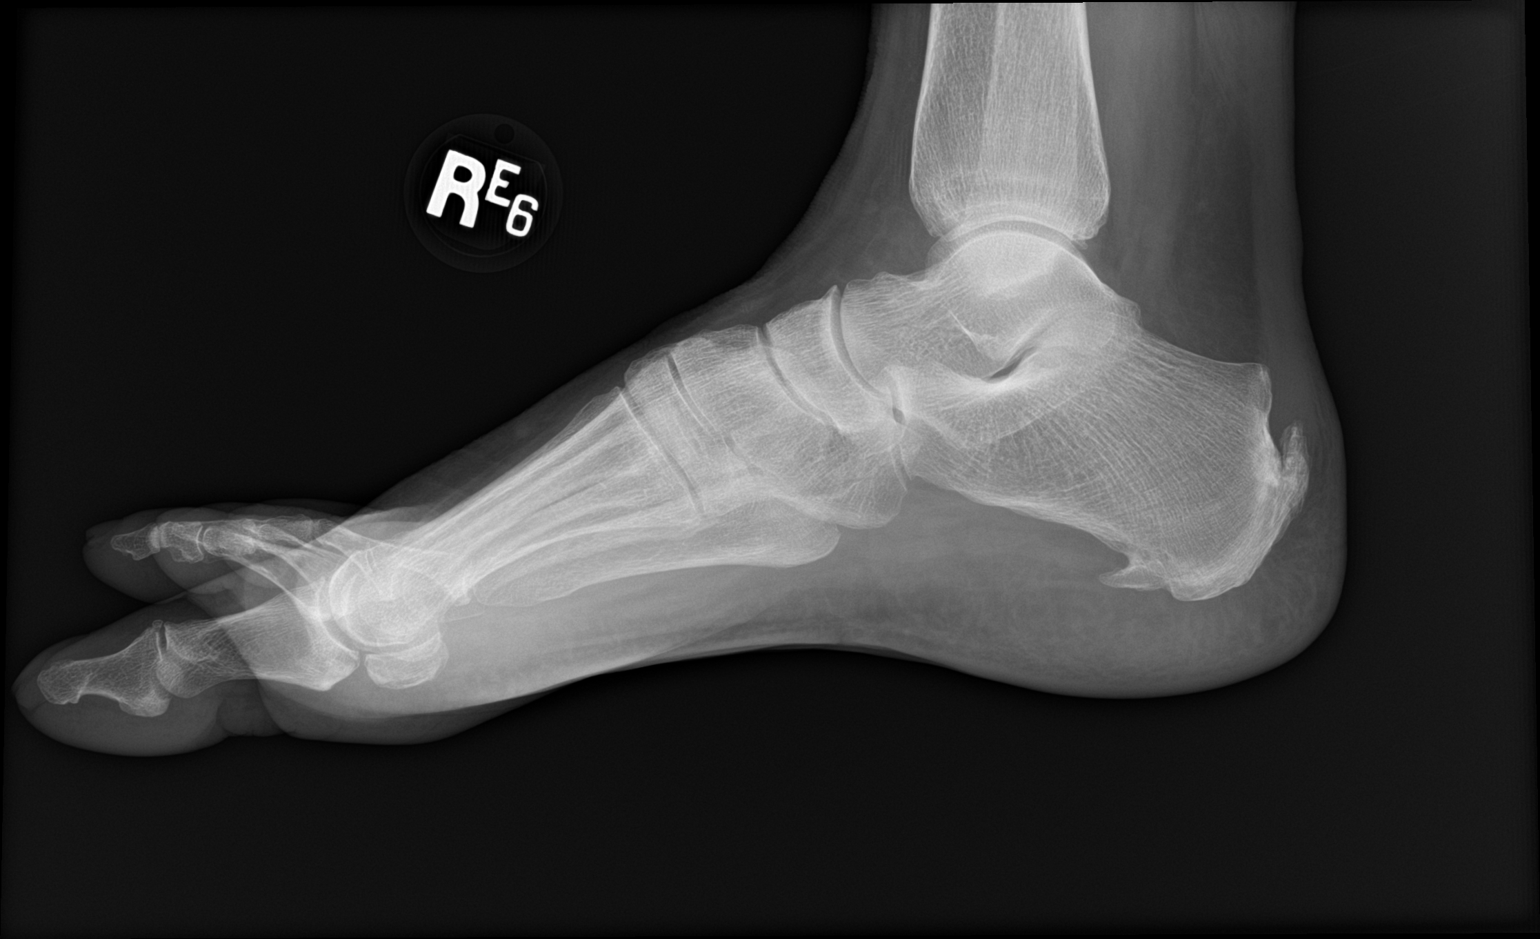

[3 of 3 positions shown; findings below may reference images not displayed]

FINDINGS: No displaced fracture or dislocation is seen. There is small
radiolucency in the articular surface of base of proximal phalanx of
fifth toe. There is adjacent sclerosis. Degenerative changes are
noted in first metatarsophalangeal joint. Plantar spur is seen in
the calcaneus. There is possible calcific tendinosis at the
attachment of Achilles tendon to the calcaneus. Small bony spurs
seen in the dorsal aspect of talonavicular joint.
IMPRESSION: There is small radiolucency in the base of proximal phalanx of right
fifth toe which may suggest recent or old undisplaced fracture.

Other findings as described in the body of the report.

## 2023-04-03 NOTE — Assessment & Plan Note (Signed)
 Has not been on pravastatin due to pharmacy discrepancy.  Advised to send name of right pharmacy via mychart ASAP.

## 2023-04-03 NOTE — Telephone Encounter (Signed)
 Patient dropped off document  pt dropped off a note , to be filled out by provider. Patient requested to send it back via Call Patient to pick up within 7-days. Document is located in providers tray at front office.Please advise at Mobile [phone] (mobile)   I put the not in the dr box

## 2023-04-03 NOTE — Patient Instructions (Addendum)
 Send name and phone number of pharmacy via mychart. Use miconazole/monistat vaginal cream and benadryl for itching Go to lab

## 2023-04-03 NOTE — Assessment & Plan Note (Signed)
 Current use of anastrozole  Under the care of [facility] oncology, Last OV 05/2022 Per oncology: Unable to repeat PET scan due to insurance coverage S/p right mastectomy with axilary lymph node dissection, completed chemotherapy Bone density 2022: osteopenia, current use of fosamax , vit. D and calcium  Last mammogram 2024: normal F/up annually

## 2023-04-03 NOTE — Assessment & Plan Note (Addendum)
 She has not used ozempic  or lantus  or glipizide  x 3months due to dispensing pharmacy discrepancy. Insurance will not cover meds dispensed by [facility] Current use of glipizide  only Today she reports generalized itching including vaginal itching. No discharge or odor or pelvic pain or dysuria or frequency.  No rash noted Advised to send name of right pharmacy via mychart ASAP Advised to use benadryl tab and monistat vaginal cream Repeat hgbA1c at [facility] Last CMP 01/2023: random glucose at 422 Will hold ozempic  Maintain other med doses

## 2023-04-03 NOTE — Assessment & Plan Note (Addendum)
 She has been out of lisinopril  and amlodipine  x 3months due to discrepancy in dispensing pharmacy. Her insurance will not cover meds dispensed by [facility] Current use of clonidine  and metoprolol  only BP Readings from Last 3 Encounters:  04/03/23 (!) 140/100  01/29/23 (!) 168/110  10/29/22 (!) 150/100    Advised to sent name of right pharmacy via mychart ASAP F/up in 48month

## 2023-04-03 NOTE — Progress Notes (Signed)
 Established Patient Visit  Patient: Melissa Bray   DOB: [birth year]   63 y.o. Female  MRN: [id number] Visit Date: 04/03/2023  Subjective:    Chief Complaint  Patient presents with   OFFICE VISIT     3 month follow up. Mammogram was done at [facility] and eye exam scheduled for April 2025   Hypertension   Diabetes   Essential hypertension She has been out of lisinopril  and amlodipine  x 3months due to discrepancy in dispensing pharmacy. Her insurance will not cover meds dispensed by [facility] Current use of clonidine  and metoprolol  only BP Readings from Last 3 Encounters:  04/03/23 (!) 140/100  01/29/23 (!) 168/110  10/29/22 (!) 150/100    Advised to sent name of right pharmacy via mychart ASAP F/up in 16month  DM (diabetes mellitus) (HCC) She has not used ozempic  or lantus  or glipizide  x 3months due to dispensing pharmacy discrepancy. Insurance will not cover meds dispensed by [facility] Current use of glipizide  only Today she reports generalized itching including vaginal itching. No discharge or odor or pelvic pain or dysuria or frequency.  No rash noted Advised to send name of right pharmacy via mychart ASAP Advised to use benadryl tab and monistat vaginal cream Repeat hgbA1c at [facility] Last CMP 01/2023: random glucose at 422 Will hold ozempic  Maintain other med doses  Hyperlipidemia associated with type 2 diabetes mellitus (HCC) Has not been on pravastatin  due to pharmacy discrepancy.  Advised to send name of right pharmacy via mychart ASAP.  Breast cancer, stage 3 (HCC) Current use of anastrozole  Under the care of [facility] oncology, Last OV 05/2022 Per oncology: Unable to repeat PET scan due to insurance coverage S/p right mastectomy with axilary lymph node dissection, completed chemotherapy Bone density 2022: osteopenia, current use of fosamax , vit. D and calcium  Last mammogram 2024: normal F/up annually   Reviewed medical, surgical, and social  history today  Medications: Outpatient Medications Prior to Visit  Medication Sig   alendronate  (FOSAMAX ) 70 MG tablet Take 1 tablet (70 mg total) by mouth once a week. Take with a full glass of water on an empty stomach.   amLODipine  (NORVASC ) 5 MG tablet Take 1 tablet (5 mg total) by mouth at bedtime.   anastrozole  (ARIMIDEX ) 1 MG tablet Take 1 mg by mouth daily.   cloNIDine  (CATAPRES ) 0.1 MG tablet Take 1 tablet (0.1 mg total) by mouth 2 (two) times daily.   Continuous Glucose Sensor (FREESTYLE LIBRE 14 DAY SENSOR) MISC 1 Units by Does not apply route every 14 (fourteen) days.   DULoxetine  (CYMBALTA ) 30 MG capsule Take 1 capsule (30 mg total) by mouth daily.   famotidine  (PEPCID ) 20 MG tablet TAKE 1 TABLET BY MOUTH TWICE A DAY   glipiZIDE  (GLUCOTROL  XL) 10 MG 24 hr tablet Take 1 tablet (10 mg total) by mouth daily with breakfast.   insulin  glargine (LANTUS ) 100 UNIT/ML Solostar Pen Inject 20 Units into the skin daily.   Insulin  Pen Needle (PEN NEEDLES) 31G X 6 MM MISC 1 application  by Does not apply route at bedtime.   lisinopril  (ZESTRIL ) 40 MG tablet Take 1 tablet (40 mg total) by mouth daily.   metFORMIN  (GLUCOPHAGE ) 850 MG tablet Take 1 tablet (850 mg total) by mouth 2 (two) times daily with a meal.   metoprolol  succinate (TOPROL -XL) 100 MG 24 hr tablet Take 1 tablet (100 mg total) by mouth daily.   pravastatin  (  PRAVACHOL ) 20 MG tablet Take 1 tablet (20 mg total) by mouth daily.   Vitamin D , Ergocalciferol , (DRISDOL ) 1.25 MG (50000 UNIT) CAPS capsule TAKE 1 CAPSULE ONCE A WEEK FOR 12 WEEKS. AFTER 12 WEEKS SWITCH TO OVER THE COUNTER VITAMIN D  2000 IU   [DISCONTINUED] Semaglutide ,0.25 or 0.5MG /DOS, (OZEMPIC , 0.25 OR 0.5 MG/DOSE,) 2 MG/1.5ML SOPN Inject 0.25 mg into the skin once a week.   No facility-administered medications prior to visit.   Reviewed past medical and social history.   ROS per HPI above      Objective:  BP (!) 172/110 (BP Location: Right Arm, Patient Position:  Sitting, Cuff Size: Large) Comment: recheck done manual  Pulse 91   Temp 98 F (36.7 C) (Temporal)   Resp 18   Wt 264 lb 3.2 oz (119.8 kg)   SpO2 98%   BMI 43.97 kg/m      Physical Exam Vitals and nursing note reviewed. Exam conducted with a chaperone present.  Constitutional:      Appearance: She is obese.  Cardiovascular:     Rate and Rhythm: Normal rate.     Pulses: Normal pulses.  Pulmonary:     Effort: Pulmonary effort is normal.  Genitourinary:    General: Normal vulva.     Pubic Area: No rash.      Labia:        Right: No rash or tenderness.        Left: No rash or tenderness.   Skin:    Findings: No rash.  Neurological:     Mental Status: She is alert and oriented to person, place, and time.     Results for orders placed or performed in visit on 04/03/23  HM MAMMOGRAPHY  Result Value Ref Range   HM Mammogram 0-4 Bi-Rad 0-4 Bi-Rad, Self Reported Normal  HM MAMMOGRAPHY  Result Value Ref Range   HM Mammogram 0-4 Bi-Rad 0-4 Bi-Rad, Self Reported Normal      Assessment & Plan:    Problem List Items Addressed This Visit     Breast cancer, stage 3 (HCC)   Current use of anastrozole  Under the care of [facility] oncology, Last OV 05/2022 Per oncology: Unable to repeat PET scan due to insurance coverage S/p right mastectomy with axilary lymph node dissection, completed chemotherapy Bone density 2022: osteopenia, current use of fosamax , vit. D and calcium  Last mammogram 2024: normal F/up annually      DM (diabetes mellitus) (HCC)   She has not used ozempic  or lantus  or glipizide  x 3months due to dispensing pharmacy discrepancy. Insurance will not cover meds dispensed by [facility] Current use of glipizide  only Today she reports generalized itching including vaginal itching. No discharge or odor or pelvic pain or dysuria or frequency.  No rash noted Advised to send name of right pharmacy via mychart ASAP Advised to use benadryl tab and monistat vaginal  cream Repeat hgbA1c at [facility] Last CMP 01/2023: random glucose at 422 Will hold ozempic  Maintain other med doses      Relevant Orders   Hemoglobin A1c   Microalbumin / creatinine urine ratio   Essential hypertension - Primary   She has been out of lisinopril  and amlodipine  x 3months due to discrepancy in dispensing pharmacy. Her insurance will not cover meds dispensed by [facility] Current use of clonidine  and metoprolol  only BP Readings from Last 3 Encounters:  04/03/23 (!) 140/100  01/29/23 (!) 168/110  10/29/22 (!) 150/100    Advised to sent name of right  pharmacy via mychart ASAP F/up in 62month      Hyperlipidemia associated with type 2 diabetes mellitus (HCC)   Has not been on pravastatin  due to pharmacy discrepancy.  Advised to send name of right pharmacy via mychart ASAP.      Relevant Orders   Direct LDL   Morbid obesity (HCC)   Other Visit Diagnoses       Itching in the vaginal area          Return in about 4 weeks (around 05/01/2023) for HTN, DM.     Roselie Mood, NP

## 2023-04-04 MED ORDER — CLONIDINE HCL 0.1 MG PO TABS: 0.1000 mg | ORAL_TABLET | Freq: Two times a day (BID) | ORAL | 3 refills | Status: DC

## 2023-04-04 MED ORDER — PEN NEEDLES 31G X 6 MM MISC
1.0000 | Freq: Every day | 3 refills | Status: AC
Start: 2023-04-04 — End: ?

## 2023-04-04 MED ORDER — VITAMIN D (ERGOCALCIFEROL) 1.25 MG (50000 UNIT) PO CAPS: ORAL_CAPSULE | ORAL | 0 refills | Status: AC

## 2023-04-04 MED ORDER — GLIPIZIDE ER 10 MG PO TB24: 10.0000 mg | ORAL_TABLET | Freq: Every day | ORAL | 3 refills | Status: DC

## 2023-04-04 MED ORDER — AMLODIPINE BESYLATE 5 MG PO TABS: 5.0000 mg | ORAL_TABLET | Freq: Every day | ORAL | 3 refills | Status: DC

## 2023-04-04 MED ORDER — METFORMIN HCL 850 MG PO TABS: 850.0000 mg | ORAL_TABLET | Freq: Two times a day (BID) | ORAL | 1 refills | Status: DC

## 2023-04-04 MED ORDER — DULOXETINE HCL 30 MG PO CPEP: 30.0000 mg | ORAL_CAPSULE | Freq: Every day | ORAL | 3 refills | Status: DC

## 2023-04-04 MED ORDER — INSULIN GLARGINE 100 UNIT/ML SOLOSTAR PEN: 20.0000 [IU] | PEN_INJECTOR | Freq: Every day | SUBCUTANEOUS | 2 refills | Status: DC

## 2023-04-04 MED ORDER — ALENDRONATE SODIUM 70 MG PO TABS: 70.0000 mg | ORAL_TABLET | ORAL | 3 refills | Status: DC

## 2023-04-04 MED ORDER — LISINOPRIL 40 MG PO TABS: 40.0000 mg | ORAL_TABLET | Freq: Every day | ORAL | 3 refills | Status: DC

## 2023-04-04 MED ORDER — PRAVASTATIN SODIUM 20 MG PO TABS: 20.0000 mg | ORAL_TABLET | Freq: Every day | ORAL | 3 refills | Status: DC

## 2023-04-04 MED ORDER — METOPROLOL SUCCINATE ER 100 MG PO TB24: 100.0000 mg | ORAL_TABLET | Freq: Every day | ORAL | 3 refills | Status: DC

## 2023-04-08 NOTE — Telephone Encounter (Signed)
 Copied from CRM [id number]. Topic: Clinical - Prescription Issue >> Apr 08, 2023 11:44 AM Corin V wrote: Reason for CRM: Marty from [facility] pharmacy called and requested the metformin  and lantis Rx be resent in as a 90 day script. She also requested a call back to get clarification on the directions for the insulin  pen needles as they are not clear on frequency of use the way theyare written.

## 2023-04-09 NOTE — Telephone Encounter (Signed)
 Spoke to [employer] regarding medication questions and supplies. All concerns and questions were answered.

## 2023-05-05 ENCOUNTER — Ambulatory Visit: Payer: BC Managed Care – PPO | Admitting: Nurse Practitioner

## 2023-05-09 ENCOUNTER — Other Ambulatory Visit (HOSPITAL_BASED_OUTPATIENT_CLINIC_OR_DEPARTMENT_OTHER): Payer: Self-pay

## 2023-05-12 DIAGNOSIS — C50912 Malignant neoplasm of unspecified site of left female breast: Secondary | ICD-10-CM | POA: Diagnosis not present

## 2023-05-12 DIAGNOSIS — C50919 Malignant neoplasm of unspecified site of unspecified female breast: Secondary | ICD-10-CM | POA: Diagnosis not present

## 2023-05-12 DIAGNOSIS — I890 Lymphedema, not elsewhere classified: Secondary | ICD-10-CM | POA: Diagnosis not present

## 2023-05-16 ENCOUNTER — Ambulatory Visit: Payer: BC Managed Care – PPO | Admitting: Nurse Practitioner

## 2023-05-16 ENCOUNTER — Encounter: Payer: Self-pay | Admitting: Nurse Practitioner

## 2023-05-16 DIAGNOSIS — Z7984 Long term (current) use of oral hypoglycemic drugs: Secondary | ICD-10-CM

## 2023-05-16 DIAGNOSIS — E119 Type 2 diabetes mellitus without complications: Secondary | ICD-10-CM

## 2023-05-16 DIAGNOSIS — I10 Essential (primary) hypertension: Secondary | ICD-10-CM

## 2023-05-16 DIAGNOSIS — Z7985 Long-term (current) use of injectable non-insulin antidiabetic drugs: Secondary | ICD-10-CM | POA: Diagnosis not present

## 2023-05-16 DIAGNOSIS — E1169 Type 2 diabetes mellitus with other specified complication: Secondary | ICD-10-CM | POA: Diagnosis not present

## 2023-05-16 DIAGNOSIS — Z794 Long term (current) use of insulin: Secondary | ICD-10-CM

## 2023-05-16 MED ORDER — INSULIN GLARGINE 100 UNIT/ML SOLOSTAR PEN: 20.0000 [IU] | PEN_INJECTOR | Freq: Every day | SUBCUTANEOUS | 0 refills | Status: DC

## 2023-05-16 MED ORDER — AMLODIPINE BESYLATE 10 MG PO TABS: 10.0000 mg | ORAL_TABLET | Freq: Every day | ORAL | 1 refills | Status: DC

## 2023-05-16 NOTE — Assessment & Plan Note (Signed)
She was able to get meds as prescribed. BP improving but not at goal. BP Readings from Last 3 Encounters:  05/16/23 (!) 140/100  04/03/23 (!) 172/110  01/29/23 (!) 168/110    Increase amlodipine dose to 10mg  in PM Maintain other med doses F/up in 24month

## 2023-05-16 NOTE — Progress Notes (Signed)
Established Patient Visit  Patient: Melissa Bray   DOB: 06-13-1960   63 y.o. Female  MRN: [id number] Visit Date: 05/16/2023  Subjective:   No chief complaint on file.  HPI DM (diabetes mellitus) (HCC) hgbA1c of 13% uncontrolled due to lack of medication Home glucose: 200s Current use of metformin and glipizide, but did not get lantus from [facility] delivery pharmacy. Talked to [facility] with [facility] today at 11:38am. She could not explain why lantus was not delivered to patient with other medications. She stated med will be sent via fedex on Monday and patient will get med on Tuesday. I informed her that Ms. Macha's glucose is elevated due to lack of medication. Tresa Endo stated [employer] does not delivery on weekend so, earliest delivery date will be Tuesday. Informed Ms. Sando about about conversation and expected med delivery date. She agreed to get 1 lantus pen from the local retail pharmacy while waiting for meds fro [facility].  She has appointment for eye exam in April F/up in 64month  Essential hypertension She was able to get meds as prescribed. BP improving but not at goal. BP Readings from Last 3 Encounters:  05/16/23 (!) 140/100  04/03/23 (!) 172/110  01/29/23 (!) 168/110    Increase amlodipine dose to 10mg  in PM Maintain other med doses F/up in 64month  Reviewed medical, surgical, and social history today  Medications: Outpatient Medications Prior to Visit  Medication Sig   alendronate (FOSAMAX) 70 MG tablet Take 1 tablet (70 mg total) by mouth once a week. Take with a full glass of water on an empty stomach.   anastrozole (ARIMIDEX) 1 MG tablet Take 1 mg by mouth daily.   cloNIDine (CATAPRES) 0.1 MG tablet Take 1 tablet (0.1 mg total) by mouth 2 (two) times daily.   DULoxetine (CYMBALTA) 30 MG capsule Take 1 capsule (30 mg total) by mouth daily.   famotidine (PEPCID) 20 MG tablet TAKE 1 TABLET BY MOUTH TWICE A DAY   glipiZIDE (GLUCOTROL XL) 10  MG 24 hr tablet Take 1 tablet (10 mg total) by mouth daily with breakfast.   Insulin Pen Needle (PEN NEEDLES) 31G X 6 MM MISC 1 application  by Does not apply route at bedtime.   lisinopril (ZESTRIL) 40 MG tablet Take 1 tablet (40 mg total) by mouth daily.   metFORMIN (GLUCOPHAGE) 850 MG tablet Take 1 tablet (850 mg total) by mouth 2 (two) times daily with a meal.   metoprolol succinate (TOPROL-XL) 100 MG 24 hr tablet Take 1 tablet (100 mg total) by mouth daily.   pravastatin (PRAVACHOL) 20 MG tablet Take 1 tablet (20 mg total) by mouth daily.   Vitamin D, Ergocalciferol, (DRISDOL) 1.25 MG (50000 UNIT) CAPS capsule TAKE 1 CAPSULE ONCE A WEEK FOR 12 WEEKS. AFTER 12 WEEKS SWITCH TO OVER THE COUNTER VITAMIN D 2000 IU   [DISCONTINUED] amLODipine (NORVASC) 5 MG tablet Take 1 tablet (5 mg total) by mouth at bedtime.   Continuous Glucose Sensor (FREESTYLE LIBRE 14 DAY SENSOR) MISC 1 Units by Does not apply route every 14 (fourteen) days. (Patient not taking: Reported on 05/16/2023)   [DISCONTINUED] insulin glargine (LANTUS) 100 UNIT/ML Solostar Pen Inject 20 Units into the skin daily. (Patient not taking: Reported on 05/16/2023)   No facility-administered medications prior to visit.   Reviewed past medical and social history.   ROS per HPI above      Objective:  BP (!) 140/100 (BP Location: Right Arm, Patient Position: Sitting, Cuff Size: Normal)   Pulse 77   Temp 97.7 F (36.5 C)   Ht 5\' 5"  (1.651 m)   Wt 263 lb 12.8 oz (119.7 kg)   SpO2 98%   BMI 43.90 kg/m      Physical Exam Vitals and nursing note reviewed.  Cardiovascular:     Rate and Rhythm: Normal rate.     Pulses: Normal pulses.  Pulmonary:     Effort: Pulmonary effort is normal.  Musculoskeletal:     Right lower leg: No edema.     Left lower leg: No edema.  Neurological:     Mental Status: She is alert.     No results found for any visits on 05/16/23.    Assessment & Plan:    Problem List Items Addressed This Visit      DM (diabetes mellitus) (HCC)   hgbA1c of 13% uncontrolled due to lack of medication Home glucose: 200s Current use of metformin and glipizide, but did not get lantus from [facility] delivery pharmacy. Talked to [facility] with [facility] today at 11:38am. She could not explain why lantus was not delivered to patient with other medications. She stated med will be sent via fedex on Monday and patient will get med on Tuesday. I informed her that Ms. Fendrick's glucose is elevated due to lack of medication. Tresa Endo stated [employer] does not delivery on weekend so, earliest delivery date will be Tuesday. Informed Ms. Kriegel about about conversation and expected med delivery date. She agreed to get 1 lantus pen from the local retail pharmacy while waiting for meds fro [facility].  She has appointment for eye exam in April F/up in 525month      Relevant Medications   insulin glargine (LANTUS) 100 UNIT/ML Solostar Pen   Essential hypertension   She was able to get meds as prescribed. BP improving but not at goal. BP Readings from Last 3 Encounters:  05/16/23 (!) 140/100  04/03/23 (!) 172/110  01/29/23 (!) 168/110    Increase amlodipine dose to 10mg  in PM Maintain other med doses F/up in 525month      Relevant Medications   amLODipine (NORVASC) 10 MG tablet   Long-term (current) use of injectable non-insulin antidiabetic drugs   Relevant Medications   insulin glargine (LANTUS) 100 UNIT/ML Solostar Pen   Other Visit Diagnoses       Diabetes mellitus treated with insulin and oral medication (HCC)       Relevant Medications   insulin glargine (LANTUS) 100 UNIT/ML Solostar Pen      Return in about 4 weeks (around 06/13/2023) for DM, HTN.     Alysia Penna, NP

## 2023-05-16 NOTE — Assessment & Plan Note (Addendum)
hgbA1c of 13% uncontrolled due to lack of medication Home glucose: 200s Current use of metformin and glipizide, but did not get lantus from [facility] delivery pharmacy. Talked to [facility] with [facility] today at 11:38am. She could not explain why lantus was not delivered to patient with other medications. She stated med will be sent via fedex on Monday and patient will get med on Tuesday. I informed her that Ms. Conaway's glucose is elevated due to lack of medication. Tresa Endo stated [employer] does not delivery on weekend so, earliest delivery date will be Tuesday. Informed Ms. Hentges about about conversation and expected med delivery date. She agreed to get 1 lantus pen from the local retail pharmacy while waiting for meds fro [facility].  She has appointment for eye exam in April F/up in 69month

## 2023-05-16 NOTE — Patient Instructions (Addendum)
Increase amlodipine dose to 10mg  in PM Maintain other med doses Call this office wor send a mychar message if you do not get your insulin by Tuesday

## 2023-05-27 DIAGNOSIS — I890 Lymphedema, not elsewhere classified: Secondary | ICD-10-CM | POA: Insufficient documentation

## 2023-05-27 DIAGNOSIS — C50912 Malignant neoplasm of unspecified site of left female breast: Secondary | ICD-10-CM | POA: Diagnosis not present

## 2023-05-28 ENCOUNTER — Other Ambulatory Visit: Payer: Self-pay | Admitting: Nurse Practitioner

## 2023-05-28 DIAGNOSIS — E1169 Type 2 diabetes mellitus with other specified complication: Secondary | ICD-10-CM

## 2023-05-28 MED ORDER — METFORMIN HCL 850 MG PO TABS: 850.0000 mg | ORAL_TABLET | Freq: Two times a day (BID) | ORAL | 0 refills | Status: DC

## 2023-05-28 NOTE — Telephone Encounter (Signed)
 Copied from CRM [id number]. Topic: Clinical - Medication Refill >> May 28, 2023  2:59 PM Presley Raddle C wrote: Most Recent Primary Care Visit:  Provider: Anne Ng  Department: [facility]  Visit Type: OFFICE VISIT  Date: 05/16/2023  Medication: metformin  Has the patient contacted their pharmacy? No No more refills  Is this the correct pharmacy for this prescription? Yes  [facility] [place], [place] - [address] [place] [address] Phone: [phone] Fax: [phone]   Has the prescription been filled recently? Yes  Is the patient out of the medication? Yes  Has the patient been seen for an appointment in the last year OR does the patient have an upcoming appointment? Yes  Can we respond through MyChart? No  Agent: Please be advised that Rx refills may take up to 3 business days. We ask that you follow-up with your pharmacy.

## 2023-06-03 DIAGNOSIS — I890 Lymphedema, not elsewhere classified: Secondary | ICD-10-CM | POA: Diagnosis not present

## 2023-06-03 DIAGNOSIS — C50912 Malignant neoplasm of unspecified site of left female breast: Secondary | ICD-10-CM | POA: Diagnosis not present

## 2023-06-06 ENCOUNTER — Other Ambulatory Visit: Payer: Self-pay | Admitting: Nurse Practitioner

## 2023-06-06 DIAGNOSIS — E559 Vitamin D deficiency, unspecified: Secondary | ICD-10-CM

## 2023-06-10 DIAGNOSIS — I890 Lymphedema, not elsewhere classified: Secondary | ICD-10-CM | POA: Diagnosis not present

## 2023-06-10 DIAGNOSIS — C50912 Malignant neoplasm of unspecified site of left female breast: Secondary | ICD-10-CM | POA: Diagnosis not present

## 2023-06-11 ENCOUNTER — Ambulatory Visit: Payer: BC Managed Care – PPO | Admitting: Nurse Practitioner

## 2023-06-12 DIAGNOSIS — C50912 Malignant neoplasm of unspecified site of left female breast: Secondary | ICD-10-CM | POA: Diagnosis not present

## 2023-06-12 DIAGNOSIS — I890 Lymphedema, not elsewhere classified: Secondary | ICD-10-CM | POA: Diagnosis not present

## 2023-06-12 NOTE — Telephone Encounter (Signed)
 Called patient to ask if she has completed the vitamin d 81191 units weekly for 12 weeks because our office received a refill request on her behalf requesting a refill. Patient stated that she has 2 more weeks left. I informed her that Claris Gower wanted her to swtich to 2000Iu daily of vitamin d once completing the weekly doses for 12 weeks. She asked if I knew if the pharmacy will have that and I informed her that she will have to give her pharmacy a call or to go into her pharmacy to see if it is there. She verbalized understanding and thanked me for calling

## 2023-06-14 ENCOUNTER — Other Ambulatory Visit: Payer: Self-pay | Admitting: Nurse Practitioner

## 2023-06-14 DIAGNOSIS — E559 Vitamin D deficiency, unspecified: Secondary | ICD-10-CM

## 2023-06-16 DIAGNOSIS — C50912 Malignant neoplasm of unspecified site of left female breast: Secondary | ICD-10-CM | POA: Diagnosis not present

## 2023-06-16 DIAGNOSIS — I890 Lymphedema, not elsewhere classified: Secondary | ICD-10-CM | POA: Diagnosis not present

## 2023-06-17 DIAGNOSIS — C50912 Malignant neoplasm of unspecified site of left female breast: Secondary | ICD-10-CM | POA: Diagnosis not present

## 2023-06-17 DIAGNOSIS — I890 Lymphedema, not elsewhere classified: Secondary | ICD-10-CM | POA: Diagnosis not present

## 2023-06-26 ENCOUNTER — Telehealth: Payer: Self-pay | Admitting: Nurse Practitioner

## 2023-06-26 NOTE — Telephone Encounter (Signed)
error 

## 2023-06-27 ENCOUNTER — Encounter: Payer: Self-pay | Admitting: Nurse Practitioner

## 2023-06-27 ENCOUNTER — Ambulatory Visit: Admitting: Nurse Practitioner

## 2023-06-27 ENCOUNTER — Other Ambulatory Visit: Payer: Self-pay | Admitting: Nurse Practitioner

## 2023-06-27 VITALS — BP 138/88 | HR 84 | Temp 97.9°F | Resp 18 | Wt 261.8 lb

## 2023-06-27 DIAGNOSIS — I10 Essential (primary) hypertension: Secondary | ICD-10-CM

## 2023-06-27 DIAGNOSIS — Z7984 Long term (current) use of oral hypoglycemic drugs: Secondary | ICD-10-CM | POA: Diagnosis not present

## 2023-06-27 DIAGNOSIS — E1169 Type 2 diabetes mellitus with other specified complication: Secondary | ICD-10-CM

## 2023-06-27 DIAGNOSIS — Z794 Long term (current) use of insulin: Secondary | ICD-10-CM | POA: Diagnosis not present

## 2023-06-27 DIAGNOSIS — Z23 Encounter for immunization: Secondary | ICD-10-CM | POA: Diagnosis not present

## 2023-06-27 DIAGNOSIS — E119 Type 2 diabetes mellitus without complications: Secondary | ICD-10-CM

## 2023-06-27 MED ORDER — FREESTYLE LIBRE 3 READER DEVI
1.0000 [IU] | Freq: Once | 0 refills | Status: AC
Start: 2023-06-27 — End: 2023-06-27

## 2023-06-27 MED ORDER — FREESTYLE LIBRE 3 PLUS SENSOR MISC: 1.0000 [IU] | 11 refills | Status: DC

## 2023-06-27 MED ORDER — EMPAGLIFLOZIN 10 MG PO TABS: 10.0000 mg | ORAL_TABLET | Freq: Every morning | ORAL | 1 refills | Status: DC

## 2023-06-27 NOTE — Patient Instructions (Signed)
 Start jardiance 10mg  daily Maintain other med doses Continue to monitor glucose readings and record Send glucose reading every Friday. Schedule lab appointment  Diabetes Mellitus and Nutrition, Adult When you have diabetes, or diabetes mellitus, it is very important to have healthy eating habits because your blood sugar (glucose) levels are greatly affected by what you eat and drink. Eating healthy foods in the right amounts, at about the same times every day, can help you: Manage your blood glucose. Lower your risk of heart disease. Improve your blood pressure. Reach or maintain a healthy weight. What can affect my meal plan? Every person with diabetes is different, and each person has different needs for a meal plan. Your health care provider may recommend that you work with a dietitian to make a meal plan that is best for you. Your meal plan may vary depending on factors such as: The calories you need. The medicines you take. Your weight. Your blood glucose, blood pressure, and cholesterol levels. Your activity level. Other health conditions you have, such as heart or kidney disease. How do carbohydrates affect me? Carbohydrates, also called carbs, affect your blood glucose level more than any other type of food. Eating carbs raises the amount of glucose in your blood. It is important to know how many carbs you can safely have in each meal. This is different for every person. Your dietitian can help you calculate how many carbs you should have at each meal and for each snack. How does alcohol affect me? Alcohol can cause a decrease in blood glucose (hypoglycemia), especially if you use insulin or take certain diabetes medicines by mouth. Hypoglycemia can be a life-threatening condition. Symptoms of hypoglycemia, such as sleepiness, dizziness, and confusion, are similar to symptoms of having too much alcohol. Do not drink alcohol if: Your health care provider tells you not to drink. You  are pregnant, may be pregnant, or are planning to become pregnant. If you drink alcohol: Limit how much you have to: 0-1 drink a day for women. 0-2 drinks a day for men. Know how much alcohol is in your drink. In the [place] one drink equals one 12 oz bottle of beer (355 mL), one 5 oz glass of wine (148 mL), or one 1 oz glass of hard liquor (44 mL). Keep yourself hydrated with water, diet soda, or unsweetened iced tea. Keep in mind that regular soda, juice, and other mixers may contain a lot of sugar and must be counted as carbs. What are tips for following this plan?  Reading food labels Start by checking the serving size on the Nutrition Facts label of packaged foods and drinks. The number of calories and the amount of carbs, fats, and other nutrients listed on the label are based on one serving of the item. Many items contain more than one serving per package. Check the total grams (g) of carbs in one serving. Check the number of grams of saturated fats and trans fats in one serving. Choose foods that have a low amount or none of these fats. Check the number of milligrams (mg) of salt (sodium) in one serving. Most people should limit total sodium intake to less than 2,300 mg per day. Always check the nutrition information of foods labeled as "low-fat" or "nonfat." These foods may be higher in added sugar or refined carbs and should be avoided. Talk to your dietitian to identify your daily goals for nutrients listed on the label. Shopping Avoid buying canned, pre-made, or processed foods. These  foods tend to be high in fat, sodium, and added sugar. Shop around the outside edge of the grocery store. This is where you will most often find fresh fruits and vegetables, bulk grains, fresh meats, and fresh dairy products. Cooking Use low-heat cooking methods, such as baking, instead of high-heat cooking methods, such as deep frying. Cook using healthy oils, such as olive, canola, or sunflower  oil. Avoid cooking with butter, cream, or high-fat meats. Meal planning Eat meals and snacks regularly, preferably at the same times every day. Avoid going long periods of time without eating. Eat foods that are high in fiber, such as fresh fruits, vegetables, beans, and whole grains. Eat 4-6 oz (112-168 g) of lean protein each day, such as lean meat, chicken, fish, eggs, or tofu. One ounce (oz) (28 g) of lean protein is equal to: 1 oz (28 g) of meat, chicken, or fish. 1 egg.  cup (62 g) of tofu. Eat some foods each day that contain healthy fats, such as avocado, nuts, seeds, and fish. What foods should I eat? Fruits Berries. Apples. Oranges. Peaches. Apricots. Plums. Grapes. Mangoes. Papayas. Pomegranates. Kiwi. Cherries. Vegetables Leafy greens, including lettuce, spinach, kale, chard, collard greens, mustard greens, and cabbage. Beets. Cauliflower. Broccoli. Carrots. Green beans. Tomatoes. Peppers. Onions. Cucumbers. Brussels sprouts. Grains Whole grains, such as whole-wheat or whole-grain bread, crackers, tortillas, cereal, and pasta. Unsweetened oatmeal. Quinoa. Brown or wild rice. Meats and other proteins Seafood. Poultry without skin. Lean cuts of poultry and beef. Tofu. Nuts. Seeds. Dairy Low-fat or fat-free dairy products such as milk, yogurt, and cheese. The items listed above may not be a complete list of foods and beverages you can eat and drink. Contact a dietitian for more information. What foods should I avoid? Fruits Fruits canned with syrup. Vegetables Canned vegetables. Frozen vegetables with butter or cream sauce. Grains Refined white flour and flour products such as bread, pasta, snack foods, and cereals. Avoid all processed foods. Meats and other proteins Fatty cuts of meat. Poultry with skin. Breaded or fried meats. Processed meat. Avoid saturated fats. Dairy Full-fat yogurt, cheese, or milk. Beverages Sweetened drinks, such as soda or iced tea. The items  listed above may not be a complete list of foods and beverages you should avoid. Contact a dietitian for more information. Questions to ask a health care provider Do I need to meet with a certified diabetes care and education specialist? Do I need to meet with a dietitian? What number can I call if I have questions? When are the best times to check my blood glucose? Where to find more information: American Diabetes Association: diabetes.org Academy of Nutrition and Dietetics: eatright.[employer] of Diabetes and Digestive and Kidney Diseases: StageSync.si Association of Diabetes Care & Education Specialists: diabeteseducator.org Summary It is important to have healthy eating habits because your blood sugar (glucose) levels are greatly affected by what you eat and drink. It is important to use alcohol carefully. A healthy meal plan will help you manage your blood glucose and lower your risk of heart disease. Your health care provider may recommend that you work with a dietitian to make a meal plan that is best for you. This information is not intended to replace advice given to you by your health care provider. Make sure you discuss any questions you have with your health care provider. Document Revised: 10/19/2019 Document Reviewed: 10/20/2019 Elsevier Patient Education  2024 [employer].

## 2023-06-27 NOTE — Progress Notes (Signed)
 Established Patient Visit  Patient: Melissa Bray   DOB: 1961-04-01   63 y.o. Female  MRN: [id number] Visit Date: 06/27/2023  Subjective:    Chief Complaint  Patient presents with   Hypertension    1 month follow up HTN and DM; eye exam is due schedule for next month and Prevnar 20 vaccine.   DM (diabetes mellitus) (HCC) Fasting Home glucose: 150s- 200s Reporst compliance with lantus 20units, metformin 850mg  BID and glipizide 10mg  calculated UACr 142.31mg /g and 174.96mg /g-current use of ACE-I and pravastatin Wed discussed need for SGLT-2: benefits and potential side effects. She verbalized understanding and agreed to start med. Advised to maintain adequate oral hydration-64oz daily and to call office if any signs of UTI or vaginal yeast infection. She also agreed to [facility] 3 CGM prescription  Jardiance 10mg  sent Maintain other med doses Advised to sent glucose reading via mychart weekly Repeat BMP, hgbA1c and urinalysis in 42month F/up with me in 36months  Essential hypertension Improved BP Compliant with amlodipine 10mg , lisinopril 40mg , and metoprolol 100mg  BP Readings from Last 3 Encounters:  06/27/23 138/88  05/16/23 (!) 140/100  04/03/23 (!) 172/110    Maintain other med doses Advised to maintain DASH diet and daily exercise F/up in 36month  Wt Readings from Last 3 Encounters:  06/27/23 261 lb 12.8 oz (118.8 kg)  05/16/23 263 lb 12.8 oz (119.7 kg)  04/03/23 264 lb 3.2 oz (119.8 kg)    Reviewed medical, surgical, and social history today  Medications: Outpatient Medications Prior to Visit  Medication Sig   alendronate (FOSAMAX) 70 MG tablet Take 1 tablet (70 mg total) by mouth once a week. Take with a full glass of water on an empty stomach.   amLODipine (NORVASC) 10 MG tablet Take 1 tablet (10 mg total) by mouth at bedtime.   anastrozole (ARIMIDEX) 1 MG tablet Take 1 mg by mouth daily.   cloNIDine (CATAPRES) 0.1 MG tablet Take 1 tablet (0.1 mg total) by  mouth 2 (two) times daily.   DULoxetine (CYMBALTA) 30 MG capsule Take 1 capsule (30 mg total) by mouth daily.   glipiZIDE (GLUCOTROL XL) 10 MG 24 hr tablet Take 1 tablet (10 mg total) by mouth daily with breakfast.   insulin glargine (LANTUS) 100 UNIT/ML Solostar Pen Inject 20 Units into the skin daily.   Insulin Pen Needle (PEN NEEDLES) 31G X 6 MM MISC 1 application  by Does not apply route at bedtime.   lisinopril (ZESTRIL) 40 MG tablet Take 1 tablet (40 mg total) by mouth daily.   metFORMIN (GLUCOPHAGE) 850 MG tablet Take 1 tablet (850 mg total) by mouth 2 (two) times daily with a meal.   metoprolol succinate (TOPROL-XL) 100 MG 24 hr tablet Take 1 tablet (100 mg total) by mouth daily.   pravastatin (PRAVACHOL) 20 MG tablet Take 1 tablet (20 mg total) by mouth daily.   Vitamin D, Ergocalciferol, (DRISDOL) 1.25 MG (50000 UNIT) CAPS capsule TAKE 1 CAPSULE ONCE A WEEK FOR 12 WEEKS. AFTER 12 WEEKS SWITCH TO OVER THE COUNTER VITAMIN D 2000 IU   [DISCONTINUED] famotidine (PEPCID) 20 MG tablet TAKE 1 TABLET BY MOUTH TWICE A DAY   [DISCONTINUED] Continuous Glucose Sensor (FREESTYLE LIBRE 14 DAY SENSOR) MISC 1 Units by Does not apply route every 14 (fourteen) days. (Patient not taking: Reported on 06/27/2023)   No facility-administered medications prior to visit.   Reviewed past medical and social history.  ROS per HPI above  Last metabolic panel Lab Results  Component Value Date   GLUCOSE 204 (H) 12/04/2021   NA 134 (A) 02/03/2023   K 4.1 02/03/2023   CL 99 02/03/2023   CO2 28 (A) 02/03/2023   BUN 12 02/03/2023   CREATININE 0.8 02/03/2023   EGFR 82 02/03/2023   CALCIUM 9.6 12/04/2021   PHOS 3.0 12/04/2021   PROT 7.6 09/16/2021   ALBUMIN 3.9 02/03/2023   BILITOT 0.3 09/16/2021   ALKPHOS 83 02/03/2023   AST 23 02/03/2023   ALT 29 02/03/2023   ANIONGAP 6 09/16/2021   Last lipids Lab Results  Component Value Date   CHOL 115 04/23/2022   HDL 50.40 04/23/2022   LDLCALC 50 04/23/2022    LDLDIRECT 97.0 04/03/2023   TRIG 75.0 04/23/2022   CHOLHDL 2 04/23/2022   Last hemoglobin A1c Lab Results  Component Value Date   HGBA1C 13.8 (H) 04/03/2023      Objective:  BP 138/88 (BP Location: Right Arm, Patient Position: Sitting, Cuff Size: Large)   Pulse 84   Temp 97.9 F (36.6 C) (Temporal)   Resp 18   Wt 261 lb 12.8 oz (118.8 kg)   SpO2 99%   BMI 43.57 kg/m      Physical Exam Cardiovascular:     Rate and Rhythm: Normal rate and regular rhythm.     Pulses: Normal pulses.     Heart sounds: Normal heart sounds.  Pulmonary:     Effort: Pulmonary effort is normal.     Breath sounds: Normal breath sounds.  Musculoskeletal:     Right lower leg: No edema.     Left lower leg: No edema.  Neurological:     Mental Status: She is alert and oriented to person, place, and time.     No results found for any visits on 06/27/23.    Assessment & Plan:    Problem List Items Addressed This Visit     DM (diabetes mellitus) (HCC) - Primary   Home glucose: 150s- calculated UACr 142.31mg /g and 174.96mg /g Add jardiance?      Relevant Medications   empagliflozin (JARDIANCE) 10 MG TABS tablet   Continuous Glucose Receiver (FREESTYLE LIBRE 3 READER) DEVI   Continuous Glucose Sensor (FREESTYLE LIBRE 3 PLUS SENSOR) MISC   Other Relevant Orders   Renal Function Panel   Hemoglobin A1c   Urinalysis w microscopic + reflex cultur   Renal Function Panel   Hemoglobin A1c   Urinalysis w microscopic + reflex cultur   Essential hypertension   Relevant Orders   Renal Function Panel   Other Visit Diagnoses       Immunization due       Relevant Orders   Pneumococcal conjugate vaccine 20-valent (Prevnar 20)      Return in about 3 months (around 09/27/2023) for HTN, DM, hyperlipidemia (fasting).     Alysia Penna, NP

## 2023-06-27 NOTE — Assessment & Plan Note (Signed)
 Improved BP Compliant with amlodipine 10mg , lisinopril 40mg , and metoprolol 100mg  BP Readings from Last 3 Encounters:  06/27/23 138/88  05/16/23 (!) 140/100  04/03/23 (!) 172/110    Maintain other med doses Advised to maintain DASH diet and daily exercise F/up in 71month

## 2023-06-27 NOTE — Assessment & Plan Note (Addendum)
 Fasting Home glucose: 150s- 200s Reporst compliance with lantus 20units, metformin 850mg  BID and glipizide 10mg  calculated UACr 142.31mg /g and 174.96mg /g-current use of ACE-I and pravastatin Wed discussed need for SGLT-2: benefits and potential side effects. She verbalized understanding and agreed to start med. Advised to maintain adequate oral hydration-64oz daily and to call office if any signs of UTI or vaginal yeast infection. She also agreed to [facility] 3 CGM prescription  Jardiance 10mg  sent Maintain other med doses Advised to sent glucose reading via mychart weekly Repeat BMP, hgbA1c and urinalysis in 28month F/up with me in 3months

## 2023-07-08 ENCOUNTER — Telehealth: Payer: Self-pay

## 2023-07-08 NOTE — Telephone Encounter (Signed)
 Patient was identified as falling into the [place] Measure - Diabetes.   Patient was: Appointment scheduled for lab or office visit for A1c.  on 07/31/2023    Last office visit 06/27/2023 DM -1 month follow up A1c recheck (around 07/28/2023).

## 2023-07-16 ENCOUNTER — Other Ambulatory Visit (HOSPITAL_COMMUNITY): Payer: Self-pay

## 2023-07-18 ENCOUNTER — Other Ambulatory Visit (HOSPITAL_COMMUNITY): Payer: Self-pay

## 2023-07-18 NOTE — Telephone Encounter (Signed)
 CGM coverage guidelines are for patients using "intensive" insulin  (3+ injections per day). Patient does not meet this criteria.

## 2023-07-21 NOTE — Telephone Encounter (Addendum)
 Sent a MyChart message to patient and tried calling patient to inform of information I could not leave a voice message  per DPR due to voicemail box not yet set up. I will try calling

## 2023-07-31 ENCOUNTER — Other Ambulatory Visit: Payer: Self-pay

## 2023-07-31 ENCOUNTER — Inpatient Hospital Stay (HOSPITAL_BASED_OUTPATIENT_CLINIC_OR_DEPARTMENT_OTHER)
Admission: EM | Admit: 2023-07-31 | Discharge: 2023-08-29 | DRG: 853 | Disposition: A | Discharge status: Rehabilitation Facility | Source: Other Acute Inpatient Hospital | Attending: Thoracic Surgery (Cardiothoracic Vascular Surgery) | Admitting: Thoracic Surgery (Cardiothoracic Vascular Surgery)

## 2023-07-31 ENCOUNTER — Emergency Department (HOSPITAL_BASED_OUTPATIENT_CLINIC_OR_DEPARTMENT_OTHER)

## 2023-07-31 ENCOUNTER — Encounter (HOSPITAL_BASED_OUTPATIENT_CLINIC_OR_DEPARTMENT_OTHER): Payer: Self-pay

## 2023-07-31 ENCOUNTER — Other Ambulatory Visit

## 2023-07-31 DIAGNOSIS — Z9012 Acquired absence of left breast and nipple: Secondary | ICD-10-CM | POA: Diagnosis not present

## 2023-07-31 DIAGNOSIS — K047 Periapical abscess without sinus: Secondary | ICD-10-CM | POA: Diagnosis present

## 2023-07-31 DIAGNOSIS — E785 Hyperlipidemia, unspecified: Secondary | ICD-10-CM | POA: Diagnosis present

## 2023-07-31 DIAGNOSIS — J810 Acute pulmonary edema: Secondary | ICD-10-CM | POA: Diagnosis not present

## 2023-07-31 DIAGNOSIS — M7989 Other specified soft tissue disorders: Secondary | ICD-10-CM | POA: Diagnosis not present

## 2023-07-31 DIAGNOSIS — F411 Generalized anxiety disorder: Secondary | ICD-10-CM | POA: Diagnosis present

## 2023-07-31 DIAGNOSIS — R000 Tachycardia, unspecified: Secondary | ICD-10-CM

## 2023-07-31 DIAGNOSIS — E1169 Type 2 diabetes mellitus with other specified complication: Secondary | ICD-10-CM | POA: Diagnosis present

## 2023-07-31 DIAGNOSIS — R509 Fever, unspecified: Secondary | ICD-10-CM | POA: Diagnosis not present

## 2023-07-31 DIAGNOSIS — K0850 Unsatisfactory restoration of tooth, unspecified: Secondary | ICD-10-CM | POA: Diagnosis not present

## 2023-07-31 DIAGNOSIS — Z7983 Long term (current) use of bisphosphonates: Secondary | ICD-10-CM | POA: Diagnosis not present

## 2023-07-31 DIAGNOSIS — E860 Dehydration: Secondary | ICD-10-CM

## 2023-07-31 DIAGNOSIS — Z794 Long term (current) use of insulin: Secondary | ICD-10-CM

## 2023-07-31 DIAGNOSIS — K029 Dental caries, unspecified: Secondary | ICD-10-CM | POA: Diagnosis present

## 2023-07-31 DIAGNOSIS — B951 Streptococcus, group B, as the cause of diseases classified elsewhere: Secondary | ICD-10-CM | POA: Diagnosis not present

## 2023-07-31 DIAGNOSIS — I340 Nonrheumatic mitral (valve) insufficiency: Secondary | ICD-10-CM | POA: Diagnosis not present

## 2023-07-31 DIAGNOSIS — I483 Typical atrial flutter: Secondary | ICD-10-CM | POA: Diagnosis not present

## 2023-07-31 DIAGNOSIS — G9349 Other encephalopathy: Secondary | ICD-10-CM | POA: Diagnosis not present

## 2023-07-31 DIAGNOSIS — A401 Sepsis due to streptococcus, group B: Principal | ICD-10-CM | POA: Diagnosis present

## 2023-07-31 DIAGNOSIS — E8729 Other acidosis: Secondary | ICD-10-CM | POA: Diagnosis not present

## 2023-07-31 DIAGNOSIS — Z471 Aftercare following joint replacement surgery: Secondary | ICD-10-CM | POA: Diagnosis not present

## 2023-07-31 DIAGNOSIS — J9811 Atelectasis: Secondary | ICD-10-CM | POA: Diagnosis not present

## 2023-07-31 DIAGNOSIS — E66813 Obesity, class 3: Secondary | ICD-10-CM | POA: Insufficient documentation

## 2023-07-31 DIAGNOSIS — I38 Endocarditis, valve unspecified: Secondary | ICD-10-CM | POA: Diagnosis not present

## 2023-07-31 DIAGNOSIS — R6520 Severe sepsis without septic shock: Secondary | ICD-10-CM | POA: Diagnosis not present

## 2023-07-31 DIAGNOSIS — R6510 Systemic inflammatory response syndrome (SIRS) of non-infectious origin without acute organ dysfunction: Secondary | ICD-10-CM | POA: Insufficient documentation

## 2023-07-31 DIAGNOSIS — Z8249 Family history of ischemic heart disease and other diseases of the circulatory system: Secondary | ICD-10-CM | POA: Diagnosis not present

## 2023-07-31 DIAGNOSIS — E871 Hypo-osmolality and hyponatremia: Secondary | ICD-10-CM | POA: Diagnosis not present

## 2023-07-31 DIAGNOSIS — I10 Essential (primary) hypertension: Secondary | ICD-10-CM | POA: Diagnosis present

## 2023-07-31 DIAGNOSIS — I4892 Unspecified atrial flutter: Secondary | ICD-10-CM | POA: Diagnosis not present

## 2023-07-31 DIAGNOSIS — I517 Cardiomegaly: Secondary | ICD-10-CM | POA: Diagnosis not present

## 2023-07-31 DIAGNOSIS — I330 Acute and subacute infective endocarditis: Secondary | ICD-10-CM

## 2023-07-31 DIAGNOSIS — Z825 Family history of asthma and other chronic lower respiratory diseases: Secondary | ICD-10-CM

## 2023-07-31 DIAGNOSIS — R4589 Other symptoms and signs involving emotional state: Secondary | ICD-10-CM | POA: Diagnosis not present

## 2023-07-31 DIAGNOSIS — I110 Hypertensive heart disease with heart failure: Secondary | ICD-10-CM | POA: Diagnosis not present

## 2023-07-31 DIAGNOSIS — A419 Sepsis, unspecified organism: Secondary | ICD-10-CM | POA: Diagnosis not present

## 2023-07-31 DIAGNOSIS — K5900 Constipation, unspecified: Secondary | ICD-10-CM | POA: Diagnosis present

## 2023-07-31 DIAGNOSIS — G9340 Encephalopathy, unspecified: Secondary | ICD-10-CM | POA: Diagnosis not present

## 2023-07-31 DIAGNOSIS — Z79811 Long term (current) use of aromatase inhibitors: Secondary | ICD-10-CM | POA: Diagnosis not present

## 2023-07-31 DIAGNOSIS — R918 Other nonspecific abnormal finding of lung field: Secondary | ICD-10-CM | POA: Diagnosis not present

## 2023-07-31 DIAGNOSIS — M810 Age-related osteoporosis without current pathological fracture: Secondary | ICD-10-CM | POA: Diagnosis present

## 2023-07-31 DIAGNOSIS — E119 Type 2 diabetes mellitus without complications: Secondary | ICD-10-CM | POA: Diagnosis not present

## 2023-07-31 DIAGNOSIS — J90 Pleural effusion, not elsewhere classified: Secondary | ICD-10-CM | POA: Diagnosis not present

## 2023-07-31 DIAGNOSIS — E440 Moderate protein-calorie malnutrition: Secondary | ICD-10-CM | POA: Diagnosis not present

## 2023-07-31 DIAGNOSIS — D696 Thrombocytopenia, unspecified: Secondary | ICD-10-CM | POA: Diagnosis present

## 2023-07-31 DIAGNOSIS — E1165 Type 2 diabetes mellitus with hyperglycemia: Secondary | ICD-10-CM | POA: Diagnosis present

## 2023-07-31 DIAGNOSIS — E782 Mixed hyperlipidemia: Secondary | ICD-10-CM | POA: Diagnosis not present

## 2023-07-31 DIAGNOSIS — Z79899 Other long term (current) drug therapy: Secondary | ICD-10-CM | POA: Diagnosis not present

## 2023-07-31 DIAGNOSIS — I5033 Acute on chronic diastolic (congestive) heart failure: Secondary | ICD-10-CM | POA: Diagnosis not present

## 2023-07-31 DIAGNOSIS — R9389 Abnormal findings on diagnostic imaging of other specified body structures: Secondary | ICD-10-CM | POA: Diagnosis not present

## 2023-07-31 DIAGNOSIS — Z90710 Acquired absence of both cervix and uterus: Secondary | ICD-10-CM

## 2023-07-31 DIAGNOSIS — R579 Shock, unspecified: Secondary | ICD-10-CM | POA: Diagnosis not present

## 2023-07-31 DIAGNOSIS — Z6841 Body Mass Index (BMI) 40.0 and over, adult: Secondary | ICD-10-CM

## 2023-07-31 DIAGNOSIS — J811 Chronic pulmonary edema: Secondary | ICD-10-CM | POA: Diagnosis not present

## 2023-07-31 DIAGNOSIS — Z833 Family history of diabetes mellitus: Secondary | ICD-10-CM | POA: Diagnosis not present

## 2023-07-31 DIAGNOSIS — L89312 Pressure ulcer of right buttock, stage 2: Secondary | ICD-10-CM | POA: Diagnosis not present

## 2023-07-31 DIAGNOSIS — E876 Hypokalemia: Secondary | ICD-10-CM

## 2023-07-31 DIAGNOSIS — R0989 Other specified symptoms and signs involving the circulatory and respiratory systems: Secondary | ICD-10-CM | POA: Diagnosis not present

## 2023-07-31 DIAGNOSIS — Z01810 Encounter for preprocedural cardiovascular examination: Secondary | ICD-10-CM | POA: Diagnosis not present

## 2023-07-31 DIAGNOSIS — R5381 Other malaise: Secondary | ICD-10-CM

## 2023-07-31 DIAGNOSIS — Z7901 Long term (current) use of anticoagulants: Secondary | ICD-10-CM | POA: Diagnosis not present

## 2023-07-31 DIAGNOSIS — E872 Acidosis, unspecified: Secondary | ICD-10-CM | POA: Diagnosis not present

## 2023-07-31 DIAGNOSIS — I272 Pulmonary hypertension, unspecified: Secondary | ICD-10-CM | POA: Diagnosis not present

## 2023-07-31 DIAGNOSIS — T8131XA Disruption of external operation (surgical) wound, not elsewhere classified, initial encounter: Secondary | ICD-10-CM | POA: Diagnosis not present

## 2023-07-31 DIAGNOSIS — R570 Cardiogenic shock: Secondary | ICD-10-CM | POA: Diagnosis not present

## 2023-07-31 DIAGNOSIS — I339 Acute and subacute endocarditis, unspecified: Secondary | ICD-10-CM | POA: Diagnosis not present

## 2023-07-31 DIAGNOSIS — R531 Weakness: Secondary | ICD-10-CM

## 2023-07-31 DIAGNOSIS — Z1152 Encounter for screening for COVID-19: Secondary | ICD-10-CM | POA: Diagnosis not present

## 2023-07-31 DIAGNOSIS — I5021 Acute systolic (congestive) heart failure: Secondary | ICD-10-CM | POA: Diagnosis not present

## 2023-07-31 DIAGNOSIS — N179 Acute kidney failure, unspecified: Secondary | ICD-10-CM | POA: Diagnosis not present

## 2023-07-31 DIAGNOSIS — L03114 Cellulitis of left upper limb: Secondary | ICD-10-CM | POA: Diagnosis present

## 2023-07-31 DIAGNOSIS — Z853 Personal history of malignant neoplasm of breast: Secondary | ICD-10-CM

## 2023-07-31 DIAGNOSIS — I5031 Acute diastolic (congestive) heart failure: Secondary | ICD-10-CM | POA: Diagnosis not present

## 2023-07-31 DIAGNOSIS — D62 Acute posthemorrhagic anemia: Secondary | ICD-10-CM | POA: Diagnosis not present

## 2023-07-31 DIAGNOSIS — D72829 Elevated white blood cell count, unspecified: Secondary | ICD-10-CM | POA: Diagnosis not present

## 2023-07-31 DIAGNOSIS — J9601 Acute respiratory failure with hypoxia: Secondary | ICD-10-CM | POA: Diagnosis not present

## 2023-07-31 DIAGNOSIS — Z4682 Encounter for fitting and adjustment of non-vascular catheter: Secondary | ICD-10-CM | POA: Diagnosis not present

## 2023-07-31 DIAGNOSIS — I480 Paroxysmal atrial fibrillation: Secondary | ICD-10-CM | POA: Diagnosis present

## 2023-07-31 DIAGNOSIS — I509 Heart failure, unspecified: Secondary | ICD-10-CM | POA: Diagnosis not present

## 2023-07-31 DIAGNOSIS — I4891 Unspecified atrial fibrillation: Secondary | ICD-10-CM | POA: Diagnosis not present

## 2023-07-31 DIAGNOSIS — Z452 Encounter for adjustment and management of vascular access device: Secondary | ICD-10-CM | POA: Diagnosis not present

## 2023-07-31 DIAGNOSIS — Z7984 Long term (current) use of oral hypoglycemic drugs: Secondary | ICD-10-CM | POA: Diagnosis not present

## 2023-07-31 DIAGNOSIS — J9602 Acute respiratory failure with hypercapnia: Secondary | ICD-10-CM | POA: Diagnosis not present

## 2023-07-31 DIAGNOSIS — Y838 Other surgical procedures as the cause of abnormal reaction of the patient, or of later complication, without mention of misadventure at the time of the procedure: Secondary | ICD-10-CM | POA: Diagnosis not present

## 2023-07-31 DIAGNOSIS — R739 Hyperglycemia, unspecified: Secondary | ICD-10-CM | POA: Diagnosis not present

## 2023-07-31 DIAGNOSIS — I059 Rheumatic mitral valve disease, unspecified: Secondary | ICD-10-CM | POA: Diagnosis not present

## 2023-07-31 DIAGNOSIS — I2699 Other pulmonary embolism without acute cor pulmonale: Secondary | ICD-10-CM | POA: Diagnosis not present

## 2023-07-31 DIAGNOSIS — T502X5A Adverse effect of carbonic-anhydrase inhibitors, benzothiadiazides and other diuretics, initial encounter: Secondary | ICD-10-CM | POA: Diagnosis present

## 2023-07-31 DIAGNOSIS — Z952 Presence of prosthetic heart valve: Secondary | ICD-10-CM | POA: Diagnosis not present

## 2023-07-31 DIAGNOSIS — G4700 Insomnia, unspecified: Secondary | ICD-10-CM | POA: Diagnosis present

## 2023-07-31 DIAGNOSIS — R7881 Bacteremia: Secondary | ICD-10-CM | POA: Diagnosis not present

## 2023-07-31 DIAGNOSIS — R791 Abnormal coagulation profile: Secondary | ICD-10-CM | POA: Diagnosis not present

## 2023-07-31 DIAGNOSIS — R011 Cardiac murmur, unspecified: Secondary | ICD-10-CM | POA: Diagnosis not present

## 2023-07-31 DIAGNOSIS — I071 Rheumatic tricuspid insufficiency: Secondary | ICD-10-CM | POA: Diagnosis present

## 2023-07-31 DIAGNOSIS — E875 Hyperkalemia: Secondary | ICD-10-CM | POA: Diagnosis not present

## 2023-07-31 LAB — COMPREHENSIVE METABOLIC PANEL WITH GFR
ALT: 25 U/L (ref 0–44)
AST: 26 U/L (ref 15–41)
Albumin: 3.7 g/dL (ref 3.5–5.0)
Alkaline Phosphatase: 76 U/L (ref 38–126)
Anion gap: 18 — ABNORMAL HIGH (ref 5–15)
BUN: 13 mg/dL (ref 8–23)
CO2: 20 mmol/L — ABNORMAL LOW (ref 22–32)
Calcium: 9.6 mg/dL (ref 8.9–10.3)
Chloride: 93 mmol/L — ABNORMAL LOW (ref 98–111)
Creatinine, Ser: 1.11 mg/dL — ABNORMAL HIGH (ref 0.44–1.00)
GFR, Estimated: 55 mL/min — ABNORMAL LOW (ref >60)
Glucose, Bld: 288 mg/dL — ABNORMAL HIGH (ref 70–99)
Potassium: 3.6 mmol/L (ref 3.5–5.1)
Sodium: 131 mmol/L — ABNORMAL LOW (ref 135–145)
Total Bilirubin: 0.5 mg/dL (ref 0.0–1.2)
Total Protein: 8.1 g/dL (ref 6.5–8.1)

## 2023-07-31 LAB — RENAL FUNCTION PANEL
Albumin: 3.6 g/dL (ref 3.5–5.2)
BUN: 13 mg/dL (ref 6–23)
CO2: 26 meq/L (ref 19–32)
Calcium: 9 mg/dL (ref 8.4–10.5)
Chloride: 95 meq/L — ABNORMAL LOW (ref 96–112)
Creatinine, Ser: 0.99 mg/dL (ref 0.40–1.20)
GFR: 60.17 mL/min (ref >60.00)
Glucose, Bld: 246 mg/dL — ABNORMAL HIGH (ref 70–99)
Phosphorus: 3.1 mg/dL (ref 2.3–4.6)
Potassium: 3.6 meq/L (ref 3.5–5.1)
Sodium: 131 meq/L — ABNORMAL LOW (ref 135–145)

## 2023-07-31 LAB — CBC WITH DIFFERENTIAL/PLATELET
Abs Immature Granulocytes: 0.05 10*3/uL (ref 0.00–0.07)
Basophils Absolute: 0 10*3/uL (ref 0.0–0.1)
Basophils Relative: 0 %
Eosinophils Absolute: 0 10*3/uL (ref 0.0–0.5)
Eosinophils Relative: 0 %
HCT: 39.5 % (ref 36.0–46.0)
Hemoglobin: 13.8 g/dL (ref 12.0–15.0)
Immature Granulocytes: 1 %
Lymphocytes Relative: 8 %
Lymphs Abs: 0.8 10*3/uL (ref 0.7–4.0)
MCH: 28.9 pg (ref 26.0–34.0)
MCHC: 34.9 g/dL (ref 30.0–36.0)
MCV: 82.8 fL (ref 80.0–100.0)
Monocytes Absolute: 0.9 10*3/uL (ref 0.1–1.0)
Monocytes Relative: 10 %
Neutro Abs: 7.8 10*3/uL — ABNORMAL HIGH (ref 1.7–7.7)
Neutrophils Relative %: 81 %
Platelets: 166 10*3/uL (ref 150–400)
RBC: 4.77 MIL/uL (ref 3.87–5.11)
RDW: 12.7 % (ref 11.5–15.5)
Smear Review: NORMAL
WBC: 9.7 10*3/uL (ref 4.0–10.5)
nRBC: 0 % (ref 0.0–0.2)

## 2023-07-31 LAB — URINALYSIS, W/ REFLEX TO CULTURE (INFECTION SUSPECTED)
Glucose, UA: >=500 mg/dL — AB
Ketones, ur: 15 mg/dL — AB
Nitrite: NEGATIVE
Protein, ur: 100 mg/dL — AB
Specific Gravity, Urine: 1.025 (ref 1.005–1.030)
pH: 5.5 (ref 5.0–8.0)

## 2023-07-31 LAB — LACTIC ACID, PLASMA
Lactic Acid, Venous: 1.4 mmol/L (ref 0.5–1.9)
Lactic Acid, Venous: 1.3 mmol/L (ref 0.5–1.9)

## 2023-07-31 LAB — RESP PANEL BY RT-PCR (RSV, FLU A&B, COVID)  RVPGX2
Influenza A by PCR: NEGATIVE
Influenza B by PCR: NEGATIVE
Resp Syncytial Virus by PCR: NEGATIVE
SARS Coronavirus 2 by RT PCR: NEGATIVE

## 2023-07-31 LAB — HEMOGLOBIN A1C: Hgb A1c MFr Bld: 12.8 % — ABNORMAL HIGH (ref 4.6–6.5)

## 2023-07-31 LAB — PROTIME-INR
INR: 1.1 (ref 0.8–1.2)
Prothrombin Time: 14.2 s (ref 11.4–15.2)

## 2023-07-31 LAB — GLUCOSE, CAPILLARY: Glucose-Capillary: 333 mg/dL — ABNORMAL HIGH (ref 70–99)

## 2023-07-31 MED ORDER — LACTATED RINGERS IV SOLN: INTRAVENOUS | Status: AC

## 2023-07-31 MED ORDER — LACTATED RINGERS IV BOLUS (SEPSIS)
1000.0000 mL | Freq: Once | INTRAVENOUS | Status: AC
  Administered 2023-07-31: 1000 mL via INTRAVENOUS

## 2023-07-31 MED ORDER — ONDANSETRON HCL 4 MG/2ML IJ SOLN
4.0000 mg | Freq: Four times a day (QID) | INTRAMUSCULAR | Status: AC | PRN
Start: 2023-07-31 — End: ?

## 2023-07-31 MED ORDER — SODIUM CHLORIDE 0.9 % IV SOLN
2.0000 g | Freq: Once | INTRAVENOUS | Status: AC
  Administered 2023-07-31: 2 g via INTRAVENOUS
  Filled 2023-07-31: qty 12.5

## 2023-07-31 MED ORDER — LACTATED RINGERS IV BOLUS (SEPSIS): 1000.0000 mL | Freq: Once | INTRAVENOUS | Status: DC

## 2023-07-31 MED ORDER — VANCOMYCIN HCL IN DEXTROSE 1-5 GM/200ML-% IV SOLN: 1000.0000 mg | Freq: Once | INTRAVENOUS | Status: DC

## 2023-07-31 MED ORDER — INSULIN ASPART 100 UNIT/ML IJ SOLN
0.0000 [IU] | Freq: Three times a day (TID) | INTRAMUSCULAR | Status: DC
  Administered 2023-08-01: 3 [IU] via SUBCUTANEOUS
  Administered 2023-08-01: 5 [IU] via SUBCUTANEOUS
  Administered 2023-08-01 – 2023-08-02 (×3): 3 [IU] via SUBCUTANEOUS
  Administered 2023-08-02: 2 [IU] via SUBCUTANEOUS
  Administered 2023-08-03 (×2): 5 [IU] via SUBCUTANEOUS
  Administered 2023-08-03: 2 [IU] via SUBCUTANEOUS

## 2023-07-31 MED ORDER — ENSURE ENLIVE PO LIQD
237.0000 mL | Freq: Two times a day (BID) | ORAL | Status: DC
  Administered 2023-08-01 – 2023-08-02 (×4): 237 mL via ORAL

## 2023-07-31 MED ORDER — MELATONIN 3 MG PO TABS
3.0000 mg | ORAL_TABLET | Freq: Every evening | ORAL | Status: DC | PRN
  Administered 2023-07-31 – 2023-08-02 (×3): 3 mg via ORAL
  Filled 2023-07-31 (×3): qty 1

## 2023-07-31 MED ORDER — ACETAMINOPHEN 325 MG PO TABS: 650.0000 mg | ORAL_TABLET | Freq: Four times a day (QID) | ORAL | Status: DC | PRN

## 2023-07-31 MED ORDER — ACETAMINOPHEN 650 MG RE SUPP: 650.0000 mg | Freq: Four times a day (QID) | RECTAL | Status: DC | PRN

## 2023-07-31 MED ORDER — VANCOMYCIN HCL IN DEXTROSE 1-5 GM/200ML-% IV SOLN
1000.0000 mg | Freq: Once | INTRAVENOUS | Status: AC
  Administered 2023-07-31: 1000 mg via INTRAVENOUS
  Filled 2023-07-31: qty 200

## 2023-07-31 MED ORDER — ACETAMINOPHEN 500 MG PO TABS
1000.0000 mg | ORAL_TABLET | Freq: Once | ORAL | Status: AC
  Administered 2023-07-31: 1000 mg via ORAL
  Filled 2023-07-31: qty 2

## 2023-07-31 MED ORDER — INSULIN GLARGINE-YFGN 100 UNIT/ML ~~LOC~~ SOLN
15.0000 [IU] | Freq: Every day | SUBCUTANEOUS | Status: DC
  Administered 2023-08-01 – 2023-08-02 (×3): 15 [IU] via SUBCUTANEOUS
  Filled 2023-07-31 (×3): qty 0.15

## 2023-07-31 MED ORDER — METRONIDAZOLE 500 MG/100ML IV SOLN
500.0000 mg | Freq: Once | INTRAVENOUS | Status: AC
  Administered 2023-07-31: 500 mg via INTRAVENOUS
  Filled 2023-07-31: qty 100

## 2023-07-31 MED ORDER — INSULIN ASPART 100 UNIT/ML IJ SOLN
0.0000 [IU] | Freq: Every day | INTRAMUSCULAR | Status: DC
  Administered 2023-07-31: 4 [IU] via SUBCUTANEOUS
  Administered 2023-08-01 – 2023-08-04 (×4): 2 [IU] via SUBCUTANEOUS

## 2023-07-31 NOTE — H&P (Signed)
 History and Physical      Melissa Bray ZOX:[id number] DOB: 14-Jan-1961 DOA: 07/31/2023; DOS: 07/31/2023  PCP: Kandace Organ, NP  Patient coming from: home   I have personally briefly reviewed patient's old medical records in [facility]  Chief Complaint: Generalized weakness  HPI: Melissa Bray is a 63 y.o. female with medical history significant for type 2 diabetes mellitus, essential hypertension, hyperlipidemia, breast cancer status post left mastectomy in 2016, who is admitted to [facility] on 07/31/2023 by way of transfer from [facility] with suspected severe sepsis due to left upper extremity cellulitis after presenting from home to the latter facility complaining of generalized weakness.   Patient reports 5 to 6 days of generalized weakness in the absence of any acute focal weakness.  She notes that this has been associated with subjective fever over the last 3 to 4 days, in the absence of chills, full body rigors, or generalized myalgias.  Additionally, over the last 3 to 4 days, she has noted development of left upper extremity erythema, swelling, increased warmth, tenderness in distribution just proximal to the left elbow.  She relates that she has a history of chronic lymphedema in the left upper extremity, but that, over the last 3 to 4 days, she has noticed significant increase in the degree of swelling, now with erythema, increased warmth and tenderness, all of which are new for her.  No recent overt trauma to the left upper extremity.  Denies any associated acute focal numbness or paresthesias.  Denies rash to any other location.  Denies any associated headache, neck stiffness, shortness of breath, cough, abdominal pain, diarrhea, dysuria or gross hematuria.  No recent chest pain, palpitations or nausea/vomiting.  Over the last 4 to 5 days, she is also noted decline in oral intake as a result of associated decrease in appetite over that  timeframe.    [facility] ED Course:  Vital signs in the ED were notable for the following: Temperature max 1-3.2; heart rates initially in the 1 teens, essentially decreasing into the 90s following IV fluids; initial blood pressure 95/65, with ensuing increase in systolic blood pressures into the 1 teens 120s following interval IV fluids; respiratory rate 19-24, oxygen saturation 94 to 98% on room air.  Labs were notable for the following: CMP was notable for the following: Sodium 131, which corrected to approximately 134 when taking into account concomitant hyperglycemia, potassium 3.6, bicarbonate 20, anion gap 18, creatinine 1.11 compared to 0.8 in November 2024, glucose 288, liver enzymes were within normal limits.  Hemoglobin A1c 12.8%.  Initial lactic acid 1.4, with repeat value trending down to 1.3.  CBC notable for white blood cell count 9700 with 81% neutrophils.  INR 1.1.  Urinalysis showed 11-20 white blood cells, nitrate negative, small leukocyte esterase, along with 6-10 squamous cells, 100 protein and specific gravity of 1.025.  Blood cultures x 2 were collected prior to initiation of antibiotics.  COVID, influenza, RSV PCR were all negative.  Per my interpretation, EKG in ED demonstrated the following: Sinus tachycardia with heart rate 106, normal intervals, no evidence of T wave changes, nonspecific less than 1 mm ST elevation in leads III, aVF, no evidence of greater than 1 mm ST elevation.  Imaging in the ED, per corresponding formal radiology read, was notable for the following: 2 view chest x-ray showed no evidence of acute cardiopulmonary process, including no evidence of infiltrate, edema, effusion, or pneumothorax.  While in the  ED, the following were administered: Acetaminophen  1 g p.o. x 1 dose, cefepime , IV vancomycin , IV Flagyl , lactated Ringer 's x 2 L bolus followed by continuous LR running at 150 cc/h.  Subsequently, the patient was admitted to [facility] for  further evaluation management of presenting severe sepsis due to suspected left upper extremity cellulitis presenting with generalized weakness, with presenting labs also notable for anion gap metabolic acidosis as well as acute kidney injury.    Review of Systems: As per HPI otherwise 10 point review of systems negative.   Past Medical History:  Diagnosis Date   Anxiety    Breast cancer (HCC)    Diabetes mellitus without complication (HCC)    Heart murmur    age 58   Hyperlipidemia    Hypertension    Type 2 diabetes mellitus without retinopathy (HCC) 06/17/2017    Past Surgical History:  Procedure Laterality Date   ABDOMINAL HYSTERECTOMY     BREAST BIOPSY     MASTECTOMY Left 2016    Social History:  reports that she has never smoked. She has never been exposed to tobacco smoke. She has never used smokeless tobacco. She reports that she does not drink alcohol and does not use drugs.   No Known Allergies  Family History  Problem Relation Age of Onset   Asthma Mother    Cancer Father        prostate cancer   Diabetes Father    Hypertension Father    Cancer Brother        prostate cancer   Asthma Brother    Colon cancer Neg Hx    Colon polyps Neg Hx    Crohn's disease Neg Hx    Esophageal cancer Neg Hx    Rectal cancer Neg Hx    Stomach cancer Neg Hx    Ulcerative colitis Neg Hx     Family history reviewed and not pertinent    Prior to Admission medications   Medication Sig Start Date End Date Taking? Authorizing Provider  alendronate  (FOSAMAX ) 70 MG tablet Take 1 tablet (70 mg total) by mouth once a week. Take with a full glass of water on an empty stomach. 04/04/23   Nche, Connye Delaine, NP  amLODipine  (NORVASC ) 10 MG tablet Take 1 tablet (10 mg total) by mouth at bedtime. 05/16/23   Nche, Connye Delaine, NP  anastrozole  (ARIMIDEX ) 1 MG tablet Take 1 mg by mouth daily.    [provider]  cloNIDine  (CATAPRES ) 0.1 MG tablet Take 1 tablet (0.1 mg total) by  mouth 2 (two) times daily. 04/04/23   Nche, Connye Delaine, NP  Continuous Glucose Sensor (FREESTYLE LIBRE 3 PLUS SENSOR) MISC Inject 1 Units into the skin every 14 (fourteen) days. Change sensor every 15 days. 06/27/23   Nche, Connye Delaine, NP  DULoxetine  (CYMBALTA ) 30 MG capsule Take 1 capsule (30 mg total) by mouth daily. 04/04/23   Nche, Connye Delaine, NP  empagliflozin  (JARDIANCE ) 10 MG TABS tablet Take 1 tablet (10 mg total) by mouth in the morning. 06/27/23   Nche, Connye Delaine, NP  glipiZIDE  (GLUCOTROL  XL) 10 MG 24 hr tablet Take 1 tablet (10 mg total) by mouth daily with breakfast. 04/04/23   Nche, Connye Delaine, NP  insulin  glargine (LANTUS ) 100 UNIT/ML Solostar Pen Inject 20 Units into the skin daily. 05/16/23   Nche, Connye Delaine, NP  Insulin  Pen Needle (PEN NEEDLES) 31G X 6 MM MISC 1 application  by Does not apply route at bedtime. 04/04/23  Nche, Connye Delaine, NP  lisinopril  (ZESTRIL ) 40 MG tablet Take 1 tablet (40 mg total) by mouth daily. 04/04/23   Nche, Connye Delaine, NP  metFORMIN  (GLUCOPHAGE ) 850 MG tablet Take 1 tablet (850 mg total) by mouth 2 (two) times daily with a meal. 05/28/23   Nche, Connye Delaine, NP  metoprolol  succinate (TOPROL -XL) 100 MG 24 hr tablet Take 1 tablet (100 mg total) by mouth daily. 04/04/23   Nche, Connye Delaine, NP  pravastatin  (PRAVACHOL ) 20 MG tablet Take 1 tablet (20 mg total) by mouth daily. 04/04/23   Nche, Connye Delaine, NP  Vitamin D , Ergocalciferol , (DRISDOL ) 1.25 MG (50000 UNIT) CAPS capsule TAKE 1 CAPSULE ONCE A WEEK FOR 12 WEEKS. AFTER 12 WEEKS SWITCH TO OVER THE COUNTER VITAMIN D  2000 IU 04/04/23   NcheConnye Delaine, NP     Objective    Physical Exam: Vitals:   07/31/23 1947 07/31/23 2004 07/31/23 2030 07/31/23 2145  BP:  95/65 101/61 127/70  Pulse:  (!) 106 99 91  Resp:  (!) 24 (!) 24 19  Temp: (!) 101 F (38.3 C)     TempSrc: Oral     SpO2:  96% 96% 94%  Weight:      Height:        General: appears to be stated age; alert, oriented Skin:  warm, dry, ; left upper extremity erythema, increased warmth, tenderness, swelling just proximal to the left elbow in the absence of any associated drainage Head:  AT/[place] Mouth:  Oral mucosa membranes appear dry, normal dentition Neck: supple; trachea midline Heart:  RRR; did not appreciate any M/R/G Lungs: CTAB, did not appreciate any wheezes, rales, or rhonchi Abdomen: + BS; soft, ND, NT Vascular: 2+ pedal pulses b/l; 2+ radial pulses b/l Extremities: no muscle wasting; left upper extremity erythema, tenderness, increased warmth, swelling, as further outlined above Neuro: strength and sensation intact in upper and lower extremities b/l    Labs on Admission: I have personally reviewed following labs and imaging studies  CBC: Recent Labs  Lab 07/31/23 1734  WBC 9.7  NEUTROABS 7.8*  HGB 13.8  HCT 39.5  MCV 82.8  PLT 166   Basic Metabolic Panel: Recent Labs  Lab 07/31/23 0809 07/31/23 1734  NA 131* 131*  K 3.6 3.6  CL 95* 93*  CO2 26 20*  GLUCOSE 246* 288*  BUN 13 13  CREATININE 0.99 1.11*  CALCIUM 9.0 9.6  PHOS 3.1  --    GFR: Estimated Creatinine Clearance: 65.8 mL/min (A) (by C-G formula based on SCr of 1.11 mg/dL (H)). Liver Function Tests: Recent Labs  Lab 07/31/23 0809 07/31/23 1734  AST  --  26  ALT  --  25  ALKPHOS  --  76  BILITOT  --  0.5  PROT  --  8.1  ALBUMIN 3.6 3.7   No results for input(s): "LIPASE", "AMYLASE" in the last 168 hours. No results for input(s): "AMMONIA" in the last 168 hours. Coagulation Profile: Recent Labs  Lab 07/31/23 1900  INR 1.1   Cardiac Enzymes: No results for input(s): "CKTOTAL", "CKMB", "CKMBINDEX", "TROPONINI" in the last 168 hours. BNP (last 3 results) No results for input(s): "PROBNP" in the last 8760 hours. HbA1C: Recent Labs    07/31/23 0809  HGBA1C 12.8*   CBG: No results for input(s): "GLUCAP" in the last 168 hours. Lipid Profile: No results for input(s): "CHOL", "HDL", "LDLCALC", "TRIG",  "CHOLHDL", "LDLDIRECT" in the last 72 hours. Thyroid  Function Tests: No results for input(s): "TSH", "  T4TOTAL", "FREET4", "T3FREE", "THYROIDAB" in the last 72 hours. Anemia Panel: No results for input(s): "VITAMINB12", "FOLATE", "FERRITIN", "TIBC", "IRON", "RETICCTPCT" in the last 72 hours. Urine analysis:    Component Value Date/Time   COLORURINE AMBER (A) 07/31/2023 1734   APPEARANCEUR CLEAR 07/31/2023 1734   LABSPEC 1.025 07/31/2023 1734   PHURINE 5.5 07/31/2023 1734   GLUCOSEU >=500 (A) 07/31/2023 1734   HGBUR TRACE (A) 07/31/2023 1734   BILIRUBINUR SMALL (A) 07/31/2023 1734   KETONESUR 15 (A) 07/31/2023 1734   PROTEINUR 100 (A) 07/31/2023 1734   NITRITE NEGATIVE 07/31/2023 1734   LEUKOCYTESUR SMALL (A) 07/31/2023 1734    Radiological Exams on Admission: DG Chest 2 View Result Date: 07/31/2023 CLINICAL DATA:  Fever and chills. EXAM: CHEST - 2 VIEW COMPARISON:  November 23, 2019 FINDINGS: The heart size and mediastinal contours are within normal limits. Low lung volumes are noted with mild elevation of the right hemidiaphragm. There is no evidence of acute infiltrate, pleural effusion or pneumothorax. Radiopaque surgical clips are seen overlying the lateral aspect of the upper left hemithorax. The visualized skeletal structures are unremarkable. IMPRESSION: No active cardiopulmonary disease. Electronically Signed   By: Virgle Grime M.D.   On: 07/31/2023 18:46      Assessment/Plan    Principal Problem:   Severe sepsis (HCC) Active Problems:   Essential hypertension   DM2 (diabetes mellitus, type 2) (HCC)   HLD (hyperlipidemia)   Cellulitis of left upper extremity   Generalized weakness   High anion gap metabolic acidosis   AKI (acute kidney injury) (HCC)   Dehydration   GAD (generalized anxiety disorder)   History of breast cancer     #) Severe sepsis due to left upper extremity cellulitis: Suspected diagnosis in the setting of 3 to 4 days of subjective fever, with  finding of objective fever in the emergency department this evening, associated with new onset upper extremity erythema, tenderness, swelling, increased warmth relative to right upper extremity.  This appears superimposed on her report of lymphedema involving the left upper extremity, representing potential portal of entry as a consequence of associated stasis of lymphatic flow.  No recent preceding trauma involving the left upper extremity.  No evidence of crepitus on exam to suggest underlying necrotizing fasciitis.  Left upper extremity appears neurovascularly intact.  SIRS criteria met via objective fever, tachycardia, tachypnea.  CBC also reflects neutrophilic predominance associated with her white blood cell count . lactic acid level: Nonelevated at 1.4. Of note, given the associated presence of suspected end organ damage in the form of concominant presenting acute kidney injury, criteria are met for pt's sepsis to be considered severe in nature. However, in the absence of lactic acid level that is greater than or equal to 4.0, and in the absence of any associated hypotension refractory to IVF's, there are no indications for administration of a 30 mL/kg IVF bolus at this time.  At [facility], she received 2 L LR bolus.   Additional ED work-up/management notable for: Blood cultures x 2 were collected at [facility] prior to receipt of IV vancomycin , cefepime , and Flagyl .  Will de-escalate these IV antibiotics to vancomycin  and Rocephin  for now.  No e/o additional infectious process at this time, including 2 view chest x-ray which showed no evidence of acute cardiopulmonary process, including no evidence of infiltrate, We will COVID, influenza, RSV PCR were all negative.  No evidence of meningismus to suggest meningitis.  Presenting urinalysis does show 11-20 white  blood cells, although this does not meet quantitative threshold for significant pyuria in a female.  Additionally,  urinalysis is associated with the presence of squamous epithelial cells suggesting a contaminated specimen in this patient with no report of acute urinary symptoms.  Overall, presentation and urinalysis appear inconsistent with urinary tract infection.  Will add on and monitor urine culture.   Plan: CBC w/ diff and CMP in AM.  Follow for results of blood cx's x 2. Abx: Continue IV vancomycin .  Start Rocephin , as above.  Continuous lactated Ringer 's at 150 cc/h.  Add on urine culture, as above.  Add on procalcitonin level.  Check venous ultrasound of the left upper extremity to evaluate for any underlying acute DVT.  Prn acetaminophen  for fever and pain.  Monitor on telemetry.                   #) Generalized weakness: 5 to 6-day duration of generalized weakness, in the absence of any evidence of acute focal neurologic deficits, including no evidence of acute focal weakness to suggest acute CVA.  Suspect contribution from physiologic stress stemming from presenting severe sepsis due to suspected left upper extremity cellulitis, as above.  There may also be an additional contribution from clinical evidence of dehydration, as below.  Will further eval for any additional contributions from endocrine/metabolic sources, as detailed below.   Plan: work-up and management of presenting severe sepsis due to suspected left upper extremity cellulitis, as described above. PT/OT consults ordered for the AM. Fall precautions. CMP/CBC in the AM. Check TSH, serum Mg level. Check B12 level.  Add on procalcitonin level.  IV fluids, as indicated above.                     #) Acute Kidney Injury:   Presenting creatinine 1.11 compared to most recent provide 0.8 in November 2024. Suspect that this is prerenal in nature d/t potential multifactorial contributions including dehydration in setting of recently diminished PO intake,  as well as suspected additional contribution to intravascular  depletion from severe sepsis itself d/t associated increase in capillary permeability.  Potential additional pharmacologic contribution from outpatient lisinopril .  Presenting urinalysis shows 11-20 white blood cells, as well as 6-10 red blood cells and 100 protein.  Additionally, specific gravity is mildly elevated, consistent with a clinical picture of dehydration.  Plan: monitor strict I's & O's and daily weights. Attempt to avoid nephrotoxic agents.  Hold home lisinopril  for now refrain from NSAIDs. Repeat CMP in the morning. Check serum magnesium  level. Add-on random urine sodium and random urine creatinine.  Further evaluation management of severe sepsis, as above.  IV fluids, as above.                   #) Anion gap metabolic acidosis: Presenting CMP reflects mild anion gap metabolic acidosis, with bicarbonate 20 and anion gap 18.  Suspect that this is multifactorial in etiology, including contributions from presenting sepsis, acute kidney injury, dehydration.  Of note, synthetic hepatic function appears preserved, with presenting INR 1.1.  There may also be contribution from starvation ketosis given report of 5 days of diminished oral intake, with the presence of ketones noted on presenting urinalysis.  While presentation is associated with mildly elevated blood sugar, DKA is felt to be less likely at this time.  Will check beta-hydroxybutyrate acid as well as blood gas to further assess, providing interval IV fluids basal insulin  with serial Accu-Cheks as outlined below.  Of  note, lactic acid x 2 were found to be nonelevated and trending down, as quantified above.  Plan: Further evaluation management presents for sepsis due to cellulitis, as above.  IV fluids, as above.  Further evaluation management of acute kidney injury, as above.  Check salicylate level.  Check VBG, beta-hydroxybutyrate acid level.  Initiate serial Accu-Cheks along with basal insulin , as further outlined below.   Repeat CMP in the morning.  Check urinary drug screen.                         #) Dehydration: Clinical suspicion for such, including the appearance of dry oral mucous membranes as well as laboratory findings notable for UA demonstrating elevated specific gravity, will initial tachycardia improved with IV fluids . appears to be in the setting of   patient's report of recent decline in oral intake as a consequence of corresponding diminished appetite over that timeframe, along with ostensible losses in the setting of presenting severe sepsis.     Plan: Monitor strict I's and O's.  Daily weights.  CMP in the morning.will  continue the existing IV fluids initiated at [facility] in the form of LR running at 150 cc/h.                    #) Type 2 Diabetes Mellitus: documented history of such. Home insulin  regimen: Lantus  20 units SQ daily. Home oral hypoglycemic agents: Metformin , Jardiance , glipizide . presenting blood sugar: 288. Most recent A1c noted to be 12.8% when checked at [facility] earlier today.  As noted above, presentation is associated with mildly elevated anion gap metabolic acidosis, although this is felt to be less likely due to be representative of underlying DKA.  Will provide additional IV fluids, initiate basal insulin , pursue serial Accu-Cheks with sliding scale insulin , and also further evaluate with blood gas as well as beta hydroxybutyric acid level, as below. in terms of initial dose of basal insulin  to be started during this hospitalization, will resume approximately half of outpatient dose in order to reduce risk for ensuing hypoglycemia  Plan: accuchecks QAC and HS with low dose SSI, with first check to occur now followed by repeat CBG in 2 hours.  Lantus  15 units SQ nightly, first dose now . hold home oral hypoglycemic agents during this hospitalization.  Check VBG, beta hydroxybutyric acid level.  CMP in the morning.  IV  fluids, as above.                    #) Essential Hypertension: documented h/o such, with outpatient antihypertensive regimen including oral clonidine , amlodipine , metoprolol  succinate, lisinopril .  SBP's in the ED today: Initially in the 90s, subsequently increasing into the 1 teens to 120s following IV fluids mmHg. in the setting of presenting severe sepsis as well as initial soft blood pressures, will hold home and hypertensive medications for now.  Additionally, in the setting of acute kidney injury, will hold home lisinopril  for now.  Plan: Close monitoring of subsequent BP via routine VS. hold home antihypertensive medications for now, as above.  Monitor strict I's and O's and daily weights.                      #) Hyperlipidemia: documented h/o such. On pravastatin  as outpatient.   Plan: continue home statin.                      #)  Generalized anxiety disorder: documented h/o such. On Cymbalta  as outpatient.  Presenting EKG shows no evidence of QTc prolongation.  Plan: Resume home Cymbalta .                    #) history of left-sided breast cancer: Status post left mastectomy in 2016.  On Arimidex .    Plan: Continue Arimidex .      DVT prophylaxis: SCD's   Code Status: Full code Family Communication: none Disposition Plan: Per Rounding Team Consults called: none;  Admission status: Inpatient    I SPENT GREATER THAN 75  MINUTES IN CLINICAL CARE TIME/MEDICAL DECISION-MAKING IN COMPLETING THIS ADMISSION.     Melissa Lill B Elwood Bazinet DO [facility] From 7PM - 7AM   07/31/2023, 11:22 PM

## 2023-07-31 NOTE — ED Triage Notes (Signed)
 Fatigue, decreased appetite, generalized weakness since Saturday. Denies chest pain, SHOB, cough.

## 2023-07-31 NOTE — ED Provider Notes (Signed)
 [facility] [facility] Provider Note   CSN: [id number] Arrival date & time: 07/31/23  1722     History  Chief Complaint  Patient presents with   Fatigue    Melissa Bray is a 63 y.o. female.  63 year old female with a past medical history of hypertension, DM, Anxiety presents to the ED with a chief complaint of weakness x 5 days. Patient reports feeling over rundown for the past 5 days, has been taking some Tylenol  to help with her symptoms without much improvement.  She also reports worsening fatigue, feeling like she is not wanting to do anything.In addition, lack of appetite has been eating less over the last few days. She denies any other symptoms.  Reports no urinary symptoms, no chest pain, no shortness of breath, no ear pain, no sore throat, no abdominal pain, no nausea, no vomiting.  The history is provided by the patient.       Home Medications Prior to Admission medications   Medication Sig Start Date End Date Taking? Authorizing Provider  alendronate  (FOSAMAX ) 70 MG tablet Take 1 tablet (70 mg total) by mouth once a week. Take with a full glass of water on an empty stomach. 04/04/23   Nche, Connye Delaine, NP  amLODipine  (NORVASC ) 10 MG tablet Take 1 tablet (10 mg total) by mouth at bedtime. 05/16/23   Nche, Connye Delaine, NP  anastrozole  (ARIMIDEX ) 1 MG tablet Take 1 mg by mouth daily.    [provider]  cloNIDine  (CATAPRES ) 0.1 MG tablet Take 1 tablet (0.1 mg total) by mouth 2 (two) times daily. 04/04/23   Nche, Connye Delaine, NP  Continuous Glucose Sensor (FREESTYLE LIBRE 3 PLUS SENSOR) MISC Inject 1 Units into the skin every 14 (fourteen) days. Change sensor every 15 days. 06/27/23   Nche, Connye Delaine, NP  DULoxetine  (CYMBALTA ) 30 MG capsule Take 1 capsule (30 mg total) by mouth daily. 04/04/23   Nche, Connye Delaine, NP  empagliflozin  (JARDIANCE ) 10 MG TABS tablet Take 1 tablet (10 mg total) by mouth in the morning. 06/27/23   Nche,  Connye Delaine, NP  glipiZIDE  (GLUCOTROL  XL) 10 MG 24 hr tablet Take 1 tablet (10 mg total) by mouth daily with breakfast. 04/04/23   Nche, Connye Delaine, NP  insulin  glargine (LANTUS ) 100 UNIT/ML Solostar Pen Inject 20 Units into the skin daily. 05/16/23   Nche, Connye Delaine, NP  Insulin  Pen Needle (PEN NEEDLES) 31G X 6 MM MISC 1 application  by Does not apply route at bedtime. 04/04/23   Nche, Connye Delaine, NP  lisinopril  (ZESTRIL ) 40 MG tablet Take 1 tablet (40 mg total) by mouth daily. 04/04/23   Nche, Connye Delaine, NP  metFORMIN  (GLUCOPHAGE ) 850 MG tablet Take 1 tablet (850 mg total) by mouth 2 (two) times daily with a meal. 05/28/23   Nche, Connye Delaine, NP  metoprolol  succinate (TOPROL -XL) 100 MG 24 hr tablet Take 1 tablet (100 mg total) by mouth daily. 04/04/23   Nche, Connye Delaine, NP  pravastatin  (PRAVACHOL ) 20 MG tablet Take 1 tablet (20 mg total) by mouth daily. 04/04/23   Nche, Connye Delaine, NP  Vitamin D , Ergocalciferol , (DRISDOL ) 1.25 MG (50000 UNIT) CAPS capsule TAKE 1 CAPSULE ONCE A WEEK FOR 12 WEEKS. AFTER 12 WEEKS SWITCH TO OVER THE COUNTER VITAMIN D  2000 IU 04/04/23   Nche, Connye Delaine, NP      Allergies    Patient has no known allergies.    Review of Systems   Review  of Systems  Constitutional:  Positive for fever. Negative for chills.  Respiratory:  Negative for shortness of breath.   Cardiovascular:  Negative for chest pain.  Gastrointestinal:  Negative for abdominal pain, nausea and vomiting.  Genitourinary:  Negative for flank pain.  All other systems reviewed and are negative.   Physical Exam Updated Vital Signs BP 101/61   Pulse 99   Temp (!) 101 F (38.3 C) (Oral)   Resp (!) 24   Ht 5\' 5"  (1.651 m)   Wt 117.9 kg   SpO2 96%   BMI 43.27 kg/m  Physical Exam Vitals and nursing note reviewed.  Constitutional:      Appearance: Normal appearance.  HENT:     Head: Normocephalic and atraumatic.     Mouth/Throat:     Mouth: Mucous membranes are moist.  Neck:      Comments: Full range of motion of the neck, no meningeal signs. Cardiovascular:     Rate and Rhythm: Tachycardia present.  Pulmonary:     Effort: Pulmonary effort is normal.     Comments: No wheezing,rhonchi, or rales.  Abdominal:     General: Abdomen is flat.     Tenderness: There is no abdominal tenderness.     Comments: No guarding, no rebound.   Musculoskeletal:     Cervical back: Normal range of motion and neck supple.  Skin:    General: Skin is warm and dry.     Findings: Erythema present.     Comments: Left arm erythema with mild edema.   Neurological:     Mental Status: She is alert and oriented to person, place, and time.     ED Results / Procedures / Treatments   Labs (all labs ordered are listed, but only abnormal results are displayed) Labs Reviewed  COMPREHENSIVE METABOLIC PANEL WITH GFR - Abnormal; Notable for the following components:      Result Value   Sodium 131 (*)    Chloride 93 (*)    CO2 20 (*)    Glucose, Bld 288 (*)    Creatinine, Ser 1.11 (*)    GFR, Estimated 55 (*)    Anion gap 18 (*)    All other components within normal limits  CBC WITH DIFFERENTIAL/PLATELET - Abnormal; Notable for the following components:   Neutro Abs 7.8 (*)    All other components within normal limits  URINALYSIS, W/ REFLEX TO CULTURE (INFECTION SUSPECTED) - Abnormal; Notable for the following components:   Color, Urine AMBER (*)    Glucose, UA >=500 (*)    Hgb urine dipstick TRACE (*)    Bilirubin Urine SMALL (*)    Ketones, ur 15 (*)    Protein, ur 100 (*)    Leukocytes,Ua SMALL (*)    Bacteria, UA RARE (*)    All other components within normal limits  RESP PANEL BY RT-PCR (RSV, FLU A&B, COVID)  RVPGX2  CULTURE, BLOOD (ROUTINE X 2)  CULTURE, BLOOD (ROUTINE X 2)  LACTIC ACID, PLASMA  LACTIC ACID, PLASMA  PROTIME-INR    EKG None  Radiology DG Chest 2 View Result Date: 07/31/2023 CLINICAL DATA:  Fever and chills. EXAM: CHEST - 2 VIEW COMPARISON:  November 23, 2019 FINDINGS: The heart size and mediastinal contours are within normal limits. Low lung volumes are noted with mild elevation of the right hemidiaphragm. There is no evidence of acute infiltrate, pleural effusion or pneumothorax. Radiopaque surgical clips are seen overlying the lateral aspect of the upper left hemithorax.  The visualized skeletal structures are unremarkable. IMPRESSION: No active cardiopulmonary disease. Electronically Signed   By: Virgle Grime M.D.   On: 07/31/2023 18:46    Procedures .Critical Care  Performed by: Markeya Mincy, PA-C Authorized by: Marianita Botkin, PA-C   Critical care provider statement:    Critical care time (minutes):  30   Critical care start time:  07/31/2023 7:30 PM   Critical care end time:  07/31/2023 8:15 PM   Critical care was necessary to treat or prevent imminent or life-threatening deterioration of the following conditions:  Sepsis   Critical care was time spent personally by me on the following activities:  Development of treatment plan with patient or surrogate, discussions with consultants, evaluation of patient's response to treatment, examination of patient, ordering and review of laboratory studies, ordering and review of radiographic studies, ordering and performing treatments and interventions, pulse oximetry, re-evaluation of patient's condition and review of old charts   Care discussed with: admitting provider       Medications Ordered in ED Medications  lactated ringers  infusion (has no administration in time range)  lactated ringers  bolus 1,000 mL (0 mLs Intravenous Stopped 07/31/23 2126)    And  lactated ringers  bolus 1,000 mL (has no administration in time range)    And  lactated ringers  bolus 1,000 mL (has no administration in time range)    And  lactated ringers  bolus 1,000 mL (1,000 mLs Intravenous New Bag/Given 07/31/23 2126)  metroNIDAZOLE  (FLAGYL ) IVPB 500 mg (has no administration in time range)  vancomycin  (VANCOCIN ) IVPB  1000 mg/200 mL premix (0 mg Intravenous Stopped 07/31/23 2127)    Followed by  vancomycin  (VANCOCIN ) IVPB 1000 mg/200 mL premix (1,000 mg Intravenous New Bag/Given 07/31/23 2141)  acetaminophen  (TYLENOL ) tablet 1,000 mg (1,000 mg Oral Given 07/31/23 1846)  ceFEPIme  (MAXIPIME ) 2 g in sodium chloride  0.9 % 100 mL IVPB (0 g Intravenous Stopped 07/31/23 2006)    ED Course/ Medical Decision Making/ A&P                                 Medical Decision Making Amount and/or Complexity of Data Reviewed Labs: ordered. Radiology: ordered.  Risk OTC drugs. Prescription drug management.    This patient presents to the ED for concern of weakness, this involves a number of treatment options, and is a complaint that carries with it a high risk of complications and morbidity.  The differential diagnosis includes UTI, pneumonia versus viral illness.    Co morbidities: Discussed in HPI   Brief History:  See HPI  EMR reviewed including pt PMHx, past surgical history and past visits to ER.   See HPI for more details   Lab Tests:  I ordered and independently interpreted labs.  The pertinent results include:    CBC with no leukocytosis, hemoglobin is within normal limits.  CMP with slight decrease in sodium, current level slightly elevated from her baseline.  LFTs are within normal limits but she does have a gap of 18.  Influenza, COVID-19, RSV were all negative on today's visit.  Lactic acid was negative, PT and INR normal.  UA with no nitrites, small leukocytes, 11-20 white blood cell count.   Imaging Studies:  NAD. I personally reviewed all imaging studies and no acute abnormality found. I agree with radiology interpretation.  Cardiac Monitoring:  The patient was maintained on a cardiac monitor.  I personally viewed and interpreted the cardiac monitored  which showed an underlying rhythm of: Sinus tachycardia  EKG non-ischemic  Medicines ordered:  I ordered medication including Tylenol  for  pyrexia Reevaluation of the patient after these medicines showed that the patient improved I have reviewed the patients home medicines and have made adjustments as needed  Critical Interventions:  Code sepsis was activated with patient here with tachycardia, febrile, tachypneic.  Broad-spectrum antibiotics such as bank, cefepime  and where given.  Reevaluation:  After the interventions noted above I re-evaluated patient and found that they have :stayed the same  Social Determinants of Health:  The patient's social determinants of health were a factor in the care of this patient  Problem List / ED Course:  Patient presents to the ED with a chief complaint of weakness, reports she has felt bad for the last 5 days, has been running a fever at home, has not been taking anything for her symptoms.  She does not have any complaints, no sore throat, no cough, no shortness of breath, no abdominal pain, no nausea, no vomiting no diarrhea.  She does report there has been some lack of appetite over the last several days.  She has been taking her medication, but did not take any of it today.  On evaluation she is a very poor historian, and having a very hard time obtaining any kind of history from her.  She does feel warm to the touch and arrived to the ED with a temperature of 103, given 1000 mg of Tylenol  upon arrival.  She also had tachypnea, no hypoxia, was overall ill-appearing therefore code sepsis was activated. CBC with no leukocytosis, hemoglobin is within normal limits.  CMP with no electrolyte derangement, creatinine level slightly elevated, given 2 L bolus due to code sepsis.  LFTs are within normal limits.  Rapid flu, COVID, RSV is within normal limits.  PT and INR normal.  Some suspicion for urinary tract infection, however UA only shows small amount of leukocytes present.  She was given broad-spectrum antibiotic such as vancomycin , cefepime  and.  Upon reevaluation a couple of hours later, patient  does feel improvement in her symptoms, however she remains febrile, I do not know what the source is at this time.  Secondary evaluation was performed where there is warmth to her left arm, perhaps some redness noted over the left arm, she does report history of lymphedema but this arm appears to be erythematous versus at the right arm, there is also warmth present.  Her vitals have improved, her mental status has improved, however I do feel that patient meets criteria for admission at this time.  Will call hospitalist service in order to obtain a bed over at [facility].  Patient remains hemodynamically stable. Spoke to Dr. Del Favia who will admit patient for further management.   Dispostion:  After consideration of the diagnostic results and the patients response to treatment, I feel that the patent would benefit from admission for further management.     Portions of this note were generated with Scientist, clinical (histocompatibility and immunogenetics). Dictation errors may occur despite best attempts at proofreading.   Final Clinical Impression(s) / ED Diagnoses Final diagnoses:  Sepsis, due to unspecified organism, unspecified whether acute organ dysfunction present [facility])    Rx / DC Orders ED Discharge Orders     None         Luellen Sages, Kirby Peoples 07/31/23 2149    Wynetta Heckle, MD 08/01/23 2320

## 2023-07-31 NOTE — ED Notes (Signed)
 [facility] called for transport, No Current ETA ED Nurse has paper work [employer] @ 21:55

## 2023-07-31 NOTE — Sepsis Progress Note (Signed)
 Elink monitoring for the code sepsis protocol.

## 2023-07-31 NOTE — ED Notes (Signed)
 Pt denies pain at this moment. Complains of weakness

## 2023-08-01 ENCOUNTER — Observation Stay (HOSPITAL_COMMUNITY)

## 2023-08-01 DIAGNOSIS — R531 Weakness: Secondary | ICD-10-CM

## 2023-08-01 DIAGNOSIS — E8729 Other acidosis: Secondary | ICD-10-CM | POA: Diagnosis present

## 2023-08-01 DIAGNOSIS — R6520 Severe sepsis without septic shock: Secondary | ICD-10-CM | POA: Diagnosis present

## 2023-08-01 DIAGNOSIS — I059 Rheumatic mitral valve disease, unspecified: Secondary | ICD-10-CM | POA: Diagnosis present

## 2023-08-01 DIAGNOSIS — Z853 Personal history of malignant neoplasm of breast: Secondary | ICD-10-CM

## 2023-08-01 DIAGNOSIS — N179 Acute kidney failure, unspecified: Secondary | ICD-10-CM | POA: Diagnosis present

## 2023-08-01 DIAGNOSIS — M7989 Other specified soft tissue disorders: Secondary | ICD-10-CM

## 2023-08-01 DIAGNOSIS — A419 Sepsis, unspecified organism: Secondary | ICD-10-CM

## 2023-08-01 DIAGNOSIS — L03114 Cellulitis of left upper limb: Secondary | ICD-10-CM | POA: Diagnosis present

## 2023-08-01 DIAGNOSIS — E860 Dehydration: Secondary | ICD-10-CM | POA: Diagnosis present

## 2023-08-01 DIAGNOSIS — F411 Generalized anxiety disorder: Secondary | ICD-10-CM | POA: Diagnosis present

## 2023-08-01 DIAGNOSIS — E66813 Obesity, class 3: Secondary | ICD-10-CM | POA: Insufficient documentation

## 2023-08-01 HISTORY — DX: Severe sepsis without septic shock: A41.9

## 2023-08-01 LAB — RAPID URINE DRUG SCREEN, HOSP PERFORMED
Amphetamines: NOT DETECTED
Barbiturates: NOT DETECTED
Benzodiazepines: NOT DETECTED
Cocaine: NOT DETECTED
Opiates: NOT DETECTED
Tetrahydrocannabinol: NOT DETECTED

## 2023-08-01 LAB — CBC WITH DIFFERENTIAL/PLATELET
Abs Immature Granulocytes: 0.05 10*3/uL (ref 0.00–0.07)
Basophils Absolute: 0 10*3/uL (ref 0.0–0.1)
Basophils Relative: 0 %
Eosinophils Absolute: 0 10*3/uL (ref 0.0–0.5)
Eosinophils Relative: 0 %
HCT: 35.3 % — ABNORMAL LOW (ref 36.0–46.0)
Hemoglobin: 11.7 g/dL — ABNORMAL LOW (ref 12.0–15.0)
Immature Granulocytes: 1 %
Lymphocytes Relative: 14 %
Lymphs Abs: 1.1 10*3/uL (ref 0.7–4.0)
MCH: 28.5 pg (ref 26.0–34.0)
MCHC: 33.1 g/dL (ref 30.0–36.0)
MCV: 86.1 fL (ref 80.0–100.0)
Monocytes Absolute: 1 10*3/uL (ref 0.1–1.0)
Monocytes Relative: 13 %
Neutro Abs: 5.7 10*3/uL (ref 1.7–7.7)
Neutrophils Relative %: 72 %
Platelets: 139 10*3/uL — ABNORMAL LOW (ref 150–400)
RBC: 4.1 MIL/uL (ref 3.87–5.11)
RDW: 12.9 % (ref 11.5–15.5)
WBC: 7.9 10*3/uL (ref 4.0–10.5)
nRBC: 0 % (ref 0.0–0.2)

## 2023-08-01 LAB — MAGNESIUM: Magnesium: 1.5 mg/dL — ABNORMAL LOW (ref 1.7–2.4)

## 2023-08-01 LAB — BLOOD GAS, VENOUS
Acid-Base Excess: 3.5 mmol/L — ABNORMAL HIGH (ref 0.0–2.0)
Bicarbonate: 28.5 mmol/L — ABNORMAL HIGH (ref 20.0–28.0)
Drawn by: 71375
O2 Saturation: 49.6 %
Patient temperature: 36.7
pCO2, Ven: 43 mmHg — ABNORMAL LOW (ref 44–60)
pH, Ven: 7.42 (ref 7.25–7.43)
pO2, Ven: <31 mmHg — CL (ref 32–45)

## 2023-08-01 LAB — COMPREHENSIVE METABOLIC PANEL WITH GFR
ALT: 22 U/L (ref 0–44)
AST: 20 U/L (ref 15–41)
Albumin: 2.7 g/dL — ABNORMAL LOW (ref 3.5–5.0)
Alkaline Phosphatase: 50 U/L (ref 38–126)
Anion gap: 9 (ref 5–15)
BUN: 11 mg/dL (ref 8–23)
CO2: 24 mmol/L (ref 22–32)
Calcium: 8.6 mg/dL — ABNORMAL LOW (ref 8.9–10.3)
Chloride: 99 mmol/L (ref 98–111)
Creatinine, Ser: 0.79 mg/dL (ref 0.44–1.00)
GFR, Estimated: >60 mL/min (ref >60)
Glucose, Bld: 236 mg/dL — ABNORMAL HIGH (ref 70–99)
Potassium: 3.2 mmol/L — ABNORMAL LOW (ref 3.5–5.1)
Sodium: 132 mmol/L — ABNORMAL LOW (ref 135–145)
Total Bilirubin: 0.7 mg/dL (ref 0.0–1.2)
Total Protein: 6.5 g/dL (ref 6.5–8.1)

## 2023-08-01 LAB — BLOOD CULTURE ID PANEL (REFLEXED) - BCID2

## 2023-08-01 LAB — TSH: TSH: 2.47 u[IU]/mL (ref 0.350–4.500)

## 2023-08-01 LAB — PROCALCITONIN: Procalcitonin: 1.12 ng/mL

## 2023-08-01 LAB — GLUCOSE, CAPILLARY
Glucose-Capillary: 261 mg/dL — ABNORMAL HIGH (ref 70–99)
Glucose-Capillary: 214 mg/dL — ABNORMAL HIGH (ref 70–99)
Glucose-Capillary: 279 mg/dL — ABNORMAL HIGH (ref 70–99)
Glucose-Capillary: 228 mg/dL — ABNORMAL HIGH (ref 70–99)
Glucose-Capillary: 234 mg/dL — ABNORMAL HIGH (ref 70–99)

## 2023-08-01 LAB — SALICYLATE LEVEL: Salicylate Lvl: <7 mg/dL — ABNORMAL LOW (ref 7.0–30.0)

## 2023-08-01 LAB — VITAMIN B12: Vitamin B-12: 483 pg/mL (ref 180–914)

## 2023-08-01 LAB — CREATININE, URINE, RANDOM: Creatinine, Urine: 86 mg/dL

## 2023-08-01 LAB — BETA-HYDROXYBUTYRIC ACID: Beta-Hydroxybutyric Acid: 0.69 mmol/L — ABNORMAL HIGH (ref 0.05–0.27)

## 2023-08-01 LAB — SODIUM, URINE, RANDOM: Sodium, Ur: 20 mmol/L

## 2023-08-01 MED ORDER — DULOXETINE HCL 30 MG PO CPEP
30.0000 mg | ORAL_CAPSULE | Freq: Every day | ORAL | Status: DC
  Administered 2023-08-01 – 2023-08-07 (×6): 30 mg via ORAL
  Filled 2023-08-01 (×6): qty 1

## 2023-08-01 MED ORDER — POTASSIUM CHLORIDE CRYS ER 20 MEQ PO TBCR
40.0000 meq | EXTENDED_RELEASE_TABLET | Freq: Two times a day (BID) | ORAL | Status: AC
  Administered 2023-08-01 (×2): 40 meq via ORAL
  Filled 2023-08-01 (×2): qty 2

## 2023-08-01 MED ORDER — VANCOMYCIN HCL 1250 MG/250ML IV SOLN: 1250.0000 mg | INTRAVENOUS | Status: DC

## 2023-08-01 MED ORDER — SODIUM CHLORIDE 0.9 % IV SOLN
1.0000 g | INTRAVENOUS | Status: DC
  Administered 2023-08-01: 1 g via INTRAVENOUS
  Filled 2023-08-01: qty 10

## 2023-08-01 MED ORDER — PRAVASTATIN SODIUM 40 MG PO TABS
20.0000 mg | ORAL_TABLET | Freq: Every day | ORAL | Status: DC
  Administered 2023-08-01 – 2023-08-07 (×6): 20 mg via ORAL
  Filled 2023-08-01 (×6): qty 1

## 2023-08-01 MED ORDER — ANASTROZOLE 1 MG PO TABS
1.0000 mg | ORAL_TABLET | Freq: Every day | ORAL | Status: DC
  Administered 2023-08-01 – 2023-08-07 (×6): 1 mg via ORAL
  Filled 2023-08-01 (×8): qty 1

## 2023-08-01 MED ORDER — CEFAZOLIN SODIUM-DEXTROSE 2-4 GM/100ML-% IV SOLN: 2.0000 g | Freq: Three times a day (TID) | INTRAVENOUS | Status: DC

## 2023-08-01 MED ORDER — MAGNESIUM SULFATE 2 GM/50ML IV SOLN
2.0000 g | Freq: Once | INTRAVENOUS | Status: AC
  Administered 2023-08-01: 2 g via INTRAVENOUS
  Filled 2023-08-01: qty 50

## 2023-08-01 MED ORDER — CEFAZOLIN SODIUM-DEXTROSE 2-4 GM/100ML-% IV SOLN
2.0000 g | Freq: Three times a day (TID) | INTRAVENOUS | Status: DC
  Administered 2023-08-01 – 2023-08-04 (×9): 2 g via INTRAVENOUS
  Filled 2023-08-01 (×9): qty 100

## 2023-08-01 NOTE — Progress Notes (Signed)
 This encounter was created in error - please disregard.

## 2023-08-01 NOTE — Inpatient Diabetes Management (Signed)
 Inpatient Diabetes Program Recommendations  AACE/ADA: New Consensus Statement on Inpatient Glycemic Control (2015)  Target Ranges:  Prepandial:   less than 140 mg/dL      Peak postprandial:   less than 180 mg/dL (1-2 hours)      Critically ill patients:  140 - 180 mg/dL   Lab Results  Component Value Date   GLUCAP 279 (H) 08/01/2023   HGBA1C 12.8 (H) 07/31/2023    Review of Glycemic Control  Diabetes history: DM2 Outpatient Diabetes medications: Lantus  20 daily, glipizide  10 mg daily, metformin  850 BID Current orders for Inpatient glycemic control: Lantus  15 at bedtime, Novolog  0-9 TID with meals and 0-5 HS  HgbA1C - 12.8%  Inpatient Diabetes Program Recommendations:    Consider increasing Lantus  to 20 units at bedtime  Consider adding Novolog  4 units TID with meals if eating > 50%  Spoke with pt at bedside regarding her diabetes control and HgbA1C of 12.8%. Pt states she has not been checking her blood sugars and needs to f/u with PCP. Discussed glucose and A1C goals. Discussed importance of checking CBGs and maintaining good CBG control to prevent long-term and short-term complications. Explained how hyperglycemia leads to damage within blood vessels which lead to the common complications seen with uncontrolled diabetes. Stressed to the patient the importance of improving glycemic control to prevent further complications from uncontrolled diabetes. Discussed impact of nutrition, exercise, stress, sickness, and medications on diabetes control.  Discussed carbohydrates, carbohydrate goals per day and meal, along with portion sizes.  Also discussed hypoglycemia s/s and treatment. Pt knows she needs to start taking care of herself to feel better and have more energy.   Continue to follow.  Thank you. Joni Net, RD, LDN, CDCES Inpatient Diabetes Coordinator [phone]

## 2023-08-01 NOTE — Progress Notes (Signed)
   08/01/23 1132  TOC Brief Assessment  Insurance and Status Reviewed  Patient has primary care physician Yes  Home environment has been reviewed Single family home  Prior level of function: Independent  Prior/Current Home Services No current home services  Readmission risk has been reviewed Yes (N/A)  Transition of care needs no transition of care needs at this time   Pt screened, no other recommendations, no TOC needs at this time.

## 2023-08-01 NOTE — Evaluation (Signed)
 Physical Therapy Evaluation Patient Details Name: Melissa Bray MRN: [id number] DOB: 1960/10/23 Today's Date: 08/01/2023  History of Present Illness  63 y.o. female who is admitted to [facility] on 07/31/2023 by way of transfer from [facility] with suspected severe sepsis due to left upper extremity cellulitis after presenting from home to the latter facility complaining of generalized weakness. with medical history significant for type 2 diabetes mellitus, essential hypertension, hyperlipidemia, breast cancer status post left mastectomy in 2016.  Clinical Impression  Pt is mobilizing well at an independent level. She ambulated 280' without an assistive device, no loss of balance. She is safe to ambulate in the hall independently and was encouraged to do so at least TID to minimize deconditioning during hospitalization. No further PT indicated, will sign off.         If plan is discharge home, recommend the following:     Can travel by private vehicle        Equipment Recommendations None recommended by PT  Recommendations for Other Services       Functional Status Assessment Patient has not had a recent decline in their functional status     Precautions / Restrictions Precautions Precautions: None Recall of Precautions/Restrictions: Intact Restrictions Weight Bearing Restrictions Per Provider Order: No      Mobility  Bed Mobility Overal bed mobility: Modified Independent             General bed mobility comments: HOB up, used rail    Transfers Overall transfer level: Independent                      Ambulation/Gait Ambulation/Gait assistance: Independent Gait Distance (Feet): 280 Feet Assistive device: None Gait Pattern/deviations: WFL(Within Functional Limits) Gait velocity: WNL     General Gait Details: steady, no loss of balance, no pain  Stairs            Wheelchair Mobility     Tilt Bed    Modified Rankin (Stroke  Patients Only)       Balance Overall balance assessment: Independent                                           Pertinent Vitals/Pain Pain Assessment Pain Assessment: No/denies pain    Home Living Family/patient expects to be discharged to:: Private residence Living Arrangements: Children Available Help at Discharge: Family;Available 24 hours/day   Home Access: Stairs to enter   Entrance Stairs-Number of Steps: 2   Home Layout: One level Home Equipment: None      Prior Function Prior Level of Function : Independent/Modified Independent             Mobility Comments: independent without AD, no falls in past 6 months but reports balance has been off for ~5 days; 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: independent     Extremity/Trunk Assessment   Upper Extremity Assessment Upper Extremity Assessment: LUE deficits/detail LUE Deficits / Details: edema noted LUE, shoulder/elbow/hand AROM WFL    Lower Extremity Assessment Lower Extremity Assessment: Overall WFL for tasks assessed    Cervical / Trunk Assessment Cervical / Trunk Assessment: Normal  Communication   Communication Communication: No apparent difficulties    Cognition Arousal: Alert Behavior During Therapy: WFL for tasks assessed/performed   PT - Cognitive impairments: No apparent impairments  Following commands: Intact       Cueing       General Comments      Exercises     Assessment/Plan    PT Assessment Patient does not need any further PT services  PT Problem List         PT Treatment Interventions      PT Goals (Current goals can be found in the Care Plan section)  Acute Rehab PT Goals Patient Stated Goal: return to work at [employer] PT Goal Formulation: All assessment and education complete, DC therapy    Frequency       Co-evaluation               AM-PAC PT "6 Clicks" Mobility  Outcome Measure Help needed turning from  your back to your side while in a flat bed without using bedrails?: None Help needed moving from lying on your back to sitting on the side of a flat bed without using bedrails?: None Help needed moving to and from a bed to a chair (including a wheelchair)?: None Help needed standing up from a chair using your arms (e.g., wheelchair or bedside chair)?: None Help needed to walk in hospital room?: None Help needed climbing 3-5 steps with a railing? : None 6 Click Score: 24    End of Session   Activity Tolerance: Patient tolerated treatment well Patient left: in chair;with call bell/phone within reach;with family/visitor present Nurse Communication: Mobility status      Time: 6045-4098 PT Time Calculation (min) (ACUTE ONLY): 10 min   Charges:   PT Evaluation $PT Eval Low Complexity: 1 Low   PT General Charges $$ ACUTE PT VISIT: 1 Visit         Daymon Evans PT 08/01/2023  Acute Rehabilitation Services  Office [phone]

## 2023-08-01 NOTE — Progress Notes (Signed)
 PROGRESS NOTE  Melissa Bray    DOB: 16-Sep-1960, 63 y.o.  WUJ:[id number]    Code Status: Full Code   DOA: 07/31/2023   LOS: 0   Brief hospital course  Melissa Bray is a 63 y.o. female with medical history significant for type 2 diabetes mellitus, essential hypertension, hyperlipidemia, breast cancer status post left mastectomy in 2016, who is admitted to [facility] on 5/1 with suspected severe sepsis due to left upper extremity cellulitis.   ED Course: Temperature max 103.2; mild tachycardia, initial blood pressure 95/65, with ensuing increase in systolic blood pressures into the 1 teens 120s following interval IV fluids; respiratory rate 19-24, oxygen saturation 94 to 98% on room air.  08/01/23 -symptomatically improved. Blood cultures positive for streptococcus agalactiae  Assessment & Plan  Principal Problem:   Severe sepsis (HCC) Active Problems:   Essential hypertension   DM2 (diabetes mellitus, type 2) (HCC)   HLD (hyperlipidemia)   Cellulitis of left upper extremity   Generalized weakness   High anion gap metabolic acidosis   AKI (acute kidney injury) (HCC)   Dehydration   GAD (generalized anxiety disorder)   History of breast cancer  Severe sepsis due to left upper extremity cellulitis: symptomatically improved. Blood cultures positive for streptococcus agalactiae - f/u DVT evaluation - awaiting culture sensitivities - continue cefazolin    #) history of left-sided breast cancer: Status post left mastectomy in 2016.  On Arimidex . Contributing to lymphedema     #) Generalized weakness:  - PT/OT - treat underlying causes   #) Acute Kidney Injury: ruled out. Cr at baseline  #) Type 2 Diabetes Mellitus: - accuchecks QAC and HS with low dose SSI,  Lantus  15 units SQ nightly, first dose now . hold home oral hypoglycemic agents during this hospitalization.    #) Essential Hypertension: outpatient antihypertensive regimen including oral clonidine , amlodipine ,  metoprolol  succinate, lisinopril .  Initially hypotensive in setting of sepsis - titrate back on home regimen as tolerated and needed   #) Hyperlipidemia: - continue home statin.    #) Generalized anxiety disorder:   Resume home Cymbalta .     Plan: Continue Arimidex .  Body mass index is 45.09 kg/m.  VTE ppx: SCDs Start: 07/31/23 2321  Diet:     Diet   Diet regular Room service appropriate? Yes; Fluid consistency: Thin   Consultants: None   Subjective 08/01/23    Pt reports feeling better today. Less redness and pain in left arm.    Objective   Vitals:   07/31/23 2311 07/31/23 2327 08/01/23 0253 08/01/23 0500  BP:  115/75 119/66   Pulse:  95 93   Resp:  19 18   Temp:  98.1 F (36.7 C) 98 F (36.7 C)   TempSrc:      SpO2:  98% 97%   Weight: 120.7 kg   122.9 kg  Height: 5\' 5"  (1.651 m)       Intake/Output Summary (Last 24 hours) at 08/01/2023 0741 Last data filed at 07/31/2023 2338 Gross per 24 hour  Intake 2540.27 ml  Output --  Net 2540.27 ml   Filed Weights   07/31/23 1727 07/31/23 2311 08/01/23 0500  Weight: 117.9 kg 120.7 kg 122.9 kg     Physical Exam:  General: awake, alert, NAD Respiratory: normal respiratory effort. Cardiovascular: quick capillary refill Nervous: A&O x3. no gross focal neurologic deficits, normal speech Extremities: non-pitting edema of left arm. No rash or erythema noticeable  Skin: dry, intact, normal temperature, normal color. No  rashes, lesions or ulcers on exposed skin Psychiatry: normal mood, congruent affect  Labs   I have personally reviewed the following labs and imaging studies CBC    Component Value Date/Time   WBC 7.9 08/01/2023 0537   RBC 4.10 08/01/2023 0537   HGB 11.7 (L) 08/01/2023 0537   HCT 35.3 (L) 08/01/2023 0537   PLT 139 (L) 08/01/2023 0537   MCV 86.1 08/01/2023 0537   MCH 28.5 08/01/2023 0537   MCHC 33.1 08/01/2023 0537   RDW 12.9 08/01/2023 0537   LYMPHSABS 1.1 08/01/2023 0537   MONOABS 1.0  08/01/2023 0537   EOSABS 0.0 08/01/2023 0537   BASOSABS 0.0 08/01/2023 0537      Latest Ref Rng & Units 08/01/2023    5:37 AM 07/31/2023    5:34 PM 07/31/2023    8:09 AM  BMP  Glucose 70 - 99 mg/dL 161  096  045   BUN 8 - 23 mg/dL 11  13  13    Creatinine 0.44 - 1.00 mg/dL 4.09  8.11  9.14   Sodium 135 - 145 mmol/L 132  131  131   Potassium 3.5 - 5.1 mmol/L 3.2  3.6  3.6   Chloride 98 - 111 mmol/L 99  93  95   CO2 22 - 32 mmol/L 24  20  26    Calcium 8.9 - 10.3 mg/dL 8.6  9.6  9.0     DG Chest 2 View Result Date: 07/31/2023 CLINICAL DATA:  Fever and chills. EXAM: CHEST - 2 VIEW COMPARISON:  November 23, 2019 FINDINGS: The heart size and mediastinal contours are within normal limits. Low lung volumes are noted with mild elevation of the right hemidiaphragm. There is no evidence of acute infiltrate, pleural effusion or pneumothorax. Radiopaque surgical clips are seen overlying the lateral aspect of the upper left hemithorax. The visualized skeletal structures are unremarkable. IMPRESSION: No active cardiopulmonary disease. Electronically Signed   By: Virgle Grime M.D.   On: 07/31/2023 18:46    Disposition Plan & Communication  Patient status: Observation  Admitted From: Home Planned disposition location: Home Anticipated discharge date: 5/3 pending culture results and doppler results  Family Communication: none at bedside    Author: Ree Candy, DO [facility] 08/01/2023, 7:41 AM   Available by Epic secure chat 7AM-7PM. If 7PM-7AM, please contact night-coverage.  [facility] contact information found on [email or web address].

## 2023-08-01 NOTE — Progress Notes (Signed)
 Pharmacy Antibiotic Note  Mabel Wilger is a 63 y.o. female admitted on 07/31/2023 with development of left upper extremity erythema, swelling, increased warmth, tenderness in distribution just proximal to the left elbow .  Pharmacy has been consulted for vancomycin  dosing.  1st dose given in the ED  Plan: Vancomycin  1250mg  IV q24h (AUC 485.7, Scr 1.11) Follow renal function, cultures and clinical course  Height: 5\' 5"  (165.1 cm) Weight: 120.7 kg (266 lb 1.5 oz) IBW/kg (Calculated) : 57  Temp (24hrs), Avg:100.8 F (38.2 C), Min:98.1 F (36.7 C), Max:103.2 F (39.6 C)  Recent Labs  Lab 07/31/23 0809 07/31/23 1734 07/31/23 1848 07/31/23 2102  WBC  --  9.7  --   --   CREATININE 0.99 1.11*  --   --   LATICACIDVEN  --   --  1.4 1.3    Estimated Creatinine Clearance: 66.7 mL/min (A) (by C-G formula based on SCr of 1.11 mg/dL (H)).    No Known Allergies  Antimicrobials this admission: 5/1 cefepime  >> 5/2 CTX >> 5/1 flagyl  x 1 5/1 vanc >>  Dose adjustments this admission:   Microbiology results: 5/1 BCx:  5/1 UCx:   Thank you for allowing pharmacy to be a part of this patient's care.  Beau Bound RPh 08/01/2023, 12:37 AM

## 2023-08-01 NOTE — Evaluation (Signed)
 Occupational Therapy Evaluation Patient Details Name: Talithia Ercole MRN: [id number] DOB: 1960-08-24 Today's Date: 08/01/2023   History of Present Illness   63 y.o. female who is admitted to [facility] on 07/31/2023 by way of transfer from [facility] with suspected severe sepsis due to left upper extremity cellulitis after presenting from home to the latter facility complaining of generalized weakness. with medical history significant for type 2 diabetes mellitus, essential hypertension, hyperlipidemia, breast cancer status post left mastectomy in 2016.     Clinical Impressions Patient evaluated by Occupational Therapy with no further acute OT needs identified. All education has been completed and the patient has no further questions. ECT Handouts provided with education and patient teach back. Simple UE HEP issued and trained.  See below for any follow-up Occupational Therapy or equipment needs. OT is signing off. Thank you for this referral.      If plan is discharge home, recommend the following:   Assist for transportation     Functional Status Assessment   Patient has not had a recent decline in their functional status     Equipment Recommendations   Other (comment) (info provided should patient want to order a shower seat on her own)      Precautions/Restrictions   Precautions Precautions: None Recall of Precautions/Restrictions: Intact Restrictions Weight Bearing Restrictions Per Provider Order: No     Mobility Bed Mobility Overal bed mobility: Modified Independent                  Transfers Overall transfer level: Independent                        Balance Overall balance assessment: Independent                                         ADL either performed or assessed with clinical judgement   ADL Overall ADL's : Independent                                       General ADL  Comments: education provided for ECTs and if interested in shower seat     Vision Baseline Vision/History: 1 Wears glasses Ability to See in Adequate Light: 0 Adequate Patient Visual Report: No change from baseline Vision Assessment?: Wears glasses for reading;No apparent visual deficits     Perception Perception: Within Functional Limits       Praxis Praxis: WFL       Pertinent Vitals/Pain Pain Assessment Pain Assessment: No/denies pain     Extremity/Trunk Assessment Upper Extremity Assessment Upper Extremity Assessment: Right hand dominant LUE Deficits / Details: edema noted LUE, shoulder/elbow/hand AROM WFL   Lower Extremity Assessment Lower Extremity Assessment: Overall WFL for tasks assessed   Cervical / Trunk Assessment Cervical / Trunk Assessment: Normal   Communication Communication Communication: No apparent difficulties   Cognition Arousal: Alert Behavior During Therapy: WFL for tasks assessed/performed Cognition: No apparent impairments                               Following commands: Intact       Cueing  General Comments      L UE edema, issued and trained in ECT and  light therex for edema management and strengthening   Exercises Exercises: Hand exercises Hand Exercises Digit Composite Flexion: Both, 20 reps, Hand exerciser    Home Living Family/patient expects to be discharged to:: Private residence Living Arrangements: Children Available Help at Discharge: Family;Available 24 hours/day   Home Access: Stairs to enter Entrance Stairs-Number of Steps: 2   Home Layout: One level     Bathroom Shower/Tub: Tub/shower unit;Curtain         Home Equipment: None   Additional Comments: may consider purchasing a shower chair      Prior Functioning/Environment Prior Level of Function : Independent/Modified Independent             Mobility Comments: independent without AD, no falls in past 6 months but reports balance  has been off for ~5 days; 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: independent            OT Goals(Current goals can be found in the care plan section)   Acute Rehab OT Goals Patient Stated Goal: to go home stronger   AM-PAC OT "6 Clicks" Daily Activity     Outcome Measure Help from another person eating meals?: None Help from another person taking care of personal grooming?: None Help from another person toileting, which includes using toliet, bedpan, or urinal?: None Help from another person bathing (including washing, rinsing, drying)?: None Help from another person to put on and taking off regular upper body clothing?: None Help from another person to put on and taking off regular lower body clothing?: None 6 Click Score: 24   End of Session Equipment Utilized During Treatment: Gait belt Nurse Communication: Mobility status  Activity Tolerance: Patient tolerated treatment well Patient left: in bed;with call bell/phone within reach;with bed alarm set                   Time: 1140-1214 OT Time Calculation (min): 34 min Charges:  OT General Charges $OT Visit: 1 Visit OT Evaluation $OT Eval Low Complexity: 1 Low OT Treatments $Therapeutic Activity: 8-22 mins  Amous Crewe OT/L Acute Rehabilitation Department  [phone]  08/01/2023, 12:54 PM

## 2023-08-01 NOTE — Plan of Care (Addendum)
 VSS. No c/o pain. BG 333 at bedtime and 261 after insulin  administration. Patient ambulating to bathroom throughout shift. Patient with LUE edema and redness; order placed for LUE Doppler to rule out DVT. Blood gas pO2 <31 (provider aware). SCDs in place bilaterally. No acute events overnight.  Problem: Education: Goal: Knowledge of General Education information will improve Description: Including pain rating scale, medication(s)/side effects and non-pharmacologic comfort measures Outcome: Progressing   Problem: Health Behavior/Discharge Planning: Goal: Ability to manage health-related needs will improve Outcome: Progressing   Problem: Clinical Measurements: Goal: Will remain free from infection Outcome: Progressing Goal: Cardiovascular complication will be avoided Outcome: Progressing   Problem: Safety: Goal: Ability to remain free from injury will improve Outcome: Progressing   Problem: Fluid Volume: Goal: Hemodynamic stability will improve Outcome: Progressing   Problem: Clinical Measurements: Goal: Signs and symptoms of infection will decrease Outcome: Progressing   Problem: Clinical Measurements: Goal: Ability to avoid or minimize complications of infection will improve Outcome: Progressing   Problem: Skin Integrity: Goal: Skin integrity will improve Outcome: Progressing

## 2023-08-01 NOTE — Progress Notes (Signed)
 PHARMACY - PHYSICIAN COMMUNICATION CRITICAL VALUE ALERT - BLOOD CULTURE IDENTIFICATION (BCID)  Melissa Bray is an 63 y.o. female who presented to [facility] on 07/31/2023 with a chief complaint of  Chief Complaint  Patient presents with   Fatigue     Assessment:   4/4 strep agalactiae/grp B strep, no resistance   Name of physician (or Provider) Contacted: Dr. Alva Jewels  Current antibiotics: vancomycin  and ceftriaxone   Changes to prescribed antibiotics recommended:  - narrow to cefazolin  2 gr IV q8h   Results for orders placed or performed during the hospital encounter of 07/31/23  Blood Culture ID Panel (Reflexed) (Collected: 07/31/2023  7:01 PM)  Result Value Ref Range   Enterococcus faecalis NOT DETECTED NOT DETECTED   Enterococcus Faecium NOT DETECTED NOT DETECTED   Listeria monocytogenes NOT DETECTED NOT DETECTED   Staphylococcus species NOT DETECTED NOT DETECTED   Staphylococcus aureus (BCID) NOT DETECTED NOT DETECTED   Staphylococcus epidermidis NOT DETECTED NOT DETECTED   Staphylococcus lugdunensis NOT DETECTED NOT DETECTED   Streptococcus species DETECTED (A) NOT DETECTED   Streptococcus agalactiae DETECTED (A) NOT DETECTED   Streptococcus pneumoniae NOT DETECTED NOT DETECTED   Streptococcus pyogenes NOT DETECTED NOT DETECTED   A.calcoaceticus-baumannii NOT DETECTED NOT DETECTED   Bacteroides fragilis NOT DETECTED NOT DETECTED   Enterobacterales NOT DETECTED NOT DETECTED   Enterobacter cloacae complex NOT DETECTED NOT DETECTED   Escherichia coli NOT DETECTED NOT DETECTED   Klebsiella aerogenes NOT DETECTED NOT DETECTED   Klebsiella oxytoca NOT DETECTED NOT DETECTED   Klebsiella pneumoniae NOT DETECTED NOT DETECTED   Proteus species NOT DETECTED NOT DETECTED   Salmonella species NOT DETECTED NOT DETECTED   Serratia marcescens NOT DETECTED NOT DETECTED   Haemophilus influenzae NOT DETECTED NOT DETECTED   Neisseria meningitidis NOT DETECTED NOT DETECTED   Pseudomonas  aeruginosa NOT DETECTED NOT DETECTED   Stenotrophomonas maltophilia NOT DETECTED NOT DETECTED   Candida albicans NOT DETECTED NOT DETECTED   Candida auris NOT DETECTED NOT DETECTED   Candida glabrata NOT DETECTED NOT DETECTED   Candida krusei NOT DETECTED NOT DETECTED   Candida parapsilosis NOT DETECTED NOT DETECTED   Candida tropicalis NOT DETECTED NOT DETECTED   Cryptococcus neoformans/gattii NOT DETECTED NOT DETECTED    Van Gelinas, PharmD, BCPS 08/01/2023 8:34 AM

## 2023-08-01 NOTE — Progress Notes (Signed)
 Upper extremity venous duplex completed. Please see CV Procedures for preliminary results.  Estanislao Heimlich, RVT 08/01/23 1:27 PM

## 2023-08-02 DIAGNOSIS — R5381 Other malaise: Secondary | ICD-10-CM | POA: Diagnosis not present

## 2023-08-02 DIAGNOSIS — I272 Pulmonary hypertension, unspecified: Secondary | ICD-10-CM | POA: Diagnosis not present

## 2023-08-02 DIAGNOSIS — I059 Rheumatic mitral valve disease, unspecified: Secondary | ICD-10-CM | POA: Diagnosis not present

## 2023-08-02 DIAGNOSIS — Z1152 Encounter for screening for COVID-19: Secondary | ICD-10-CM | POA: Diagnosis not present

## 2023-08-02 DIAGNOSIS — I5033 Acute on chronic diastolic (congestive) heart failure: Secondary | ICD-10-CM | POA: Diagnosis not present

## 2023-08-02 DIAGNOSIS — E871 Hypo-osmolality and hyponatremia: Secondary | ICD-10-CM | POA: Diagnosis not present

## 2023-08-02 DIAGNOSIS — R739 Hyperglycemia, unspecified: Secondary | ICD-10-CM | POA: Diagnosis not present

## 2023-08-02 DIAGNOSIS — I330 Acute and subacute infective endocarditis: Secondary | ICD-10-CM | POA: Diagnosis present

## 2023-08-02 DIAGNOSIS — B951 Streptococcus, group B, as the cause of diseases classified elsewhere: Secondary | ICD-10-CM | POA: Diagnosis not present

## 2023-08-02 DIAGNOSIS — I340 Nonrheumatic mitral (valve) insufficiency: Secondary | ICD-10-CM | POA: Diagnosis not present

## 2023-08-02 DIAGNOSIS — Z794 Long term (current) use of insulin: Secondary | ICD-10-CM | POA: Diagnosis not present

## 2023-08-02 DIAGNOSIS — J9811 Atelectasis: Secondary | ICD-10-CM | POA: Diagnosis not present

## 2023-08-02 DIAGNOSIS — I5021 Acute systolic (congestive) heart failure: Secondary | ICD-10-CM | POA: Diagnosis not present

## 2023-08-02 DIAGNOSIS — J9602 Acute respiratory failure with hypercapnia: Secondary | ICD-10-CM | POA: Diagnosis not present

## 2023-08-02 DIAGNOSIS — I110 Hypertensive heart disease with heart failure: Secondary | ICD-10-CM | POA: Diagnosis not present

## 2023-08-02 DIAGNOSIS — E440 Moderate protein-calorie malnutrition: Secondary | ICD-10-CM | POA: Diagnosis present

## 2023-08-02 DIAGNOSIS — T8131XA Disruption of external operation (surgical) wound, not elsewhere classified, initial encounter: Secondary | ICD-10-CM | POA: Diagnosis not present

## 2023-08-02 DIAGNOSIS — A419 Sepsis, unspecified organism: Secondary | ICD-10-CM | POA: Diagnosis not present

## 2023-08-02 DIAGNOSIS — E66813 Obesity, class 3: Secondary | ICD-10-CM | POA: Diagnosis present

## 2023-08-02 DIAGNOSIS — R6520 Severe sepsis without septic shock: Secondary | ICD-10-CM | POA: Diagnosis present

## 2023-08-02 DIAGNOSIS — R570 Cardiogenic shock: Secondary | ICD-10-CM | POA: Diagnosis not present

## 2023-08-02 DIAGNOSIS — E119 Type 2 diabetes mellitus without complications: Secondary | ICD-10-CM | POA: Diagnosis not present

## 2023-08-02 DIAGNOSIS — E872 Acidosis, unspecified: Secondary | ICD-10-CM | POA: Diagnosis present

## 2023-08-02 DIAGNOSIS — J810 Acute pulmonary edema: Secondary | ICD-10-CM | POA: Diagnosis not present

## 2023-08-02 DIAGNOSIS — A401 Sepsis due to streptococcus, group B: Secondary | ICD-10-CM | POA: Diagnosis present

## 2023-08-02 DIAGNOSIS — Y838 Other surgical procedures as the cause of abnormal reaction of the patient, or of later complication, without mention of misadventure at the time of the procedure: Secondary | ICD-10-CM | POA: Diagnosis not present

## 2023-08-02 DIAGNOSIS — R011 Cardiac murmur, unspecified: Secondary | ICD-10-CM | POA: Diagnosis not present

## 2023-08-02 DIAGNOSIS — E1165 Type 2 diabetes mellitus with hyperglycemia: Secondary | ICD-10-CM | POA: Diagnosis present

## 2023-08-02 DIAGNOSIS — L03114 Cellulitis of left upper limb: Secondary | ICD-10-CM | POA: Diagnosis present

## 2023-08-02 DIAGNOSIS — R579 Shock, unspecified: Secondary | ICD-10-CM | POA: Diagnosis not present

## 2023-08-02 DIAGNOSIS — Z6841 Body Mass Index (BMI) 40.0 and over, adult: Secondary | ICD-10-CM | POA: Diagnosis not present

## 2023-08-02 DIAGNOSIS — D696 Thrombocytopenia, unspecified: Secondary | ICD-10-CM | POA: Diagnosis present

## 2023-08-02 DIAGNOSIS — I5031 Acute diastolic (congestive) heart failure: Secondary | ICD-10-CM | POA: Diagnosis not present

## 2023-08-02 DIAGNOSIS — R6510 Systemic inflammatory response syndrome (SIRS) of non-infectious origin without acute organ dysfunction: Secondary | ICD-10-CM | POA: Diagnosis present

## 2023-08-02 DIAGNOSIS — Z01810 Encounter for preprocedural cardiovascular examination: Secondary | ICD-10-CM | POA: Diagnosis not present

## 2023-08-02 DIAGNOSIS — I2699 Other pulmonary embolism without acute cor pulmonale: Secondary | ICD-10-CM | POA: Diagnosis not present

## 2023-08-02 DIAGNOSIS — I4892 Unspecified atrial flutter: Secondary | ICD-10-CM | POA: Diagnosis not present

## 2023-08-02 DIAGNOSIS — I483 Typical atrial flutter: Secondary | ICD-10-CM | POA: Diagnosis not present

## 2023-08-02 DIAGNOSIS — G9340 Encephalopathy, unspecified: Secondary | ICD-10-CM | POA: Diagnosis not present

## 2023-08-02 DIAGNOSIS — F411 Generalized anxiety disorder: Secondary | ICD-10-CM | POA: Diagnosis not present

## 2023-08-02 DIAGNOSIS — J9601 Acute respiratory failure with hypoxia: Secondary | ICD-10-CM | POA: Diagnosis not present

## 2023-08-02 DIAGNOSIS — G9349 Other encephalopathy: Secondary | ICD-10-CM | POA: Diagnosis not present

## 2023-08-02 DIAGNOSIS — I10 Essential (primary) hypertension: Secondary | ICD-10-CM | POA: Diagnosis not present

## 2023-08-02 DIAGNOSIS — R7881 Bacteremia: Secondary | ICD-10-CM | POA: Diagnosis not present

## 2023-08-02 DIAGNOSIS — R4589 Other symptoms and signs involving emotional state: Secondary | ICD-10-CM | POA: Diagnosis not present

## 2023-08-02 DIAGNOSIS — D72829 Elevated white blood cell count, unspecified: Secondary | ICD-10-CM | POA: Diagnosis not present

## 2023-08-02 DIAGNOSIS — E8729 Other acidosis: Secondary | ICD-10-CM | POA: Diagnosis not present

## 2023-08-02 DIAGNOSIS — D62 Acute posthemorrhagic anemia: Secondary | ICD-10-CM | POA: Diagnosis not present

## 2023-08-02 DIAGNOSIS — I339 Acute and subacute endocarditis, unspecified: Secondary | ICD-10-CM | POA: Diagnosis not present

## 2023-08-02 LAB — BASIC METABOLIC PANEL WITH GFR
Anion gap: 11 (ref 5–15)
BUN: 11 mg/dL (ref 8–23)
CO2: 24 mmol/L (ref 22–32)
Calcium: 8.6 mg/dL — ABNORMAL LOW (ref 8.9–10.3)
Chloride: 99 mmol/L (ref 98–111)
Creatinine, Ser: 0.73 mg/dL (ref 0.44–1.00)
GFR, Estimated: >60 mL/min (ref >60)
Glucose, Bld: 211 mg/dL — ABNORMAL HIGH (ref 70–99)
Potassium: 3.8 mmol/L (ref 3.5–5.1)
Sodium: 134 mmol/L — ABNORMAL LOW (ref 135–145)

## 2023-08-02 LAB — CULTURE, OB URINE: Culture: NO GROWTH

## 2023-08-02 LAB — CBC
HCT: 36.7 % (ref 36.0–46.0)
Hemoglobin: 12 g/dL (ref 12.0–15.0)
MCH: 28 pg (ref 26.0–34.0)
MCHC: 32.7 g/dL (ref 30.0–36.0)
MCV: 85.5 fL (ref 80.0–100.0)
Platelets: 152 10*3/uL (ref 150–400)
RBC: 4.29 MIL/uL (ref 3.87–5.11)
RDW: 13.2 % (ref 11.5–15.5)
WBC: 5.9 10*3/uL (ref 4.0–10.5)
nRBC: 0 % (ref 0.0–0.2)

## 2023-08-02 LAB — GLUCOSE, CAPILLARY
Glucose-Capillary: 199 mg/dL — ABNORMAL HIGH (ref 70–99)
Glucose-Capillary: 245 mg/dL — ABNORMAL HIGH (ref 70–99)
Glucose-Capillary: 216 mg/dL — ABNORMAL HIGH (ref 70–99)
Glucose-Capillary: 230 mg/dL — ABNORMAL HIGH (ref 70–99)

## 2023-08-02 LAB — MAGNESIUM: Magnesium: 1.8 mg/dL (ref 1.7–2.4)

## 2023-08-02 NOTE — Progress Notes (Signed)
  Progress Note   Patient: Melissa Bray JWJ:[id number] DOB: 1961-01-27 DOA: 07/31/2023     0 DOS: the patient was seen and examined on 08/02/2023 at 9:04 AM      Brief hospital course: 63 y.o. F with MO, DM, HTN, HLD and BrCA s/p left mastectomy who presented with sepsis due to left upper extremity cellulitis.   Blood cultures positive for streptococcus agalactiae     Assessment and Plan: Severe sepsis due to Group B strep bacteremia due to left arm cellulitis Presented with tachycardia, fever, hypotension, sofa score 2 in the setting of left arm cellulitis.  Blood pressure improved with IV fluids, blood culture subsequently positive for group B strep.  Discussed with ID today, they recommended inpatient treatment until repeat blood cultures negative at a minimum. - Continue cefazolin  - Repeat blood cultures - Obtain echocardiogram given murmur -Hold antihypertensives  Diabetes Glucoses elevated - Continue Semglee  -Continue sliding scale correction insulin  - Hold metformin , glipizide   Hypertension Blood pressure controlled - Hold amlodipine , lisinopril , clonidine , metoprolol   Hyperlipidemia -Continue pravastatin   Anxiety -Continue duloxetine   Breast cancer - Continue anastrozole   Morbid obesity BMI of 45, class III, complicates care      Subjective: Patient is feeling better, she has no confusion, no focal weakness.  She has no chest pain or dyspnea.  Her arm swelling is persistent but improving     Physical Exam: BP 122/81 (BP Location: Right Arm)   Pulse (!) 103   Temp 98.2 F (36.8 C) (Oral)   Resp 18   Ht 5\' 5"  (1.651 m)   Wt 122.9 kg   SpO2 98%   BMI 45.09 kg/m   Adult female, sitting up in bed, interactive and appropriate RRR, loud systolic murmur noted, no peripheral edema except in the left arm which is still Some pitting in overall edema, no redness Respiratory normal, lungs clear without rales or wheezes Abdomen soft without tenderness  palpation or guarding, no ascites or distention Attention normal, affect appropriate, judgment and insight appear normal    Data Reviewed: Discussed with infectious disease Initial blood cultures positive for group B strep Basic metabolic panel shows hyponatremia Hypokalemia resolved with treatment Hypomagnesemia resolved with treatment CBC normal  Family Communication:      Disposition: Status is: Inpatient         Author: Ephriam Hashimoto, MD 08/02/2023 1:42 PM  For on call review [email or web address].

## 2023-08-02 NOTE — Plan of Care (Signed)
  Problem: Education: Goal: Knowledge of General Education information will improve Description: Including pain rating scale, medication(s)/side effects and non-pharmacologic comfort measures Outcome: Progressing   Problem: Health Behavior/Discharge Planning: Goal: Ability to manage health-related needs will improve Outcome: Progressing   Problem: Clinical Measurements: Goal: Ability to maintain clinical measurements within normal limits will improve Outcome: Progressing Goal: Will remain free from infection Outcome: Progressing Goal: Diagnostic test results will improve Outcome: Progressing Goal: Respiratory complications will improve Outcome: Progressing Goal: Cardiovascular complication will be avoided Outcome: Progressing   Problem: Activity: Goal: Risk for activity intolerance will decrease Outcome: Progressing   Problem: Nutrition: Goal: Adequate nutrition will be maintained Outcome: Progressing   Problem: Coping: Goal: Level of anxiety will decrease Outcome: Progressing   Problem: Elimination: Goal: Will not experience complications related to bowel motility Outcome: Progressing Goal: Will not experience complications related to urinary retention Outcome: Progressing   Problem: Pain Managment: Goal: General experience of comfort will improve and/or be controlled Outcome: Progressing   Problem: Safety: Goal: Ability to remain free from injury will improve Outcome: Progressing   Problem: Skin Integrity: Goal: Risk for impaired skin integrity will decrease Outcome: Progressing   Problem: Education: Goal: Ability to describe self-care measures that may prevent or decrease complications (Diabetes Survival Skills Education) will improve Outcome: Progressing Goal: Individualized Educational Video(s) Outcome: Progressing   Problem: Coping: Goal: Ability to adjust to condition or change in health will improve Outcome: Progressing   Problem: Fluid  Volume: Goal: Ability to maintain a balanced intake and output will improve Outcome: Progressing   Problem: Health Behavior/Discharge Planning: Goal: Ability to identify and utilize available resources and services will improve Outcome: Progressing Goal: Ability to manage health-related needs will improve Outcome: Progressing   Problem: Metabolic: Goal: Ability to maintain appropriate glucose levels will improve Outcome: Progressing   Problem: Nutritional: Goal: Maintenance of adequate nutrition will improve Outcome: Progressing Goal: Progress toward achieving an optimal weight will improve Outcome: Progressing   Problem: Skin Integrity: Goal: Risk for impaired skin integrity will decrease Outcome: Progressing   Problem: Tissue Perfusion: Goal: Adequacy of tissue perfusion will improve Outcome: Progressing   Problem: Fluid Volume: Goal: Hemodynamic stability will improve Outcome: Progressing   Problem: Clinical Measurements: Goal: Signs and symptoms of infection will decrease Outcome: Progressing   Problem: Clinical Measurements: Goal: Ability to avoid or minimize complications of infection will improve Outcome: Progressing   Problem: Skin Integrity: Goal: Skin integrity will improve Outcome: Progressing

## 2023-08-02 NOTE — Hospital Course (Signed)
 Patient is a 63 years old female with past medical history of diabetes mellitus type 2, hypertension, hyperlipidemia, breast cancer status post left mastectomy presented to the hospital with hypotension and fever with left arm redness swelling.    Significant events: 5/1: Admitted with hypotension, fever for arm cellulitis 5/2: BCx positive for group B streptococcus in 2/2 5/3: Echo ordered due to murmur 5/4: TTE shows MR, ID and Cardiology recommend TEE 5/5: TEE shows mitral vegetation; CT surgery, ID and Cardiology consulted  Assessment and Plan: * Mitral valve endocarditis with severe MR. Perforation of the anterior valve leaflet. Severe sepsis due to group B strep bacteremia due to left arm cellulitis TTE showed mitral valve regurgitation, suspect perforation. TEE confirmed vegetation, severe MR.  CT surgery on board and recommendation is surgical intervention but very high risk due to morbid obesity and uncontrolled diabetes.  CT surgery to decide on surgical intervention.  On penicillin G at this time.  Follow ID recommendations continue to hold amlodipine  clonidine  lisinopril  and metoprolol .  Blood cultures negative in 2 days from 08/02/2023.    History of breast cancer Continue anastrozole    GAD (generalized anxiety disorder)  Continue duloxetine    Obesity, Class III, BMI 40-49.9 (morbid obesity) Would benefit from ongoing weight loss as outpatient.   Hyperlipidemia Continue pravastatin    DM2 (diabetes mellitus, type 2), uncontrolled.  Latest hemoglobin A1c of 12.8. Continue Semglee  sliding scale insulin .  Hypomagnesemia.  Magnesium  of 1.5 today.  Will replace with IV and oral magnesium .   Essential hypertension  amlodipine , lisinopril , clonidine , metoprolol  on hold at this time.   Hypokalemia Supplemented.  No potassium levels from today.     Significant studies: 5/4: Echo -- normal EF, mitral regurgitation, likely perforation 5/5: TEE -- mobile vegetation on mitral  valve with perforation of leaflet  Significant microbiology data: 5/1 BCx x2: strep agalactiae 5/3 BCx x2: NGTD   Procedures: TTE TEE    Consults: Infectious Disease Cardiology Cardiothoracic surgery

## 2023-08-03 ENCOUNTER — Inpatient Hospital Stay (HOSPITAL_COMMUNITY)

## 2023-08-03 DIAGNOSIS — R6520 Severe sepsis without septic shock: Secondary | ICD-10-CM | POA: Diagnosis not present

## 2023-08-03 DIAGNOSIS — I059 Rheumatic mitral valve disease, unspecified: Secondary | ICD-10-CM

## 2023-08-03 DIAGNOSIS — R011 Cardiac murmur, unspecified: Secondary | ICD-10-CM

## 2023-08-03 DIAGNOSIS — A419 Sepsis, unspecified organism: Secondary | ICD-10-CM | POA: Diagnosis not present

## 2023-08-03 LAB — CULTURE, BLOOD (ROUTINE X 2)
Special Requests: ADEQUATE
Special Requests: ADEQUATE

## 2023-08-03 LAB — GLUCOSE, CAPILLARY
Glucose-Capillary: 257 mg/dL — ABNORMAL HIGH (ref 70–99)
Glucose-Capillary: 260 mg/dL — ABNORMAL HIGH (ref 70–99)
Glucose-Capillary: 197 mg/dL — ABNORMAL HIGH (ref 70–99)
Glucose-Capillary: 236 mg/dL — ABNORMAL HIGH (ref 70–99)

## 2023-08-03 LAB — ECHOCARDIOGRAM COMPLETE
Area-P 1/2: 4.89 cm2
Calc EF: 65.5 %
Height: 65 in
MV M vel: 5.2 m/s
MV Peak grad: 108.2 mmHg
MV VTI: 2.81 cm2
Radius: 1.1 cm
S' Lateral: 2.8 cm
Single Plane A2C EF: 68.3 %
Single Plane A4C EF: 60.9 %
Weight: 4335.13 [oz_av]

## 2023-08-03 MED ORDER — GLUCERNA SHAKE PO LIQD
237.0000 mL | Freq: Three times a day (TID) | ORAL | Status: DC
  Administered 2023-08-03 – 2023-08-06 (×6): 237 mL via ORAL
  Filled 2023-08-03 (×5): qty 237

## 2023-08-03 MED ORDER — INSULIN GLARGINE-YFGN 100 UNIT/ML ~~LOC~~ SOLN
20.0000 [IU] | Freq: Every day | SUBCUTANEOUS | Status: DC
  Administered 2023-08-03 – 2023-08-05 (×3): 20 [IU] via SUBCUTANEOUS
  Filled 2023-08-03 (×4): qty 0.2

## 2023-08-03 NOTE — Plan of Care (Signed)
  Problem: Education: Goal: Knowledge of General Education information will improve Description: Including pain rating scale, medication(s)/side effects and non-pharmacologic comfort measures Outcome: Progressing   Problem: Health Behavior/Discharge Planning: Goal: Ability to manage health-related needs will improve Outcome: Progressing   Problem: Clinical Measurements: Goal: Cardiovascular complication will be avoided Outcome: Progressing   Problem: Nutrition: Goal: Adequate nutrition will be maintained Outcome: Progressing   Problem: Coping: Goal: Level of anxiety will decrease Outcome: Progressing   Problem: Safety: Goal: Ability to remain free from injury will improve Outcome: Progressing   Problem: Education: Goal: Ability to describe self-care measures that may prevent or decrease complications (Diabetes Survival Skills Education) will improve Outcome: Progressing   Problem: Coping: Goal: Ability to adjust to condition or change in health will improve Outcome: Progressing   Problem: Metabolic: Goal: Ability to maintain appropriate glucose levels will improve Outcome: Progressing   Problem: Clinical Measurements: Goal: Signs and symptoms of infection will decrease Outcome: Progressing   Problem: Skin Integrity: Goal: Skin integrity will improve Outcome: Progressing

## 2023-08-03 NOTE — Plan of Care (Signed)
 No acute events overnight. Problem: Education: Goal: Knowledge of General Education information will improve Description: Including pain rating scale, medication(s)/side effects and non-pharmacologic comfort measures Outcome: Progressing   Problem: Health Behavior/Discharge Planning: Goal: Ability to manage health-related needs will improve Outcome: Progressing   Problem: Clinical Measurements: Goal: Ability to maintain clinical measurements within normal limits will improve Outcome: Progressing Goal: Will remain free from infection Outcome: Progressing Goal: Diagnostic test results will improve Outcome: Progressing Goal: Respiratory complications will improve Outcome: Progressing Goal: Cardiovascular complication will be avoided Outcome: Progressing   Problem: Activity: Goal: Risk for activity intolerance will decrease Outcome: Progressing   Problem: Nutrition: Goal: Adequate nutrition will be maintained Outcome: Progressing   Problem: Coping: Goal: Level of anxiety will decrease Outcome: Progressing   Problem: Elimination: Goal: Will not experience complications related to bowel motility Outcome: Progressing Goal: Will not experience complications related to urinary retention Outcome: Progressing   Problem: Pain Managment: Goal: General experience of comfort will improve and/or be controlled Outcome: Progressing   Problem: Safety: Goal: Ability to remain free from injury will improve Outcome: Progressing   Problem: Skin Integrity: Goal: Risk for impaired skin integrity will decrease Outcome: Progressing   Problem: Education: Goal: Ability to describe self-care measures that may prevent or decrease complications (Diabetes Survival Skills Education) will improve Outcome: Progressing Goal: Individualized Educational Video(s) Outcome: Progressing   Problem: Coping: Goal: Ability to adjust to condition or change in health will improve Outcome: Progressing    Problem: Fluid Volume: Goal: Ability to maintain a balanced intake and output will improve Outcome: Progressing   Problem: Health Behavior/Discharge Planning: Goal: Ability to identify and utilize available resources and services will improve Outcome: Progressing Goal: Ability to manage health-related needs will improve Outcome: Progressing   Problem: Metabolic: Goal: Ability to maintain appropriate glucose levels will improve Outcome: Progressing   Problem: Nutritional: Goal: Maintenance of adequate nutrition will improve Outcome: Progressing Goal: Progress toward achieving an optimal weight will improve Outcome: Progressing   Problem: Skin Integrity: Goal: Risk for impaired skin integrity will decrease Outcome: Progressing   Problem: Tissue Perfusion: Goal: Adequacy of tissue perfusion will improve Outcome: Progressing   Problem: Fluid Volume: Goal: Hemodynamic stability will improve Outcome: Progressing   Problem: Clinical Measurements: Goal: Signs and symptoms of infection will decrease Outcome: Progressing   Problem: Clinical Measurements: Goal: Ability to avoid or minimize complications of infection will improve Outcome: Progressing   Problem: Skin Integrity: Goal: Skin integrity will improve Outcome: Progressing

## 2023-08-03 NOTE — Progress Notes (Signed)
  Progress Note   Patient: Melissa Bray ZOX:[id number] DOB: 1960/08/03 DOA: 07/31/2023     1 DOS: the patient was seen and examined on 08/03/2023 at 11:24AM      Brief hospital course: 63 y.o. F with MO, DM, HTN, HLD and BrCA s/p left mastectomy who presented with sepsis due to left upper extremity cellulitis.   Blood cultures positive for streptococcus agalactiae, subsequent TTE showed mitral valve regurgitation, suspect perforation.     Assessment and Plan: Mitral valve endocarditis Severe sepsis due to group B strep bacteremia due to left arm cellulitis Presented with tachycardia, fever, hypotension.  SOFA 2.    Found subsequently to have group B strep bacteremia, TTE and follow-up showed perforated mitral valve.  Discussed with cardiology and ID. - Continue cefazolin  - Follow repeat blood cultures from 5/30 - Obtain TEE - Hold antihypertensives   Type 2 diabetes Glucose elevated - Continue Semglee , increase dose - Continue sliding scale correction insulin  - Hold metformin , glipizide   Hypertension Blood pressure controlled - Hold home amlodipine , lisinopril , clonidine , metoprolol   Hyperlipidemia -Continue pravastatin   Morbid obesity BMI 45, class III obesity, complicates care  Anxiety -Continue duloxetine   Breast cancer - Continue anastrozole          Subjective: Patient feels fine, she is a little tachycardic, but has no symptoms.  No further fever.     Physical Exam: BP 114/72   Pulse (!) 102   Temp 97.9 F (36.6 C) (Oral)   Resp 18   Ht 5\' 5"  (1.651 m)   Wt 122.9 kg   SpO2 100%   BMI 45.09 kg/m   Adult female, sitting up in bed, pleasant, interactive and appropriate RRR, no change to holosystolic murmur, no peripheral edema, except in the left arm Respiratory rate normal, lungs clear without rales or wheezes Abdomen soft no tenderness palpation or guarding, no ascites or distention Attention normal, affect pleasant, judgment and insight  appear normal, moves all extremities with normal strength and coordination    Data Reviewed: Discussed with cardiology and infectious disease Glucose elevated Echocardiogram shows mitral regurgitation Repeat blood cultures no growth to date  Family Communication:     Disposition: Status is: Inpatient         Author: Ephriam Hashimoto, MD 08/03/2023 5:25 PM  For on call review [email or web address].

## 2023-08-03 NOTE — Progress Notes (Signed)
   08/03/23 1400  Assess: MEWS Score  Temp 98.3 F (36.8 C)  BP 123/79  MAP (mmHg) 93  Pulse Rate (!) 117  Resp 20  SpO2 100 %  O2 Device Room Air  Assess: MEWS Score  MEWS Temp 0  MEWS Systolic 0  MEWS Pulse 2  MEWS RR 0  MEWS LOC 0  MEWS Score 2  MEWS Score Color Yellow  Assess: if the MEWS score is Yellow or Red  Were vital signs accurate and taken at a resting state? Yes  Does the patient meet 2 or more of the SIRS criteria? No  MEWS guidelines implemented  Yes, yellow  Treat  MEWS Interventions Considered administering scheduled or prn medications/treatments as ordered  Take Vital Signs  Increase Vital Sign Frequency  Yellow: Q2hr x1, continue Q4hrs until patient remains green for 12hrs  Escalate  MEWS: Escalate Yellow: Discuss with charge nurse and consider notifying provider and/or RRT  Notify: Charge Nurse/RN  Name of Charge Nurse/RN Notified Christa  Provider Notification  Provider Name/Title Fernande Howells  Date Provider Notified 08/03/23  Time Provider Notified 1410  Method of Notification Page  Notification Reason Change in status  Provider response No new orders  Date of Provider Response 08/03/23  Time of Provider Response 1415  Assess: SIRS CRITERIA  SIRS Temperature  0  SIRS Respirations  0  SIRS Pulse 1  SIRS WBC 0  SIRS Score Sum  1

## 2023-08-03 NOTE — Progress Notes (Signed)
  Echocardiogram 2D Echocardiogram has been performed.  Melissa Bray 08/03/2023, 11:20 AM

## 2023-08-04 ENCOUNTER — Inpatient Hospital Stay (HOSPITAL_COMMUNITY): Admitting: Anesthesiology

## 2023-08-04 ENCOUNTER — Inpatient Hospital Stay (HOSPITAL_COMMUNITY)

## 2023-08-04 ENCOUNTER — Encounter (HOSPITAL_COMMUNITY)
Admission: EM | Disposition: A | Discharge status: Rehabilitation Facility | Payer: Self-pay | Source: Other Acute Inpatient Hospital | Attending: Thoracic Surgery (Cardiothoracic Vascular Surgery)

## 2023-08-04 ENCOUNTER — Encounter (HOSPITAL_COMMUNITY): Payer: Self-pay | Admitting: Family Medicine

## 2023-08-04 DIAGNOSIS — I339 Acute and subacute endocarditis, unspecified: Secondary | ICD-10-CM

## 2023-08-04 DIAGNOSIS — R6520 Severe sepsis without septic shock: Secondary | ICD-10-CM | POA: Diagnosis not present

## 2023-08-04 DIAGNOSIS — I340 Nonrheumatic mitral (valve) insufficiency: Secondary | ICD-10-CM | POA: Diagnosis not present

## 2023-08-04 DIAGNOSIS — I059 Rheumatic mitral valve disease, unspecified: Secondary | ICD-10-CM | POA: Diagnosis not present

## 2023-08-04 DIAGNOSIS — A419 Sepsis, unspecified organism: Secondary | ICD-10-CM | POA: Diagnosis not present

## 2023-08-04 HISTORY — PX: TRANSESOPHAGEAL ECHOCARDIOGRAM (CATH LAB): EP1270

## 2023-08-04 LAB — COMPREHENSIVE METABOLIC PANEL WITH GFR
ALT: 13 U/L (ref 0–44)
AST: 17 U/L (ref 15–41)
Albumin: 2.7 g/dL — ABNORMAL LOW (ref 3.5–5.0)
Alkaline Phosphatase: 50 U/L (ref 38–126)
Anion gap: 12 (ref 5–15)
BUN: 9 mg/dL (ref 8–23)
CO2: 25 mmol/L (ref 22–32)
Calcium: 8.4 mg/dL — ABNORMAL LOW (ref 8.9–10.3)
Chloride: 93 mmol/L — ABNORMAL LOW (ref 98–111)
Creatinine, Ser: 0.94 mg/dL (ref 0.44–1.00)
GFR, Estimated: >60 mL/min (ref >60)
Glucose, Bld: 374 mg/dL — ABNORMAL HIGH (ref 70–99)
Potassium: 3.1 mmol/L — ABNORMAL LOW (ref 3.5–5.1)
Sodium: 130 mmol/L — ABNORMAL LOW (ref 135–145)
Total Bilirubin: 0.4 mg/dL (ref 0.0–1.2)
Total Protein: 6.6 g/dL (ref 6.5–8.1)

## 2023-08-04 LAB — GLUCOSE, CAPILLARY
Glucose-Capillary: 229 mg/dL — ABNORMAL HIGH (ref 70–99)
Glucose-Capillary: 131 mg/dL — ABNORMAL HIGH (ref 70–99)
Glucose-Capillary: 134 mg/dL — ABNORMAL HIGH (ref 70–99)
Glucose-Capillary: 203 mg/dL — ABNORMAL HIGH (ref 70–99)

## 2023-08-04 LAB — CBC
HCT: 36.9 % (ref 36.0–46.0)
Hemoglobin: 12.1 g/dL (ref 12.0–15.0)
MCH: 28 pg (ref 26.0–34.0)
MCHC: 32.8 g/dL (ref 30.0–36.0)
MCV: 85.4 fL (ref 80.0–100.0)
Platelets: 198 10*3/uL (ref 150–400)
RBC: 4.32 MIL/uL (ref 3.87–5.11)
RDW: 13 % (ref 11.5–15.5)
WBC: 4.8 10*3/uL (ref 4.0–10.5)
nRBC: 0 % (ref 0.0–0.2)

## 2023-08-04 LAB — ECHO TEE

## 2023-08-04 SURGERY — TRANSESOPHAGEAL ECHOCARDIOGRAM (TEE) (CATHLAB)
Anesthesia: Monitor Anesthesia Care

## 2023-08-04 MED ORDER — MAGNESIUM OXIDE -MG SUPPLEMENT 400 (240 MG) MG PO TABS
400.0000 mg | ORAL_TABLET | Freq: Once | ORAL | Status: AC
  Administered 2023-08-04: 400 mg via ORAL
  Filled 2023-08-04: qty 1

## 2023-08-04 MED ORDER — INSULIN ASPART 100 UNIT/ML IJ SOLN
0.0000 [IU] | Freq: Three times a day (TID) | INTRAMUSCULAR | Status: DC
  Administered 2023-08-04: 7 [IU] via SUBCUTANEOUS
  Administered 2023-08-04: 3 [IU] via SUBCUTANEOUS
  Administered 2023-08-05: 11 [IU] via SUBCUTANEOUS
  Administered 2023-08-05 – 2023-08-06 (×5): 7 [IU] via SUBCUTANEOUS
  Administered 2023-08-07 – 2023-08-08 (×4): 4 [IU] via SUBCUTANEOUS

## 2023-08-04 MED ORDER — POTASSIUM CHLORIDE CRYS ER 20 MEQ PO TBCR
40.0000 meq | EXTENDED_RELEASE_TABLET | Freq: Two times a day (BID) | ORAL | Status: AC
Start: 2023-08-04 — End: 2023-08-04
  Administered 2023-08-04 (×2): 40 meq via ORAL
  Filled 2023-08-04 (×2): qty 2

## 2023-08-04 MED ORDER — SODIUM CHLORIDE 0.9 % IV SOLN: INTRAVENOUS | Status: DC | PRN

## 2023-08-04 MED ORDER — PENICILLIN G POTASSIUM 20000000 UNITS IJ SOLR
12.0000 10*6.[IU] | Freq: Two times a day (BID) | INTRAVENOUS | Status: DC
  Administered 2023-08-04 – 2023-08-09 (×10): 12 10*6.[IU] via INTRAVENOUS
  Filled 2023-08-04 (×11): qty 12

## 2023-08-04 MED ORDER — SODIUM CHLORIDE 0.9 % IV SOLN: INTRAVENOUS | Status: DC

## 2023-08-04 MED ORDER — PROPOFOL 10 MG/ML IV BOLUS
INTRAVENOUS | Status: DC | PRN
  Administered 2023-08-04: 30 mg via INTRAVENOUS
  Administered 2023-08-04: 20 mg via INTRAVENOUS

## 2023-08-04 MED ORDER — LIDOCAINE 2% (20 MG/ML) 5 ML SYRINGE
INTRAMUSCULAR | Status: DC | PRN
Start: 2023-08-04 — End: 2023-08-04
  Administered 2023-08-04: 100 mg via INTRAVENOUS

## 2023-08-04 MED ORDER — PROPOFOL 500 MG/50ML IV EMUL
INTRAVENOUS | Status: DC | PRN
  Administered 2023-08-04: 125 ug/kg/min via INTRAVENOUS

## 2023-08-04 NOTE — Assessment & Plan Note (Signed)
 Severe sepsis due to group B strep bacteremia due to left arm cellulitis Presented with tachycardia, fever, hypertension, sofa score 2  Blood cultures positive for streptococcus agalactiae, subsequent TTE showed mitral valve regurgitation, suspect perforation.  TEE confirmed vegetation, severe MR.  CT surgery consultation recommended, transferred to [facility]. -Continue cefazolin  - Consult infectious disease

## 2023-08-04 NOTE — Assessment & Plan Note (Signed)
 Complicates care

## 2023-08-04 NOTE — Anesthesia Preprocedure Evaluation (Addendum)
 Anesthesia Evaluation  Patient identified by MRN, date of birth, ID band Patient awake    Reviewed: Allergy & Precautions, NPO status , Patient's Chart, lab work & pertinent test results, reviewed documented beta blocker date and time   History of Anesthesia Complications Negative for: history of anesthetic complications  Airway Mallampati: I  TM Distance: >3 FB Neck ROM: Full    Dental  (+) Dental Advisory Given   Pulmonary neg pulmonary ROS   breath sounds clear to auscultation       Cardiovascular hypertension, Pt. on medications and Pt. on home beta blockers (-) angina + Valvular Problems/Murmurs MR  Rhythm:Regular Rate:Normal + Systolic murmurs 04/06/1094 ECHO: EF 65 to 70%.  1. The LV has normal function, no regional wall motion abnormalities. Left ventricular diastolic parameters are indeterminate.  2. RVF is normal. The right ventricular size is normal. Tricuspid regurgitation signal is inadequate for assessing PA pressure.  3. Moderate to probably severe mitral regurgitation. Regurgitant jet originates from the proximal portion of the anterior mitral valve leaflet as opposed to the distal coapation point suggesting likely perforated anterior mitral valve leaflet. Recommend  TEE to better evaluate. . The mitral valve is abnormal. Moderate to severe mitral valve regurgitation. No evidence of mitral stenosis.  4. The aortic valve is tricuspid. Aortic valve regurgitation is not visualized. No aortic stenosis is present.    Neuro/Psych   Anxiety Depression    negative neurological ROS     GI/Hepatic Neg liver ROS,GERD  Controlled,,  Endo/Other  diabetes (glu 229), Oral Hypoglycemic Agents, Insulin  Dependent  Class 4 obesityBMI 45  Renal/GU Renal InsufficiencyRenal disease     Musculoskeletal   Abdominal   Peds  Hematology Hb 12.1, plt 198k   Anesthesia Other Findings H/o breast cancer Sepsis, R arm cellulitis   Reproductive/Obstetrics                             Anesthesia Physical Anesthesia Plan  ASA: 4  Anesthesia Plan: MAC   Post-op Pain Management: Minimal or no pain anticipated   Induction:   PONV Risk Score and Plan: 2 and Treatment may vary due to age or medical condition  Airway Management Planned: Natural Airway and Nasal Cannula  Additional Equipment: None  Intra-op Plan:   Post-operative Plan:   Informed Consent: I have reviewed the patients History and Physical, chart, labs and discussed the procedure including the risks, benefits and alternatives for the proposed anesthesia with the patient or authorized representative who has indicated his/her understanding and acceptance.     Dental advisory given  Plan Discussed with: CRNA and Surgeon  Anesthesia Plan Comments:        Anesthesia Quick Evaluation

## 2023-08-04 NOTE — Plan of Care (Signed)
  Problem: Education: Goal: Knowledge of General Education information will improve Description: Including pain rating scale, medication(s)/side effects and non-pharmacologic comfort measures Outcome: Progressing   Problem: Health Behavior/Discharge Planning: Goal: Ability to manage health-related needs will improve Outcome: Progressing   Problem: Clinical Measurements: Goal: Ability to maintain clinical measurements within normal limits will improve Outcome: Progressing Goal: Will remain free from infection Outcome: Progressing Goal: Diagnostic test results will improve Outcome: Progressing Goal: Respiratory complications will improve Outcome: Progressing   Problem: Activity: Goal: Risk for activity intolerance will decrease Outcome: Progressing   Problem: Nutrition: Goal: Adequate nutrition will be maintained Outcome: Progressing   Problem: Pain Managment: Goal: General experience of comfort will improve and/or be controlled Outcome: Progressing

## 2023-08-04 NOTE — Assessment & Plan Note (Signed)
 Sugars improve after adjusting insulin  5/4 - Continue Semglee  nightly - Continue sliding scale correction insulin  - Continue pravastatin 

## 2023-08-04 NOTE — Consult Note (Signed)
 [facility]    Date of Admission:  07/31/2023   Total days of inpatient antibiotics 3        Reason for Consult: Group B strep endocarditis    Principal Problem:   Mitral valve endocarditis Active Problems:   Essential hypertension   DM2 (diabetes mellitus, type 2) (HCC)   HLD (hyperlipidemia)   Severe sepsis (HCC)   Cellulitis of left upper extremity   Obesity, Class III, BMI 40-49.9 (morbid obesity)   Perforation of leaflet of mitral valve   GAD (generalized anxiety disorder)   History of breast cancer   Assessment: 63 year old female with history of diabetes type 2, hypertension, breast cancer status post mastectomy in 2016 presented with left upper extremity swelling, fevers and chills found to have: #Group B strep bacteremia with native mitral valve endocarditis/perforation - Patient had been feeling unwell for about 5 to 6 days. - She had fever and leukocytosis admission - TTE showed severe MR, possible perforated anterior mitral leaflet. - TEE showed thickening with mobile vegetation on mitral valve, perforated anterior mitral leaflet and severe MR. - Attempted to see pt yesterday->not in room. Seen today -CTS consulted-> urgent dental consult. Of note pt had left maxillary periapical abscess(reports she does not recall seeing dentistry int he past).  #Diabetes mellitus, uncontrolled  -A1c 12.8 on 07/31/2023   Recommendations:  -DC cefazolin  - Start penicillin I will - Follow repeat blood cultures to ensure clearance - Agree with dentistry consult. Follow cts recommendations(recc mitral surgery). Will reset the clock on 6 weeks of abx form OR.  Evaluation of this patient requires complex antimicrobial therapy evaluation and counseling + isolation needs for disease transmission risk assessment and mitigation   Microbiology:   Antibiotics: Cefazolin  5/2-present Ceftriaxone  5/4 Vancomycin  metronidazole   5/12  Cultures: Blood 5/12/2 group B strep Urine  Other   HPI: Melissa Bray is a 63 y.o. female with past medical history significant diabetes mellitus type 2, essential hypertension hyperlipidemia, breast cancer status post left mastectomy in 2016 admitted to [facility] 5/1 as a transfer from med [facility] for cystitis severe sepsis left upper extremity cellulitis.  Patient has had generalized weakness for about 5 to 6 days.  Also has subjective fever noted increased swelling in left upper extremity to left elbow.  She had a temp of 103.2 started on broad-spectrum antibiotics.  Blood cultures grew 2/2 set of group B strep.  TTE showed severe MR, likely perforated anterior mitral leaflet.  TEE showed thickening with mobile vegetation on mitral valve, perforated anterior mitral leaflet and severe MR.   Review of Systems: Review of Systems  All other systems reviewed and are negative.   Past Medical History:  Diagnosis Date   Anxiety    Breast cancer (HCC)    Diabetes mellitus without complication (HCC)    Hyperlipidemia    Hypertension    Type 2 diabetes mellitus without retinopathy (HCC) 06/17/2017    Social History   Tobacco Use   Smoking status: Never    Passive exposure: Never   Smokeless tobacco: Never  Vaping Use   Vaping status: Never Used  Substance Use Topics   Alcohol use: Never   Drug use: Never    Family History  Problem Relation Age of Onset   Asthma Mother    Cancer Father        prostate cancer   Diabetes Father    Hypertension Father    Cancer  Brother        prostate cancer   Asthma Brother    Colon cancer Neg Hx    Colon polyps Neg Hx    Crohn's disease Neg Hx    Esophageal cancer Neg Hx    Rectal cancer Neg Hx    Stomach cancer Neg Hx    Ulcerative colitis Neg Hx    Scheduled Meds:  anastrozole   1 mg Oral Daily   DULoxetine   30 mg Oral Daily   feeding supplement (GLUCERNA SHAKE)  237 mL Oral TID BM   insulin  aspart  0-20 Units  Subcutaneous TID WC   insulin  aspart  0-5 Units Subcutaneous QHS   insulin  glargine-yfgn  20 Units Subcutaneous Q2200   potassium chloride   40 mEq Oral BID   pravastatin   20 mg Oral Daily   Continuous Infusions:   ceFAZolin  (ANCEF ) IV 2 g (08/04/23 0412)   lactated ringers      And   lactated ringers      PRN Meds:.acetaminophen  **OR** acetaminophen , melatonin, ondansetron  (ZOFRAN ) IV No Known Allergies  OBJECTIVE: Blood pressure 131/86, pulse (!) 107, temperature 97.9 F (36.6 C), temperature source Temporal, resp. rate 18, height 5\' 5"  (1.651 m), weight 122.9 kg, SpO2 95%.  Physical Exam Constitutional:      Appearance: Normal appearance.  HENT:     Head: Normocephalic and atraumatic.     Right Ear: Tympanic membrane normal.     Left Ear: Tympanic membrane normal.     Nose: Nose normal.     Mouth/Throat:     Mouth: Mucous membranes are moist.  Eyes:     Extraocular Movements: Extraocular movements intact.     Conjunctiva/sclera: Conjunctivae normal.     Pupils: Pupils are equal, round, and reactive to light.  Cardiovascular:     Rate and Rhythm: Normal rate and regular rhythm.     Heart sounds: No murmur heard.    No friction rub. No gallop.  Pulmonary:     Effort: Pulmonary effort is normal.     Breath sounds: Normal breath sounds.  Abdominal:     General: Abdomen is flat.     Palpations: Abdomen is soft.  Musculoskeletal:        General: Normal range of motion.  Skin:    General: Skin is warm and dry.  Neurological:     General: No focal deficit present.     Mental Status: She is alert and oriented to person, place, and time.  Psychiatric:        Mood and Affect: Mood normal.     Lab Results Lab Results  Component Value Date   WBC 4.8 08/04/2023   HGB 12.1 08/04/2023   HCT 36.9 08/04/2023   MCV 85.4 08/04/2023   PLT 198 08/04/2023    Lab Results  Component Value Date   CREATININE 0.94 08/04/2023   BUN 9 08/04/2023   NA 130 (L) 08/04/2023   K 3.1  (L) 08/04/2023   CL 93 (L) 08/04/2023   CO2 25 08/04/2023    Lab Results  Component Value Date   ALT 13 08/04/2023   AST 17 08/04/2023   ALKPHOS 50 08/04/2023   BILITOT 0.4 08/04/2023       Orlie Bjornstad, MD [facility] 08/04/2023, 3:40 PM

## 2023-08-04 NOTE — CV Procedure (Signed)
 TRANSESOPHAGEAL ECHOCARDIOGRAM (TEE) NOTE  INDICATIONS: infective endocarditis  PROCEDURE:   Informed consent was obtained prior to the procedure. The risks, benefits and alternatives for the procedure were discussed and the patient comprehended these risks.  Risks include, but are not limited to, cough, sore throat, vomiting, nausea, somnolence, esophageal and stomach trauma or perforation, bleeding, low blood pressure, aspiration, pneumonia, infection, trauma to the teeth and death.    After a procedural time-out, the patient was given propofol for sedation by anesthesia. See their separate report.  The patient's heart rate, blood pressure, and oxygen saturation are monitored continuously during the procedure.The oropharynx was anesthetized with topical cetacaine.  The transesophageal probe was inserted in the esophagus and stomach without difficulty and multiple views were obtained.  The patient was kept under observation until the patient left the procedure room.  I was present face-to-face 100% of this time. The patient left the procedure room in stable condition.   Agitated microbubble saline contrast was administered.  COMPLICATIONS:    There were no immediate complications.  Findings:  LEFT VENTRICLE: The left ventricular wall thickness is normal.  The left ventricular cavity is normal in size. Wall motion is normal.  LVEF is 65-70%.  RIGHT VENTRICLE:  The right ventricle is normal in structure and function without any thrombus or masses.    LEFT ATRIUM:  The left atrium is normal in size without any thrombus or masses.  There is not spontaneous echo contrast ("smoke") in the left atrium consistent with a low flow state.  LEFT ATRIAL APPENDAGE:  The left atrial appendage is free of any thrombus or masses. The appendage has single lobes. Pulse doppler indicates high flow in the appendage.  ATRIAL SEPTUM:  The atrial septum is thin and aneurysmal with predominantly right  bowing, suggestive of high LA pressure.  There is no evidence for interatrial shunting by color doppler and saline microbubble contrast.  RIGHT ATRIUM:  The right atrium is normal in size and function without any thrombus or masses.  MITRAL VALVE:  The mitral valve is normal abnormal with thickening and mobile vegetation noted on both leaflets consistent with acute valvular endocarditis. There is apparent perforation of the anterior mitral leaflet with Severe eccentric regurgitation which was well-visualized with 2D/3D modes.  There is no significant stenosis.  AORTIC VALVE:  The aortic valve is trileaflet, normal in structure and function with  no  regurgitation.  There were no vegetations or stenosis  TRICUSPID VALVE:  The tricuspid valve is normal in structure and function with Mild regurgitation.  There were no vegetations or stenosis   PULMONIC VALVE:  The pulmonic valve is normal in structure and function with  trivial  regurgitation.  There were no vegetations or stenosis.   AORTIC ARCH, ASCENDING AND DESCENDING AORTA:  There was no Ardell Koller et. Al, 1992) atherosclerosis of the ascending aorta, aortic arch, or proximal descending aorta.  12. PULMONARY VEINS: Anomalous pulmonary venous return was not noted. Reversal of flow in the LUPV was noted as expected given severe MR.  13. PERICARDIUM: The pericardium appeared normal and non-thickened.  There is no pericardial effusion.  IMPRESSION:   Acute mitral valve endocarditis with perforated anterior mitral leaflet and severe, eccentric MR. No LAA thrombus Aneurysmal IAS, right-bowing, however, no shunting by color doppler or saline microbubble contrast Normal biatrial size LVEF 65-70%  RECOMMENDATIONS:     CT surgical evaluation is recommended due to leaflet perforation.  Time Spent Directly with the Patient:  45 minutes  Hazle Lites, MD, [facility], FNLA, FACP  [place]  [facility]  Medical Director of the Advanced Lipid  Disorders &  Cardiovascular Risk Reduction Clinic Diplomate of the American Board of Clinical Lipidology Attending Cardiologist  Direct Dial: [phone]  Fax: [phone]  Website:  [email or web address] Loura Pitt 08/04/2023, 11:26 AM

## 2023-08-04 NOTE — Assessment & Plan Note (Signed)
 Continue anastrozole

## 2023-08-04 NOTE — Assessment & Plan Note (Signed)
 Blood pressure low normal - Hold amlodipine , lisinopril , clonidine , metoprolol 

## 2023-08-04 NOTE — Interval H&P Note (Signed)
 History and Physical Interval Note:  08/04/2023 9:56 AM  Melissa Bray  has presented today for surgery, with the diagnosis of Severe MR.  The various methods of treatment have been discussed with the patient and family. After consideration of risks, benefits and other options for treatment, the patient has consented to  Procedure(s): TRANSESOPHAGEAL ECHOCARDIOGRAM (N/A) as a surgical intervention.  The patient's history has been reviewed, patient examined, no change in status, stable for surgery.  I have reviewed the patient's chart and labs.  Questions were answered to the patient's satisfaction.     Hazle Lites

## 2023-08-04 NOTE — Consult Note (Cosign Needed)
 [address]       [place] [address]             [phone]        Sala Draney [facility] Medical Record #[id number] Date of Birth: December 30, 1960  Referring: No ref. provider found Primary Care: Nche, Connye Delaine, NP Primary Cardiologist:James Hochrein, MD  Chief Complaint:    Chief Complaint  Patient presents with   Fatigue    History of Present Illness:     Ms. Rijos is a 63 year old female with a past medical history of hypertension, class III obesity, hyperlipidemia, uncontrolled type 2 diabetes mellitus, generalized anxiety disorder, and breast cancer (s/p left mastectomy 2016). She presented to [facility] ED with complaints of weakness, worsening fatigue, chills, fever and lack of appetite for 5 days. She denied chest pain, shortness of breath, nausea and vomiting. She also noticed increased swelling, erythema, warmth and tenderness of her left upper extremity above her elbow where she chronically has lymphedema. Upon arrival to the ED she was febrile, tachycardic and BP was initially 95/65 prior to IV fluid administration. Blood cultures x 2 were drawn and she was started on IV Vancomycin  and IV Flagyl  for sepsis. She was then transferred to [facility] on 05/01 for admission for sepsis due to suspected LUE cellulitis at that time. LUE ultrasound was negative for acute DVT. Blood cultures were positive for streptococcus agalactiae. Vancomycin  and Flagyl  were transitioned to IV Cefazolin . The patient was found to have a murmur on exam. She was transferred to [facility] and underwent TTE on 05/04 which showed LVEF 65-70%, moderate to probable severe mitral regurgitation with a jet originating from the proximal portion of the anterior mitral valve leaflet suggesting a likely perforated anterior mitral valve leaflet. TEE on 08/04/23 showed LVEF 65-70%, a mobile vegetation on the anterior mitral leaflet with perforation as well as  thickening and probable vegetation of the posterior leaflet, and severe eccentric mitral valve regurgitation.   The patient states her arm swelling and redness has resolved since she has been in the hospital. She denies current chest pain and is hoping to get surgery. She works as a Lawyer.  Current Activity/ Functional Status: Patient is independent with mobility/ambulation, transfers, ADL's, IADL's.   Zubrod Score: At the time of surgery this patient's most appropriate activity status/level should be described as: []     0    Normal activity, no symptoms [x]     1    Restricted in physical strenuous activity but ambulatory, able to do out light work []     2    Ambulatory and capable of self care, unable to do work activities, up and about                 more than 50%  Of the time                            []     3    Only limited self care, in bed greater than 50% of waking hours []     4    Completely disabled, no self care, confined to bed or chair []     5    Moribund  Past Medical History:  Diagnosis Date   Anxiety    Breast cancer (HCC)    Diabetes mellitus without complication (HCC)    Hyperlipidemia    Hypertension  Type 2 diabetes mellitus without retinopathy (HCC) 06/17/2017    Past Surgical History:  Procedure Laterality Date   ABDOMINAL HYSTERECTOMY     BREAST BIOPSY     MASTECTOMY Left 2016    Social History   Tobacco Use  Smoking Status Never   Passive exposure: Never  Smokeless Tobacco Never    Social History   Substance and Sexual Activity  Alcohol Use Never     No Known Allergies  Current Facility-Administered Medications  Medication Dose Route Frequency Provider Last Rate Last Admin   [MAR Hold] acetaminophen  (TYLENOL ) tablet 650 mg  650 mg Oral Q6H PRN Howerter, Justin B, DO       Or   [MAR Hold] acetaminophen  (TYLENOL ) suppository 650 mg  650 mg Rectal Q6H PRN Howerter, Justin B, DO       [MAR Hold] anastrozole  (ARIMIDEX ) tablet 1 mg  1 mg Oral  Daily Howerter, Justin B, DO   1 mg at 08/03/23 0901   [MAR Hold] ceFAZolin  (ANCEF ) IVPB 2g/100 mL premix  2 g Intravenous Q8H Evette Hoes L, MD 200 mL/hr at 08/04/23 0412 2 g at 08/04/23 0412   [MAR Hold] DULoxetine  (CYMBALTA ) DR capsule 30 mg  30 mg Oral Daily Howerter, Justin B, DO   30 mg at 08/03/23 0901   [MAR Hold] feeding supplement (GLUCERNA SHAKE) (GLUCERNA SHAKE) liquid 237 mL  237 mL Oral TID BM Danford, Willis Harter, MD   237 mL at 08/03/23 1651   [MAR Hold] insulin  aspart (novoLOG ) injection 0-20 Units  0-20 Units Subcutaneous TID WC Ephriam Hashimoto, MD   7 Units at 08/04/23 0752   [facility] Hold] insulin  aspart (novoLOG ) injection 0-5 Units  0-5 Units Subcutaneous QHS Howerter, Justin B, DO   2 Units at 08/03/23 2129   [facility] Hold] insulin  glargine-yfgn (SEMGLEE ) injection 20 Units  20 Units Subcutaneous Q2200 Ephriam Hashimoto, MD   20 Units at 08/03/23 2129   [facility] Hold] lactated ringers  bolus 1,000 mL  1,000 mL Intravenous Once Soto, Johana, PA-C       And   [facility] Hold] lactated ringers  bolus 1,000 mL  1,000 mL Intravenous Once Soto, Johana, PA-C       [MAR Hold] melatonin tablet 3 mg  3 mg Oral QHS PRN Howerter, Justin B, DO   3 mg at 08/02/23 2106   [facility] Hold] ondansetron  (ZOFRAN ) injection 4 mg  4 mg Intravenous Q6H PRN Howerter, Justin B, DO       [MAR Hold] potassium chloride  SA (KLOR-CON  M) CR tablet 40 mEq  40 mEq Oral BID Ephriam Hashimoto, MD   40 mEq at 08/04/23 0753   [MAR Hold] pravastatin  (PRAVACHOL ) tablet 20 mg  20 mg Oral Daily Howerter, Justin B, DO   20 mg at 08/03/23 0901    Medications Prior to Admission  Medication Sig Dispense Refill Last Dose/Taking   alendronate  (FOSAMAX ) 70 MG tablet Take 1 tablet (70 mg total) by mouth once a week. Take with a full glass of water on an empty stomach. 12 tablet 3 07/28/2023   amLODipine  (NORVASC ) 10 MG tablet Take 1 tablet (10 mg total) by mouth at bedtime. (Patient taking differently: Take 20 mg by mouth at  bedtime.) 90 tablet 1 07/27/2023   anastrozole  (ARIMIDEX ) 1 MG tablet Take 1 mg by mouth daily.   07/31/2023   cloNIDine  (CATAPRES ) 0.1 MG tablet Take 1 tablet (0.1 mg total) by mouth 2 (two) times daily. 180 tablet 3 07/31/2023   glipiZIDE  (  GLUCOTROL  XL) 10 MG 24 hr tablet Take 1 tablet (10 mg total) by mouth daily with breakfast. 90 tablet 3 07/31/2023   insulin  glargine (LANTUS ) 100 UNIT/ML Solostar Pen Inject 20 Units into the skin daily. 3 mL 0 07/29/2023   lisinopril  (ZESTRIL ) 40 MG tablet Take 1 tablet (40 mg total) by mouth daily. 90 tablet 3 07/31/2023   metFORMIN  (GLUCOPHAGE ) 850 MG tablet Take 1 tablet (850 mg total) by mouth 2 (two) times daily with a meal. 60 tablet 0 07/31/2023   metoprolol  succinate (TOPROL -XL) 100 MG 24 hr tablet Take 1 tablet (100 mg total) by mouth daily. 90 tablet 3 07/30/2023   pravastatin  (PRAVACHOL ) 20 MG tablet Take 1 tablet (20 mg total) by mouth daily. 90 tablet 3 07/31/2023   Vitamin D , Ergocalciferol , (DRISDOL ) 1.25 MG (50000 UNIT) CAPS capsule TAKE 1 CAPSULE ONCE A WEEK FOR 12 WEEKS. AFTER 12 WEEKS SWITCH TO OVER THE COUNTER VITAMIN D  2000 IU 12 capsule 0 07/28/2023   Continuous Glucose Sensor (FREESTYLE LIBRE 3 PLUS SENSOR) MISC Inject 1 Units into the skin every 14 (fourteen) days. Change sensor every 15 days. 2 each 11    Insulin  Pen Needle (PEN NEEDLES) 31G X 6 MM MISC 1 application  by Does not apply route at bedtime. 90 each 3     Family History  Problem Relation Age of Onset   Asthma Mother    Cancer Father        prostate cancer   Diabetes Father    Hypertension Father    Cancer Brother        prostate cancer   Asthma Brother    Colon cancer Neg Hx    Colon polyps Neg Hx    Crohn's disease Neg Hx    Esophageal cancer Neg Hx    Rectal cancer Neg Hx    Stomach cancer Neg Hx    Ulcerative colitis Neg Hx    Review of Systems:   Review of Systems  Constitutional:  Positive for chills, fever and malaise/fatigue. Negative for diaphoresis and weight  loss.  HENT:  Negative for hearing loss.   Eyes:  Negative for blurred vision.  Respiratory:  Negative for cough, sputum production, shortness of breath and wheezing.   Cardiovascular:  Negative for chest pain, palpitations, orthopnea and leg swelling.  Gastrointestinal:  Negative for abdominal pain, heartburn, nausea and vomiting.  Musculoskeletal:  Negative for falls and myalgias.  Skin:  Negative for rash.       Erythema and swelling of the LUE above the elbow  Neurological:  Positive for weakness. Negative for dizziness, focal weakness and loss of consciousness.  Endo/Heme/Allergies:  Does not bruise/bleed easily.  Psychiatric/Behavioral:  Negative for depression. The patient is nervous/anxious.   Patient does not remember the last time she saw the dentist   Physical Exam: BP 114/79   Pulse (!) 113   Temp 97.9 F (36.6 C) (Temporal)   Resp 14   Ht 5\' 5"  (1.651 m)   Wt 122.9 kg   SpO2 97%   BMI 45.09 kg/m   General appearance: alert, cooperative, and no distress Head: Normocephalic, without obvious abnormality, atraumatic Neck: no adenopathy, no carotid bruit, no JVD, supple, symmetrical, trachea midline, and thyroid  not enlarged, symmetric, no tenderness/mass/nodules Lymph nodes: Cervical, supraclavicular, and axillary nodes normal. Resp: clear to auscultation bilaterally Cardio: regular rate and rhythm, 4/6 blowing systolic murmur GI: soft, non-tender; bowel sounds normal; no masses,  no organomegaly Extremities: extremities normal, atraumatic, no cyanosis or  edema Neurologic: Grossly normal Dental: Partially edentulous and dental caries present   Diagnostic Studies & Radiology Findings:  ECHOCARDIOGRAM REPORT       Patient Name:   LIDUVINA CHILDREY Date of Exam: 08/03/2023  Medical Rec #:  [id number]     Height:       65.0 in  Accession #:    [id number]    Weight:       270.9 lb  Date of Birth:  1960/12/20     BSA:          2.251 m  Patient Age:    62 years      BP:            115/68 mmHg  Patient Gender: F             HR:           106 bpm.  Exam Location:  Inpatient   Procedure: 2D Echo, Cardiac Doppler and Color Doppler (Both Spectral and  Color            Flow Doppler were utilized during procedure).   Indications:    Bacteremia; R01.1 Murmur    History:        Patient has no prior history of Echocardiogram  examinations.                 Signs/Symptoms:Bacteremia; Risk Factors:Hypertension,                  Dyslipidemia and Diabetes. Breast cancer.    Sonographer:    Raynelle Callow RDCS  Referring Phys: [id number] CHRISTOPHER P DANFORD     Sonographer Comments: Patient is obese. Image acquisition challenging due  to mastectomy.  IMPRESSIONS     1. Left ventricular ejection fraction, by estimation, is 65 to 70%. The  left ventricle has normal function. The left ventricle has no regional  wall motion abnormalities. Left ventricular diastolic parameters are  indeterminate.   2. Right ventricular systolic function is normal. The right ventricular  size is normal. Tricuspid regurgitation signal is inadequate for assessing  PA pressure.   3. Moderate to probably severe mitral regurgitation. Regurgitant jet  originates from the proximal portion of the anterior mitral valve leaflet  as opposed to the distal coapation point suggesting likely perforated  anterior mitral valve leaflet. Recommend   TEE to better evaluate. . The mitral valve is abnormal. Moderate to  severe mitral valve regurgitation. No evidence of mitral stenosis.   4. The aortic valve is tricuspid. Aortic valve regurgitation is not  visualized. No aortic stenosis is present.   FINDINGS   Left Ventricle: Left ventricular ejection fraction, by estimation, is 65  to 70%. The left ventricle has normal function. The left ventricle has no  regional wall motion abnormalities. The left ventricular internal cavity  size was normal in size. There is   no left ventricular hypertrophy. Left  ventricular diastolic parameters  are indeterminate.   Right Ventricle: The right ventricular size is normal. Right vetricular  wall thickness was not well visualized. Right ventricular systolic  function is normal. Tricuspid regurgitation signal is inadequate for  assessing PA pressure.   Left Atrium: Left atrial size was normal in size.   Right Atrium: Right atrial size was normal in size.   Pericardium: There is no evidence of pericardial effusion.   Mitral Valve: Moderate to probably severe mitral regurgitation.  Regurgitant jet originates from the proximal portion of the anterior  mitral valve leaflet as opposed  to the distal coapation point suggesting  likely perforated anterior mitral valve leaflet.   Recommend TEE to better evaluate. The mitral valve is abnormal. There is  mild thickening of the mitral valve leaflet(s). There is mild  calcification of the mitral valve leaflet(s). Mild mitral annular  calcification. Moderate to severe mitral valve  regurgitation. No evidence of mitral valve stenosis. MV peak gradient,  12.7 mmHg. The mean mitral valve gradient is 5.0 mmHg.   Tricuspid Valve: The tricuspid valve is normal in structure. Tricuspid  valve regurgitation is trivial. No evidence of tricuspid stenosis.   Aortic Valve: The aortic valve is tricuspid. Aortic valve regurgitation is  not visualized. No aortic stenosis is present.   Pulmonic Valve: The pulmonic valve was not well visualized. Pulmonic valve  regurgitation is not visualized. No evidence of pulmonic stenosis.   Aorta: The aortic root and ascending aorta are structurally normal, with  no evidence of dilitation.   Venous: The inferior vena cava was not well visualized.   IAS/Shunts: No atrial level shunt detected by color flow Doppler.     LEFT VENTRICLE  PLAX 2D  LVIDd:         4.10 cm     Diastology  LVIDs:         2.80 cm     LV e' medial:    4.24 cm/s  LV PW:         0.90 cm     LV E/e' medial:   27.6  LV IVS:        1.00 cm     LV e' lateral:   5.44 cm/s  LVOT diam:     2.30 cm     LV E/e' lateral: 21.5  LV SV:         57  LV SV Index:   25  LVOT Area:     4.15 cm    LV Volumes (MOD)  LV vol d, MOD A2C: 94.3 ml  LV vol d, MOD A4C: 99.8 ml  LV vol s, MOD A2C: 29.9 ml  LV vol s, MOD A4C: 39.0 ml  LV SV MOD A2C:     64.4 ml  LV SV MOD A4C:     99.8 ml  LV SV MOD BP:      64.5 ml   RIGHT VENTRICLE             IVC  RV S prime:     12.30 cm/s  IVC diam: 1.60 cm  TAPSE (M-mode): 1.9 cm   LEFT ATRIUM             Index        RIGHT ATRIUM           Index  LA diam:        4.00 cm 1.78 cm/m   RA Area:     10.70 cm  LA Vol (A2C):   52.3 ml 23.24 ml/m  RA Volume:   20.70 ml  9.20 ml/m  LA Vol (A4C):   52.1 ml 23.15 ml/m  LA Biplane Vol: 51.8 ml 23.02 ml/m   AORTIC VALVE             PULMONIC VALVE  LVOT Vmax:   95.00 cm/s  PR End Diast Vel: 1.42 msec  LVOT Vmean:  60.800 cm/s  LVOT VTI:    0.136 m    AORTA  Ao Root diam: 2.70 cm  Ao Asc diam:  3.20 cm   MITRAL VALVE  MV Area (PHT): 4.89 cm       SHUNTS  MV Area VTI:   2.81 cm       Systemic VTI:  0.14 m  MV Peak grad:  12.7 mmHg      Systemic Diam: 2.30 cm  MV Mean grad:  5.0 mmHg  MV Vmax:       1.78 m/s  MV Vmean:      97.1 cm/s  MV Decel Time: 155 msec  MR Peak grad:    108.2 mmHg  MR Mean grad:    67.0 mmHg  MR Vmax:         520.00 cm/s  MR Vmean:        387.0 cm/s  MR PISA:         7.60 cm  MR PISA Eff ROA: 56 mm  MR PISA Radius:  1.10 cm  MV E velocity: 117.00 cm/s  MV A velocity: 147.00 cm/s  MV E/A ratio:  0.80   Armida Lander MD  Electronically signed by Armida Lander MD  Signature Date/Time: 08/03/2023/1:16:09 PM        Final     TRANSESOPHOGEAL ECHO REPORT       Patient Name:   [name] Date of Exam: 08/04/2023  Medical Rec #:  [id number]     Height:       65.0 in  Accession #:    [id number]    Weight:       270.9 lb  Date of Birth:  May 03, 1960     BSA:          2.251 m   Patient Age:    62 years      BP:           120/80 mmHg  Patient Gender: F             HR:           121 bpm.  Exam Location:  Inpatient   Procedure: Transesophageal Echo, 3D Echo, Cardiac Doppler and Color  Doppler            (Both Spectral and Color Flow Doppler were utilized during             procedure).   Indications:     Mitral Valve Abnormality    History:         Patient has prior history of Echocardiogram examinations,  most                  recent 08/03/2023. Mitral Valve Disease,  Signs/Symptoms:Murmur;                  Risk Factors:Diabetes and Hypertension.    Sonographer:     Astrid Blamer  Referring Phys:  [id number] Aviva Lemmings HILTY  Diagnosing Phys: Dinah Franco MD   PROCEDURE: After discussion of the risks and benefits of a TEE, an  informed consent was obtained from the patient. The transesophogeal probe  was passed without difficulty through the esophogus of the patient.  Sedation performed by different physician.  The patient was monitored while under deep sedation. Anesthestetic  sedation was provided intravenously by Anesthesiology: 220mg  of Propofol,  100mg  of Lidocaine. The patient developed no complications during the  procedure.    IMPRESSIONS     1. Left ventricular ejection fraction, by estimation, is 65 to 70%. The  left ventricle has hyperdynamic function.   2. Right ventricular systolic function is normal. The right ventricular  size is  normal.   3. No left atrial/left atrial appendage thrombus was detected. The LAA  emptying velocity was 65 cm/s.   4. Mobile vegetation noted on the anterior mitral leaflet with  perforation of the leaflet. There is thickening and probably vegetation of  the posterior leaflet as well. The mitral valve is abnormal. Severe mitral  valve regurgitation. No evidence of  mitral stenosis.   5. The aortic valve is tricuspid. Aortic valve regurgitation is not  visualized.   6. Agitated saline contrast bubble study was  negative, with no evidence  of any interatrial shunt.   7. 3D performed of the mitral valve and demonstrates Endocarditis with  perforation of the anterior leaflet and severe eccentric MR.   Conclusion(s)/Recommendation(s): Findings are concerning for  vegetation/infective endocarditis as detailed above. Findings concerning  for mitral valve vegetation. Cardiology evaluation and eventual CT  surgical evaluation recommended. Would be best to  transfer to [facility] for further work-up.   FINDINGS   Left Ventricle: Left ventricular ejection fraction, by estimation, is 65  to 70%. The left ventricle has hyperdynamic function. The left ventricular  internal cavity size was normal in size. There is no left ventricular  hypertrophy.   Right Ventricle: The right ventricular size is normal. No increase in  right ventricular wall thickness. Right ventricular systolic function is  normal.   Left Atrium: Left atrial size was normal in size. No left atrial/left  atrial appendage thrombus was detected. The LAA emptying velocity was 65  cm/s.   Right Atrium: Right atrial size was normal in size.   Pericardium: There is no evidence of pericardial effusion.   Mitral Valve: Mobile vegetation noted on the anterior mitral leaflet with  perforation of the leaflet. There is thickening and probably vegetation of  the posterior leaflet as well. The mitral valve is abnormal. Severe mitral  valve regurgitation, with  eccentric anteriorly directed jet. No evidence of mitral valve stenosis.  Pulmonary venous flow shows systolic flow reversal.   Tricuspid Valve: The tricuspid valve is grossly normal. Tricuspid valve  regurgitation is mild.   Aortic Valve: The aortic valve is tricuspid. Aortic valve regurgitation is  not visualized.   Pulmonic Valve: The pulmonic valve was grossly normal. Pulmonic valve  regurgitation is trivial.   Aorta: The aortic root and ascending aorta are structurally normal,  with  no evidence of dilitation.   Venous: The left upper pulmonary vein is normal.   IAS/Shunts: The interatrial septum is aneurysmal. There is right bowing of  the interatrial septum, suggestive of elevated left atrial pressure. No  atrial level shunt detected by color flow Doppler. Agitated saline  contrast was given intravenously to  evaluate for intracardiac shunting. Agitated saline contrast bubble study  was negative, with no evidence of any interatrial shunt.   Additional Comments: 3D was performed not requiring image post processing  on an independent workstation and was abnormal.   Dinah Franco MD  Electronically signed by Dinah Franco MD  Signature Date/Time: 08/04/2023/1:31:14 PM        Final     Assessment & Plan: Mitral valve endocarditis with severe MR: Vegetations of the anterior and probable posterior mitral valve leaflets with perforation of the anterior leaflet. Severe mitral valve regurgitation as well. Patient would benefit from surgical intervention but would be a high risk candidate due to comorbidities including class III obesity with BMI of 45 and uncontrolled T2DM with A1C of 12.8. Dr. Luna Salinas to ultimately determine surgical candidacy and timing. If  patient is a candidate she will still require cardiac catheterization and orthopantomogram for dental workup.  Sepsis with Streptococcus agalactiae bacteremia: Continue Cefazolin , following repeat blood cultures form 05/03 currently with NGTD HTN: Continue antihypertensives HLD: On Pravastatin  Class III Obesity: BMI 45, recommend weight loss T2DM: Uncontrolled, A1C 12.8. On Semglee  and SSI GAD: On duloxetine  Hx of breast cancer: On Anastrozole   Randa Burton, PA-C 08/04/23   Patient seen and examined, records and echo images reviewed.  Agree with findings noted above.  63 yo woman with a history of hypertension, class III obesity, hyperlipidemia, uncontrolled type 2 diabetes mellitus, generalized  anxiety disorder, and stage III breast cancer, left mastectomy and chronic left arm lymphedema.  She presented with 5 days of fever, chills and fatigue.  Initially treated for left arm cellulitis.  BC + for group B strep.  Echo showed severe MR.  TEE confirmed anterior leaflet perforation/ vegetation and possible posterior leaflet vegetation.  Mitral valve endocarditis with group B strep- likely a dental source with evidence of abscess on orthopantogram.  Needs urgent dental consult.  Afebrile on IV PCN G  No overt heart failure symptoms but is tachycardic this AM.  Will need cath prior to mitral surgery  Type II DM- poorly controlled PTA with A1c of 12.  Still with multiple CBG in 200s- needs tighter control prior to surgery  Will follow  Landon Pinion C. Luna Salinas, MD [facility] [phone]

## 2023-08-04 NOTE — Transfer of Care (Signed)
 Immediate Anesthesia Transfer of Care Note  Patient: Melissa Bray  Procedure(s) Performed: TRANSESOPHAGEAL ECHOCARDIOGRAM  Patient Location: PACU  Anesthesia Type:MAC  Level of Consciousness: drowsy  Airway & Oxygen Therapy: Patient Spontanous Breathing and Patient connected to face mask oxygen  Post-op Assessment: Report given to RN and Post -op Vital signs reviewed and stable  Post vital signs: Reviewed and stable  Last Vitals:  Vitals Value Taken Time  BP    Temp    Pulse 99 08/04/23 1130  Resp    SpO2 96 % 08/04/23 1130  Vitals shown include unfiled device data.  Last Pain:  Vitals:   08/04/23 1035  TempSrc: Temporal  PainSc: 0-No pain         Complications: No notable events documented.

## 2023-08-04 NOTE — Plan of Care (Signed)
   Problem: Education: Goal: Knowledge of General Education information will improve Description: Including pain rating scale, medication(s)/side effects and non-pharmacologic comfort measures Outcome: Progressing   Problem: Coping: Goal: Level of anxiety will decrease Outcome: Progressing

## 2023-08-04 NOTE — Assessment & Plan Note (Signed)
 Continue pravastatin

## 2023-08-04 NOTE — Progress Notes (Signed)
  Progress Note   Patient: Melissa Bray DGL:[id number] DOB: [birth year] DOA: 07/31/2023     2 DOS: the patient was seen and examined on 08/04/2023        Brief hospital course: 63 y.o. F with MO, DM, HTN, HLD and BrCA s/p left mastectomy who presented with sepsis due to left upper extremity cellulitis.     Significant events: 5/1: Admitted with hypotension, fever for arm cellulitis 5/2: BCx positive for GBS in 2/2 5/3: Echo ordered due to murmur 5/4: TTE shows MR, ID and Cardiology recommend TEE 5/5: TEE shows mitral vegetation; CT surgery, ID and Cardiology consulted   Significant studies: 5/4: Echo -- normal EF, mitral regurgitation, likely perforation 5/5: TEE -- mobile vegetation on mitral valve with perforation of leaflet  Significant microbiology data: 5/1 BCx x2: strep agalactiae 5/3 BCx x2: NGTD   Procedures: TTE TEE    Consults: Infectious Disease Cardiology Cardiothoracic surgery      Assessment and Plan: * Mitral valve endocarditis Severe sepsis due to group B strep bacteremia due to left arm cellulitis Presented with tachycardia, fever, hypertension, sofa score 2  Blood cultures positive for streptococcus agalactiae, subsequent TTE showed mitral valve regurgitation, suspect perforation.  TEE confirmed vegetation, severe MR.  CT surgery consultation recommended, transferred to [facility]. -Continue cefazolin  - Consult infectious disease    Perforation of leaflet of mitral valve - Consult cardiology and CT surgery - Hold amlodipine , clonidine , lisinopril , metoprolol  - Preoperative workup for valve replacement per CT surgery - Timing of surgery per CT surgery  History of breast cancer - Continue anastrozole   GAD (generalized anxiety disorder) - Continue duloxetine   Obesity, Class III, BMI 40-49.9 (morbid obesity) Complicates care  HLD (hyperlipidemia) - Continue pravastatin   DM2 (diabetes mellitus, type 2) (HCC) Sugars improve after adjusting  insulin  5/4 - Continue Semglee  nightly - Continue sliding scale correction insulin  - Continue pravastatin   Essential hypertension Blood pressure low normal - Hold amlodipine , lisinopril , clonidine , metoprolol   Hypokalemia Hypomagnesemia - Supplement        Subjective: Patient is feeling okay.  She has no dyspnea, chest discomfort, fever, confusion     Physical Exam: BP 131/86   Pulse (!) 107   Temp 97.9 F (36.6 C) (Temporal)   Resp 18   Ht 5\' 5"  (1.651 m)   Wt 122.9 kg   SpO2 95%   BMI 45.09 kg/m   Adult female, lying in bed, interactive and appropriate Tachycardic, regular, holosystolic murmur, persistent lymphedema in the left arm, no other significant edema, no JVD Respiratory rate normal, lungs clear without rales or wheezes Attention normal, affect appropriate, judgment and insight appear normal    Data Reviewed: Discussed with infectious disease, cardiothoracic surgery, and cardiology Basic metabolic panel shows mild hyponatremia, mild hypokalemia LFTs normal CBC normal TEE report reviewed, summarized above  Family Communication:     Disposition: Status is: Inpatient The patient was admitted initially for sepsis from what was thought to be left arm cellulitis  She was subsequently found to have bacteremia, group B strep, and vegetation on the mitral valve  She has perforation of the valve which will require CT surgery evaluation.  They anticipate this to happen this admission.          Author: Ephriam Hashimoto, MD 08/04/2023 3:28 PM  For on call review [email or web address].

## 2023-08-04 NOTE — Assessment & Plan Note (Signed)
 Continue duloxetine

## 2023-08-04 NOTE — Anesthesia Postprocedure Evaluation (Signed)
 Anesthesia Post Note  Patient: Melissa Bray  Procedure(s) Performed: TRANSESOPHAGEAL ECHOCARDIOGRAM     Patient location during evaluation: Cath Lab Anesthesia Type: MAC Level of consciousness: awake and alert, patient cooperative and oriented Pain management: pain level controlled Vital Signs Assessment: post-procedure vital signs reviewed and stable Respiratory status: spontaneous breathing, nonlabored ventilation and respiratory function stable Cardiovascular status: blood pressure returned to baseline and stable Postop Assessment: no apparent nausea or vomiting Anesthetic complications: no   No notable events documented.  Last Vitals:  Vitals:   08/04/23 1415 08/04/23 1430  BP: 130/85 131/86  Pulse: (!) 106 (!) 107  Resp: 19 18  Temp:    SpO2: 95% 95%    Last Pain:  Vitals:   08/04/23 1200  TempSrc: Temporal  PainSc: 0-No pain                 Elenna Spratling,E. Dayveon Halley

## 2023-08-04 NOTE — Consult Note (Addendum)
 Cardiology Consultation   Patient ID: Melissa Bray MRN: [id number]; DOB: [birth year]  Admit date: 07/31/2023 Date of Consult: 08/04/2023  PCP:  Kandace Organ, NP   [facility] Providers Cardiologist:  Eilleen Grates, MD   Patient Profile:   Melissa Bray is a 63 y.o. female with a history hypertension, hyperlipidemia, type 2 diabetes mellitus, and breast cancer s/p left mastectomy who is being seen 08/04/2023 for the evaluation of mitral valve endocarditis at the request of Dr. Darlyn Eke.  History of Present Illness:   Melissa Bray is a 63 year old female with the above history. No known cardiac history. She had a Myoview in 10/2015 at [facility] when she was being treated for breast cancer. This was low risk and negative for ischemia.   She was admitted To [facility] on 07/31/2023 for severe sepsis felt to be due to left upper extremity cellulitis after presenting with fatigue, decreased appetite, and generalized weakness. She was febrile with temp of 103.2 upon arrival with mild tachycardia and soft BP. She was started on IV fluids and IV antibiotics. Blood cultures came back positive for Group B strep (s. Agalactiae). Upper extremity ultrasound was negative for DVT. TTE showed LVEF of 65-70% with moderate to probably severe mitral regurgitation. The regurgitant jet originated from the proximal portion of the anterior mitral valve leaflet suggesting likely perforate anterior mitral valve leaflet. She underwent TEE today which confirmed a mobile vegetation noted on both leaflets of the mitral valve consistent with acute valvular endocarditis with perforation of the anterior mitral leaflet and severe MR. Cardiology consulted.  Patient was seen after TEE. She is resting comfortably in no acute distress. She states she started having left arm edema and pain that was warm to the touch on 07/29/2023 and then started to develop significant fatigue and weakness as well as  decreased PO intake. However, she denies any cardiac symptoms. No chest pain, shortness of breath, orthopnea, PND, edema, palpitations, lightheadedness, dizziness, or syncope. She denies any fevers at home but was febrile on arrival.   She denies any tobacco, alcohol, drug use. She does have a family history of CAD and states her brother had a MI in his 30s.   Past Medical History:  Diagnosis Date   Anxiety    Breast cancer (HCC)    Diabetes mellitus without complication (HCC)    Heart murmur    age 63   Hyperlipidemia    Hypertension    Type 2 diabetes mellitus without retinopathy (HCC) 06/17/2017    Past Surgical History:  Procedure Laterality Date   ABDOMINAL HYSTERECTOMY     BREAST BIOPSY     MASTECTOMY Left 2016     Home Medications:  Prior to Admission medications   Medication Sig Start Date End Date Taking? Authorizing Provider  alendronate  (FOSAMAX ) 70 MG tablet Take 1 tablet (70 mg total) by mouth once a week. Take with a full glass of water on an empty stomach. 04/04/23  Yes Nche, Connye Delaine, NP  amLODipine  (NORVASC ) 10 MG tablet Take 1 tablet (10 mg total) by mouth at bedtime. Patient taking differently: Take 20 mg by mouth at bedtime. 05/16/23  Yes Nche, Connye Delaine, NP  anastrozole  (ARIMIDEX ) 1 MG tablet Take 1 mg by mouth daily.   Yes [provider]  cloNIDine  (CATAPRES ) 0.1 MG tablet Take 1 tablet (0.1 mg total) by mouth 2 (two) times daily. 04/04/23  Yes Nche, Connye Delaine, NP  glipiZIDE  (GLUCOTROL  XL) 10 MG  24 hr tablet Take 1 tablet (10 mg total) by mouth daily with breakfast. 04/04/23  Yes Nche, Connye Delaine, NP  insulin  glargine (LANTUS ) 100 UNIT/ML Solostar Pen Inject 20 Units into the skin daily. 05/16/23  Yes Nche, Connye Delaine, NP  lisinopril  (ZESTRIL ) 40 MG tablet Take 1 tablet (40 mg total) by mouth daily. 04/04/23  Yes Nche, Connye Delaine, NP  metFORMIN  (GLUCOPHAGE ) 850 MG tablet Take 1 tablet (850 mg total) by mouth 2 (two) times daily with a  meal. 05/28/23  Yes Nche, Connye Delaine, NP  metoprolol  succinate (TOPROL -XL) 100 MG 24 hr tablet Take 1 tablet (100 mg total) by mouth daily. 04/04/23  Yes Nche, Connye Delaine, NP  pravastatin  (PRAVACHOL ) 20 MG tablet Take 1 tablet (20 mg total) by mouth daily. 04/04/23  Yes Nche, Connye Delaine, NP  Vitamin D , Ergocalciferol , (DRISDOL ) 1.25 MG (50000 UNIT) CAPS capsule TAKE 1 CAPSULE ONCE A WEEK FOR 12 WEEKS. AFTER 12 WEEKS SWITCH TO OVER THE COUNTER VITAMIN D  2000 IU 04/04/23  Yes Nche, Connye Delaine, NP  Continuous Glucose Sensor (FREESTYLE LIBRE 3 PLUS SENSOR) MISC Inject 1 Units into the skin every 14 (fourteen) days. Change sensor every 15 days. 06/27/23   Nche, Connye Delaine, NP  Insulin  Pen Needle (PEN NEEDLES) 31G X 6 MM MISC 1 application  by Does not apply route at bedtime. 04/04/23   Nche, Connye Delaine, NP    Inpatient Medications: Scheduled Meds:  [MAR Hold] anastrozole   1 mg Oral Daily   [MAR Hold] DULoxetine   30 mg Oral Daily   [MAR Hold] feeding supplement (GLUCERNA SHAKE)  237 mL Oral TID BM   [MAR Hold] insulin  aspart  0-20 Units Subcutaneous TID WC   [MAR Hold] insulin  aspart  0-5 Units Subcutaneous QHS   [MAR Hold] insulin  glargine-yfgn  20 Units Subcutaneous Q2200   [MAR Hold] potassium chloride   40 mEq Oral BID   [MAR Hold] pravastatin   20 mg Oral Daily   Continuous Infusions:  sodium chloride      [MAR Hold]  ceFAZolin  (ANCEF ) IV 2 g (08/04/23 0412)   [MAR Hold] lactated ringers      And   [MAR Hold] lactated ringers      PRN Meds: [MAR Hold] acetaminophen  **OR** [MAR Hold] acetaminophen , [MAR Hold] melatonin, [MAR Hold] ondansetron  (ZOFRAN ) IV  Allergies:   No Known Allergies  Social History:   Social History   Socioeconomic History   Marital status: Divorced    Spouse name: Not on file   Number of children: 2   Years of education: Not on file   Highest education level: Not on file  Occupational History   Not on file  Tobacco Use   Smoking status: Never     Passive exposure: Never   Smokeless tobacco: Never  Vaping Use   Vaping status: Never Used  Substance and Sexual Activity   Alcohol use: Never   Drug use: Never   Sexual activity: Not Currently    Birth control/protection: Surgical  Other Topics Concern   Not on file  Social History Narrative   Not on file   Social Drivers of Health   Financial Resource Strain: Not on file  Food Insecurity: No Food Insecurity (07/31/2023)   Hunger Vital Sign    Worried About Running Out of Food in the Last Year: Never true    Ran Out of Food in the Last Year: Never true  Transportation Needs: No Transportation Needs (07/31/2023)   PRAPARE - Transportation    Lack of  Transportation (Medical): No    Lack of Transportation (Non-Medical): No  Physical Activity: Not on file  Stress: Not on file  Social Connections: Not on file  Intimate Partner Violence: Not At Risk (07/31/2023)   Humiliation, Afraid, Rape, and Kick questionnaire    Fear of Current or Ex-Partner: No    Emotionally Abused: No    Physically Abused: No    Sexually Abused: No    Family History:   Family History  Problem Relation Age of Onset   Asthma Mother    Cancer Father        prostate cancer   Diabetes Father    Hypertension Father    Cancer Brother        prostate cancer   Asthma Brother    Colon cancer Neg Hx    Colon polyps Neg Hx    Crohn's disease Neg Hx    Esophageal cancer Neg Hx    Rectal cancer Neg Hx    Stomach cancer Neg Hx    Ulcerative colitis Neg Hx      ROS:  Please see the history of present illness.      Physical Exam/Data:   Vitals:   08/04/23 1150 08/04/23 1200 08/04/23 1210 08/04/23 1215  BP: 114/81 123/81 124/79 114/79  Pulse: (!) 117 (!) 116 (!) 114 (!) 113  Resp: (!) 21 12 19 14   Temp:  97.9 F (36.6 C)    TempSrc:  Temporal    SpO2: 97% 97% 94% 97%  Weight:      Height:        Intake/Output Summary (Last 24 hours) at 08/04/2023 1401 Last data filed at 08/04/2023 1125 Gross per 24  hour  Intake 50 ml  Output --  Net 50 ml      08/01/2023    5:00 AM 07/31/2023   11:11 PM 07/31/2023    5:27 PM  Last 3 Weights  Weight (lbs) 270 lb 15.1 oz 266 lb 1.5 oz 260 lb  Weight (kg) 122.9 kg 120.7 kg 117.935 kg     Body mass index is 45.09 kg/m.  General: 64 y.o. African-American female resting comfortably in no acute distress. HEENT: Normocephalic and atraumatic. Sclera clear.  Neck: Supple. No JVD. Heart: Tachycardic with regular rhythm.III/VI holosystolic murmur best heard at the apex with radiation to the left axillary area.  Lungs: No increased work of breathing. Clear to ausculation bilaterally. No wheezes, rhonchi, or rales.  Abdomen: Soft, non-distended, and non-tender to palpation.  Extremities: No lower extremity edema.    Skin: Warm and dry. Neuro: Alert and oriented x3. No focal deficits. Psych: Normal affect. Responds appropriately.   EKG:  The EKG was personally reviewed and demonstrates:  Sinus tachycardia, rate 106 bpm, with Q waves in inferior leads but no acute ischemic changes.  Telemetry:  Telemetry while in the holding area of the cath lab: Sinus tachycardia with rates in the 110s.  Relevant CV Studies:  TTE 08/03/2023: Impressions:  1. Left ventricular ejection fraction, by estimation, is 65 to 70%. The  left ventricle has normal function. The left ventricle has no regional  wall motion abnormalities. Left ventricular diastolic parameters are  indeterminate.   2. Right ventricular systolic function is normal. The right ventricular  size is normal. Tricuspid regurgitation signal is inadequate for assessing  PA pressure.   3. Moderate to probably severe mitral regurgitation. Regurgitant jet  originates from the proximal portion of the anterior mitral valve leaflet  as opposed to the distal  coapation point suggesting likely perforated  anterior mitral valve leaflet. Recommend   TEE to better evaluate. . The mitral valve is abnormal. Moderate to   severe mitral valve regurgitation. No evidence of mitral stenosis.   4. The aortic valve is tricuspid. Aortic valve regurgitation is not  visualized. No aortic stenosis is present.  _______________  TEE 08/04/2023: Impressions: Acute mitral valve endocarditis with perforated anterior mitral leaflet and severe, eccentric MR. No LAA thrombus Aneurysmal IAS, right-bowing, however, no shunting by color doppler or saline microbubble contrast Normal biatrial size LVEF 65-70%  Laboratory Data:  High Sensitivity Troponin:  No results for input(s): "TROPONINIHS" in the last 720 hours.   Chemistry Recent Labs  Lab 08/01/23 0537 08/02/23 0819 08/04/23 0509  NA 132* 134* 130*  K 3.2* 3.8 3.1*  CL 99 99 93*  CO2 24 24 25   GLUCOSE 236* 211* 374*  BUN 11 11 9   CREATININE 0.79 0.73 0.94  CALCIUM 8.6* 8.6* 8.4*  MG 1.5* 1.8  --   GFRNONAA >60 >60 >60  ANIONGAP 9 11 12     Recent Labs  Lab 07/31/23 1734 08/01/23 0537 08/04/23 0509  PROT 8.1 6.5 6.6  ALBUMIN 3.7 2.7* 2.7*  AST 26 20 17   ALT 25 22 13   ALKPHOS 76 50 50  BILITOT 0.5 0.7 0.4   Lipids No results for input(s): "CHOL", "TRIG", "HDL", "LABVLDL", "LDLCALC", "CHOLHDL" in the last 168 hours.  Hematology Recent Labs  Lab 08/01/23 0537 08/02/23 0819 08/04/23 0509  WBC 7.9 5.9 4.8  RBC 4.10 4.29 4.32  HGB 11.7* 12.0 12.1  HCT 35.3* 36.7 36.9  MCV 86.1 85.5 85.4  MCH 28.5 28.0 28.0  MCHC 33.1 32.7 32.8  RDW 12.9 13.2 13.0  PLT 139* 152 198   Thyroid   Recent Labs  Lab 08/01/23 0537  TSH 2.470    BNPNo results for input(s): "BNP", "PROBNP" in the last 168 hours.  DDimer No results for input(s): "DDIMER" in the last 168 hours.   Radiology/Studies:  ECHO TEE Result Date: 08/04/2023    TRANSESOPHOGEAL ECHO REPORT   Patient Name:   Melissa Bray Date of Exam: 08/04/2023 Medical Rec #:  [id number]     Height:       65.0 in Accession #:    [id number]    Weight:       270.9 lb Date of Birth:  March 13, 1961     BSA:           2.251 m Patient Age:    62 years      BP:           120/80 mmHg Patient Gender: F             HR:           121 bpm. Exam Location:  Inpatient Procedure: Transesophageal Echo, 3D Echo, Cardiac Doppler and Color Doppler            (Both Spectral and Color Flow Doppler were utilized during            procedure). Indications:     Mitral Valve Abnormality  History:         Patient has prior history of Echocardiogram examinations, most                  recent 08/03/2023. Mitral Valve Disease, Signs/Symptoms:Murmur;                  Risk Factors:Diabetes and Hypertension.  Sonographer:  Astrid Blamer Referring Phys:  [id number] Aviva Lemmings HILTY Diagnosing Phys: Dinah Franco MD PROCEDURE: After discussion of the risks and benefits of a TEE, an informed consent was obtained from the patient. The transesophogeal probe was passed without difficulty through the esophogus of the patient. Sedation performed by different physician. The patient was monitored while under deep sedation. Anesthestetic sedation was provided intravenously by Anesthesiology: 220mg  of Propofol, 100mg  of Lidocaine. The patient developed no complications during the procedure.  IMPRESSIONS  1. Left ventricular ejection fraction, by estimation, is 65 to 70%. The left ventricle has hyperdynamic function.  2. Right ventricular systolic function is normal. The right ventricular size is normal.  3. No left atrial/left atrial appendage thrombus was detected. The LAA emptying velocity was 65 cm/s.  4. Mobile vegetation noted on the anterior mitral leaflet with perforation of the leaflet. There is thickening and probably vegetation of the posterior leaflet as well. The mitral valve is abnormal. Severe mitral valve regurgitation. No evidence of mitral stenosis.  5. The aortic valve is tricuspid. Aortic valve regurgitation is not visualized.  6. Agitated saline contrast bubble study was negative, with no evidence of any interatrial shunt.  7. 3D performed of the mitral  valve and demonstrates Endocarditis with perforation of the anterior leaflet and severe eccentric MR. Conclusion(s)/Recommendation(s): Findings are concerning for vegetation/infective endocarditis as detailed above. Findings concerning for mitral valve vegetation. Cardiology evaluation and eventual CT surgical evaluation recommended. Would be best to transfer to [facility] for further work-up. FINDINGS  Left Ventricle: Left ventricular ejection fraction, by estimation, is 65 to 70%. The left ventricle has hyperdynamic function. The left ventricular internal cavity size was normal in size. There is no left ventricular hypertrophy. Right Ventricle: The right ventricular size is normal. No increase in right ventricular wall thickness. Right ventricular systolic function is normal. Left Atrium: Left atrial size was normal in size. No left atrial/left atrial appendage thrombus was detected. The LAA emptying velocity was 65 cm/s. Right Atrium: Right atrial size was normal in size. Pericardium: There is no evidence of pericardial effusion. Mitral Valve: Mobile vegetation noted on the anterior mitral leaflet with perforation of the leaflet. There is thickening and probably vegetation of the posterior leaflet as well. The mitral valve is abnormal. Severe mitral valve regurgitation, with eccentric anteriorly directed jet. No evidence of mitral valve stenosis. Pulmonary venous flow shows systolic flow reversal. Tricuspid Valve: The tricuspid valve is grossly normal. Tricuspid valve regurgitation is mild. Aortic Valve: The aortic valve is tricuspid. Aortic valve regurgitation is not visualized. Pulmonic Valve: The pulmonic valve was grossly normal. Pulmonic valve regurgitation is trivial. Aorta: The aortic root and ascending aorta are structurally normal, with no evidence of dilitation. Venous: The left upper pulmonary vein is normal. IAS/Shunts: The interatrial septum is aneurysmal. There is right bowing of the interatrial  septum, suggestive of elevated left atrial pressure. No atrial level shunt detected by color flow Doppler. Agitated saline contrast was given intravenously to evaluate for intracardiac shunting. Agitated saline contrast bubble study was negative, with no evidence of any interatrial shunt. Additional Comments: 3D was performed not requiring image post processing on an independent workstation and was abnormal. Dinah Franco MD Electronically signed by Dinah Franco MD Signature Date/Time: 08/04/2023/1:31:14 PM    Final    EP STUDY Result Date: 08/04/2023 See surgical note for result.  ECHOCARDIOGRAM COMPLETE Result Date: 08/03/2023    ECHOCARDIOGRAM REPORT   Patient Name:   Melissa Bray Date of Exam: 08/03/2023 Medical Rec #:  [id number]     Height:       65.0 in Accession #:    [id number]    Weight:       270.9 lb Date of Birth:  [birth year]     BSA:          2.251 m Patient Age:    62 years      BP:           115/68 mmHg Patient Gender: F             HR:           106 bpm. Exam Location:  Inpatient Procedure: 2D Echo, Cardiac Doppler and Color Doppler (Both Spectral and Color            Flow Doppler were utilized during procedure). Indications:    Bacteremia; R01.1 Murmur  History:        Patient has no prior history of Echocardiogram examinations.                 Signs/Symptoms:Bacteremia; Risk Factors:Hypertension,                 Dyslipidemia and Diabetes. Breast cancer.  Sonographer:    Raynelle Callow RDCS Referring Phys: [id number] CHRISTOPHER P DANFORD  Sonographer Comments: Patient is obese. Image acquisition challenging due to mastectomy. IMPRESSIONS  1. Left ventricular ejection fraction, by estimation, is 65 to 70%. The left ventricle has normal function. The left ventricle has no regional wall motion abnormalities. Left ventricular diastolic parameters are indeterminate.  2. Right ventricular systolic function is normal. The right ventricular size is normal. Tricuspid regurgitation signal is inadequate for  assessing PA pressure.  3. Moderate to probably severe mitral regurgitation. Regurgitant jet originates from the proximal portion of the anterior mitral valve leaflet as opposed to the distal coapation point suggesting likely perforated anterior mitral valve leaflet. Recommend  TEE to better evaluate. . The mitral valve is abnormal. Moderate to severe mitral valve regurgitation. No evidence of mitral stenosis.  4. The aortic valve is tricuspid. Aortic valve regurgitation is not visualized. No aortic stenosis is present. FINDINGS  Left Ventricle: Left ventricular ejection fraction, by estimation, is 65 to 70%. The left ventricle has normal function. The left ventricle has no regional wall motion abnormalities. The left ventricular internal cavity size was normal in size. There is  no left ventricular hypertrophy. Left ventricular diastolic parameters are indeterminate. Right Ventricle: The right ventricular size is normal. Right vetricular wall thickness was not well visualized. Right ventricular systolic function is normal. Tricuspid regurgitation signal is inadequate for assessing PA pressure. Left Atrium: Left atrial size was normal in size. Right Atrium: Right atrial size was normal in size. Pericardium: There is no evidence of pericardial effusion. Mitral Valve: Moderate to probably severe mitral regurgitation. Regurgitant jet originates from the proximal portion of the anterior mitral valve leaflet as opposed to the distal coapation point suggesting likely perforated anterior mitral valve leaflet.  Recommend TEE to better evaluate. The mitral valve is abnormal. There is mild thickening of the mitral valve leaflet(s). There is mild calcification of the mitral valve leaflet(s). Mild mitral annular calcification. Moderate to severe mitral valve regurgitation. No evidence of mitral valve stenosis. MV peak gradient, 12.7 mmHg. The mean mitral valve gradient is 5.0 mmHg. Tricuspid Valve: The tricuspid valve is normal  in structure. Tricuspid valve regurgitation is trivial. No evidence of tricuspid stenosis. Aortic Valve: The aortic valve is tricuspid. Aortic valve regurgitation is not visualized. No aortic  stenosis is present. Pulmonic Valve: The pulmonic valve was not well visualized. Pulmonic valve regurgitation is not visualized. No evidence of pulmonic stenosis. Aorta: The aortic root and ascending aorta are structurally normal, with no evidence of dilitation. Venous: The inferior vena cava was not well visualized. IAS/Shunts: No atrial level shunt detected by color flow Doppler.  LEFT VENTRICLE PLAX 2D LVIDd:         4.10 cm     Diastology LVIDs:         2.80 cm     LV e' medial:    4.24 cm/s LV PW:         0.90 cm     LV E/e' medial:  27.6 LV IVS:        1.00 cm     LV e' lateral:   5.44 cm/s LVOT diam:     2.30 cm     LV E/e' lateral: 21.5 LV SV:         57 LV SV Index:   25 LVOT Area:     4.15 cm  LV Volumes (MOD) LV vol d, MOD A2C: 94.3 ml LV vol d, MOD A4C: 99.8 ml LV vol s, MOD A2C: 29.9 ml LV vol s, MOD A4C: 39.0 ml LV SV MOD A2C:     64.4 ml LV SV MOD A4C:     99.8 ml LV SV MOD BP:      64.5 ml RIGHT VENTRICLE             IVC RV S prime:     12.30 cm/s  IVC diam: 1.60 cm TAPSE (M-mode): 1.9 cm LEFT ATRIUM             Index        RIGHT ATRIUM           Index LA diam:        4.00 cm 1.78 cm/m   RA Area:     10.70 cm LA Vol (A2C):   52.3 ml 23.24 ml/m  RA Volume:   20.70 ml  9.20 ml/m LA Vol (A4C):   52.1 ml 23.15 ml/m LA Biplane Vol: 51.8 ml 23.02 ml/m  AORTIC VALVE             PULMONIC VALVE LVOT Vmax:   95.00 cm/s  PR End Diast Vel: 1.42 msec LVOT Vmean:  60.800 cm/s LVOT VTI:    0.136 m  AORTA Ao Root diam: 2.70 cm Ao Asc diam:  3.20 cm MITRAL VALVE MV Area (PHT): 4.89 cm       SHUNTS MV Area VTI:   2.81 cm       Systemic VTI:  0.14 m MV Peak grad:  12.7 mmHg      Systemic Diam: 2.30 cm MV Mean grad:  5.0 mmHg MV Vmax:       1.78 m/s MV Vmean:      97.1 cm/s MV Decel Time: 155 msec MR Peak grad:    108.2  mmHg MR Mean grad:    67.0 mmHg MR Vmax:         520.00 cm/s MR Vmean:        387.0 cm/s MR PISA:         7.60 cm MR PISA Eff ROA: 56 mm MR PISA Radius:  1.10 cm MV E velocity: 117.00 cm/s MV A velocity: 147.00 cm/s MV E/A ratio:  0.80 Armida Lander MD Electronically signed by Armida Lander MD Signature Date/Time: 08/03/2023/1:16:09 PM  Final    VAS US  UPPER EXTREMITY VENOUS DUPLEX Result Date: 08/01/2023 UPPER VENOUS STUDY  Patient Name:  Melissa Bray  Date of Exam:   08/01/2023 Medical Rec #: [id number]      Accession #:    [id number] Date of Birth: [birth year]      Patient Gender: F Patient Age:   62 years Exam Location:  [facility] Procedure:      VAS US  UPPER EXTREMITY VENOUS DUPLEX Referring Phys: JUSTIN HOWERTER --------------------------------------------------------------------------------  Indications: Swelling Risk Factors: Cancer breast past pregnancy and obesity. Comparison Study: None. Performing Technologist: Estanislao Heimlich  Examination Guidelines: A complete evaluation includes B-mode imaging, spectral Doppler, color Doppler, and power Doppler as needed of all accessible portions of each vessel. Bilateral testing is considered an integral part of a complete examination. Limited examinations for reoccurring indications may be performed as noted.  Right Findings: +----------+------------+---------+-----------+----------+-------+ RIGHT     CompressiblePhasicitySpontaneousPropertiesSummary +----------+------------+---------+-----------+----------+-------+ Subclavian    Full       Yes       Yes                      +----------+------------+---------+-----------+----------+-------+  Left Findings: +----------+------------+---------+-----------+----------+-------+ LEFT      CompressiblePhasicitySpontaneousPropertiesSummary +----------+------------+---------+-----------+----------+-------+ IJV           Full       Yes       Yes                       +----------+------------+---------+-----------+----------+-------+ Subclavian    Full       Yes       Yes                      +----------+------------+---------+-----------+----------+-------+ Axillary      Full       Yes       Yes                      +----------+------------+---------+-----------+----------+-------+ Brachial      Full       Yes       Yes                      +----------+------------+---------+-----------+----------+-------+ Radial        Full       Yes       Yes                      +----------+------------+---------+-----------+----------+-------+ Ulnar         Full       Yes       Yes                      +----------+------------+---------+-----------+----------+-------+ Cephalic      Full                 Yes                      +----------+------------+---------+-----------+----------+-------+ Basilic       Full                 Yes                      +----------+------------+---------+-----------+----------+-------+  Summary:  Right: No evidence of thrombosis in the subclavian.  Left: No evidence of deep vein thrombosis in the upper extremity. No evidence of superficial  vein thrombosis in the upper extremity.  *See table(s) above for measurements and observations.  Diagnosing physician: Delaney Fearing Electronically signed by Delaney Fearing on 08/01/2023 at 4:52:24 PM.    Final    DG Chest 2 View Result Date: 07/31/2023 CLINICAL DATA:  Fever and chills. EXAM: CHEST - 2 VIEW COMPARISON:  November 23, 2019 FINDINGS: The heart size and mediastinal contours are within normal limits. Low lung volumes are noted with mild elevation of the right hemidiaphragm. There is no evidence of acute infiltrate, pleural effusion or pneumothorax. Radiopaque surgical clips are seen overlying the lateral aspect of the upper left hemithorax. The visualized skeletal structures are unremarkable. IMPRESSION: No active cardiopulmonary disease. Electronically Signed   By:  Virgle Grime M.D.   On: 07/31/2023 18:46     Assessment and Plan:   Mitral Valve Endocarditis Perforation of Mitral Valve Leaflets with Severe Mitral Regurgitation  Patient was admitted with severe sepsis and bacteremia. Blood cultures positive for Group B strep (s. Agalactiae). TTE showed LVEF of 65-70% with moderate to probably severe mitral regurgitation. The regurgitant jet originated from the proximal portion of the anterior mitral valve leaflet suggesting likely perforate anterior mitral valve leaflet. She underwent TEE today which confirmed a mobile vegetation noted on both leaflets of the mitral valve consistent with acute valvular endocarditis with perforation of the anterior mitral leaflet and severe MR - Euvolemic on exam. No signs or symptoms of CHF. - Continue antibiotics per primary team and ID - Consult CT surgery. - Will stop IV fluids.   Sinus Tachycardia Currently in sinus tachycardia with rates in the 100s to 110s. However, she states her heart rate is often on the higher side and attributes this to anxiety. Currently tachycardia likely due to infection and the fact that home Toprol -XL and Clonidine  were held on admission. - Potassium 3.1 today. Being repleted by primary team.  - Magnesium  was 1.5 on admission. Repleted and 1.8 on 5/3. Will repeat Magnesium  today.  - TSH normal on admission. - Will hold off on restarting beta-blocker for now given sepsis and soft BP. - Continue to monitor.   Hypertension  BP soft at times but well controlled.  - Continue to hold home Amlodipine , Toprol -XL, and Clonidine  for now.  Otherwise, per primary team: - Sepsis - Bacteremia - Type 2 diabetes mellitus  - Hyperlipidemia - Morbid obesity - Anxiety - History of breast cancer  Risk Assessment/Risk Scores:    For questions or updates, please contact [facility] Please consult [email or web address] for contact info under    Signed, Callie E Goodrich, PA-C  08/04/2023  2:01 PM  History and all data above reviewed.  Patient examined.  I agree with the findings as above.  This lovely patient has a history of mastectomy and some resultant lymphedema of her left arm.  She has developed warmth and tenderness in being managed for cellulitis.  She subsequently found to have bacteremia as above with an echo demonstrating new mitral regurgitation and on the transthoracic echo probable vegetation.  This was confirmed with perforation on the transesophageal.  She is on appropriate antibiotics.  She has not had any prior cardiac history.  We were able to find a distant echo which was unremarkable.  She has been having weakness and fatigue.  She really came in because of the arm discomfort.  She has not had any new shortness of breath, PND or orthopnea.  She has had no new palpitations, presyncope or syncope.  The patient exam  reveals COR: Regular rate and rhythm, 3 out of 6 holosystolic murmur at the apex, no diastolic,  Lungs: Clear to auscultation bilaterally,  Abd: Positive bowel sounds normal frequency pitch, bruits, rebound, guarding, Ext lymphedema of the left arm with mild erythema and tenderness in the left upper arm, no stigmata of endocarditis with no splinter hemorrhages, Janeway lesions.  All available labs, radiology testing, previous records reviewed. Agree with documented assessment and plan.  Endocarditis: Patient has endocarditis and is on antibiotics for management of her cellulitis.  She will need valve replacement.  We have talked to cardiothoracic surgery.  She will be referred for dental evaluation possibly will need extraction.  She does have a history of poor dentition.  We will plan cardiac catheterization prior to surgery.  Not supportive care.  There is no obvious emboli at this point.  She seems to be euvolemic.  Royston Cornea Aniesa Boback  2:56 PM  08/04/2023

## 2023-08-04 NOTE — Assessment & Plan Note (Signed)
-   Consult cardiology and CT surgery - Hold amlodipine , clonidine , lisinopril , metoprolol  - Preoperative workup for valve replacement per CT surgery - Timing of surgery per CT surgery

## 2023-08-05 ENCOUNTER — Other Ambulatory Visit (HOSPITAL_COMMUNITY)

## 2023-08-05 DIAGNOSIS — I059 Rheumatic mitral valve disease, unspecified: Secondary | ICD-10-CM

## 2023-08-05 DIAGNOSIS — B951 Streptococcus, group B, as the cause of diseases classified elsewhere: Secondary | ICD-10-CM | POA: Diagnosis not present

## 2023-08-05 DIAGNOSIS — R7881 Bacteremia: Secondary | ICD-10-CM | POA: Diagnosis not present

## 2023-08-05 LAB — GLUCOSE, CAPILLARY
Glucose-Capillary: 219 mg/dL — ABNORMAL HIGH (ref 70–99)
Glucose-Capillary: 245 mg/dL — ABNORMAL HIGH (ref 70–99)
Glucose-Capillary: 267 mg/dL — ABNORMAL HIGH (ref 70–99)
Glucose-Capillary: 170 mg/dL — ABNORMAL HIGH (ref 70–99)

## 2023-08-05 LAB — MAGNESIUM: Magnesium: 1.5 mg/dL — ABNORMAL LOW (ref 1.7–2.4)

## 2023-08-05 MED ORDER — MAGNESIUM OXIDE -MG SUPPLEMENT 400 (240 MG) MG PO TABS
400.0000 mg | ORAL_TABLET | Freq: Two times a day (BID) | ORAL | Status: DC
  Administered 2023-08-05 – 2023-08-07 (×6): 400 mg via ORAL
  Filled 2023-08-05 (×6): qty 1

## 2023-08-05 MED ORDER — MAGNESIUM SULFATE 2 GM/50ML IV SOLN
2.0000 g | Freq: Once | INTRAVENOUS | Status: AC
  Administered 2023-08-05: 2 g via INTRAVENOUS
  Filled 2023-08-05: qty 50

## 2023-08-05 MED ORDER — METOPROLOL SUCCINATE ER 50 MG PO TB24
50.0000 mg | ORAL_TABLET | Freq: Every day | ORAL | Status: DC
  Administered 2023-08-05 – 2023-08-08 (×4): 50 mg via ORAL
  Filled 2023-08-05 (×4): qty 1

## 2023-08-05 NOTE — Inpatient Diabetes Management (Signed)
 Inpatient Diabetes Program Recommendations  AACE/ADA: New Consensus Statement on Inpatient Glycemic Control (2015)  Target Ranges:  Prepandial:   less than 140 mg/dL      Peak postprandial:   less than 180 mg/dL (1-2 hours)      Critically ill patients:  140 - 180 mg/dL   Lab Results  Component Value Date   GLUCAP 219 (H) 08/05/2023   HGBA1C 12.8 (H) 07/31/2023    Review of Glycemic Control  Latest Reference Range & Units 08/04/23 07:37 08/04/23 12:08 08/04/23 16:46 08/04/23 21:05 08/05/23 06:23  Glucose-Capillary 70 - 99 mg/dL 161 (H) 096 (H) 045 (H) 203 (H) 219 (H)  (H): Data is abnormally high  Diabetes history: DM2 Outpatient Diabetes medications: Glipizide  10 mg every day, Lantus  20 units every day, Metformin  850 mg BID Current orders for Inpatient glycemic control: Semglee  20 units every day, Novolog  0-20 units TID and 0-5 units QHS  Inpatient Diabetes Program Recommendations:    Might consider:  Semglee  24 units every day  Will continue to follow while inpatient.  Thank you, Hays Lipschutz, MSN, CDCES Diabetes Coordinator Inpatient Diabetes Program [phone] (team pager from 8a-5p)

## 2023-08-05 NOTE — Progress Notes (Signed)
 PROGRESS NOTE  Melissa Bray ZOX:[id number] DOB: [birth year] DOA: 07/31/2023 PCP: Kandace Organ, NP   LOS: 3 days   Brief narrative:  Patient is a 63 years old female with past medical history of diabetes mellitus type 2, hypertension, hyperlipidemia, breast cancer status post left mastectomy presented to the hospital with hypotension and fever with left arm redness swelling.    Significant events: 5/1: Admitted with hypotension, fever for arm cellulitis 5/2: BCx positive for group B streptococcus in 2/2 5/3: Echo ordered due to murmur 5/4: TTE shows MR, ID and Cardiology recommend TEE 5/5: TEE shows mitral vegetation; CT surgery, ID and Cardiology consulted  Assessment/Plan: Principal Problem:   Mitral valve endocarditis Active Problems:   Perforation of leaflet of mitral valve   Essential hypertension   DM2 (diabetes mellitus, type 2) (HCC)   HLD (hyperlipidemia)   Severe sepsis (HCC)   Cellulitis of left upper extremity   Obesity, Class III, BMI 40-49.9 (morbid obesity)   GAD (generalized anxiety disorder)   History of breast cancer   * Mitral valve endocarditis with severe MR. Perforation of the anterior valve leaflet. Severe sepsis due to group B strep bacteremia due to left arm cellulitis TTE showed mitral valve regurgitation, suspect perforation. TEE confirmed vegetation, severe MR.  CT surgery on board and recommendation is surgical intervention but very high risk due to morbid obesity and uncontrolled diabetes.  CT surgery to decide on surgical intervention.  On penicillin G at this time.  Follow ID recommendations. continue to hold amlodipine , clonidine  lisinopril  and metoprolol .  Blood cultures negative in 2 days from 08/02/2023.  Temperature max of 99.9 F.    History of breast cancer Continue anastrozole    GAD (generalized anxiety disorder)  Continue duloxetine    Obesity, Class III, BMI 40-49.9 (morbid obesity) Would benefit from ongoing weight loss as  outpatient.   Hyperlipidemia Continue pravastatin    DM2 (diabetes mellitus, type 2), uncontrolled.  Latest hemoglobin A1c of 12.8. Continue Semglee  sliding scale insulin .  Hypomagnesemia.  Magnesium  of 1.5 today.  Will replace with IV and oral magnesium .  Check levels in AM.   Essential hypertension  amlodipine , lisinopril , clonidine , metoprolol  on hold at this time.   Hypokalemia Supplemented.  No potassium levels from today.  Check BMP in AM.      DVT prophylaxis: SCDs Start: 07/31/23 2321   Disposition: Home likely in few days  Status is: Inpatient  Remains inpatient appropriate because: Need for cardiac intervention, IV antibiotics, pending clinical improvement    Code Status:     Code Status: Full Code  Family Communication: None at bedside  Consultants: Infectious Disease Cardiology Cardiothoracic surgery    Procedures: TEE  Anti-infectives:  Penicillin G  Anti-infectives (From admission, onward)    Start     Dose/Rate Route Frequency Ordered Stop   08/04/23 1700  penicillin G potassium 12 Million Units in dextrose  5 % 500 mL CONTINUOUS infusion        12 Million Units 41.7 mL/hr over 12 Hours Intravenous Every 12 hours 08/04/23 1550     08/01/23 2200  vancomycin  (VANCOREADY) IVPB 1250 mg/250 mL  Status:  Discontinued        1,250 mg 166.7 mL/hr over 90 Minutes Intravenous Every 24 hours 08/01/23 0037 08/01/23 0833   08/01/23 1200  ceFAZolin  (ANCEF ) IVPB 2g/100 mL premix  Status:  Discontinued        2 g 200 mL/hr over 30 Minutes Intravenous Every 8 hours 08/01/23 1009 08/04/23 1550   08/01/23  0930  ceFAZolin  (ANCEF ) IVPB 2g/100 mL premix  Status:  Discontinued        2 g 200 mL/hr over 30 Minutes Intravenous Every 8 hours 08/01/23 0833 08/01/23 1009   08/01/23 0200  cefTRIAXone  (ROCEPHIN ) 1 g in sodium chloride  0.9 % 100 mL IVPB  Status:  Discontinued        1 g 200 mL/hr over 30 Minutes Intravenous Every 24 hours 08/01/23 0018 08/01/23 0833    07/31/23 2015  vancomycin  (VANCOCIN ) IVPB 1000 mg/200 mL premix       Placed in "Followed by" Linked Group   1,000 mg 200 mL/hr over 60 Minutes Intravenous  Once 07/31/23 1902 07/31/23 2241   07/31/23 1915  ceFEPIme  (MAXIPIME ) 2 g in sodium chloride  0.9 % 100 mL IVPB        2 g 200 mL/hr over 30 Minutes Intravenous  Once 07/31/23 1900 07/31/23 2006   07/31/23 1915  metroNIDAZOLE  (FLAGYL ) IVPB 500 mg        500 mg 100 mL/hr over 60 Minutes Intravenous  Once 07/31/23 1900 08/01/23 0038   07/31/23 1915  vancomycin  (VANCOCIN ) IVPB 1000 mg/200 mL premix  Status:  Discontinued        1,000 mg 200 mL/hr over 60 Minutes Intravenous  Once 07/31/23 1900 07/31/23 1902   07/31/23 1915  vancomycin  (VANCOCIN ) IVPB 1000 mg/200 mL premix       Placed in "Followed by" Linked Group   1,000 mg 200 mL/hr over 60 Minutes Intravenous  Once 07/31/23 1902 07/31/23 2127       Subjective: Today, patient was seen and examined at bedside.  Patient states that she feels okay.  Denies any chest pain, shortness of breath or dyspnea.  Denies any nausea vomiting fevers or chills.  Objective: Vitals:   08/05/23 0306 08/05/23 0752  BP: 116/81 113/71  Pulse: (!) 102   Resp: 20   Temp: 98.5 F (36.9 C) 98.9 F (37.2 C)  SpO2: 98%     Intake/Output Summary (Last 24 hours) at 08/05/2023 1033 Last data filed at 08/04/2023 1125 Gross per 24 hour  Intake 50 ml  Output --  Net 50 ml   Filed Weights   07/31/23 2311 08/01/23 0500 08/05/23 0425  Weight: 120.7 kg 122.9 kg 117.6 kg   Body mass index is 43.14 kg/m.   Physical Exam:  GENERAL: Patient is alert awake and oriented. Not in obvious distress.  Obese build. HENT: No scleral pallor or icterus. Pupils equally reactive to light. Oral mucosa is moist NECK: is supple, no gross swelling noted. CHEST: Clear to auscultation. No crackles or wheezes.  CVS: S1 and S2 heard, systolic murmur noted.  Regular rate and rhythm.  ABDOMEN: Soft, non-tender, bowel sounds  are present. EXTREMITIES: Lymphedema left arm. CNS: Cranial nerves are intact. No focal motor deficits. SKIN: warm and dry without rashes.  Data Review: I have personally reviewed the following laboratory data and studies,  CBC: Recent Labs  Lab 07/31/23 1734 08/01/23 0537 08/02/23 0819 08/04/23 0509  WBC 9.7 7.9 5.9 4.8  NEUTROABS 7.8* 5.7  --   --   HGB 13.8 11.7* 12.0 12.1  HCT 39.5 35.3* 36.7 36.9  MCV 82.8 86.1 85.5 85.4  PLT 166 139* 152 198   Basic Metabolic Panel: Recent Labs  Lab 07/31/23 0809 07/31/23 1734 08/01/23 0537 08/02/23 0819 08/04/23 0509 08/05/23 0419  NA 131* 131* 132* 134* 130*  --   K 3.6 3.6 3.2* 3.8 3.1*  --  CL 95* 93* 99 99 93*  --   CO2 26 20* 24 24 25   --   GLUCOSE 246* 288* 236* 211* 374*  --   BUN 13 13 11 11 9   --   CREATININE 0.99 1.11* 0.79 0.73 0.94  --   CALCIUM 9.0 9.6 8.6* 8.6* 8.4*  --   MG  --   --  1.5* 1.8  --  1.5*  PHOS 3.1  --   --   --   --   --    Liver Function Tests: Recent Labs  Lab 07/31/23 0809 07/31/23 1734 08/01/23 0537 08/04/23 0509  AST  --  26 20 17   ALT  --  25 22 13   ALKPHOS  --  76 50 50  BILITOT  --  0.5 0.7 0.4  PROT  --  8.1 6.5 6.6  ALBUMIN 3.6 3.7 2.7* 2.7*   No results for input(s): "LIPASE", "AMYLASE" in the last 168 hours. No results for input(s): "AMMONIA" in the last 168 hours. Cardiac Enzymes: No results for input(s): "CKTOTAL", "CKMB", "CKMBINDEX", "TROPONINI" in the last 168 hours. BNP (last 3 results) No results for input(s): "BNP" in the last 8760 hours.  ProBNP (last 3 results) No results for input(s): "PROBNP" in the last 8760 hours.  CBG: Recent Labs  Lab 08/04/23 0737 08/04/23 1208 08/04/23 1646 08/04/23 2105 08/05/23 0623  GLUCAP 229* 131* 134* 203* 219*   Recent Results (from the past 240 hours)  Resp panel by RT-PCR (RSV, Flu A&B, Covid) Anterior Nasal Swab     Status: None   Collection Time: 07/31/23  5:36 PM   Specimen: Anterior Nasal Swab  Result Value  Ref Range Status   SARS Coronavirus 2 by RT PCR NEGATIVE NEGATIVE Final    Comment: (NOTE) SARS-CoV-2 target nucleic acids are NOT DETECTED.  The SARS-CoV-2 RNA is generally detectable in upper respiratory specimens during the acute phase of infection. The lowest concentration of SARS-CoV-2 viral copies this assay can detect is 138 copies/mL. A negative result does not preclude SARS-Cov-2 infection and should not be used as the sole basis for treatment or other patient management decisions. A negative result may occur with  improper specimen collection/handling, submission of specimen other than nasopharyngeal swab, presence of viral mutation(s) within the areas targeted by this assay, and inadequate number of viral copies(<138 copies/mL). A negative result must be combined with clinical observations, patient history, and epidemiological information. The expected result is Negative.  Fact Sheet for Patients:  BloggerCourse.com  Fact Sheet for Healthcare Providers:  SeriousBroker.it  This test is no t yet approved or cleared by the [place]  FDA and  has been authorized for detection and/or diagnosis of SARS-CoV-2 by FDA under an Emergency Use Authorization (EUA). This EUA will remain  in effect (meaning this test can be used) for the duration of the COVID-19 declaration under Section 564(b)(1) of the Act, 21 U.S.C.section 360bbb-3(b)(1), unless the authorization is terminated  or revoked sooner.       Influenza A by PCR NEGATIVE NEGATIVE Final   Influenza B by PCR NEGATIVE NEGATIVE Final    Comment: (NOTE) The Xpert Xpress SARS-CoV-2/FLU/RSV plus assay is intended as an aid in the diagnosis of influenza from Nasopharyngeal swab specimens and should not be used as a sole basis for treatment. Nasal washings and aspirates are unacceptable for Xpert Xpress SARS-CoV-2/FLU/RSV testing.  Fact Sheet for  Patients: BloggerCourse.com  Fact Sheet for Healthcare Providers: SeriousBroker.it  This test is not yet  approved or cleared by the [place]  FDA and has been authorized for detection and/or diagnosis of SARS-CoV-2 by FDA under an Emergency Use Authorization (EUA). This EUA will remain in effect (meaning this test can be used) for the duration of the COVID-19 declaration under Section 564(b)(1) of the Act, 21 U.S.C. section 360bbb-3(b)(1), unless the authorization is terminated or revoked.     Resp Syncytial Virus by PCR NEGATIVE NEGATIVE Final    Comment: (NOTE) Fact Sheet for Patients: BloggerCourse.com  Fact Sheet for Healthcare Providers: SeriousBroker.it  This test is not yet approved or cleared by the [place]  FDA and has been authorized for detection and/or diagnosis of SARS-CoV-2 by FDA under an Emergency Use Authorization (EUA). This EUA will remain in effect (meaning this test can be used) for the duration of the COVID-19 declaration under Section 564(b)(1) of the Act, 21 U.S.C. section 360bbb-3(b)(1), unless the authorization is terminated or revoked.  Performed at [facility], [address] [place], [place] [address]   Blood Culture (routine x 2)     Status: Abnormal   Collection Time: 07/31/23  6:48 PM   Specimen: BLOOD  Result Value Ref Range Status   Specimen Description   Final    BLOOD LEFT ANTECUBITAL Performed at [facility], [address] [place], [place] [address]    Special Requests   Final    BOTTLES DRAWN AEROBIC AND ANAEROBIC Blood Culture adequate volume Performed at [facility], [address] [place], [place] [address]    Culture  Setup Time   Final    GRAM POSITIVE COCCI IN BOTH AEROBIC AND ANAEROBIC BOTTLES CRITICAL VALUE NOTED.  VALUE IS CONSISTENT WITH PREVIOUSLY REPORTED AND CALLED VALUE.     Culture (A)  Final    GROUP B STREP(S.AGALACTIAE)ISOLATED SUSCEPTIBILITIES PERFORMED ON PREVIOUS CULTURE WITHIN THE LAST 5 DAYS. Performed at [facility], [address], [place], [place] [address]    Report Status 08/03/2023 FINAL  Final  Blood Culture (routine x 2)     Status: Abnormal   Collection Time: 07/31/23  7:01 PM   Specimen: BLOOD  Result Value Ref Range Status   Specimen Description   Final    BLOOD RIGHT ANTECUBITAL Performed at [facility], [address] [place], [place] [address]    Special Requests   Final    BOTTLES DRAWN AEROBIC AND ANAEROBIC Blood Culture adequate volume Performed at [facility], [address] [place], [place] [address]    Culture  Setup Time   Final    GRAM POSITIVE COCCI IN BOTH AEROBIC AND ANAEROBIC BOTTLES CRITICAL RESULT CALLED TO, READ BACK BY AND VERIFIED WITH: [facility] MCICHELLE BELL [id number] AT 0816 BY EC Performed at [facility], [address], [place], [place] [address]    Culture GROUP B STREP(S.AGALACTIAE)ISOLATED (A)  Final   Report Status 08/03/2023 FINAL  Final   Organism ID, Bacteria GROUP B STREP(S.AGALACTIAE)ISOLATED  Final      Susceptibility   Group b strep(s.agalactiae)isolated - MIC*    CLINDAMYCIN <=0.25 SENSITIVE Sensitive     AMPICILLIN <=0.25 SENSITIVE Sensitive     ERYTHROMYCIN <=0.12 SENSITIVE Sensitive     VANCOMYCIN  0.5 SENSITIVE Sensitive     CEFTRIAXONE  <=0.12 SENSITIVE Sensitive     LEVOFLOXACIN 1 SENSITIVE Sensitive     PENICILLIN <=0.06 SENSITIVE Sensitive     * GROUP B STREP(S.AGALACTIAE)ISOLATED  Blood Culture ID Panel (  Reflexed)     Status: Abnormal   Collection Time: 07/31/23  7:01 PM  Result Value Ref Range Status   Enterococcus faecalis NOT DETECTED NOT DETECTED Final   Enterococcus Faecium NOT DETECTED NOT DETECTED Final   Listeria monocytogenes NOT DETECTED NOT DETECTED Final   Staphylococcus species NOT DETECTED NOT DETECTED Final   Staphylococcus  aureus (BCID) NOT DETECTED NOT DETECTED Final   Staphylococcus epidermidis NOT DETECTED NOT DETECTED Final   Staphylococcus lugdunensis NOT DETECTED NOT DETECTED Final   Streptococcus species DETECTED (A) NOT DETECTED Final    Comment: CRITICAL RESULT CALLED TO, READ BACK BY AND VERIFIED WITH: PHARMD MICHELLE BELL [id number] AT 0816 BY EC    Streptococcus agalactiae DETECTED (A) NOT DETECTED Final    Comment: CRITICAL RESULT CALLED TO, READ BACK BY AND VERIFIED WITH: PHARMD MICHELLE BELL [id number] AT 0816 BY EC    Streptococcus pneumoniae NOT DETECTED NOT DETECTED Final   Streptococcus pyogenes NOT DETECTED NOT DETECTED Final   A.calcoaceticus-baumannii NOT DETECTED NOT DETECTED Final   Bacteroides fragilis NOT DETECTED NOT DETECTED Final   Enterobacterales NOT DETECTED NOT DETECTED Final   Enterobacter cloacae complex NOT DETECTED NOT DETECTED Final   Escherichia coli NOT DETECTED NOT DETECTED Final   Klebsiella aerogenes NOT DETECTED NOT DETECTED Final   Klebsiella oxytoca NOT DETECTED NOT DETECTED Final   Klebsiella pneumoniae NOT DETECTED NOT DETECTED Final   Proteus species NOT DETECTED NOT DETECTED Final   Salmonella species NOT DETECTED NOT DETECTED Final   Serratia marcescens NOT DETECTED NOT DETECTED Final   Haemophilus influenzae NOT DETECTED NOT DETECTED Final   Neisseria meningitidis NOT DETECTED NOT DETECTED Final   Pseudomonas aeruginosa NOT DETECTED NOT DETECTED Final   Stenotrophomonas maltophilia NOT DETECTED NOT DETECTED Final   Candida albicans NOT DETECTED NOT DETECTED Final   Candida auris NOT DETECTED NOT DETECTED Final   Candida glabrata NOT DETECTED NOT DETECTED Final   Candida krusei NOT DETECTED NOT DETECTED Final   Candida parapsilosis NOT DETECTED NOT DETECTED Final   Candida tropicalis NOT DETECTED NOT DETECTED Final   Cryptococcus neoformans/gattii NOT DETECTED NOT DETECTED Final    Comment: Performed at [facility], [address],  [place], [place] [address]  Culture, [place] Urine     Status: None   Collection Time: 08/01/23 12:27 AM   Specimen: Urine, Random  Result Value Ref Range Status   Specimen Description   Final    URINE, RANDOM Performed at [facility], [address], [place], [place] [address]    Special Requests   Final    NONE Performed at [facility], [address], [place], [place] [address]    Culture   Final    NO GROWTH NO GROUP B STREP (S.AGALACTIAE) ISOLATED Performed at [facility], [address], [place], [place] [address]    Report Status 08/02/2023 FINAL  Final  Culture, blood (Routine X 2) w Reflex to ID Panel     Status: None (Preliminary result)   Collection Time: 08/02/23 11:55 AM   Specimen: BLOOD RIGHT ARM  Result Value Ref Range Status   Specimen Description   Final    BLOOD RIGHT ARM Performed at [facility], [address], [place], [place] [address]    Special Requests   Final    BOTTLES DRAWN AEROBIC AND ANAEROBIC Blood Culture results may not be optimal due to an inadequate volume of blood received in culture bottles Performed  at [facility], [address] [place], [place] [address]    Culture   Final    NO GROWTH 3 DAYS Performed at [facility], [address], [place], [place] [address]    Report Status PENDING  Incomplete  Culture, blood (Routine X 2) w Reflex to ID Panel     Status: None (Preliminary result)   Collection Time: 08/02/23 12:01 PM   Specimen: BLOOD RIGHT HAND  Result Value Ref Range Status   Specimen Description   Final    BLOOD RIGHT HAND Performed at [facility], [address], [place], [place] [address]    Special Requests   Final    BOTTLES DRAWN AEROBIC ONLY Blood Culture results may not be optimal due to an inadequate volume of blood received in culture bottles Performed at [facility], [address] [place], [place] [address]     Culture   Final    NO GROWTH 3 DAYS Performed at [facility], [address] [place], [place] [address]    Report Status PENDING  Incomplete     Studies: DG Orthopantogram Result Date: 08/04/2023 CLINICAL DATA:  History of mitral regurgitation, evaluate for upcoming cardiac surgery EXAM: ORTHOPANTOGRAM/PANORAMIC COMPARISON:  None Available. FINDINGS: Mandible is intact. No acute fracture is noted. Dental caries are noted involving the left maxillary canine with periapical lucency identified. Suggestion of periapical lucency is noted involving the left central and lateral incisors. The first left maxillary molar is noted to be involved with dental caries with only a small residual root left. Similar findings are noted on the right in the mandible involving what appears to be residual molar. No other periapical lucencies are seen. IMPRESSION: Changes suggestive of periapical abscess involving the left maxillary canine with almost complete loss of the tooth. Suspicious. Apical lucency is noted involving left maxillary central and lateral incisors. Similar findings are noted involving a right mandibular molar and left maxillary molar without evidence of periapical lucency. Electronically Signed   By: Violeta Grey M.D.   On: 08/04/2023 19:52   ECHO TEE Result Date: 08/04/2023    TRANSESOPHOGEAL ECHO REPORT   Patient Name:   DARENDA BAUDOIN Date of Exam: 08/04/2023 Medical Rec #:  [id number]     Height:       65.0 in Accession #:    [id number]    Weight:       270.9 lb Date of Birth:  [birth year]     BSA:          2.251 m Patient Age:    62 years      BP:           120/80 mmHg Patient Gender: F             HR:           121 bpm. Exam Location:  Inpatient Procedure: Transesophageal Echo, 3D Echo, Cardiac Doppler and Color Doppler            (Both Spectral and Color Flow Doppler were utilized during            procedure). Indications:     Mitral Valve Abnormality  History:         Patient has prior history of  Echocardiogram examinations, most                  recent 08/03/2023. Mitral Valve Disease, Signs/Symptoms:Murmur;  Risk Factors:Diabetes and Hypertension.  Sonographer:     Astrid Blamer Referring Phys:  [id number] Lemmings HILTY Diagnosing Phys: Dinah Franco MD PROCEDURE: After discussion of the risks and benefits of a TEE, an informed consent was obtained from the patient. The transesophogeal probe was passed without difficulty through the esophogus of the patient. Sedation performed by different physician. The patient was monitored while under deep sedation. Anesthestetic sedation was provided intravenously by Anesthesiology: 220mg  of Propofol, 100mg  of Lidocaine. The patient developed no complications during the procedure.  IMPRESSIONS  1. Left ventricular ejection fraction, by estimation, is 65 to 70%. The left ventricle has hyperdynamic function.  2. Right ventricular systolic function is normal. The right ventricular size is normal.  3. No left atrial/left atrial appendage thrombus was detected. The LAA emptying velocity was 65 cm/s.  4. Mobile vegetation noted on the anterior mitral leaflet with perforation of the leaflet. There is thickening and probably vegetation of the posterior leaflet as well. The mitral valve is abnormal. Severe mitral valve regurgitation. No evidence of mitral stenosis.  5. The aortic valve is tricuspid. Aortic valve regurgitation is not visualized.  6. Agitated saline contrast bubble study was negative, with no evidence of any interatrial shunt.  7. 3D performed of the mitral valve and demonstrates Endocarditis with perforation of the anterior leaflet and severe eccentric MR. Conclusion(s)/Recommendation(s): Findings are concerning for vegetation/infective endocarditis as detailed above. Findings concerning for mitral valve vegetation. Cardiology evaluation and eventual CT surgical evaluation recommended. Would be best to transfer to [facility] for further work-up.  FINDINGS  Left Ventricle: Left ventricular ejection fraction, by estimation, is 65 to 70%. The left ventricle has hyperdynamic function. The left ventricular internal cavity size was normal in size. There is no left ventricular hypertrophy. Right Ventricle: The right ventricular size is normal. No increase in right ventricular wall thickness. Right ventricular systolic function is normal. Left Atrium: Left atrial size was normal in size. No left atrial/left atrial appendage thrombus was detected. The LAA emptying velocity was 65 cm/s. Right Atrium: Right atrial size was normal in size. Pericardium: There is no evidence of pericardial effusion. Mitral Valve: Mobile vegetation noted on the anterior mitral leaflet with perforation of the leaflet. There is thickening and probably vegetation of the posterior leaflet as well. The mitral valve is abnormal. Severe mitral valve regurgitation, with eccentric anteriorly directed jet. No evidence of mitral valve stenosis. Pulmonary venous flow shows systolic flow reversal. Tricuspid Valve: The tricuspid valve is grossly normal. Tricuspid valve regurgitation is mild. Aortic Valve: The aortic valve is tricuspid. Aortic valve regurgitation is not visualized. Pulmonic Valve: The pulmonic valve was grossly normal. Pulmonic valve regurgitation is trivial. Aorta: The aortic root and ascending aorta are structurally normal, with no evidence of dilitation. Venous: The left upper pulmonary vein is normal. IAS/Shunts: The interatrial septum is aneurysmal. There is right bowing of the interatrial septum, suggestive of elevated left atrial pressure. No atrial level shunt detected by color flow Doppler. Agitated saline contrast was given intravenously to evaluate for intracardiac shunting. Agitated saline contrast bubble study was negative, with no evidence of any interatrial shunt. Additional Comments: 3D was performed not requiring image post processing on an independent workstation and  was abnormal. Dinah Franco MD Electronically signed by Dinah Franco MD Signature Date/Time: 08/04/2023/1:31:14 PM    Final    EP STUDY Result Date: 08/04/2023 See surgical note for result.  ECHOCARDIOGRAM COMPLETE Result Date: 08/03/2023    ECHOCARDIOGRAM REPORT   Patient Name:  Raeleen Weimar Date of Exam: 08/03/2023 Medical Rec #:  [id number]     Height:       65.0 in Accession #:    [id number]    Weight:       270.9 lb Date of Birth:  11-05-1960     BSA:          2.251 m Patient Age:    62 years      BP:           115/68 mmHg Patient Gender: F             HR:           106 bpm. Exam Location:  Inpatient Procedure: 2D Echo, Cardiac Doppler and Color Doppler (Both Spectral and Color            Flow Doppler were utilized during procedure). Indications:    Bacteremia; R01.1 Murmur  History:        Patient has no prior history of Echocardiogram examinations.                 Signs/Symptoms:Bacteremia; Risk Factors:Hypertension,                 Dyslipidemia and Diabetes. Breast cancer.  Sonographer:    Raynelle Callow RDCS Referring Phys: [id number] CHRISTOPHER P DANFORD  Sonographer Comments: Patient is obese. Image acquisition challenging due to mastectomy. IMPRESSIONS  1. Left ventricular ejection fraction, by estimation, is 65 to 70%. The left ventricle has normal function. The left ventricle has no regional wall motion abnormalities. Left ventricular diastolic parameters are indeterminate.  2. Right ventricular systolic function is normal. The right ventricular size is normal. Tricuspid regurgitation signal is inadequate for assessing PA pressure.  3. Moderate to probably severe mitral regurgitation. Regurgitant jet originates from the proximal portion of the anterior mitral valve leaflet as opposed to the distal coapation point suggesting likely perforated anterior mitral valve leaflet. Recommend  TEE to better evaluate. . The mitral valve is abnormal. Moderate to severe mitral valve regurgitation. No evidence of mitral  stenosis.  4. The aortic valve is tricuspid. Aortic valve regurgitation is not visualized. No aortic stenosis is present. FINDINGS  Left Ventricle: Left ventricular ejection fraction, by estimation, is 65 to 70%. The left ventricle has normal function. The left ventricle has no regional wall motion abnormalities. The left ventricular internal cavity size was normal in size. There is  no left ventricular hypertrophy. Left ventricular diastolic parameters are indeterminate. Right Ventricle: The right ventricular size is normal. Right vetricular wall thickness was not well visualized. Right ventricular systolic function is normal. Tricuspid regurgitation signal is inadequate for assessing PA pressure. Left Atrium: Left atrial size was normal in size. Right Atrium: Right atrial size was normal in size. Pericardium: There is no evidence of pericardial effusion. Mitral Valve: Moderate to probably severe mitral regurgitation. Regurgitant jet originates from the proximal portion of the anterior mitral valve leaflet as opposed to the distal coapation point suggesting likely perforated anterior mitral valve leaflet.  Recommend TEE to better evaluate. The mitral valve is abnormal. There is mild thickening of the mitral valve leaflet(s). There is mild calcification of the mitral valve leaflet(s). Mild mitral annular calcification. Moderate to severe mitral valve regurgitation. No evidence of mitral valve stenosis. MV peak gradient, 12.7 mmHg. The mean mitral valve gradient is 5.0 mmHg. Tricuspid Valve: The tricuspid valve is normal in structure. Tricuspid valve regurgitation is trivial. No evidence of tricuspid stenosis. Aortic Valve: The aortic valve is  tricuspid. Aortic valve regurgitation is not visualized. No aortic stenosis is present. Pulmonic Valve: The pulmonic valve was not well visualized. Pulmonic valve regurgitation is not visualized. No evidence of pulmonic stenosis. Aorta: The aortic root and ascending aorta are  structurally normal, with no evidence of dilitation. Venous: The inferior vena cava was not well visualized. IAS/Shunts: No atrial level shunt detected by color flow Doppler.  LEFT VENTRICLE PLAX 2D LVIDd:         4.10 cm     Diastology LVIDs:         2.80 cm     LV e' medial:    4.24 cm/s LV PW:         0.90 cm     LV E/e' medial:  27.6 LV IVS:        1.00 cm     LV e' lateral:   5.44 cm/s LVOT diam:     2.30 cm     LV E/e' lateral: 21.5 LV SV:         57 LV SV Index:   25 LVOT Area:     4.15 cm  LV Volumes (MOD) LV vol d, MOD A2C: 94.3 ml LV vol d, MOD A4C: 99.8 ml LV vol s, MOD A2C: 29.9 ml LV vol s, MOD A4C: 39.0 ml LV SV MOD A2C:     64.4 ml LV SV MOD A4C:     99.8 ml LV SV MOD BP:      64.5 ml RIGHT VENTRICLE             IVC RV S prime:     12.30 cm/s  IVC diam: 1.60 cm TAPSE (M-mode): 1.9 cm LEFT ATRIUM             Index        RIGHT ATRIUM           Index LA diam:        4.00 cm 1.78 cm/m   RA Area:     10.70 cm LA Vol (A2C):   52.3 ml 23.24 ml/m  RA Volume:   20.70 ml  9.20 ml/m LA Vol (A4C):   52.1 ml 23.15 ml/m LA Biplane Vol: 51.8 ml 23.02 ml/m  AORTIC VALVE             PULMONIC VALVE LVOT Vmax:   95.00 cm/s  PR End Diast Vel: 1.42 msec LVOT Vmean:  60.800 cm/s LVOT VTI:    0.136 m  AORTA Ao Root diam: 2.70 cm Ao Asc diam:  3.20 cm MITRAL VALVE MV Area (PHT): 4.89 cm       SHUNTS MV Area VTI:   2.81 cm       Systemic VTI:  0.14 m MV Peak grad:  12.7 mmHg      Systemic Diam: 2.30 cm MV Mean grad:  5.0 mmHg MV Vmax:       1.78 m/s MV Vmean:      97.1 cm/s MV Decel Time: 155 msec MR Peak grad:    108.2 mmHg MR Mean grad:    67.0 mmHg MR Vmax:         520.00 cm/s MR Vmean:        387.0 cm/s MR PISA:         7.60 cm MR PISA Eff ROA: 56 mm MR PISA Radius:  1.10 cm MV E velocity: 117.00 cm/s MV A velocity: 147.00 cm/s MV E/A ratio:  0.80 Armida Lander MD Electronically signed by  Armida Lander MD Signature Date/Time: 08/03/2023/1:16:09 PM    Final       Rosena Conradi, MD  [facility] 08/05/2023  If 7PM-7AM, please contact night-coverage

## 2023-08-05 NOTE — Progress Notes (Addendum)
 Patient Name: Melissa Bray Date of Encounter: 08/05/2023 [facility] Cardiologist: Eilleen Grates, MD   Interval Summary  .    She has no complaints, continues to be sinus tachycardia with heart rates in the 120s but asymptomatic from this.  No chest pain or shortness of breath.  Discussed with her about her endocarditis and answered all questions.  Vital Signs .    Vitals:   08/04/23 2340 08/05/23 0306 08/05/23 0425 08/05/23 0752  BP: 110/74 116/81  113/71  Pulse: (!) 110 (!) 102    Resp: 20 20    Temp: 98.6 F (37 C) 98.5 F (36.9 C)  98.9 F (37.2 C)  TempSrc: Oral Oral  Oral  SpO2: 96% 98%    Weight:   117.6 kg   Height:        Intake/Output Summary (Last 24 hours) at 08/05/2023 0957 Last data filed at 08/04/2023 1125 Gross per 24 hour  Intake 50 ml  Output --  Net 50 ml      08/05/2023    4:25 AM 08/01/2023    5:00 AM 07/31/2023   11:11 PM  Last 3 Weights  Weight (lbs) 259 lb 4.2 oz 270 lb 15.1 oz 266 lb 1.5 oz  Weight (kg) 117.6 kg 122.9 kg 120.7 kg      Telemetry/ECG    Sinus cardia, heart rates in the 120s- Personally Reviewed  CV Studies    TEE 08/04/2023 1. Left ventricular ejection fraction, by estimation, is 65 to 70%. The  left ventricle has hyperdynamic function.   2. Right ventricular systolic function is normal. The right ventricular  size is normal.   3. No left atrial/left atrial appendage thrombus was detected. The LAA  emptying velocity was 65 cm/s.   4. Mobile vegetation noted on the anterior mitral leaflet with  perforation of the leaflet. There is thickening and probably vegetation of  the posterior leaflet as well. The mitral valve is abnormal. Severe mitral  valve regurgitation. No evidence of  mitral stenosis.   5. The aortic valve is tricuspid. Aortic valve regurgitation is not  visualized.   6. Agitated saline contrast bubble study was negative, with no evidence  of any interatrial shunt.   7. 3D performed of the mitral  valve and demonstrates Endocarditis with  perforation of the anterior leaflet and severe eccentric MR.   Conclusion(s)/Recommendation(s): Findings are concerning for  vegetation/infective endocarditis as detailed above. Findings concerning  for mitral valve vegetation. Cardiology evaluation and eventual CT  surgical evaluation recommended. Would be best to  transfer to [facility] for further work-up.   Echocardiogram 08/03/2023  1. Left ventricular ejection fraction, by estimation, is 65 to 70%. The  left ventricle has normal function. The left ventricle has no regional  wall motion abnormalities. Left ventricular diastolic parameters are  indeterminate.   2. Right ventricular systolic function is normal. The right ventricular  size is normal. Tricuspid regurgitation signal is inadequate for assessing  PA pressure.   3. Moderate to probably severe mitral regurgitation. Regurgitant jet  originates from the proximal portion of the anterior mitral valve leaflet  as opposed to the distal coapation point suggesting likely perforated  anterior mitral valve leaflet. Recommend   TEE to better evaluate. . The mitral valve is abnormal. Moderate to  severe mitral valve regurgitation. No evidence of mitral stenosis.   4. The aortic valve is tricuspid. Aortic valve regurgitation is not  visualized. No aortic stenosis is present.  Physical Exam .   GEN: No acute distress.   Neck: No JVD Cardiac: 3 out of 6 murmur, apex Respiratory: Clear to auscultation bilaterally. GI: Soft, nontender, non-distended  MS: No edema  Patient Profile    Melissa Bray is a 63 y.o. female has hx of breast cancer status post left mastectomy, hypertension, hyperlipidemia, type 2 diabetes.  Currently being evaluated for MV endocarditis.  Assessment & Plan .     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  Underwent TEE that shows mobile vegetation on both leaflets of MV, severe eccentric MR. Plan now is for valve repair per CT surgery. Echo with preserved LVEF 65 to 70%, euvolemic no complaints of shortness of breath or orthopnea. Antibiotics per infectious disease. Will need cardiac catheterization, will discuss with CT surgery and MD the timing of this. Needs urgent dental evaluation, with evidence of abscess on orthopantogram.  Informed Consent   Shared Decision Making/Informed Consent The risks [stroke (1 in 1000), death (1 in 1000), kidney failure [usually temporary] (1 in 500), bleeding (1 in 200), allergic reaction [possibly serious] (1 in 200)], benefits (diagnostic support and management of coronary artery disease) and alternatives of a cardiac catheterization were discussed in detail with Melissa Bray and she is willing to proceed.   Sinus tachycardia Rates are hovering around 120, likely secondary to the above. Fortunately asymptomatic Has had low magnesium  and potassium, getting repleted by primary team TSH normal Toprol -XL initially held due to soft blood pressure but was on pretty significant dose 100 mg PTA.  BP looks improved today with systolics in the 110s to 130s.  Will add back at 50mg  given concern for decline in hemodynamics but want to avoid abrupt stoppage.   Hypertension BP improved as above. Continue to hold home amlodipine  and clonidine  and lisinopril .   Type 2 diabetes A1c 12.8%, uncontrolled.  Further management per primary team.     For questions or updates, please contact [facility] Please consult [email or web address] for contact info under        Signed, Burnetta Cart, PA-C    History and all data above reviewed.  Patient examined.  I agree with the findings as above.  She feels well.  No pain.  No SOB. The patient exam reveals COR:RRR, systolic murmur unchanged   ,  Lungs: Clear  ,  Abd: Positive bowel sounds, no rebound  no guarding, Ext No edema  .  All available labs, radiology testing, previous records reviewed. Agree with documented assessment and plan. MV endocarditis:  Dental carries.  Oral surgery is aware.  Timing of extraction pending.  Will cath later this week.   Tachycardia:  Start low dose beta blocker.   Melissa Bray  2:03 PM  08/05/2023

## 2023-08-05 NOTE — Consult Note (Signed)
 Reason for Consult:Dental consult pre cardiac surgery Referring Physician: [name] is an 63 y.o. female.  CC:No complaints. Haven't seen a dentist in years.No hot or cold sensitivity, mobile teeth.    HPI: 63 y/o female  HTN, Morbid Obesity, DM2, with severe mitral valve disease.  Past Medical History:  Diagnosis Date   Anxiety    Breast cancer (HCC)    Diabetes mellitus without complication (HCC)    Hyperlipidemia    Hypertension    Type 2 diabetes mellitus without retinopathy (HCC) 06/17/2017    Past Surgical History:  Procedure Laterality Date   ABDOMINAL HYSTERECTOMY     BREAST BIOPSY     MASTECTOMY Left 2016   TRANSESOPHAGEAL ECHOCARDIOGRAM (CATH LAB) N/A 08/04/2023   Procedure: TRANSESOPHAGEAL ECHOCARDIOGRAM;  Surgeon: Hazle Lites, MD;  Location: [facility] INVASIVE CV LAB;  Service: Cardiovascular;  Laterality: N/A;    Family History  Problem Relation Age of Onset   Asthma Mother    Cancer Father        prostate cancer   Diabetes Father    Hypertension Father    Cancer Brother        prostate cancer   Asthma Brother    Colon cancer Neg Hx    Colon polyps Neg Hx    Crohn's disease Neg Hx    Esophageal cancer Neg Hx    Rectal cancer Neg Hx    Stomach cancer Neg Hx    Ulcerative colitis Neg Hx     Social History:  reports that she has never smoked. She has never been exposed to tobacco smoke. She has never used smokeless tobacco. She reports that she does not drink alcohol and does not use drugs.  Allergies: No Known Allergies  Medications: I have reviewed the patient's current medications.  Results for orders placed or performed during the hospital encounter of 07/31/23 (from the past 48 hours)  Glucose, capillary     Status: Abnormal   Collection Time: 08/03/23  4:54 PM  Result Value Ref Range   Glucose-Capillary 197 (H) 70 - 99 mg/dL    Comment: Glucose reference range applies only to samples taken after fasting for at least 8 hours.    Comment 1 Notify RN   Glucose, capillary     Status: Abnormal   Collection Time: 08/03/23  8:37 PM  Result Value Ref Range   Glucose-Capillary 236 (H) 70 - 99 mg/dL    Comment: Glucose reference range applies only to samples taken after fasting for at least 8 hours.  CBC     Status: None   Collection Time: 08/04/23  5:09 AM  Result Value Ref Range   WBC 4.8 4.0 - 10.5 K/uL   RBC 4.32 3.87 - 5.11 MIL/uL   Hemoglobin 12.1 12.0 - 15.0 g/dL   HCT 91.4 78.2 - 95.6 %   MCV 85.4 80.0 - 100.0 fL   MCH 28.0 26.0 - 34.0 pg   MCHC 32.8 30.0 - 36.0 g/dL   RDW 21.3 08.6 - 57.8 %   Platelets 198 150 - 400 K/uL   nRBC 0.0 0.0 - 0.2 %    Comment: Performed at [facility], [address], [place], [place] [address]  Comprehensive metabolic panel with GFR     Status: Abnormal   Collection Time: 08/04/23  5:09 AM  Result Value Ref Range   Sodium 130 (L) 135 - 145 mmol/L   Potassium 3.1 (L) 3.5 - 5.1 mmol/L   Chloride  93 (L) 98 - 111 mmol/L   CO2 25 22 - 32 mmol/L   Glucose, Bld 374 (H) 70 - 99 mg/dL    Comment: Glucose reference range applies only to samples taken after fasting for at least 8 hours.   BUN 9 8 - 23 mg/dL   Creatinine, Ser 1.61 0.44 - 1.00 mg/dL   Calcium 8.4 (L) 8.9 - 10.3 mg/dL   Total Protein 6.6 6.5 - 8.1 g/dL   Albumin 2.7 (L) 3.5 - 5.0 g/dL   AST 17 15 - 41 U/L   ALT 13 0 - 44 U/L   Alkaline Phosphatase 50 38 - 126 U/L   Total Bilirubin 0.4 0.0 - 1.2 mg/dL   GFR, Estimated >09 >60 mL/min    Comment: (NOTE) Calculated using the CKD-EPI Creatinine Equation (2021)    Anion gap 12 5 - 15    Comment: Performed at [facility], [address] [place], [place] [address]  Glucose, capillary     Status: Abnormal   Collection Time: 08/04/23  7:37 AM  Result Value Ref Range   Glucose-Capillary 229 (H) 70 - 99 mg/dL    Comment: Glucose reference range applies only to samples taken after fasting for at least 8 hours.   Comment 1 Notify RN     Comment 2 Document in Chart   Glucose, capillary     Status: Abnormal   Collection Time: 08/04/23 12:08 PM  Result Value Ref Range   Glucose-Capillary 131 (H) 70 - 99 mg/dL    Comment: Glucose reference range applies only to samples taken after fasting for at least 8 hours.  Glucose, capillary     Status: Abnormal   Collection Time: 08/04/23  4:46 PM  Result Value Ref Range   Glucose-Capillary 134 (H) 70 - 99 mg/dL    Comment: Glucose reference range applies only to samples taken after fasting for at least 8 hours.  Glucose, capillary     Status: Abnormal   Collection Time: 08/04/23  9:05 PM  Result Value Ref Range   Glucose-Capillary 203 (H) 70 - 99 mg/dL    Comment: Glucose reference range applies only to samples taken after fasting for at least 8 hours.   Comment 1 Notify RN    Comment 2 Document in Chart   Magnesium      Status: Abnormal   Collection Time: 08/05/23  4:19 AM  Result Value Ref Range   Magnesium  1.5 (L) 1.7 - 2.4 mg/dL    Comment: Performed at [facility], [address], [place], [place] [address]  Glucose, capillary     Status: Abnormal   Collection Time: 08/05/23  6:23 AM  Result Value Ref Range   Glucose-Capillary 219 (H) 70 - 99 mg/dL    Comment: Glucose reference range applies only to samples taken after fasting for at least 8 hours.   Comment 1 Notify RN    Comment 2 Document in Chart   Glucose, capillary     Status: Abnormal   Collection Time: 08/05/23 12:54 PM  Result Value Ref Range   Glucose-Capillary 245 (H) 70 - 99 mg/dL    Comment: Glucose reference range applies only to samples taken after fasting for at least 8 hours.    DG Orthopantogram Result Date: 08/04/2023 CLINICAL DATA:  History of mitral regurgitation, evaluate for upcoming cardiac surgery EXAM: ORTHOPANTOGRAM/PANORAMIC COMPARISON:  None Available. FINDINGS: Mandible is intact. No acute fracture is noted. Dental caries are noted involving the left maxillary  canine with periapical  lucency identified. Suggestion of periapical lucency is noted involving the left central and lateral incisors. The first left maxillary molar is noted to be involved with dental caries with only a small residual root left. Similar findings are noted on the right in the mandible involving what appears to be residual molar. No other periapical lucencies are seen. IMPRESSION: Changes suggestive of periapical abscess involving the left maxillary canine with almost complete loss of the tooth. Suspicious. Apical lucency is noted involving left maxillary central and lateral incisors. Similar findings are noted involving a right mandibular molar and left maxillary molar without evidence of periapical lucency. Electronically Signed   By: Violeta Grey M.D.   On: 08/04/2023 19:52   ECHO TEE Result Date: 08/04/2023    TRANSESOPHOGEAL ECHO REPORT   Patient Name:   Melissa Bray Date of Exam: 08/04/2023 Medical Rec #:  [id number]     Height:       65.0 in Accession #:    [id number]    Weight:       270.9 lb Date of Birth:  September 17, 1960     BSA:          2.251 m Patient Age:    62 years      BP:           120/80 mmHg Patient Gender: F             HR:           121 bpm. Exam Location:  Inpatient Procedure: Transesophageal Echo, 3D Echo, Cardiac Doppler and Color Doppler            (Both Spectral and Color Flow Doppler were utilized during            procedure). Indications:     Mitral Valve Abnormality  History:         Patient has prior history of Echocardiogram examinations, most                  recent 08/03/2023. Mitral Valve Disease, Signs/Symptoms:Murmur;                  Risk Factors:Diabetes and Hypertension.  Sonographer:     Astrid Blamer Referring Phys:  [id number] Aviva Lemmings HILTY Diagnosing Phys: Dinah Franco MD PROCEDURE: After discussion of the risks and benefits of a TEE, an informed consent was obtained from the patient. The transesophogeal probe was passed without difficulty through the esophogus of the patient. Sedation  performed by different physician. The patient was monitored while under deep sedation. Anesthestetic sedation was provided intravenously by Anesthesiology: 220mg  of Propofol, 100mg  of Lidocaine. The patient developed no complications during the procedure.  IMPRESSIONS  1. Left ventricular ejection fraction, by estimation, is 65 to 70%. The left ventricle has hyperdynamic function.  2. Right ventricular systolic function is normal. The right ventricular size is normal.  3. No left atrial/left atrial appendage thrombus was detected. The LAA emptying velocity was 65 cm/s.  4. Mobile vegetation noted on the anterior mitral leaflet with perforation of the leaflet. There is thickening and probably vegetation of the posterior leaflet as well. The mitral valve is abnormal. Severe mitral valve regurgitation. No evidence of mitral stenosis.  5. The aortic valve is tricuspid. Aortic valve regurgitation is not visualized.  6. Agitated saline contrast bubble study was negative, with no evidence of any interatrial shunt.  7. 3D performed of the mitral valve and demonstrates Endocarditis with perforation of the anterior leaflet  and severe eccentric MR. Conclusion(s)/Recommendation(s): Findings are concerning for vegetation/infective endocarditis as detailed above. Findings concerning for mitral valve vegetation. Cardiology evaluation and eventual CT surgical evaluation recommended. Would be best to transfer to [facility] for further work-up. FINDINGS  Left Ventricle: Left ventricular ejection fraction, by estimation, is 65 to 70%. The left ventricle has hyperdynamic function. The left ventricular internal cavity size was normal in size. There is no left ventricular hypertrophy. Right Ventricle: The right ventricular size is normal. No increase in right ventricular wall thickness. Right ventricular systolic function is normal. Left Atrium: Left atrial size was normal in size. No left atrial/left atrial appendage thrombus was  detected. The LAA emptying velocity was 65 cm/s. Right Atrium: Right atrial size was normal in size. Pericardium: There is no evidence of pericardial effusion. Mitral Valve: Mobile vegetation noted on the anterior mitral leaflet with perforation of the leaflet. There is thickening and probably vegetation of the posterior leaflet as well. The mitral valve is abnormal. Severe mitral valve regurgitation, with eccentric anteriorly directed jet. No evidence of mitral valve stenosis. Pulmonary venous flow shows systolic flow reversal. Tricuspid Valve: The tricuspid valve is grossly normal. Tricuspid valve regurgitation is mild. Aortic Valve: The aortic valve is tricuspid. Aortic valve regurgitation is not visualized. Pulmonic Valve: The pulmonic valve was grossly normal. Pulmonic valve regurgitation is trivial. Aorta: The aortic root and ascending aorta are structurally normal, with no evidence of dilitation. Venous: The left upper pulmonary vein is normal. IAS/Shunts: The interatrial septum is aneurysmal. There is right bowing of the interatrial septum, suggestive of elevated left atrial pressure. No atrial level shunt detected by color flow Doppler. Agitated saline contrast was given intravenously to evaluate for intracardiac shunting. Agitated saline contrast bubble study was negative, with no evidence of any interatrial shunt. Additional Comments: 3D was performed not requiring image post processing on an independent workstation and was abnormal. Dinah Franco MD Electronically signed by Dinah Franco MD Signature Date/Time: 08/04/2023/1:31:14 PM    Final    EP STUDY Result Date: 08/04/2023 See surgical note for result.   ROS Blood pressure 119/82, pulse (!) 115, temperature 98.9 F (37.2 C), temperature source Oral, resp. rate 18, height 5\' 5"  (1.651 m), weight 117.6 kg, SpO2 96%. General appearance: alert, cooperative, no distress, and morbidly obese Head: Normocephalic, without obvious abnormality,  atraumatic Eyes: negative Nose: Nares normal. Septum midline. Mucosa normal. No drainage or sinus tenderness. Throat: Dental caries maxillary incisors, no percussion tenderness, mobility. Visible carious retained roots # 11, 14, 30. No oral edema, fluctuance, purulence, erythema.   Neck: no adenopathy  Assessment/Plan: Non-restorable tooth roots  # 11, 14, 30 secondary to dental caries. Extraction with general anesthesia.  Ascencion Lava 08/05/2023, 3:18 PM

## 2023-08-06 DIAGNOSIS — E66813 Obesity, class 3: Secondary | ICD-10-CM

## 2023-08-06 DIAGNOSIS — E871 Hypo-osmolality and hyponatremia: Secondary | ICD-10-CM

## 2023-08-06 DIAGNOSIS — A419 Sepsis, unspecified organism: Secondary | ICD-10-CM | POA: Diagnosis not present

## 2023-08-06 DIAGNOSIS — F411 Generalized anxiety disorder: Secondary | ICD-10-CM | POA: Diagnosis not present

## 2023-08-06 DIAGNOSIS — R000 Tachycardia, unspecified: Secondary | ICD-10-CM

## 2023-08-06 DIAGNOSIS — I059 Rheumatic mitral valve disease, unspecified: Secondary | ICD-10-CM | POA: Diagnosis not present

## 2023-08-06 DIAGNOSIS — I340 Nonrheumatic mitral (valve) insufficiency: Secondary | ICD-10-CM | POA: Diagnosis not present

## 2023-08-06 DIAGNOSIS — I10 Essential (primary) hypertension: Secondary | ICD-10-CM | POA: Diagnosis not present

## 2023-08-06 DIAGNOSIS — E876 Hypokalemia: Secondary | ICD-10-CM

## 2023-08-06 DIAGNOSIS — I339 Acute and subacute endocarditis, unspecified: Secondary | ICD-10-CM | POA: Diagnosis not present

## 2023-08-06 HISTORY — DX: Hypo-osmolality and hyponatremia: E87.1

## 2023-08-06 HISTORY — DX: Hypokalemia: E87.6

## 2023-08-06 LAB — CBC
HCT: 35 % — ABNORMAL LOW (ref 36.0–46.0)
Hemoglobin: 11.7 g/dL — ABNORMAL LOW (ref 12.0–15.0)
MCH: 28.1 pg (ref 26.0–34.0)
MCHC: 33.4 g/dL (ref 30.0–36.0)
MCV: 84.1 fL (ref 80.0–100.0)
Platelets: 235 10*3/uL (ref 150–400)
RBC: 4.16 MIL/uL (ref 3.87–5.11)
RDW: 13.3 % (ref 11.5–15.5)
WBC: 6.6 10*3/uL (ref 4.0–10.5)
nRBC: 0 % (ref 0.0–0.2)

## 2023-08-06 LAB — BASIC METABOLIC PANEL WITH GFR
Anion gap: 9 (ref 5–15)
BUN: 8 mg/dL (ref 8–23)
CO2: 25 mmol/L (ref 22–32)
Calcium: 9.2 mg/dL (ref 8.9–10.3)
Chloride: 100 mmol/L (ref 98–111)
Creatinine, Ser: 0.67 mg/dL (ref 0.44–1.00)
GFR, Estimated: >60 mL/min (ref >60)
Glucose, Bld: 236 mg/dL — ABNORMAL HIGH (ref 70–99)
Potassium: 4.2 mmol/L (ref 3.5–5.1)
Sodium: 134 mmol/L — ABNORMAL LOW (ref 135–145)

## 2023-08-06 LAB — GLUCOSE, CAPILLARY
Glucose-Capillary: 208 mg/dL — ABNORMAL HIGH (ref 70–99)
Glucose-Capillary: 226 mg/dL — ABNORMAL HIGH (ref 70–99)
Glucose-Capillary: 236 mg/dL — ABNORMAL HIGH (ref 70–99)
Glucose-Capillary: 126 mg/dL — ABNORMAL HIGH (ref 70–99)

## 2023-08-06 LAB — MAGNESIUM: Magnesium: 1.7 mg/dL (ref 1.7–2.4)

## 2023-08-06 MED ORDER — INSULIN ASPART 100 UNIT/ML IJ SOLN
5.0000 [IU] | Freq: Three times a day (TID) | INTRAMUSCULAR | Status: DC
  Administered 2023-08-06 – 2023-08-07 (×2): 5 [IU] via SUBCUTANEOUS

## 2023-08-06 MED ORDER — MAGNESIUM SULFATE 2 GM/50ML IV SOLN
2.0000 g | Freq: Once | INTRAVENOUS | Status: AC
  Administered 2023-08-06: 2 g via INTRAVENOUS
  Filled 2023-08-06: qty 50

## 2023-08-06 MED ORDER — INSULIN GLARGINE-YFGN 100 UNIT/ML ~~LOC~~ SOLN
23.0000 [IU] | Freq: Every day | SUBCUTANEOUS | Status: DC
  Administered 2023-08-06 – 2023-08-07 (×2): 23 [IU] via SUBCUTANEOUS
  Filled 2023-08-06 (×4): qty 0.23

## 2023-08-06 NOTE — Inpatient Diabetes Management (Signed)
 Inpatient Diabetes Program Recommendations  AACE/ADA: New Consensus Statement on Inpatient Glycemic Control   Target Ranges:  Prepandial:   less than 140 mg/dL      Peak postprandial:   less than 180 mg/dL (1-2 hours)      Critically ill patients:  140 - 180 mg/dL    Latest Reference Range & Units 08/05/23 06:23 08/05/23 12:54 08/05/23 16:01 08/05/23 21:15 08/06/23 06:18  Glucose-Capillary 70 - 99 mg/dL 161 (H) 096 (H) 045 (H) 170 (H) 208 (H)    Latest Reference Range & Units 07/31/23 08:09  Hemoglobin A1C 4.6 - 6.5 % 12.8 (H)   Review of Glycemic Control  Diabetes history: DM2 Outpatient Diabetes medications: Glipizide  XL 10 mg BID, Lantus  20 units daily, Metformin  850 mg BID, FreeStyle Libre3 CGM Current orders for Inpatient glycemic control: Semglee  20 units at bedtime, Novolog  0-20 units TID with meals, Novolog  0-5 units QHS  Inpatient Diabetes Program Recommendations:    Insulin : Please consider increasing Semglee  to 23 units at bedtime and adding Novolog  5 units TID with meals for meal coverage if patient eats at least 50% of meals.  HbgA1C:  A1C 12.8% on 07/31/23. Inpatient diabetes coordinator spoke to patient on 08/01/23 regarding DM control and A1C.  Thanks, Beacher Limerick, RN, MSN, CDCES Diabetes Coordinator Inpatient Diabetes Program [phone] (Team Pager from 8am to 5pm)

## 2023-08-06 NOTE — Progress Notes (Signed)
 2 Days Post-Op Procedure(s) (LRB): TRANSESOPHAGEAL ECHOCARDIOGRAM (N/A) Subjective: No complaints  Objective: Vital signs in last 24 hours: Temp:  [97.6 F (36.4 C)-98.9 F (37.2 C)] 98.3 F (36.8 C) (05/07 0723) Pulse Rate:  [71-95] 93 (05/07 0723) Cardiac Rhythm: Sinus tachycardia;Heart block (05/07 0701) Resp:  [19-20] 19 (05/07 0723) BP: (100-123)/(70-74) 100/74 (05/07 0723) SpO2:  [97 %-100 %] 100 % (05/07 0723) Weight:  [117.8 kg] 117.8 kg (05/07 0340)  Hemodynamic parameters for last 24 hours:    Intake/Output from previous day: 05/06 0701 - 05/07 0700 In: 1397.7 [IV Piggyback:1397.7] Out: -  Intake/Output this shift: No intake/output data recorded.  General appearance: alert, cooperative, and no distress Neurologic: intact  Lab Results: Recent Labs    08/04/23 0509 08/06/23 1046  WBC 4.8 6.6  HGB 12.1 11.7*  HCT 36.9 35.0*  PLT 198 235   BMET:  Recent Labs    08/04/23 0509 08/06/23 1046  NA 130* 134*  K 3.1* 4.2  CL 93* 100  CO2 25 25  GLUCOSE 374* 236*  BUN 9 8  CREATININE 0.94 0.67  CALCIUM 8.4* 9.2    PT/INR: No results for input(s): "LABPROT", "INR" in the last 72 hours. ABG    Component Value Date/Time   HCO3 28.5 (H) 08/01/2023 0537   TCO2 30 09/16/2021 1624   O2SAT 49.6 08/01/2023 0537   CBG (last 3)  Recent Labs    08/05/23 2115 08/06/23 0618 08/06/23 1114  GLUCAP 170* 208* 226*    Assessment/Plan: S/P Procedure(s) (LRB): TRANSESOPHAGEAL ECHOCARDIOGRAM (N/A) Mitral endocarditis on PCN G For dental extractions tomorrow Hopefully can cath Friday or over the weekend and proceed with surgery early next week   LOS: 4 days    [name] 08/06/2023

## 2023-08-06 NOTE — Progress Notes (Addendum)
 Patient Name: Melissa Bray Date of Encounter: 08/06/2023 [facility] Cardiologist: Eilleen Grates, MD   Interval Summary  .    Heart rates are improved today, no complaints no chest pain or shortness of breath.  Still waiting dental extraction.  Vital Signs .    Vitals:   08/05/23 1958 08/05/23 2352 08/06/23 0340 08/06/23 0723  BP: 112/73 112/70 101/72 100/74  Pulse: 95 95  93  Resp: 20 20 20 19   Temp: 98.6 F (37 C) 98.7 F (37.1 C) 98.9 F (37.2 C) 98.3 F (36.8 C)  TempSrc: Oral Oral Oral Oral  SpO2: 100% 100% 100% 100%  Weight:   117.8 kg   Height:        Intake/Output Summary (Last 24 hours) at 08/06/2023 1004 Last data filed at 08/06/2023 0300 Gross per 24 hour  Intake 1397.65 ml  Output --  Net 1397.65 ml      08/06/2023    3:40 AM 08/05/2023    4:25 AM 08/01/2023    5:00 AM  Last 3 Weights  Weight (lbs) 259 lb 11.2 oz 259 lb 4.2 oz 270 lb 15.1 oz  Weight (kg) 117.8 kg 117.6 kg 122.9 kg      Telemetry/ECG    Sinus rhythm heart rates 80s to 90s- Personally Reviewed  CV Studies    TEE 08/04/2023 1. Left ventricular ejection fraction, by estimation, is 65 to 70%. The  left ventricle has hyperdynamic function.   2. Right ventricular systolic function is normal. The right ventricular  size is normal.   3. No left atrial/left atrial appendage thrombus was detected. The LAA  emptying velocity was 65 cm/s.   4. Mobile vegetation noted on the anterior mitral leaflet with  perforation of the leaflet. There is thickening and probably vegetation of  the posterior leaflet as well. The mitral valve is abnormal. Severe mitral  valve regurgitation. No evidence of  mitral stenosis.   5. The aortic valve is tricuspid. Aortic valve regurgitation is not  visualized.   6. Agitated saline contrast bubble study was negative, with no evidence  of any interatrial shunt.   7. 3D performed of the mitral valve and demonstrates Endocarditis with  perforation of the  anterior leaflet and severe eccentric MR.   Conclusion(s)/Recommendation(s): Findings are concerning for  vegetation/infective endocarditis as detailed above. Findings concerning  for mitral valve vegetation. Cardiology evaluation and eventual CT  surgical evaluation recommended. Would be best to  transfer to [facility] for further work-up.   Echocardiogram 08/03/2023  1. Left ventricular ejection fraction, by estimation, is 65 to 70%. The  left ventricle has normal function. The left ventricle has no regional  wall motion abnormalities. Left ventricular diastolic parameters are  indeterminate.   2. Right ventricular systolic function is normal. The right ventricular  size is normal. Tricuspid regurgitation signal is inadequate for assessing  PA pressure.   3. Moderate to probably severe mitral regurgitation. Regurgitant jet  originates from the proximal portion of the anterior mitral valve leaflet  as opposed to the distal coapation point suggesting likely perforated  anterior mitral valve leaflet. Recommend   TEE to better evaluate. . The mitral valve is abnormal. Moderate to  severe mitral valve regurgitation. No evidence of mitral stenosis.   4. The aortic valve is tricuspid. Aortic valve regurgitation is not  visualized. No aortic stenosis is present.     Physical Exam .   GEN: No acute distress.   Neck: No JVD Cardiac: 3 out  of 6 murmur, apex Respiratory: Clear to auscultation bilaterally. GI: Soft, nontender, non-distended  MS: No edema  Patient Profile    Melissa Bray is a 63 y.o. female has hx of breast cancer status post left mastectomy, hypertension, hyperlipidemia, type 2 diabetes.  Currently being evaluated for MV endocarditis.  Assessment & Plan .     MV endocarditis Severe MR with perforation of MV leaflet Presented with severe sepsis secondary to cellulitis of upper extremity with positive blood cultures for group B strep. TTE with severe MR/perforation..   Underwent TEE that shows mobile vegetation on both leaflets of MV, severe eccentric MR. Plan now is for valve repair per CT surgery. Echo with preserved LVEF 65 to 70%, euvolemic no complaints of shortness of breath or orthopnea. Antibiotics per infectious disease. Bray need cardiac catheterization, Bray discuss with CT surgery and MD the timing of this.  Plan is sometime this weekup. Dental abscesses noted, awaiting dental extraction per oral surgery.  Sinus tachycardia Heart rates are much improved today no longer tachycardic.  Electrolytes repleted. TSH normal Continue with Toprol -XL 50 mg  Hypertension Soft blood seems stable.   Continue to hold home amlodipine  and clonidine  and lisinopril .   Type 2 diabetes A1c 12.8%, uncontrolled.  Further management per primary team.  Hyperlipidemia On pravastatin  20 mg, check LDL.  Informed Consent   Shared Decision Making/Informed Consent The risks [stroke (1 in 1000), death (1 in 1000), kidney failure [usually temporary] (1 in 500), bleeding (1 in 200), allergic reaction [possibly serious] (1 in 200)], benefits (diagnostic support and management of coronary artery disease) and alternatives of a cardiac catheterization were discussed in detail with Melissa Bray and she is willing to proceed.  She has no labs ordered for today.  Bray check CBC and BMP today.  Bray also repeat magnesium  today.      For questions or updates, please contact [facility] Please consult [email or web address] for contact info under        Signed, Burnetta Cart, PA-C    History and all data above reviewed.  Patient examined.  I agree with the findings as above.  No pain.  No SOB.  The patient exam reveals COR:RRR , murmur unchanged ,  Lungs: CTA  ,  Abd: Positive bowel sounds, no rebound no guarding, Ext No edema   .  All available labs, radiology testing, previous records reviewed. Agree with documented assessment and plan.   Endocarditis:  Teeth are to be  extracted tomorrow and we Bray plan cardiac cath for Friday .  Discussed with patient as above.  Tachycardia:  Improved with beta blocker as above.   Melissa Bray Melissa Bray  11:24 AM  08/06/2023

## 2023-08-06 NOTE — Progress Notes (Signed)
 PROGRESS NOTE    Melissa Bray  ZOX:[id number] DOB: December 18, 1960 DOA: 07/31/2023 PCP: Kandace Organ, NP   Chief Complaint  Patient presents with   Fatigue    Brief Narrative:  Patient is a 63 years old female with past medical history of diabetes mellitus type 2, hypertension, hyperlipidemia, breast cancer status post left mastectomy presented to the hospital with hypotension and fever with left arm redness swelling.     Significant events: 5/1: Admitted with hypotension, fever for arm cellulitis 5/2: BCx positive for group B streptococcus in 2/2 5/3: Echo ordered due to murmur 5/4: TTE shows MR, ID and Cardiology recommend TEE 5/5: TEE shows mitral vegetation; CT surgery, ID and Cardiology consulted   Assessment & Plan:   Principal Problem:   Mitral valve endocarditis Active Problems:   Perforation of leaflet of mitral valve   Essential hypertension   DM2 (diabetes mellitus, type 2) (HCC)   HLD (hyperlipidemia)   Severe sepsis (HCC)   Cellulitis of left upper extremity   Obesity, Class III, BMI 40-49.9 (morbid obesity)   GAD (generalized anxiety disorder)   History of breast cancer   Hypomagnesemia   Hypokalemia   Hyponatremia   Sinus tachycardia   #1 mitral valve endocarditis with severe MR/perforation of anterior valve leaflet/severe sepsis secondary to group B strep bacteremia/left upper extremity cellulitis - 2D echo done showed MVR, suspected perforation. - TEE done consistent with a mobile vegetation on mitral valve and severe MR and perforated anterior mitral valve leaflet. Blood cultures from 07/31/2023 grew group B strep agalactiae. - Repeat blood cultures from 08/02/2023 with no growth to date. - Orthopantogram with left maxillary periapical abscess noted. - Patient seen by CT surgery, urgent dental consultation placed and patient for dental extraction tomorrow 08/07/2023. - Was on IV cefazolin  which has been discontinued per ID recommendations and patient  currently on IV penicillin G. - Patient seen by CT surgery who are recommending mitral surgery and also recommending cardiac catheterization prior to surgical procedure. - Cardiology, ID, CT surgery following.  2.  Sinus tachycardia -Likely secondary to problem #1. - Improved. - Continue Toprol -XL.  3.  History of breast cancer -Continue anastrozole .  4.  Generalized anxiety disorder -Duloxetine .  5.  Hyperlipidemia -Continue statin.  6.  Diabetes mellitus type 2, uncontrolled -Hemoglobin A1c 12.8.(07/31/2023) - CBG 208 this morning. - Increase Semglee  to 23 units daily. - Patient to be n.p.o. after midnight in anticipation of dental extraction and as such we will hold off on meal coverage NovoLog  for now. - SSI.  7.  Hypomagnesemia -Magnesium  at 1.7 today. - Magnesium  sulfate 2 g IV x 1. - Continue oral magnesium . - Repeat labs in the morning.  8.  Hypertension -BP noted to be soft. - Currently on Toprol -XL. - Continue to hold home regimen amlodipine , clonidine . - Follow.  9.  Hypokalemia -Repleted.  10.  Hyponatremia -Improving with hydration.  11.  Obesity class III -BMI 43.22 kg/m. -Lifestyle modification. - Outpatient follow-up with PCP.    DVT prophylaxis: SCDs Code Status: Full Family Communication: Updated patient.  No family at bedside. Disposition: TBD  Status is: Inpatient Remains inpatient appropriate because: Severity of illness   Consultants:  Cardiology: Dr. Lavonne Prairie 08/04/2023 Cardiothoracic surgery: Dr. Luna Salinas 08/04/2023 Infectious disease: Dr. Zelda Hickman 08/05/2023 Oral surgeon: Dr. Juleen Oakland 08/05/2023  Procedures:  Chest x-ray 07/31/2023 Orthopantogram 08/04/2023 2D echo 08/03/2023 TEE 08/04/2023 Left upper extremity venous Dopplers 08/01/2023  Antimicrobials:  Anti-infectives (From admission, onward)    Start  Dose/Rate Route Frequency Ordered Stop   08/04/23 1700  penicillin G potassium 12 Million Units in dextrose  5 % 500 mL CONTINUOUS  infusion        12 Million Units 41.7 mL/hr over 12 Hours Intravenous Every 12 hours 08/04/23 1550     08/01/23 2200  vancomycin  (VANCOREADY) IVPB 1250 mg/250 mL  Status:  Discontinued        1,250 mg 166.7 mL/hr over 90 Minutes Intravenous Every 24 hours 08/01/23 0037 08/01/23 0833   08/01/23 1200  ceFAZolin  (ANCEF ) IVPB 2g/100 mL premix  Status:  Discontinued        2 g 200 mL/hr over 30 Minutes Intravenous Every 8 hours 08/01/23 1009 08/04/23 1550   08/01/23 0930  ceFAZolin  (ANCEF ) IVPB 2g/100 mL premix  Status:  Discontinued        2 g 200 mL/hr over 30 Minutes Intravenous Every 8 hours 08/01/23 0833 08/01/23 1009   08/01/23 0200  cefTRIAXone  (ROCEPHIN ) 1 g in sodium chloride  0.9 % 100 mL IVPB  Status:  Discontinued        1 g 200 mL/hr over 30 Minutes Intravenous Every 24 hours 08/01/23 0018 08/01/23 0833   07/31/23 2015  vancomycin  (VANCOCIN ) IVPB 1000 mg/200 mL premix       Placed in "Followed by" Linked Group   1,000 mg 200 mL/hr over 60 Minutes Intravenous  Once 07/31/23 1902 07/31/23 2241   07/31/23 1915  ceFEPIme  (MAXIPIME ) 2 g in sodium chloride  0.9 % 100 mL IVPB        2 g 200 mL/hr over 30 Minutes Intravenous  Once 07/31/23 1900 07/31/23 2006   07/31/23 1915  metroNIDAZOLE  (FLAGYL ) IVPB 500 mg        500 mg 100 mL/hr over 60 Minutes Intravenous  Once 07/31/23 1900 08/01/23 0038   07/31/23 1915  vancomycin  (VANCOCIN ) IVPB 1000 mg/200 mL premix  Status:  Discontinued        1,000 mg 200 mL/hr over 60 Minutes Intravenous  Once 07/31/23 1900 07/31/23 1902   07/31/23 1915  vancomycin  (VANCOCIN ) IVPB 1000 mg/200 mL premix       Placed in "Followed by" Linked Group   1,000 mg 200 mL/hr over 60 Minutes Intravenous  Once 07/31/23 1902 07/31/23 2127         Subjective: Patient laying in bed.  Denies any chest pain or shortness of breath.  No abdominal pain.  Stated just got off the phone with a representative from the dentist and dental extraction will be done  tomorrow.  Objective: Vitals:   08/06/23 0340 08/06/23 0723 08/06/23 1603 08/06/23 1955  BP: 101/72 100/74 108/70 100/64  Pulse:  93 88 88  Resp: 20 19 18 20   Temp: 98.9 F (37.2 C) 98.3 F (36.8 C) 98.5 F (36.9 C) 98.5 F (36.9 C)  TempSrc: Oral Oral Oral Oral  SpO2: 100% 100%  97%  Weight: 117.8 kg     Height:        Intake/Output Summary (Last 24 hours) at 08/06/2023 2103 Last data filed at 08/06/2023 0300 Gross per 24 hour  Intake 1397.65 ml  Output --  Net 1397.65 ml   Filed Weights   08/01/23 0500 08/05/23 0425 08/06/23 0340  Weight: 122.9 kg 117.6 kg 117.8 kg    Examination:  General exam: Appears calm and comfortable  Respiratory system: Clear to auscultation.  No wheezes, no crackles, no rhonchi.  Fair air movement.  Speaking in full sentences.  Respiratory effort normal. Cardiovascular system:  RRR. 3/6 SEM LLSB. No JVD, murmurs, rubs, gallops or clicks. No pedal edema. Gastrointestinal system: Abdomen is nondistended, soft and nontender. No organomegaly or masses felt. Normal bowel sounds heard. Central nervous system: Alert and oriented. No focal neurological deficits. Extremities: Left upper extremity tightness, swelling, decreased erythema.  Symmetric 5 x 5 power. Skin: No rashes, lesions or ulcers Psychiatry: Judgement and insight appear normal. Mood & affect appropriate.     Data Reviewed: I have personally reviewed following labs and imaging studies  CBC: Recent Labs  Lab 07/31/23 1734 08/01/23 0537 08/02/23 0819 08/04/23 0509 08/06/23 1046  WBC 9.7 7.9 5.9 4.8 6.6  NEUTROABS 7.8* 5.7  --   --   --   HGB 13.8 11.7* 12.0 12.1 11.7*  HCT 39.5 35.3* 36.7 36.9 35.0*  MCV 82.8 86.1 85.5 85.4 84.1  PLT 166 139* 152 198 235    Basic Metabolic Panel: Recent Labs  Lab 07/31/23 0809 07/31/23 1734 08/01/23 0537 08/02/23 0819 08/04/23 0509 08/05/23 0419 08/06/23 1046  NA 131* 131* 132* 134* 130*  --  134*  K 3.6 3.6 3.2* 3.8 3.1*  --  4.2  CL  95* 93* 99 99 93*  --  100  CO2 26 20* 24 24 25   --  25  GLUCOSE 246* 288* 236* 211* 374*  --  236*  BUN 13 13 11 11 9   --  8  CREATININE 0.99 1.11* 0.79 0.73 0.94  --  0.67  CALCIUM 9.0 9.6 8.6* 8.6* 8.4*  --  9.2  MG  --   --  1.5* 1.8  --  1.5* 1.7  PHOS 3.1  --   --   --   --   --   --     GFR: Estimated Creatinine Clearance: 91.2 mL/min (by C-G formula based on SCr of 0.67 mg/dL).  Liver Function Tests: Recent Labs  Lab 07/31/23 0809 07/31/23 1734 08/01/23 0537 08/04/23 0509  AST  --  26 20 17   ALT  --  25 22 13   ALKPHOS  --  76 50 50  BILITOT  --  0.5 0.7 0.4  PROT  --  8.1 6.5 6.6  ALBUMIN 3.6 3.7 2.7* 2.7*    CBG: Recent Labs  Lab 08/05/23 1601 08/05/23 2115 08/06/23 0618 08/06/23 1114 08/06/23 1604  GLUCAP 267* 170* 208* 226* 236*     Recent Results (from the past 240 hours)  Resp panel by RT-PCR (RSV, Flu A&B, Covid) Anterior Nasal Swab     Status: None   Collection Time: 07/31/23  5:36 PM   Specimen: Anterior Nasal Swab  Result Value Ref Range Status   SARS Coronavirus 2 by RT PCR NEGATIVE NEGATIVE Final    Comment: (NOTE) SARS-CoV-2 target nucleic acids are NOT DETECTED.  The SARS-CoV-2 RNA is generally detectable in upper respiratory specimens during the acute phase of infection. The lowest concentration of SARS-CoV-2 viral copies this assay can detect is 138 copies/mL. A negative result does not preclude SARS-Cov-2 infection and should not be used as the sole basis for treatment or other patient management decisions. A negative result may occur with  improper specimen collection/handling, submission of specimen other than nasopharyngeal swab, presence of viral mutation(s) within the areas targeted by this assay, and inadequate number of viral copies(<138 copies/mL). A negative result must be combined with clinical observations, patient history, and epidemiological information. The expected result is Negative.  Fact Sheet for Patients:   BloggerCourse.com  Fact Sheet for Healthcare Providers:  SeriousBroker.it  This test is no t yet approved or cleared by the [place]  FDA and  has been authorized for detection and/or diagnosis of SARS-CoV-2 by FDA under an Emergency Use Authorization (EUA). This EUA will remain  in effect (meaning this test can be used) for the duration of the COVID-19 declaration under Section 564(b)(1) of the Act, 21 U.S.C.section 360bbb-3(b)(1), unless the authorization is terminated  or revoked sooner.       Influenza A by PCR NEGATIVE NEGATIVE Final   Influenza B by PCR NEGATIVE NEGATIVE Final    Comment: (NOTE) The Xpert Xpress SARS-CoV-2/FLU/RSV plus assay is intended as an aid in the diagnosis of influenza from Nasopharyngeal swab specimens and should not be used as a sole basis for treatment. Nasal washings and aspirates are unacceptable for Xpert Xpress SARS-CoV-2/FLU/RSV testing.  Fact Sheet for Patients: BloggerCourse.com  Fact Sheet for Healthcare Providers: SeriousBroker.it  This test is not yet approved or cleared by the [place]  FDA and has been authorized for detection and/or diagnosis of SARS-CoV-2 by FDA under an Emergency Use Authorization (EUA). This EUA will remain in effect (meaning this test can be used) for the duration of the COVID-19 declaration under Section 564(b)(1) of the Act, 21 U.S.C. section 360bbb-3(b)(1), unless the authorization is terminated or revoked.     Resp Syncytial Virus by PCR NEGATIVE NEGATIVE Final    Comment: (NOTE) Fact Sheet for Patients: BloggerCourse.com  Fact Sheet for Healthcare Providers: SeriousBroker.it  This test is not yet approved or cleared by the [place]  FDA and has been authorized for detection and/or diagnosis of SARS-CoV-2 by FDA under an Emergency Use  Authorization (EUA). This EUA will remain in effect (meaning this test can be used) for the duration of the COVID-19 declaration under Section 564(b)(1) of the Act, 21 U.S.C. section 360bbb-3(b)(1), unless the authorization is terminated or revoked.  Performed at [facility], [address] [place], [place] [address]   Blood Culture (routine x 2)     Status: Abnormal   Collection Time: 07/31/23  6:48 PM   Specimen: BLOOD  Result Value Ref Range Status   Specimen Description   Final    BLOOD LEFT ANTECUBITAL Performed at [facility], [address] [place], [place] [address]    Special Requests   Final    BOTTLES DRAWN AEROBIC AND ANAEROBIC Blood Culture adequate volume Performed at [facility], [address] [place], [place] [address]    Culture  Setup Time   Final    GRAM POSITIVE COCCI IN BOTH AEROBIC AND ANAEROBIC BOTTLES CRITICAL VALUE NOTED.  VALUE IS CONSISTENT WITH PREVIOUSLY REPORTED AND CALLED VALUE.    Culture (A)  Final    GROUP B STREP(S.AGALACTIAE)ISOLATED SUSCEPTIBILITIES PERFORMED ON PREVIOUS CULTURE WITHIN THE LAST 5 DAYS. Performed at [facility], [address] [place], [place] [address]    Report Status 08/03/2023 FINAL  Final  Blood Culture (routine x 2)     Status: Abnormal   Collection Time: 07/31/23  7:01 PM   Specimen: BLOOD  Result Value Ref Range Status   Specimen Description   Final    BLOOD RIGHT ANTECUBITAL Performed at [facility], [address] [place], [place] [address]    Special Requests   Final    BOTTLES DRAWN AEROBIC AND ANAEROBIC Blood Culture adequate volume Performed at [facility], [address] [place], [place] [address]  Culture  Setup Time   Final    GRAM POSITIVE COCCI IN BOTH AEROBIC AND ANAEROBIC BOTTLES CRITICAL RESULT CALLED TO, READ BACK BY AND VERIFIED WITH: PHARMD MCICHELLE BELL [phone] AT 0816 BY EC Performed at [facility],  [address] [place], [place] [address]    Culture GROUP B STREP(S.AGALACTIAE)ISOLATED (A)  Final   Report Status 08/03/2023 FINAL  Final   Organism ID, Bacteria GROUP B STREP(S.AGALACTIAE)ISOLATED  Final      Susceptibility   Group b strep(s.agalactiae)isolated - MIC*    CLINDAMYCIN <=0.25 SENSITIVE Sensitive     AMPICILLIN <=0.25 SENSITIVE Sensitive     ERYTHROMYCIN <=0.12 SENSITIVE Sensitive     VANCOMYCIN  0.5 SENSITIVE Sensitive     CEFTRIAXONE  <=0.12 SENSITIVE Sensitive     LEVOFLOXACIN 1 SENSITIVE Sensitive     PENICILLIN <=0.06 SENSITIVE Sensitive     * GROUP B STREP(S.AGALACTIAE)ISOLATED  Blood Culture ID Panel (Reflexed)     Status: Abnormal   Collection Time: 07/31/23  7:01 PM  Result Value Ref Range Status   Enterococcus faecalis NOT DETECTED NOT DETECTED Final   Enterococcus Faecium NOT DETECTED NOT DETECTED Final   Listeria monocytogenes NOT DETECTED NOT DETECTED Final   Staphylococcus species NOT DETECTED NOT DETECTED Final   Staphylococcus aureus (BCID) NOT DETECTED NOT DETECTED Final   Staphylococcus epidermidis NOT DETECTED NOT DETECTED Final   Staphylococcus lugdunensis NOT DETECTED NOT DETECTED Final   Streptococcus species DETECTED (A) NOT DETECTED Final    Comment: CRITICAL RESULT CALLED TO, READ BACK BY AND VERIFIED WITH: PHARMD MICHELLE BELL [phone] AT 0816 BY EC    Streptococcus agalactiae DETECTED (A) NOT DETECTED Final    Comment: CRITICAL RESULT CALLED TO, READ BACK BY AND VERIFIED WITH: PHARMD MICHELLE BELL [phone] AT 0816 BY EC    Streptococcus pneumoniae NOT DETECTED NOT DETECTED Final   Streptococcus pyogenes NOT DETECTED NOT DETECTED Final   A.calcoaceticus-baumannii NOT DETECTED NOT DETECTED Final   Bacteroides fragilis NOT DETECTED NOT DETECTED Final   Enterobacterales NOT DETECTED NOT DETECTED Final   Enterobacter cloacae complex NOT DETECTED NOT DETECTED Final   Escherichia coli NOT DETECTED NOT DETECTED Final   Klebsiella aerogenes NOT  DETECTED NOT DETECTED Final   Klebsiella oxytoca NOT DETECTED NOT DETECTED Final   Klebsiella pneumoniae NOT DETECTED NOT DETECTED Final   Proteus species NOT DETECTED NOT DETECTED Final   Salmonella species NOT DETECTED NOT DETECTED Final   Serratia marcescens NOT DETECTED NOT DETECTED Final   Haemophilus influenzae NOT DETECTED NOT DETECTED Final   Neisseria meningitidis NOT DETECTED NOT DETECTED Final   Pseudomonas aeruginosa NOT DETECTED NOT DETECTED Final   Stenotrophomonas maltophilia NOT DETECTED NOT DETECTED Final   Candida albicans NOT DETECTED NOT DETECTED Final   Candida auris NOT DETECTED NOT DETECTED Final   Candida glabrata NOT DETECTED NOT DETECTED Final   Candida krusei NOT DETECTED NOT DETECTED Final   Candida parapsilosis NOT DETECTED NOT DETECTED Final   Candida tropicalis NOT DETECTED NOT DETECTED Final   Cryptococcus neoformans/gattii NOT DETECTED NOT DETECTED Final    Comment: Performed at [facility], [address] [place], [place] [address]  Culture, [place] Urine     Status: None   Collection Time: 08/01/23 12:27 AM   Specimen: Urine, Random  Result Value Ref Range Status   Specimen Description   Final    URINE, RANDOM Performed at [facility], [address] [place], [place] [address]    Special Requests  Final    NONE Performed at [facility], [address], [place], [place] [address]    Culture   Final    NO GROWTH NO GROUP B STREP (S.AGALACTIAE) ISOLATED Performed at [facility], [address], [place], [place] [address]    Report Status 08/02/2023 FINAL  Final  Culture, blood (Routine X 2) w Reflex to ID Panel     Status: None (Preliminary result)   Collection Time: 08/02/23 11:55 AM   Specimen: BLOOD RIGHT ARM  Result Value Ref Range Status   Specimen Description   Final    BLOOD RIGHT ARM Performed at [place] [facility], [address], [place], [place] [address]    Special Requests    Final    BOTTLES DRAWN AEROBIC AND ANAEROBIC Blood Culture results may not be optimal due to an inadequate volume of blood received in culture bottles Performed at [facility], [address], [place], [place] [address]    Culture   Final    NO GROWTH 4 DAYS Performed at [facility], [address], [place], [place] [address]    Report Status PENDING  Incomplete  Culture, blood (Routine X 2) w Reflex to ID Panel     Status: None (Preliminary result)   Collection Time: 08/02/23 12:01 PM   Specimen: BLOOD RIGHT HAND  Result Value Ref Range Status   Specimen Description   Final    BLOOD RIGHT HAND Performed at [facility], [address] [place], [place] [address]    Special Requests   Final    BOTTLES DRAWN AEROBIC ONLY Blood Culture results may not be optimal due to an inadequate volume of blood received in culture bottles Performed at [facility], [address] [place], [place] [address]    Culture   Final    NO GROWTH 4 DAYS Performed at [facility], [address], [place], [place] [address]    Report Status PENDING  Incomplete         Radiology Studies: No results found.       Scheduled Meds:  anastrozole   1 mg Oral Daily   DULoxetine   30 mg Oral Daily   feeding supplement (GLUCERNA SHAKE)  237 mL Oral TID BM   insulin  aspart  0-20 Units Subcutaneous TID WC   insulin  aspart  0-5 Units Subcutaneous QHS   insulin  aspart  5 Units Subcutaneous TID WC   insulin  glargine-yfgn  23 Units Subcutaneous Q2200   magnesium  oxide  400 mg Oral BID   metoprolol  succinate  50 mg Oral Daily   pravastatin   20 mg Oral Daily   Continuous Infusions:  lactated ringers      And   lactated ringers      magnesium  sulfate bolus IVPB     penicillin G potassium 12 Million Units in dextrose  5 % 500 mL CONTINUOUS infusion 12 Million Units (08/06/23 1714)     LOS: 4 days    Time spent: 40 minutes    Hilda Lovings,  MD [facility]   To contact the attending provider between 7A-7P or the covering provider during after hours 7P-7A, please log into the web site [email or web address] and access using universal  password for that web site. If you do not have the password, please call the hospital operator.  08/06/2023, 9:03 PM

## 2023-08-07 ENCOUNTER — Other Ambulatory Visit: Payer: Self-pay

## 2023-08-07 ENCOUNTER — Encounter (HOSPITAL_COMMUNITY): Payer: Self-pay | Admitting: Family Medicine

## 2023-08-07 ENCOUNTER — Inpatient Hospital Stay (HOSPITAL_COMMUNITY): Payer: Self-pay | Admitting: Anesthesiology

## 2023-08-07 ENCOUNTER — Encounter (HOSPITAL_COMMUNITY)
Admission: EM | Disposition: A | Discharge status: Rehabilitation Facility | Payer: Self-pay | Source: Other Acute Inpatient Hospital | Attending: Thoracic Surgery (Cardiothoracic Vascular Surgery)

## 2023-08-07 DIAGNOSIS — I10 Essential (primary) hypertension: Secondary | ICD-10-CM | POA: Diagnosis not present

## 2023-08-07 DIAGNOSIS — R7881 Bacteremia: Secondary | ICD-10-CM | POA: Diagnosis not present

## 2023-08-07 DIAGNOSIS — I059 Rheumatic mitral valve disease, unspecified: Secondary | ICD-10-CM | POA: Diagnosis not present

## 2023-08-07 DIAGNOSIS — B951 Streptococcus, group B, as the cause of diseases classified elsewhere: Secondary | ICD-10-CM | POA: Diagnosis not present

## 2023-08-07 DIAGNOSIS — F411 Generalized anxiety disorder: Secondary | ICD-10-CM | POA: Diagnosis not present

## 2023-08-07 DIAGNOSIS — A419 Sepsis, unspecified organism: Secondary | ICD-10-CM | POA: Diagnosis not present

## 2023-08-07 HISTORY — PX: TOOTH EXTRACTION: SHX859

## 2023-08-07 LAB — CBC
HCT: 33.3 % — ABNORMAL LOW (ref 36.0–46.0)
Hemoglobin: 10.9 g/dL — ABNORMAL LOW (ref 12.0–15.0)
MCH: 28.1 pg (ref 26.0–34.0)
MCHC: 32.7 g/dL (ref 30.0–36.0)
MCV: 85.8 fL (ref 80.0–100.0)
Platelets: 218 10*3/uL (ref 150–400)
RBC: 3.88 MIL/uL (ref 3.87–5.11)
RDW: 13.5 % (ref 11.5–15.5)
WBC: 5.7 10*3/uL (ref 4.0–10.5)
nRBC: 0 % (ref 0.0–0.2)

## 2023-08-07 LAB — GLUCOSE, CAPILLARY
Glucose-Capillary: 193 mg/dL — ABNORMAL HIGH (ref 70–99)
Glucose-Capillary: 179 mg/dL — ABNORMAL HIGH (ref 70–99)
Glucose-Capillary: 167 mg/dL — ABNORMAL HIGH (ref 70–99)
Glucose-Capillary: 151 mg/dL — ABNORMAL HIGH (ref 70–99)
Glucose-Capillary: 175 mg/dL — ABNORMAL HIGH (ref 70–99)
Glucose-Capillary: 152 mg/dL — ABNORMAL HIGH (ref 70–99)
Glucose-Capillary: 164 mg/dL — ABNORMAL HIGH (ref 70–99)

## 2023-08-07 LAB — CULTURE, BLOOD (ROUTINE X 2)
Culture: NO GROWTH
Culture: NO GROWTH

## 2023-08-07 LAB — BASIC METABOLIC PANEL WITH GFR
Anion gap: 7 (ref 5–15)
BUN: 7 mg/dL — ABNORMAL LOW (ref 8–23)
CO2: 26 mmol/L (ref 22–32)
Calcium: 8.9 mg/dL (ref 8.9–10.3)
Chloride: 102 mmol/L (ref 98–111)
Creatinine, Ser: 0.71 mg/dL (ref 0.44–1.00)
GFR, Estimated: >60 mL/min (ref >60)
Glucose, Bld: 178 mg/dL — ABNORMAL HIGH (ref 70–99)
Potassium: 4.2 mmol/L (ref 3.5–5.1)
Sodium: 135 mmol/L (ref 135–145)

## 2023-08-07 LAB — LIPID PANEL
Cholesterol: 95 mg/dL (ref 0–200)
HDL: 27 mg/dL — ABNORMAL LOW (ref >40)
LDL Cholesterol: 58 mg/dL (ref 0–99)
Total CHOL/HDL Ratio: 3.5 ratio
Triglycerides: 52 mg/dL (ref <150)
VLDL: 10 mg/dL (ref 0–40)

## 2023-08-07 LAB — MAGNESIUM: Magnesium: 2.1 mg/dL (ref 1.7–2.4)

## 2023-08-07 SURGERY — DENTAL RESTORATION/EXTRACTIONS
Anesthesia: General | Site: Mouth

## 2023-08-07 MED ORDER — ASPIRIN 81 MG PO CHEW
81.0000 mg | CHEWABLE_TABLET | ORAL | Status: AC
  Administered 2023-08-08: 81 mg via ORAL
  Filled 2023-08-07: qty 1

## 2023-08-07 MED ORDER — FENTANYL CITRATE (PF) 250 MCG/5ML IJ SOLN
INTRAMUSCULAR | Status: AC
  Filled 2023-08-07: qty 5

## 2023-08-07 MED ORDER — CHLORHEXIDINE GLUCONATE 0.12 % MT SOLN: 15.0000 mL | Freq: Once | OROMUCOSAL | Status: AC

## 2023-08-07 MED ORDER — OXYCODONE HCL 5 MG/5ML PO SOLN
5.0000 mg | Freq: Once | ORAL | Status: DC | PRN
Start: 2023-08-07 — End: 2023-08-07

## 2023-08-07 MED ORDER — SODIUM CHLORIDE 0.9 % WEIGHT BASED INFUSION
1.0000 mL/kg/h | INTRAVENOUS | Status: DC
  Administered 2023-08-08 (×2): 1 mL/kg/h via INTRAVENOUS

## 2023-08-07 MED ORDER — SUGAMMADEX SODIUM 200 MG/2ML IV SOLN
INTRAVENOUS | Status: DC | PRN
  Administered 2023-08-07 (×2): 200 mg via INTRAVENOUS

## 2023-08-07 MED ORDER — ONDANSETRON HCL 4 MG/2ML IJ SOLN
INTRAMUSCULAR | Status: DC | PRN
Start: 2023-08-07 — End: 2023-08-07
  Administered 2023-08-07: 4 mg via INTRAVENOUS

## 2023-08-07 MED ORDER — MIDAZOLAM HCL 2 MG/2ML IJ SOLN
INTRAMUSCULAR | Status: DC | PRN
Start: 2023-08-07 — End: 2023-08-07
  Administered 2023-08-07: 1 mg via INTRAVENOUS

## 2023-08-07 MED ORDER — DROPERIDOL 2.5 MG/ML IJ SOLN: 0.6250 mg | Freq: Once | INTRAMUSCULAR | Status: DC | PRN

## 2023-08-07 MED ORDER — CHLORHEXIDINE GLUCONATE 0.12 % MT SOLN
OROMUCOSAL | Status: AC
Start: 2023-08-07 — End: 2023-08-07
  Administered 2023-08-07: 15 mL via OROMUCOSAL
  Filled 2023-08-07: qty 15

## 2023-08-07 MED ORDER — PROPOFOL 10 MG/ML IV BOLUS
INTRAVENOUS | Status: DC | PRN
  Administered 2023-08-07: 130 mg via INTRAVENOUS
  Administered 2023-08-07: 10 mg via INTRAVENOUS

## 2023-08-07 MED ORDER — FENTANYL CITRATE (PF) 100 MCG/2ML IJ SOLN: 25.0000 ug | INTRAMUSCULAR | Status: DC | PRN

## 2023-08-07 MED ORDER — ORAL CARE MOUTH RINSE: 15.0000 mL | Freq: Once | OROMUCOSAL | Status: AC

## 2023-08-07 MED ORDER — LIDOCAINE-EPINEPHRINE 2 %-1:100000 IJ SOLN
INTRAMUSCULAR | Status: DC | PRN
  Administered 2023-08-07: 15 mL

## 2023-08-07 MED ORDER — SODIUM CHLORIDE 0.9 % WEIGHT BASED INFUSION
3.0000 mL/kg/h | INTRAVENOUS | Status: AC
Start: 2023-08-08 — End: 2023-08-08
  Administered 2023-08-07: 3 mL/kg/h via INTRAVENOUS

## 2023-08-07 MED ORDER — PHENYLEPHRINE HCL-NACL 20-0.9 MG/250ML-% IV SOLN
INTRAVENOUS | Status: DC | PRN
  Administered 2023-08-07: 15 ug/min via INTRAVENOUS

## 2023-08-07 MED ORDER — ACETAMINOPHEN 10 MG/ML IV SOLN: 1000.0000 mg | Freq: Once | INTRAVENOUS | Status: DC | PRN

## 2023-08-07 MED ORDER — OXYMETAZOLINE HCL 0.05 % NA SOLN
NASAL | Status: AC
Start: 2023-08-07 — End: 2023-08-08
  Filled 2023-08-07: qty 30

## 2023-08-07 MED ORDER — LIDOCAINE 2% (20 MG/ML) 5 ML SYRINGE
INTRAMUSCULAR | Status: DC | PRN
  Administered 2023-08-07: 100 mg via INTRAVENOUS

## 2023-08-07 MED ORDER — ALBUMIN HUMAN 25 % IV SOLN
25.0000 g | Freq: Once | INTRAVENOUS | Status: AC
  Administered 2023-08-07: 25 g via INTRAVENOUS
  Filled 2023-08-07: qty 100

## 2023-08-07 MED ORDER — ROCURONIUM BROMIDE 10 MG/ML (PF) SYRINGE
PREFILLED_SYRINGE | INTRAVENOUS | Status: DC | PRN
  Administered 2023-08-07: 60 mg via INTRAVENOUS

## 2023-08-07 MED ORDER — MIDAZOLAM HCL 2 MG/2ML IJ SOLN
INTRAMUSCULAR | Status: AC
  Filled 2023-08-07: qty 2

## 2023-08-07 MED ORDER — PHENYLEPHRINE 80 MCG/ML (10ML) SYRINGE FOR IV PUSH (FOR BLOOD PRESSURE SUPPORT)
PREFILLED_SYRINGE | INTRAVENOUS | Status: DC | PRN
  Administered 2023-08-07: 80 ug via INTRAVENOUS

## 2023-08-07 MED ORDER — HYDROCODONE-ACETAMINOPHEN 5-325 MG PO TABS: 1.0000 | ORAL_TABLET | ORAL | Status: DC | PRN

## 2023-08-07 MED ORDER — 0.9 % SODIUM CHLORIDE (POUR BTL) OPTIME
TOPICAL | Status: DC | PRN
  Administered 2023-08-07: 1000 mL

## 2023-08-07 MED ORDER — LIDOCAINE-EPINEPHRINE 2 %-1:100000 IJ SOLN
INTRAMUSCULAR | Status: AC
  Filled 2023-08-07: qty 1

## 2023-08-07 MED ORDER — ALBUTEROL SULFATE HFA 108 (90 BASE) MCG/ACT IN AERS
INHALATION_SPRAY | RESPIRATORY_TRACT | Status: DC | PRN
  Administered 2023-08-07: 6 via RESPIRATORY_TRACT

## 2023-08-07 MED ORDER — LACTATED RINGERS IV SOLN: INTRAVENOUS | Status: DC

## 2023-08-07 MED ORDER — OXYCODONE HCL 5 MG PO TABS: 5.0000 mg | ORAL_TABLET | Freq: Once | ORAL | Status: DC | PRN

## 2023-08-07 MED ORDER — FENTANYL CITRATE (PF) 250 MCG/5ML IJ SOLN
INTRAMUSCULAR | Status: DC | PRN
  Administered 2023-08-07: 100 ug via INTRAVENOUS
  Administered 2023-08-07: 50 ug via INTRAVENOUS

## 2023-08-07 SURGICAL SUPPLY — 26 items
BAG COUNTER SPONGE SURGICOUNT (BAG) IMPLANT
BLADE SURG 15 STRL LF DISP TIS (BLADE) ×1 IMPLANT
BUR CROSS CUT FISSURE 1.6 (BURR) ×1 IMPLANT
BUR EGG ELITE 4.0 (BURR) IMPLANT
CANISTER SUCTION 3000ML PPV (SUCTIONS) ×1 IMPLANT
COVER SURGICAL LIGHT HANDLE (MISCELLANEOUS) ×1 IMPLANT
GAUZE PACKING FOLDED 2 STR (GAUZE/BANDAGES/DRESSINGS) ×1 IMPLANT
GLOVE BIO SURGEON STRL SZ8 (GLOVE) ×1 IMPLANT
GOWN STRL REUS W/ TWL LRG LVL3 (GOWN DISPOSABLE) ×1 IMPLANT
GOWN STRL REUS W/ TWL XL LVL3 (GOWN DISPOSABLE) ×1 IMPLANT
IV NS 1000ML BAXH (IV SOLUTION) ×1 IMPLANT
KIT BASIN OR (CUSTOM PROCEDURE TRAY) ×1 IMPLANT
KIT TURNOVER KIT B (KITS) ×1 IMPLANT
NDL HYPO 25GX1X1/2 BEV (NEEDLE) ×2 IMPLANT
NEEDLE HYPO 25GX1X1/2 BEV (NEEDLE) ×1 IMPLANT
NS IRRIG 1000ML POUR BTL (IV SOLUTION) ×1 IMPLANT
PAD ARMBOARD POSITIONER FOAM (MISCELLANEOUS) ×1 IMPLANT
SLEEVE IRRIGATION ELITE 7 (MISCELLANEOUS) ×1 IMPLANT
SPIKE FLUID TRANSFER (MISCELLANEOUS) IMPLANT
SUT CHROMIC 3 0 SH 27 (SUTURE) IMPLANT
SUT PLAIN 3 0 PS2 27 (SUTURE) IMPLANT
SYR BULB IRRIG 60ML STRL (SYRINGE) ×1 IMPLANT
SYR CONTROL 10ML LL (SYRINGE) ×1 IMPLANT
TRAY ENT MC OR (CUSTOM PROCEDURE TRAY) ×1 IMPLANT
TUBING IRRIGATION (MISCELLANEOUS) ×1 IMPLANT
YANKAUER SUCT BULB TIP NO VENT (SUCTIONS) ×1 IMPLANT

## 2023-08-07 NOTE — Anesthesia Postprocedure Evaluation (Signed)
 Anesthesia Post Note  Patient: Holiday representative  Procedure(s) Performed: EXTRACTION TEETH NUMBERS ELEVEN, THIRTEEN, AND FOURTEEN (Mouth)     Patient location during evaluation: PACU Anesthesia Type: General Level of consciousness: awake and alert Pain management: pain level controlled Vital Signs Assessment: post-procedure vital signs reviewed and stable Respiratory status: spontaneous breathing, nonlabored ventilation, respiratory function stable and patient connected to nasal cannula oxygen Cardiovascular status: blood pressure returned to baseline and stable Postop Assessment: no apparent nausea or vomiting Anesthetic complications: no  No notable events documented.  Last Vitals:  Vitals:   08/07/23 1626 08/07/23 1638  BP:  108/69  Pulse: 99 100  Resp: (!) 25 18  Temp: 36.8 C   SpO2: 94% 92%    Last Pain:  Vitals:   08/07/23 1615  TempSrc:   PainSc: 0-No pain                 Willian Harrow

## 2023-08-07 NOTE — Progress Notes (Signed)
 No need to start periop glycemic control protocol per Dr. Otis Blocker. Will continue to monitor.

## 2023-08-07 NOTE — H&P (Signed)
 H&P documentation  -History and Physical Reviewed  -Patient has been re-examined  -No change in the plan of care  Melissa Bray

## 2023-08-07 NOTE — Op Note (Signed)
 [name], Melissa Bray MEDICAL RECORD NO: [id number] ACCOUNT NO: [id number] DATE OF BIRTH: June 14, 1960 FACILITY: [facility] LOCATION: [facility] PHYSICIAN: Cornelia Dieter, DDS  Operative Report   DATE OF PROCEDURE: 08/07/2023  PREOPERATIVE DIAGNOSIS:  Nonrestorable teeth secondary to dental caries, numbers 11, 14 and 30.  POSTOPERATIVE DIAGNOSIS:  Nonrestorable teeth secondary to dental caries, numbers 11, 14 and 30.  PROCEDURE:  Extraction of teeth numbers 11, 14, and 30.  SURGEON:  Cornelia Dieter, DDS  ANESTHESIA:  General.  Oral intubation.  Dr.  Jarrell Merritts, attending.  DESCRIPTION OF PROCEDURE:  The patient was taken to the operating room and placed on the table in supine position.  General anesthesia was administered.  An oral endotracheal tube was placed and secured.  The eyes were protected.  The tube was secured  and the patient was draped for surgery.  A timeout was performed.  The posterior pharynx was suctioned and a throat pack was placed.  2% lidocaine with 1:100,000 epinephrine was infiltrated buccally and palatally around teeth numbers 11 and 14, and an  inferior block was administered in the right mandible as well as buccal and lingual infiltration around tooth #30.  A bite block was placed on the right side of the mouth.  A periosteal elevator was used to elevate the periosteum around teeth numbers 11  and 14.  The 301 elevator was used to elevate the root fragments that remained, and then the roots were removed with the rongeurs.  The sockets were curetted and irrigated.  No suturing was necessary.  The bite block was repositioned to the other side of  the mouth.  Tooth #30 had two roots that were visible.  These were removed using the periosteal elevator, 301 elevator, and rongeurs.  The socket was curetted and debrided, and then a 3-0 chromic gut suture was placed.  The oral cavity was irrigated and  suctioned.  The throat pack was removed.  The patient was left under the care of  anesthesia for transport to recovery with plans for discharge to the floor.  ESTIMATED BLOOD LOSS:  Minimum.  COMPLICATIONS:  None.  SPECIMENS:  None.  COUNTS:  Correct.   PUS D: 08/07/2023 3:40:34 pm T: 08/07/2023 3:56:00 pm  JOB: [id number]/ [id number]

## 2023-08-07 NOTE — Progress Notes (Signed)
 PROGRESS NOTE    Melissa Bray  ZOX:[id number] DOB: 09/16/1960 DOA: 07/31/2023 PCP: Kandace Organ, NP   Chief Complaint  Patient presents with   Fatigue    Brief Narrative:  Patient is a 63 years old female with past medical history of diabetes mellitus type 2, hypertension, hyperlipidemia, breast cancer status post left mastectomy presented to the hospital with hypotension and fever with left arm redness swelling.     Significant events: 5/1: Admitted with hypotension, fever for arm cellulitis 5/2: BCx positive for group B streptococcus in 2/2 5/3: Echo ordered due to murmur 5/4: TTE shows MR, ID and Cardiology recommend TEE 5/5: TEE shows mitral vegetation; CT surgery, ID and Cardiology consulted   Assessment & Plan:   Principal Problem:   Mitral valve endocarditis Active Problems:   Perforation of leaflet of mitral valve   Essential hypertension   DM2 (diabetes mellitus, type 2) (HCC)   HLD (hyperlipidemia)   Severe sepsis (HCC)   Cellulitis of left upper extremity   Obesity, Class III, BMI 40-49.9 (morbid obesity)   GAD (generalized anxiety disorder)   History of breast cancer   Hypomagnesemia   Hypokalemia   Hyponatremia   Sinus tachycardia   #1 mitral valve endocarditis with severe MR/perforation of anterior valve leaflet/severe sepsis secondary to group B strep bacteremia/left upper extremity cellulitis - 2D echo done showed MVR, suspected perforation. - TEE done consistent with a mobile vegetation on mitral valve and severe MR and perforated anterior mitral valve leaflet. Blood cultures from 07/31/2023 grew group B strep agalactiae. - Repeat blood cultures from 08/02/2023 with no growth to date. - Orthopantogram with left maxillary periapical abscess noted. - Patient seen by CT surgery, urgent dental consultation placed and patient for dental extraction tomorrow 08/07/2023. - Was on IV cefazolin  which has been discontinued per ID recommendations and patient  currently on IV penicillin G. - Patient seen by CT surgery who are recommending mitral surgery and also recommending cardiac catheterization prior to surgical procedure. -Patient for dental extraction this morning. - Cardiology, ID, CT surgery following.  2.  Sinus tachycardia -Likely secondary to problem #1. - Improved. - Continue Toprol -XL.  3.  History of breast cancer -Continue anastrozole .  4.  Generalized anxiety disorder - Continue duloxetine .  5.  Hyperlipidemia - Statin.  6.  Diabetes mellitus type 2, uncontrolled -Hemoglobin A1c 12.8.(07/31/2023) - CBG 193 this morning. - Continue Semglee  to 23 units daily. - Patient currently n.p.o. today in anticipation of dental extraction.  -Continue to hold meal coverage NovoLog  for now.   - SSI.   7.  Hypomagnesemia -Magnesium  at 2.1 today.  - Continue oral magnesium . - Repeat labs in the morning.  8.  Hypertension -BP noted to be soft. - Currently on Toprol -XL. - Continue to hold home regimen amlodipine , clonidine . -IV albumin x 1 as BP soft this morning. - Follow.  9.  Hypokalemia -Repleted.  10.  Hyponatremia - Improved with hydration.  11.  Obesity class III -BMI 43.22 kg/m. -Lifestyle modification. - Outpatient follow-up with PCP.    DVT prophylaxis: SCDs Code Status: Full Family Communication: Updated patient.  No family at bedside. Disposition: TBD  Status is: Inpatient Remains inpatient appropriate because: Severity of illness   Consultants:  Cardiology: Dr. Lavonne Prairie 08/04/2023 Cardiothoracic surgery: Dr. Luna Salinas 08/04/2023 Infectious disease: Dr. Zelda Hickman 08/05/2023 Oral surgeon: Dr. Juleen Oakland 08/05/2023  Procedures:  Chest x-ray 07/31/2023 Orthopantogram 08/04/2023 2D echo 08/03/2023 TEE 08/04/2023 Left upper extremity venous Dopplers 08/01/2023  Antimicrobials:  Anti-infectives (From admission,  onward)    Start     Dose/Rate Route Frequency Ordered Stop   08/04/23 1700  penicillin G potassium 12  Million Units in dextrose  5 % 500 mL CONTINUOUS infusion        12 Million Units 41.7 mL/hr over 12 Hours Intravenous Every 12 hours 08/04/23 1550     08/01/23 2200  vancomycin  (VANCOREADY) IVPB 1250 mg/250 mL  Status:  Discontinued        1,250 mg 166.7 mL/hr over 90 Minutes Intravenous Every 24 hours 08/01/23 0037 08/01/23 0833   08/01/23 1200  ceFAZolin  (ANCEF ) IVPB 2g/100 mL premix  Status:  Discontinued        2 g 200 mL/hr over 30 Minutes Intravenous Every 8 hours 08/01/23 1009 08/04/23 1550   08/01/23 0930  ceFAZolin  (ANCEF ) IVPB 2g/100 mL premix  Status:  Discontinued        2 g 200 mL/hr over 30 Minutes Intravenous Every 8 hours 08/01/23 0833 08/01/23 1009   08/01/23 0200  cefTRIAXone  (ROCEPHIN ) 1 g in sodium chloride  0.9 % 100 mL IVPB  Status:  Discontinued        1 g 200 mL/hr over 30 Minutes Intravenous Every 24 hours 08/01/23 0018 08/01/23 0833   07/31/23 2015  vancomycin  (VANCOCIN ) IVPB 1000 mg/200 mL premix       Placed in "Followed by" Linked Group   1,000 mg 200 mL/hr over 60 Minutes Intravenous  Once 07/31/23 1902 07/31/23 2241   07/31/23 1915  ceFEPIme  (MAXIPIME ) 2 g in sodium chloride  0.9 % 100 mL IVPB        2 g 200 mL/hr over 30 Minutes Intravenous  Once 07/31/23 1900 07/31/23 2006   07/31/23 1915  metroNIDAZOLE  (FLAGYL ) IVPB 500 mg        500 mg 100 mL/hr over 60 Minutes Intravenous  Once 07/31/23 1900 08/01/23 0038   07/31/23 1915  vancomycin  (VANCOCIN ) IVPB 1000 mg/200 mL premix  Status:  Discontinued        1,000 mg 200 mL/hr over 60 Minutes Intravenous  Once 07/31/23 1900 07/31/23 1902   07/31/23 1915  vancomycin  (VANCOCIN ) IVPB 1000 mg/200 mL premix       Placed in "Followed by" Linked Group   1,000 mg 200 mL/hr over 60 Minutes Intravenous  Once 07/31/23 1902 07/31/23 2127         Subjective: Patient sitting up at the side of the bed washing up.  Denies any chest pain, no shortness of breath, no abdominal pain.  Denies any oral pain.  Awaiting to go  to the OR this morning for dental extraction.   Objective: Vitals:   08/06/23 1603 08/06/23 1955 08/06/23 2342 08/07/23 0311  BP: 108/70 100/64 99/67 91/66   Pulse: 88 88 90 94  Resp: 18 20 20 20   Temp: 98.5 F (36.9 C) 98.5 F (36.9 C) 98.8 F (37.1 C) 98.5 F (36.9 C)  TempSrc: Oral Oral Oral Oral  SpO2:  97% 93% 93%  Weight:    118.2 kg  Height:        Intake/Output Summary (Last 24 hours) at 08/07/2023 0815 Last data filed at 08/06/2023 2354 Gross per 24 hour  Intake 50 ml  Output --  Net 50 ml   Filed Weights   08/05/23 0425 08/06/23 0340 08/07/23 0311  Weight: 117.6 kg 117.8 kg 118.2 kg    Examination:  General exam: NAD. Respiratory system: Lungs clear to auscultation bilaterally.  No wheezes, no crackles, no rhonchi.  Fair air movement.  Speaking in full sentences.  Cardiovascular system: RRR with 3/6 SEM at LLSB.  No AVD, no murmurs rubs or gallops.  No lower extremity edema. Gastrointestinal system: Abdomen is soft, nontender, nondistended, positive bowel sounds.  No rebound.  No guarding.  Central nervous system: Alert and oriented. No focal neurological deficits. Extremities: Left upper extremity tightness, swelling, decreased erythema.  Symmetric 5 x 5 power. Skin: No rashes, lesions or ulcers Psychiatry: Judgement and insight appear normal. Mood & affect appropriate.     Data Reviewed: I have personally reviewed following labs and imaging studies  CBC: Recent Labs  Lab 07/31/23 1734 08/01/23 0537 08/02/23 0819 08/04/23 0509 08/06/23 1046 08/07/23 0421  WBC 9.7 7.9 5.9 4.8 6.6 5.7  NEUTROABS 7.8* 5.7  --   --   --   --   HGB 13.8 11.7* 12.0 12.1 11.7* 10.9*  HCT 39.5 35.3* 36.7 36.9 35.0* 33.3*  MCV 82.8 86.1 85.5 85.4 84.1 85.8  PLT 166 139* 152 198 235 218    Basic Metabolic Panel: Recent Labs  Lab 08/01/23 0537 08/02/23 0819 08/04/23 0509 08/05/23 0419 08/06/23 1046 08/07/23 0421  NA 132* 134* 130*  --  134* 135  K 3.2* 3.8 3.1*  --   4.2 4.2  CL 99 99 93*  --  100 102  CO2 24 24 25   --  25 26  GLUCOSE 236* 211* 374*  --  236* 178*  BUN 11 11 9   --  8 7*  CREATININE 0.79 0.73 0.94  --  0.67 0.71  CALCIUM 8.6* 8.6* 8.4*  --  9.2 8.9  MG 1.5* 1.8  --  1.5* 1.7 2.1    GFR: Estimated Creatinine Clearance: 91.4 mL/min (by C-G formula based on SCr of 0.71 mg/dL).  Liver Function Tests: Recent Labs  Lab 07/31/23 1734 08/01/23 0537 08/04/23 0509  AST 26 20 17   ALT 25 22 13   ALKPHOS 76 50 50  BILITOT 0.5 0.7 0.4  PROT 8.1 6.5 6.6  ALBUMIN 3.7 2.7* 2.7*    CBG: Recent Labs  Lab 08/06/23 0618 08/06/23 1114 08/06/23 1604 08/06/23 2115 08/07/23 0623  GLUCAP 208* 226* 236* 126* 193*     Recent Results (from the past 240 hours)  Resp panel by RT-PCR (RSV, Flu A&B, Covid) Anterior Nasal Swab     Status: None   Collection Time: 07/31/23  5:36 PM   Specimen: Anterior Nasal Swab  Result Value Ref Range Status   SARS Coronavirus 2 by RT PCR NEGATIVE NEGATIVE Final    Comment: (NOTE) SARS-CoV-2 target nucleic acids are NOT DETECTED.  The SARS-CoV-2 RNA is generally detectable in upper respiratory specimens during the acute phase of infection. The lowest concentration of SARS-CoV-2 viral copies this assay can detect is 138 copies/mL. A negative result does not preclude SARS-Cov-2 infection and should not be used as the sole basis for treatment or other patient management decisions. A negative result may occur with  improper specimen collection/handling, submission of specimen other than nasopharyngeal swab, presence of viral mutation(s) within the areas targeted by this assay, and inadequate number of viral copies(<138 copies/mL). A negative result must be combined with clinical observations, patient history, and epidemiological information. The expected result is Negative.  Fact Sheet for Patients:  BloggerCourse.com  Fact Sheet for Healthcare Providers:   SeriousBroker.it  This test is no t yet approved or cleared by the [place]  FDA and  has been authorized for detection and/or diagnosis of SARS-CoV-2 by FDA under an Emergency  Use Authorization (EUA). This EUA will remain  in effect (meaning this test can be used) for the duration of the COVID-19 declaration under Section 564(b)(1) of the Act, 21 U.S.C.section 360bbb-3(b)(1), unless the authorization is terminated  or revoked sooner.       Influenza A by PCR NEGATIVE NEGATIVE Final   Influenza B by PCR NEGATIVE NEGATIVE Final    Comment: (NOTE) The Xpert Xpress SARS-CoV-2/FLU/RSV plus assay is intended as an aid in the diagnosis of influenza from Nasopharyngeal swab specimens and should not be used as a sole basis for treatment. Nasal washings and aspirates are unacceptable for Xpert Xpress SARS-CoV-2/FLU/RSV testing.  Fact Sheet for Patients: BloggerCourse.com  Fact Sheet for Healthcare Providers: SeriousBroker.it  This test is not yet approved or cleared by the [place]  FDA and has been authorized for detection and/or diagnosis of SARS-CoV-2 by FDA under an Emergency Use Authorization (EUA). This EUA will remain in effect (meaning this test can be used) for the duration of the COVID-19 declaration under Section 564(b)(1) of the Act, 21 U.S.C. section 360bbb-3(b)(1), unless the authorization is terminated or revoked.     Resp Syncytial Virus by PCR NEGATIVE NEGATIVE Final    Comment: (NOTE) Fact Sheet for Patients: BloggerCourse.com  Fact Sheet for Healthcare Providers: SeriousBroker.it  This test is not yet approved or cleared by the [place]  FDA and has been authorized for detection and/or diagnosis of SARS-CoV-2 by FDA under an Emergency Use Authorization (EUA). This EUA will remain in effect (meaning this test can be used) for  the duration of the COVID-19 declaration under Section 564(b)(1) of the Act, 21 U.S.C. section 360bbb-3(b)(1), unless the authorization is terminated or revoked.  Performed at [facility], [address] [place], [place] [address]   Blood Culture (routine x 2)     Status: Abnormal   Collection Time: 07/31/23  6:48 PM   Specimen: BLOOD  Result Value Ref Range Status   Specimen Description   Final    BLOOD LEFT ANTECUBITAL Performed at [facility], [address] [place], [place] [address]    Special Requests   Final    BOTTLES DRAWN AEROBIC AND ANAEROBIC Blood Culture adequate volume Performed at [facility], [address] [place], [place] [address]    Culture  Setup Time   Final    GRAM POSITIVE COCCI IN BOTH AEROBIC AND ANAEROBIC BOTTLES CRITICAL VALUE NOTED.  VALUE IS CONSISTENT WITH PREVIOUSLY REPORTED AND CALLED VALUE.    Culture (A)  Final    GROUP B STREP(S.AGALACTIAE)ISOLATED SUSCEPTIBILITIES PERFORMED ON PREVIOUS CULTURE WITHIN THE LAST 5 DAYS. Performed at [facility], [address], [place], [place] [address]    Report Status 08/03/2023 FINAL  Final  Blood Culture (routine x 2)     Status: Abnormal   Collection Time: 07/31/23  7:01 PM   Specimen: BLOOD  Result Value Ref Range Status   Specimen Description   Final    BLOOD RIGHT ANTECUBITAL Performed at [facility], [address] [place], [place] [address]    Special Requests   Final    BOTTLES DRAWN AEROBIC AND ANAEROBIC Blood Culture adequate volume Performed at [facility], [address] [place], [place] [address]    Culture  Setup Time   Final    GRAM POSITIVE COCCI IN BOTH AEROBIC AND ANAEROBIC BOTTLES CRITICAL RESULT CALLED TO, READ BACK BY AND VERIFIED WITH: PHARMD  MCICHELLE BELL [phone] AT 0816 BY EC Performed at [facility], [address], [place], [place] [address]    Culture GROUP B STREP(S.AGALACTIAE)ISOLATED (A)   Final   Report Status 08/03/2023 FINAL  Final   Organism ID, Bacteria GROUP B STREP(S.AGALACTIAE)ISOLATED  Final      Susceptibility   Group b strep(s.agalactiae)isolated - MIC*    CLINDAMYCIN <=0.25 SENSITIVE Sensitive     AMPICILLIN <=0.25 SENSITIVE Sensitive     ERYTHROMYCIN <=0.12 SENSITIVE Sensitive     VANCOMYCIN  0.5 SENSITIVE Sensitive     CEFTRIAXONE  <=0.12 SENSITIVE Sensitive     LEVOFLOXACIN 1 SENSITIVE Sensitive     PENICILLIN <=0.06 SENSITIVE Sensitive     * GROUP B STREP(S.AGALACTIAE)ISOLATED  Blood Culture ID Panel (Reflexed)     Status: Abnormal   Collection Time: 07/31/23  7:01 PM  Result Value Ref Range Status   Enterococcus faecalis NOT DETECTED NOT DETECTED Final   Enterococcus Faecium NOT DETECTED NOT DETECTED Final   Listeria monocytogenes NOT DETECTED NOT DETECTED Final   Staphylococcus species NOT DETECTED NOT DETECTED Final   Staphylococcus aureus (BCID) NOT DETECTED NOT DETECTED Final   Staphylococcus epidermidis NOT DETECTED NOT DETECTED Final   Staphylococcus lugdunensis NOT DETECTED NOT DETECTED Final   Streptococcus species DETECTED (A) NOT DETECTED Final    Comment: CRITICAL RESULT CALLED TO, READ BACK BY AND VERIFIED WITH: PHARMD MICHELLE BELL [id number] AT 0816 BY EC    Streptococcus agalactiae DETECTED (A) NOT DETECTED Final    Comment: CRITICAL RESULT CALLED TO, READ BACK BY AND VERIFIED WITH: PHARMD MICHELLE BELL [id number] AT 0816 BY EC    Streptococcus pneumoniae NOT DETECTED NOT DETECTED Final   Streptococcus pyogenes NOT DETECTED NOT DETECTED Final   A.calcoaceticus-baumannii NOT DETECTED NOT DETECTED Final   Bacteroides fragilis NOT DETECTED NOT DETECTED Final   Enterobacterales NOT DETECTED NOT DETECTED Final   Enterobacter cloacae complex NOT DETECTED NOT DETECTED Final   Escherichia coli NOT DETECTED NOT DETECTED Final   Klebsiella aerogenes NOT DETECTED NOT DETECTED Final   Klebsiella oxytoca NOT DETECTED NOT DETECTED Final   Klebsiella  pneumoniae NOT DETECTED NOT DETECTED Final   Proteus species NOT DETECTED NOT DETECTED Final   Salmonella species NOT DETECTED NOT DETECTED Final   Serratia marcescens NOT DETECTED NOT DETECTED Final   Haemophilus influenzae NOT DETECTED NOT DETECTED Final   Neisseria meningitidis NOT DETECTED NOT DETECTED Final   Pseudomonas aeruginosa NOT DETECTED NOT DETECTED Final   Stenotrophomonas maltophilia NOT DETECTED NOT DETECTED Final   Candida albicans NOT DETECTED NOT DETECTED Final   Candida auris NOT DETECTED NOT DETECTED Final   Candida glabrata NOT DETECTED NOT DETECTED Final   Candida krusei NOT DETECTED NOT DETECTED Final   Candida parapsilosis NOT DETECTED NOT DETECTED Final   Candida tropicalis NOT DETECTED NOT DETECTED Final   Cryptococcus neoformans/gattii NOT DETECTED NOT DETECTED Final    Comment: Performed at [facility], [address] [place], [place] [address]  Culture, [place] Urine     Status: None   Collection Time: 08/01/23 12:27 AM   Specimen: Urine, Random  Result Value Ref Range Status   Specimen Description   Final    URINE, RANDOM Performed at [facility], [address], [place], [place] [address]    Special Requests   Final    NONE Performed at [facility], [address], [place], [place] [address]    Culture   Final  NO GROWTH NO GROUP B STREP (S.AGALACTIAE) ISOLATED Performed at [facility], [address] [place], [place] [address]    Report Status 08/02/2023 FINAL  Final  Culture, blood (Routine X 2) w Reflex to ID Panel     Status: None (Preliminary result)   Collection Time: 08/02/23 11:55 AM   Specimen: BLOOD RIGHT ARM  Result Value Ref Range Status   Specimen Description   Final    BLOOD RIGHT ARM Performed at [facility], [address], [place], [place] [address]    Special Requests   Final    BOTTLES DRAWN AEROBIC AND ANAEROBIC Blood Culture results may not be optimal due to an  inadequate volume of blood received in culture bottles Performed at [facility], [address], [place], [place] [address]    Culture   Final    NO GROWTH 4 DAYS Performed at [facility], [address], [place], [place] [address]    Report Status PENDING  Incomplete  Culture, blood (Routine X 2) w Reflex to ID Panel     Status: None (Preliminary result)   Collection Time: 08/02/23 12:01 PM   Specimen: BLOOD RIGHT HAND  Result Value Ref Range Status   Specimen Description   Final    BLOOD RIGHT HAND Performed at [facility], [address], [place], [place] [address]    Special Requests   Final    BOTTLES DRAWN AEROBIC ONLY Blood Culture results may not be optimal due to an inadequate volume of blood received in culture bottles Performed at [facility], [address], [place], [place] [address]    Culture   Final    NO GROWTH 4 DAYS Performed at [facility], [address] [place], [place] [address]    Report Status PENDING  Incomplete         Radiology Studies: No results found.       Scheduled Meds:  anastrozole   1 mg Oral Daily   DULoxetine   30 mg Oral Daily   feeding supplement (GLUCERNA SHAKE)  237 mL Oral TID BM   insulin  aspart  0-20 Units Subcutaneous TID WC   insulin  aspart  0-5 Units Subcutaneous QHS   insulin  aspart  5 Units Subcutaneous TID WC   insulin  glargine-yfgn  23 Units Subcutaneous Q2200   magnesium  oxide  400 mg Oral BID   metoprolol  succinate  50 mg Oral Daily   pravastatin   20 mg Oral Daily   Continuous Infusions:  albumin human     lactated ringers      And   lactated ringers      penicillin G potassium 12 Million Units in dextrose  5 % 500 mL CONTINUOUS infusion 12 Million Units (08/07/23 0557)     LOS: 5 days    Time spent: 40 minutes    Hilda Lovings, MD [facility]   To contact the attending provider between 7A-7P or the covering provider during after  hours 7P-7A, please log into the web site [email or web address] and access using universal [place] password for that web site. If you do not have the password, please call the hospital operator.  08/07/2023, 8:15 AM

## 2023-08-07 NOTE — Progress Notes (Signed)
Patient back from procedure.

## 2023-08-07 NOTE — Op Note (Signed)
 08/07/2023  3:37 PM  PATIENT:  Melissa Bray  63 y.o. female  PRE-OPERATIVE DIAGNOSIS:  nonrestorable TEETH NUMBERS ELEVEN, FOURTEEN AND THIRTY.  POST-OPERATIVE DIAGNOSIS:  SAME  PROCEDURE:  Procedure(s): EXTRACTION TEETH NUMBERS ELEVEN,  FOURTEEN, THIRTY  SURGEON:  Surgeon(s): Ascencion Lava, DMD  ANESTHESIA:   local and general  EBL:  minimal  DRAINS: none   SPECIMEN:  No Specimen  COUNTS:  YES  PLAN OF CARE: Discharge to FLOOR after PACU  PATIENT DISPOSITION:  PACU - hemodynamically stable.   PROCEDURE DETAILS: Dictation #[id number]  Melissa Bray, DMD 08/07/2023 3:37 PM

## 2023-08-07 NOTE — Progress Notes (Addendum)
 Patient Name: Melissa Bray Date of Encounter: 08/07/2023 [facility] Cardiologist: Eilleen Grates, MD   Interval Summary  .    Plan for dental extraction today.  Doing well with no complaints. Vital Signs .    Vitals:   08/06/23 1955 08/06/23 2342 08/07/23 0311 08/07/23 0855  BP: 100/64 99/67 91/66  100/85  Pulse: 88 90 94 95  Resp: 20 20 20 18   Temp: 98.5 F (36.9 C) 98.8 F (37.1 C) 98.5 F (36.9 C) 98.4 F (36.9 C)  TempSrc: Oral Oral Oral Oral  SpO2: 97% 93% 93% 96%  Weight:   118.2 kg   Height:        Intake/Output Summary (Last 24 hours) at 08/07/2023 0943 Last data filed at 08/06/2023 2354 Gross per 24 hour  Intake 50 ml  Output --  Net 50 ml      08/07/2023    3:11 AM 08/06/2023    3:40 AM 08/05/2023    4:25 AM  Last 3 Weights  Weight (lbs) 260 lb 9.3 oz 259 lb 11.2 oz 259 lb 4.2 oz  Weight (kg) 118.2 kg 117.8 kg 117.6 kg      Telemetry/ECG    Sinus rhythm heart rates 80s to 90s- Personally Reviewed  CV Studies    TEE 08/04/2023 1. Left ventricular ejection fraction, by estimation, is 65 to 70%. The  left ventricle has hyperdynamic function.   2. Right ventricular systolic function is normal. The right ventricular  size is normal.   3. No left atrial/left atrial appendage thrombus was detected. The LAA  emptying velocity was 65 cm/s.   4. Mobile vegetation noted on the anterior mitral leaflet with  perforation of the leaflet. There is thickening and probably vegetation of  the posterior leaflet as well. The mitral valve is abnormal. Severe mitral  valve regurgitation. No evidence of  mitral stenosis.   5. The aortic valve is tricuspid. Aortic valve regurgitation is not  visualized.   6. Agitated saline contrast bubble study was negative, with no evidence  of any interatrial shunt.   7. 3D performed of the mitral valve and demonstrates Endocarditis with  perforation of the anterior leaflet and severe eccentric MR.    Conclusion(s)/Recommendation(s): Findings are concerning for  vegetation/infective endocarditis as detailed above. Findings concerning  for mitral valve vegetation. Cardiology evaluation and eventual CT  surgical evaluation recommended. Would be best to  transfer to [facility] for further work-up.   Echocardiogram 08/03/2023  1. Left ventricular ejection fraction, by estimation, is 65 to 70%. The  left ventricle has normal function. The left ventricle has no regional  wall motion abnormalities. Left ventricular diastolic parameters are  indeterminate.   2. Right ventricular systolic function is normal. The right ventricular  size is normal. Tricuspid regurgitation signal is inadequate for assessing  PA pressure.   3. Moderate to probably severe mitral regurgitation. Regurgitant jet  originates from the proximal portion of the anterior mitral valve leaflet  as opposed to the distal coapation point suggesting likely perforated  anterior mitral valve leaflet. Recommend   TEE to better evaluate. . The mitral valve is abnormal. Moderate to  severe mitral valve regurgitation. No evidence of mitral stenosis.   4. The aortic valve is tricuspid. Aortic valve regurgitation is not  visualized. No aortic stenosis is present.     Physical Exam .   GEN: No acute distress.   Neck: No JVD Cardiac: 3 out of 6 murmur, apex Respiratory: Clear to auscultation bilaterally.  GI: Soft, nontender, non-distended  MS: No edema  Patient Profile    Melissa Bray is a 63 y.o. female has hx of breast cancer status post left mastectomy, hypertension, hyperlipidemia, type 2 diabetes.  Currently being evaluated for MV endocarditis.  Assessment & Plan .     MV endocarditis Severe MR with perforation of MV leaflet Presented with severe sepsis secondary to cellulitis of upper extremity with positive blood cultures for group B strep. TTE with severe MR/perforation..  Underwent TEE that shows mobile vegetation  on both leaflets of MV, severe eccentric MR. Plan now is for valve repair per CT surgery.  Will undergo dental extraction today.  Cardiac catheterization arranged for tomorrow 5/9 Echo with preserved LVEF 65 to 70%, euvolemic no complaints of shortness of breath or orthopnea. Antibiotics per infectious disease.  Sinus tachycardia Resolved  TSH normal Continue with Toprol -XL 50 mg.  Continue to manage electrolytes.  Hypertension Soft blood seems stable.   Continue to hold home amlodipine  and clonidine  and lisinopril .   Type 2 diabetes A1c 12.8%, uncontrolled.  Further management per primary team.  Hyperlipidemia On pravastatin  20 mg, LDL 58 this admission.  Informed Consent   Shared Decision Making/Informed Consent The risks [stroke (1 in 1000), death (1 in 1000), kidney failure [usually temporary] (1 in 500), bleeding (1 in 200), allergic reaction [possibly serious] (1 in 200)], benefits (diagnostic support and management of coronary artery disease) and alternatives of a cardiac catheterization were discussed in detail with Melissa Bray and she is willing to proceed.    For questions or updates, please contact [facility] Please consult [email or web address] for contact info under        Signed, Burnetta Cart, PA-C    History and all data above reviewed.   Endocarditis:  Teeth are to be extracted today and we will plan cardiac cath for tomorrow .   Burnetta Cart  9:43 AM  08/07/2023

## 2023-08-07 NOTE — H&P (View-Only) (Signed)
 Patient Name: Melissa Bray Date of Encounter: 08/07/2023 [facility] Cardiologist: Eilleen Grates, MD   Interval Summary  .    Plan for dental extraction today.  Doing well with no complaints. Vital Signs .    Vitals:   08/06/23 1955 08/06/23 2342 08/07/23 0311 08/07/23 0855  BP: 100/64 99/67 91/66  100/85  Pulse: 88 90 94 95  Resp: 20 20 20 18   Temp: 98.5 F (36.9 C) 98.8 F (37.1 C) 98.5 F (36.9 C) 98.4 F (36.9 C)  TempSrc: Oral Oral Oral Oral  SpO2: 97% 93% 93% 96%  Weight:   118.2 kg   Height:        Intake/Output Summary (Last 24 hours) at 08/07/2023 0943 Last data filed at 08/06/2023 2354 Gross per 24 hour  Intake 50 ml  Output --  Net 50 ml      08/07/2023    3:11 AM 08/06/2023    3:40 AM 08/05/2023    4:25 AM  Last 3 Weights  Weight (lbs) 260 lb 9.3 oz 259 lb 11.2 oz 259 lb 4.2 oz  Weight (kg) 118.2 kg 117.8 kg 117.6 kg      Telemetry/ECG    Sinus rhythm heart rates 80s to 90s- Personally Reviewed  CV Studies    TEE 08/04/2023 1. Left ventricular ejection fraction, by estimation, is 65 to 70%. The  left ventricle has hyperdynamic function.   2. Right ventricular systolic function is normal. The right ventricular  size is normal.   3. No left atrial/left atrial appendage thrombus was detected. The LAA  emptying velocity was 65 cm/s.   4. Mobile vegetation noted on the anterior mitral leaflet with  perforation of the leaflet. There is thickening and probably vegetation of  the posterior leaflet as well. The mitral valve is abnormal. Severe mitral  valve regurgitation. No evidence of  mitral stenosis.   5. The aortic valve is tricuspid. Aortic valve regurgitation is not  visualized.   6. Agitated saline contrast bubble study was negative, with no evidence  of any interatrial shunt.   7. 3D performed of the mitral valve and demonstrates Endocarditis with  perforation of the anterior leaflet and severe eccentric MR.    Conclusion(s)/Recommendation(s): Findings are concerning for  vegetation/infective endocarditis as detailed above. Findings concerning  for mitral valve vegetation. Cardiology evaluation and eventual CT  surgical evaluation recommended. Would be best to  transfer to [facility] for further work-up.   Echocardiogram 08/03/2023  1. Left ventricular ejection fraction, by estimation, is 65 to 70%. The  left ventricle has normal function. The left ventricle has no regional  wall motion abnormalities. Left ventricular diastolic parameters are  indeterminate.   2. Right ventricular systolic function is normal. The right ventricular  size is normal. Tricuspid regurgitation signal is inadequate for assessing  PA pressure.   3. Moderate to probably severe mitral regurgitation. Regurgitant jet  originates from the proximal portion of the anterior mitral valve leaflet  as opposed to the distal coapation point suggesting likely perforated  anterior mitral valve leaflet. Recommend   TEE to better evaluate. . The mitral valve is abnormal. Moderate to  severe mitral valve regurgitation. No evidence of mitral stenosis.   4. The aortic valve is tricuspid. Aortic valve regurgitation is not  visualized. No aortic stenosis is present.     Physical Exam .   GEN: No acute distress.   Neck: No JVD Cardiac: 3 out of 6 murmur, apex Respiratory: Clear to auscultation bilaterally.  GI: Soft, nontender, non-distended  MS: No edema  Patient Profile    Anmarie Diersen is a 63 y.o. female has hx of breast cancer status post left mastectomy, hypertension, hyperlipidemia, type 2 diabetes.  Currently being evaluated for MV endocarditis.  Assessment & Plan .     MV endocarditis Severe MR with perforation of MV leaflet Presented with severe sepsis secondary to cellulitis of upper extremity with positive blood cultures for group B strep. TTE with severe MR/perforation..  Underwent TEE that shows mobile vegetation  on both leaflets of MV, severe eccentric MR. Plan now is for valve repair per CT surgery.  Will undergo dental extraction today.  Cardiac catheterization arranged for tomorrow 5/9 Echo with preserved LVEF 65 to 70%, euvolemic no complaints of shortness of breath or orthopnea. Antibiotics per infectious disease.  Sinus tachycardia Resolved  TSH normal Continue with Toprol -XL 50 mg.  Continue to manage electrolytes.  Hypertension Soft blood seems stable.   Continue to hold home amlodipine  and clonidine  and lisinopril .   Type 2 diabetes A1c 12.8%, uncontrolled.  Further management per primary team.  Hyperlipidemia On pravastatin  20 mg, LDL 58 this admission.  Informed Consent   Shared Decision Making/Informed Consent The risks [stroke (1 in 1000), death (1 in 1000), kidney failure [usually temporary] (1 in 500), bleeding (1 in 200), allergic reaction [possibly serious] (1 in 200)], benefits (diagnostic support and management of coronary artery disease) and alternatives of a cardiac catheterization were discussed in detail with [name] and she is willing to proceed.    For questions or updates, please contact [facility] Please consult [email or web address] for contact info under        Signed, Burnetta Cart, PA-C    History and all data above reviewed.   Endocarditis:  Teeth are to be extracted today and we will plan cardiac cath for tomorrow .   Burnetta Cart  9:43 AM  08/07/2023

## 2023-08-07 NOTE — Anesthesia Preprocedure Evaluation (Addendum)
 Anesthesia Evaluation  Patient identified by MRN, date of birth, ID band Patient awake    Reviewed: Allergy & Precautions, NPO status , Patient's Chart, lab work & pertinent test results  Airway Mallampati: II  TM Distance: >3 FB Neck ROM: Full    Dental  (+) Teeth Intact, Dental Advisory Given   Pulmonary    breath sounds clear to auscultation       Cardiovascular hypertension,  Rhythm:Regular Rate:Normal  08/04/2022 Echo:   1. Left ventricular ejection fraction, by estimation, is 65 to 70%. The  left ventricle has hyperdynamic function.   2. Right ventricular systolic function is normal. The right ventricular  size is normal.   3. No left atrial/left atrial appendage thrombus was detected. The LAA  emptying velocity was 65 cm/s.   4. Mobile vegetation noted on the anterior mitral leaflet with  perforation of the leaflet. There is thickening and probably vegetation of  the posterior leaflet as well. The mitral valve is abnormal. Severe mitral  valve regurgitation. No evidence of  mitral stenosis.   5. The aortic valve is tricuspid. Aortic valve regurgitation is not  visualized.   6. Agitated saline contrast bubble study was negative, with no evidence  of any interatrial shunt.   7. 3D performed of the mitral valve and demonstrates Endocarditis with  perforation of the anterior leaflet and severe eccentric MR.     Neuro/Psych  PSYCHIATRIC DISORDERS Anxiety Depression       GI/Hepatic Neg liver ROS,GERD  ,,  Endo/Other  diabetes, Type 2, Oral Hypoglycemic Agents    Renal/GU Renal disease     Musculoskeletal negative musculoskeletal ROS (+)    Abdominal   Peds  Hematology negative hematology ROS (+)   Anesthesia Other Findings   Reproductive/Obstetrics                             Anesthesia Physical Anesthesia Plan  ASA: 3  Anesthesia Plan: General   Post-op Pain Management:  Tylenol  PO (pre-op)*   Induction: Intravenous  PONV Risk Score and Plan: 4 or greater and Ondansetron , Treatment may vary due to age or medical condition, Dexamethasone and Midazolam  Airway Management Planned: Nasal ETT  Additional Equipment: None  Intra-op Plan:   Post-operative Plan: Extubation in OR  Informed Consent: I have reviewed the patients History and Physical, chart, labs and discussed the procedure including the risks, benefits and alternatives for the proposed anesthesia with the patient or authorized representative who has indicated his/her understanding and acceptance.       Plan Discussed with: CRNA  Anesthesia Plan Comments:        Anesthesia Quick Evaluation

## 2023-08-07 NOTE — Anesthesia Procedure Notes (Signed)
 Procedure Name: Intubation Date/Time: 08/07/2023 3:23 PM  Performed by: Pasty Bongo, CRNAPre-anesthesia Checklist: Patient identified, Emergency Drugs available, Suction available and Patient being monitored Patient Re-evaluated:Patient Re-evaluated prior to induction Oxygen Delivery Method: Circle System Utilized Preoxygenation: Pre-oxygenation with 100% oxygen Induction Type: IV induction Ventilation: Mask ventilation without difficulty Laryngoscope Size: Mac and 3 Grade View: Grade I Tube type: Oral Tube size: 7.5 mm Number of attempts: 1 Airway Equipment and Method: Stylet and Oral airway Placement Confirmation: ETT inserted through vocal cords under direct vision, positive ETCO2 and breath sounds checked- equal and bilateral Secured at: 21 cm Tube secured with: Tape Dental Injury: Teeth and Oropharynx as per pre-operative assessment

## 2023-08-07 NOTE — Transfer of Care (Signed)
 Immediate Anesthesia Transfer of Care Note  Patient: Melissa Bray  Procedure(s) Performed: EXTRACTION TEETH NUMBERS ELEVEN, THIRTEEN, AND FOURTEEN (Mouth)  Patient Location: PACU  Anesthesia Type:General  Level of Consciousness: awake, oriented, and drowsy  Airway & Oxygen Therapy: Patient connected to nasal cannula oxygen  Post-op Assessment: Report given to RN, Post -op Vital signs reviewed and stable, and Patient moving all extremities  Post vital signs: Reviewed and stable  Last Vitals:  Vitals Value Taken Time  BP 109/69 08/07/23 1600  Temp 37.2 C 08/07/23 1553  Pulse 102 08/07/23 1601  Resp 25 08/07/23 1601  SpO2 92 % 08/07/23 1601  Vitals shown include unfiled device data.  Last Pain:  Vitals:   08/07/23 1553  TempSrc:   PainSc: 0-No pain         Complications: No notable events documented.

## 2023-08-07 NOTE — Progress Notes (Signed)
 [facility]  Date of Admission:  07/31/2023   Total days of inpatient antibiotics 7  Principal Problem:   Mitral valve endocarditis Active Problems:   Essential hypertension   DM2 (diabetes mellitus, type 2) (HCC)   HLD (hyperlipidemia)   Severe sepsis (HCC)   Cellulitis of left upper extremity   Obesity, Class III, BMI 40-49.9 (morbid obesity)   Perforation of leaflet of mitral valve   GAD (generalized anxiety disorder)   History of breast cancer   Hypomagnesemia   Hypokalemia   Hyponatremia   Sinus tachycardia          Assessment: 63 year old female with history of diabetes type 2, hypertension, breast cancer status post mastectomy in 2016 presented with left upper extremity swelling, fevers and chills found to have: #Group B strep bacteremia with native mitral valve endocarditis/perforation - Patient had been feeling unwell for about 5 to 6 days. - She had fever and leukocytosis admission - TTE showed severe MR, possible perforated anterior mitral leaflet. - TEE showed thickening with mobile vegetation on mitral valve, perforated anterior mitral leaflet and severe MR. - Attempted to see pt yesterday->not in room. Seen today -CTS consulted-> urgent dental consult. Of note pt had left maxillary periapical abscess(reports she does not recall seeing dentistry int he past).  #Diabetes mellitus, uncontrolled  -A1c 12.8 on 07/31/2023     Recommendations:  -Continue penicillin - Follow repeat blood cultures to ensure clearance - detal extraction today -Possible OR next week for MVV IE.. Will reset the clock on 6 weeks of abx form OR.   Evaluation of this patient requires complex antimicrobial therapy evaluation and counseling + isolation needs for disease transmission risk assessment and mitigation    Microbiology:   Antibiotics: Cefazolin  5/2-present Ceftriaxone  5/4 Vancomycin  metronidazole  5/12   Cultures: Blood 5/12/2 group B  strep 5/3-   SUBJECTIVE: Resting in bed Interval: afebrile overnight. Wbc 5.7k  Review of Systems: Review of Systems  All other systems reviewed and are negative.    Scheduled Meds:  [MAR Hold] anastrozole   1 mg Oral Daily   [MAR Hold] DULoxetine   30 mg Oral Daily   [MAR Hold] feeding supplement (GLUCERNA SHAKE)  237 mL Oral TID BM   [MAR Hold] insulin  aspart  0-20 Units Subcutaneous TID WC   [MAR Hold] insulin  aspart  0-5 Units Subcutaneous QHS   [MAR Hold] insulin  aspart  5 Units Subcutaneous TID WC   [MAR Hold] insulin  glargine-yfgn  23 Units Subcutaneous Q2200   [MAR Hold] magnesium  oxide  400 mg Oral BID   [MAR Hold] metoprolol  succinate  50 mg Oral Daily   oxymetazoline       [MAR Hold] pravastatin   20 mg Oral Daily   Continuous Infusions:  [MAR Hold] lactated ringers      And   [MAR Hold] lactated ringers      lactated ringers  10 mL/hr at 08/07/23 1456   [MAR Hold] penicillin G potassium 12 Million Units in dextrose  5 % 500 mL CONTINUOUS infusion 12 Million Units (08/07/23 0557)   PRN Meds:.[MAR Hold] acetaminophen  **OR** [MAR Hold] acetaminophen , [MAR Hold] melatonin, [MAR Hold] ondansetron  (ZOFRAN ) IV, oxymetazoline No Known Allergies  OBJECTIVE: Vitals:   08/07/23 0311 08/07/23 0855 08/07/23 1243 08/07/23 1345  BP: 91/66 100/85 112/79 103/67  Pulse: 94 95 88 87  Resp: 20 18  19   Temp: 98.5 F (36.9 C) 98.4 F (36.9 C) 98.3 F (36.8 C) 98.1 F (36.7 C)  TempSrc: Oral Oral Oral Oral  SpO2: 93% 96% 99% 97%  Weight: 118.2 kg   117.9 kg  Height:    5\' 5"  (1.651 m)   Body mass index is 43.27 kg/m.  Physical Exam Constitutional:      Appearance: Normal appearance.  HENT:     Head: Normocephalic and atraumatic.     Right Ear: Tympanic membrane normal.     Left Ear: Tympanic membrane normal.     Nose: Nose normal.     Mouth/Throat:     Mouth: Mucous membranes are moist.  Eyes:     Extraocular Movements: Extraocular movements intact.      Conjunctiva/sclera: Conjunctivae normal.     Pupils: Pupils are equal, round, and reactive to light.  Cardiovascular:     Rate and Rhythm: Normal rate and regular rhythm.     Heart sounds: No murmur heard.    No friction rub. No gallop.  Pulmonary:     Effort: Pulmonary effort is normal.     Breath sounds: Normal breath sounds.  Abdominal:     General: Abdomen is flat.     Palpations: Abdomen is soft.  Musculoskeletal:        General: Normal range of motion.  Skin:    General: Skin is warm and dry.  Neurological:     General: No focal deficit present.     Mental Status: She is alert and oriented to person, place, and time.  Psychiatric:        Mood and Affect: Mood normal.       Lab Results Lab Results  Component Value Date   WBC 5.7 08/07/2023   HGB 10.9 (L) 08/07/2023   HCT 33.3 (L) 08/07/2023   MCV 85.8 08/07/2023   PLT 218 08/07/2023    Lab Results  Component Value Date   CREATININE 0.71 08/07/2023   BUN 7 (L) 08/07/2023   NA 135 08/07/2023   K 4.2 08/07/2023   CL 102 08/07/2023   CO2 26 08/07/2023    Lab Results  Component Value Date   ALT 13 08/04/2023   AST 17 08/04/2023   ALKPHOS 50 08/04/2023   BILITOT 0.4 08/04/2023        Orlie Bjornstad, MD [facility] 08/07/2023, 3:00 PM

## 2023-08-08 ENCOUNTER — Ambulatory Visit (HOSPITAL_COMMUNITY): Admission: RE | Admit: 2023-08-08 | Source: Home / Self Care | Admitting: Cardiology

## 2023-08-08 ENCOUNTER — Inpatient Hospital Stay (HOSPITAL_COMMUNITY)

## 2023-08-08 ENCOUNTER — Inpatient Hospital Stay (HOSPITAL_COMMUNITY)
Admission: EM | Disposition: A | Discharge status: Rehabilitation Facility | Payer: Self-pay | Source: Other Acute Inpatient Hospital | Attending: Thoracic Surgery (Cardiothoracic Vascular Surgery)

## 2023-08-08 ENCOUNTER — Encounter (HOSPITAL_COMMUNITY): Payer: Self-pay | Admitting: Oral Surgery

## 2023-08-08 DIAGNOSIS — J9602 Acute respiratory failure with hypercapnia: Secondary | ICD-10-CM | POA: Diagnosis not present

## 2023-08-08 DIAGNOSIS — I340 Nonrheumatic mitral (valve) insufficiency: Secondary | ICD-10-CM

## 2023-08-08 DIAGNOSIS — J9601 Acute respiratory failure with hypoxia: Secondary | ICD-10-CM | POA: Diagnosis not present

## 2023-08-08 DIAGNOSIS — J810 Acute pulmonary edema: Secondary | ICD-10-CM | POA: Diagnosis not present

## 2023-08-08 DIAGNOSIS — R570 Cardiogenic shock: Secondary | ICD-10-CM

## 2023-08-08 DIAGNOSIS — R739 Hyperglycemia, unspecified: Secondary | ICD-10-CM

## 2023-08-08 DIAGNOSIS — R579 Shock, unspecified: Secondary | ICD-10-CM

## 2023-08-08 DIAGNOSIS — I5031 Acute diastolic (congestive) heart failure: Secondary | ICD-10-CM

## 2023-08-08 DIAGNOSIS — I059 Rheumatic mitral valve disease, unspecified: Secondary | ICD-10-CM | POA: Diagnosis not present

## 2023-08-08 DIAGNOSIS — I330 Acute and subacute infective endocarditis: Secondary | ICD-10-CM

## 2023-08-08 HISTORY — PX: IABP INSERTION: CATH118242

## 2023-08-08 HISTORY — DX: Cardiogenic shock: R57.0

## 2023-08-08 HISTORY — PX: RIGHT/LEFT HEART CATH AND CORONARY ANGIOGRAPHY: CATH118266

## 2023-08-08 HISTORY — DX: Shock, unspecified: R57.9

## 2023-08-08 LAB — POCT I-STAT 7, (LYTES, BLD GAS, ICA,H+H)
Acid-base deficit: 15 mmol/L — ABNORMAL HIGH (ref 0.0–2.0)
Acid-base deficit: 1 mmol/L (ref 0.0–2.0)
Acid-base deficit: 1 mmol/L (ref 0.0–2.0)
Bicarbonate: 20.7 mmol/L (ref 20.0–28.0)
Bicarbonate: 28.1 mmol/L — ABNORMAL HIGH (ref 20.0–28.0)
Bicarbonate: 24.5 mmol/L (ref 20.0–28.0)
Calcium, Ion: 1.38 mmol/L (ref 1.15–1.40)
Calcium, Ion: 1.18 mmol/L (ref 1.15–1.40)
Calcium, Ion: 1.13 mmol/L — ABNORMAL LOW (ref 1.15–1.40)
HCT: 50 % — ABNORMAL HIGH (ref 36.0–46.0)
HCT: 43 % (ref 36.0–46.0)
HCT: 40 % (ref 36.0–46.0)
Hemoglobin: 17 g/dL — ABNORMAL HIGH (ref 12.0–15.0)
Hemoglobin: 14.6 g/dL (ref 12.0–15.0)
Hemoglobin: 13.6 g/dL (ref 12.0–15.0)
O2 Saturation: 93 %
O2 Saturation: 59 %
O2 Saturation: 93 %
Patient temperature: 37.2
Potassium: 4.9 mmol/L (ref 3.5–5.1)
Potassium: 5 mmol/L (ref 3.5–5.1)
Potassium: 6.3 mmol/L (ref 3.5–5.1)
Sodium: 137 mmol/L (ref 135–145)
Sodium: 136 mmol/L (ref 135–145)
Sodium: 132 mmol/L — ABNORMAL LOW (ref 135–145)
TCO2: 24 mmol/L (ref 22–32)
TCO2: 30 mmol/L (ref 22–32)
TCO2: 26 mmol/L (ref 22–32)
pCO2 arterial: 105.8 mmHg (ref 32–48)
pCO2 arterial: 64.4 mmHg — ABNORMAL HIGH (ref 32–48)
pCO2 arterial: 42.8 mmHg (ref 32–48)
pH, Arterial: 6.899 — CL (ref 7.35–7.45)
pH, Arterial: 7.248 — ABNORMAL LOW (ref 7.35–7.45)
pH, Arterial: 7.368 (ref 7.35–7.45)
pO2, Arterial: 117 mmHg — ABNORMAL HIGH (ref 83–108)
pO2, Arterial: 37 mmHg — CL (ref 83–108)
pO2, Arterial: 69 mmHg — ABNORMAL LOW (ref 83–108)

## 2023-08-08 LAB — POCT I-STAT EG7
Acid-base deficit: 1 mmol/L (ref 0.0–2.0)
Acid-base deficit: 3 mmol/L — ABNORMAL HIGH (ref 0.0–2.0)
Bicarbonate: 24.3 mmol/L (ref 20.0–28.0)
Bicarbonate: 27.7 mmol/L (ref 20.0–28.0)
Calcium, Ion: 1.17 mmol/L (ref 1.15–1.40)
Calcium, Ion: 1.2 mmol/L (ref 1.15–1.40)
HCT: 42 % (ref 36.0–46.0)
HCT: 42 % (ref 36.0–46.0)
Hemoglobin: 14.3 g/dL (ref 12.0–15.0)
Hemoglobin: 14.3 g/dL (ref 12.0–15.0)
O2 Saturation: 57 %
O2 Saturation: 97 %
Potassium: 5 mmol/L (ref 3.5–5.1)
Potassium: 5.1 mmol/L (ref 3.5–5.1)
Sodium: 135 mmol/L (ref 135–145)
Sodium: 135 mmol/L (ref 135–145)
TCO2: 26 mmol/L (ref 22–32)
TCO2: 30 mmol/L (ref 22–32)
pCO2, Ven: 53.4 mmHg (ref 44–60)
pCO2, Ven: 63.7 mmHg — ABNORMAL HIGH (ref 44–60)
pH, Ven: 7.247 — ABNORMAL LOW (ref 7.25–7.43)
pH, Ven: 7.266 (ref 7.25–7.43)
pO2, Ven: 100 mmHg — ABNORMAL HIGH (ref 32–45)
pO2, Ven: 36 mmHg (ref 32–45)

## 2023-08-08 LAB — BASIC METABOLIC PANEL WITH GFR
Anion gap: 9 (ref 5–15)
Anion gap: 13 (ref 5–15)
Anion gap: 17 — ABNORMAL HIGH (ref 5–15)
Anion gap: 11 (ref 5–15)
BUN: 7 mg/dL — ABNORMAL LOW (ref 8–23)
BUN: 11 mg/dL (ref 8–23)
BUN: 12 mg/dL (ref 8–23)
BUN: 13 mg/dL (ref 8–23)
CO2: 23 mmol/L (ref 22–32)
CO2: 23 mmol/L (ref 22–32)
CO2: 20 mmol/L — ABNORMAL LOW (ref 22–32)
CO2: 25 mmol/L (ref 22–32)
Calcium: 8.9 mg/dL (ref 8.9–10.3)
Calcium: 8.4 mg/dL — ABNORMAL LOW (ref 8.9–10.3)
Calcium: 8.3 mg/dL — ABNORMAL LOW (ref 8.9–10.3)
Calcium: 9.2 mg/dL (ref 8.9–10.3)
Chloride: 101 mmol/L (ref 98–111)
Chloride: 97 mmol/L — ABNORMAL LOW (ref 98–111)
Chloride: 97 mmol/L — ABNORMAL LOW (ref 98–111)
Chloride: 101 mmol/L (ref 98–111)
Creatinine, Ser: 0.61 mg/dL (ref 0.44–1.00)
Creatinine, Ser: 1.13 mg/dL — ABNORMAL HIGH (ref 0.44–1.00)
Creatinine, Ser: 1.17 mg/dL — ABNORMAL HIGH (ref 0.44–1.00)
Creatinine, Ser: 1.15 mg/dL — ABNORMAL HIGH (ref 0.44–1.00)
GFR, Estimated: >60 mL/min (ref >60)
GFR, Estimated: 54 mL/min — ABNORMAL LOW (ref >60)
GFR, Estimated: 52 mL/min — ABNORMAL LOW (ref >60)
GFR, Estimated: 53 mL/min — ABNORMAL LOW (ref >60)
Glucose, Bld: 205 mg/dL — ABNORMAL HIGH (ref 70–99)
Glucose, Bld: 468 mg/dL — ABNORMAL HIGH (ref 70–99)
Glucose, Bld: 456 mg/dL — ABNORMAL HIGH (ref 70–99)
Glucose, Bld: 183 mg/dL — ABNORMAL HIGH (ref 70–99)
Potassium: 4.4 mmol/L (ref 3.5–5.1)
Potassium: 6.8 mmol/L (ref 3.5–5.1)
Potassium: 6.4 mmol/L (ref 3.5–5.1)
Potassium: 3.9 mmol/L (ref 3.5–5.1)
Sodium: 133 mmol/L — ABNORMAL LOW (ref 135–145)
Sodium: 133 mmol/L — ABNORMAL LOW (ref 135–145)
Sodium: 134 mmol/L — ABNORMAL LOW (ref 135–145)
Sodium: 137 mmol/L (ref 135–145)

## 2023-08-08 LAB — GLUCOSE, CAPILLARY
Glucose-Capillary: 174 mg/dL — ABNORMAL HIGH (ref 70–99)
Glucose-Capillary: 180 mg/dL — ABNORMAL HIGH (ref 70–99)
Glucose-Capillary: 180 mg/dL — ABNORMAL HIGH (ref 70–99)
Glucose-Capillary: 185 mg/dL — ABNORMAL HIGH (ref 70–99)
Glucose-Capillary: 200 mg/dL — ABNORMAL HIGH (ref 70–99)
Glucose-Capillary: 228 mg/dL — ABNORMAL HIGH (ref 70–99)
Glucose-Capillary: 263 mg/dL — ABNORMAL HIGH (ref 70–99)
Glucose-Capillary: 267 mg/dL — ABNORMAL HIGH (ref 70–99)
Glucose-Capillary: 349 mg/dL — ABNORMAL HIGH (ref 70–99)
Glucose-Capillary: 406 mg/dL — ABNORMAL HIGH (ref 70–99)
Glucose-Capillary: 426 mg/dL — ABNORMAL HIGH (ref 70–99)
Glucose-Capillary: 428 mg/dL — ABNORMAL HIGH (ref 70–99)
Glucose-Capillary: 432 mg/dL — ABNORMAL HIGH (ref 70–99)
Glucose-Capillary: 433 mg/dL — ABNORMAL HIGH (ref 70–99)

## 2023-08-08 LAB — CG4 I-STAT (LACTIC ACID): Lactic Acid, Venous: 2.1 mmol/L (ref 0.5–1.9)

## 2023-08-08 LAB — CBC
HCT: 36.1 % (ref 36.0–46.0)
Hemoglobin: 11.7 g/dL — ABNORMAL LOW (ref 12.0–15.0)
MCH: 28.1 pg (ref 26.0–34.0)
MCHC: 32.4 g/dL (ref 30.0–36.0)
MCV: 86.8 fL (ref 80.0–100.0)
Platelets: 241 10*3/uL (ref 150–400)
RBC: 4.16 MIL/uL (ref 3.87–5.11)
RDW: 13.5 % (ref 11.5–15.5)
WBC: 6 10*3/uL (ref 4.0–10.5)
nRBC: 0 % (ref 0.0–0.2)

## 2023-08-08 LAB — COOXEMETRY PANEL
Carboxyhemoglobin: 1.1 % (ref 0.5–1.5)
Methemoglobin: <0.7 % (ref 0.0–1.5)
O2 Saturation: 59.7 %
Total hemoglobin: 13.5 g/dL (ref 12.0–16.0)

## 2023-08-08 LAB — MRSA NEXT GEN BY PCR, NASAL: MRSA by PCR Next Gen: NOT DETECTED

## 2023-08-08 LAB — LACTIC ACID, PLASMA
Lactic Acid, Venous: 2.5 mmol/L (ref 0.5–1.9)
Lactic Acid, Venous: 2.6 mmol/L (ref 0.5–1.9)

## 2023-08-08 LAB — HEPARIN LEVEL (UNFRACTIONATED): Heparin Unfractionated: 0.1 [IU]/mL — ABNORMAL LOW (ref 0.30–0.70)

## 2023-08-08 SURGERY — RIGHT/LEFT HEART CATH AND CORONARY ANGIOGRAPHY
Anesthesia: LOCAL

## 2023-08-08 MED ORDER — SODIUM CHLORIDE 0.9 % IV SOLN: INTRAVENOUS | Status: AC

## 2023-08-08 MED ORDER — ORAL CARE MOUTH RINSE
15.0000 mL | OROMUCOSAL | Status: DC
  Administered 2023-08-08 – 2023-08-11 (×38): 15 mL via OROMUCOSAL

## 2023-08-08 MED ORDER — NOREPINEPHRINE 4 MG/250ML-% IV SOLN
0.0000 ug/min | INTRAVENOUS | Status: DC
  Filled 2023-08-08: qty 250

## 2023-08-08 MED ORDER — HEPARIN SODIUM (PORCINE) 1000 UNIT/ML IJ SOLN
INTRAMUSCULAR | Status: AC
  Filled 2023-08-08: qty 10

## 2023-08-08 MED ORDER — HYDRALAZINE HCL 20 MG/ML IJ SOLN
INTRAMUSCULAR | Status: AC
  Filled 2023-08-08: qty 1

## 2023-08-08 MED ORDER — FUROSEMIDE 10 MG/ML IJ SOLN
INTRAMUSCULAR | Status: DC | PRN
Start: 2023-08-08 — End: 2023-08-08
  Administered 2023-08-08: 80 mg via INTRAVENOUS

## 2023-08-08 MED ORDER — NOREPINEPHRINE 16 MG/250ML-% IV SOLN
0.0000 ug/min | INTRAVENOUS | Status: DC
  Administered 2023-08-08: 2 ug/min via INTRAVENOUS
  Administered 2023-08-09: 8 ug/min via INTRAVENOUS
  Administered 2023-08-10: 11 ug/min via INTRAVENOUS
  Filled 2023-08-08 (×6): qty 250

## 2023-08-08 MED ORDER — ETOMIDATE 2 MG/ML IV SOLN
INTRAVENOUS | Status: AC
Start: 2023-08-08 — End: 2023-08-08
  Filled 2023-08-08: qty 20

## 2023-08-08 MED ORDER — FENTANYL CITRATE PF 50 MCG/ML IJ SOSY
PREFILLED_SYRINGE | INTRAMUSCULAR | Status: AC
  Filled 2023-08-08: qty 2

## 2023-08-08 MED ORDER — NOREPINEPHRINE BITARTRATE 1 MG/ML IV SOLN
INTRAVENOUS | Status: AC | PRN
  Administered 2023-08-08: 5 ug/min via INTRAVENOUS

## 2023-08-08 MED ORDER — KETAMINE HCL 50 MG/5ML IJ SOSY
PREFILLED_SYRINGE | INTRAMUSCULAR | Status: AC
  Filled 2023-08-08: qty 10

## 2023-08-08 MED ORDER — HYDRALAZINE HCL 20 MG/ML IJ SOLN
INTRAMUSCULAR | Status: DC | PRN
  Administered 2023-08-08: 10 mg via INTRAVENOUS
  Administered 2023-08-08: 5 mg via INTRAVENOUS

## 2023-08-08 MED ORDER — TRANEXAMIC ACID (OHS) BOLUS VIA INFUSION
15.0000 mg/kg | INTRAVENOUS | Status: DC
  Filled 2023-08-08: qty 1769

## 2023-08-08 MED ORDER — ANASTROZOLE 1 MG PO TABS
1.0000 mg | ORAL_TABLET | Freq: Every day | ORAL | Status: DC
  Administered 2023-08-09 – 2023-08-11 (×3): 1 mg
  Filled 2023-08-08 (×3): qty 1

## 2023-08-08 MED ORDER — VANCOMYCIN HCL 1.5 G IV SOLR
1500.0000 mg | INTRAVENOUS | Status: DC
  Filled 2023-08-08 (×2): qty 30

## 2023-08-08 MED ORDER — VERAPAMIL HCL 2.5 MG/ML IV SOLN
INTRAVENOUS | Status: AC
Start: 2023-08-08 — End: ?
  Filled 2023-08-08: qty 2

## 2023-08-08 MED ORDER — POLYETHYLENE GLYCOL 3350 17 G PO PACK
17.0000 g | PACK | Freq: Every day | ORAL | Status: DC
  Administered 2023-08-09 – 2023-08-11 (×3): 17 g
  Filled 2023-08-08 (×4): qty 1

## 2023-08-08 MED ORDER — EPINEPHRINE HCL 5 MG/250ML IV SOLN IN NS
0.0000 ug/min | INTRAVENOUS | Status: DC
  Filled 2023-08-08: qty 250

## 2023-08-08 MED ORDER — INSULIN REGULAR(HUMAN) IN NACL 100-0.9 UT/100ML-% IV SOLN
INTRAVENOUS | Status: DC
  Administered 2023-08-08: 15 [IU]/h via INTRAVENOUS
  Administered 2023-08-08: 7.5 [IU]/h via INTRAVENOUS
  Filled 2023-08-08 (×2): qty 100

## 2023-08-08 MED ORDER — SODIUM CHLORIDE 0.9% IV SOLUTION: INTRAVENOUS | Status: DC | PRN

## 2023-08-08 MED ORDER — SODIUM CHLORIDE 0.9% IV SOLUTION: INTRAVENOUS | Status: DC

## 2023-08-08 MED ORDER — FENTANYL 2500MCG IN NS 250ML (10MCG/ML) PREMIX INFUSION
50.0000 ug/h | INTRAVENOUS | Status: DC
  Administered 2023-08-09 – 2023-08-10 (×3): 125 ug/h via INTRAVENOUS
  Filled 2023-08-08 (×3): qty 250

## 2023-08-08 MED ORDER — MIDAZOLAM HCL 2 MG/2ML IJ SOLN
INTRAMUSCULAR | Status: DC | PRN
  Administered 2023-08-08 (×3): 2 mg via INTRAVENOUS

## 2023-08-08 MED ORDER — ORAL CARE MOUTH RINSE: 15.0000 mL | OROMUCOSAL | Status: DC | PRN

## 2023-08-08 MED ORDER — HEPARIN (PORCINE) IN NACL 1000-0.9 UT/500ML-% IV SOLN
INTRAVENOUS | Status: DC | PRN
Start: 2023-08-08 — End: 2023-08-08
  Administered 2023-08-08: 1000 mL

## 2023-08-08 MED ORDER — PROPOFOL 1000 MG/100ML IV EMUL
INTRAVENOUS | Status: AC
  Filled 2023-08-08: qty 100

## 2023-08-08 MED ORDER — FUROSEMIDE 10 MG/ML IJ SOLN
INTRAVENOUS | Status: DC | PRN
  Administered 2023-08-08: 10 mg/h via INTRAVENOUS

## 2023-08-08 MED ORDER — MIDAZOLAM-SODIUM CHLORIDE 100-0.9 MG/100ML-% IV SOLN
INTRAVENOUS | Status: AC
  Filled 2023-08-08: qty 100

## 2023-08-08 MED ORDER — DEXTROSE 50 % IV SOLN: 0.0000 mL | INTRAVENOUS | Status: DC | PRN

## 2023-08-08 MED ORDER — CALCIUM GLUCONATE-NACL 2-0.675 GM/100ML-% IV SOLN
2.0000 g | Freq: Once | INTRAVENOUS | Status: AC
  Administered 2023-08-08: 2000 mg via INTRAVENOUS
  Filled 2023-08-08: qty 100

## 2023-08-08 MED ORDER — HEPARIN 30,000 UNITS/1000 ML (OHS) CELLSAVER SOLUTION
Status: DC
  Filled 2023-08-08: qty 1000

## 2023-08-08 MED ORDER — FENTANYL BOLUS VIA INFUSION
INTRAVENOUS | Status: DC | PRN
  Administered 2023-08-08 (×3): 50 ug via INTRAVENOUS

## 2023-08-08 MED ORDER — SODIUM BICARBONATE 8.4 % IV SOLN
INTRAVENOUS | Status: AC
  Administered 2023-08-08: 100 meq via INTRAVENOUS
  Filled 2023-08-08: qty 100

## 2023-08-08 MED ORDER — SODIUM BICARBONATE 8.4 % IV SOLN
INTRAVENOUS | Status: DC | PRN
  Administered 2023-08-08 (×2): 50 meq via INTRAVENOUS

## 2023-08-08 MED ORDER — SODIUM BICARBONATE 8.4 % IV SOLN: 100.0000 meq | Freq: Once | INTRAVENOUS | Status: AC

## 2023-08-08 MED ORDER — HEPARIN (PORCINE) 25000 UT/250ML-% IV SOLN
1500.0000 [IU]/h | INTRAVENOUS | Status: DC
  Administered 2023-08-08: 850 [IU]/h via INTRAVENOUS
  Administered 2023-08-10 – 2023-08-11 (×2): 1400 [IU]/h via INTRAVENOUS
  Administered 2023-08-11 – 2023-08-12 (×3): 1500 [IU]/h via INTRAVENOUS
  Filled 2023-08-08 (×7): qty 250

## 2023-08-08 MED ORDER — CEFAZOLIN SODIUM-DEXTROSE 2-4 GM/100ML-% IV SOLN
2.0000 g | INTRAVENOUS | Status: DC
  Filled 2023-08-08: qty 100

## 2023-08-08 MED ORDER — INSULIN ASPART 100 UNIT/ML IJ SOLN
0.0000 [IU] | INTRAMUSCULAR | Status: DC
  Administered 2023-08-08: 20 [IU] via SUBCUTANEOUS

## 2023-08-08 MED ORDER — ROCURONIUM BROMIDE 10 MG/ML (PF) SYRINGE
PREFILLED_SYRINGE | INTRAVENOUS | Status: DC
Start: 2023-08-08 — End: 2023-08-08
  Filled 2023-08-08: qty 10

## 2023-08-08 MED ORDER — SUCCINYLCHOLINE CHLORIDE 200 MG/10ML IV SOSY
PREFILLED_SYRINGE | INTRAVENOUS | Status: AC
  Filled 2023-08-08: qty 10

## 2023-08-08 MED ORDER — FAMOTIDINE 20 MG PO TABS: 20.0000 mg | ORAL_TABLET | Freq: Two times a day (BID) | ORAL | Status: DC

## 2023-08-08 MED ORDER — FENTANYL CITRATE (PF) 100 MCG/2ML IJ SOLN
INTRAMUSCULAR | Status: DC | PRN
  Administered 2023-08-08: 50 ug via INTRAVENOUS

## 2023-08-08 MED ORDER — MILRINONE LACTATE IN DEXTROSE 20-5 MG/100ML-% IV SOLN
0.1250 ug/kg/min | INTRAVENOUS | Status: AC
  Administered 2023-08-08: 0.125 ug/kg/min via INTRAVENOUS
  Administered 2023-08-09 – 2023-08-18 (×22): 0.25 ug/kg/min via INTRAVENOUS
  Administered 2023-08-19 – 2023-08-21 (×3): 0.125 ug/kg/min via INTRAVENOUS
  Filled 2023-08-08 (×25): qty 100

## 2023-08-08 MED ORDER — FUROSEMIDE 10 MG/ML IJ SOLN
80.0000 mg | Freq: Once | INTRAMUSCULAR | Status: AC
  Administered 2023-08-08: 80 mg via INTRAVENOUS
  Filled 2023-08-08: qty 8

## 2023-08-08 MED ORDER — ACETAMINOPHEN 325 MG PO TABS: 650.0000 mg | ORAL_TABLET | Freq: Four times a day (QID) | ORAL | Status: DC | PRN

## 2023-08-08 MED ORDER — FENTANYL BOLUS VIA INFUSION
50.0000 ug | INTRAVENOUS | Status: DC | PRN
  Administered 2023-08-08 (×3): 50 ug via INTRAVENOUS
  Administered 2023-08-08: 100 ug via INTRAVENOUS
  Administered 2023-08-09 – 2023-08-10 (×3): 50 ug via INTRAVENOUS

## 2023-08-08 MED ORDER — MELATONIN 3 MG PO TABS: 3.0000 mg | ORAL_TABLET | Freq: Every evening | ORAL | Status: DC | PRN

## 2023-08-08 MED ORDER — FUROSEMIDE 10 MG/ML IJ SOLN
10.0000 mg/h | INTRAVENOUS | Status: DC
  Filled 2023-08-08: qty 20

## 2023-08-08 MED ORDER — IOHEXOL 350 MG/ML SOLN
INTRAVENOUS | Status: DC | PRN
  Administered 2023-08-08: 30 mL

## 2023-08-08 MED ORDER — MIDAZOLAM HCL 2 MG/2ML IJ SOLN: 1.0000 mg | INTRAMUSCULAR | Status: DC | PRN

## 2023-08-08 MED ORDER — FUROSEMIDE 10 MG/ML IJ SOLN
120.0000 mg | Freq: Once | INTRAVENOUS | Status: DC
  Filled 2023-08-08: qty 12

## 2023-08-08 MED ORDER — PRAVASTATIN SODIUM 40 MG PO TABS
20.0000 mg | ORAL_TABLET | Freq: Every day | ORAL | Status: DC
Start: 2023-08-09 — End: 2023-08-11
  Administered 2023-08-09 – 2023-08-11 (×3): 20 mg
  Filled 2023-08-08 (×3): qty 1

## 2023-08-08 MED ORDER — POTASSIUM CHLORIDE 2 MEQ/ML IV SOLN
80.0000 meq | INTRAVENOUS | Status: DC
  Filled 2023-08-08: qty 40

## 2023-08-08 MED ORDER — NITROGLYCERIN IN D5W 200-5 MCG/ML-% IV SOLN
2.0000 ug/min | INTRAVENOUS | Status: DC
  Filled 2023-08-08: qty 250

## 2023-08-08 MED ORDER — FENTANYL CITRATE PF 50 MCG/ML IJ SOSY: 50.0000 ug | PREFILLED_SYRINGE | Freq: Once | INTRAMUSCULAR | Status: DC

## 2023-08-08 MED ORDER — MANNITOL 20 % IV SOLN
INTRAVENOUS | Status: DC
  Filled 2023-08-08: qty 13

## 2023-08-08 MED ORDER — SODIUM BICARBONATE 8.4 % IV SOLN
INTRAVENOUS | Status: AC
  Filled 2023-08-08: qty 100

## 2023-08-08 MED ORDER — DOCUSATE SODIUM 50 MG/5ML PO LIQD
100.0000 mg | Freq: Two times a day (BID) | ORAL | Status: DC
  Administered 2023-08-08 – 2023-08-11 (×6): 100 mg
  Filled 2023-08-08 (×7): qty 10

## 2023-08-08 MED ORDER — TRANEXAMIC ACID 1000 MG/10ML IV SOLN
1.5000 mg/kg/h | INTRAVENOUS | Status: DC
  Filled 2023-08-08: qty 25

## 2023-08-08 MED ORDER — PLASMA-LYTE A IV SOLN
INTRAVENOUS | Status: DC
  Filled 2023-08-08: qty 2.5

## 2023-08-08 MED ORDER — FENTANYL 2500MCG IN NS 250ML (10MCG/ML) PREMIX INFUSION
INTRAVENOUS | Status: AC
  Filled 2023-08-08: qty 250

## 2023-08-08 MED ORDER — VASOPRESSIN 20 UNITS/100 ML INFUSION FOR SHOCK
0.0000 [IU]/min | INTRAVENOUS | Status: DC
  Administered 2023-08-08 – 2023-08-10 (×5): 0.04 [IU]/min via INTRAVENOUS
  Filled 2023-08-08: qty 100
  Filled 2023-08-08: qty 200
  Filled 2023-08-08 (×3): qty 100

## 2023-08-08 MED ORDER — ACETAMINOPHEN 650 MG RE SUPP: 650.0000 mg | Freq: Four times a day (QID) | RECTAL | Status: DC | PRN

## 2023-08-08 MED ORDER — LIDOCAINE IN D5W 4-5 MG/ML-% IV SOLN
INTRAVENOUS | Status: AC
  Filled 2023-08-08: qty 500

## 2023-08-08 MED ORDER — FENTANYL 2500MCG IN NS 250ML (10MCG/ML) PREMIX INFUSION
INTRAVENOUS | Status: AC | PRN
  Administered 2023-08-08: 50 ug/h via INTRAVENOUS

## 2023-08-08 MED ORDER — DEXMEDETOMIDINE HCL IN NACL 400 MCG/100ML IV SOLN
0.1000 ug/kg/h | INTRAVENOUS | Status: DC
  Filled 2023-08-08: qty 100

## 2023-08-08 MED ORDER — LIDOCAINE HCL (PF) 1 % IJ SOLN
INTRAMUSCULAR | Status: DC | PRN
Start: 2023-08-08 — End: 2023-08-08
  Administered 2023-08-08 (×2): 5 mL

## 2023-08-08 MED ORDER — MIDAZOLAM HCL 2 MG/2ML IJ SOLN
INTRAMUSCULAR | Status: AC
  Filled 2023-08-08: qty 2

## 2023-08-08 MED ORDER — FUROSEMIDE 10 MG/ML IJ SOLN
INTRAMUSCULAR | Status: AC
Start: 2023-08-08 — End: ?
  Filled 2023-08-08: qty 8

## 2023-08-08 MED ORDER — PROPOFOL 1000 MG/100ML IV EMUL
0.0000 ug/kg/min | INTRAVENOUS | Status: DC
Start: 2023-08-08 — End: 2023-08-08
  Administered 2023-08-08: 20 ug/kg/min via INTRAVENOUS

## 2023-08-08 MED ORDER — PANTOPRAZOLE SODIUM 40 MG IV SOLR
40.0000 mg | Freq: Every day | INTRAVENOUS | Status: DC
  Administered 2023-08-08 – 2023-08-11 (×4): 40 mg via INTRAVENOUS
  Filled 2023-08-08 (×5): qty 10

## 2023-08-08 MED ORDER — PHENYLEPHRINE HCL-NACL 20-0.9 MG/250ML-% IV SOLN
30.0000 ug/min | INTRAVENOUS | Status: DC
  Filled 2023-08-08 (×2): qty 250

## 2023-08-08 MED ORDER — MILRINONE LACTATE IN DEXTROSE 20-5 MG/100ML-% IV SOLN
0.3000 ug/kg/min | INTRAVENOUS | Status: DC
  Filled 2023-08-08: qty 100

## 2023-08-08 MED ORDER — INSULIN REGULAR(HUMAN) IN NACL 100-0.9 UT/100ML-% IV SOLN
INTRAVENOUS | Status: DC
  Filled 2023-08-08 (×2): qty 100

## 2023-08-08 MED ORDER — SODIUM ZIRCONIUM CYCLOSILICATE 10 G PO PACK
10.0000 g | PACK | Freq: Once | ORAL | Status: AC
  Administered 2023-08-08: 10 g
  Filled 2023-08-08: qty 1

## 2023-08-08 MED ORDER — PHENYLEPHRINE 80 MCG/ML (10ML) SYRINGE FOR IV PUSH (FOR BLOOD PRESSURE SUPPORT)
PREFILLED_SYRINGE | INTRAVENOUS | Status: AC
  Filled 2023-08-08: qty 10

## 2023-08-08 MED ORDER — LIDOCAINE HCL (PF) 1 % IJ SOLN
INTRAMUSCULAR | Status: AC
  Filled 2023-08-08: qty 30

## 2023-08-08 MED ORDER — CHLORHEXIDINE GLUCONATE CLOTH 2 % EX PADS
6.0000 | MEDICATED_PAD | Freq: Every day | CUTANEOUS | Status: DC
  Administered 2023-08-08 – 2023-08-13 (×6): 6 via TOPICAL

## 2023-08-08 MED ORDER — SUCCINYLCHOLINE CHLORIDE 200 MG/10ML IV SOSY
PREFILLED_SYRINGE | INTRAVENOUS | Status: DC | PRN
  Administered 2023-08-08: 100 mg via INTRAVENOUS

## 2023-08-08 MED ORDER — VASOPRESSIN 20 UNITS/100 ML INFUSION FOR SHOCK
INTRAVENOUS | Status: AC
  Administered 2023-08-08: 0.04 [IU]/min via INTRAVENOUS
  Filled 2023-08-08: qty 100

## 2023-08-08 MED ORDER — MIDAZOLAM HCL 2 MG/2ML IJ SOLN
INTRAMUSCULAR | Status: AC
Start: 2023-08-08 — End: 2023-08-08
  Filled 2023-08-08: qty 2

## 2023-08-08 MED ORDER — DEXMEDETOMIDINE HCL IN NACL 400 MCG/100ML IV SOLN
0.0000 ug/kg/h | INTRAVENOUS | Status: DC
  Administered 2023-08-11: 0.4 ug/kg/h via INTRAVENOUS
  Administered 2023-08-12: 0 ug/kg/h via INTRAVENOUS
  Filled 2023-08-08: qty 200

## 2023-08-08 MED ORDER — TRANEXAMIC ACID (OHS) PUMP PRIME SOLUTION
2.0000 mg/kg | INTRAVENOUS | Status: DC
  Filled 2023-08-08: qty 2.36

## 2023-08-08 MED ORDER — FENTANYL CITRATE (PF) 100 MCG/2ML IJ SOLN: 50.0000 ug | Freq: Once | INTRAMUSCULAR | Status: DC

## 2023-08-08 MED ORDER — NOREPINEPHRINE 4 MG/250ML-% IV SOLN
INTRAVENOUS | Status: AC
  Filled 2023-08-08: qty 250

## 2023-08-08 SURGICAL SUPPLY — 11 items
BALLOON IABP SENS PLUS 7.5F40C (BALLOONS) IMPLANT
CATH INFINITI 5FR MULTPACK ANG (CATHETERS) IMPLANT
CATH SWAN GANZ 7F STRAIGHT (CATHETERS) IMPLANT
KIT MICROPUNCTURE NIT STIFF (SHEATH) IMPLANT
PACK CARDIAC CATHETERIZATION (CUSTOM PROCEDURE TRAY) ×1 IMPLANT
SET ATX-X65L (MISCELLANEOUS) IMPLANT
SHEATH PINNACLE 5F 10CM (SHEATH) IMPLANT
SHEATH PINNACLE 7F 10CM (SHEATH) IMPLANT
TUBING ART PRESS 72 MALE/FEM (TUBING) IMPLANT
WIRE EMERALD 3MM-J .035X150CM (WIRE) IMPLANT
WIRE MICROINTRODUCER 60CM (WIRE) IMPLANT

## 2023-08-08 NOTE — Code Documentation (Signed)
 Brief Stroke Response Note  Code stroke activated on this pt at [phone] for sudden onset of unresponsiveness. Stroke team to bedside. Brief exam by neurologist reveals no focal abnormalities. BP 164/120. CBG 267. Pt in respiratory distress. Per CCM, Respiratory status and blood gases likely explain her exam. Plan: Cancel Code Stroke. Once patient intubated and gas exchange corrected, re-activate code stroke if neurological deficit found.   Addendum: Pt seen on 2 H. Multiple vasoactive  gtts and IABP in use. Per NP, pt briefly followed commands when sedation paused for exam. RT at bedside attempting repeat ABG. Plan discussed with 2 H RN.  Mechele Spiegel RN Stroke Response [id number]

## 2023-08-08 NOTE — Interval H&P Note (Signed)
 History and Physical Interval Note:  08/08/2023 9:21 AM  Melissa Bray  has presented today for surgery, with the diagnosis of Pre-Op For endocarditis.  The various methods of treatment have been discussed with the patient and family. After consideration of risks, benefits and other options for treatment, the patient has consented to  Procedure(s): LEFT HEART CATH AND CORONARY ANGIOGRAPHY (N/A) as a surgical intervention.  The patient's history has been reviewed, patient examined, no change in status, stable for surgery.  I have reviewed the patient's chart and labs.  Questions were answered to the patient's satisfaction.     Donata Fryer [facility] 08/08/2023 9:21 AM

## 2023-08-08 NOTE — Consult Note (Signed)
 NAME:  Melissa Bray, MRN:  [id number], DOB:  [birth year], LOS: 6 ADMISSION DATE:  07/31/2023, CONSULTATION DATE:  5/9 REFERRING MD:  Mitzie Anda, CHIEF COMPLAINT:  respiratory failure   History of Present Illness:  Melissa Bray is a 62 y/o woman with a history of DM, HTN who presented on 5/1 with cellulitis of her LUE and sepsis. At presentation she complained of fevers, generalized weakness, and reduced PO intake. During her evaluation she was diagnosed with GBS bacteremia and mitral valve endocarditis with leaflet perforation. She required dental extractions for dental carries. Plans have been in place for mitral valve repair by TCTS. She was planned for [facility] today; after coming to the cath lab and going through timeout procedure she was moved to the cath lab table. She very quickly got distressed lying back, trying to sit up. She was placed on bipap for respiratory distress. She suddenly went unresponsive during this time without ongoing agitation. No sedation was given. PCCM was consulted in cath lab for emergent management of respiratory failure.    Pertinent  Medical History  MD HTN HLD Breast cancer, s/p left mastectomy  Significant Hospital Events: Including procedures, antibiotic start and stop dates in addition to other pertinent events     Interim History / Subjective:    Objective    Blood pressure (!) 117/97, pulse 99, temperature 98.5 F (36.9 C), temperature source Oral, resp. rate 18, height 5\' 5"  (1.651 m), weight 117.9 kg, SpO2 94%.        Intake/Output Summary (Last 24 hours) at 08/08/2023 1019 Last data filed at 08/08/2023 0321 Gross per 24 hour  Intake 3101.4 ml  Output 10 ml  Net 3091.4 ml   Filed Weights   08/06/23 0340 08/07/23 0311 08/07/23 1345  Weight: 117.8 kg 118.2 kg 117.9 kg    Examination: General: ill appearing woman lying in bed in NAD HENT: [place]/AT, eyes anicteric Lungs: low volumes on bipap> increased with more inspiratory support. Coarse rhales  bilaterally to apices. Copious frothy secretions  Cardiovascular: S1S2, tachycardic, reg rhythm. Difficult to auscultate murmur over rhales. Abdomen: soft, NT Extremities: hands cool, no significant edema Neuro: not blinking to threat, following commands, or withdrawing from pain Derm: no diffuse rashes  Resolved Hospital Problem list     Assessment & Plan:  Acute respiratory failure with hypoxia and hypercapnia Acute pulmonary edema; flashed  due to orthopnea and hypertension -intubated emergently -LTVV; 8cc/kg. May need to drop Vt based on follow up gas if compliance doesn't improve. -aggressively ventilating to reverse hypercapnia for neuro assessment and severe acidosis -PAD protocol; start propofol  when she wakes up -VAP prevention protocol -needs diuresis -daily SAT & SBT once appropriate  MR due to MV endocarditis Acute HFpEF due to severe MR -hydralazine x 2 doses to get BP decreased -LHC to be completed this morning; may need IABP to optimize for decompensated heart failure -lasix -afterload reduction with hydralazine  Acute encephalopathy likely due to hypercapnia, exam nonfocal. With left sided endocarditis, need to r/o embolic stroke.  -Code stroke called> d/w Neuro. Think hypercapnia more likely. Not a TNK candidate with endocarditis. If after paralytics wear off she is still concerning for stroke, would get head CT to r/o bleeding  Hyponatremia -avoid hypotonic fluids  Hyperglycemia -SSI PRN -gaol bG 140-180  Anemia -transfuse for Hb <7 or hemodynamically significant bleeding -monitor    Best Practice (right click and "Reselect all SmartList Selections" daily)   Diet/type: NPO DVT prophylaxis SCD Pressure ulcer(s): N/A GI prophylaxis: PPI  Lines: Central line Foley:  Yes, and it is still needed Code Status:  full code Last date of multidisciplinary goals of care discussion [no family at bedside]  Labs   CBC: Recent Labs  Lab 08/02/23 0819  08/04/23 0509 08/06/23 1046 08/07/23 0421 08/08/23 0322  WBC 5.9 4.8 6.6 5.7 6.0  HGB 12.0 12.1 11.7* 10.9* 11.7*  HCT 36.7 36.9 35.0* 33.3* 36.1  MCV 85.5 85.4 84.1 85.8 86.8  PLT 152 198 235 218 241    Basic Metabolic Panel: Recent Labs  Lab 08/02/23 0819 08/04/23 0509 08/05/23 0419 08/06/23 1046 08/07/23 0421 08/08/23 0322  NA 134* 130*  --  134* 135 133*  K 3.8 3.1*  --  4.2 4.2 4.4  CL 99 93*  --  100 102 101  CO2 24 25  --  25 26 23   GLUCOSE 211* 374*  --  236* 178* 205*  BUN 11 9  --  8 7* 7*  CREATININE 0.73 0.94  --  0.67 0.71 0.61  CALCIUM 8.6* 8.4*  --  9.2 8.9 8.9  MG 1.8  --  1.5* 1.7 2.1  --    GFR: Estimated Creatinine Clearance: 91.3 mL/min (by C-G formula based on SCr of 0.61 mg/dL). Recent Labs  Lab 08/04/23 0509 08/06/23 1046 08/07/23 0421 08/08/23 0322  WBC 4.8 6.6 5.7 6.0    Liver Function Tests: Recent Labs  Lab 08/04/23 0509  AST 17  ALT 13  ALKPHOS 50  BILITOT 0.4  PROT 6.6  ALBUMIN  2.7*   No results for input(s): "LIPASE", "AMYLASE" in the last 168 hours. No results for input(s): "AMMONIA" in the last 168 hours.  ABG    Component Value Date/Time   HCO3 28.5 (H) 08/01/2023 0537   TCO2 30 09/16/2021 1624   O2SAT 49.6 08/01/2023 0537     Coagulation Profile: No results for input(s): "INR", "PROTIME" in the last 168 hours.  Cardiac Enzymes: No results for input(s): "CKTOTAL", "CKMB", "CKMBINDEX", "TROPONINI" in the last 168 hours.  HbA1C: Hgb A1c MFr Bld  Date/Time Value Ref Range Status  07/31/2023 08:09 AM 12.8 (H) 4.6 - 6.5 % Final    Comment:    Glycemic Control Guidelines for People with Diabetes:Non Diabetic:  <6%Goal of Therapy: <7%Additional Action Suggested:  >8%   04/03/2023 02:52 PM 13.8 (H) 4.6 - 6.5 % Final    Comment:    Glycemic Control Guidelines for People with Diabetes:Non Diabetic:  <6%Goal of Therapy: <7%Additional Action Suggested:  >8%     CBG: Recent Labs  Lab 08/07/23 1552 08/07/23 1648  08/07/23 2119 08/08/23 0655 08/08/23 0913  GLUCAP 175* 152* 164* 180* 185*    Review of Systems:   Unable to be obtained due to mental status.   Past Medical History:  She,  has a past medical history of Anxiety, Breast cancer (HCC), Diabetes mellitus without complication (HCC), Hyperlipidemia, Hypertension, and Type 2 diabetes mellitus without retinopathy (HCC) (06/17/2017).   Surgical History:   Past Surgical History:  Procedure Laterality Date   ABDOMINAL HYSTERECTOMY     BREAST BIOPSY     MASTECTOMY Left 2016   TOOTH EXTRACTION N/A 08/07/2023   Procedure: EXTRACTION TEETH NUMBERS ELEVEN, THIRTEEN, AND FOURTEEN;  Surgeon: Ascencion Lava, DMD;  Location: [facility] OR;  Service: Oral Surgery;  Laterality: N/A;   TRANSESOPHAGEAL ECHOCARDIOGRAM (CATH LAB) N/A 08/04/2023   Procedure: TRANSESOPHAGEAL ECHOCARDIOGRAM;  Surgeon: Hazle Lites, MD;  Location: [facility] INVASIVE CV LAB;  Service: Cardiovascular;  Laterality: N/A;  Social History:   reports that she has never smoked. She has never been exposed to tobacco smoke. She has never used smokeless tobacco. She reports that she does not drink alcohol and does not use drugs.   Family History:  Her family history includes Asthma in her brother and mother; Cancer in her brother and father; Diabetes in her father; Hypertension in her father. There is no history of Colon cancer, Colon polyps, Crohn's disease, Esophageal cancer, Rectal cancer, Stomach cancer, or Ulcerative colitis.   Allergies No Known Allergies   Home Medications  Prior to Admission medications   Medication Sig Start Date End Date Taking? Authorizing Provider  alendronate  (FOSAMAX ) 70 MG tablet Take 1 tablet (70 mg total) by mouth once a week. Take with a full glass of water on an empty stomach. 04/04/23  Yes Nche, Connye Delaine, NP  amLODipine  (NORVASC ) 10 MG tablet Take 1 tablet (10 mg total) by mouth at bedtime. Patient taking differently: Take 20 mg by mouth at bedtime.  05/16/23  Yes Nche, Connye Delaine, NP  anastrozole  (ARIMIDEX ) 1 MG tablet Take 1 mg by mouth daily.   Yes [provider]  cloNIDine  (CATAPRES ) 0.1 MG tablet Take 1 tablet (0.1 mg total) by mouth 2 (two) times daily. 04/04/23  Yes Nche, Connye Delaine, NP  glipiZIDE  (GLUCOTROL  XL) 10 MG 24 hr tablet Take 1 tablet (10 mg total) by mouth daily with breakfast. 04/04/23  Yes Nche, Connye Delaine, NP  insulin  glargine (LANTUS ) 100 UNIT/ML Solostar Pen Inject 20 Units into the skin daily. 05/16/23  Yes Nche, Connye Delaine, NP  lisinopril  (ZESTRIL ) 40 MG tablet Take 1 tablet (40 mg total) by mouth daily. 04/04/23  Yes Nche, Connye Delaine, NP  metFORMIN  (GLUCOPHAGE ) 850 MG tablet Take 1 tablet (850 mg total) by mouth 2 (two) times daily with a meal. 05/28/23  Yes Nche, Connye Delaine, NP  metoprolol  succinate (TOPROL -XL) 100 MG 24 hr tablet Take 1 tablet (100 mg total) by mouth daily. 04/04/23  Yes Nche, Connye Delaine, NP  pravastatin  (PRAVACHOL ) 20 MG tablet Take 1 tablet (20 mg total) by mouth daily. 04/04/23  Yes Nche, Connye Delaine, NP  Vitamin D , Ergocalciferol , (DRISDOL ) 1.25 MG (50000 UNIT) CAPS capsule TAKE 1 CAPSULE ONCE A WEEK FOR 12 WEEKS. AFTER 12 WEEKS SWITCH TO OVER THE COUNTER VITAMIN D  2000 IU 04/04/23  Yes Nche, Connye Delaine, NP  Continuous Glucose Sensor (FREESTYLE LIBRE 3 PLUS SENSOR) MISC Inject 1 Units into the skin every 14 (fourteen) days. Change sensor every 15 days. 06/27/23   Nche, Connye Delaine, NP  Insulin  Pen Needle (PEN NEEDLES) 31G X 6 MM MISC 1 application  by Does not apply route at bedtime. 04/04/23   Nche, Connye Delaine, NP     Critical care time: 55 min.      Joesph Mussel, DO 08/08/23 10:19 AM [facility]  For contact information, see Amion. If no response to pager, please call PCCM consult pager. After hours, 7PM- 7AM, please call Elink.

## 2023-08-08 NOTE — Progress Notes (Signed)
 PHARMACY - ANTICOAGULATION CONSULT NOTE  Pharmacy Consult for heparin Indication: IABP  No Known Allergies  Patient Measurements: Height: 5\' 5"  (165.1 cm) Weight: 117.9 kg (260 lb) IBW/kg (Calculated) : 57 HEPARIN DW (KG): 85.3  Vital Signs: Temp: 98.5 F (36.9 C) (05/09 0728) Temp Source: Oral (05/09 0728) BP: 145/81 (05/09 1202) Pulse Rate: 111 (05/09 1202)  Labs: Recent Labs    08/06/23 1046 08/07/23 0421 08/08/23 0322  HGB 11.7* 10.9* 11.7*  HCT 35.0* 33.3* 36.1  PLT 235 218 241  CREATININE 0.67 0.71 0.61    Estimated Creatinine Clearance: 91.3 mL/min (by C-G formula based on SCr of 0.61 mg/dL).   Medical History: Past Medical History:  Diagnosis Date   Anxiety    Breast cancer (HCC)    Diabetes mellitus without complication (HCC)    Hyperlipidemia    Hypertension    Type 2 diabetes mellitus without retinopathy (HCC) 06/17/2017    Medications:  Scheduled:   [START ON 08/09/2023] anastrozole   1 mg Per Tube Daily   Chlorhexidine  Gluconate Cloth  6 each Topical Daily   docusate  100 mg Per Tube BID   etomidate       fentaNYL        fentaNYL  (SUBLIMAZE ) injection  50 mcg Intravenous Once   furosemide  80 mg Intravenous Once   insulin  aspart  0-20 Units Subcutaneous Q4H   midazolam        mouth rinse  15 mL Mouth Rinse Q2H   pantoprazole (PROTONIX) IV  40 mg Intravenous Daily   polyethylene glycol  17 g Per Tube Daily   [START ON 08/09/2023] pravastatin   20 mg Per Tube Daily   succinylcholine        Assessment: 52 yof presenting with acute mitral valve endocarditis with perforated mitral leaflet and severe MR. Underwent dental extraction 5/8. Presented for pre-op RHC and developed flash pulmonary edema requiring intubation - IABP was placed in lab. No AC PTA. Start heparin for IABP.  Hgb 11.7, plt 241. No s/sx of bleeding. Discussed with team and okay to start heparin.  Goal of Therapy:  Heparin level 0.2-0.5 units/ml Monitor platelets by  anticoagulation protocol: Yes   Plan:  Start heparin infusion at 850 units/hr Order heparin level in 6 hours Monitor daily HL, CBC, and for s/sx of bleeding   Thank you for allowing pharmacy to participate in this patient's care,  Nieves Bars, PharmD, BCCCP Clinical Pharmacist  Phone: [phone] 08/08/2023 2:26 PM  Please check AMION for all [facility] Pharmacy phone numbers After 10:00 PM, call Main Pharmacy [phone]

## 2023-08-08 NOTE — Procedures (Signed)
 Intubation Procedure Note  Hydeia Oshita  [id number]  Dec 09, 1960  Date:08/08/23  Time:10:16 AM   Provider Performing:Raequan Vanschaick P [place] Job    Procedure: Intubation (31500)  Indication(s) Respiratory Failure  Consent Unable to obtain consent due to inability to find a medical decision maker for patient.  All reasonable efforts were made.  Another independent medical provider, Dr Mitzie Anda, Dr [place] , confirmed the benefits of this procedure outweigh the risks.   Anesthesia Versed , Fentanyl , and Succinylcholine   Time Out Verified patient identification, verified procedure, site/side was marked, verified correct patient position, special equipment/implants available, medications/allergies/relevant history reviewed, required imaging and test results available.   Sterile Technique Usual hand hygeine, masks, and gloves were used   Procedure Description Patient positioned in bed supine.  Sedation given as noted above.  Patient was intubated with endotracheal tube using Glidescope.  View was Grade 1 full glottis .  Number of attempts was 1.  Colorimetric CO2 detector was consistent with tracheal placement. Copious frothy secretions bubbling out of her nose, fulling her pharynx prior to intubation.   Complications/Tolerance None; patient tolerated the procedure well. Chest X-ray is ordered to verify placement.   EBL Minimal   Specimen(s) None  Joesph Mussel, DO 08/08/23 10:17 AM [facility]  For contact information, see Amion. If no response to pager, please call PCCM consult pager. After hours, 7PM- 7AM, please call Elink.

## 2023-08-08 NOTE — Progress Notes (Signed)
 PHARMACY - ANTICOAGULATION CONSULT NOTE  Pharmacy Consult for heparin Indication: IABP  No Known Allergies  Patient Measurements: Height: 5\' 5"  (165.1 cm) Weight: 117.9 kg (260 lb) IBW/kg (Calculated) : 57 HEPARIN DW (KG): 85.3  Vital Signs: Temp: 99 F (37.2 C) (05/09 2030) Temp Source: Core (05/09 1600) BP: 98/55 (05/09 1445) Pulse Rate: 89 (05/09 2030)  Labs: Recent Labs    08/06/23 1046 08/07/23 0421 08/08/23 0322 08/08/23 0956 08/08/23 1126 08/08/23 1128 08/08/23 1405 08/08/23 1634 08/08/23 1637 08/08/23 2118  HGB 11.7* 10.9* 11.7*   < > 14.6  14.3 14.3  --  13.6  --   --   HCT 35.0* 33.3* 36.1   < > 43.0  42.0 42.0  --  40.0  --   --   PLT 235 218 241  --   --   --   --   --   --   --   HEPARINUNFRC  --   --   --   --   --   --   --   --   --  0.10*  CREATININE 0.67 0.71 0.61  --   --   --  1.13*  --  1.17*  --    < > = values in this interval not displayed.    Estimated Creatinine Clearance: 62.4 mL/min (A) (by C-G formula based on SCr of 1.17 mg/dL (H)).   Medical History: Past Medical History:  Diagnosis Date   Anxiety    Breast cancer (HCC)    Diabetes mellitus without complication (HCC)    Hyperlipidemia    Hypertension    Type 2 diabetes mellitus without retinopathy (HCC) 06/17/2017      Assessment: 62 yof presenting with acute mitral valve endocarditis with perforated mitral leaflet and severe MR. Underwent dental extraction 5/8. Presented for pre-op RHC and developed flash pulmonary edema requiring intubation - IABP was placed in lab. No AC PTA. Start heparin for IABP.  Heparin level drawn less than 5.5 hrs from starting heparin. Level 0.1 is subtherapeutic on 850 units/hr. Hgb stable.  No issues with infusion or bleeding per RN.  Goal of Therapy:  Heparin level 0.2-0.5 units/ml Monitor platelets by anticoagulation protocol: Yes   Plan:  Increase heparin infusion to 950 units/hr  Monitor daily HL, CBC, and for s/sx of bleeding    Thank you for allowing pharmacy to participate in this patient's care,  Dorene Gang, PharmD, BCPS, [facility] Clinical Pharmacist  Please check AMION for all [facility] Pharm[facility] phone numbers After 10:00 PM, call Main Pharmacy [phone]

## 2023-08-08 NOTE — Progress Notes (Signed)
 Patient unresponsive earlier in cath lab. ABG with elevated co2 and respiratory acidosis. Brought to icu w/ light sedation. Was waking up and mae purposefully. Will hold on CT head. Repeat abg co2 46.   Updated son Micheal over phone. Plans to come in later today after work.  JD Carliss Chess [facility] 08/08/2023, 3:35 PM  Please see [email or web address] for pager details.  From 7A-7P if no response, please call [phone]. After hours, please call ELink [phone].

## 2023-08-08 NOTE — Progress Notes (Signed)
 Chaplain receives request via phone call from [place] of [employer], where pt works, asking that chaplain provide pastoral visit. Chaplain does so and finds pt accompanied by sister Devra Fontana, who expresses faith in the [place] and denies needs at this time. Family has experienced death of her son's father recently. Chaplain encourages Devra Fontana to request support visit if needed, and she expresses appreciation.

## 2023-08-08 NOTE — Progress Notes (Signed)
 Pre-CABG exams were attempted, however patient was taken to CATH lab.  Will re attempt exams as schedule and patient availability permit.   [place], RVT/RDMS 08/08/2023 [phone]

## 2023-08-08 NOTE — Consult Note (Addendum)
 Advanced Heart Failure Team Consult Note   Primary Physician: Nche, Connye Delaine, NP Cardiologist:  Eilleen Grates, MD  Reason for Consultation: Severe MR HPI:    Melissa Bray is seen today for evaluation of severe MR at the request of Dr. Swaziland.   Melissa Bray is a 63 year old female with PMH of HTN, HLD, T2DM, bresat cancer s/p L masectomy. No known cardiac history. Myoview in 2017 at [facility] was low risk and negative for ischemia.   Presented to [facility] on 5/1 with weakness and fevers. She was found to be in severe sepsis felt to be due to LUE cellulitis. Vitals notable for fever, ST in low 100s, and hypotensive. Lab notable for Na 131, K 3.6, glucose 246, Cr 0.99, LA 1.4, A1C 12.8. Blood cultures positive for streptococcus agalacticae. Started on antibiotics and given 2L IVF + IVMF. EKG showed ST 106 bpm. She was admitted to [facility]. LUE DVT study negative. Murmur found on exam and echo ordered, which showed EF 65-70% and nl RV, mod to likely severe MR.   She was transferred to [facility] for TEE showing acute mitral valve endocarditis with perforated mitral leaflet and severe MR (with mobile vegetation of both MV leaflets), no LAA thrombus, aneurysmal IAS with R bowing (no shunt), and normal EF. CTS was consulted for surgical repair. During surgical evaluation orthopanogram showed likely dental infectious source with evidence of abscess. Underwent dental extraction 5/8. During pre-operative RHC for cardiac optimization, she developed flash pulmonary edema requiring intubation while on the table, likely related to MR and high IVF volume received pre-operatively. Now requiring high-dose pressors. Started on IV Lasix gtt, however once intubated LVEDP and PCWP were 12 and 11 respectively so Lasix stopped.   L/RHC: no CAD, RA 9, PA 34/21 (27), PCWP 13, LVEDP 12, PAPi 1.4  Transferred to ICU, intubated and lightly sedated. On NE 30. IABP 1:1. Continues  with pink frothy sputum up ETT.   Home Medications Prior to Admission medications   Medication Sig Start Date End Date Taking? Authorizing Provider  alendronate  (FOSAMAX ) 70 MG tablet Take 1 tablet (70 mg total) by mouth once a week. Take with a full glass of water on an empty stomach. 04/04/23  Yes Nche, Connye Delaine, NP  amLODipine  (NORVASC ) 10 MG tablet Take 1 tablet (10 mg total) by mouth at bedtime. Patient taking differently: Take 20 mg by mouth at bedtime. 05/16/23  Yes Nche, Connye Delaine, NP  anastrozole  (ARIMIDEX ) 1 MG tablet Take 1 mg by mouth daily.   Yes [provider]  cloNIDine  (CATAPRES ) 0.1 MG tablet Take 1 tablet (0.1 mg total) by mouth 2 (two) times daily. 04/04/23  Yes Nche, Connye Delaine, NP  glipiZIDE  (GLUCOTROL  XL) 10 MG 24 hr tablet Take 1 tablet (10 mg total) by mouth daily with breakfast. 04/04/23  Yes Nche, Connye Delaine, NP  insulin  glargine (LANTUS ) 100 UNIT/ML Solostar Pen Inject 20 Units into the skin daily. 05/16/23  Yes Nche, Connye Delaine, NP  lisinopril  (ZESTRIL ) 40 MG tablet Take 1 tablet (40 mg total) by mouth daily. 04/04/23  Yes Nche, Connye Delaine, NP  metFORMIN  (GLUCOPHAGE ) 850 MG tablet Take 1 tablet (850 mg total) by mouth 2 (two) times daily with a meal. 05/28/23  Yes Nche, Connye Delaine, NP  metoprolol  succinate (TOPROL -XL) 100 MG 24 hr tablet Take 1 tablet (100 mg total) by mouth daily. 04/04/23  Yes Nche, Connye Delaine, NP  pravastatin  (PRAVACHOL ) 20  MG tablet Take 1 tablet (20 mg total) by mouth daily. 04/04/23  Yes Nche, Connye Delaine, NP  Vitamin D , Ergocalciferol , (DRISDOL ) 1.25 MG (50000 UNIT) CAPS capsule TAKE 1 CAPSULE ONCE A WEEK FOR 12 WEEKS. AFTER 12 WEEKS SWITCH TO OVER THE COUNTER VITAMIN D  2000 IU 04/04/23  Yes Nche, Connye Delaine, NP  Continuous Glucose Sensor (FREESTYLE LIBRE 3 PLUS SENSOR) MISC Inject 1 Units into the skin every 14 (fourteen) days. Change sensor every 15 days. 06/27/23   Nche, Connye Delaine, NP  Insulin  Pen Needle (PEN  NEEDLES) 31G X 6 MM MISC 1 application  by Does not apply route at bedtime. 04/04/23   NcheConnye Delaine, NP    Past Medical History: Past Medical History:  Diagnosis Date   Anxiety    Breast cancer (HCC)    Diabetes mellitus without complication (HCC)    Hyperlipidemia    Hypertension    Type 2 diabetes mellitus without retinopathy (HCC) 06/17/2017    Past Surgical History: Past Surgical History:  Procedure Laterality Date   ABDOMINAL HYSTERECTOMY     BREAST BIOPSY     MASTECTOMY Left 2016   TOOTH EXTRACTION N/A 08/07/2023   Procedure: EXTRACTION TEETH NUMBERS ELEVEN, THIRTEEN, AND FOURTEEN;  Surgeon: Ascencion Lava, DMD;  Location: [facility] OR;  Service: Oral Surgery;  Laterality: N/A;   TRANSESOPHAGEAL ECHOCARDIOGRAM (CATH LAB) N/A 08/04/2023   Procedure: TRANSESOPHAGEAL ECHOCARDIOGRAM;  Surgeon: Hazle Lites, MD;  Location: [facility] INVASIVE CV LAB;  Service: Cardiovascular;  Laterality: N/A;    Family History: Family History  Problem Relation Age of Onset   Asthma Mother    Cancer Father        prostate cancer   Diabetes Father    Hypertension Father    Cancer Brother        prostate cancer   Asthma Brother    Colon cancer Neg Hx    Colon polyps Neg Hx    Crohn's disease Neg Hx    Esophageal cancer Neg Hx    Rectal cancer Neg Hx    Stomach cancer Neg Hx    Ulcerative colitis Neg Hx     Social History: Social History   Socioeconomic History   Marital status: Divorced    Spouse name: Not on file   Number of children: 2   Years of education: Not on file   Highest education level: Not on file  Occupational History   Not on file  Tobacco Use   Smoking status: Never    Passive exposure: Never   Smokeless tobacco: Never  Vaping Use   Vaping status: Never Used  Substance and Sexual Activity   Alcohol use: Never   Drug use: Never   Sexual activity: Not Currently    Birth control/protection: Surgical  Other Topics Concern   Not on file  Social History Narrative    Not on file   Social Drivers of Health   Financial Resource Strain: Not on file  Food Insecurity: No Food Insecurity (07/31/2023)   Hunger Vital Sign    Worried About Running Out of Food in the Last Year: Never true    Ran Out of Food in the Last Year: Never true  Transportation Needs: No Transportation Needs (07/31/2023)   PRAPARE - Administrator, Civil Service (Medical): No    Lack of Transportation (Non-Medical): No  Physical Activity: Not on file  Stress: Not on file  Social Connections: Not on file    Allergies:  No  Known Allergies  Objective:    Vital Signs:   Temp:  [97.7 F (36.5 C)-99 F (37.2 C)] 98.5 F (36.9 C) (05/09 0728) Pulse Rate:  [87-109] 99 (05/09 0728) Resp:  [18-31] 18 (05/09 0728) BP: (102-120)/(67-97) 117/97 (05/09 0728) SpO2:  [87 %-99 %] 94 % (05/09 0728) Weight:  [117.9 kg] 117.9 kg (05/08 1345) Last BM Date : 08/03/23  Weight change: Filed Weights   08/06/23 0340 08/07/23 0311 08/07/23 1345  Weight: 117.8 kg 118.2 kg 117.9 kg   Intake/Output:  Intake/Output Summary (Last 24 hours) at 08/08/2023 1041 Last data filed at 08/08/2023 0321 Gross per 24 hour  Intake 3101.4 ml  Output 10 ml  Net 3091.4 ml    Physical Exam    CVP pending General: Obese appearing. Intubated Cardiac: S1 and S2 present. 4/6 systolic apex murmur Resp: pink, frothy sputum up ETT Extremities: Warm and dry.  No peripheral edema.  Neuro: Sedated Lines: Foley, ETT, IABP + sheath, aline, OGT  Telemetry   ST 100s (personally reviewed)  EKG    N/A  Labs   Basic Metabolic Panel: Recent Labs  Lab 08/02/23 0819 08/04/23 0509 08/05/23 0419 08/06/23 1046 08/07/23 0421 08/08/23 0322  NA 134* 130*  --  134* 135 133*  K 3.8 3.1*  --  4.2 4.2 4.4  CL 99 93*  --  100 102 101  CO2 24 25  --  25 26 23   GLUCOSE 211* 374*  --  236* 178* 205*  BUN 11 9  --  8 7* 7*  CREATININE 0.73 0.94  --  0.67 0.71 0.61  CALCIUM 8.6* 8.4*  --  9.2 8.9 8.9  MG 1.8  --   1.5* 1.7 2.1  --    Liver Function Tests: Recent Labs  Lab 08/04/23 0509  AST 17  ALT 13  ALKPHOS 50  BILITOT 0.4  PROT 6.6  ALBUMIN  2.7*   CBC: Recent Labs  Lab 08/02/23 0819 08/04/23 0509 08/06/23 1046 08/07/23 0421 08/08/23 0322  WBC 5.9 4.8 6.6 5.7 6.0  HGB 12.0 12.1 11.7* 10.9* 11.7*  HCT 36.7 36.9 35.0* 33.3* 36.1  MCV 85.5 85.4 84.1 85.8 86.8  PLT 152 198 235 218 241   CBG: Recent Labs  Lab 08/07/23 1648 08/07/23 2119 08/08/23 0655 08/08/23 0913 08/08/23 1008  GLUCAP 152* 164* 180* 185* 267*   Imaging   No results found.  Medications:    Current Medications:  [MAR Hold] anastrozole   1 mg Oral Daily   docusate  100 mg Per Tube BID   [MAR Hold] DULoxetine   30 mg Oral Daily   etomidate       [MAR Hold] feeding supplement (GLUCERNA SHAKE)  237 mL Oral TID BM   fentaNYL        [MAR Hold] insulin  aspart  0-20 Units Subcutaneous TID WC   [MAR Hold] insulin  aspart  0-5 Units Subcutaneous QHS   [MAR Hold] insulin  aspart  5 Units Subcutaneous TID WC   [MAR Hold] insulin  glargine-yfgn  23 Units Subcutaneous Q2200   ketamine HCl       [MAR Hold] magnesium  oxide  400 mg Oral BID   [MAR Hold] metoprolol  succinate  50 mg Oral Daily   midazolam        polyethylene glycol  17 g Per Tube Daily   [MAR Hold] pravastatin   20 mg Oral Daily   rocuronium        succinylcholine       Infusions:  sodium chloride  1 mL/kg/hr (  08/08/23 0751)   fentaNYL      fentaNYL  infusion INTRAVENOUS     fentaNYL  infusion INTRAVENOUS 50 mcg/hr (08/08/23 1040)   furosemide (LASIX) 200 mg in dextrose  5 % 100 mL (2 mg/mL) infusion     [MAR Hold] lactated ringers      And   [MAR Hold] lactated ringers      norepinephrine (LEVOPHED) 4 mg in dextrose  5 % 250 mL (0.016 mg/mL) infusion 5 mcg/min (08/08/23 1034)   [MAR Hold] penicillin  G potassium 12 Million Units in dextrose  5 % 500 mL CONTINUOUS infusion 12 Million Units (08/08/23 0530)   propofol  (DIPRIVAN ) infusion     propofol       Patient Profile   Melissa Bray is a 63 yo female with PMH of T2DM, HTN, HLD, breast cancer s/p left mastectomy.   Assessment/Plan   1. Acute Respiratory Distress due to Flash Pulmonary Edema: in the setting of septic mitral valve endocarditis, severe MR, and volume overload. Decompensated on the cath lab table, likely due to hypertension and high volume IVF administration prior to procedure. On RHC, PCWP only 11, however at time of data collection hypotensive. Suspect much higher when patient is HTN. Remains with copious pink frothy sputum coming up ETT. PAC for hemodynamic monitoring. Normal EF. Recheck swan numbers once more aware.  - now intubated. Vent mgmt per CCM - continue NE for BP support. Add VP - would dose with IV Lasix 80 mg this afternoon 2. Severe MR with leaflet perforation due to mitral valve endocarditis: in the setting of group B strep bacteremia. TEE performed 5/5 showing perforated mitral leaflet and severe MR (with mobile vegetation of both MV leaflets). TCTS planning for mitral surgery early next week, will re-assess timing with recent decompensation.  - PAC for hemodynamic monitoring - abx as above - diuresis as above 3. Group B strep bacteremia: Initially presented with severe sepsis thought to be related to LUE cellulitis. Blood cultures positive for strep agalactiae. Pre-surgical orthopanogram notable for more likely infectious source, dental infection with abscess. Now s/p dental extraction 5/8. - ID following; continue PCN G 4. HTN: Currently on pressors. Hold antihypertensives.  5. T2DM: A1C 12.8%. Diabetes Mgmt following.  Length of Stay: 6  [place] Lee, NP  08/08/2023, 10:41 AM  Advanced Heart Failure Team Pager [phone] (M-F; 7a - 5p)  Please contact [facility] for night-coverage after hours (4p -7a ) and weekends on [email or web address]  Patient seen with NP, I formulated the plan and agree with the above note.   Patient seen initially in the cath lab  today.  She has mitral valve endocarditis from Strep agalactiae with valve leaflet perforation and severe mitral regurgitation. In cath lab she became acutely short of breath when she laid down on the table, she was markedly hypertensive, and ABG showed severe hypercarbia/respiratory acidosis.  She became somnolent and was intubated.   LHC/RHC showed no coronary disease, Swan and IABP placed.  I assisted in cath lab with drips/meds while Dr. Swaziland did the procedure.   She is now on NE 17, vasopressin 0.04, IABP at 1:1.  Co-ox 60% but CI only 1.5.  There is some concern about accuracy of Swan measurements as PA port will not draw back. CVP 12-13.    She is hyperkalemic this evening with stable creatinine 1.07.  Lactate 2.5.   General: NAD Neck: JVP 10-12 cm, no thyromegaly or thyroid  nodule.  Lungs: Clear to auscultation bilaterally with normal respiratory effort. CV: Nondisplaced PMI.  IABP sounds.  No  peripheral edema.  No carotid bruit.  Normal pedal pulses.  Abdomen: Soft, nontender, no hepatosplenomegaly, no distention.  Skin: Intact without lesions or rashes.  Neurologic: Sedated on vent but will awaken, follow commands.  Extremities: No clubbing or cyanosis.  HEENT: Normal.   1. Cardiogenic shock: Valvular shock from severe MR.  TEE on 5/5 showed LV EF 65%, normal RV function, severe MR with perforated leaflet and mobile vegetation.  Patient developed flash pulmonary edema today in setting of marked hypertension.  She was intubated and now on NE 17, vasopressin 0.04.  IABP at 1:1.  Co-ox currently 60% though thermodilution CI is low (I am concerned about the Albers accuracy).  CVP 12-13.  - Lasix 80 mg IV x 1 now.  - Continue IABP 1:1.  - Wean pressors as able, not ideal in setting of severe MR.  - With low output and severe MR, will carefully start milrinone at 0.125 and will increase if MAP tolerates.   2. Mitral regurgitation: Severe on TEE with perforated anterior leaflet and mitral  valve vegetation.  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  - Continue PCN G - Management of shock from mitral regurgitation as above.  - Will need surgical repair of valve when stabilized.  3. Acute hypercarbic/hypoxemic respiratory failure.  - mechanical ventilation per CCM.  4. Hyperkalemia: K 6.4, getting Lokelma and Lasix.   CRITICAL CARE Performed by: Peder Bourdon  Total critical care time: 70 minutes  Critical care time was exclusive of separately billable procedures and treating other patients.  Critical care was necessary to treat or prevent imminent or life-threatening deterioration.  Critical care was time spent personally by me on the following activities: development of treatment plan with patient and/or surrogate as well as nursing, discussions with consultants, evaluation of patient's response to treatment, examination of patient, obtaining history from patient or surrogate, ordering and performing treatments and interventions, ordering and review of laboratory studies, ordering and review of radiographic studies, pulse oximetry and re-evaluation of patient's condition.   Peder Bourdon 08/08/2023 8:04 PM

## 2023-08-08 NOTE — Procedures (Signed)
 Arterial Catheter Insertion Procedure Note  Quetzalli Lafferty  [id number]  [birth year]  Date:08/08/23  Time:3:14 PM    Provider Performing: Casimiro Cleaves    Procedure: Insertion of Arterial Line (47829) with US  guidance (56213)   Indication(s) Blood pressure monitoring and/or need for frequent ABGs  Consent Unable to obtain consent due to emergent nature of procedure.  Anesthesia None   Time Out Verified patient identification, verified procedure, site/side was marked, verified correct patient position, special equipment/implants available, medications/allergies/relevant history reviewed, required imaging and test results available.   Sterile Technique Maximal sterile technique including full sterile barrier drape, hand hygiene, sterile gown, sterile gloves, mask, hair covering, sterile ultrasound probe cover (if used).   Procedure Description Area of catheter insertion was cleaned with chlorhexidine  and draped in sterile fashion. With real-time ultrasound guidance an arterial catheter was placed into the left femoral artery.  Appropriate arterial tracings confirmed on monitor.     Complications/Tolerance None; patient tolerated the procedure well.   EBL Minimal   Specimen(s) None  JD Carliss Chess  Pulmonary & Critical Care 08/08/2023, 3:14 PM  Please see [email or web address] for pager details.  From 7A-7P if no response, please call [phone]. After hours, please call ELink [phone].

## 2023-08-08 NOTE — Progress Notes (Signed)
 Brief Neuro Note:  [name] is admitted with L arm cellulitis, Group B strep bacteremia and mitral valve endocarditis/perforation. A code stroke was activated in the cath lab for obtunded mentation. However, noted to have elevated CO2 and pH of 6.9 on ABG. She is clearly not protecting her airway.  The noted abnormalities on ABG explain her obtunded mentation. She is non focal on exam. Pupils equal round and reactive, symmetric facial grimace to nares stimulation, no cough to nares stimulation. Some movement in BL uppers but no antigravity movements, no movements in BL lower extremities.  She is not a candidate for tnkase due to endocarditis. I would recommend getting a CT Head at some point since she is high risk for ICH and embolization.  Team wanted patient transferred to 2H, secure airway and stabilize her before getting CT Head which I think is reasonable.  Paris Hohn [facility]

## 2023-08-08 NOTE — Progress Notes (Addendum)
 Patient Name: Melissa Bray Date of Encounter: 08/08/2023 [facility] Cardiologist: Eilleen Grates, MD   Interval Summary  .    63 yr old female with PMH of type 2 DM, HTN, HLD, breat cancer s/p left mastectomy,who is admitted for left arm cellulitis, complicated by pan-sensitive group B strep bacteremia. Echo revealed moderate to severe MR. TEE showed LVEF 65-70%, severe MR with mobile vegetation noted on anterior mitral leaflet with perforation of the leaflet,  thickening and probably vegetation of the posterior leaflet as well. ID following, recommend penicillin  G. CTS consulted, recommend OR after dental extraction and cardiac cath.   Patient is planned for cardiac cath today. She states she had her teeth pulled yesterday, required intubation and IV induction, after that she become SOB and hypoxic, required [place] oxygen support, she was nervous about this. She no longer feel SOB today. She denied any chest pain, leg edema.    Vital Signs .    Vitals:   08/07/23 1900 08/07/23 2335 08/08/23 0505 08/08/23 0728  BP: 109/72 113/72 115/82 (!) 117/97  Pulse: 90 (!) 102 95 99  Resp: 20 20 20 18   Temp: 98 F (36.7 C) 97.7 F (36.5 C) 98.9 F (37.2 C) 98.5 F (36.9 C)  TempSrc: Oral Oral Oral Oral  SpO2: 95% 95% 97% 94%  Weight:      Height:        Intake/Output Summary (Last 24 hours) at 08/08/2023 0836 Last data filed at 08/08/2023 0321 Gross per 24 hour  Intake 3101.4 ml  Output 10 ml  Net 3091.4 ml      08/07/2023    1:45 PM 08/07/2023    3:11 AM 08/06/2023    3:40 AM  Last 3 Weights  Weight (lbs) 260 lb 260 lb 9.3 oz 259 lb 11.2 oz  Weight (kg) 117.935 kg 118.2 kg 117.8 kg      Telemetry/ECG    Sinus tachycardia 100s - Personally Reviewed  Physical Exam .   GEN: No acute distress.   Neck: No JVD Cardiac: RRR, holosystolic murmur radiating to axilla  Respiratory: Clear to auscultation bilaterally. On 2LNC, pox 98%, speaks full sentence, no dyspnea  GI: Soft,  nontender, non-distended  MS: No leg edema  Assessment & Plan .     Severe mitral regurgitation  Native mitral valve endocarditis with anterior leaflet perforation  Group B strep bacteremia  Left arm cellulitis  - presented with fever, chills, left arm swelling, malaise for 506 days, found to have Group B strep bacteremia, TTE and TEE revealing severe MR with mobile vegetation noted on anterior mitral leaflet with perforation of the leaflet,  thickening and probably vegetation of the posterior leaflet as well, now s/p dental extraction, planned for cardiac cath today before OR by CT surgery  - s/p dental extraction SOB and hypoxia yesterday required [facility], suspect due to anesthesia sedation as she was intubated, no signs of acute CHF on exam today, can wean off oxygen as tolerated, if signs of decompensated CHF occurs, can start loop diuretic and vasodilators for afterload reduction   HTN HLD Type 2 DM - Per hospitalist    For questions or updates, please contact [facility] Please consult [email or web address] for contact info under      Signed, Xika Zhao, NP  Note per NP.  She went to the cardiac cath lab and was seen before the procedure by Dr. [place].   While moving to the cath table she had acute respiratory distress.  See notes elsewhere.

## 2023-08-08 NOTE — Progress Notes (Signed)
 Patient with left upper extremity cellulitis and group B strep bacteremia, mitral valve endocarditis/perforation underwent cardiac catheterization this morning in preparation for mitral valve repair by CT surgery, initial code stroke activated in the Cath Lab as patient was obtunded however ABG done with a elevated CO2, pH of 6.9 and not protecting her airway and as such was intubated and transferred to the ICU.  Critical care consulted and took over as primary.  Nothing further to add at this time.  Will sign off.   No charge.

## 2023-08-08 NOTE — Procedures (Signed)
 Central Venous Catheter Insertion Procedure Note  Marylea Roblyer  [id number]  21-Jun-1960  Date:08/08/23  Time:1:46 PM   Provider Performing:Aundray Cartlidge D Marlys Singh   Procedure: Insertion of Non-tunneled Central Venous Catheter(36556) with US  guidance (40981)   Indication(s) Medication administration  Consent Unable to obtain consent due to emergent nature of procedure.  Anesthesia Topical only with 1% lidocaine    Timeout Verified patient identification, verified procedure, site/side was marked, verified correct patient position, special equipment/implants available, medications/allergies/relevant history reviewed, required imaging and test results available.  Sterile Technique Maximal sterile technique including full sterile barrier drape, hand hygiene, sterile gown, sterile gloves, mask, hair covering, sterile ultrasound probe cover (if used).  Procedure Description Area of catheter insertion was cleaned with chlorhexidine  and draped in sterile fashion.  With real-time ultrasound guidance a central venous catheter was placed into the left internal jugular vein. Nonpulsatile blood flow and easy flushing noted in all ports.  The catheter was sutured in place and sterile dressing applied.  Complications/Tolerance None; patient tolerated the procedure well. Chest X-ray is ordered to verify placement for internal jugular or subclavian cannulation.   Chest x-ray is not ordered for femoral cannulation.  EBL Minimal  Specimen(s) None  JD Carliss Chess [facility] 08/08/2023, 1:46 PM  Please see [email or web address] for pager details.  From 7A-7P if no response, please call [phone]. After hours, please call ELink [phone].

## 2023-08-09 ENCOUNTER — Encounter (HOSPITAL_COMMUNITY): Payer: Self-pay | Admitting: Cardiology

## 2023-08-09 ENCOUNTER — Inpatient Hospital Stay (HOSPITAL_COMMUNITY)

## 2023-08-09 ENCOUNTER — Other Ambulatory Visit (HOSPITAL_COMMUNITY)

## 2023-08-09 DIAGNOSIS — I059 Rheumatic mitral valve disease, unspecified: Secondary | ICD-10-CM | POA: Diagnosis not present

## 2023-08-09 DIAGNOSIS — G9340 Encephalopathy, unspecified: Secondary | ICD-10-CM | POA: Diagnosis not present

## 2023-08-09 DIAGNOSIS — D72829 Elevated white blood cell count, unspecified: Secondary | ICD-10-CM

## 2023-08-09 DIAGNOSIS — J811 Chronic pulmonary edema: Secondary | ICD-10-CM

## 2023-08-09 DIAGNOSIS — J9601 Acute respiratory failure with hypoxia: Secondary | ICD-10-CM

## 2023-08-09 DIAGNOSIS — R570 Cardiogenic shock: Secondary | ICD-10-CM | POA: Diagnosis not present

## 2023-08-09 DIAGNOSIS — I340 Nonrheumatic mitral (valve) insufficiency: Secondary | ICD-10-CM | POA: Diagnosis not present

## 2023-08-09 DIAGNOSIS — J810 Acute pulmonary edema: Secondary | ICD-10-CM | POA: Diagnosis not present

## 2023-08-09 DIAGNOSIS — J9602 Acute respiratory failure with hypercapnia: Secondary | ICD-10-CM | POA: Diagnosis not present

## 2023-08-09 LAB — BASIC METABOLIC PANEL WITH GFR
Anion gap: 12 (ref 5–15)
Anion gap: 12 (ref 5–15)
Anion gap: 11 (ref 5–15)
BUN: 13 mg/dL (ref 8–23)
BUN: 13 mg/dL (ref 8–23)
BUN: 12 mg/dL (ref 8–23)
CO2: 25 mmol/L (ref 22–32)
CO2: 24 mmol/L (ref 22–32)
CO2: 24 mmol/L (ref 22–32)
Calcium: 9.3 mg/dL (ref 8.9–10.3)
Calcium: 8.9 mg/dL (ref 8.9–10.3)
Calcium: 8.3 mg/dL — ABNORMAL LOW (ref 8.9–10.3)
Chloride: 100 mmol/L (ref 98–111)
Chloride: 98 mmol/L (ref 98–111)
Chloride: 100 mmol/L (ref 98–111)
Creatinine, Ser: 1.11 mg/dL — ABNORMAL HIGH (ref 0.44–1.00)
Creatinine, Ser: 1.01 mg/dL — ABNORMAL HIGH (ref 0.44–1.00)
Creatinine, Ser: 0.97 mg/dL (ref 0.44–1.00)
GFR, Estimated: 56 mL/min — ABNORMAL LOW (ref >60)
GFR, Estimated: >60 mL/min (ref >60)
GFR, Estimated: >60 mL/min (ref >60)
Glucose, Bld: 154 mg/dL — ABNORMAL HIGH (ref 70–99)
Glucose, Bld: 287 mg/dL — ABNORMAL HIGH (ref 70–99)
Glucose, Bld: 262 mg/dL — ABNORMAL HIGH (ref 70–99)
Potassium: 3.9 mmol/L (ref 3.5–5.1)
Potassium: 4.5 mmol/L (ref 3.5–5.1)
Potassium: 3.7 mmol/L (ref 3.5–5.1)
Sodium: 137 mmol/L (ref 135–145)
Sodium: 134 mmol/L — ABNORMAL LOW (ref 135–145)
Sodium: 135 mmol/L (ref 135–145)

## 2023-08-09 LAB — POCT I-STAT 7, (LYTES, BLD GAS, ICA,H+H)
Acid-Base Excess: 3 mmol/L — ABNORMAL HIGH (ref 0.0–2.0)
Acid-Base Excess: 3 mmol/L — ABNORMAL HIGH (ref 0.0–2.0)
Bicarbonate: 26.1 mmol/L (ref 20.0–28.0)
Bicarbonate: 26.9 mmol/L (ref 20.0–28.0)
Calcium, Ion: 1.19 mmol/L (ref 1.15–1.40)
Calcium, Ion: 1.28 mmol/L (ref 1.15–1.40)
HCT: 30 % — ABNORMAL LOW (ref 36.0–46.0)
HCT: 37 % (ref 36.0–46.0)
Hemoglobin: 10.2 g/dL — ABNORMAL LOW (ref 12.0–15.0)
Hemoglobin: 12.6 g/dL (ref 12.0–15.0)
O2 Saturation: 100 %
O2 Saturation: 98 %
Patient temperature: 37.1
Patient temperature: 37.2
Potassium: 3.6 mmol/L (ref 3.5–5.1)
Potassium: 3.8 mmol/L (ref 3.5–5.1)
Sodium: 135 mmol/L (ref 135–145)
Sodium: 137 mmol/L (ref 135–145)
TCO2: 27 mmol/L (ref 22–32)
TCO2: 28 mmol/L (ref 22–32)
pCO2 arterial: 35 mmHg (ref 32–48)
pCO2 arterial: 39.1 mmHg (ref 32–48)
pH, Arterial: 7.446 (ref 7.35–7.45)
pH, Arterial: 7.481 — ABNORMAL HIGH (ref 7.35–7.45)
pO2, Arterial: 102 mmHg (ref 83–108)
pO2, Arterial: 167 mmHg — ABNORMAL HIGH (ref 83–108)

## 2023-08-09 LAB — GLUCOSE, CAPILLARY
Glucose-Capillary: 116 mg/dL — ABNORMAL HIGH (ref 70–99)
Glucose-Capillary: 151 mg/dL — ABNORMAL HIGH (ref 70–99)
Glucose-Capillary: 155 mg/dL — ABNORMAL HIGH (ref 70–99)
Glucose-Capillary: 164 mg/dL — ABNORMAL HIGH (ref 70–99)
Glucose-Capillary: 170 mg/dL — ABNORMAL HIGH (ref 70–99)
Glucose-Capillary: 214 mg/dL — ABNORMAL HIGH (ref 70–99)
Glucose-Capillary: 227 mg/dL — ABNORMAL HIGH (ref 70–99)
Glucose-Capillary: 232 mg/dL — ABNORMAL HIGH (ref 70–99)
Glucose-Capillary: 250 mg/dL — ABNORMAL HIGH (ref 70–99)
Glucose-Capillary: 264 mg/dL — ABNORMAL HIGH (ref 70–99)
Glucose-Capillary: 96 mg/dL (ref 70–99)

## 2023-08-09 LAB — COOXEMETRY PANEL
Carboxyhemoglobin: 0.8 % (ref 0.5–1.5)
Methemoglobin: <0.7 % (ref 0.0–1.5)
O2 Saturation: 66.5 %
Total hemoglobin: 12.4 g/dL (ref 12.0–16.0)

## 2023-08-09 LAB — CBC
HCT: 38.6 % (ref 36.0–46.0)
Hemoglobin: 12.8 g/dL (ref 12.0–15.0)
MCH: 28.3 pg (ref 26.0–34.0)
MCHC: 33.2 g/dL (ref 30.0–36.0)
MCV: 85.2 fL (ref 80.0–100.0)
Platelets: 302 10*3/uL (ref 150–400)
RBC: 4.53 MIL/uL (ref 3.87–5.11)
RDW: 13.2 % (ref 11.5–15.5)
WBC: 16 10*3/uL — ABNORMAL HIGH (ref 4.0–10.5)
nRBC: 0 % (ref 0.0–0.2)

## 2023-08-09 LAB — HEPARIN LEVEL (UNFRACTIONATED)
Heparin Unfractionated: 0.1 [IU]/mL — ABNORMAL LOW (ref 0.30–0.70)
Heparin Unfractionated: 0.15 [IU]/mL — ABNORMAL LOW (ref 0.30–0.70)
Heparin Unfractionated: 0.32 [IU]/mL (ref 0.30–0.70)

## 2023-08-09 LAB — PHOSPHORUS: Phosphorus: 3.1 mg/dL (ref 2.5–4.6)

## 2023-08-09 LAB — MRSA NEXT GEN BY PCR, NASAL: MRSA by PCR Next Gen: NOT DETECTED

## 2023-08-09 LAB — MAGNESIUM: Magnesium: 1.6 mg/dL — ABNORMAL LOW (ref 1.7–2.4)

## 2023-08-09 LAB — CG4 I-STAT (LACTIC ACID): Lactic Acid, Venous: 1.4 mmol/L (ref 0.5–1.9)

## 2023-08-09 MED ORDER — INSULIN ASPART 100 UNIT/ML IJ SOLN
0.0000 [IU] | INTRAMUSCULAR | Status: DC
  Administered 2023-08-09 (×4): 5 [IU] via SUBCUTANEOUS
  Administered 2023-08-09: 8 [IU] via SUBCUTANEOUS
  Administered 2023-08-09: 3 [IU] via SUBCUTANEOUS
  Administered 2023-08-10: 8 [IU] via SUBCUTANEOUS
  Administered 2023-08-10: 5 [IU] via SUBCUTANEOUS

## 2023-08-09 MED ORDER — INSULIN ASPART 100 UNIT/ML IJ SOLN
10.0000 [IU] | Freq: Four times a day (QID) | INTRAMUSCULAR | Status: DC
  Administered 2023-08-09: 10 [IU] via SUBCUTANEOUS

## 2023-08-09 MED ORDER — THIAMINE MONONITRATE 100 MG PO TABS
100.0000 mg | ORAL_TABLET | Freq: Every day | ORAL | Status: DC
  Administered 2023-08-09 – 2023-08-11 (×3): 100 mg
  Filled 2023-08-09 (×3): qty 1

## 2023-08-09 MED ORDER — VANCOMYCIN HCL 1.25 G IV SOLR: 1250.0000 mg | INTRAVENOUS | Status: DC

## 2023-08-09 MED ORDER — SODIUM CHLORIDE 0.9 % IV SOLN: 250.0000 mL | INTRAVENOUS | Status: AC | PRN

## 2023-08-09 MED ORDER — SODIUM CHLORIDE 0.9% FLUSH
3.0000 mL | Freq: Two times a day (BID) | INTRAVENOUS | Status: DC
  Administered 2023-08-09 – 2023-08-10 (×5): 3 mL via INTRAVENOUS
  Administered 2023-08-10: 30 mL via INTRAVENOUS
  Administered 2023-08-11 – 2023-08-13 (×5): 3 mL via INTRAVENOUS

## 2023-08-09 MED ORDER — INSULIN GLARGINE-YFGN 100 UNIT/ML ~~LOC~~ SOLN
40.0000 [IU] | SUBCUTANEOUS | Status: DC
  Filled 2023-08-09: qty 0.4

## 2023-08-09 MED ORDER — MAGNESIUM SULFATE 2 GM/50ML IV SOLN
2.0000 g | Freq: Once | INTRAVENOUS | Status: AC
  Administered 2023-08-09: 2 g via INTRAVENOUS
  Filled 2023-08-09: qty 50

## 2023-08-09 MED ORDER — PROSOURCE TF20 ENFIT COMPATIBL EN LIQD
60.0000 mL | Freq: Every day | ENTERAL | Status: DC
Start: 2023-08-09 — End: 2023-08-09
  Filled 2023-08-09: qty 60

## 2023-08-09 MED ORDER — SODIUM CHLORIDE 0.9% FLUSH: 3.0000 mL | INTRAVENOUS | Status: DC | PRN

## 2023-08-09 MED ORDER — INSULIN ASPART 100 UNIT/ML IJ SOLN: 4.0000 [IU] | Freq: Three times a day (TID) | INTRAMUSCULAR | Status: DC

## 2023-08-09 MED ORDER — PIPERACILLIN-TAZOBACTAM 3.375 G IVPB
3.3750 g | Freq: Three times a day (TID) | INTRAVENOUS | Status: DC
  Administered 2023-08-09 – 2023-08-11 (×6): 3.375 g via INTRAVENOUS
  Filled 2023-08-09 (×6): qty 50

## 2023-08-09 MED ORDER — VITAL HIGH PROTEIN PO LIQD: 1000.0000 mL | ORAL | Status: DC

## 2023-08-09 MED ORDER — INSULIN GLARGINE-YFGN 100 UNIT/ML ~~LOC~~ SOLN
10.0000 [IU] | Freq: Two times a day (BID) | SUBCUTANEOUS | Status: DC
  Administered 2023-08-09 (×2): 10 [IU] via SUBCUTANEOUS
  Filled 2023-08-09 (×4): qty 0.1

## 2023-08-09 MED ORDER — VITAL AF 1.2 CAL PO LIQD
1000.0000 mL | ORAL | Status: DC
  Administered 2023-08-09 – 2023-08-10 (×2): 1000 mL

## 2023-08-09 MED ORDER — VANCOMYCIN HCL 10 G IV SOLR
2500.0000 mg | Freq: Once | INTRAVENOUS | Status: AC
  Administered 2023-08-09: 2500 mg via INTRAVENOUS
  Filled 2023-08-09: qty 2500

## 2023-08-09 MED ORDER — POTASSIUM CHLORIDE 20 MEQ PO PACK
40.0000 meq | PACK | Freq: Once | ORAL | Status: AC
  Administered 2023-08-09: 40 meq
  Filled 2023-08-09: qty 2

## 2023-08-09 MED ORDER — INSULIN ASPART 100 UNIT/ML IJ SOLN
3.0000 [IU] | INTRAMUSCULAR | Status: DC
  Administered 2023-08-09 – 2023-08-11 (×10): 3 [IU] via SUBCUTANEOUS

## 2023-08-09 MED ORDER — PROSOURCE TF20 ENFIT COMPATIBL EN LIQD
60.0000 mL | ENTERAL | Status: DC
Start: 2023-08-09 — End: 2023-08-11
  Administered 2023-08-09 – 2023-08-11 (×11): 60 mL
  Filled 2023-08-09 (×10): qty 60

## 2023-08-09 MED ORDER — INSULIN ASPART 100 UNIT/ML IJ SOLN: 0.0000 [IU] | Freq: Three times a day (TID) | INTRAMUSCULAR | Status: DC

## 2023-08-09 MED ORDER — FUROSEMIDE 10 MG/ML IJ SOLN
80.0000 mg | Freq: Two times a day (BID) | INTRAMUSCULAR | Status: AC
  Administered 2023-08-09 (×2): 80 mg via INTRAVENOUS
  Filled 2023-08-09 (×2): qty 8

## 2023-08-09 MED ORDER — MAGNESIUM SULFATE 2 GM/50ML IV SOLN: 2.0000 g | Freq: Once | INTRAVENOUS | Status: DC

## 2023-08-09 MED ORDER — INSULIN ASPART 100 UNIT/ML IJ SOLN: 0.0000 [IU] | Freq: Every day | INTRAMUSCULAR | Status: DC

## 2023-08-09 MED ORDER — INSULIN ASPART 100 UNIT/ML IJ SOLN
3.0000 [IU] | INTRAMUSCULAR | Status: DC
  Administered 2023-08-09: 3 [IU] via SUBCUTANEOUS

## 2023-08-09 NOTE — Progress Notes (Signed)
 eLink Physician-Brief Progress Note Patient Name: Melissa Bray DOB: Aug 01, 1960 MRN: [id number]   Date of Service  08/09/2023  HPI/Events of Note  Anion gap closed.  eICU Interventions  Patient Insulin  gtt transition orders entered.        [name] U Karin Griffith 08/09/2023, 3:31 AM

## 2023-08-09 NOTE — Progress Notes (Signed)
 NAME:  Melissa Bray, MRN:  [id number], DOB:  May 26, 1960, LOS: 7 ADMISSION DATE:  07/31/2023, CONSULTATION DATE:  5/9 REFERRING MD:  Mitzie Anda, CHIEF COMPLAINT:  respiratory failure   History of Present Illness:  Melissa Bray is a 63 y/o woman with a history of DM, HTN who presented on 5/1 with cellulitis of her LUE and sepsis. At presentation she complained of fevers, generalized weakness, and reduced PO intake. During her evaluation she was diagnosed with GBS bacteremia and mitral valve endocarditis with leaflet perforation. She required dental extractions for dental carries. Plans have been in place for mitral valve repair by TCTS. She was planned for [facility] today; after coming to the cath lab and going through timeout procedure she was moved to the cath lab table. She very quickly got distressed lying back, trying to sit up. She was placed on bipap for respiratory distress. She suddenly went unresponsive during this time without ongoing agitation. No sedation was given. PCCM was consulted in cath lab for emergent management of respiratory failure.    Pertinent  Medical History  MD HTN HLD Breast cancer, s/p left mastectomy  Significant Hospital Events: Including procedures, antibiotic start and stop dates in addition to other pertinent events   5/1 Admitted  5/9 deteriorated due to flash pulmonary edema, intubated, IABP.  Interim History / Subjective:  Melissa Bray denies pain. No acute events overnight.  Objective    Blood pressure (!) 98/55, pulse 78, temperature 98.4 F (36.9 C), resp. rate (!) 28, height 5\' 5"  (1.651 m), weight 123.8 kg, SpO2 100%. PAP: (24-45)/(18-40) 31/21 CVP:  [0 mmHg-13 mmHg] 7 mmHg CO:  [2.9 L/min-4.1 L/min] 4.1 L/min CI:  [1.29 L/min/m2-1.86 L/min/m2] 1.83 L/min/m2  Vent Mode: PRVC FiO2 (%):  [60 %-100 %] 60 % Set Rate:  [10 bmp-35 bmp] 28 bmp Vt Set:  [450 mL] 450 mL PEEP:  [5 cmH20-12 cmH20] 12 cmH20 Plateau Pressure:  [16 cmH20-28 cmH20] 27 cmH20    Intake/Output Summary (Last 24 hours) at 08/09/2023 0750 Last data filed at 08/09/2023 0700 Gross per 24 hour  Intake 2851 ml  Output 1172 ml  Net 1679 ml   Filed Weights   08/07/23 0311 08/07/23 1345 08/09/23 0454  Weight: 118.2 kg 117.9 kg 123.8 kg    Examination: General: Critically ill-appearing woman lying in bed no acute distress, intubated, lightly sedated HENT: [place]/AT, eyes anicteric Lungs: Breathing comfortably on mechanical ventilation, rales improved since yesterday.  No wheezing. Cardiovascular: S1-S2, regular rate and rhythm, unable auscultate apical murmur over balloon pump click Abdomen:  obese, soft, nontender Extremities: No C-peripheral edema Neuro: Not opening eyes but nodding to answer questions and following commands     7.48/35/102/37 Coox 66% BUN 13 Cr 1.01 WBC 16 Blood cultures 5/9: NGTD CXR personally reviewed> ETT 2.5 cm above carina, bilateral lower lobe opacities with few air bronchograms, LUL mostly spared.  Still has significant pulm edema. BG in 200    Resolved Hospital Problem list :    Assessment & Plan:  Acute respiratory failure with hypoxia and hypercapnia Acute pulmonary edema; flashed  due to orthopnea and hypertension -LTV - VAP prevention protocol - Additional diuresis - PAD protocol for sedation - Daily SAT and SBT as appropriate.  Needs more volume off -Antibiotics broadened to cover for possible aspiration pneumonia -trach aspirate culture  MR due to MV endocarditis Acute HFpEF due to severe MR -Appreciate ID's management -Appreciate advanced heart failure team's management> balloon pump and inotropes  -Continue heparin for balloon pump -  Additional lasix -Needs to be more compensated before surgery, timing of surgery TBD  Acute encephalopathy likely due to hypercapnia, exam nonfocal. With left sided endocarditis, need to r/o embolic stroke.  Seems resolved - PAD protocol  Hyponatremia -Diuresis, avoid hypotonic  fluids  Hyperglycemia, DM - Continue glargine 10 units BID -Add tube feeding coverage 3 units every 4 hours -SSI PRN -goal BG   Anemia -transfuse for Hb <7 or hemodynamically significant bleeding -monitor  Hyperkalemia, resolved -monitor -may need additional lokelma  Hypomagnesemia -repleted, monitor  Best Practice (right click and "Reselect all SmartList Selections" daily)   Diet/type: tubefeeds DVT prophylaxis systemic heparin Pressure ulcer(s): N/A GI prophylaxis: PPI Lines: Central line, Aline Foley:  Yes, and it is still needed Code Status:  full code Last date of multidisciplinary goals of care discussion [family updated at bedside PM on 5/9]  Labs   CBC: Recent Labs  Lab 08/04/23 0509 08/06/23 1046 08/07/23 0421 08/08/23 0322 08/08/23 0956 08/08/23 1126 08/08/23 1128 08/08/23 1634 08/09/23 0038 08/09/23 0250  WBC 4.8 6.6 5.7 6.0  --   --   --   --   --  16.0*  HGB 12.1 11.7* 10.9* 11.7*   < > 14.6  14.3 14.3 13.6 12.6 12.8  HCT 36.9 35.0* 33.3* 36.1   < > 43.0  42.0 42.0 40.0 37.0 38.6  MCV 85.4 84.1 85.8 86.8  --   --   --   --   --  85.2  PLT 198 235 218 241  --   --   --   --   --  302   < > = values in this interval not displayed.    Basic Metabolic Panel: Recent Labs  Lab 08/02/23 0819 08/04/23 0509 08/05/23 0419 08/06/23 1046 08/07/23 0421 08/08/23 0322 08/08/23 8469 08/08/23 1405 08/08/23 1634 08/08/23 1637 08/08/23 2252 08/09/23 0038 08/09/23 0250  NA 134*   < >  --  134* 135 133*   < > 133* 132* 134* 137 137 137  K 3.8   < >  --  4.2 4.2 4.4   < > 6.8* 6.3* 6.4* 3.9 3.8 3.9  CL 99   < >  --  100 102 101  --  97*  --  97* 101  --  100  CO2 24   < >  --  25 26 23   --  23  --  20* 25  --  25  GLUCOSE 211*   < >  --  236* 178* 205*  --  468*  --  456* 183*  --  154*  BUN 11   < >  --  8 7* 7*  --  11  --  12 13  --  13  CREATININE 0.73   < >  --  0.67 0.71 0.61  --  1.13*  --  1.17* 1.15*  --  1.11*  CALCIUM 8.6*   < >  --  9.2  8.9 8.9  --  8.4*  --  8.3* 9.2  --  9.3  MG 1.8  --  1.5* 1.7 2.1  --   --   --   --   --   --   --  1.6*   < > = values in this interval not displayed.   GFR: Estimated Creatinine Clearance: 67.7 mL/min (A) (by C-G formula based on SCr of 1.11 mg/dL (H)). Recent Labs  Lab 08/06/23 1046 08/07/23 0421 08/08/23 0322 08/08/23 1120 08/08/23  1836 08/08/23 2118 08/09/23 0250  WBC 6.6 5.7 6.0  --   --   --  16.0*  LATICACIDVEN  --   --   --  2.1* 2.5* 2.6*  --      Critical care time:     This patient is critically ill with multiple organ system failure which requires frequent high complexity decision making, assessment, support, evaluation, and titration of therapies. This was completed through the application of advanced monitoring technologies and extensive interpretation of multiple databases. During this encounter critical care time was devoted to patient care services described in this note for 41 minutes.    Joesph Mussel, DO 08/09/23 8:19 PM [facility]  For contact information, see Amion. If no response to pager, please call PCCM consult pager. After hours, 7PM- 7AM, please call Elink.

## 2023-08-09 NOTE — Progress Notes (Signed)
 Initial Nutrition Assessment  DOCUMENTATION CODES:   Obesity unspecified  INTERVENTION:   Recommend considering Cortrak placement  Tube Feeding via OG: cautious initiation of TF given diastolic pressures <50 Vital AF 1.2 at 20 ml/hr TF goal: Vital AF 1.2 at 55 ml/hr with Pro-Source TF20 60 mL BID TF at goal provides 1744 kcals, 139 g of protein and 1108 mL of free water  Pro-Source TF20 60 mL q 4 hours while on trickle TF, each packet provides 20 grams of protein and 80 kcals.   +constipation (last BM 5/04); if no BM post initiation of TF, recommend adjusting bowel regimen  Last Phosphorus checked 5/01 (3.1 wdl); recommended checking as add-on to today's AM labs with supplementation, if low, prior to TF initiation  Add Thiamine 100 mg daily per tube x 7 days  NUTRITION DIAGNOSIS:   Inadequate oral intake related to acute illness as evidenced by NPO status.  GOAL:   Patient will meet greater than or equal to 90% of their needs  MONITOR:   Vent status, Labs, Weight trends, TF tolerance  REASON FOR ASSESSMENT:   Consult, Ventilator Enteral/tube feeding initiation and management  ASSESSMENT:   63 yo female admitted with sepsis with cellulitis of LUE, diagnosed with GBS bacteremia and MV endocarditis with consult to TCTS, developed flash pulmonary edema and shock requiring intubation and IABP. PMH includes HTN, HLD, breast cancer s/p left mastectomy, DM, GERD  5/01 Admitted 5/05 TEE: LV EF 65%, normal RV, severe MR with perforated leaflet and mobile vegetation 5/08 Dental Extraction of teeth 11, 14, 30 5/09 Flash pulmonary edema, Intubated, LHC with IABP placed, Code Stroke  Pt remains on vent support, IABP 1:1 Noted TCTS following with tentative plans for surgery next week  OG tube enters stomach per chest xray report  Levophed at 10 Vasopressin 0.04 Milrinone 0.125  MAP (a-line x 2): 80-90s Diastolic Pressures: 40s to 50s (goal >50)  Lactic Acid 1.4   Last  BM 5/04, +constipation. +flatus. Noted current bowel regimen started yesterday  Current wt :123.8 kg (5/10) Admit wt: 120.7 kg (5/01) Lowest wt: 117.6 kg (5/06)  Dry weight unknown at present. Mild generalized edema with 3+edema in LUE UOP 1.172 L in 24 hours, 1.082 L thus far today  Unable to obtain diet and weight history from patient at this time. Limited documentation of po intake since admission with last recorded meal on 5/04; recorded po 75-100% but unclear how well pt has been eating over the past week  Labs: CBGs 116-214 (goal 140-180) BUN wdl Creatinine 1.11 Sodium 137 (wdl) Potassium 3.9 (wdl) Magnesium  1.6 (wdl) Phosphorus 3.1 (wdl but this value is from 5/01)  Meds:  Lasix SS Novolog  (moderate) Semglee  10 units BID Miralax daily and Colace BID (started 5/09) Mag sulfate   Diet Order:   Diet Order             Diet NPO time specified  Diet effective now                   EDUCATION NEEDS:   Not appropriate for education at this time  Skin:  Skin Assessment: Reviewed RN Assessment  Last BM:  5/04  Height:   Ht Readings from Last 1 Encounters:  08/07/23 5\' 5"  (1.651 m)    Weight:   Wt Readings from Last 1 Encounters:  08/09/23 123.8 kg    BMI:  Body mass index is 45.42 kg/m.  Estimated Nutritional Needs:   Kcal:  1600-1800 kcals  Protein:  120-140 g  Fluid:  1.6-1.8 L   Norvel Beer MS, RDN, LDN, CNSC Registered Dietitian 3 Clinical Nutrition RD Inpatient Contact Info in Amion

## 2023-08-09 NOTE — Progress Notes (Signed)
 PHARMACY - ANTICOAGULATION CONSULT NOTE  Pharmacy Consult for heparin Indication: IABP  Labs: Recent Labs    08/07/23 0421 08/08/23 0322 08/08/23 0956 08/08/23 1405 08/08/23 1634 08/08/23 1637 08/08/23 2118 08/08/23 2252 08/09/23 0038 08/09/23 0250  HGB 10.9* 11.7*   < >  --  13.6  --   --   --  12.6 12.8  HCT 33.3* 36.1   < >  --  40.0  --   --   --  37.0 38.6  PLT 218 241  --   --   --   --   --   --   --  302  HEPARINUNFRC  --   --   --   --   --   --  0.10*  --   --  0.10*  CREATININE 0.71 0.61  --  1.13*  --  1.17*  --  1.15*  --   --    < > = values in this interval not displayed.   Assessment: 63yo female remains subtherapeutic on heparin with no change in level after rate increase; no infusion issues or signs of bleeding per RN.  Goal of Therapy:  Heparin level 0.2-0.5 units/ml   Plan:  Increase heparin infusion by 2-3 units/kgABW/hr to 1200 units/hr. Check level in 6 hours.   Lonnie Roberts, PharmD, BCPS 08/09/2023 3:29 AM

## 2023-08-09 NOTE — Progress Notes (Signed)
 PHARMACY - ANTICOAGULATION CONSULT NOTE  Pharmacy Consult for heparin Indication: IABP  No Known Allergies  Patient Measurements: Height: 5\' 5"  (165.1 cm) Weight: 123.8 kg (272 lb 14.9 oz) IBW/kg (Calculated) : 57 HEPARIN DW (KG): 85.3  Vital Signs: Temp: 98.8 F (37.1 C) (05/10 1106) Pulse Rate: 273 (05/10 1106)  Labs: Recent Labs    08/07/23 0421 08/08/23 0322 08/08/23 0956 08/08/23 1634 08/08/23 1637 08/08/23 2118 08/08/23 2252 08/09/23 0038 08/09/23 0250 08/09/23 1015  HGB 10.9* 11.7*   < > 13.6  --   --   --  12.6 12.8  --   HCT 33.3* 36.1   < > 40.0  --   --   --  37.0 38.6  --   PLT 218 241  --   --   --   --   --   --  302  --   HEPARINUNFRC  --   --   --   --   --  0.10*  --   --  0.10* 0.15*  CREATININE 0.71 0.61   < >  --    < >  --  1.15*  --  1.11* 1.01*   < > = values in this interval not displayed.    Estimated Creatinine Clearance: 74.4 mL/min (A) (by C-G formula based on SCr of 1.01 mg/dL (H)).   Medical History: Past Medical History:  Diagnosis Date   Anxiety    Breast cancer (HCC)    Diabetes mellitus without complication (HCC)    Hyperlipidemia    Hypertension    Type 2 diabetes mellitus without retinopathy (HCC) 06/17/2017    Medications:  Scheduled:   anastrozole   1 mg Per Tube Daily   Chlorhexidine  Gluconate Cloth  6 each Topical Daily   docusate  100 mg Per Tube BID   feeding supplement (PROSource TF20)  60 mL Per Tube Daily   fentaNYL  (SUBLIMAZE ) injection  50 mcg Intravenous Once   furosemide  80 mg Intravenous BID   insulin  aspart  0-15 Units Subcutaneous Q4H   insulin  glargine-yfgn  10 Units Subcutaneous BID   mouth rinse  15 mL Mouth Rinse Q2H   pantoprazole (PROTONIX) IV  40 mg Intravenous Daily   polyethylene glycol  17 g Per Tube Daily   pravastatin   20 mg Per Tube Daily   sodium chloride  flush  3 mL Intravenous Q12H    Assessment: 62 yof presenting with acute mitral valve endocarditis with perforated mitral  leaflet and severe MR. Underwent dental extraction 5/8. Presented for pre-op RHC and developed flash pulmonary edema requiring intubation - IABP was placed in lab. No AC PTA. Start heparin for IABP.  Heparin level remains subtherapeutic at 0.15, no bleeding concerns noted by RN.  Goal of Therapy:  Heparin level 0.2-0.5 units/ml Monitor platelets by anticoagulation protocol: Yes   Plan:  Increase heparin to 1400 units/h Repeat HL in 6h  Levin Reamer, PharmD, [place], [facility] Clinical Pharmacist [phone] Please check AMION for all [facility] Pharmacy numbers 08/09/2023

## 2023-08-09 NOTE — Progress Notes (Signed)
 Patient ID: Melissa Bray, female   DOB: 17-Apr-1960, 63 y.o.   MRN: [id number]     Advanced Heart Failure Rounding Note  Cardiologist: Eilleen Grates, MD  Chief Complaint: CHF Subjective:    Patient is now on NE 10, vasopressin 0.04, milrinone 0.125 with IABP 1:1.   I/Os +1721.  Creatinine stable 1.11.   Swan: CVP 9 PA 31/21 CI 1.83 Co-ox 66.5%   Objective:   Weight Range: 123.8 kg Body mass index is 45.42 kg/m.   Vital Signs:   Temp:  [98.2 F (36.8 C)-99.1 F (37.3 C)] 98.4 F (36.9 C) (05/10 0700) Pulse Rate:  [0-121] 78 (05/10 0700) Resp:  [12-35] 28 (05/10 0700) BP: (67-250)/(31-158) 98/55 (05/09 1445) SpO2:  [88 %-100 %] 100 % (05/10 0700) Arterial Line BP: (81-316)/(34-134) 117/46 (05/10 0700) FiO2 (%):  [60 %-100 %] 60 % (05/10 0400) Weight:  [123.8 kg] 123.8 kg (05/10 0454) Last BM Date : 08/03/23  Weight change: Filed Weights   08/07/23 0311 08/07/23 1345 08/09/23 0454  Weight: 118.2 kg 117.9 kg 123.8 kg    Intake/Output:   Intake/Output Summary (Last 24 hours) at 08/09/2023 0710 Last data filed at 08/09/2023 0700 Gross per 24 hour  Intake 2851 ml  Output 1172 ml  Net 1679 ml      Physical Exam    General:  Intubated.  HEENT: Normal Neck: Supple. JVP 10. Carotids 2+ bilat; no bruits. No lymphadenopathy or thyromegaly appreciated. Cor: PMI nondisplaced. Regular rate & rhythm. IABP sounds, 2/6 HSM apex.  Lungs: Clear Abdomen: Soft, nontender, nondistended. No hepatosplenomegaly. No bruits or masses. Good bowel sounds. Extremities: No cyanosis, clubbing, rash, edema Neuro: Sedated this morning but will awaken.    Telemetry   NSR 70s (personally reviewed)   Labs    CBC Recent Labs    08/08/23 0322 08/08/23 0956 08/09/23 0038 08/09/23 0250  WBC 6.0  --   --  16.0*  HGB 11.7*   < > 12.6 12.8  HCT 36.1   < > 37.0 38.6  MCV 86.8  --   --  85.2  PLT 241  --   --  302   < > = values in this interval not displayed.   Basic Metabolic  Panel Recent Labs    08/07/23 0421 08/08/23 0322 08/08/23 2252 08/09/23 0038 08/09/23 0250  NA 135   < > 137 137 137  K 4.2   < > 3.9 3.8 3.9  CL 102   < > 101  --  100  CO2 26   < > 25  --  25  GLUCOSE 178*   < > 183*  --  154*  BUN 7*   < > 13  --  13  CREATININE 0.71   < > 1.15*  --  1.11*  CALCIUM 8.9   < > 9.2  --  9.3  MG 2.1  --   --   --  1.6*   < > = values in this interval not displayed.   Liver Function Tests No results for input(s): "AST", "ALT", "ALKPHOS", "BILITOT", "PROT", "ALBUMIN " in the last 72 hours. No results for input(s): "LIPASE", "AMYLASE" in the last 72 hours. Cardiac Enzymes No results for input(s): "CKTOTAL", "CKMB", "CKMBINDEX", "TROPONINI" in the last 72 hours.  BNP: BNP (last 3 results) No results for input(s): "BNP" in the last 8760 hours.  ProBNP (last 3 results) No results for input(s): "PROBNP" in the last 8760 hours.   D-Dimer No results for input(s): "  DDIMER" in the last 72 hours. Hemoglobin A1C No results for input(s): "HGBA1C" in the last 72 hours. Fasting Lipid Panel Recent Labs    08/07/23 0421  CHOL 95  HDL 27*  LDLCALC 58  TRIG 52  CHOLHDL 3.5   Thyroid  Function Tests No results for input(s): "TSH", "T4TOTAL", "T3FREE", "THYROIDAB" in the last 72 hours.  Invalid input(s): "FREET3"  Other results:   Imaging    Port CXR Result Date: 08/08/2023 CLINICAL DATA:  Central line placement. EXAM: PORTABLE CHEST 1 VIEW COMPARISON:  07/31/2023 FINDINGS: Endotracheal tube tip 2.3 cm from the carina. Tip and side port of the enteric tube below the diaphragm in the stomach. Left internal jugular central line tip overlies the lower SVC. Swan-Ganz catheter from an inferior approach with tip in the region of the main pulmonary outflow tract. Multiple overlying monitoring devices in place. Stable heart size and mediastinal contours. Ill-defined opacities within both perihilar regions and at the left lung apex. No pneumothorax.  IMPRESSION: 1. Left internal jugular central line tip overlies the lower SVC. No pneumothorax. Endotracheal and enteric tubes in place. 2. Swan-Ganz catheter from an inferior approach with tip in the region of the main pulmonary outflow tract. 3. Ill-defined opacities within both perihilar regions and at the left lung apex, may represent edema or infection. Probable pleural effusions. Electronically Signed   By: Chadwick Colonel M.D.   On: 08/08/2023 17:33   CARDIAC CATHETERIZATION Result Date: 08/08/2023   LV end diastolic pressure is normal.   Hemodynamic findings consistent with mild pulmonary hypertension. Normal coronary anatomy. Normal LV filling pressures. LVEDP 12 mm Hg. PCWP 9/10, mean 11 mm Hg Mild pulmonary HTN. PAP 34/21, mean 27 mm Hg Cardiac output 4.74 L/min, index 2.14 5.   Successful placement of IABP Plan: transfer to ICU for further management per CCM and Advanced heart failure team.     Medications:     Scheduled Medications:  anastrozole   1 mg Per Tube Daily   Chlorhexidine  Gluconate Cloth  6 each Topical Daily   docusate  100 mg Per Tube BID   fentaNYL  (SUBLIMAZE ) injection  50 mcg Intravenous Once   insulin  aspart  0-15 Units Subcutaneous Q4H   insulin  aspart  10 Units Subcutaneous Q6H   mouth rinse  15 mL Mouth Rinse Q2H   pantoprazole (PROTONIX) IV  40 mg Intravenous Daily   polyethylene glycol  17 g Per Tube Daily   pravastatin   20 mg Per Tube Daily    Infusions:  sodium chloride  10 mL/hr at 08/09/23 0700   sodium chloride      sodium chloride      dexmedetomidine (PRECEDEX) IV infusion Stopped (08/08/23 1617)   fentaNYL  infusion INTRAVENOUS 125 mcg/hr (08/09/23 0700)   heparin 1,200 Units/hr (08/09/23 0700)   insulin  Stopped (08/09/23 0452)   lactated ringers      And   lactated ringers      milrinone 0.125 mcg/kg/min (08/09/23 0700)   norepinephrine (LEVOPHED) Adult infusion 10 mcg/min (08/09/23 0700)   penicillin  G potassium 12 Million Units in dextrose  5 %  500 mL CONTINUOUS infusion 41.7 mL/hr at 08/09/23 0700   vasopressin 0.04 Units/min (08/09/23 0700)    PRN Medications: sodium chloride , acetaminophen  **OR** acetaminophen , dextrose , fentaNYL , melatonin, midazolam , ondansetron  (ZOFRAN ) IV, mouth rinse   Assessment/Plan   1. Cardiogenic shock: Valvular shock from severe MR.  TEE on 5/5 showed LV EF 65%, normal RV function, severe MR with perforated leaflet and mobile vegetation.  Patient developed flash pulmonary edema 5/9 in  setting of marked hypertension.  She was intubated and now on NE 10, vasopressin 0.04, milrinone 0.125. Pressors weaned overnight.   IABP at 1:1.  Co-ox good at 66.5% though thermodilution CI is low at 1.83.  CVP 9.  - Lasix 80 mg IV bid x 2 doses with K replacement and pm BMET.  - Continue IABP 1:1.  - Continue to wean pressors as able, not ideal in setting of severe MR.  - With low output and severe MR, will carefully carefully increase milrinone to 0.25.     2. Mitral regurgitation: Severe on TEE with perforated anterior leaflet and mitral valve vegetation.  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  - Continue PCN G - Management of shock from mitral regurgitation as above.  - Will need surgical repair of valve when stabilized.  3. Acute hypercarbic/hypoxemic respiratory failure.  - mechanical ventilation per CCM.  4. Hyperkalemia: Resolved.   CRITICAL CARE Performed by: Peder Bourdon  Total critical care time: 40 minutes  Critical care time was exclusive of separately billable procedures and treating other patients.  Critical care was necessary to treat or prevent imminent or life-threatening deterioration.  Critical care was time spent personally by me on the following activities: development of treatment plan with patient and/or surrogate as well as nursing, discussions with consultants, evaluation of patient's response to treatment, examination of patient, obtaining history from patient or  surrogate, ordering and performing treatments and interventions, ordering and review of laboratory studies, ordering and review of radiographic studies, pulse oximetry and re-evaluation of patient's condition.   Length of Stay: 7  Peder Bourdon, MD  08/09/2023, 7:10 AM  Advanced Heart Failure Team Pager [phone] (M-F; 7a - 5p)  Please contact [facility] for night-coverage after hours (5p -7a ) and weekends on [email or web address]

## 2023-08-09 NOTE — Plan of Care (Signed)

## 2023-08-09 NOTE — Progress Notes (Signed)
 PHARMACY - ANTICOAGULATION CONSULT NOTE  Pharmacy Consult for heparin Indication: IABP  No Known Allergies  Patient Measurements: Height: 5\' 5"  (165.1 cm) Weight: 123.8 kg (272 lb 14.9 oz) IBW/kg (Calculated) : 57 HEPARIN DW (KG): 85.3  Vital Signs: Temp: 98.6 F (37 C) (05/10 1959) BP: 128/49 (05/10 1315) Pulse Rate: 186 (05/10 1959)  Labs: Recent Labs    08/07/23 0421 08/08/23 0322 08/08/23 0956 08/09/23 0038 08/09/23 0250 08/09/23 1015 08/09/23 1550 08/09/23 1636 08/09/23 1813  HGB 10.9* 11.7*   < > 12.6 12.8  --   --  10.2*  --   HCT 33.3* 36.1   < > 37.0 38.6  --   --  30.0*  --   PLT 218 241  --   --  302  --   --   --   --   HEPARINUNFRC  --   --    < >  --  0.10* 0.15*  --   --  0.32  CREATININE 0.71 0.61   < >  --  1.11* 1.01* 0.97  --   --    < > = values in this interval not displayed.    Estimated Creatinine Clearance: 77.4 mL/min (by C-G formula based on SCr of 0.97 mg/dL).   Medical History: Past Medical History:  Diagnosis Date   Anxiety    Breast cancer (HCC)    Diabetes mellitus without complication (HCC)    Hyperlipidemia    Hypertension    Type 2 diabetes mellitus without retinopathy (HCC) 06/17/2017    Medications:  Scheduled:   anastrozole   1 mg Per Tube Daily   Chlorhexidine  Gluconate Cloth  6 each Topical Daily   docusate  100 mg Per Tube BID   feeding supplement (PROSource TF20)  60 mL Per Tube Q4H   fentaNYL  (SUBLIMAZE ) injection  50 mcg Intravenous Once   insulin  aspart  0-15 Units Subcutaneous Q4H   insulin  aspart  3 Units Subcutaneous Q4H   insulin  glargine-yfgn  10 Units Subcutaneous BID   mouth rinse  15 mL Mouth Rinse Q2H   pantoprazole (PROTONIX) IV  40 mg Intravenous Daily   polyethylene glycol  17 g Per Tube Daily   pravastatin   20 mg Per Tube Daily   sodium chloride  flush  3 mL Intravenous Q12H   thiamine  100 mg Per Tube Daily    Assessment: 23 yof presenting with acute mitral valve endocarditis with  perforated mitral leaflet and severe MR. Underwent dental extraction 5/8. Presented for pre-op RHC and developed flash pulmonary edema requiring intubation - IABP was placed in lab. No AC PTA. On heparin for IABP.  -heparin level= 0.32 and at goal on 1400 units/hr  Goal of Therapy:  Heparin level 0.2-0.5 units/ml Monitor platelets by anticoagulation protocol: Yes   Plan:  -Continue heparin at 1400 units/hr -Daily heparin level and CBC  Baxter Limber, PharmD Clinical Pharmacist **Pharmacist phone directory can now be found on [email or web address] ([email or web address]).  Listed under [facility].

## 2023-08-09 NOTE — Progress Notes (Signed)
 [facility] Infectious Diseases Follow Up Note  Patient Identification: Patient Name: Melissa Bray MRN: [id number] Admit Date: 07/31/2023  5:30 PM Age: 63 y.o.Today's Date: 08/09/2023  Reason for Visit: endocarditis, unresponsiveness yesterday   Principal Problem:   Mitral valve endocarditis Active Problems:   Essential hypertension   DM2 (diabetes mellitus, type 2) (HCC)   HLD (hyperlipidemia)   Severe sepsis (HCC)   Cellulitis of left upper extremity   Obesity, Class III, BMI 40-49.9 (morbid obesity)   Perforation of leaflet of mitral valve   GAD (generalized anxiety disorder)   History of breast cancer   Hypomagnesemia   Hypokalemia   Hyponatremia   Sinus tachycardia   Cardiogenic shock (HCC)   Shock (HCC)   Antibiotics: Vancomycin  5/10 Zosyn 5/10 Total days of antibiotics 10  Lines/Hardwares: Left IJ CVC  Interval Events: sudden onset unreponsiveness yesterday with possible flash pul edema. Code stroke was called initially but canceled later as likely related to resp distress. Patient following commands after sedation stopped later.    Assessment 63 year old female with history of DM, HLD, HTN, breast cancer status post left mastectomy who initially presented who initially presented with cellulitis of left upper extremity accompanied by fever, generalized weakness and reduced p.o. intake to intake.  Admitted with  # Acute Hypoxic Respiratory failure in the setting of flash pul edema  # Acute encephalopathy  - thought to be related to hypercapnia and resp distress and code stroke was cancelled. Plan to reassess after paralytics wears off if still concerns for stroke given left sided endocarditis   # Cardiogenic shock with severe MVR - on IABP, on vasopressors  # Group B streptococcus bacteremia  # Native mitral valve endocarditis with leaflet perforation 5/8 S/P dental extractions 11, 14 and 30  # Leukocytosis  - likely reactive 2/2 acute events  Recommendations - Antibiotics escalated to Vancomcyin and zosyn today per PCCM in light of acute decompensation yesterday and concerns for ?aspiration PNA.  However CXR today with some improvement. Vasopressors requirement decreasing. Doubt PNA. MRSA PCR is negative and Vancomycin  can be discontinued. - Monitor CBC and CMP  - Vent management, Fluid and electrolytes and IABP per PCCM/Cards - D/W pccm  Rest of the management as per the primary team. Thank you for the consult. Please page with pertinent questions or concerns.  ______________________________________________________________________ Subjective patient seen and examined at the bedside. Follows commands    Past Medical History:  Diagnosis Date   Anxiety    Breast cancer (HCC)    Diabetes mellitus without complication (HCC)    Hyperlipidemia    Hypertension    Type 2 diabetes mellitus without retinopathy (HCC) 06/17/2017   Past Surgical History:  Procedure Laterality Date   ABDOMINAL HYSTERECTOMY     BREAST BIOPSY     MASTECTOMY Left 2016   TOOTH EXTRACTION N/A 08/07/2023   Procedure: EXTRACTION TEETH NUMBERS ELEVEN, THIRTEEN, AND FOURTEEN;  Surgeon: Ascencion Lava, DMD;  Location: [facility] OR;  Service: Oral Surgery;  Laterality: N/A;   TRANSESOPHAGEAL ECHOCARDIOGRAM (CATH LAB) N/A 08/04/2023   Procedure: TRANSESOPHAGEAL ECHOCARDIOGRAM;  Surgeon: Hazle Lites, MD;  Location: [facility] INVASIVE CV LAB;  Service: Cardiovascular;  Laterality: N/A;    Vitals BP (!) 98/55 Comment: IABP  Pulse (!) 273   Temp 98.8 F (37.1 C)   Resp (!) 28   Ht 5\' 5"  (1.651 m)   Wt 123.8 kg   SpO2 99%   BMI 45.42 kg/m     Physical Exam Constitutional:  Adult critically  ill female lying in the bed    Comments: HEENT, on vasopressors, milrinone and IABP  Cardiovascular:     Rate and Rhythm: Normal rate and regular rhythm.     Heart sounds: s1s2  Pulmonary:     Effort: Pulmonary effort is normal on vent      Comments: coarse breath sounds b/l   Abdominal:     Palpations: Abdomen is soft.     Tenderness: non distended and non tender  Musculoskeletal:        General: no signs of septic peripheral joint   Skin:    Comments: no rashes   Neurological:     General: awake, alert and oriented, follows commands appropriately, able to squeeze by bilateral hands   Pertinent Microbiology Results for orders placed or performed during the hospital encounter of 07/31/23  Resp panel by RT-PCR (RSV, Flu A&B, Covid) Anterior Nasal Swab     Status: None   Collection Time: 07/31/23  5:36 PM   Specimen: Anterior Nasal Swab  Result Value Ref Range Status   SARS Coronavirus 2 by RT PCR NEGATIVE NEGATIVE Final    Comment: (NOTE) SARS-CoV-2 target nucleic acids are NOT DETECTED.  The SARS-CoV-2 RNA is generally detectable in upper respiratory specimens during the acute phase of infection. The lowest concentration of SARS-CoV-2 viral copies this assay can detect is 138 copies/mL. A negative result does not preclude SARS-Cov-2 infection and should not be used as the sole basis for treatment or other patient management decisions. A negative result may occur with  improper specimen collection/handling, submission of specimen other than nasopharyngeal swab, presence of viral mutation(s) within the areas targeted by this assay, and inadequate number of viral copies(<138 copies/mL). A negative result must be combined with clinical observations, patient history, and epidemiological information. The expected result is Negative.  Fact Sheet for Patients:  BloggerCourse.com  Fact Sheet for Healthcare Providers:  SeriousBroker.it  This test is no t yet approved or cleared by the [place]  FDA and  has been authorized for detection and/or diagnosis of SARS-CoV-2 by FDA under an Emergency Use Authorization (EUA). This EUA will remain  in effect (meaning  this test can be used) for the duration of the COVID-19 declaration under Section 564(b)(1) of the Act, 21 U.S.C.section 360bbb-3(b)(1), unless the authorization is terminated  or revoked sooner.       Influenza A by PCR NEGATIVE NEGATIVE Final   Influenza B by PCR NEGATIVE NEGATIVE Final    Comment: (NOTE) The Xpert Xpress SARS-CoV-2/FLU/RSV plus assay is intended as an aid in the diagnosis of influenza from Nasopharyngeal swab specimens and should not be used as a sole basis for treatment. Nasal washings and aspirates are unacceptable for Xpert Xpress SARS-CoV-2/FLU/RSV testing.  Fact Sheet for Patients: BloggerCourse.com  Fact Sheet for Healthcare Providers: SeriousBroker.it  This test is not yet approved or cleared by the [place]  FDA and has been authorized for detection and/or diagnosis of SARS-CoV-2 by FDA under an Emergency Use Authorization (EUA). This EUA will remain in effect (meaning this test can be used) for the duration of the COVID-19 declaration under Section 564(b)(1) of the Act, 21 U.S.C. section 360bbb-3(b)(1), unless the authorization is terminated or revoked.     Resp Syncytial Virus by PCR NEGATIVE NEGATIVE Final    Comment: (NOTE) Fact Sheet for Patients: BloggerCourse.com  Fact Sheet for Healthcare Providers: SeriousBroker.it  This test is not yet approved or cleared by the [place]  FDA and has been authorized for detection  and/or diagnosis of SARS-CoV-2 by FDA under an Emergency Use Authorization (EUA). This EUA will remain in effect (meaning this test can be used) for the duration of the COVID-19 declaration under Section 564(b)(1) of the Act, 21 U.S.C. section 360bbb-3(b)(1), unless the authorization is terminated or revoked.  Performed at [facility], [address] [place], [place] [address]   Blood Culture (routine x  2)     Status: Abnormal   Collection Time: 07/31/23  6:48 PM   Specimen: BLOOD  Result Value Ref Range Status   Specimen Description   Final    BLOOD LEFT ANTECUBITAL Performed at [facility], [address] [place], [place] [address]    Special Requests   Final    BOTTLES DRAWN AEROBIC AND ANAEROBIC Blood Culture adequate volume Performed at [facility], [address] [place], [place] [address]    Culture  Setup Time   Final    GRAM POSITIVE COCCI IN BOTH AEROBIC AND ANAEROBIC BOTTLES CRITICAL VALUE NOTED.  VALUE IS CONSISTENT WITH PREVIOUSLY REPORTED AND CALLED VALUE.    Culture (A)  Final    GROUP B STREP(S.AGALACTIAE)ISOLATED SUSCEPTIBILITIES PERFORMED ON PREVIOUS CULTURE WITHIN THE LAST 5 DAYS. Performed at [facility], [address], [place], [place] [address]    Report Status 08/03/2023 FINAL  Final  Blood Culture (routine x 2)     Status: Abnormal   Collection Time: 07/31/23  7:01 PM   Specimen: BLOOD  Result Value Ref Range Status   Specimen Description   Final    BLOOD RIGHT ANTECUBITAL Performed at [facility], [address] [place], [place] [address]    Special Requests   Final    BOTTLES DRAWN AEROBIC AND ANAEROBIC Blood Culture adequate volume Performed at [facility], [address] [place], [place] [address]    Culture  Setup Time   Final    GRAM POSITIVE COCCI IN BOTH AEROBIC AND ANAEROBIC BOTTLES CRITICAL RESULT CALLED TO, READ BACK BY AND VERIFIED WITH: [facility] MCICHELLE BELL [id number] AT 0816 BY EC Performed at [facility], [address] [place], [place] [address]    Culture GROUP B STREP(S.AGALACTIAE)ISOLATED (A)  Final   Report Status 08/03/2023 FINAL  Final   Organism ID, Bacteria GROUP B STREP(S.AGALACTIAE)ISOLATED  Final      Susceptibility   Group b strep(s.agalactiae)isolated - MIC*    CLINDAMYCIN <=0.25 SENSITIVE Sensitive     AMPICILLIN <=0.25 SENSITIVE Sensitive      ERYTHROMYCIN <=0.12 SENSITIVE Sensitive     VANCOMYCIN  0.5 SENSITIVE Sensitive     CEFTRIAXONE  <=0.12 SENSITIVE Sensitive     LEVOFLOXACIN 1 SENSITIVE Sensitive     PENICILLIN  <=0.06 SENSITIVE Sensitive     * GROUP B STREP(S.AGALACTIAE)ISOLATED  Blood Culture ID Panel (Reflexed)     Status: Abnormal   Collection Time: 07/31/23  7:01 PM  Result Value Ref Range Status   Enterococcus faecalis NOT DETECTED NOT DETECTED Final   Enterococcus Faecium NOT DETECTED NOT DETECTED Final   Listeria monocytogenes NOT DETECTED NOT DETECTED Final   Staphylococcus species NOT DETECTED NOT DETECTED Final   Staphylococcus aureus (BCID) NOT DETECTED NOT DETECTED Final   Staphylococcus epidermidis NOT DETECTED NOT DETECTED Final   Staphylococcus lugdunensis NOT DETECTED NOT DETECTED Final   Streptococcus species DETECTED (A) NOT DETECTED Final    Comment: CRITICAL RESULT CALLED TO, READ BACK BY AND VERIFIED WITH: PHARMD MICHELLE  BELL [id number] AT 0816 BY EC    Streptococcus agalactiae DETECTED (A) NOT DETECTED Final    Comment: CRITICAL RESULT CALLED TO, READ BACK BY AND VERIFIED WITH: PHARMD MICHELLE BELL [id number] AT 0816 BY EC    Streptococcus pneumoniae NOT DETECTED NOT DETECTED Final   Streptococcus pyogenes NOT DETECTED NOT DETECTED Final   A.calcoaceticus-baumannii NOT DETECTED NOT DETECTED Final   Bacteroides fragilis NOT DETECTED NOT DETECTED Final   Enterobacterales NOT DETECTED NOT DETECTED Final   Enterobacter cloacae complex NOT DETECTED NOT DETECTED Final   Escherichia coli NOT DETECTED NOT DETECTED Final   Klebsiella aerogenes NOT DETECTED NOT DETECTED Final   Klebsiella oxytoca NOT DETECTED NOT DETECTED Final   Klebsiella pneumoniae NOT DETECTED NOT DETECTED Final   Proteus species NOT DETECTED NOT DETECTED Final   Salmonella species NOT DETECTED NOT DETECTED Final   Serratia marcescens NOT DETECTED NOT DETECTED Final   Haemophilus influenzae NOT DETECTED NOT DETECTED Final   Neisseria  meningitidis NOT DETECTED NOT DETECTED Final   Pseudomonas aeruginosa NOT DETECTED NOT DETECTED Final   Stenotrophomonas maltophilia NOT DETECTED NOT DETECTED Final   Candida albicans NOT DETECTED NOT DETECTED Final   Candida auris NOT DETECTED NOT DETECTED Final   Candida glabrata NOT DETECTED NOT DETECTED Final   Candida krusei NOT DETECTED NOT DETECTED Final   Candida parapsilosis NOT DETECTED NOT DETECTED Final   Candida tropicalis NOT DETECTED NOT DETECTED Final   Cryptococcus neoformans/gattii NOT DETECTED NOT DETECTED Final    Comment: Performed at [facility], [address] [place], [place] [address]  Culture, [place] Urine     Status: None   Collection Time: 08/01/23 12:27 AM   Specimen: Urine, Random  Result Value Ref Range Status   Specimen Description   Final    URINE, RANDOM Performed at [facility], [address] [place], [place] [address]    Special Requests   Final    NONE Performed at [facility], [address], [place], [place] [address]    Culture   Final    NO GROWTH NO GROUP B STREP (S.AGALACTIAE) ISOLATED Performed at [facility], [address] [place], [place] [address]    Report Status 08/02/2023 FINAL  Final  Culture, blood (Routine X 2) w Reflex to ID Panel     Status: None   Collection Time: 08/02/23 11:55 AM   Specimen: BLOOD RIGHT ARM  Result Value Ref Range Status   Specimen Description   Final    BLOOD RIGHT ARM Performed at [facility], [address], [place], [place] [address]    Special Requests   Final    BOTTLES DRAWN AEROBIC AND ANAEROBIC Blood Culture results may not be optimal due to an inadequate volume of blood received in culture bottles Performed at [facility], [address], [place], [place] [address]    Culture   Final    NO GROWTH 5 DAYS Performed at [facility], [address] [place], [place] [address]    Report Status 08/07/2023  FINAL  Final  Culture, blood (Routine X 2) w Reflex to ID Panel     Status: None   Collection Time: 08/02/23 12:01 PM   Specimen: BLOOD RIGHT HAND  Result Value Ref Range Status   Specimen Description   Final    BLOOD RIGHT HAND Performed at [facility], [address], [place], [place] [address]  Special Requests   Final    BOTTLES DRAWN AEROBIC ONLY Blood Culture results may not be optimal due to an inadequate volume of blood received in culture bottles Performed at [facility], [address], [place], [place] [address]    Culture   Final    NO GROWTH 5 DAYS Performed at [facility], [address] [place], [place] [address]    Report Status 08/07/2023 FINAL  Final  MRSA Next Gen by PCR, Nasal     Status: None   Collection Time: 08/08/23  8:42 PM   Specimen: Nasal Mucosa; Nasal Swab  Result Value Ref Range Status   MRSA by PCR Next Gen NOT DETECTED NOT DETECTED Final    Comment: (NOTE) The GeneXpert MRSA Assay (FDA approved for NASAL specimens only), is one component of a comprehensive MRSA colonization surveillance program. It is not intended to diagnose MRSA infection nor to guide or monitor treatment for MRSA infections. Test performance is not FDA approved in patients less than 22 years old. Performed at [facility], [address] [place], [place] [address]   Culture, blood (Routine X 2) w Reflex to ID Panel     Status: None (Preliminary result)   Collection Time: 08/09/23  2:50 AM   Specimen: BLOOD RIGHT HAND  Result Value Ref Range Status   Specimen Description BLOOD RIGHT HAND  Final   Special Requests   Final    BOTTLES DRAWN AEROBIC AND ANAEROBIC Blood Culture adequate volume   Culture   Final    NO GROWTH < 12 HOURS Performed at [facility], [address] [place], [place] [address]    Report Status PENDING  Incomplete  Culture, blood (Routine X 2) w Reflex to ID Panel     Status: None (Preliminary result)    Collection Time: 08/09/23  2:50 AM   Specimen: BLOOD RIGHT HAND  Result Value Ref Range Status   Specimen Description BLOOD RIGHT HAND  Final   Special Requests   Final    BOTTLES DRAWN AEROBIC AND ANAEROBIC Blood Culture results may not be optimal due to an inadequate volume of blood received in culture bottles   Culture   Final    NO GROWTH < 12 HOURS Performed at [facility], [address] [place], [place] [address]    Report Status PENDING  Incomplete   Pertinent Lab.    Latest Ref Rng & Units 08/09/2023    2:50 AM 08/09/2023   12:38 AM 08/08/2023    4:34 PM  CBC  WBC 4.0 - 10.5 K/uL 16.0     Hemoglobin 12.0 - 15.0 g/dL 78.4  69.6  29.5   Hematocrit 36.0 - 46.0 % 38.6  37.0  40.0   Platelets 150 - 400 K/uL 302         Latest Ref Rng & Units 08/09/2023   10:15 AM 08/09/2023    2:50 AM 08/09/2023   12:38 AM  CMP  Glucose 70 - 99 mg/dL 284  132    BUN 8 - 23 mg/dL 13  13    Creatinine 4.40 - 1.00 mg/dL 1.02  7.25    Sodium 366 - 145 mmol/L 134  137  137   Potassium 3.5 - 5.1 mmol/L 4.5  3.9  3.8   Chloride 98 - 111 mmol/L 98  100    CO2 22 - 32 mmol/L 24  25    Calcium 8.9 - 10.3  mg/dL 8.9  9.3     Pertinent Imaging today Plain films and CT images have been personally visualized and interpreted; radiology reports have been reviewed. Decision making incorporated into the Impression /   DG Chest Port 1 View Result Date: 08/09/2023 CLINICAL DATA:  Acute intra-aortic balloon pump. History of mitral valve endocarditis. EXAM: PORTABLE CHEST 1 VIEW COMPARISON:  08/08/2023 FINDINGS: There is a left IJ catheter with tip at the superior cavoatrial junction. ETT tip is stable above the carina. Enteric tube tip courses below the GE junction. Marker for the intra-balloon pump is just below the aortic knob. Stable cardiomediastinal contours. There is been interval improvement in aeration to both upper lung zones. Persistent bilateral lower lobe opacities. IMPRESSION: 1. Interval  improvement in aeration to both upper lung zones. 2. Persistent bilateral lower lobe opacities. 3. Support apparatus as above. Electronically Signed   By: Kimberley Penman M.D.   On: 08/09/2023 10:06   Port CXR Result Date: 08/08/2023 CLINICAL DATA:  Central line placement. EXAM: PORTABLE CHEST 1 VIEW COMPARISON:  07/31/2023 FINDINGS: Endotracheal tube tip 2.3 cm from the carina. Tip and side port of the enteric tube below the diaphragm in the stomach. Left internal jugular central line tip overlies the lower SVC. Swan-Ganz catheter from an inferior approach with tip in the region of the main pulmonary outflow tract. Multiple overlying monitoring devices in place. Stable heart size and mediastinal contours. Ill-defined opacities within both perihilar regions and at the left lung apex. No pneumothorax. IMPRESSION: 1. Left internal jugular central line tip overlies the lower SVC. No pneumothorax. Endotracheal and enteric tubes in place. 2. Swan-Ganz catheter from an inferior approach with tip in the region of the main pulmonary outflow tract. 3. Ill-defined opacities within both perihilar regions and at the left lung apex, may represent edema or infection. Probable pleural effusions. Electronically Signed   By: Chadwick Colonel M.D.   On: 08/08/2023 17:33   CARDIAC CATHETERIZATION Result Date: 08/08/2023   LV end diastolic pressure is normal.   Hemodynamic findings consistent with mild pulmonary hypertension. Normal coronary anatomy. Normal LV filling pressures. LVEDP 12 mm Hg. PCWP 9/10, mean 11 mm Hg Mild pulmonary HTN. PAP 34/21, mean 27 mm Hg Cardiac output 4.74 L/min, index 2.14 5.   Successful placement of IABP Plan: transfer to ICU for further management per CCM and Advanced heart failure team.   DG Orthopantogram Result Date: 08/04/2023 CLINICAL DATA:  History of mitral regurgitation, evaluate for upcoming cardiac surgery EXAM: ORTHOPANTOGRAM/PANORAMIC COMPARISON:  None Available. FINDINGS: Mandible is  intact. No acute fracture is noted. Dental caries are noted involving the left maxillary canine with periapical lucency identified. Suggestion of periapical lucency is noted involving the left central and lateral incisors. The first left maxillary molar is noted to be involved with dental caries with only a small residual root left. Similar findings are noted on the right in the mandible involving what appears to be residual molar. No other periapical lucencies are seen. IMPRESSION: Changes suggestive of periapical abscess involving the left maxillary canine with almost complete loss of the tooth. Suspicious. Apical lucency is noted involving left maxillary central and lateral incisors. Similar findings are noted involving a right mandibular molar and left maxillary molar without evidence of periapical lucency. Electronically Signed   By: Violeta Grey M.D.   On: 08/04/2023 19:52   ECHO TEE Result Date: 08/04/2023    TRANSESOPHOGEAL ECHO REPORT   Patient Name:   ELEENE NICOLAUS Date of Exam: 08/04/2023 Medical  Rec #:  [id number]     Height:       65.0 in Accession #:    [id number]    Weight:       270.9 lb Date of Birth:  1961/02/01     BSA:          2.251 m Patient Age:    62 years      BP:           120/80 mmHg Patient Gender: F             HR:           121 bpm. Exam Location:  Inpatient Procedure: Transesophageal Echo, 3D Echo, Cardiac Doppler and Color Doppler            (Both Spectral and Color Flow Doppler were utilized during            procedure). Indications:     Mitral Valve Abnormality  History:         Patient has prior history of Echocardiogram examinations, most                  recent 08/03/2023. Mitral Valve Disease, Signs/Symptoms:Murmur;                  Risk Factors:Diabetes and Hypertension.  Sonographer:     Astrid Blamer Referring Phys:  [id number] Av[address] Lemmings HILTY Diagnosing Phys: Dinah Franco MD PROCEDURE: After discussion of the risks and benefits of a TEE, an informed consent was obtained from the  patient. The transesophogeal probe was passed without difficulty through the esophogus of the patient. Sedation performed by different physician. The patient was monitored while under deep sedation. Anesthestetic sedation was provided intravenously by Anesthesiology: 220mg  of Propofol , 100mg  of Lidocaine . The patient developed no complications during the procedure.  IMPRESSIONS  1. Left ventricular ejection fraction, by estimation, is 65 to 70%. The left ventricle has hyperdynamic function.  2. Right ventricular systolic function is normal. The right ventricular size is normal.  3. No left atrial/left atrial appendage thrombus was detected. The LAA emptying velocity was 65 cm/s.  4. Mobile vegetation noted on the anterior mitral leaflet with perforation of the leaflet. There is thickening and probably vegetation of the posterior leaflet as well. The mitral valve is abnormal. Severe mitral valve regurgitation. No evidence of mitral stenosis.  5. The aortic valve is tricuspid. Aortic valve regurgitation is not visualized.  6. Agitated saline contrast bubble study was negative, with no evidence of any interatrial shunt.  7. 3D performed of the mitral valve and demonstrates Endocarditis with perforation of the anterior leaflet and severe eccentric MR. Conclusion(s)/Recommendation(s): Findings are concerning for vegetation/infective endocarditis as detailed above. Findings concerning for mitral valve vegetation. Cardiology evaluation and eventual CT surgical evaluation recommended. Would be best to transfer to [facility] for further work-up. FINDINGS  Left Ventricle: Left ventricular ejection fraction, by estimation, is 65 to 70%. The left ventricle has hyperdynamic function. The left ventricular internal cavity size was normal in size. There is no left ventricular hypertrophy. Right Ventricle: The right ventricular size is normal. No increase in right ventricular wall thickness. Right ventricular systolic function is  normal. Left Atrium: Left atrial size was normal in size. No left atrial/left atrial appendage thrombus was detected. The LAA emptying velocity was 65 cm/s. Right Atrium: Right atrial size was normal in size. Pericardium: There is no evidence of pericardial effusion. Mitral Valve: Mobile vegetation noted on the anterior mitral leaflet with perforation  of the leaflet. There is thickening and probably vegetation of the posterior leaflet as well. The mitral valve is abnormal. Severe mitral valve regurgitation, with eccentric anteriorly directed jet. No evidence of mitral valve stenosis. Pulmonary venous flow shows systolic flow reversal. Tricuspid Valve: The tricuspid valve is grossly normal. Tricuspid valve regurgitation is mild. Aortic Valve: The aortic valve is tricuspid. Aortic valve regurgitation is not visualized. Pulmonic Valve: The pulmonic valve was grossly normal. Pulmonic valve regurgitation is trivial. Aorta: The aortic root and ascending aorta are structurally normal, with no evidence of dilitation. Venous: The left upper pulmonary vein is normal. IAS/Shunts: The interatrial septum is aneurysmal. There is right bowing of the interatrial septum, suggestive of elevated left atrial pressure. No atrial level shunt detected by color flow Doppler. Agitated saline contrast was given intravenously to evaluate for intracardiac shunting. Agitated saline contrast bubble study was negative, with no evidence of any interatrial shunt. Additional Comments: 3D was performed not requiring image post processing on an independent workstation and was abnormal. Dinah Franco MD Electronically signed by Dinah Franco MD Signature Date/Time: 08/04/2023/1:31:14 PM    Final    EP STUDY Result Date: 08/04/2023 See surgical note for result.  ECHOCARDIOGRAM COMPLETE Result Date: 08/03/2023    ECHOCARDIOGRAM REPORT   Patient Name:   MERTIE KROHN Date of Exam: 08/03/2023 Medical Rec #:  [id number]     Height:       65.0 in Accession #:     [id number]    Weight:       270.9 lb Date of Birth:  03/18/1961     BSA:          2.251 m Patient Age:    62 years      BP:           115/68 mmHg Patient Gender: F             HR:           106 bpm. Exam Location:  Inpatient Procedure: 2D Echo, Cardiac Doppler and Color Doppler (Both Spectral and Color            Flow Doppler were utilized during procedure). Indications:    Bacteremia; R01.1 Murmur  History:        Patient has no prior history of Echocardiogram examinations.                 Signs/Symptoms:Bacteremia; Risk Factors:Hypertension,                 Dyslipidemia and Diabetes. Breast cancer.  Sonographer:    Raynelle Callow RDCS Referring Phys: [id number] CHRISTOPHER P DANFORD  Sonographer Comments: Patient is obese. Image acquisition challenging due to mastectomy. IMPRESSIONS  1. Left ventricular ejection fraction, by estimation, is 65 to 70%. The left ventricle has normal function. The left ventricle has no regional wall motion abnormalities. Left ventricular diastolic parameters are indeterminate.  2. Right ventricular systolic function is normal. The right ventricular size is normal. Tricuspid regurgitation signal is inadequate for assessing PA pressure.  3. Moderate to probably severe mitral regurgitation. Regurgitant jet originates from the proximal portion of the anterior mitral valve leaflet as opposed to the distal coapation point suggesting likely perforated anterior mitral valve leaflet. Recommend  TEE to better evaluate. . The mitral valve is abnormal. Moderate to severe mitral valve regurgitation. No evidence of mitral stenosis.  4. The aortic valve is tricuspid. Aortic valve regurgitation is not visualized. No aortic stenosis is present. FINDINGS  Left Ventricle: Left  ventricular ejection fraction, by estimation, is 65 to 70%. The left ventricle has normal function. The left ventricle has no regional wall motion abnormalities. The left ventricular internal cavity size was normal in size. There is   no left ventricular hypertrophy. Left ventricular diastolic parameters are indeterminate. Right Ventricle: The right ventricular size is normal. Right vetricular wall thickness was not well visualized. Right ventricular systolic function is normal. Tricuspid regurgitation signal is inadequate for assessing PA pressure. Left Atrium: Left atrial size was normal in size. Right Atrium: Right atrial size was normal in size. Pericardium: There is no evidence of pericardial effusion. Mitral Valve: Moderate to probably severe mitral regurgitation. Regurgitant jet originates from the proximal portion of the anterior mitral valve leaflet as opposed to the distal coapation point suggesting likely perforated anterior mitral valve leaflet.  Recommend TEE to better evaluate. The mitral valve is abnormal. There is mild thickening of the mitral valve leaflet(s). There is mild calcification of the mitral valve leaflet(s). Mild mitral annular calcification. Moderate to severe mitral valve regurgitation. No evidence of mitral valve stenosis. MV peak gradient, 12.7 mmHg. The mean mitral valve gradient is 5.0 mmHg. Tricuspid Valve: The tricuspid valve is normal in structure. Tricuspid valve regurgitation is trivial. No evidence of tricuspid stenosis. Aortic Valve: The aortic valve is tricuspid. Aortic valve regurgitation is not visualized. No aortic stenosis is present. Pulmonic Valve: The pulmonic valve was not well visualized. Pulmonic valve regurgitation is not visualized. No evidence of pulmonic stenosis. Aorta: The aortic root and ascending aorta are structurally normal, with no evidence of dilitation. Venous: The inferior vena cava was not well visualized. IAS/Shunts: No atrial level shunt detected by color flow Doppler.  LEFT VENTRICLE PLAX 2D LVIDd:         4.10 cm     Diastology LVIDs:         2.80 cm     LV e' medial:    4.24 cm/s LV PW:         0.90 cm     LV E/e' medial:  27.6 LV IVS:        1.00 cm     LV e' lateral:   5.44  cm/s LVOT diam:     2.30 cm     LV E/e' lateral: 21.5 LV SV:         57 LV SV Index:   25 LVOT Area:     4.15 cm  LV Volumes (MOD) LV vol d, MOD A2C: 94.3 ml LV vol d, MOD A4C: 99.8 ml LV vol s, MOD A2C: 29.9 ml LV vol s, MOD A4C: 39.0 ml LV SV MOD A2C:     64.4 ml LV SV MOD A4C:     99.8 ml LV SV MOD BP:      64.5 ml RIGHT VENTRICLE             IVC RV S prime:     12.30 cm/s  IVC diam: 1.60 cm TAPSE (M-mode): 1.9 cm LEFT ATRIUM             Index        RIGHT ATRIUM           Index LA diam:        4.00 cm 1.78 cm/m   RA Area:     10.70 cm LA Vol (A2C):   52.3 ml 23.24 ml/m  RA Volume:   20.70 ml  9.20 ml/m LA Vol (A4C):   52.1 ml 23.15 ml/m  LA Biplane Vol: 51.8 ml 23.02 ml/m  AORTIC VALVE             PULMONIC VALVE LVOT Vmax:   95.00 cm/s  PR End Diast Vel: 1.42 msec LVOT Vmean:  60.800 cm/s LVOT VTI:    0.136 m  AORTA Ao Root diam: 2.70 cm Ao Asc diam:  3.20 cm MITRAL VALVE MV Area (PHT): 4.89 cm       SHUNTS MV Area VTI:   2.81 cm       Systemic VTI:  0.14 m MV Peak grad:  12.7 mmHg      Systemic Diam: 2.30 cm MV Mean grad:  5.0 mmHg MV Vmax:       1.78 m/s MV Vmean:      97.1 cm/s MV Decel Time: 155 msec MR Peak grad:    108.2 mmHg MR Mean grad:    67.0 mmHg MR Vmax:         520.00 cm/s MR Vmean:        387.0 cm/s MR PISA:         7.60 cm MR PISA Eff ROA: 56 mm MR PISA Radius:  1.10 cm MV E velocity: 117.00 cm/s MV A velocity: 147.00 cm/s MV E/A ratio:  0.80 Armida Lander MD Electronically signed by Armida Lander MD Signature Date/Time: 08/03/2023/1:16:09 PM    Final    VAS US  UPPER EXTREMITY VENOUS DUPLEX Result Date: 08/01/2023 UPPER VENOUS STUDY  Patient Name:  TRANIKA SUGGS  Date of Exam:   08/01/2023 Medical Rec #: [id number]      Accession #:    [id number] Date of Birth: 22-Mar-1961      Patient Gender: F Patient Age:   29 years Exam Location:  [facility] Procedure:      VAS US  UPPER EXTREMITY VENOUS DUPLEX Referring Phys: JUSTIN HOWERTER  --------------------------------------------------------------------------------  Indications: Swelling Risk Factors: Cancer breast past pregnancy and obesity. Comparison Study: None. Performing Technologist: Estanislao Heimlich  Examination Guidelines: A complete evaluation includes B-mode imaging, spectral Doppler, color Doppler, and power Doppler as needed of all accessible portions of each vessel. Bilateral testing is considered an integral part of a complete examination. Limited examinations for reoccurring indications may be performed as noted.  Right Findings: +----------+------------+---------+-----------+----------+-------+ RIGHT     CompressiblePhasicitySpontaneousPropertiesSummary +----------+------------+---------+-----------+----------+-------+ Subclavian    Full       Yes       Yes                      +----------+------------+---------+-----------+----------+-------+  Left Findings: +----------+------------+---------+-----------+----------+-------+ LEFT      CompressiblePhasicitySpontaneousPropertiesSummary +----------+------------+---------+-----------+----------+-------+ IJV           Full       Yes       Yes                      +----------+------------+---------+-----------+----------+-------+ Subclavian    Full       Yes       Yes                      +----------+------------+---------+-----------+----------+-------+ Axillary      Full       Yes       Yes                      +----------+------------+---------+-----------+----------+-------+ Brachial      Full       Yes       Yes                      +----------+------------+---------+-----------+----------+-------+  Radial        Full       Yes       Yes                      +----------+------------+---------+-----------+----------+-------+ Ulnar         Full       Yes       Yes                      +----------+------------+---------+-----------+----------+-------+ Cephalic      Full                  Yes                      +----------+------------+---------+-----------+----------+-------+ Basilic       Full                 Yes                      +----------+------------+---------+-----------+----------+-------+  Summary:  Right: No evidence of thrombosis in the subclavian.  Left: No evidence of deep vein thrombosis in the upper extremity. No evidence of superficial vein thrombosis in the upper extremity.  *See table(s) above for measurements and observations.  Diagnosing physician: Delaney Fearing Electronically signed by Delaney Fearing on 08/01/2023 at 4:52:24 PM.    Final    DG Chest 2 View Result Date: 07/31/2023 CLINICAL DATA:  Fever and chills. EXAM: CHEST - 2 VIEW COMPARISON:  November 23, 2019 FINDINGS: The heart size and mediastinal contours are within normal limits. Low lung volumes are noted with mild elevation of the right hemidiaphragm. There is no evidence of acute infiltrate, pleural effusion or pneumothorax. Radiopaque surgical clips are seen overlying the lateral aspect of the upper left hemithorax. The visualized skeletal structures are unremarkable. IMPRESSION: No active cardiopulmonary disease. Electronically Signed   By: Virgle Grime M.D.   On: 07/31/2023 18:46     I have personally spent at least 50 minutes involved in face-to-face and non-face-to-face activities for this patient on the day of the visit. Professional time spent includes the following activities: Preparing to see the patient (review of tests), Obtaining and/or reviewing separately obtained history (admission/discharge record), Performing a medically appropriate examination and/or evaluation , Ordering medications/tests/procedures, referring and communicating with other health care professionals, Documenting clinical information in the EMR, Independently interpreting results (not separately reported), Communicating results to the patient/family/caregiver, Counseling and educating the  patient/family/caregiver and Care coordination (not separately reported).   Plan d/w requesting provider as well as ID pharm D  Of note, portions of this note may have been created with voice recognition software. While this note has been edited for accuracy, occasional wrong-word or 'sound-a-like' substitutions may have occurred due to the inherent limitations of voice recognition software.   Electronically signed by:   Terre Ferri, MD Infectious Disease Physician [facility] Pager: [phone]

## 2023-08-09 NOTE — Progress Notes (Signed)
 Pharmacy Antibiotic Note  Melissa Bray is a 63 y.o. female admitted on 07/31/2023 with pneumonia. Pharmacy has been consulted for vancomycin  and Zosyn dosing. 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  Plan: Zosyn EI q8h Vancomycin  2500mg  IV x1 then 1250mg  IV q24h - est AUC 477  Height: 5\' 5"  (165.1 cm) Weight: 123.8 kg (272 lb 14.9 oz) IBW/kg (Calculated) : 57  Temp (24hrs), Avg:98.6 F (37 C), Min:98.1 F (36.7 C), Max:99.1 F (37.3 C)  Recent Labs  Lab 08/04/23 0509 08/06/23 1046 08/07/23 0421 08/08/23 0322 08/08/23 1120 08/08/23 1405 08/08/23 1637 08/08/23 1836 08/08/23 2118 08/08/23 2252 08/09/23 0250  WBC 4.8 6.6 5.7 6.0  --   --   --   --   --   --  16.0*  CREATININE 0.94 0.67 0.71 0.61  --  1.13* 1.17*  --   --  1.15* 1.11*  LATICACIDVEN  --   --   --   --  2.1*  --   --  2.5* 2.6*  --   --     Estimated Creatinine Clearance: 67.7 mL/min (A) (by C-G formula based on SCr of 1.11 mg/dL (H)).    No Known Allergies   Levin Reamer, PharmD, BCPS, [facility] Clinical Pharmacist [phone] Please check AMION for all [facility] numbers 08/09/2023

## 2023-08-10 ENCOUNTER — Other Ambulatory Visit (HOSPITAL_COMMUNITY)

## 2023-08-10 ENCOUNTER — Inpatient Hospital Stay (HOSPITAL_COMMUNITY)

## 2023-08-10 DIAGNOSIS — J9602 Acute respiratory failure with hypercapnia: Secondary | ICD-10-CM | POA: Diagnosis not present

## 2023-08-10 DIAGNOSIS — I340 Nonrheumatic mitral (valve) insufficiency: Secondary | ICD-10-CM | POA: Diagnosis not present

## 2023-08-10 DIAGNOSIS — J810 Acute pulmonary edema: Secondary | ICD-10-CM | POA: Diagnosis not present

## 2023-08-10 DIAGNOSIS — J9601 Acute respiratory failure with hypoxia: Secondary | ICD-10-CM | POA: Diagnosis not present

## 2023-08-10 DIAGNOSIS — R570 Cardiogenic shock: Secondary | ICD-10-CM | POA: Diagnosis not present

## 2023-08-10 DIAGNOSIS — I059 Rheumatic mitral valve disease, unspecified: Secondary | ICD-10-CM | POA: Diagnosis not present

## 2023-08-10 LAB — GLUCOSE, CAPILLARY
Glucose-Capillary: 241 mg/dL — ABNORMAL HIGH (ref 70–99)
Glucose-Capillary: 252 mg/dL — ABNORMAL HIGH (ref 70–99)
Glucose-Capillary: 298 mg/dL — ABNORMAL HIGH (ref 70–99)
Glucose-Capillary: 240 mg/dL — ABNORMAL HIGH (ref 70–99)
Glucose-Capillary: 310 mg/dL — ABNORMAL HIGH (ref 70–99)

## 2023-08-10 LAB — BASIC METABOLIC PANEL WITH GFR
Anion gap: 10 (ref 5–15)
Anion gap: 8 (ref 5–15)
BUN: 11 mg/dL (ref 8–23)
BUN: 15 mg/dL (ref 8–23)
CO2: 27 mmol/L (ref 22–32)
CO2: 27 mmol/L (ref 22–32)
Calcium: 8.5 mg/dL — ABNORMAL LOW (ref 8.9–10.3)
Calcium: 8.8 mg/dL — ABNORMAL LOW (ref 8.9–10.3)
Chloride: 97 mmol/L — ABNORMAL LOW (ref 98–111)
Chloride: 98 mmol/L (ref 98–111)
Creatinine, Ser: 0.88 mg/dL (ref 0.44–1.00)
Creatinine, Ser: 0.95 mg/dL (ref 0.44–1.00)
GFR, Estimated: >60 mL/min (ref >60)
GFR, Estimated: >60 mL/min (ref >60)
Glucose, Bld: 257 mg/dL — ABNORMAL HIGH (ref 70–99)
Glucose, Bld: 322 mg/dL — ABNORMAL HIGH (ref 70–99)
Potassium: 3.6 mmol/L (ref 3.5–5.1)
Potassium: 4.9 mmol/L (ref 3.5–5.1)
Sodium: 134 mmol/L — ABNORMAL LOW (ref 135–145)
Sodium: 133 mmol/L — ABNORMAL LOW (ref 135–145)

## 2023-08-10 LAB — COOXEMETRY PANEL
Carboxyhemoglobin: 2.9 % — ABNORMAL HIGH (ref 0.5–1.5)
Methemoglobin: 1.7 % — ABNORMAL HIGH (ref 0.0–1.5)
O2 Saturation: 86.3 %
Total hemoglobin: 10.5 g/dL — ABNORMAL LOW (ref 12.0–16.0)

## 2023-08-10 LAB — CBC
HCT: 31.4 % — ABNORMAL LOW (ref 36.0–46.0)
Hemoglobin: 10.1 g/dL — ABNORMAL LOW (ref 12.0–15.0)
MCH: 28.1 pg (ref 26.0–34.0)
MCHC: 32.2 g/dL (ref 30.0–36.0)
MCV: 87.2 fL (ref 80.0–100.0)
Platelets: 191 10*3/uL (ref 150–400)
RBC: 3.6 MIL/uL — ABNORMAL LOW (ref 3.87–5.11)
RDW: 13.3 % (ref 11.5–15.5)
WBC: 9.3 10*3/uL (ref 4.0–10.5)
nRBC: 0 % (ref 0.0–0.2)

## 2023-08-10 LAB — PHOSPHORUS: Phosphorus: 3.2 mg/dL (ref 2.5–4.6)

## 2023-08-10 LAB — HEPARIN LEVEL (UNFRACTIONATED): Heparin Unfractionated: 0.34 [IU]/mL (ref 0.30–0.70)

## 2023-08-10 LAB — MAGNESIUM: Magnesium: 2 mg/dL (ref 1.7–2.4)

## 2023-08-10 MED ORDER — AMIODARONE HCL IN DEXTROSE 360-4.14 MG/200ML-% IV SOLN
30.0000 mg/h | INTRAVENOUS | Status: DC
  Administered 2023-08-10 – 2023-08-22 (×26): 30 mg/h via INTRAVENOUS
  Filled 2023-08-10 (×24): qty 200
  Filled 2023-08-10: qty 400
  Filled 2023-08-10: qty 200

## 2023-08-10 MED ORDER — POTASSIUM CHLORIDE 20 MEQ PO PACK: 40.0000 meq | PACK | Freq: Once | ORAL | Status: DC

## 2023-08-10 MED ORDER — HYDRALAZINE HCL 20 MG/ML IJ SOLN
10.0000 mg | Freq: Four times a day (QID) | INTRAMUSCULAR | Status: DC | PRN
  Administered 2023-08-10: 10 mg via INTRAVENOUS
  Filled 2023-08-10: qty 1

## 2023-08-10 MED ORDER — AMIODARONE LOAD VIA INFUSION
150.0000 mg | Freq: Once | INTRAVENOUS | Status: AC
  Administered 2023-08-10: 150 mg via INTRAVENOUS
  Filled 2023-08-10: qty 83.34

## 2023-08-10 MED ORDER — SORBITOL 70 % SOLN: 30.0000 mL | Freq: Once | Status: DC

## 2023-08-10 MED ORDER — AMIODARONE HCL IN DEXTROSE 360-4.14 MG/200ML-% IV SOLN
INTRAVENOUS | Status: AC
  Administered 2023-08-10: 60 mg/h via INTRAVENOUS
  Filled 2023-08-10: qty 200

## 2023-08-10 MED ORDER — SENNA 8.6 MG PO TABS
1.0000 | ORAL_TABLET | Freq: Two times a day (BID) | ORAL | Status: DC
  Administered 2023-08-10 – 2023-08-11 (×3): 8.6 mg
  Filled 2023-08-10 (×3): qty 1

## 2023-08-10 MED ORDER — POTASSIUM CHLORIDE 20 MEQ PO PACK
60.0000 meq | PACK | ORAL | Status: AC
  Administered 2023-08-10 (×2): 60 meq
  Filled 2023-08-10 (×2): qty 3

## 2023-08-10 MED ORDER — SENNA 8.6 MG PO TABS: 1.0000 | ORAL_TABLET | Freq: Two times a day (BID) | ORAL | Status: DC

## 2023-08-10 MED ORDER — FAMOTIDINE 20 MG PO TABS
40.0000 mg | ORAL_TABLET | Freq: Every day | ORAL | Status: DC
  Administered 2023-08-10 – 2023-08-11 (×2): 40 mg
  Filled 2023-08-10 (×2): qty 2

## 2023-08-10 MED ORDER — FUROSEMIDE 10 MG/ML IJ SOLN
80.0000 mg | Freq: Two times a day (BID) | INTRAMUSCULAR | Status: AC
  Administered 2023-08-10 (×2): 80 mg via INTRAVENOUS
  Filled 2023-08-10 (×2): qty 8

## 2023-08-10 MED ORDER — METOLAZONE 5 MG PO TABS
5.0000 mg | ORAL_TABLET | Freq: Once | ORAL | Status: AC
Start: 2023-08-10 — End: 2023-08-10
  Administered 2023-08-10: 5 mg via ORAL
  Filled 2023-08-10: qty 1

## 2023-08-10 MED ORDER — INSULIN ASPART 100 UNIT/ML IJ SOLN
0.0000 [IU] | INTRAMUSCULAR | Status: DC
  Administered 2023-08-10: 7 [IU] via SUBCUTANEOUS
  Administered 2023-08-10 (×2): 15 [IU] via SUBCUTANEOUS
  Administered 2023-08-10 – 2023-08-11 (×2): 11 [IU] via SUBCUTANEOUS
  Administered 2023-08-11: 7 [IU] via SUBCUTANEOUS
  Administered 2023-08-11: 4 [IU] via SUBCUTANEOUS
  Administered 2023-08-11: 3 [IU] via SUBCUTANEOUS
  Administered 2023-08-11: 4 [IU] via SUBCUTANEOUS
  Administered 2023-08-11: 11 [IU] via SUBCUTANEOUS
  Administered 2023-08-12: 7 [IU] via SUBCUTANEOUS
  Administered 2023-08-12 (×2): 4 [IU] via SUBCUTANEOUS
  Administered 2023-08-12: 7 [IU] via SUBCUTANEOUS
  Administered 2023-08-12: 3 [IU] via SUBCUTANEOUS
  Administered 2023-08-13: 4 [IU] via SUBCUTANEOUS
  Administered 2023-08-13 (×2): 3 [IU] via SUBCUTANEOUS

## 2023-08-10 MED ORDER — AMIODARONE HCL IN DEXTROSE 360-4.14 MG/200ML-% IV SOLN
60.0000 mg/h | INTRAVENOUS | Status: AC
  Administered 2023-08-10: 60 mg/h via INTRAVENOUS
  Filled 2023-08-10: qty 200

## 2023-08-10 MED ORDER — CLEVIDIPINE BUTYRATE 0.5 MG/ML IV EMUL
0.0000 mg/h | INTRAVENOUS | Status: DC
  Administered 2023-08-10: 12 mg/h via INTRAVENOUS
  Administered 2023-08-10: 6 mg/h via INTRAVENOUS
  Administered 2023-08-10 – 2023-08-11 (×2): 2 mg/h via INTRAVENOUS
  Administered 2023-08-11: 16 mg/h via INTRAVENOUS
  Administered 2023-08-11: 12 mg/h via INTRAVENOUS
  Filled 2023-08-10 (×3): qty 100
  Filled 2023-08-10: qty 200
  Filled 2023-08-10 (×2): qty 100

## 2023-08-10 MED ORDER — SORBITOL 70 % SOLN
30.0000 mL | Freq: Once | Status: AC
  Administered 2023-08-10: 30 mL
  Filled 2023-08-10: qty 30

## 2023-08-10 MED ORDER — INSULIN GLARGINE-YFGN 100 UNIT/ML ~~LOC~~ SOLN
12.0000 [IU] | Freq: Two times a day (BID) | SUBCUTANEOUS | Status: DC
  Administered 2023-08-10 – 2023-08-11 (×3): 12 [IU] via SUBCUTANEOUS
  Filled 2023-08-10 (×4): qty 0.12

## 2023-08-10 MED ORDER — AMIODARONE IV BOLUS ONLY 150 MG/100ML
INTRAVENOUS | Status: AC
  Filled 2023-08-10: qty 100

## 2023-08-10 NOTE — Progress Notes (Signed)
 NAME:  Melissa Bray, MRN:  [id number], DOB:  12-04-1960, LOS: 8 ADMISSION DATE:  07/31/2023, CONSULTATION DATE:  5/9 REFERRING MD:  Mitzie Anda, CHIEF COMPLAINT:  respiratory failure   History of Present Illness:  Melissa Bray is a 63 y/o woman with a history of DM, HTN who presented on 5/1 with cellulitis of her LUE and sepsis. At presentation she complained of fevers, generalized weakness, and reduced PO intake. During her evaluation she was diagnosed with GBS bacteremia and mitral valve endocarditis with leaflet perforation. She required dental extractions for dental carries. Plans have been in place for mitral valve repair by TCTS. She was planned for [facility] today; after coming to the cath lab and going through timeout procedure she was moved to the cath lab table. She very quickly got distressed lying back, trying to sit up. She was placed on bipap for respiratory distress. She suddenly went unresponsive during this time without ongoing agitation. No sedation was given. PCCM was consulted in cath lab for emergent management of respiratory failure.    Pertinent  Medical History  MD HTN HLD Breast cancer, s/p left mastectomy  Significant Hospital Events: Including procedures, antibiotic start and stop dates in addition to other pertinent events   5/1 Admitted  5/9 deteriorated due to flash pulmonary edema, intubated, IABP.  Interim History / Subjective:  She denies complaints. No acute events overnight. Labile BP.   Objective    Blood pressure (!) 128/49, pulse (!) 191, temperature 98.1 F (36.7 C), resp. rate 16, height 5\' 5"  (1.651 m), weight 114.9 kg, SpO2 100%. PAP: (21-37)/(16-31) 24/20 CVP:  [4 mmHg-13 mmHg] 6 mmHg CO:  [4.8 L/min-8.6 L/min] 8.6 L/min CI:  [2.2 L/min/m2-3.91 L/min/m2] 3.91 L/min/m2  Vent Mode: CPAP;PSV FiO2 (%):  [40 %] 40 % Set Rate:  [20 bmp-25 bmp] 20 bmp Vt Set:  [450 mL] 450 mL PEEP:  [8 cmH20-10 cmH20] 8 cmH20 Pressure Support:  [12 cmH20] 12 cmH20 Plateau  Pressure:  [22 cmH20-24 cmH20] 22 cmH20   Intake/Output Summary (Last 24 hours) at 08/10/2023 0852 Last data filed at 08/10/2023 0700 Gross per 24 hour  Intake 2564.15 ml  Output 3190 ml  Net -625.85 ml   Filed Weights   08/07/23 1345 08/09/23 0454 08/10/23 0442  Weight: 117.9 kg 123.8 kg 114.9 kg    Examination: General: critically ill appearing woman lying in bed intubated HENT:  [place]/AT, eyes anicteric, ETT in place Lungs: breathing comfortably on 8/5, CTAB.  Cardiovascular: S1S2, RRR, IABP click Abdomen: obese, soft, NT Extremities: no significant edema, both feet cool Neuro: RASS 0, answering questions appropriately     Coox 86% BUN 11 Cr 0.88 WBC 9.3 H/H 10.1/31.4 Blood cultures 5/10: NGTD Trach aspirate culture: few WBC, no organisms seen CXR> pulm edema still, no focal infiltrates   Resolved Hospital Problem list :  Acute encephalopathy due to hypercapnia Hyperkalemia, resolved> wonder if this was due to succinylcholine at intubation Hypomagnesemia  Assessment & Plan:  Acute respiratory failure with hypoxia and hypercapnia Acute pulmonary edema; flashed  due to orthopnea and hypertension -LTVV -SAT & SBT; may be able to extubate to bipap today with IABP in place. Need BP controlled for this. -VAP prevention protocol -PAD protocol for sedation -more aggressive diuresis today- metolazone and lasix -con't broadened antibiotics- zosyn; follow cultures  MR due to MV endocarditis Acute HFpEF due to severe MR -Appreciate ID's management -appreciate AHF's management; heparin & IABP 1:1. Needs to keep IABP in place until extubated at least; may keep  for OR -Needs to be more compensated before surgery, timing of surgery TBD  Hyponatremia -diuresis, avoid hypotonic fluids  Hyperglycemia, DM - increase glargine 12 units BID -con't tube feeding coverage 3 units every 4 hours> TF held for possible extubation -SSI PRN> more resistant SS -goal BG  140-180  Constipation -miralax, senna, sorbitol -soap suds enema later if that doesn't work  Anemia -transfuse for Hb <7 or hemodynamically significant bleeding   Best Practice (right click and "Reselect all SmartList Selections" daily)   Diet/type: tubefeeds DVT prophylaxis systemic heparin Pressure ulcer(s): N/A GI prophylaxis: PPI Lines: Central line, Aline Foley:  Yes, and it is still needed Code Status:  full code Last date of multidisciplinary goals of care discussion [family updated at bedside PM on 5/9]  Labs   CBC: Recent Labs  Lab 08/06/23 1046 08/07/23 0421 08/08/23 0322 08/08/23 0956 08/08/23 1634 08/09/23 0038 08/09/23 0250 08/09/23 1636 08/10/23 0429  WBC 6.6 5.7 6.0  --   --   --  16.0*  --  9.3  HGB 11.7* 10.9* 11.7*   < > 13.6 12.6 12.8 10.2* 10.1*  HCT 35.0* 33.3* 36.1   < > 40.0 37.0 38.6 30.0* 31.4*  MCV 84.1 85.8 86.8  --   --   --  85.2  --  87.2  PLT 235 218 241  --   --   --  302  --  191   < > = values in this interval not displayed.    Basic Metabolic Panel: Recent Labs  Lab 08/05/23 0419 08/06/23 1046 08/07/23 0421 08/08/23 0322 08/08/23 2252 08/09/23 0038 08/09/23 0250 08/09/23 1015 08/09/23 1550 08/09/23 1636 08/10/23 0429  NA  --  134* 135   < > 137   < > 137 134* 135 135 134*  K  --  4.2 4.2   < > 3.9   < > 3.9 4.5 3.7 3.6 3.6  CL  --  100 102   < > 101  --  100 98 100  --  97*  CO2  --  25 26   < > 25  --  25 24 24   --  27  GLUCOSE  --  236* 178*   < > 183*  --  154* 287* 262*  --  257*  BUN  --  8 7*   < > 13  --  13 13 12   --  11  CREATININE  --  0.67 0.71   < > 1.15*  --  1.11* 1.01* 0.97  --  0.88  CALCIUM  --  9.2 8.9   < > 9.2  --  9.3 8.9 8.3*  --  8.5*  MG 1.5* 1.7 2.1  --   --   --  1.6*  --   --   --  2.0  PHOS  --   --   --   --   --   --   --  3.1  --   --  3.2   < > = values in this interval not displayed.   GFR: Estimated Creatinine Clearance: 81.8 mL/min (by C-G formula based on SCr of 0.88  mg/dL). Recent Labs  Lab 08/07/23 0421 08/08/23 0322 08/08/23 1120 08/08/23 1836 08/08/23 2118 08/09/23 0250 08/09/23 1027 08/10/23 0429  WBC 5.7 6.0  --   --   --  16.0*  --  9.3  LATICACIDVEN  --   --  2.1* 2.5* 2.6*  --  1.4  --  Critical care time:     This patient is critically ill with multiple organ system failure which requires frequent high complexity decision making, assessment, support, evaluation, and titration of therapies. This was completed through the application of advanced monitoring technologies and extensive interpretation of multiple databases. During this encounter critical care time was devoted to patient care services described in this note for 36 minutes.    Joesph Mussel, DO 08/10/23 9:23 AM [facility]  For contact information, see Amion. If no response to pager, please call PCCM consult pager. After hours, 7PM- 7AM, please call Elink.

## 2023-08-10 NOTE — Plan of Care (Signed)
  Problem: Clinical Measurements: Goal: Ability to maintain clinical measurements within normal limits will improve Outcome: Progressing Goal: Diagnostic test results will improve Outcome: Progressing   Problem: Nutrition: Goal: Adequate nutrition will be maintained Outcome: Progressing   Problem: Cardiovascular: Goal: Vascular access site(s) Level 0-1 will be maintained Outcome: Progressing   Problem: Respiratory: Goal: Ability to maintain a clear airway and adequate ventilation will improve Outcome: Progressing

## 2023-08-10 NOTE — Progress Notes (Addendum)
 Patient ID: Melissa Bray, female   DOB: [birth year], 63 y.o.   MRN: [id number]     Advanced Heart Failure Rounding Note  Cardiologist: Eilleen Grates, MD  Chief Complaint: CHF Subjective:    Patient is now on NE 2, vasopressin 0.04, milrinone 0.25 with IABP 1:1.  I/Os -718 with Lasix 80 mg IV bid.  Creatinine stable 1.11 => 0.88. CXR still has significant pulmonary edema.   She remains on Zosyn, vancomycin  stopped.   Swan: CVP 10 PA 24/18 CI 3.3 SVR 773 Co-ox 86%   Objective:   Weight Range: 114.9 kg Body mass index is 42.15 kg/m.   Vital Signs:   Temp:  [98.1 F (36.7 C)-99 F (37.2 C)] 98.1 F (36.7 C) (05/11 0828) Pulse Rate:  [77-287] 191 (05/11 0828) Resp:  [0-32] 16 (05/11 0828) BP: (128)/(49) 128/49 (05/10 1315) SpO2:  [91 %-100 %] 100 % (05/11 0828) Arterial Line BP: (78-303)/(32-70) 129/55 (05/11 0700) FiO2 (%):  [40 %] 40 % (05/11 0828) Weight:  [114.9 kg] 114.9 kg (05/11 0442) Last BM Date : 08/03/23  Weight change: Filed Weights   08/07/23 1345 08/09/23 0454 08/10/23 0442  Weight: 117.9 kg 123.8 kg 114.9 kg    Intake/Output:   Intake/Output Summary (Last 24 hours) at 08/10/2023 0902 Last data filed at 08/10/2023 0700 Gross per 24 hour  Intake 2457.5 ml  Output 2825 ml  Net -367.5 ml      Physical Exam    General: NAD, intubated Neck: JVP 10 cm, no thyromegaly or thyroid  nodule.  Lungs: Clear to auscultation bilaterally with normal respiratory effort. CV: Nondisplaced PMI.  Heart regular S1/S2, no S3/S4, no murmur.  Trace ankle edema.   Abdomen: Soft, nontender, no hepatosplenomegaly, no distention.  Skin: Intact without lesions or rashes.  Neurologic: Alert and oriented x 3.  Psych: Normal affect. Extremities: No clubbing or cyanosis.  HEENT: Normal.   Telemetry   NSR 70s (personally reviewed)   Labs    CBC Recent Labs    08/09/23 0250 08/09/23 1636 08/10/23 0429  WBC 16.0*  --  9.3  HGB 12.8 10.2* 10.1*  HCT 38.6 30.0*  31.4*  MCV 85.2  --  87.2  PLT 302  --  191   Basic Metabolic Panel Recent Labs    40/98/11 0250 08/09/23 1015 08/09/23 1550 08/09/23 1636 08/10/23 0429  NA 137 134* 135 135 134*  K 3.9 4.5 3.7 3.6 3.6  CL 100 98 100  --  97*  CO2 25 24 24   --  27  GLUCOSE 154* 287* 262*  --  257*  BUN 13 13 12   --  11  CREATININE 1.11* 1.01* 0.97  --  0.88  CALCIUM 9.3 8.9 8.3*  --  8.5*  MG 1.6*  --   --   --  2.0  PHOS  --  3.1  --   --  3.2   Liver Function Tests No results for input(s): "AST", "ALT", "ALKPHOS", "BILITOT", "PROT", "ALBUMIN " in the last 72 hours. No results for input(s): "LIPASE", "AMYLASE" in the last 72 hours. Cardiac Enzymes No results for input(s): "CKTOTAL", "CKMB", "CKMBINDEX", "TROPONINI" in the last 72 hours.  BNP: BNP (last 3 results) No results for input(s): "BNP" in the last 8760 hours.  ProBNP (last 3 results) No results for input(s): "PROBNP" in the last 8760 hours.   D-Dimer No results for input(s): "DDIMER" in the last 72 hours. Hemoglobin A1C No results for input(s): "HGBA1C" in the last 72 hours. Fasting Lipid  Panel No results for input(s): "CHOL", "HDL", "LDLCALC", "TRIG", "CHOLHDL", "LDLDIRECT" in the last 72 hours.  Thyroid  Function Tests No results for input(s): "TSH", "T4TOTAL", "T3FREE", "THYROIDAB" in the last 72 hours.  Invalid input(s): "FREET3"  Other results:   Imaging    No results found.    Medications:     Scheduled Medications:  anastrozole   1 mg Per Tube Daily   Chlorhexidine  Gluconate Cloth  6 each Topical Daily   docusate  100 mg Per Tube BID   feeding supplement (PROSource TF20)  60 mL Per Tube Q4H   fentaNYL  (SUBLIMAZE ) injection  50 mcg Intravenous Once   insulin  aspart  0-15 Units Subcutaneous Q4H   insulin  aspart  3 Units Subcutaneous Q4H   insulin  glargine-yfgn  10 Units Subcutaneous BID   mouth rinse  15 mL Mouth Rinse Q2H   pantoprazole (PROTONIX) IV  40 mg Intravenous Daily   polyethylene glycol   17 g Per Tube Daily   potassium chloride   40 mEq Per Tube Once   pravastatin   20 mg Per Tube Daily   senna  1 tablet Oral BID   sodium chloride  flush  3 mL Intravenous Q12H   sorbitol  30 mL Oral Once   thiamine  100 mg Per Tube Daily    Infusions:  sodium chloride  10 mL/hr at 08/10/23 0700   dexmedetomidine (PRECEDEX) IV infusion Stopped (08/08/23 1617)   feeding supplement (VITAL AF 1.2 CAL) 20 mL/hr at 08/10/23 0700   fentaNYL  infusion INTRAVENOUS 125 mcg/hr (08/10/23 0700)   heparin 1,400 Units/hr (08/10/23 0700)   lactated ringers      And   lactated ringers      milrinone 0.25 mcg/kg/min (08/10/23 0700)   norepinephrine (LEVOPHED) Adult infusion 2 mcg/min (08/10/23 0700)   piperacillin-tazobactam (ZOSYN)  IV 12.5 mL/hr at 08/10/23 0700   vasopressin 0.04 Units/min (08/10/23 0700)    PRN Medications: sodium chloride , acetaminophen  **OR** acetaminophen , dextrose , fentaNYL , melatonin, midazolam , ondansetron  (ZOFRAN ) IV, mouth rinse, sodium chloride  flush   Assessment/Plan   1. Cardiogenic shock: Valvular shock from severe MR.  TEE on 5/5 showed LV EF 65%, normal RV function, severe MR with perforated leaflet and mobile vegetation.  Patient developed flash pulmonary edema 5/9 in setting of marked hypertension.  She was intubated and now on NE 2, vasopressin 0.04, milrinone 0.25. Pressor support coming down,  IABP at 1:1. CI up to 3.3 with good co-ox.  CVP 10 today.  She still has significant pulmonary edema on CXR.   - Lasix 80 mg IV bid x 2 doses + metolazone 2.5 x 1 with K replacement and pm BMET.  - Continue IABP 1:1. Would leave in until after extubation in case she has a BP/afterload spike like she did in the cath lab.  Think ideally IABP would stay in until surgery, but surgery may be not be possible until next week, so if she remains stable after extubation could wean IABP and leave on milrinone + afterload reduction with close monitoring of volume status.   - Continue to wean  pressors (should be able to come off today), not ideal in setting of severe MR. At extubation, watch for hypertension and use clevidipine gtt for afterload reduction as needed.  - Continue milrinone 0.25.     2. Mitral regurgitation: Severe on TEE with perforated anterior leaflet and mitral valve vegetation.  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  - Continue Zosyn.  - Management of shock from mitral regurgitation as above.  - Will  need surgical repair of valve when stabilized, per Dr. Luna Salinas may not be possible this week.  3. Acute hypercarbic/hypoxemic respiratory failure. Primarily pulmonary edema, still with pulmonary edema on CXR today.  - Continue diuresis as above.  - Repeat CXR in am.  - If she diureses well again today, hopefully extubate today vs tomorrow.    CRITICAL CARE Performed by: Peder Bourdon  Total critical care time: 40 minutes  Critical care time was exclusive of separately billable procedures and treating other patients.  Critical care was necessary to treat or prevent imminent or life-threatening deterioration.  Critical care was time spent personally by me on the following activities: development of treatment plan with patient and/or surrogate as well as nursing, discussions with consultants, evaluation of patient's response to treatment, examination of patient, obtaining history from patient or surrogate, ordering and performing treatments and interventions, ordering and review of laboratory studies, ordering and review of radiographic studies, pulse oximetry and re-evaluation of patient's condition.   Length of Stay: 8  Peder Bourdon, MD  08/10/2023, 9:02 AM  Advanced Heart Failure Team Pager [phone] (M-F; 7a - 5p)  Please contact [facility] for night-coverage after hours (5p -7a ) and weekends on [email or web address]

## 2023-08-10 NOTE — Progress Notes (Signed)
 PHARMACY - ANTICOAGULATION CONSULT NOTE  Pharmacy Consult for heparin Indication: IABP  No Known Allergies  Patient Measurements: Height: 5\' 5"  (165.1 cm) Weight: 114.9 kg (253 lb 4.9 oz) IBW/kg (Calculated) : 57 HEPARIN DW (KG): 84.3  Vital Signs: Temp: 98.1 F (36.7 C) (05/11 0828) Pulse Rate: 191 (05/11 0828)  Labs: Recent Labs    08/08/23 0322 08/08/23 0956 08/09/23 0250 08/09/23 1015 08/09/23 1550 08/09/23 1636 08/09/23 1813 08/10/23 0429  HGB 11.7*   < > 12.8  --   --  10.2*  --  10.1*  HCT 36.1   < > 38.6  --   --  30.0*  --  31.4*  PLT 241  --  302  --   --   --   --  191  HEPARINUNFRC  --    < > 0.10* 0.15*  --   --  0.32 0.34  CREATININE 0.61   < > 1.11* 1.01* 0.97  --   --  0.88   < > = values in this interval not displayed.    Estimated Creatinine Clearance: 81.8 mL/min (by C-G formula based on SCr of 0.88 mg/dL).   Medical History: Past Medical History:  Diagnosis Date   Anxiety    Breast cancer (HCC)    Diabetes mellitus without complication (HCC)    Hyperlipidemia    Hypertension    Type 2 diabetes mellitus without retinopathy (HCC) 06/17/2017    Medications:  Scheduled:   anastrozole   1 mg Per Tube Daily   Chlorhexidine  Gluconate Cloth  6 each Topical Daily   docusate  100 mg Per Tube BID   feeding supplement (PROSource TF20)  60 mL Per Tube Q4H   fentaNYL  (SUBLIMAZE ) injection  50 mcg Intravenous Once   insulin  aspart  0-15 Units Subcutaneous Q4H   insulin  aspart  3 Units Subcutaneous Q4H   insulin  glargine-yfgn  10 Units Subcutaneous BID   mouth rinse  15 mL Mouth Rinse Q2H   pantoprazole (PROTONIX) IV  40 mg Intravenous Daily   polyethylene glycol  17 g Per Tube Daily   potassium chloride   40 mEq Per Tube Once   pravastatin   20 mg Per Tube Daily   sodium chloride  flush  3 mL Intravenous Q12H   thiamine  100 mg Per Tube Daily    Assessment: 20 yof presenting with acute mitral valve endocarditis with perforated mitral leaflet and  severe MR. Underwent dental extraction 5/8. Presented for pre-op RHC and developed flash pulmonary edema requiring intubation - IABP was placed in lab. No AC PTA. Start heparin for IABP.  Heparin level is therapeutic at 0.34, CBC ok.  Goal of Therapy:  Heparin level 0.2-0.5 units/ml Monitor platelets by anticoagulation protocol: Yes   Plan:  Continue heparin 1400 units/h Daily heparin level and CBC   Levin Reamer, PharmD, [place], [facility] Clinical Pharmacist [phone] Please check AMION for all [facility] Pharmacy numbers 08/10/2023

## 2023-08-11 ENCOUNTER — Encounter (HOSPITAL_COMMUNITY): Payer: Self-pay | Admitting: Certified Registered Nurse Anesthetist

## 2023-08-11 ENCOUNTER — Inpatient Hospital Stay (HOSPITAL_COMMUNITY)

## 2023-08-11 ENCOUNTER — Encounter (HOSPITAL_COMMUNITY)
Admission: EM | Disposition: A | Discharge status: Rehabilitation Facility | Payer: Self-pay | Source: Other Acute Inpatient Hospital | Attending: Thoracic Surgery (Cardiothoracic Vascular Surgery)

## 2023-08-11 DIAGNOSIS — Z01810 Encounter for preprocedural cardiovascular examination: Secondary | ICD-10-CM | POA: Diagnosis not present

## 2023-08-11 DIAGNOSIS — I2699 Other pulmonary embolism without acute cor pulmonale: Secondary | ICD-10-CM | POA: Diagnosis not present

## 2023-08-11 DIAGNOSIS — J9601 Acute respiratory failure with hypoxia: Secondary | ICD-10-CM | POA: Diagnosis not present

## 2023-08-11 DIAGNOSIS — I059 Rheumatic mitral valve disease, unspecified: Secondary | ICD-10-CM | POA: Diagnosis not present

## 2023-08-11 DIAGNOSIS — E1165 Type 2 diabetes mellitus with hyperglycemia: Secondary | ICD-10-CM

## 2023-08-11 DIAGNOSIS — J9602 Acute respiratory failure with hypercapnia: Secondary | ICD-10-CM | POA: Diagnosis not present

## 2023-08-11 LAB — CBC
HCT: 31.2 % — ABNORMAL LOW (ref 36.0–46.0)
Hemoglobin: 9.8 g/dL — ABNORMAL LOW (ref 12.0–15.0)
MCH: 27.8 pg (ref 26.0–34.0)
MCHC: 31.4 g/dL (ref 30.0–36.0)
MCV: 88.6 fL (ref 80.0–100.0)
Platelets: 189 10*3/uL (ref 150–400)
RBC: 3.52 MIL/uL — ABNORMAL LOW (ref 3.87–5.11)
RDW: 13.7 % (ref 11.5–15.5)
WBC: 7.8 10*3/uL (ref 4.0–10.5)
nRBC: 0 % (ref 0.0–0.2)

## 2023-08-11 LAB — HEPARIN LEVEL (UNFRACTIONATED): Heparin Unfractionated: 0.2 [IU]/mL — ABNORMAL LOW (ref 0.30–0.70)

## 2023-08-11 LAB — CULTURE, RESPIRATORY W GRAM STAIN

## 2023-08-11 LAB — GLUCOSE, CAPILLARY
Glucose-Capillary: 302 mg/dL — ABNORMAL HIGH (ref 70–99)
Glucose-Capillary: 275 mg/dL — ABNORMAL HIGH (ref 70–99)
Glucose-Capillary: 219 mg/dL — ABNORMAL HIGH (ref 70–99)
Glucose-Capillary: 177 mg/dL — ABNORMAL HIGH (ref 70–99)
Glucose-Capillary: 299 mg/dL — ABNORMAL HIGH (ref 70–99)
Glucose-Capillary: 169 mg/dL — ABNORMAL HIGH (ref 70–99)
Glucose-Capillary: 188 mg/dL — ABNORMAL HIGH (ref 70–99)

## 2023-08-11 LAB — BASIC METABOLIC PANEL WITH GFR
Anion gap: 11 (ref 5–15)
BUN: 19 mg/dL (ref 8–23)
CO2: 29 mmol/L (ref 22–32)
Calcium: 8.9 mg/dL (ref 8.9–10.3)
Chloride: 95 mmol/L — ABNORMAL LOW (ref 98–111)
Creatinine, Ser: 1.04 mg/dL — ABNORMAL HIGH (ref 0.44–1.00)
GFR, Estimated: >60 mL/min (ref >60)
Glucose, Bld: 276 mg/dL — ABNORMAL HIGH (ref 70–99)
Potassium: 3.7 mmol/L (ref 3.5–5.1)
Sodium: 135 mmol/L (ref 135–145)

## 2023-08-11 LAB — COOXEMETRY PANEL
Carboxyhemoglobin: 1.1 % (ref 0.5–1.5)
Methemoglobin: <0.7 % (ref 0.0–1.5)
O2 Saturation: 83.8 %
Total hemoglobin: 10.8 g/dL — ABNORMAL LOW (ref 12.0–16.0)

## 2023-08-11 LAB — LIPOPROTEIN A (LPA): Lipoprotein (a): 194.1 nmol/L — ABNORMAL HIGH (ref <75.0)

## 2023-08-11 LAB — MAGNESIUM: Magnesium: 1.7 mg/dL (ref 1.7–2.4)

## 2023-08-11 LAB — PHOSPHORUS: Phosphorus: 3.3 mg/dL (ref 2.5–4.6)

## 2023-08-11 SURGERY — REPAIR, MITRAL VALVE
Anesthesia: General

## 2023-08-11 MED ORDER — ANASTROZOLE 1 MG PO TABS
1.0000 mg | ORAL_TABLET | Freq: Every day | ORAL | Status: DC
  Filled 2023-08-11 (×5): qty 1

## 2023-08-11 MED ORDER — POLYETHYLENE GLYCOL 3350 17 G PO PACK: 17.0000 g | PACK | Freq: Every day | ORAL | Status: DC

## 2023-08-11 MED ORDER — POTASSIUM CHLORIDE 20 MEQ PO PACK
60.0000 meq | PACK | Freq: Once | ORAL | Status: AC
  Administered 2023-08-11: 60 meq
  Filled 2023-08-11: qty 3

## 2023-08-11 MED ORDER — NOREPINEPHRINE 4 MG/250ML-% IV SOLN: 0.0000 ug/min | INTRAVENOUS | Status: DC

## 2023-08-11 MED ORDER — SENNA 8.6 MG PO TABS: 1.0000 | ORAL_TABLET | Freq: Two times a day (BID) | ORAL | Status: DC

## 2023-08-11 MED ORDER — PRAVASTATIN SODIUM 40 MG PO TABS: 20.0000 mg | ORAL_TABLET | Freq: Every day | ORAL | Status: DC

## 2023-08-11 MED ORDER — FAMOTIDINE 20 MG PO TABS
40.0000 mg | ORAL_TABLET | Freq: Every day | ORAL | Status: DC
  Filled 2023-08-11: qty 2

## 2023-08-11 MED ORDER — MAGNESIUM SULFATE 4 GM/100ML IV SOLN
4.0000 g | Freq: Once | INTRAVENOUS | Status: AC
  Administered 2023-08-11: 4 g via INTRAVENOUS
  Filled 2023-08-11: qty 100

## 2023-08-11 MED ORDER — FUROSEMIDE 10 MG/ML IJ SOLN: 80.0000 mg | Freq: Two times a day (BID) | INTRAMUSCULAR | Status: DC

## 2023-08-11 MED ORDER — THIAMINE MONONITRATE 100 MG PO TABS: 100.0000 mg | ORAL_TABLET | Freq: Every day | ORAL | Status: DC

## 2023-08-11 MED ORDER — NOREPINEPHRINE 16 MG/250ML-% IV SOLN
0.0000 ug/min | INTRAVENOUS | Status: DC
  Administered 2023-08-12: 0 ug/min via INTRAVENOUS
  Filled 2023-08-11 (×2): qty 250

## 2023-08-11 MED ORDER — FUROSEMIDE 10 MG/ML IJ SOLN
INTRAMUSCULAR | Status: AC
  Filled 2023-08-11: qty 4

## 2023-08-11 MED ORDER — LACTULOSE 10 GM/15ML PO SOLN
30.0000 g | ORAL | Status: DC
  Administered 2023-08-11 (×2): 30 g via ORAL
  Filled 2023-08-11 (×2): qty 45

## 2023-08-11 MED ORDER — LOSARTAN POTASSIUM 25 MG PO TABS
12.5000 mg | ORAL_TABLET | Freq: Every day | ORAL | Status: DC
  Administered 2023-08-11: 12.5 mg via ORAL
  Filled 2023-08-11: qty 1

## 2023-08-11 MED ORDER — SODIUM CHLORIDE 0.9 % IV SOLN
2.0000 g | INTRAVENOUS | Status: DC
  Administered 2023-08-11 – 2023-08-12 (×2): 2 g via INTRAVENOUS
  Filled 2023-08-11 (×2): qty 20

## 2023-08-11 MED ORDER — FUROSEMIDE 10 MG/ML IJ SOLN
80.0000 mg | Freq: Two times a day (BID) | INTRAMUSCULAR | Status: DC
Start: 2023-08-11 — End: 2023-08-11

## 2023-08-11 MED ORDER — FUROSEMIDE 10 MG/ML IJ SOLN
80.0000 mg | Freq: Once | INTRAMUSCULAR | Status: AC
  Administered 2023-08-11: 80 mg via INTRAVENOUS
  Filled 2023-08-11: qty 8

## 2023-08-11 MED ORDER — DOCUSATE SODIUM 100 MG PO CAPS: 100.0000 mg | ORAL_CAPSULE | Freq: Two times a day (BID) | ORAL | Status: DC

## 2023-08-11 MED ORDER — ENSURE ENLIVE PO LIQD: 237.0000 mL | Freq: Three times a day (TID) | ORAL | Status: DC

## 2023-08-11 MED ORDER — INSULIN GLARGINE-YFGN 100 UNIT/ML ~~LOC~~ SOLN
16.0000 [IU] | Freq: Two times a day (BID) | SUBCUTANEOUS | Status: DC
  Administered 2023-08-11 – 2023-08-12 (×2): 16 [IU] via SUBCUTANEOUS
  Filled 2023-08-11 (×3): qty 0.16

## 2023-08-11 MED FILL — Lidocaine IV Infusion in D5W Inj 4 MG/ML: INTRAVENOUS | Qty: 500 | Status: AC

## 2023-08-11 MED FILL — henylephrine-NaCl Pref Syr 0.8 MG/10ML-0.9% (80 MCG/ML): INTRAVENOUS | Qty: 10 | Status: AC

## 2023-08-11 NOTE — Progress Notes (Addendum)
 Patient ID: [name], female   DOB: 1960/06/30, 63 y.o.   MRN: [id number]     Advanced Heart Failure Rounding Note  Cardiologist: Eilleen Grates, MD  Chief Complaint: CHF Subjective:    Co-ox 84%. On milrinone 0.25 mcg/kg/min. IABP 1:1. 4.3L UOP (net negative 1.6L). Weight significantly up, likely inaccurate. Cr stable.  CXR this am with continued pulm edema and atelectasis.   Swan #s:  CVP 5 PAP 22/17 CO/XI 8.8/4  Objective:    Weight Range: 119 kg Body mass index is 43.66 kg/m.   Vital Signs:   Temp:  [97.5 F (36.4 C)-99.5 F (37.5 C)] 98.1 F (36.7 C) (05/12 0830) Pulse Rate:  [102-271] 223 (05/12 0633) Resp:  [14-29] 15 (05/12 0830) BP: (132)/(47-49) 132/49 (05/12 0800) SpO2:  [85 %-100 %] 97 % (05/12 0830) Arterial Line BP: (77-205)/(27-90) 118/44 (05/12 0807) FiO2 (%):  [40 %] 40 % (05/12 0807) Weight:  [409 kg] 119 kg (05/12 0500) Last BM Date : 08/03/23  Weight change: Filed Weights   08/09/23 0454 08/10/23 0442 08/11/23 0500  Weight: 123.8 kg 114.9 kg 119 kg   Intake/Output:  Intake/Output Summary (Last 24 hours) at 08/11/2023 0907 Last data filed at 08/11/2023 0800 Gross per 24 hour  Intake 2444.88 ml  Output 4270 ml  Net -1825.12 ml    Physical Exam    CVP 5 General: Well appearing. No distress on vent Cardiac: JVP difficult to assess. S1 and S2 present. No murmurs or rub. Resp: Lung sounds clear and coarse Extremities: Warm and dry.  No peripheral edema.  Neuro: Alert and oriented x3. Affect pleasant. Intubated Lines/Devices:  LIJ PAC + introducer, foley, R fem IABP + sheath, ETT, OGT  Telemetry   SR 110-120s (personally reviewed)  Labs    CBC Recent Labs    08/10/23 0429 08/11/23 0506  WBC 9.3 7.8  HGB 10.1* 9.8*  HCT 31.4* 31.2*  MCV 87.2 88.6  PLT 191 189   Basic Metabolic Panel Recent Labs    81/19/14 0429 08/10/23 1549 08/11/23 0457  NA 134* 133* 135  K 3.6 4.9 3.7  CL 97* 98 95*  CO2 27 27 29   GLUCOSE 257*  322* 276*  BUN 11 15 19   CREATININE 0.88 0.95 1.04*  CALCIUM 8.5* 8.8* 8.9  MG 2.0  --  1.7  PHOS 3.2  --  3.3   Imaging   No results found.  Medications:    Scheduled Medications:  anastrozole   1 mg Per Tube Daily   Chlorhexidine  Gluconate Cloth  6 each Topical Daily   docusate  100 mg Per Tube BID   famotidine   40 mg Per Tube Daily   feeding supplement (PROSource TF20)  60 mL Per Tube Q4H   insulin  aspart  0-20 Units Subcutaneous Q4H   insulin  aspart  3 Units Subcutaneous Q4H   insulin  glargine-yfgn  12 Units Subcutaneous BID   mouth rinse  15 mL Mouth Rinse Q2H   pantoprazole (PROTONIX) IV  40 mg Intravenous Daily   polyethylene glycol  17 g Per Tube Daily   pravastatin   20 mg Per Tube Daily   senna  1 tablet Per Tube BID   sodium chloride  flush  3 mL Intravenous Q12H   thiamine  100 mg Per Tube Daily   Infusions:  sodium chloride  10 mL/hr at 08/11/23 0800   amiodarone 30 mg/hr (08/11/23 0800)   clevidipine Stopped (08/10/23 2010)   dexmedetomidine (PRECEDEX) IV infusion Stopped (08/08/23 1617)   feeding supplement (  VITAL AF 1.2 CAL) 20 mL/hr at 08/11/23 0800   fentaNYL  infusion INTRAVENOUS 125 mcg/hr (08/11/23 0800)   heparin 1,400 Units/hr (08/11/23 0800)   milrinone 0.25 mcg/kg/min (08/11/23 0801)   norepinephrine (LEVOPHED) Adult infusion Stopped (08/11/23 0649)   piperacillin-tazobactam (ZOSYN)  IV 12.5 mL/hr at 08/11/23 0800   vasopressin Stopped (08/10/23 0907)   PRN Medications: sodium chloride , acetaminophen  **OR** acetaminophen , dextrose , fentaNYL , melatonin, midazolam , ondansetron  (ZOFRAN ) IV, mouth rinse, sodium chloride  flush  Assessment/Plan   1. Cardiogenic shock: Valvular shock from severe MR.  TEE on 5/5 showed LV EF 65%, normal RV function, severe MR with perforated leaflet and mobile vegetation.  Patient developed flash pulmonary edema 5/9 in setting of marked hypertension.  Remains intubated.  - Off pressors. On milrinone 0.25 mcg/kg/min. IABP  at 1:1. CI 4. CVP 5 today. Remains with pulm edema on CXR. - Given Lasix 80 mg IV bid again today.  - Continue IABP 1:1. Site ok. Plan to leave in until extubation. Ideally, IABP would stay in until surgery, will need to discuss timing with TCTS.  2. Mitral regurgitation: Severe on TEE with perforated anterior leaflet and mitral valve vegetation.  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  - Continue Zosyn.  - Management of shock from mitral regurgitation as above.  - Surgical repair of valve needed, timing per TCTS  3. Acute hypercarbic/hypoxemic respiratory failure. Primarily pulmonary edema, still with some pulmonary edema on CXR today, but improving.  - Continue diuresis as above.  - Hopefully extubate today per CCM.   CRITICAL CARE Performed by: [place] Lee  Total critical care time: 10 minutes  Critical care time was exclusive of separately billable procedures and treating other patients.  Critical care was necessary to treat or prevent imminent or life-threatening deterioration.  Critical care was time spent personally by me on the following activities: development of treatment plan with patient and/or surrogate as well as nursing, discussions with consultants, evaluation of patient's response to treatment, examination of patient, obtaining history from patient or surrogate, ordering and performing treatments and interventions, ordering and review of laboratory studies, ordering and review of radiographic studies, pulse oximetry and re-evaluation of patient's condition.  Length of Stay: 33  [place] Lee, NP  08/11/2023, 9:07 AM  Advanced Heart Failure Team Pager [phone] (M-F; 7a - 5p)  Please contact [facility] for night-coverage after hours (5p -7a ) and weekends on [email or web address]  Agree with above.  Awake on vent. Following commands. Remains on IABP 1:1 and milrinone 0.25 PCWP 12. SVR 700 output ok   BP labile off/on cleveprex and NE  General:  Awake on  vent  HEENT: normal + ETT Neck: supple. N+ swan Cor: PMI nondisplaced. Regular rate & rhythm. No rubs, gallops or murmurs. Lungs: clear Abdomen: soft, nontender, nondistended. No hepatosplenomegaly. No bruits or masses. Good bowel sounds. Extremities: no cyanosis, clubbing, rash, edema RFA IABP Neuro: alert & orientedx3, cranial nerves grossly intact. moves all 4 extremities w/o difficulty. Affect pleasant  Hemodynamics optimized. Can likely extubate today (d/w CCM).   IABP currently does not seem to be providing additional benefit but had flash pulmonary edema in lab.   Will d/w TCTS. If timing of MVR is soon will leave IABP in. Otherwise can consider pulling with ongoing milrinone support.   BP too labile to start titrating GDMT.   CRITICAL CARE Performed by: Jules Oar  Total critical care time: 41 minutes  Critical care time was exclusive of separately billable procedures and treating other patients.  Critical care was necessary to treat or prevent imminent or life-threatening deterioration.  Critical care was time spent personally by me (independent of midlevel providers or residents) on the following activities: development of treatment plan with patient and/or surrogate as well as nursing, discussions with consultants, evaluation of patient's response to treatment, examination of patient, obtaining history from patient or surrogate, ordering and performing treatments and interventions, ordering and review of laboratory studies, ordering and review of radiographic studies, pulse oximetry and re-evaluation of patient's condition.  Jules Oar, MD  11:28 AM

## 2023-08-11 NOTE — Progress Notes (Incomplete)
 Nutrition Follow-up  DOCUMENTATION CODES:   Obesity unspecified  INTERVENTION:      NUTRITION DIAGNOSIS:   Inadequate oral intake related to acute illness as evidenced by NPO status.  ***  GOAL:   Patient will meet greater than or equal to 90% of their needs  ***  MONITOR:   Vent status, Labs, Weight trends, TF tolerance  REASON FOR ASSESSMENT:   Consult, Ventilator Enteral/tube feeding initiation and management  ASSESSMENT:   63 yo female admitted with sepsis with cellulitis of LUE, diagnosed with GBS bacteremia and MV endocarditis with consult to TCTS, developed flash pulmonary edema and shock requiring intubation and IABP. PMH includes HTN, HLD, breast cancer s/p left mastectomy, DM, GERD  Extubated  Vital AF 1.2 continues at trickle (20 ml/hr); trickle TF initiated on 5/10  Labs: CBGs 219-310 (goal 140-180) Sodium 135 (wdl) Potassium 3.7 (wdl)  Meds:    NUTRITION - FOCUSED PHYSICAL EXAM:  {RD Focused Exam List:21252}  Diet Order:   Diet Order             Diet NPO time specified  Diet effective now                   EDUCATION NEEDS:   Not appropriate for education at this time  Skin:  Skin Assessment: Reviewed RN Assessment  Last BM:  5/04  Height:   Ht Readings from Last 1 Encounters:  08/10/23 5\' 5"  (1.651 m)    Weight:   Wt Readings from Last 1 Encounters:  08/11/23 119 kg    Ideal Body Weight:     BMI:  Body mass index is 43.66 kg/m.  Estimated Nutritional Needs:   Kcal:  1600-1800 kcals  Protein:  120-140 g  Fluid:  1.6-1.8 L    ***

## 2023-08-11 NOTE — Inpatient Diabetes Management (Signed)
 Inpatient Diabetes Program Recommendations  AACE/ADA: New Consensus Statement on Inpatient Glycemic Control (2015)  Target Ranges:  Prepandial:   less than 140 mg/dL      Peak postprandial:   less than 180 mg/dL (1-2 hours)      Critically ill patients:  140 - 180 mg/dL   Lab Results  Component Value Date   GLUCAP 219 (H) 08/11/2023   HGBA1C 12.8 (H) 07/31/2023    Review of Glycemic Control  Latest Reference Range & Units 08/10/23 08:49 08/10/23 13:34 08/10/23 15:51 08/10/23 19:41 08/10/23 23:01 08/11/23 03:39 08/11/23 08:00  Glucose-Capillary 70 - 99 mg/dL 811 (H) 914 (H) 782 (H) 240 (H) 310 (H) 275 (H) 219 (H)   Diabetes history: DM 2 Outpatient Diabetes medications: Lantus  20 every day, metformin  850 BID, glipizide  10 QAM. Has FS Libre  Current orders for Inpatient glycemic control:  Semglee  12 units bid Novolog  0-20 units q4 Novolog  3 units q4 hours Tube Feed Coverage  Vital AF 20 ml/hour A1c 12.8% this admission Diabetes Coordinator addressed on 5/2  Inpatient Diabetes Program Recommendations:    -   Increase Novolog  Tube Feed coverage to 6 units Q4 hours  Thanks,  Eloise Hake RN, MSN, BC-ADM Inpatient Diabetes Coordinator Team Pager [phone] (8a-5p)

## 2023-08-11 NOTE — Progress Notes (Signed)
 PHARMACY - ANTICOAGULATION CONSULT NOTE  Pharmacy Consult for heparin Indication: IABP  No Known Allergies  Patient Measurements: Height: 5\' 5"  (165.1 cm) Weight: 119 kg (262 lb 5.6 oz) IBW/kg (Calculated) : 57 HEPARIN DW (KG): 84.3  Vital Signs: Temp: 98.1 F (36.7 C) (05/12 0830) Temp Source: Bladder (05/12 0800) BP: 132/49 (05/12 0800) Pulse Rate: 223 (05/12 0633)  Labs: Recent Labs    08/09/23 0250 08/09/23 1015 08/09/23 1636 08/09/23 1813 08/10/23 0429 08/10/23 1549 08/11/23 0457 08/11/23 0506  HGB 12.8  --  10.2*  --  10.1*  --   --  9.8*  HCT 38.6  --  30.0*  --  31.4*  --   --  31.2*  PLT 302  --   --   --  191  --   --  189  HEPARINUNFRC 0.10*   < >  --  0.32 0.34  --  0.20*  --   CREATININE 1.11*   < >  --   --  0.88 0.95 1.04*  --    < > = values in this interval not displayed.    Estimated Creatinine Clearance: 70.6 mL/min (A) (by C-G formula based on SCr of 1.04 mg/dL (H)).   Medical History: Past Medical History:  Diagnosis Date   Anxiety    Breast cancer (HCC)    Diabetes mellitus without complication (HCC)    Hyperlipidemia    Hypertension    Type 2 diabetes mellitus without retinopathy (HCC) 06/17/2017    Medications:  Scheduled:   anastrozole   1 mg Per Tube Daily   Chlorhexidine  Gluconate Cloth  6 each Topical Daily   docusate  100 mg Per Tube BID   famotidine   40 mg Per Tube Daily   feeding supplement (PROSource TF20)  60 mL Per Tube Q4H   furosemide  80 mg Intravenous BID   insulin  aspart  0-20 Units Subcutaneous Q4H   insulin  aspart  3 Units Subcutaneous Q4H   insulin  glargine-yfgn  12 Units Subcutaneous BID   mouth rinse  15 mL Mouth Rinse Q2H   pantoprazole (PROTONIX) IV  40 mg Intravenous Daily   polyethylene glycol  17 g Per Tube Daily   pravastatin   20 mg Per Tube Daily   senna  1 tablet Per Tube BID   sodium chloride  flush  3 mL Intravenous Q12H   thiamine  100 mg Per Tube Daily    Assessment: 70 yof presenting with  acute mitral valve endocarditis with perforated mitral leaflet and severe MR. Underwent dental extraction 5/8. Presented for pre-op RHC and developed flash pulmonary edema requiring intubation - IABP was placed in lab. No AC PTA. Start heparin for IABP.  Heparin level is therapeutic at 0.2 on lower end, CBC ok, no bleeding.  Goal of Therapy:  Heparin level 0.2-0.5 units/ml Monitor platelets by anticoagulation protocol: Yes   Plan:  Increase heparin to 1500 units/h to keep in range Daily heparin level and CBC   Levin Reamer, PharmD, [place], [facility] Clinical Pharmacist [phone] Please check AMION for all [facility] Pharmacy numbers 08/11/2023

## 2023-08-11 NOTE — Progress Notes (Signed)
 [facility]  Date of Admission:  07/31/2023      Total days of antibiotics 9            ASSESSMENT: Melissa Bray is a 63 y.o. female admitted from home with:   Native Mitral Valve Endocarditis, Leaflet Perforation -  Group B Streptococcus Bacteremia -  Blood cultures (+) 5/01, (-) 5/03 - Pending timing for valve replacement. She remains in 1:1 IABP support, to continue until surgery for cardiac optimization. Weaning to extubate today.  -Change zosyn to ceftriaxone   -follow for surgery timeline   Leukocytosis (isolated on 5/09) -  Acutely short of breath laying flat, markedly hypertensive --> became solomnent and required intubation in cath lab with ABG c/w respiratory acidosis. Broadened to zosyn to cover possible aspiration. CXRs have been negative. Unsure if acute aspiration event vs flash pulmonary edema lying flat hypertensive with fragile MV.  Leukocytosis followed multiple dental extractions from 5/8 and likely related to surgery.  -D/W Dr. Zaida Hertz and they are OK to narrow back a bit to Ceftriaxone  for now. Would like to ideally get her back on IV PCN infusion.    PLAN: Change zosyn to ceftriaxone   Weaning to extubate per PCCM  Follow for surgery timeline to attend to valve    Principal Problem:   Mitral valve endocarditis Active Problems:   Essential hypertension   DM2 (diabetes mellitus, type 2) (HCC)   HLD (hyperlipidemia)   Severe sepsis (HCC)   Cellulitis of left upper extremity   Obesity, Class III, BMI 40-49.9 (morbid obesity)   Perforation of leaflet of mitral valve   GAD (generalized anxiety disorder)   History of breast cancer   Hypomagnesemia   Hypokalemia   Hyponatremia   Sinus tachycardia   Cardiogenic shock (HCC)   Shock (HCC)    [START ON 08/12/2023] anastrozole   1 mg Oral Daily   Chlorhexidine  Gluconate Cloth  6 each Topical Daily   docusate sodium  100 mg Oral BID   [START ON 08/12/2023] famotidine   40 mg  Oral Daily   furosemide       insulin  aspart  0-20 Units Subcutaneous Q4H   insulin  aspart  3 Units Subcutaneous Q4H   insulin  glargine-yfgn  16 Units Subcutaneous BID   lactulose  30 g Oral Q2H   mouth rinse  15 mL Mouth Rinse Q2H   pantoprazole (PROTONIX) IV  40 mg Intravenous Daily   [START ON 08/12/2023] polyethylene glycol  17 g Oral Daily   [START ON 08/12/2023] pravastatin   20 mg Oral Daily   senna  1 tablet Oral BID   sodium chloride  flush  3 mL Intravenous Q12H   [START ON 08/12/2023] thiamine  100 mg Oral Daily    SUBJECTIVE: Intubated. Awake and alert, following commands and making eye contact, nodding appropriately   Review of Systems: Review of Systems  Unable to perform ROS: Intubated    No Known Allergies  OBJECTIVE: Vitals:   08/11/23 1300 08/11/23 1315 08/11/23 1330 08/11/23 1345  BP: (!) 126/46     Pulse:      Resp: (!) 22 17 (!) 25 19  Temp: 98.8 F (37.1 C) 98.8 F (37.1 C) 98.8 F (37.1 C) 99 F (37.2 C)  TempSrc:      SpO2: (!) 88% 95% 92% 93%  Weight:      Height:       Body mass index is 43.66 kg/m.  Physical Exam Constitutional:  General: She is not in acute distress.    Appearance: She is ill-appearing.  Cardiovascular:     Rate and Rhythm: Regular rhythm. Tachycardia present.     Heart sounds: Murmur heard.  Pulmonary:     Effort: Pulmonary effort is normal.     Breath sounds: Normal breath sounds.  Abdominal:     General: Bowel sounds are normal. There is no distension.     Palpations: Abdomen is soft.  Musculoskeletal:        General: Normal range of motion.     Cervical back: Normal range of motion.  Skin:    General: Skin is warm and dry.  Neurological:     Mental Status: She is alert.     Lab Results Lab Results  Component Value Date   WBC 7.8 08/11/2023   HGB 9.8 (L) 08/11/2023   HCT 31.2 (L) 08/11/2023   MCV 88.6 08/11/2023   PLT 189 08/11/2023    Lab Results  Component Value Date   CREATININE 1.04 (H)  08/11/2023   BUN 19 08/11/2023   NA 135 08/11/2023   K 3.7 08/11/2023   CL 95 (L) 08/11/2023   CO2 29 08/11/2023    Lab Results  Component Value Date   ALT 13 08/04/2023   AST 17 08/04/2023   ALKPHOS 50 08/04/2023   BILITOT 0.4 08/04/2023     Microbiology: Recent Results (from the past 240 hours)  Culture, blood (Routine X 2) w Reflex to ID Panel     Status: None   Collection Time: 08/02/23 11:55 AM   Specimen: BLOOD RIGHT ARM  Result Value Ref Range Status   Specimen Description   Final    BLOOD RIGHT ARM Performed at [facility], [address] [place], [place] [address]    Special Requests   Final    BOTTLES DRAWN AEROBIC AND ANAEROBIC Blood Culture results may not be optimal due to an inadequate volume of blood received in culture bottles Performed at [facility], [address], [place], [place] [address]    Culture   Final    NO GROWTH 5 DAYS Performed at [facility], [address] [place], [place] [address]    Report Status 08/07/2023 FINAL  Final  Culture, blood (Routine X 2) w Reflex to ID Panel     Status: None   Collection Time: 08/02/23 12:01 PM   Specimen: BLOOD RIGHT HAND  Result Value Ref Range Status   Specimen Description   Final    BLOOD RIGHT HAND Performed at [facility], [address], [place], [place] [address]    Special Requests   Final    BOTTLES DRAWN AEROBIC ONLY Blood Culture results may not be optimal due to an inadequate volume of blood received in culture bottles Performed at [facility], [address] [place], [place] [address]    Culture   Final    NO GROWTH 5 DAYS Performed at [facility], [address], [place], [place] [address]    Report Status 08/07/2023 FINAL  Final  MRSA Next Gen by PCR, Nasal     Status: None   Collection Time: 08/08/23  8:42 PM   Specimen: Nasal Mucosa; Nasal Swab  Result Value Ref Range Status   MRSA by PCR Next Gen NOT DETECTED  NOT DETECTED Final    Comment: (NOTE) The GeneXpert MRSA Assay (FDA approved for NASAL specimens only), is one component  of a comprehensive MRSA colonization surveillance program. It is not intended to diagnose MRSA infection nor to guide or monitor treatment for MRSA infections. Test performance is not FDA approved in patients less than 5 years old. Performed at [facility], [address], [place], [place] [address]   Culture, blood (Routine X 2) w Reflex to ID Panel     Status: None (Preliminary result)   Collection Time: 08/09/23  2:50 AM   Specimen: BLOOD RIGHT HAND  Result Value Ref Range Status   Specimen Description BLOOD RIGHT HAND  Final   Special Requests   Final    BOTTLES DRAWN AEROBIC AND ANAEROBIC Blood Culture adequate volume   Culture   Final    NO GROWTH 2 DAYS Performed at [facility], [address], [place], [place] [address]    Report Status PENDING  Incomplete  Culture, blood (Routine X 2) w Reflex to ID Panel     Status: None (Preliminary result)   Collection Time: 08/09/23  2:50 AM   Specimen: BLOOD RIGHT HAND  Result Value Ref Range Status   Specimen Description BLOOD RIGHT HAND  Final   Special Requests   Final    BOTTLES DRAWN AEROBIC AND ANAEROBIC Blood Culture results may not be optimal due to an inadequate volume of blood received in culture bottles   Culture   Final    NO GROWTH 2 DAYS Performed at [facility], [address] [place], [place] [address]    Report Status PENDING  Incomplete  MRSA Next Gen by PCR, Nasal     Status: None   Collection Time: 08/09/23 10:16 AM   Specimen: Nasal Mucosa; Nasal Swab  Result Value Ref Range Status   MRSA by PCR Next Gen NOT DETECTED NOT DETECTED Final    Comment: (NOTE) The GeneXpert MRSA Assay (FDA approved for NASAL specimens only), is one component of a comprehensive MRSA colonization surveillance program. It is not intended to diagnose MRSA infection nor to guide or monitor  treatment for MRSA infections. Test performance is not FDA approved in patients less than 51 years old. Performed at [facility], [address] [place], [place] [address]   Culture, Respiratory w Gram Stain     Status: None (Preliminary result)   Collection Time: 08/09/23  2:03 PM   Specimen: Tracheal Aspirate; Respiratory  Result Value Ref Range Status   Specimen Description TRACHEAL ASPIRATE  Final   Special Requests NONE  Final   Gram Stain RARE WBC SEEN NO ORGANISMS SEEN   Final   Culture   Final    NO GROWTH < 24 HOURS Performed at [facility], [address] [place], [place] [address]    Report Status PENDING  Incomplete    Gibson Kurtz, MSN, NP-C [facility]  [email or web address] Pager: [phone] Office: [phone] [facility] Main Line: [phone] *Secure Chat Communication Welcome

## 2023-08-11 NOTE — Progress Notes (Addendum)
 VASCULAR LAB    Carotid duplex has been performed. Patient on ballon pump and has line in left IJ  See CV proc for preliminary results.   Okie Jansson, RVT 08/11/2023, 11:47 AM

## 2023-08-11 NOTE — Progress Notes (Signed)
 NAME:  Melissa Bray, MRN:  [id number], DOB:  [birth year], LOS: 9 ADMISSION DATE:  07/31/2023, CONSULTATION DATE:  5/9 REFERRING MD:  Mitzie Anda, CHIEF COMPLAINT:  respiratory failure   History of Present Illness:  Ms. Vonstein is a 63 y/o woman with a history of DM, HTN who presented on 5/1 with cellulitis of her LUE and sepsis. At presentation she complained of fevers, generalized weakness, and reduced PO intake. During her evaluation she was diagnosed with GBS bacteremia and mitral valve endocarditis with leaflet perforation. She required dental extractions for dental carries. Plans have been in place for mitral valve repair by TCTS. She was planned for [facility] today; after coming to the cath lab and going through timeout procedure she was moved to the cath lab table. She very quickly got distressed lying back, trying to sit up. She was placed on bipap for respiratory distress. She suddenly went unresponsive during this time without ongoing agitation. No sedation was given. PCCM was consulted in cath lab for emergent management of respiratory failure.    Pertinent  Medical History  MD HTN HLD Breast cancer, s/p left mastectomy  Significant Hospital Events: Including procedures, antibiotic start and stop dates in addition to other pertinent events   5/1 Admitted  5/9 deteriorated due to flash pulmonary edema, intubated, IABP.  Interim History / Subjective:  No overnight issues Vasopressors were titrated off this morning, still requiring milrinone at 0.25 Tolerating spontaneous breathing trial Remain hyperglycemic  Objective    Blood pressure (!) 126/49, pulse (!) 223, temperature 98.1 F (36.7 C), resp. rate 16, height 5\' 5"  (1.651 m), weight 119 kg, SpO2 96%. PAP: (20-36)/(13-33) 24/19 CVP:  [5 mmHg-15 mmHg] 7 mmHg PCWP:  [20 mmHg] 20 mmHg CO:  [7.2 L/min-9.3 L/min] 8.8 L/min CI:  [3.27 L/min/m2-4.22 L/min/m2] 4 L/min/m2  Vent Mode: CPAP;PSV FiO2 (%):  [40 %] 40 % Set Rate:  [20 bmp] 20  bmp Vt Set:  [450 mL] 450 mL PEEP:  [5 cmH20-8 cmH20] 5 cmH20 Pressure Support:  [8 cmH20] 8 cmH20 Plateau Pressure:  [14 cmH20-18 cmH20] 18 cmH20   Intake/Output Summary (Last 24 hours) at 08/11/2023 0958 Last data filed at 08/11/2023 0800 Gross per 24 hour  Intake 2444.52 ml  Output 4270 ml  Net -1825.48 ml   Filed Weights   08/09/23 0454 08/10/23 0442 08/11/23 0500  Weight: 123.8 kg 114.9 kg 119 kg    Examination: General: Crtitically ill-appearing female, orally intubated HEENT: [place]/AT, eyes anicteric.  ETT and OGT in place Neuro: Awake, following commands, moving all 4 extremities Chest: Coarse breath sounds, no wheezes or rhonchi Heart: Regular rhythm, tachycardic, no murmurs or gallops Abdomen: Soft, nondistended, bowel sounds present      Coox 84% on milrinone 0.25, and IABP 1:1 CVP 7 BUN/creatinine 19/1.04 WBC 7.8 H/H 9.8/31 Blood cultures: Strep group A, agalactia on 5 1, 5/10: NGTD I/O net -1.6 L in last 24 hours   Resolved Hospital Problem list :  Acute encephalopathy due to hypercapnia Hyperkalemia, resolved> wonder if this was due to succinylcholine at intubation Hypomagnesemia  Assessment & Plan:  Acute respiratory failure with hypoxia and hypercapnia Acute/pulmonary edema due to hypertension Continue protective ventilation VAP prevention bundle in place PAD protocol with Precedex Patient is tolerating spontaneous breathing trial Pulmonary edema has improved We will try to extubate her  Acute MR due to MV endocarditis Acute HFpEF due to severe MR leading to cardiogenic shock Infectious disease following, appreciate their follow-up Recommend switching Zosyn to ceftriaxone  Continue milrinone  Continue IABP, currently on 1: 1 Will need mitral valve surgery during this admission Monitor intake and output Continue diuresis  Hypervolemic hyponatremia, corrected  Diabetes type 2, poorly controlled with hyperglycemia Blood sugars are not  well-controlled Increase Lantus  to 16 units twice daily Continue sliding scale and tube feed coverage with CBG goal 140-180 Hemoglobin A1c is 12.8  Constipation Continue aggressive bowel regimen  Anemia of critical illness Monitor H&H and transfuse if if less than 7   Best Practice (right click and "Reselect all SmartList Selections" daily)   Diet/type: tubefeeds DVT prophylaxis systemic heparin Pressure ulcer(s): N/A GI prophylaxis: PPI Lines: Central line, Aline Foley:  Yes, and it is still needed Code Status:  full code Last date of multidisciplinary goals of care discussion [family updated at bedside PM on 5/9]  Labs   CBC: Recent Labs  Lab 08/07/23 0421 08/08/23 0322 08/08/23 0956 08/09/23 0038 08/09/23 0250 08/09/23 1636 08/10/23 0429 08/11/23 0506  WBC 5.7 6.0  --   --  16.0*  --  9.3 7.8  HGB 10.9* 11.7*   < > 12.6 12.8 10.2* 10.1* 9.8*  HCT 33.3* 36.1   < > 37.0 38.6 30.0* 31.4* 31.2*  MCV 85.8 86.8  --   --  85.2  --  87.2 88.6  PLT 218 241  --   --  302  --  191 189   < > = values in this interval not displayed.    Basic Metabolic Panel: Recent Labs  Lab 08/06/23 1046 08/07/23 0421 08/08/23 0322 08/09/23 0250 08/09/23 1015 08/09/23 1550 08/09/23 1636 08/10/23 0429 08/10/23 1549 08/11/23 0457  NA 134* 135   < > 137 134* 135 135 134* 133* 135  K 4.2 4.2   < > 3.9 4.5 3.7 3.6 3.6 4.9 3.7  CL 100 102   < > 100 98 100  --  97* 98 95*  CO2 25 26   < > 25 24 24   --  27 27 29   GLUCOSE 236* 178*   < > 154* 287* 262*  --  257* 322* 276*  BUN 8 7*   < > 13 13 12   --  11 15 19   CREATININE 0.67 0.71   < > 1.11* 1.01* 0.97  --  0.88 0.95 1.04*  CALCIUM 9.2 8.9   < > 9.3 8.9 8.3*  --  8.5* 8.8* 8.9  MG 1.7 2.1  --  1.6*  --   --   --  2.0  --  1.7  PHOS  --   --   --   --  3.1  --   --  3.2  --  3.3   < > = values in this interval not displayed.   GFR: Estimated Creatinine Clearance: 70.6 mL/min (A) (by C-G formula based on SCr of 1.04 mg/dL  (H)). Recent Labs  Lab 08/08/23 0322 08/08/23 1120 08/08/23 1836 08/08/23 2118 08/09/23 0250 08/09/23 1027 08/10/23 0429 08/11/23 0506  WBC 6.0  --   --   --  16.0*  --  9.3 7.8  LATICACIDVEN  --  2.1* 2.5* 2.6*  --  1.4  --   --     The patient is critically ill due to acute respiratory failure with hypoxia and hypercapnia/acute mitral valve infective endocarditis.  Critical care was necessary to treat or prevent imminent or life-threatening deterioration.  Critical care was time spent personally by me on the following activities: development of treatment plan with patient and/or surrogate  as well as nursing, discussions with consultants, evaluation of patient's response to treatment, examination of patient, obtaining history from patient or surrogate, ordering and performing treatments and interventions, ordering and review of laboratory studies, ordering and review of radiographic studies, pulse oximetry, re-evaluation of patient's condition and participation in multidisciplinary rounds.   During this encounter critical care time was devoted to patient care services described in this note for 40 minutes.     Trevor Fudge, MD [facility] See Amion for pager If no response to pager, please call [phone] until 7pm After 7pm, Please call E-link [phone]

## 2023-08-11 NOTE — Procedures (Signed)
 Extubation Procedure Note  Patient Details:   Name: Melissa Bray DOB: Jan 27, 1961 MRN: [id number]   Airway Documentation:    Vent end date: 08/11/23 Vent end time: 1035   Evaluation  O2 sats: stable throughout Complications: No apparent complications Patient did tolerate procedure well. Bilateral Breath Sounds: Clear, Diminished   Yes  Patient extubated to 4lnc. Patient tolerated well. Vitals stable throughout. No complications. RT will continue to monitor.  Electa Grieve 08/11/2023, 10:39 AM

## 2023-08-11 NOTE — Progress Notes (Signed)
 eLink Physician-Brief Progress Note Patient Name: Melissa Bray DOB: 23-Jul-1960 MRN: [id number]   Date of Service  08/11/2023  HPI/Events of Note  Notified of hypotension with BP 87/41, while on amiodarone and milrinone.  She was weaned off levophed and vasopressin.  She was extubated earlier today.   eICU Interventions  Ok to restart levophed.      Intervention Category Intermediate Interventions: Hypotension - evaluation and management  Lanell Pinta 08/11/2023, 7:57 PM

## 2023-08-11 NOTE — Plan of Care (Signed)
  Problem: Education: Goal: Knowledge of General Education information will improve Description: Including pain rating scale, medication(s)/side effects and non-pharmacologic comfort measures Outcome: Progressing   Problem: Health Behavior/Discharge Planning: Goal: Ability to manage health-related needs will improve Outcome: Progressing   Problem: Clinical Measurements: Goal: Ability to maintain clinical measurements within normal limits will improve Outcome: Progressing   Problem: Coping: Goal: Level of anxiety will decrease Outcome: Progressing   Problem: Elimination: Goal: Will not experience complications related to bowel motility Outcome: Progressing Goal: Will not experience complications related to urinary retention Outcome: Progressing   Problem: Pain Managment: Goal: General experience of comfort will improve and/or be controlled Outcome: Progressing

## 2023-08-12 ENCOUNTER — Inpatient Hospital Stay (HOSPITAL_COMMUNITY)

## 2023-08-12 DIAGNOSIS — I059 Rheumatic mitral valve disease, unspecified: Secondary | ICD-10-CM | POA: Diagnosis not present

## 2023-08-12 DIAGNOSIS — R7881 Bacteremia: Secondary | ICD-10-CM | POA: Diagnosis not present

## 2023-08-12 DIAGNOSIS — I2699 Other pulmonary embolism without acute cor pulmonale: Secondary | ICD-10-CM | POA: Diagnosis not present

## 2023-08-12 DIAGNOSIS — J9602 Acute respiratory failure with hypercapnia: Secondary | ICD-10-CM | POA: Diagnosis not present

## 2023-08-12 DIAGNOSIS — B951 Streptococcus, group B, as the cause of diseases classified elsewhere: Secondary | ICD-10-CM | POA: Diagnosis not present

## 2023-08-12 DIAGNOSIS — I339 Acute and subacute endocarditis, unspecified: Secondary | ICD-10-CM | POA: Diagnosis not present

## 2023-08-12 DIAGNOSIS — D72829 Elevated white blood cell count, unspecified: Secondary | ICD-10-CM | POA: Diagnosis not present

## 2023-08-12 DIAGNOSIS — I340 Nonrheumatic mitral (valve) insufficiency: Secondary | ICD-10-CM | POA: Diagnosis not present

## 2023-08-12 DIAGNOSIS — J9601 Acute respiratory failure with hypoxia: Secondary | ICD-10-CM | POA: Diagnosis not present

## 2023-08-12 LAB — CBC
HCT: 29.3 % — ABNORMAL LOW (ref 36.0–46.0)
Hemoglobin: 9.2 g/dL — ABNORMAL LOW (ref 12.0–15.0)
MCH: 27.6 pg (ref 26.0–34.0)
MCHC: 31.4 g/dL (ref 30.0–36.0)
MCV: 88 fL (ref 80.0–100.0)
Platelets: 193 10*3/uL (ref 150–400)
RBC: 3.33 MIL/uL — ABNORMAL LOW (ref 3.87–5.11)
RDW: 13.3 % (ref 11.5–15.5)
WBC: 5.6 10*3/uL (ref 4.0–10.5)
nRBC: 0 % (ref 0.0–0.2)

## 2023-08-12 LAB — BASIC METABOLIC PANEL WITH GFR
Anion gap: 10 (ref 5–15)
Anion gap: 10 (ref 5–15)
BUN: 12 mg/dL (ref 8–23)
BUN: 9 mg/dL (ref 8–23)
CO2: 30 mmol/L (ref 22–32)
CO2: 30 mmol/L (ref 22–32)
Calcium: 9.4 mg/dL (ref 8.9–10.3)
Calcium: 8.9 mg/dL (ref 8.9–10.3)
Chloride: 94 mmol/L — ABNORMAL LOW (ref 98–111)
Chloride: 95 mmol/L — ABNORMAL LOW (ref 98–111)
Creatinine, Ser: 0.94 mg/dL (ref 0.44–1.00)
Creatinine, Ser: 0.94 mg/dL (ref 0.44–1.00)
GFR, Estimated: >60 mL/min (ref >60)
GFR, Estimated: >60 mL/min (ref >60)
Glucose, Bld: 263 mg/dL — ABNORMAL HIGH (ref 70–99)
Glucose, Bld: 134 mg/dL — ABNORMAL HIGH (ref 70–99)
Potassium: 3.4 mmol/L — ABNORMAL LOW (ref 3.5–5.1)
Potassium: 4.1 mmol/L (ref 3.5–5.1)
Sodium: 134 mmol/L — ABNORMAL LOW (ref 135–145)
Sodium: 135 mmol/L (ref 135–145)

## 2023-08-12 LAB — COOXEMETRY PANEL
Carboxyhemoglobin: 2 % — ABNORMAL HIGH (ref 0.5–1.5)
Methemoglobin: <0.7 % (ref 0.0–1.5)
O2 Saturation: 81 %
Total hemoglobin: 10 g/dL — ABNORMAL LOW (ref 12.0–16.0)

## 2023-08-12 LAB — GLUCOSE, CAPILLARY
Glucose-Capillary: 230 mg/dL — ABNORMAL HIGH (ref 70–99)
Glucose-Capillary: 238 mg/dL — ABNORMAL HIGH (ref 70–99)
Glucose-Capillary: 177 mg/dL — ABNORMAL HIGH (ref 70–99)
Glucose-Capillary: 186 mg/dL — ABNORMAL HIGH (ref 70–99)
Glucose-Capillary: 187 mg/dL — ABNORMAL HIGH (ref 70–99)
Glucose-Capillary: 150 mg/dL — ABNORMAL HIGH (ref 70–99)

## 2023-08-12 LAB — TRIGLYCERIDES: Triglycerides: 73 mg/dL (ref <150)

## 2023-08-12 LAB — HEPARIN LEVEL (UNFRACTIONATED): Heparin Unfractionated: 0.24 [IU]/mL — ABNORMAL LOW (ref 0.30–0.70)

## 2023-08-12 LAB — PHOSPHORUS
Phosphorus: 3.7 mg/dL (ref 2.5–4.6)
Phosphorus: 3.1 mg/dL (ref 2.5–4.6)

## 2023-08-12 LAB — MAGNESIUM
Magnesium: 1.9 mg/dL (ref 1.7–2.4)
Magnesium: 2.1 mg/dL (ref 1.7–2.4)

## 2023-08-12 LAB — ABO/RH: ABO/RH(D): O POS

## 2023-08-12 MED ORDER — HEPARIN 30,000 UNITS/1000 ML (OHS) CELLSAVER SOLUTION
Status: DC
  Filled 2023-08-12: qty 1000

## 2023-08-12 MED ORDER — POTASSIUM CHLORIDE 20 MEQ PO PACK
40.0000 meq | PACK | Freq: Once | ORAL | Status: DC
  Filled 2023-08-12: qty 2

## 2023-08-12 MED ORDER — PHENYLEPHRINE HCL-NACL 20-0.9 MG/250ML-% IV SOLN
30.0000 ug/min | INTRAVENOUS | Status: DC
  Filled 2023-08-12: qty 250

## 2023-08-12 MED ORDER — TRANEXAMIC ACID (OHS) PUMP PRIME SOLUTION
2.0000 mg/kg | INTRAVENOUS | Status: DC
  Filled 2023-08-12: qty 2.38

## 2023-08-12 MED ORDER — MAGNESIUM SULFATE 2 GM/50ML IV SOLN
2.0000 g | Freq: Once | INTRAVENOUS | Status: AC
  Administered 2023-08-12: 2 g via INTRAVENOUS
  Filled 2023-08-12: qty 50

## 2023-08-12 MED ORDER — CHLORHEXIDINE GLUCONATE CLOTH 2 % EX PADS
6.0000 | MEDICATED_PAD | Freq: Once | CUTANEOUS | Status: AC
  Administered 2023-08-12: 6 via TOPICAL

## 2023-08-12 MED ORDER — MILRINONE LACTATE IN DEXTROSE 20-5 MG/100ML-% IV SOLN
0.3000 ug/kg/min | INTRAVENOUS | Status: DC
  Filled 2023-08-12: qty 100

## 2023-08-12 MED ORDER — EPINEPHRINE HCL 5 MG/250ML IV SOLN IN NS
0.0000 ug/min | INTRAVENOUS | Status: DC
  Filled 2023-08-12: qty 250

## 2023-08-12 MED ORDER — CHLORHEXIDINE GLUCONATE 0.12 % MT SOLN
15.0000 mL | Freq: Once | OROMUCOSAL | Status: AC
  Administered 2023-08-13: 15 mL via OROMUCOSAL
  Filled 2023-08-12: qty 15

## 2023-08-12 MED ORDER — FUROSEMIDE 10 MG/ML IJ SOLN
80.0000 mg | Freq: Once | INTRAMUSCULAR | Status: AC
  Administered 2023-08-12: 80 mg via INTRAVENOUS
  Filled 2023-08-12 (×2): qty 8

## 2023-08-12 MED ORDER — NITROGLYCERIN IN D5W 200-5 MCG/ML-% IV SOLN
2.0000 ug/min | INTRAVENOUS | Status: DC
  Filled 2023-08-12: qty 250

## 2023-08-12 MED ORDER — CEFAZOLIN SODIUM-DEXTROSE 2-4 GM/100ML-% IV SOLN
2.0000 g | INTRAVENOUS | Status: AC
  Administered 2023-08-13: 2 g via INTRAVENOUS
  Filled 2023-08-12: qty 100

## 2023-08-12 MED ORDER — POTASSIUM CHLORIDE 2 MEQ/ML IV SOLN
80.0000 meq | INTRAVENOUS | Status: DC
  Filled 2023-08-12: qty 40

## 2023-08-12 MED ORDER — POTASSIUM CHLORIDE 10 MEQ/50ML IV SOLN
10.0000 meq | INTRAVENOUS | Status: AC
  Administered 2023-08-12 (×4): 10 meq via INTRAVENOUS
  Filled 2023-08-12 (×4): qty 50

## 2023-08-12 MED ORDER — INSULIN REGULAR(HUMAN) IN NACL 100-0.9 UT/100ML-% IV SOLN
INTRAVENOUS | Status: AC
  Administered 2023-08-13: 4 [IU]/h via INTRAVENOUS
  Administered 2023-08-13: 4.8 [IU]/h via INTRAVENOUS
  Filled 2023-08-12: qty 100

## 2023-08-12 MED ORDER — CHLORHEXIDINE GLUCONATE CLOTH 2 % EX PADS
6.0000 | MEDICATED_PAD | Freq: Once | CUTANEOUS | Status: AC
  Administered 2023-08-13: 6 via TOPICAL

## 2023-08-12 MED ORDER — METOPROLOL TARTRATE 12.5 MG HALF TABLET: 12.5000 mg | ORAL_TABLET | Freq: Once | ORAL | Status: DC

## 2023-08-12 MED ORDER — TRANEXAMIC ACID 1000 MG/10ML IV SOLN
1.5000 mg/kg/h | INTRAVENOUS | Status: AC
  Administered 2023-08-13: 1.5 mg/kg/h via INTRAVENOUS
  Filled 2023-08-12: qty 25

## 2023-08-12 MED ORDER — INSULIN GLARGINE-YFGN 100 UNIT/ML ~~LOC~~ SOLN: 20.0000 [IU] | Freq: Two times a day (BID) | SUBCUTANEOUS | Status: DC

## 2023-08-12 MED ORDER — PLASMA-LYTE A IV SOLN
INTRAVENOUS | Status: DC
  Filled 2023-08-12: qty 2.5

## 2023-08-12 MED ORDER — PANTOPRAZOLE SODIUM 40 MG IV SOLR
40.0000 mg | Freq: Every day | INTRAVENOUS | Status: DC
  Administered 2023-08-12 – 2023-08-13 (×2): 40 mg via INTRAVENOUS
  Filled 2023-08-12 (×2): qty 10

## 2023-08-12 MED ORDER — NOREPINEPHRINE 4 MG/250ML-% IV SOLN
0.0000 ug/min | INTRAVENOUS | Status: AC
  Administered 2023-08-13: 5 ug/min via INTRAVENOUS
  Filled 2023-08-12: qty 250

## 2023-08-12 MED ORDER — INSULIN GLARGINE-YFGN 100 UNIT/ML ~~LOC~~ SOLN
16.0000 [IU] | Freq: Two times a day (BID) | SUBCUTANEOUS | Status: DC
  Administered 2023-08-12 – 2023-08-13 (×2): 16 [IU] via SUBCUTANEOUS
  Filled 2023-08-12 (×3): qty 0.16

## 2023-08-12 MED ORDER — CEFAZOLIN SODIUM-DEXTROSE 2-4 GM/100ML-% IV SOLN
2.0000 g | INTRAVENOUS | Status: AC
Start: 2023-08-13 — End: 2023-08-14
  Administered 2023-08-13: 2 g via INTRAVENOUS
  Filled 2023-08-12: qty 100

## 2023-08-12 MED ORDER — VANCOMYCIN HCL 1.5 G IV SOLR
1500.0000 mg | INTRAVENOUS | Status: AC
  Administered 2023-08-13: 1500 mg via INTRAVENOUS
  Filled 2023-08-12: qty 30

## 2023-08-12 MED ORDER — TRANEXAMIC ACID (OHS) BOLUS VIA INFUSION
15.0000 mg/kg | INTRAVENOUS | Status: AC
  Administered 2023-08-13: 1785 mg via INTRAVENOUS
  Filled 2023-08-12: qty 1785

## 2023-08-12 MED ORDER — MANNITOL 20 % IV SOLN
INTRAVENOUS | Status: DC
  Filled 2023-08-12: qty 13

## 2023-08-12 MED ORDER — DEXMEDETOMIDINE HCL IN NACL 400 MCG/100ML IV SOLN
0.1000 ug/kg/h | INTRAVENOUS | Status: AC
  Administered 2023-08-13: .4 ug/kg/h via INTRAVENOUS
  Filled 2023-08-12: qty 100

## 2023-08-12 NOTE — Progress Notes (Signed)
 NAME:  Melissa Bray, MRN:  [id number], DOB:  Oct 02, 1960, LOS: 10 ADMISSION DATE:  07/31/2023, CONSULTATION DATE:  5/9 REFERRING MD:  Mitzie Anda, CHIEF COMPLAINT:  respiratory failure   History of Present Illness:  Melissa Bray is a 63 y/o woman with a history of DM, HTN who presented on 5/1 with cellulitis of her LUE and sepsis. At presentation she complained of fevers, generalized weakness, and reduced PO intake. During her evaluation she was diagnosed with GBS bacteremia and mitral valve endocarditis with leaflet perforation. She required dental extractions for dental carries. Plans have been in place for mitral valve repair by TCTS. She was planned for [facility] today; after coming to the cath lab and going through timeout procedure she was moved to the cath lab table. She very quickly got distressed lying back, trying to sit up. She was placed on bipap for respiratory distress. She suddenly went unresponsive during this time without ongoing agitation. No sedation was given. PCCM was consulted in cath lab for emergent management of respiratory failure.    Pertinent  Medical History  MD HTN HLD Breast cancer, s/p left mastectomy  Significant Hospital Events: Including procedures, antibiotic start and stop dates in addition to other pertinent events   5/1 Admitted  5/9 deteriorated due to flash pulmonary edema, intubated, IABP. 5/12 Extubated  5/13 Remains off vent, overnight needing levophed gtt restarted but weaning off   Interim History / Subjective:  No issues overnight besides having to restart levophed gtt Patient having no pain or complaints this morning   Objective    Physical Exam  General: acute on chronic adult female, no distress  HEENT: Normocephalic, PERRLA intact, pink mucous membranes CV: s1,s2, RRR, no MRG, No JVD, IABP pulm: clear, diminished, no distress on vent  Abs: bs active, soft  Extremities: non pitting edema, no deformity, moves all extremities on command  Skin: no  rash  Neuro: Rass 0, follows commands, alert and oriented x 4  GU: foley intact, urine yellow   Coox 81 on milirinone 0.25, IABP 1:1 CVP 9 to 10  Cr 0.94, BUN 12   CBC pending  Blood CX: Strep group A, agalactia on 5/1  Coox 84% on milrinone 0.25, and IABP 1:1 CVP 7 BUN/creatinine 19/1.04 WBC 7.8 H/H 9.8/31 Blood cultures: Strep group A, agalactia on 5 1 5/10 NGTD   I/O net -1.13 in last 24 hours   Resolved Hospital Problem list :  Acute encephalopathy due to hypercapnia Hyperkalemia, resolved> wonder if this was due to succinylcholine at intubation Hypomagnesemia  Assessment & Plan:  Acute respiratory failure with hypoxia and hypercapnia Acute/pulmonary edema due to hypertension  Patient extubated on 5/12  P: DC Precedex Patient tolerating well after extubation yesterday on 5/12 - Patient following commands, alert and oriented-no distress Continue VAP bundle Will add incentive spirometer, flutter valve, mobilize when medically appropriate  Acute MR due to MV endocarditis in setting of Strep group A, agalactia bacteremia  Acute HFpEF due to severe MR leading to cardiogenic shock Coox .81, CVP 9-10  Had to restart levophed overnight on 5/12 into 5/13  Blood cultures on 5/10 no growth to date P: Infectious disease following appreciate their assistance and recommendations Ceftriaxone  started on 5/12, switch from Zosyn Continue milrinone infusion Continue heparin gtt infusion  Continue IABP Monitor intake/output  Continue diuresis per heart failure, appreciate assistance Will need eventual mitral valve replacement per TCTS  Currently weaning off on levophed gtt Continue MAP goal >65   Hypervolemic hyponatremia Hypokalemia Na  134 P: Continue to trend sodium daily  Replace potassium via central line Continue to optimize and replace electrolytes as needed especially during diuresis  Diabetes type 2, poorly controlled with hyperglycemia CBG still elevated above  goal on 5/13 Hemoglobin A1c 12.8 P: Continue resistant sliding scale Continue Semglee  16 units twice daily Off tube feeds and currently n.p.o., if CBGs remain high will need to increase long-acting dosage  Constipation P: Continue current bowel regimen  Anemia of critical illness P: CBC pending follow-up with   Best Practice (right click and "Reselect all SmartList Selections" daily)   Diet/type: NPO  DVT prophylaxis systemic heparin Pressure ulcer(s): N/A GI prophylaxis: PPI Lines: Central line, Aline Foley:  Yes, and it is still needed Code Status:  full code Last date of multidisciplinary goals of care discussion-updated patient at beside on 5/13   Rey Catholic AGACNP-BC   [facility] 08/12/2023, 8:32 AM  Please see [email or web address] for pager details.  From 7A-7P if no response, please call [phone]. After hours, please call ELink [phone].

## 2023-08-12 NOTE — Progress Notes (Addendum)
 Patient ID: Melissa Bray, female   DOB: February 22, 1961, 63 y.o.   MRN: [id number]     Advanced Heart Failure Rounding Note  Cardiologist: Eilleen Grates, MD  Chief Complaint: CHF Subjective:    Co-ox 81%. CVP 7-8. On milrinone 0.25 mcg/kg/min. IABP 1:1. Back on NE overnight, currently at 4. 2.8L UOP (net negative 1.1L). Weight inaccurate. CXR this am with b/l pulm edema and R atelectasis  Swan #s:  PAP: (22-55)/(7-28) 37/18 CVP:  [0 mmHg-25 mmHg] 10 mmHg CO:  [8.3 L/min-9.6 L/min] 8.7 L/min CI:  [3.8 L/min/m2-4.3 L/min/m2] 3.9 L/min/m2  Feeling tired this morning. + orthopnea. No cough or SOB when in reverse trendelenburg.  Objective:    Weight Range: 119 kg Body mass index is 43.66 kg/m.   Vital Signs:   Temp:  [97.9 F (36.6 C)-99.9 F (37.7 C)] 98.2 F (36.8 C) (05/13 0800) Pulse Rate:  [98-288] 229 (05/13 0800) Resp:  [12-36] 21 (05/13 0800) BP: (96-126)/(41-82) 119/82 (05/13 0800) SpO2:  [76 %-100 %] 92 % (05/13 0800) Arterial Line BP: (77-171)/(27-69) 119/50 (05/13 0800) Last BM Date : 08/11/23  Weight change: Filed Weights   08/09/23 0454 08/10/23 0442 08/11/23 0500  Weight: 123.8 kg 114.9 kg 119 kg   Intake/Output:  Intake/Output Summary (Last 24 hours) at 08/12/2023 1015 Last data filed at 08/12/2023 0900 Gross per 24 hour  Intake 1536.99 ml  Output 2650 ml  Net -1113.01 ml    Physical Exam    CVP 7-8 General: Drowsy. No distress on Carnation Cardiac: JVP elevated. S1 and S2 present. 3/6 systolic murmurs at LUSB and apex Extremities: Warm and dry.  No peripheral edema. RLE warm Neuro: Alert and oriented x3. Drowsy Lines/Devices:  R fem PAC + art sheath and IABP, Foley  Telemetry   SR in 80s (personally reviewed)  Labs    CBC Recent Labs    08/11/23 0506 08/12/23 0827  WBC 7.8 5.6  HGB 9.8* 9.2*  HCT 31.2* 29.3*  MCV 88.6 88.0  PLT 189 193   Basic Metabolic Panel Recent Labs    95/62/13 0457 08/12/23 0450  NA 135 134*  K 3.7 3.4*  CL 95*  94*  CO2 29 30  GLUCOSE 276* 263*  BUN 19 12  CREATININE 1.04* 0.94  CALCIUM 8.9 9.4  MG 1.7 1.9  PHOS 3.3 3.7   Imaging   DG CHEST PORT 1 VIEW Result Date: 08/12/2023 CLINICAL DATA:  Intra-aortic balloon pump EXAM: PORTABLE CHEST 1 VIEW COMPARISON:  Aug 11, 2023 FINDINGS: No change in the left IJ CVP line Persistent bilateral congestive changes Inferior vena cava catheter via the outflow portion of the right ventricle into the main pulmonary artery. Correlates with the Swan-Ganz catheter pulled back into the main pulmonary artery. No change in the position of the intra-aortic balloon pump marker No change in the heart size. No pneumothorax IMPRESSION: Inferior vena cava catheter via the outflow portion of the right ventricle into the main pulmonary artery. Correlates with the Swan-Ganz catheter pulled back into the main pulmonary artery. Electronically Signed   By: Fredrich Jefferson M.D.   On: 08/12/2023 08:40   DG CHEST PORT 1 VIEW Result Date: 08/11/2023 CLINICAL DATA:  Aspiration, heart failure. EXAM: PORTABLE CHEST 1 VIEW COMPARISON:  08/11/2023 at 0515 hours. FINDINGS: Interval extubation. Left IJ central line tip is in the SVC. IVC Swan-Ganz catheter is in the interlobar pulmonary artery. Intra-aortic balloon pump projects roughly 6.7 cm below the aortic arch. Heart size stable. Mild perihilar interstitial prominence  and indistinctness. Bibasilar volume loss. No definite pleural fluid. IMPRESSION: 1. Mild pulmonary edema. 2. Intra-aortic balloon pump located roughly 6 cm below the aortic arch. Electronically Signed   By: Shearon Denis M.D.   On: 08/11/2023 19:12   VAS US  DOPPLER PRE CABG Result Date: 08/11/2023 PREOPERATIVE VASCULAR EVALUATION Patient Name:  Melissa Bray  Date of Exam:   08/11/2023 Medical Rec #: [id number]      Accession #:    [id number] Date of Birth: [birth year]      Patient Gender: F Patient Age:   58 years Exam Location:  [facility] Procedure:      VAS US  DOPPLER  PRE CABG Referring Phys: Donata Fryer [place] --------------------------------------------------------------------------------  Indications:      Pre-Op Mitral valve replacement. Risk Factors:     Hypertension, hyperlipidemia, Diabetes. Other Factors:    Mitral valve endocarditis. Limitations:      Balloon pump, line in left neck (IJV) Comparison Study: No prior study Performing Technologist: Carleene Chase RVS  Examination Guidelines: A complete evaluation includes B-mode imaging, spectral Doppler, color Doppler, and power Doppler as needed of all accessible portions of each vessel. Bilateral testing is considered an integral part of a complete examination. Limited examinations for reoccurring indications may be performed as noted.  Right Carotid Findings: No plaque or stenosis noted. Patient on Balloon pump, not able to get adequate velocities. Left Carotid Findings: No plaque or stenosis noted in the CCA. Unable to get velocities secondary to balloon pump. Unable to visualize ICA or ECA secondary to IJ line/bandages   Summary: Right Carotid: No plaque or stenosis noted. Patient on Balloon pump, not able to                get adequate velocities. Left Carotid: No plaque or stenosis noted in the CCA. Unable to get velocities               secondary to balloon pump. Unable to visualize ICA or ECA               secondary to IJ line/bandages.  Electronically signed by Irvin Mantel on 08/11/2023 at 5:24:08 PM.    Final     Medications:    Scheduled Medications:  anastrozole   1 mg Oral Daily   Chlorhexidine  Gluconate Cloth  6 each Topical Daily   docusate sodium  100 mg Oral BID   famotidine   40 mg Oral Daily   insulin  aspart  0-20 Units Subcutaneous Q4H   insulin  aspart  3 Units Subcutaneous Q4H   insulin  glargine-yfgn  16 Units Subcutaneous BID   losartan  12.5 mg Oral Daily   pantoprazole (PROTONIX) IV  40 mg Intravenous Daily   polyethylene glycol  17 g Oral Daily   pravastatin   20 mg Oral Daily   senna  1  tablet Oral BID   sodium chloride  flush  3 mL Intravenous Q12H   thiamine  100 mg Oral Daily   Infusions:  sodium chloride  10 mL/hr at 08/12/23 0700   amiodarone 30 mg/hr (08/12/23 0952)   cefTRIAXone  (ROCEPHIN )  IV Stopped (08/11/23 1423)   clevidipine Stopped (08/11/23 1903)   dexmedetomidine (PRECEDEX) IV infusion Stopped (08/12/23 0438)   heparin 1,500 Units/hr (08/12/23 0900)   milrinone 0.25 mcg/kg/min (08/12/23 0900)   norepinephrine (LEVOPHED) Adult infusion 4 mcg/min (08/12/23 0900)   potassium chloride  10 mEq (08/12/23 1008)   vasopressin Stopped (08/10/23 0907)   PRN Medications: sodium chloride , acetaminophen  **OR** acetaminophen , dextrose , melatonin, ondansetron  (ZOFRAN ) IV, mouth  rinse, sodium chloride  flush  Assessment/Plan   1. Cardiogenic shock: Valvular shock from severe MR.  TEE on 5/5 showed LV EF 65%, normal RV function, severe MR with perforated leaflet and mobile vegetation.  Patient developed flash pulmonary edema 5/9 in setting of marked hypertension.  Remains intubated.  - Coox 81. On milrinone 0.25 mcg/kg/min. IABP at 1:1. CI 3.9. Increasing pulm edema and atelectasis on on CXR. - Back on NE overnight. Currently on 4  - Given Lasix 80 mg IV today - Continue IABP 1:1. Site ok. Plan to leave in until MVR tomorrow.   2. Mitral regurgitation: Severe on TEE with perforated anterior leaflet and mitral valve vegetation.  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  - Continue Ceftriaxone  - Management of shock from mitral regurgitation as above.  - MVR tomorrow with Dr. Luna Salinas  3. Acute hypercarbic/hypoxemic respiratory failure. Increasing pulm edema and atelectasis on CXR.  - Continue diuresis as above.  - Needs aggressive IS and pulm toileting - Would recommend Bipap tonight prior to procedure tomorrow to assist with atelectasis  CRITICAL CARE Performed by: [place] Lee  Total critical care time: 11 minutes  Critical care time was  exclusive of separately billable procedures and treating other patients.  Critical care was necessary to treat or prevent imminent or life-threatening deterioration.  Critical care was time spent personally by me on the following activities: development of treatment plan with patient and/or surrogate as well as nursing, discussions with consultants, evaluation of patient's response to treatment, examination of patient, obtaining history from patient or surrogate, ordering and performing treatments and interventions, ordering and review of laboratory studies, ordering and review of radiographic studies, pulse oximetry and re-evaluation of patient's condition.  Length of Stay: 10  [place] Lee, NP  08/12/2023, 10:15 AM  Advanced Heart Failure Team Pager [phone] (M-F; 7a - 5p)  Please contact [facility] for night-coverage after hours (5p -7a ) and weekends on [email or web address]  Agree with above.   Extubated yesterday. Remains weak. Denies SOB.   Remains on IABP 1:1. BP labile. Now back on low-dose NE.   Swan repositioned and san numbers done personally. Output high. PCWP < 10   IABP low on CXR. I placed on standby and advanced.   General:  obese woman lying in bed . No resp difficulty HEENT: normal Neck: supple. JVP hard to see Carotids 2+ bilat; no bruits. No lymphadenopathy or thryomegaly appreciated. Cor:  Regular rate & rhythm. 2/6 MR Lungs: clear Abdomen: obese soft, nontender, nondistended. No hepatosplenomegaly. No bruits or masses. Good bowel sounds. Extremities: no cyanosis, clubbing, rash, edema RFA IABP RFV swan Neuro: alert & orientedx3, cranial nerves grossly intact. moves all 4 extremities w/o difficulty. Affect pleasant  Swan and IABP repositioned. Hemodynamics improved. Responding well to IV diuretics this am.   D/w TCTS plan MVR tomorrow. Leave IABP in place. Continue heparin and abx. (D/w PharmD)   Jules Oar, MD  1:28 PM

## 2023-08-12 NOTE — Inpatient Diabetes Management (Addendum)
 Inpatient Diabetes Program Recommendations  AACE/ADA: New Consensus Statement on Inpatient Glycemic Control (2015)  Target Ranges:  Prepandial:   less than 140 mg/dL      Peak postprandial:   less than 180 mg/dL (1-2 hours)      Critically ill patients:  140 - 180 mg/dL   Lab Results  Component Value Date   GLUCAP 238 (H) 08/12/2023   HGBA1C 12.8 (H) 07/31/2023    Review of Glycemic Control  Latest Reference Range & Units 08/10/23 08:49 08/10/23 13:34 08/10/23 15:51 08/10/23 19:41 08/10/23 23:01 08/11/23 03:39 08/11/23 08:00  Glucose-Capillary 70 - 99 mg/dL 213 (H) 086 (H) 578 (H) 240 (H) 310 (H) 275 (H) 219 (H)    Latest Reference Range & Units 08/11/23 08:00 08/11/23 12:02 08/11/23 16:28 08/11/23 19:40 08/11/23 23:29 08/12/23 03:57 08/12/23 07:31  Glucose-Capillary 70 - 99 mg/dL 469 (H) 629 (H) 528 (H) 169 (H) 188 (H) 230 (H) 238 (H)   Diabetes history: DM 2 Outpatient Diabetes medications: Lantus  20 every day, metformin  850 BID, glipizide  10 QAM. Has FS Libre  Current orders for Inpatient glycemic control:  Semglee  16 units bid Novolog  0-20 units q4  Vital AF 20 ml/hour (held) A1c 12.8% this admission Diabetes Coordinator addressed on 5/2  Inpatient Diabetes Program Recommendations:    Note Semglee  increased to 16 units bid, Tube Feeds d/c'd as pt being evaluated for possible aspiration. Will follow.  Thanks,  Eloise Hake RN, MSN, BC-ADM Inpatient Diabetes Coordinator Team Pager [phone] (8a-5p)

## 2023-08-12 NOTE — Evaluation (Signed)
 Clinical/Bedside Swallow Evaluation Patient Details  Name: Melissa Bray MRN: [id number] Date of Birth: March 01, 1961  Today's Date: 08/12/2023 Time: SLP Start Time (ACUTE ONLY): 1159 SLP Stop Time (ACUTE ONLY): 1216 SLP Time Calculation (min) (ACUTE ONLY): 17 min  Past Medical History:  Past Medical History:  Diagnosis Date   Anxiety    Breast cancer (HCC)    Diabetes mellitus without complication (HCC)    Hyperlipidemia    Hypertension    Type 2 diabetes mellitus without retinopathy (HCC) 06/17/2017   Past Surgical History:  Past Surgical History:  Procedure Laterality Date   ABDOMINAL HYSTERECTOMY     BREAST BIOPSY     IABP INSERTION N/A 08/08/2023   Procedure: IABP Insertion;  Surgeon: [place], Peter M, MD;  Location: [facility] INVASIVE CV LAB;  Service: Cardiovascular;  Laterality: N/A;   MASTECTOMY Left 2016   RIGHT/LEFT HEART CATH AND CORONARY ANGIOGRAPHY N/A 08/08/2023   Procedure: RIGHT/LEFT HEART CATH AND CORONARY ANGIOGRAPHY;  Surgeon: [place], Peter M, MD;  Location: [facility] INVASIVE CV LAB;  Service: Cardiovascular;  Laterality: N/A;   TOOTH EXTRACTION N/A 08/07/2023   Procedure: EXTRACTION TEETH NUMBERS ELEVEN, THIRTEEN, AND FOURTEEN;  Surgeon: Ascencion Lava, DMD;  Location: [facility] OR;  Service: Oral Surgery;  Laterality: N/A;   TRANSESOPHAGEAL ECHOCARDIOGRAM (CATH LAB) N/A 08/04/2023   Procedure: TRANSESOPHAGEAL ECHOCARDIOGRAM;  Surgeon: Hazle Lites, MD;  Location: [facility] INVASIVE CV LAB;  Service: Cardiovascular;  Laterality: N/A;   HPI:  Melissa Bray is a 63 yo female presenting to ED 5/1 with fevers, generalized weakness, and reduced PO intake. Found to have LUE cellulitis and sepsis in addition to GBS bacteremia and mitral valve endocarditis with leaflet perforation. Developed respiratory distress and period of unresponsiveness during cath lab procedure 5/9, requiring intubation. ETT 5/9-5/12. Plan for MVR 5/14. PMH includes DM, HTN, HLD, breast cancer s/p L mastectomy    Assessment / Plan  / Recommendation  Clinical Impression  Pt states her vocal quality has improved significantly after being extubated yesterday. She passed a 3 oz water test without signs clinically concerning for aspiration. Pt took bites of purees and solids with swift and complete oral transit. Frequent eructation and an instance of gagging were noted throughout trials. Almost immediately after the cessation of POs, pt had significant regurgitation of the contents that she had just been fed. RN present in room to assist with suctioning and to monitor pt. Discussed with CCM NP, who will proceed with an esophagram as pt is medically appropriate. Pending further esophageal w/u, recommend she remain NPO. SLP will continue following. SLP Visit Diagnosis: Dysphagia, unspecified (R13.10)    Aspiration Risk  Moderate aspiration risk    Diet Recommendation NPO except meds    Medication Administration: Crushed with puree    Other  Recommendations Recommended Consults: Consider esophageal assessment Oral Care Recommendations: Oral care QID;Oral care prior to ice chip/H20    Recommendations for follow up therapy are one component of a multi-disciplinary discharge planning process, led by the attending physician.  Recommendations may be updated based on patient status, additional functional criteria and insurance authorization.  Follow up Recommendations Other (comment) (TBA)      Assistance Recommended at Discharge    Functional Status Assessment Patient has had a recent decline in their functional status and demonstrates the ability to make significant improvements in function in a reasonable and predictable amount of time.  Frequency and Duration min 2x/week  2 weeks       Prognosis Prognosis for improved oropharyngeal function:  Good      Swallow Study   General HPI: Melissa Bray is a 63 yo female presenting to ED 5/1 with fevers, generalized weakness, and reduced PO intake. Found to have LUE cellulitis and  sepsis in addition to GBS bacteremia and mitral valve endocarditis with leaflet perforation. Developed respiratory distress and period of unresponsiveness during cath lab procedure 5/9, requiring intubation. ETT 5/9-5/12. Plan for MVR 5/14. PMH includes DM, HTN, HLD, breast cancer s/p L mastectomy Type of Study: Bedside Swallow Evaluation Previous Swallow Assessment: none in chart Diet Prior to this Study: NPO Temperature Spikes Noted: No Respiratory Status: Nasal cannula History of Recent Intubation: Yes Total duration of intubation (days): 4 days Date extubated: 08/11/23 Behavior/Cognition: Alert;Cooperative;Pleasant mood Oral Cavity Assessment: Within Functional Limits Oral Care Completed by SLP: No Oral Cavity - Dentition: Missing dentition (s/p extractions of infected dentition) Vision: Functional for self-feeding Self-Feeding Abilities: Needs assist Patient Positioning: Upright in bed Baseline Vocal Quality: Normal Volitional Cough: Strong Volitional Swallow: Able to elicit    Oral/Motor/Sensory Function Overall Oral Motor/Sensory Function: Within functional limits   Ice Chips Ice chips: Not tested   Thin Liquid Thin Liquid: Within functional limits Presentation: Straw    Nectar Thick Nectar Thick Liquid: Not tested   Honey Thick Honey Thick Liquid: Not tested   Puree Puree: Within functional limits Presentation: Spoon   Solid     Solid: Within functional limits      Amil Kale, M.A., CCC-SLP Speech Language Pathology, Acute Rehabilitation Services  Secure Chat preferred [phone]  08/12/2023,1:32 PM

## 2023-08-12 NOTE — Progress Notes (Signed)
 Nutrition Follow-up  DOCUMENTATION CODES:   Obesity unspecified  INTERVENTION:   Recommend Cortrak placement post OR tomorrow with re-initiation of TF. Pt NPO or trickle TF only since 5/09 (5 days) with potential inadequate oral intake prior to this.  Currently no enteral access and remains NPO  NUTRITION DIAGNOSIS:   Inadequate oral intake related to acute illness as evidenced by NPO status.  Continues  GOAL:   Patient will meet greater than or equal to 90% of their needs  Not Met  MONITOR:   Vent status, Labs, Weight trends, TF tolerance  REASON FOR ASSESSMENT:   Consult, Ventilator Enteral/tube feeding initiation and management  ASSESSMENT:   63 yo female admitted with sepsis with cellulitis of LUE, diagnosed with GBS bacteremia and MV endocarditis with consult to TCTS, developed flash pulmonary edema and shock requiring intubation and IABP. PMH includes HTN, HLD, breast cancer s/p left mastectomy, DM, GERD  5/01 Admitted 5/05 TEE: LV EF 65%, normal RV, severe MR with perforated leaflet and mobile vegetation 5/08 Dental Extraction of teeth 11, 14, 30 5/09 Flash pulmonary edema, Intubated, LHC with IABP placed, Code Stroke 5/12 Extubated  IABP remains in place. Noted plan for OR tomorrow for MVR  NPO, noted SLP evaluated today and pt ok from an oropharyngeal standpoint but during eval, after POs stopped, pt with significant regurgitation of everything she was just fed. Recommend Cortrak placement with   Trickle TF initiated on 5/10, remained on trickle TF until extubation on 5/12. Pt with clear inadequacy of po intake since intubation on 5/09 (x 4 days) but with no recorded po intake since 5/04, pt may have been with inadequate nutrition for longer period.   Current wt :119 kg Admit wt: 120.7 kg (5/01) Lowest wt: 117.6 kg (5/06)  Small type 7 BM x 2 yesterday per I/O; prior to this, pt constipated with previous BM on 5/04  UOP 2.9 L in 24 hours, 100 mL  documented this AM  Labs: Sodium 134 (L) Potassium 3.4 (L) Phosphorus 3.7 (wdl) Magnesium  1.9 (wdl) TG 73 (wdl) BUN/Creatinine wdl  Meds:  SS novolog  Semglee    Diet Order:   Diet Order             Diet NPO time specified  Diet effective now                   EDUCATION NEEDS:   Not appropriate for education at this time  Skin:  Skin Assessment: Reviewed RN Assessment  Last BM:  5/12 2 small type 7, otherwise no BM since 5/4  Height:   Ht Readings from Last 1 Encounters:  08/10/23 5\' 5"  (1.651 m)    Weight:   Wt Readings from Last 1 Encounters:  08/11/23 119 kg      BMI:  Body mass index is 43.66 kg/m.  Estimated Nutritional Needs:   Kcal:  1600-1800 kcals  Protein:  120-140 g  Fluid:  1.6-1.8 L    Norvel Beer MS, RDN, LDN, CNSC Registered Dietitian 3 Clinical Nutrition RD Inpatient Contact Info in Amion

## 2023-08-12 NOTE — Progress Notes (Signed)
 [facility]  Date of Admission:  07/31/2023      Total days of antibiotics 10            ASSESSMENT: Melissa Bray is a 63 y.o. female admitted from home with:   Native Mitral Valve Endocarditis, Leaflet Perforation -  Group B Streptococcus Bacteremia -  Blood cultures (+) 5/01, (-) 5/03 - Pending timing for valve replacement. She remains in 1:1 IABP support, to continue until surgery for cardiac optimization. Extubated and doing well though has some orthopnea with laying flat at rest; tolerating trendelenburg OK. Neurologically intact.  -Continue ceftriaxone  to limit extra fluids, likely narrow back to PCN infusion after valve taken care of -follow for surgery timeline --> posted Wednesday for mitral valve repair  Leukocytosis (isolated on 5/09) -  Acutely short of breath laying flat, markedly hypertensive --> became solomnent and required intubation in cath lab with ABG c/w respiratory acidosis. Broadened to zosyn to cover possible aspiration. CXRs have been negative. Unsure if acute aspiration event vs flash pulmonary edema lying flat hypertensive with fragile MV.  Leukocytosis followed multiple dental extractions from 5/8 and likely related to surgery.  - no evidence of any new process  - No changes   Therapeutic Drug Monitoring -  Leukocytosis resolved, expect she may bump after surgery with acute stress event    PLAN: Continue ceftriaxone   Rest of care per primary / TCTS    Principal Problem:   Mitral valve endocarditis Active Problems:   Essential hypertension   DM2 (diabetes mellitus, type 2) (HCC)   HLD (hyperlipidemia)   Severe sepsis (HCC)   Cellulitis of left upper extremity   Obesity, Class III, BMI 40-49.9 (morbid obesity)   Perforation of leaflet of mitral valve   GAD (generalized anxiety disorder)   History of breast cancer   Hypomagnesemia   Hypokalemia   Hyponatremia   Sinus tachycardia   Cardiogenic shock  (HCC)   Shock (HCC)    anastrozole   1 mg Oral Daily   [START ON 08/13/2023] chlorhexidine   15 mL Mouth/Throat Once   Chlorhexidine  Gluconate Cloth  6 each Topical Daily   Chlorhexidine  Gluconate Cloth  6 each Topical Once   And   Chlorhexidine  Gluconate Cloth  6 each Topical Once   docusate sodium  100 mg Oral BID   [START ON 08/13/2023] epinephrine   0-10 mcg/min Intravenous To OR   [START ON 08/13/2023] heparin sodium (porcine) 2,500 Units, papaverine 30 mg in electrolyte-A (PLASMALYTE-A PH 7.4) 500 mL irrigation   Irrigation To OR   insulin  aspart  0-20 Units Subcutaneous Q4H   insulin  glargine-yfgn  16 Units Subcutaneous BID   [START ON 08/13/2023] insulin    Intravenous To OR   [START ON 08/13/2023] [place] Blood Cardioplegia vial (lidocaine /magnesium /mannitol 0.26g-4g-6.4g)   Intracoronary To OR   [START ON 08/13/2023] metoprolol  tartrate  12.5 mg Oral Once   pantoprazole (PROTONIX) IV  40 mg Intravenous Daily   [START ON 08/13/2023] phenylephrine   30-200 mcg/min Intravenous To OR   polyethylene glycol  17 g Oral Daily   [START ON 08/13/2023] potassium chloride   80 mEq Other To OR   pravastatin   20 mg Oral Daily   senna  1 tablet Oral BID   sodium chloride  flush  3 mL Intravenous Q12H   thiamine  100 mg Oral Daily   [START ON 08/13/2023] tranexamic acid  15 mg/kg Intravenous To OR   [START ON 08/13/2023] tranexamic acid  2 mg/kg Intracatheter To OR    SUBJECTIVE: Awake. Doing OK. No new problems. Some SOB with lying flat as she was just repositioned.   Review of Systems: Review of Systems  Constitutional:  Negative for chills and fever.  Respiratory:  Negative for cough.   Gastrointestinal:  Negative for abdominal pain, nausea and vomiting.    No Known Allergies  OBJECTIVE: Vitals:   08/12/23 1145 08/12/23 1200 08/12/23 1215 08/12/23 1230  BP:      Pulse: (!) 193 (!) 204 (!) 131 (!) 170  Resp: 18 17 (!) 27 19  Temp: 98.1 F (36.7 C) 98.2 F (36.8 C) 98.2 F (36.8 C)  98.4 F (36.9 C)  TempSrc:      SpO2: 95% 95% 91% 95%  Weight:      Height:       Body mass index is 43.66 kg/m.  Physical Exam Constitutional:      General: She is not in acute distress.    Appearance: She is ill-appearing.  Cardiovascular:     Rate and Rhythm: Regular rhythm. Tachycardia present.     Heart sounds: Murmur heard.  Pulmonary:     Effort: Pulmonary effort is normal.     Breath sounds: Normal breath sounds.  Abdominal:     General: Bowel sounds are normal. There is no distension.     Palpations: Abdomen is soft.  Musculoskeletal:        General: Normal range of motion.     Cervical back: Normal range of motion.  Skin:    General: Skin is warm and dry.  Neurological:     Mental Status: She is alert.     Lab Results Lab Results  Component Value Date   WBC 5.6 08/12/2023   HGB 9.2 (L) 08/12/2023   HCT 29.3 (L) 08/12/2023   MCV 88.0 08/12/2023   PLT 193 08/12/2023    Lab Results  Component Value Date   CREATININE 0.94 08/12/2023   BUN 12 08/12/2023   NA 134 (L) 08/12/2023   K 3.4 (L) 08/12/2023   CL 94 (L) 08/12/2023   CO2 30 08/12/2023    Lab Results  Component Value Date   ALT 13 08/04/2023   AST 17 08/04/2023   ALKPHOS 50 08/04/2023   BILITOT 0.4 08/04/2023     Microbiology: Recent Results (from the past 240 hours)  MRSA Next Gen by PCR, Nasal     Status: None   Collection Time: 08/08/23  8:42 PM   Specimen: Nasal Mucosa; Nasal Swab  Result Value Ref Range Status   MRSA by PCR Next Gen NOT DETECTED NOT DETECTED Final    Comment: (NOTE) The GeneXpert MRSA Assay (FDA approved for NASAL specimens only), is one component of a comprehensive MRSA colonization surveillance program. It is not intended to diagnose MRSA infection nor to guide or monitor treatment for MRSA infections. Test performance is not FDA approved in patients less than 43 years old. Performed at [facility], [address], [place], [place] [address]    Culture, blood (Routine X 2) w Reflex to ID Panel     Status: None (Preliminary result)   Collection Time: 08/09/23  2:50 AM   Specimen: BLOOD RIGHT HAND  Result Value Ref Range Status   Specimen Description BLOOD RIGHT HAND  Final   Special Requests   Final    BOTTLES DRAWN AEROBIC AND ANAEROBIC Blood Culture adequate volume   Culture   Final    NO GROWTH  3 DAYS Performed at [facility], [address] [place], [place] [address]    Report Status PENDING  Incomplete  Culture, blood (Routine X 2) w Reflex to ID Panel     Status: None (Preliminary result)   Collection Time: 08/09/23  2:50 AM   Specimen: BLOOD RIGHT HAND  Result Value Ref Range Status   Specimen Description BLOOD RIGHT HAND  Final   Special Requests   Final    BOTTLES DRAWN AEROBIC AND ANAEROBIC Blood Culture results may not be optimal due to an inadequate volume of blood received in culture bottles   Culture   Final    NO GROWTH 3 DAYS Performed at [facility], [address], [place], [place] [address]    Report Status PENDING  Incomplete  MRSA Next Gen by PCR, Nasal     Status: None   Collection Time: 08/09/23 10:16 AM   Specimen: Nasal Mucosa; Nasal Swab  Result Value Ref Range Status   MRSA by PCR Next Gen NOT DETECTED NOT DETECTED Final    Comment: (NOTE) The GeneXpert MRSA Assay (FDA approved for NASAL specimens only), is one component of a comprehensive MRSA colonization surveillance program. It is not intended to diagnose MRSA infection nor to guide or monitor treatment for MRSA infections. Test performance is not FDA approved in patients less than 30 years old. Performed at [facility], [address] [place], [place] [address]   Culture, Respiratory w Gram Stain     Status: None   Collection Time: 08/09/23  2:03 PM   Specimen: Tracheal Aspirate; Respiratory  Result Value Ref Range Status   Specimen Description TRACHEAL ASPIRATE  Final   Special Requests NONE  Final   Gram  Stain RARE WBC SEEN NO ORGANISMS SEEN   Final   Culture   Final    NO GROWTH 2 DAYS Performed at [facility], [address], [place], [place] [address]    Report Status 08/11/2023 FINAL  Final    Gibson Kurtz, MSN, NP-C [facility]  [email or web address] Pager: [phone] Office: [phone] [facility] Main Line: [phone] *Secure Chat Communication Welcome

## 2023-08-12 NOTE — H&P (View-Only) (Signed)
 4 Days Post-Op Procedure(s) (LRB): RIGHT/LEFT HEART CATH AND CORONARY ANGIOGRAPHY (N/A) IABP Insertion (N/A) Subjective: No complaints at present  Objective: Vital signs in last 24 hours: Temp:  [97.9 F (36.6 C)-99.5 F (37.5 C)] 98.8 F (37.1 C) (05/13 1545) Pulse Rate:  [98-284] 264 (05/13 1545) Cardiac Rhythm: Sinus tachycardia (05/13 0745) Resp:  [13-30] 18 (05/13 1545) BP: (100-119)/(46-82) 119/82 (05/13 0800) SpO2:  [89 %-100 %] 95 % (05/13 1545) Arterial Line BP: (77-158)/(27-114) 115/47 (05/13 1545)  Hemodynamic parameters for last 24 hours: PAP: (19-58)/(8-28) 26/12 CVP:  [0 mmHg-23 mmHg] 5 mmHg CO:  [8.3 L/min-6384 L/min] 6384 L/min CI:  [3.8 L/min/m2-3.9 L/min/m2] 3.9 L/min/m2  Intake/Output from previous day: 05/12 0701 - 05/13 0700 In: 1747.3 [I.V.:1427.9; NG/GT:80; IV Piggyback:239.4] Out: 2750 [Urine:2750] Intake/Output this shift: Total I/O In: 468.8 [I.V.:302.2; IV Piggyback:166.6] Out: 2300 [Urine:2300]  General appearance: alert, cooperative, and no distress Neurologic: intact Heart: regular rate and rhythm Lungs: clear anteriorly Extremities: IABP in place right groin  Lab Results: Recent Labs    08/11/23 0506 08/12/23 0827  WBC 7.8 5.6  HGB 9.8* 9.2*  HCT 31.2* 29.3*  PLT 189 193   BMET:  Recent Labs    08/11/23 0457 08/12/23 0450  NA 135 134*  K 3.7 3.4*  CL 95* 94*  CO2 29 30  GLUCOSE 276* 263*  BUN 19 12  CREATININE 1.04* 0.94  CALCIUM 8.9 9.4    PT/INR: No results for input(s): "LABPROT", "INR" in the last 72 hours. ABG    Component Value Date/Time   PHART 7.481 (H) 08/09/2023 1636   HCO3 26.1 08/09/2023 1636   TCO2 27 08/09/2023 1636   ACIDBASEDEF 1.0 08/08/2023 1634   O2SAT 81 08/12/2023 0450   CBG (last 3)  Recent Labs    08/12/23 1144 08/12/23 1222 08/12/23 1512  GLUCAP 186* 177* 187*    Assessment/Plan: S/P Procedure(s) (LRB): RIGHT/LEFT HEART CATH AND CORONARY ANGIOGRAPHY (N/A) IABP Insertion  (N/A) Mitral valve endocarditis She was extubated yesterday Currently on 2L Exline with sat 97% CXR mild edema PA 30/20, CI 3.1 on 0.25 mcg/kg/min milrinone D/w Dr. Julane Ny- at this point she is about as optimized as we can hope for and there is a window to proceed with MV surgery.  Plan OR tomorrow  I discussed the general nature of the procedure, including the need for general anesthesia, the incisions to be used, the use of cardiopulmonary bypass, and the use of temporary pacemaker wires and drainage tubes postoperatively with [name].  We discussed the expected hospital stay, overall recovery and short and long term outcomes. I informed her of the indications, risks, benefits and alternatives.   She understands the risks include, but are not limited to death, stroke, MI, DVT/PE, bleeding, possible need for transfusion, infections, cardiac arrhythmias, as well as other organ system dysfunction including respiratory, renal, or GI complications.    We discussed options for valve replacement in the event that is necessary.  As she is < 10 yo, the best option is a mechanical valve, but would require lifelong anticoagulation with warfarin.  Tissue valves are an option but would be at high risk for early PV deterioration.  She  prefers a mechanical valve should replacement be necessary.   LOS: 10 days    [name] 08/12/2023

## 2023-08-12 NOTE — Progress Notes (Signed)
 4 Days Post-Op Procedure(s) (LRB): RIGHT/LEFT HEART CATH AND CORONARY ANGIOGRAPHY (N/A) IABP Insertion (N/A) Subjective: No complaints at present  Objective: Vital signs in last 24 hours: Temp:  [97.9 F (36.6 C)-99.5 F (37.5 C)] 98.8 F (37.1 C) (05/13 1545) Pulse Rate:  [98-284] 264 (05/13 1545) Cardiac Rhythm: Sinus tachycardia (05/13 0745) Resp:  [13-30] 18 (05/13 1545) BP: (100-119)/(46-82) 119/82 (05/13 0800) SpO2:  [89 %-100 %] 95 % (05/13 1545) Arterial Line BP: (77-158)/(27-114) 115/47 (05/13 1545)  Hemodynamic parameters for last 24 hours: PAP: (19-58)/(8-28) 26/12 CVP:  [0 mmHg-23 mmHg] 5 mmHg CO:  [8.3 L/min-6384 L/min] 6384 L/min CI:  [3.8 L/min/m2-3.9 L/min/m2] 3.9 L/min/m2  Intake/Output from previous day: 05/12 0701 - 05/13 0700 In: 1747.3 [I.V.:1427.9; NG/GT:80; IV Piggyback:239.4] Out: 2750 [Urine:2750] Intake/Output this shift: Total I/O In: 468.8 [I.V.:302.2; IV Piggyback:166.6] Out: 2300 [Urine:2300]  General appearance: alert, cooperative, and no distress Neurologic: intact Heart: regular rate and rhythm Lungs: clear anteriorly Extremities: IABP in place right groin  Lab Results: Recent Labs    08/11/23 0506 08/12/23 0827  WBC 7.8 5.6  HGB 9.8* 9.2*  HCT 31.2* 29.3*  PLT 189 193   BMET:  Recent Labs    08/11/23 0457 08/12/23 0450  NA 135 134*  K 3.7 3.4*  CL 95* 94*  CO2 29 30  GLUCOSE 276* 263*  BUN 19 12  CREATININE 1.04* 0.94  CALCIUM 8.9 9.4    PT/INR: No results for input(s): "LABPROT", "INR" in the last 72 hours. ABG    Component Value Date/Time   PHART 7.481 (H) 08/09/2023 1636   HCO3 26.1 08/09/2023 1636   TCO2 27 08/09/2023 1636   ACIDBASEDEF 1.0 08/08/2023 1634   O2SAT 81 08/12/2023 0450   CBG (last 3)  Recent Labs    08/12/23 1144 08/12/23 1222 08/12/23 1512  GLUCAP 186* 177* 187*    Assessment/Plan: S/P Procedure(s) (LRB): RIGHT/LEFT HEART CATH AND CORONARY ANGIOGRAPHY (N/A) IABP Insertion  (N/A) Mitral valve endocarditis She was extubated yesterday Currently on 2L Exline with sat 97% CXR mild edema PA 30/20, CI 3.1 on 0.25 mcg/kg/min milrinone D/w Dr. Julane Ny- at this point she is about as optimized as we can hope for and there is a window to proceed with MV surgery.  Plan OR tomorrow  I discussed the general nature of the procedure, including the need for general anesthesia, the incisions to be used, the use of cardiopulmonary bypass, and the use of temporary pacemaker wires and drainage tubes postoperatively with [name].  We discussed the expected hospital stay, overall recovery and short and long term outcomes. I informed her of the indications, risks, benefits and alternatives.   She understands the risks include, but are not limited to death, stroke, MI, DVT/PE, bleeding, possible need for transfusion, infections, cardiac arrhythmias, as well as other organ system dysfunction including respiratory, renal, or GI complications.    We discussed options for valve replacement in the event that is necessary.  As she is < 10 yo, the best option is a mechanical valve, but would require lifelong anticoagulation with warfarin.  Tissue valves are an option but would be at high risk for early PV deterioration.  She  prefers a mechanical valve should replacement be necessary.   LOS: 10 days    Melissa Bray 08/12/2023

## 2023-08-12 NOTE — Progress Notes (Signed)
 PHARMACY - ANTICOAGULATION CONSULT NOTE  Pharmacy Consult for heparin Indication: IABP  No Known Allergies  Patient Measurements: Height: 5\' 5"  (165.1 cm) Weight: 119 kg (262 lb 5.6 oz) IBW/kg (Calculated) : 57 HEPARIN DW (KG): 84.3  Vital Signs: Temp: 97.9 F (36.6 C) (05/13 1015) Temp Source: Core (05/13 0745) BP: 119/82 (05/13 0800) Pulse Rate: 182 (05/13 1015)  Labs: Recent Labs    08/10/23 0429 08/10/23 1549 08/11/23 0457 08/11/23 0506 08/12/23 0450 08/12/23 0452 08/12/23 0827  HGB 10.1*  --   --  9.8*  --   --  9.2*  HCT 31.4*  --   --  31.2*  --   --  29.3*  PLT 191  --   --  189  --   --  193  HEPARINUNFRC 0.34  --  0.20*  --   --  0.24*  --   CREATININE 0.88 0.95 1.04*  --  0.94  --   --     Estimated Creatinine Clearance: 78.1 mL/min (by C-G formula based on SCr of 0.94 mg/dL).   Medical History: Past Medical History:  Diagnosis Date   Anxiety    Breast cancer (HCC)    Diabetes mellitus without complication (HCC)    Hyperlipidemia    Hypertension    Type 2 diabetes mellitus without retinopathy (HCC) 06/17/2017    Medications:  Scheduled:   anastrozole   1 mg Oral Daily   Chlorhexidine  Gluconate Cloth  6 each Topical Daily   docusate sodium  100 mg Oral BID   famotidine   40 mg Oral Daily   furosemide  80 mg Intravenous Once   insulin  aspart  0-20 Units Subcutaneous Q4H   insulin  aspart  3 Units Subcutaneous Q4H   insulin  glargine-yfgn  16 Units Subcutaneous BID   pantoprazole (PROTONIX) IV  40 mg Intravenous Daily   polyethylene glycol  17 g Oral Daily   potassium chloride   40 mEq Oral Once   pravastatin   20 mg Oral Daily   senna  1 tablet Oral BID   sodium chloride  flush  3 mL Intravenous Q12H   thiamine  100 mg Oral Daily    Assessment: 25 yof presenting with acute mitral valve endocarditis with perforated mitral leaflet and severe MR. Underwent dental extraction 5/8. Presented for pre-op RHC and developed flash pulmonary edema  requiring intubation - IABP was placed in lab. No AC PTA. Start heparin for IABP.  Heparin level is therapeutic at 0.24 on heparin drip 1500 uts/hr, CBC ok, no bleeding.  Goal of Therapy:  Heparin level 0.2-0.5 units/ml Monitor platelets by anticoagulation protocol: Yes   Plan:  Continue heparin to 1500 units/h Daily heparin level and CBC    Hortensia Ma Pharm.D. CPP, BCPS Clinical Pharmacist [phone] 08/12/2023 10:49 AM   Please check AMION for all [facility] Pharmacy numbers 08/12/2023

## 2023-08-13 ENCOUNTER — Inpatient Hospital Stay (HOSPITAL_COMMUNITY)

## 2023-08-13 ENCOUNTER — Inpatient Hospital Stay (HOSPITAL_COMMUNITY)
Admission: EM | Disposition: A | Discharge status: Rehabilitation Facility | Payer: Self-pay | Source: Other Acute Inpatient Hospital | Attending: Thoracic Surgery (Cardiothoracic Vascular Surgery)

## 2023-08-13 ENCOUNTER — Inpatient Hospital Stay (HOSPITAL_COMMUNITY): Payer: Self-pay | Admitting: Anesthesiology

## 2023-08-13 ENCOUNTER — Other Ambulatory Visit: Payer: Self-pay

## 2023-08-13 DIAGNOSIS — J9601 Acute respiratory failure with hypoxia: Secondary | ICD-10-CM | POA: Diagnosis not present

## 2023-08-13 DIAGNOSIS — I340 Nonrheumatic mitral (valve) insufficiency: Secondary | ICD-10-CM

## 2023-08-13 DIAGNOSIS — I2699 Other pulmonary embolism without acute cor pulmonale: Secondary | ICD-10-CM | POA: Diagnosis not present

## 2023-08-13 DIAGNOSIS — I059 Rheumatic mitral valve disease, unspecified: Secondary | ICD-10-CM | POA: Diagnosis not present

## 2023-08-13 DIAGNOSIS — Z952 Presence of prosthetic heart valve: Secondary | ICD-10-CM

## 2023-08-13 DIAGNOSIS — J9602 Acute respiratory failure with hypercapnia: Secondary | ICD-10-CM | POA: Diagnosis not present

## 2023-08-13 HISTORY — PX: MITRAL VALVE REPLACEMENT: SHX147

## 2023-08-13 HISTORY — PX: CLIPPING OF ATRIAL APPENDAGE: SHX5773

## 2023-08-13 HISTORY — PX: INTRAOPERATIVE TRANSESOPHAGEAL ECHOCARDIOGRAM: SHX5062

## 2023-08-13 LAB — POCT I-STAT 7, (LYTES, BLD GAS, ICA,H+H)
Acid-Base Excess: 7 mmol/L — ABNORMAL HIGH (ref 0.0–2.0)
Acid-Base Excess: 5 mmol/L — ABNORMAL HIGH (ref 0.0–2.0)
Acid-Base Excess: 0 mmol/L (ref 0.0–2.0)
Bicarbonate: 28.7 mmol/L — ABNORMAL HIGH (ref 20.0–28.0)
Bicarbonate: 28.7 mmol/L — ABNORMAL HIGH (ref 20.0–28.0)
Bicarbonate: 26 mmol/L (ref 20.0–28.0)
Calcium, Ion: 0.98 mmol/L — ABNORMAL LOW (ref 1.15–1.40)
Calcium, Ion: 1.14 mmol/L — ABNORMAL LOW (ref 1.15–1.40)
Calcium, Ion: 1.3 mmol/L (ref 1.15–1.40)
HCT: 20 % — ABNORMAL LOW (ref 36.0–46.0)
HCT: 25 % — ABNORMAL LOW (ref 36.0–46.0)
HCT: 30 % — ABNORMAL LOW (ref 36.0–46.0)
Hemoglobin: 6.8 g/dL — CL (ref 12.0–15.0)
Hemoglobin: 8.5 g/dL — ABNORMAL LOW (ref 12.0–15.0)
Hemoglobin: 10.2 g/dL — ABNORMAL LOW (ref 12.0–15.0)
O2 Saturation: 100 %
O2 Saturation: 92 %
O2 Saturation: 96 %
Patient temperature: 36
Potassium: 3.9 mmol/L (ref 3.5–5.1)
Potassium: 4.1 mmol/L (ref 3.5–5.1)
Potassium: 3.8 mmol/L (ref 3.5–5.1)
Sodium: 135 mmol/L (ref 135–145)
Sodium: 135 mmol/L (ref 135–145)
Sodium: 137 mmol/L (ref 135–145)
TCO2: 30 mmol/L (ref 22–32)
TCO2: 30 mmol/L (ref 22–32)
TCO2: 27 mmol/L (ref 22–32)
pCO2 arterial: 28.6 mmHg — ABNORMAL LOW (ref 32–48)
pCO2 arterial: 35.6 mmHg (ref 32–48)
pCO2 arterial: 42.8 mmHg (ref 32–48)
pH, Arterial: 7.609 (ref 7.35–7.45)
pH, Arterial: 7.514 — ABNORMAL HIGH (ref 7.35–7.45)
pH, Arterial: 7.386 (ref 7.35–7.45)
pO2, Arterial: 399 mmHg — ABNORMAL HIGH (ref 83–108)
pO2, Arterial: 58 mmHg — ABNORMAL LOW (ref 83–108)
pO2, Arterial: 80 mmHg — ABNORMAL LOW (ref 83–108)

## 2023-08-13 LAB — POCT I-STAT, CHEM 8
BUN: 9 mg/dL (ref 8–23)
BUN: 9 mg/dL (ref 8–23)
BUN: 10 mg/dL (ref 8–23)
BUN: 10 mg/dL (ref 8–23)
BUN: 10 mg/dL (ref 8–23)
Calcium, Ion: 1.17 mmol/L (ref 1.15–1.40)
Calcium, Ion: 1.22 mmol/L (ref 1.15–1.40)
Calcium, Ion: 1.04 mmol/L — ABNORMAL LOW (ref 1.15–1.40)
Calcium, Ion: 1.11 mmol/L — ABNORMAL LOW (ref 1.15–1.40)
Calcium, Ion: 1.14 mmol/L — ABNORMAL LOW (ref 1.15–1.40)
Chloride: 94 mmol/L — ABNORMAL LOW (ref 98–111)
Chloride: 97 mmol/L — ABNORMAL LOW (ref 98–111)
Chloride: 94 mmol/L — ABNORMAL LOW (ref 98–111)
Chloride: 94 mmol/L — ABNORMAL LOW (ref 98–111)
Chloride: 96 mmol/L — ABNORMAL LOW (ref 98–111)
Creatinine, Ser: 0.8 mg/dL (ref 0.44–1.00)
Creatinine, Ser: 0.8 mg/dL (ref 0.44–1.00)
Creatinine, Ser: 0.7 mg/dL (ref 0.44–1.00)
Creatinine, Ser: 0.8 mg/dL (ref 0.44–1.00)
Creatinine, Ser: 0.8 mg/dL (ref 0.44–1.00)
Glucose, Bld: 130 mg/dL — ABNORMAL HIGH (ref 70–99)
Glucose, Bld: 145 mg/dL — ABNORMAL HIGH (ref 70–99)
Glucose, Bld: 157 mg/dL — ABNORMAL HIGH (ref 70–99)
Glucose, Bld: 136 mg/dL — ABNORMAL HIGH (ref 70–99)
Glucose, Bld: 136 mg/dL — ABNORMAL HIGH (ref 70–99)
HCT: 27 % — ABNORMAL LOW (ref 36.0–46.0)
HCT: 29 % — ABNORMAL LOW (ref 36.0–46.0)
HCT: 26 % — ABNORMAL LOW (ref 36.0–46.0)
HCT: 26 % — ABNORMAL LOW (ref 36.0–46.0)
HCT: 27 % — ABNORMAL LOW (ref 36.0–46.0)
Hemoglobin: 9.2 g/dL — ABNORMAL LOW (ref 12.0–15.0)
Hemoglobin: 9.9 g/dL — ABNORMAL LOW (ref 12.0–15.0)
Hemoglobin: 8.8 g/dL — ABNORMAL LOW (ref 12.0–15.0)
Hemoglobin: 8.8 g/dL — ABNORMAL LOW (ref 12.0–15.0)
Hemoglobin: 9.2 g/dL — ABNORMAL LOW (ref 12.0–15.0)
Potassium: 3.6 mmol/L (ref 3.5–5.1)
Potassium: 3.7 mmol/L (ref 3.5–5.1)
Potassium: 4.8 mmol/L (ref 3.5–5.1)
Potassium: 4.7 mmol/L (ref 3.5–5.1)
Potassium: 4.1 mmol/L (ref 3.5–5.1)
Sodium: 133 mmol/L — ABNORMAL LOW (ref 135–145)
Sodium: 135 mmol/L (ref 135–145)
Sodium: 133 mmol/L — ABNORMAL LOW (ref 135–145)
Sodium: 132 mmol/L — ABNORMAL LOW (ref 135–145)
Sodium: 134 mmol/L — ABNORMAL LOW (ref 135–145)
TCO2: 26 mmol/L (ref 22–32)
TCO2: 30 mmol/L (ref 22–32)
TCO2: 30 mmol/L (ref 22–32)
TCO2: 33 mmol/L — ABNORMAL HIGH (ref 22–32)
TCO2: 27 mmol/L (ref 22–32)

## 2023-08-13 LAB — CBC WITH DIFFERENTIAL/PLATELET
Abs Immature Granulocytes: 0.25 10*3/uL — ABNORMAL HIGH (ref 0.00–0.07)
Basophils Absolute: 0 10*3/uL (ref 0.0–0.1)
Basophils Relative: 0 %
Eosinophils Absolute: 0.1 10*3/uL (ref 0.0–0.5)
Eosinophils Relative: 1 %
HCT: 28.4 % — ABNORMAL LOW (ref 36.0–46.0)
Hemoglobin: 9.6 g/dL — ABNORMAL LOW (ref 12.0–15.0)
Immature Granulocytes: 3 %
Lymphocytes Relative: 12 %
Lymphs Abs: 1.2 10*3/uL (ref 0.7–4.0)
MCH: 29.4 pg (ref 26.0–34.0)
MCHC: 33.8 g/dL (ref 30.0–36.0)
MCV: 86.9 fL (ref 80.0–100.0)
Monocytes Absolute: 0.9 10*3/uL (ref 0.1–1.0)
Monocytes Relative: 9 %
Neutro Abs: 7.4 10*3/uL (ref 1.7–7.7)
Neutrophils Relative %: 75 %
Platelets: 105 10*3/uL — ABNORMAL LOW (ref 150–400)
RBC: 3.27 MIL/uL — ABNORMAL LOW (ref 3.87–5.11)
RDW: 13.4 % (ref 11.5–15.5)
WBC: 9.8 10*3/uL (ref 4.0–10.5)
nRBC: 0 % (ref 0.0–0.2)

## 2023-08-13 LAB — CBC
HCT: 31.5 % — ABNORMAL LOW (ref 36.0–46.0)
Hemoglobin: 9.9 g/dL — ABNORMAL LOW (ref 12.0–15.0)
MCH: 27.4 pg (ref 26.0–34.0)
MCHC: 31.4 g/dL (ref 30.0–36.0)
MCV: 87.3 fL (ref 80.0–100.0)
Platelets: 191 10*3/uL (ref 150–400)
RBC: 3.61 MIL/uL — ABNORMAL LOW (ref 3.87–5.11)
RDW: 13.4 % (ref 11.5–15.5)
WBC: 6.5 10*3/uL (ref 4.0–10.5)
nRBC: 0 % (ref 0.0–0.2)

## 2023-08-13 LAB — POCT I-STAT EG7
Acid-Base Excess: 8 mmol/L — ABNORMAL HIGH (ref 0.0–2.0)
Bicarbonate: 31.2 mmol/L — ABNORMAL HIGH (ref 20.0–28.0)
Calcium, Ion: 1.09 mmol/L — ABNORMAL LOW (ref 1.15–1.40)
HCT: 21 % — ABNORMAL LOW (ref 36.0–46.0)
Hemoglobin: 7.1 g/dL — ABNORMAL LOW (ref 12.0–15.0)
O2 Saturation: 69 %
Potassium: 3.7 mmol/L (ref 3.5–5.1)
Sodium: 134 mmol/L — ABNORMAL LOW (ref 135–145)
TCO2: 32 mmol/L (ref 22–32)
pCO2, Ven: 35.7 mmHg — ABNORMAL LOW (ref 44–60)
pH, Ven: 7.55 — ABNORMAL HIGH (ref 7.25–7.43)
pO2, Ven: 31 mmHg — CL (ref 32–45)

## 2023-08-13 LAB — GLUCOSE, CAPILLARY
Glucose-Capillary: 107 mg/dL — ABNORMAL HIGH (ref 70–99)
Glucose-Capillary: 115 mg/dL — ABNORMAL HIGH (ref 70–99)
Glucose-Capillary: 121 mg/dL — ABNORMAL HIGH (ref 70–99)
Glucose-Capillary: 123 mg/dL — ABNORMAL HIGH (ref 70–99)
Glucose-Capillary: 134 mg/dL — ABNORMAL HIGH (ref 70–99)
Glucose-Capillary: 136 mg/dL — ABNORMAL HIGH (ref 70–99)
Glucose-Capillary: 151 mg/dL — ABNORMAL HIGH (ref 70–99)
Glucose-Capillary: 172 mg/dL — ABNORMAL HIGH (ref 70–99)
Glucose-Capillary: 190 mg/dL — ABNORMAL HIGH (ref 70–99)
Glucose-Capillary: 200 mg/dL — ABNORMAL HIGH (ref 70–99)
Glucose-Capillary: 212 mg/dL — ABNORMAL HIGH (ref 70–99)
Glucose-Capillary: 99 mg/dL (ref 70–99)

## 2023-08-13 LAB — PHOSPHORUS: Phosphorus: 3.8 mg/dL (ref 2.5–4.6)

## 2023-08-13 LAB — APTT: aPTT: 51 s — ABNORMAL HIGH (ref 24–36)

## 2023-08-13 LAB — PLATELET COUNT: Platelets: 135 10*3/uL — ABNORMAL LOW (ref 150–400)

## 2023-08-13 LAB — BASIC METABOLIC PANEL WITH GFR
Anion gap: 9 (ref 5–15)
BUN: 8 mg/dL (ref 8–23)
CO2: 30 mmol/L (ref 22–32)
Calcium: 9.1 mg/dL (ref 8.9–10.3)
Chloride: 95 mmol/L — ABNORMAL LOW (ref 98–111)
Creatinine, Ser: 0.94 mg/dL (ref 0.44–1.00)
GFR, Estimated: >60 mL/min (ref >60)
Glucose, Bld: 135 mg/dL — ABNORMAL HIGH (ref 70–99)
Potassium: 3.8 mmol/L (ref 3.5–5.1)
Sodium: 134 mmol/L — ABNORMAL LOW (ref 135–145)

## 2023-08-13 LAB — ECHO INTRAOPERATIVE TEE
Height: 65 in
Weight: 4074.1 [oz_av]

## 2023-08-13 LAB — COOXEMETRY PANEL
Carboxyhemoglobin: 1.6 % — ABNORMAL HIGH (ref 0.5–1.5)
Methemoglobin: <0.7 % (ref 0.0–1.5)
O2 Saturation: 90.2 %
Total hemoglobin: 10.5 g/dL — ABNORMAL LOW (ref 12.0–16.0)

## 2023-08-13 LAB — MAGNESIUM: Magnesium: 2 mg/dL (ref 1.7–2.4)

## 2023-08-13 LAB — PROTIME-INR
INR: 1.3 — ABNORMAL HIGH (ref 0.8–1.2)
Prothrombin Time: 16.2 s — ABNORMAL HIGH (ref 11.4–15.2)

## 2023-08-13 LAB — HEPARIN LEVEL (UNFRACTIONATED): Heparin Unfractionated: 0.35 [IU]/mL (ref 0.30–0.70)

## 2023-08-13 LAB — PREPARE RBC (CROSSMATCH)

## 2023-08-13 LAB — HEMOGLOBIN AND HEMATOCRIT, BLOOD
HCT: 24.9 % — ABNORMAL LOW (ref 36.0–46.0)
Hemoglobin: 8.5 g/dL — ABNORMAL LOW (ref 12.0–15.0)

## 2023-08-13 SURGERY — REPLACEMENT, MITRAL VALVE
Anesthesia: General

## 2023-08-13 MED ORDER — SODIUM CHLORIDE 0.45 % IV SOLN: INTRAVENOUS | Status: AC | PRN

## 2023-08-13 MED ORDER — LIDOCAINE 2% (20 MG/ML) 5 ML SYRINGE
INTRAMUSCULAR | Status: AC
  Filled 2023-08-13: qty 5

## 2023-08-13 MED ORDER — SUCCINYLCHOLINE CHLORIDE 200 MG/10ML IV SOSY
PREFILLED_SYRINGE | INTRAVENOUS | Status: AC
  Filled 2023-08-13: qty 10

## 2023-08-13 MED ORDER — BISACODYL 10 MG RE SUPP: 10.0000 mg | Freq: Every day | RECTAL | Status: DC

## 2023-08-13 MED ORDER — ALBUMIN HUMAN 5 % IV SOLN: INTRAVENOUS | Status: DC | PRN

## 2023-08-13 MED ORDER — FENTANYL CITRATE (PF) 250 MCG/5ML IJ SOLN
INTRAMUSCULAR | Status: DC | PRN
  Administered 2023-08-13: 250 ug via INTRAVENOUS
  Administered 2023-08-13: 200 ug via INTRAVENOUS
  Administered 2023-08-13: 250 ug via INTRAVENOUS
  Administered 2023-08-13: 50 ug via INTRAVENOUS

## 2023-08-13 MED ORDER — PANTOPRAZOLE SODIUM 40 MG IV SOLR
40.0000 mg | Freq: Every day | INTRAVENOUS | Status: AC
  Administered 2023-08-13 – 2023-08-14 (×2): 40 mg via INTRAVENOUS
  Filled 2023-08-13 (×2): qty 10

## 2023-08-13 MED ORDER — CALCIUM CHLORIDE 10 % IV SOLN
INTRAVENOUS | Status: DC | PRN
  Administered 2023-08-13: 300 mg via INTRAVENOUS
  Administered 2023-08-13: 350 mg via INTRAVENOUS
  Administered 2023-08-13: 250 mg via INTRAVENOUS

## 2023-08-13 MED ORDER — ASPIRIN 81 MG PO CHEW: 324.0000 mg | CHEWABLE_TABLET | Freq: Every day | ORAL | Status: DC

## 2023-08-13 MED ORDER — ROCURONIUM BROMIDE 10 MG/ML (PF) SYRINGE
PREFILLED_SYRINGE | INTRAVENOUS | Status: DC | PRN
  Administered 2023-08-13 (×2): 50 mg via INTRAVENOUS
  Administered 2023-08-13: 100 mg via INTRAVENOUS

## 2023-08-13 MED ORDER — SODIUM CHLORIDE (PF) 0.9 % IJ SOLN: OROMUCOSAL | Status: DC | PRN

## 2023-08-13 MED ORDER — MAGNESIUM SULFATE 4 GM/100ML IV SOLN
4.0000 g | Freq: Once | INTRAVENOUS | Status: AC
  Administered 2023-08-13: 4 g via INTRAVENOUS
  Filled 2023-08-13: qty 100

## 2023-08-13 MED ORDER — METOPROLOL TARTRATE 5 MG/5ML IV SOLN: 2.5000 mg | INTRAVENOUS | Status: DC | PRN

## 2023-08-13 MED ORDER — NOREPINEPHRINE 4 MG/250ML-% IV SOLN
0.0000 ug/min | INTRAVENOUS | Status: DC
  Administered 2023-08-14: 2 ug/min via INTRAVENOUS
  Filled 2023-08-13: qty 250

## 2023-08-13 MED ORDER — SODIUM CHLORIDE 0.9 % IV SOLN: 250.0000 mL | INTRAVENOUS | Status: AC

## 2023-08-13 MED ORDER — DOCUSATE SODIUM 100 MG PO CAPS
200.0000 mg | ORAL_CAPSULE | Freq: Every day | ORAL | Status: DC
  Filled 2023-08-13 (×2): qty 2

## 2023-08-13 MED ORDER — PHENYLEPHRINE 80 MCG/ML (10ML) SYRINGE FOR IV PUSH (FOR BLOOD PRESSURE SUPPORT)
PREFILLED_SYRINGE | INTRAVENOUS | Status: AC
  Filled 2023-08-13: qty 10

## 2023-08-13 MED ORDER — HEPARIN SODIUM (PORCINE) 1000 UNIT/ML IJ SOLN
INTRAMUSCULAR | Status: AC
  Filled 2023-08-13: qty 1

## 2023-08-13 MED ORDER — MIDAZOLAM HCL 2 MG/2ML IJ SOLN
2.0000 mg | INTRAMUSCULAR | Status: DC | PRN
  Administered 2023-08-13 – 2023-08-14 (×5): 2 mg via INTRAVENOUS
  Filled 2023-08-13 (×6): qty 2

## 2023-08-13 MED ORDER — PROPOFOL 10 MG/ML IV BOLUS
INTRAVENOUS | Status: AC
Start: 2023-08-13 — End: ?
  Filled 2023-08-13: qty 20

## 2023-08-13 MED ORDER — ASPIRIN 81 MG PO CHEW
324.0000 mg | CHEWABLE_TABLET | Freq: Once | ORAL | Status: AC
  Administered 2023-08-13: 324 mg via ORAL
  Filled 2023-08-13: qty 4

## 2023-08-13 MED ORDER — CALCIUM CHLORIDE 10 % IV SOLN
1.0000 g | Freq: Once | INTRAVENOUS | Status: AC
  Administered 2023-08-13: 1 g via INTRAVENOUS

## 2023-08-13 MED ORDER — ACETAMINOPHEN 160 MG/5ML PO SOLN
650.0000 mg | Freq: Once | ORAL | Status: AC
  Administered 2023-08-13: 650 mg
  Filled 2023-08-13: qty 20.3

## 2023-08-13 MED ORDER — HEMOSTATIC AGENTS (NO CHARGE) OPTIME
TOPICAL | Status: DC | PRN
  Administered 2023-08-13 (×3): 1 via TOPICAL

## 2023-08-13 MED ORDER — CHLORHEXIDINE GLUCONATE 0.12 % MT SOLN
15.0000 mL | OROMUCOSAL | Status: AC
  Administered 2023-08-13: 15 mL via OROMUCOSAL
  Filled 2023-08-13: qty 15

## 2023-08-13 MED ORDER — ORAL CARE MOUTH RINSE
15.0000 mL | OROMUCOSAL | Status: DC
  Administered 2023-08-14 – 2023-08-15 (×15): 15 mL via OROMUCOSAL

## 2023-08-13 MED ORDER — ALBUMIN HUMAN 5 % IV SOLN
250.0000 mL | INTRAVENOUS | Status: DC | PRN
  Administered 2023-08-13 – 2023-08-14 (×4): 12.5 g via INTRAVENOUS
  Filled 2023-08-13: qty 250

## 2023-08-13 MED ORDER — PHENYLEPHRINE 80 MCG/ML (10ML) SYRINGE FOR IV PUSH (FOR BLOOD PRESSURE SUPPORT)
PREFILLED_SYRINGE | INTRAVENOUS | Status: DC | PRN
  Administered 2023-08-13: 160 ug via INTRAVENOUS
  Administered 2023-08-13: 80 ug via INTRAVENOUS

## 2023-08-13 MED ORDER — FENTANYL CITRATE (PF) 250 MCG/5ML IJ SOLN
INTRAMUSCULAR | Status: AC
  Filled 2023-08-13: qty 5

## 2023-08-13 MED ORDER — SODIUM CHLORIDE 0.9% FLUSH: 3.0000 mL | INTRAVENOUS | Status: DC | PRN

## 2023-08-13 MED ORDER — INSULIN REGULAR(HUMAN) IN NACL 100-0.9 UT/100ML-% IV SOLN
INTRAVENOUS | Status: DC
  Administered 2023-08-14: 8.5 [IU]/h via INTRAVENOUS
  Filled 2023-08-13: qty 100

## 2023-08-13 MED ORDER — SODIUM CHLORIDE 0.9 % IV SOLN: INTRAVENOUS | Status: DC | PRN

## 2023-08-13 MED ORDER — PROTAMINE SULFATE 10 MG/ML IV SOLN
INTRAVENOUS | Status: DC | PRN
  Administered 2023-08-13: 350 mg via INTRAVENOUS

## 2023-08-13 MED ORDER — METOCLOPRAMIDE HCL 5 MG/ML IJ SOLN
10.0000 mg | Freq: Four times a day (QID) | INTRAMUSCULAR | Status: AC
  Administered 2023-08-13 – 2023-08-14 (×6): 10 mg via INTRAVENOUS
  Filled 2023-08-13 (×6): qty 2

## 2023-08-13 MED ORDER — ACETAMINOPHEN 500 MG PO TABS
1000.0000 mg | ORAL_TABLET | Freq: Four times a day (QID) | ORAL | Status: DC
  Filled 2023-08-13: qty 2

## 2023-08-13 MED ORDER — PROPOFOL 10 MG/ML IV BOLUS
INTRAVENOUS | Status: DC | PRN
  Administered 2023-08-13 (×2): 25 mg via INTRAVENOUS

## 2023-08-13 MED ORDER — SODIUM CHLORIDE 0.9% FLUSH
3.0000 mL | Freq: Two times a day (BID) | INTRAVENOUS | Status: DC
  Administered 2023-08-13: 10 mL via INTRAVENOUS
  Administered 2023-08-14: 3 mL via INTRAVENOUS
  Administered 2023-08-14 – 2023-08-15 (×2): 10 mL via INTRAVENOUS
  Administered 2023-08-15: 3 mL via INTRAVENOUS

## 2023-08-13 MED ORDER — NOREPINEPHRINE BITARTRATE 1 MG/ML IV SOLN
INTRAVENOUS | Status: DC | PRN
  Administered 2023-08-13: .5 mL via INTRAVENOUS
  Administered 2023-08-13: 1 mL via INTRAVENOUS
  Administered 2023-08-13: .5 mL via INTRAVENOUS
  Administered 2023-08-13 (×3): 1 mL via INTRAVENOUS

## 2023-08-13 MED ORDER — METOPROLOL TARTRATE 12.5 MG HALF TABLET: 12.5000 mg | ORAL_TABLET | Freq: Two times a day (BID) | ORAL | Status: DC

## 2023-08-13 MED ORDER — OXYCODONE HCL 5 MG PO TABS
5.0000 mg | ORAL_TABLET | ORAL | Status: DC | PRN
  Administered 2023-08-15 – 2023-08-16 (×5): 10 mg via ORAL
  Filled 2023-08-13 (×5): qty 2

## 2023-08-13 MED ORDER — SODIUM CHLORIDE 0.9% FLUSH
3.0000 mL | Freq: Two times a day (BID) | INTRAVENOUS | Status: DC
  Administered 2023-08-14 – 2023-08-15 (×4): 3 mL via INTRAVENOUS

## 2023-08-13 MED ORDER — POTASSIUM CHLORIDE 10 MEQ/50ML IV SOLN
10.0000 meq | INTRAVENOUS | Status: AC
  Administered 2023-08-13 (×3): 10 meq via INTRAVENOUS
  Filled 2023-08-13: qty 50

## 2023-08-13 MED ORDER — DEXTROSE 50 % IV SOLN: 0.0000 mL | INTRAVENOUS | Status: DC | PRN

## 2023-08-13 MED ORDER — DEXMEDETOMIDINE HCL IN NACL 400 MCG/100ML IV SOLN: 0.0000 ug/kg/h | INTRAVENOUS | Status: DC

## 2023-08-13 MED ORDER — EPHEDRINE 5 MG/ML INJ
INTRAVENOUS | Status: AC
  Filled 2023-08-13: qty 5

## 2023-08-13 MED ORDER — DEXMEDETOMIDINE HCL IN NACL 400 MCG/100ML IV SOLN
0.0000 ug/kg/h | INTRAVENOUS | Status: DC
  Administered 2023-08-13 – 2023-08-14 (×3): 1 ug/kg/h via INTRAVENOUS
  Administered 2023-08-14: 1.2 ug/kg/h via INTRAVENOUS
  Administered 2023-08-14: 1 ug/kg/h via INTRAVENOUS
  Administered 2023-08-14: 1.2 ug/kg/h via INTRAVENOUS
  Administered 2023-08-15: 0.1 ug/kg/h via INTRAVENOUS
  Filled 2023-08-13 (×8): qty 100

## 2023-08-13 MED ORDER — LACTATED RINGERS IV SOLN: INTRAVENOUS | Status: AC

## 2023-08-13 MED ORDER — MIDAZOLAM HCL (PF) 5 MG/ML IJ SOLN
INTRAMUSCULAR | Status: DC | PRN
  Administered 2023-08-13: 2 mg via INTRAVENOUS
  Administered 2023-08-13: 3 mg via INTRAVENOUS
  Administered 2023-08-13: 2 mg via INTRAVENOUS
  Administered 2023-08-13: 3 mg via INTRAVENOUS

## 2023-08-13 MED ORDER — NICARDIPINE HCL IN NACL 20-0.86 MG/200ML-% IV SOLN: 0.0000 mg/h | INTRAVENOUS | Status: DC

## 2023-08-13 MED ORDER — LACTATED RINGERS IV SOLN: INTRAVENOUS | Status: DC | PRN

## 2023-08-13 MED ORDER — ROCURONIUM BROMIDE 10 MG/ML (PF) SYRINGE
PREFILLED_SYRINGE | INTRAVENOUS | Status: AC
Start: 2023-08-13 — End: ?
  Filled 2023-08-13: qty 10

## 2023-08-13 MED ORDER — ACETAMINOPHEN 160 MG/5ML PO SOLN
1000.0000 mg | Freq: Four times a day (QID) | ORAL | Status: DC
  Administered 2023-08-13 – 2023-08-14 (×3): 1000 mg
  Filled 2023-08-13 (×3): qty 40.6

## 2023-08-13 MED ORDER — MIDAZOLAM HCL (PF) 10 MG/2ML IJ SOLN
INTRAMUSCULAR | Status: AC
  Filled 2023-08-13: qty 2

## 2023-08-13 MED ORDER — HEPARIN SODIUM (PORCINE) 1000 UNIT/ML IJ SOLN
INTRAMUSCULAR | Status: DC | PRN
  Administered 2023-08-13: 40000 [IU] via INTRAVENOUS

## 2023-08-13 MED ORDER — PLASMA-LYTE A IV SOLN: INTRAVENOUS | Status: DC | PRN

## 2023-08-13 MED ORDER — EPINEPHRINE HCL 5 MG/250ML IV SOLN IN NS
0.5000 ug/min | INTRAVENOUS | Status: DC
  Administered 2023-08-14: 2 ug/min via INTRAVENOUS
  Filled 2023-08-13: qty 250

## 2023-08-13 MED ORDER — 0.9 % SODIUM CHLORIDE (POUR BTL) OPTIME
TOPICAL | Status: DC | PRN
  Administered 2023-08-13: 5000 mL

## 2023-08-13 MED ORDER — MORPHINE SULFATE (PF) 2 MG/ML IV SOLN
1.0000 mg | INTRAVENOUS | Status: DC | PRN
  Administered 2023-08-14 (×3): 2 mg via INTRAVENOUS
  Administered 2023-08-15: 4 mg via INTRAVENOUS
  Administered 2023-08-15: 2 mg via INTRAVENOUS
  Filled 2023-08-13 (×3): qty 1
  Filled 2023-08-13: qty 2
  Filled 2023-08-13: qty 1
  Filled 2023-08-13: qty 2

## 2023-08-13 MED ORDER — TRAMADOL HCL 50 MG PO TABS: 50.0000 mg | ORAL_TABLET | ORAL | Status: DC | PRN

## 2023-08-13 MED ORDER — PHENYLEPHRINE HCL-NACL 20-0.9 MG/250ML-% IV SOLN: 0.0000 ug/min | INTRAVENOUS | Status: DC

## 2023-08-13 MED ORDER — VANCOMYCIN HCL IN DEXTROSE 1-5 GM/200ML-% IV SOLN
1000.0000 mg | Freq: Once | INTRAVENOUS | Status: AC
Start: 2023-08-13 — End: 2023-08-14
  Administered 2023-08-13: 1000 mg via INTRAVENOUS
  Filled 2023-08-13: qty 200

## 2023-08-13 MED ORDER — PANTOPRAZOLE SODIUM 40 MG PO TBEC: 40.0000 mg | DELAYED_RELEASE_TABLET | Freq: Every day | ORAL | Status: DC

## 2023-08-13 MED ORDER — BISACODYL 5 MG PO TBEC
10.0000 mg | DELAYED_RELEASE_TABLET | Freq: Every day | ORAL | Status: DC
  Administered 2023-08-14: 10 mg via ORAL
  Filled 2023-08-13 (×2): qty 2

## 2023-08-13 MED ORDER — ONDANSETRON HCL 4 MG/2ML IJ SOLN
4.0000 mg | Freq: Four times a day (QID) | INTRAMUSCULAR | Status: DC | PRN
  Administered 2023-08-17: 4 mg via INTRAVENOUS
  Filled 2023-08-13: qty 2

## 2023-08-13 MED ORDER — ORAL CARE MOUTH RINSE: 15.0000 mL | OROMUCOSAL | Status: DC | PRN

## 2023-08-13 MED ORDER — PROPOFOL 10 MG/ML IV BOLUS
INTRAVENOUS | Status: AC
  Filled 2023-08-13: qty 20

## 2023-08-13 MED ORDER — METOPROLOL TARTRATE 25 MG/10 ML ORAL SUSPENSION
12.5000 mg | Freq: Two times a day (BID) | ORAL | Status: DC
Start: 2023-08-13 — End: 2023-08-14

## 2023-08-13 MED ORDER — CALCIUM CHLORIDE 10 % IV SOLN
INTRAVENOUS | Status: AC
  Filled 2023-08-13: qty 10

## 2023-08-13 MED ORDER — LEVOFLOXACIN IN D5W 750 MG/150ML IV SOLN
750.0000 mg | INTRAVENOUS | Status: AC
  Administered 2023-08-14: 750 mg via INTRAVENOUS
  Filled 2023-08-13: qty 150

## 2023-08-13 MED ORDER — ASPIRIN 325 MG PO TBEC: 325.0000 mg | DELAYED_RELEASE_TABLET | Freq: Every day | ORAL | Status: DC

## 2023-08-13 SURGICAL SUPPLY — 87 items
ADAPTER CARDIO PERF ANTE/RETRO (ADAPTER) ×2 IMPLANT
APPLICATOR COTTON TIP 6 STRL (MISCELLANEOUS) IMPLANT
APPLICATOR COTTON TIP 6IN STRL (MISCELLANEOUS) ×2 IMPLANT
BAG DECANTER FOR FLEXI CONT (MISCELLANEOUS) ×2 IMPLANT
BLADE CLIPPER SURG (BLADE) ×2 IMPLANT
BLADE STERNUM SYSTEM 6 (BLADE) ×2 IMPLANT
BLADE SURG 15 STRL LF DISP TIS (BLADE) ×2 IMPLANT
CANISTER SUCTION 3000ML PPV (SUCTIONS) ×2 IMPLANT
CANISTER WOUNDNEG PRESSURE 500 (CANNISTER) IMPLANT
CANNULA AORTIC ROOT 9FR (CANNULA) ×2 IMPLANT
CANNULA ARTERIAL NVNT 3/8 22FR (MISCELLANEOUS) IMPLANT
CANNULA EZ GLIDE AORTIC 21FR (CANNULA) IMPLANT
CANNULA GUNDRY RCSP 15FR (MISCELLANEOUS) IMPLANT
CANNULA SUMP PERICARDIAL (CANNULA) ×2 IMPLANT
CANNULA VRC MALB SNGL STG 28FR (MISCELLANEOUS) IMPLANT
CANNULA VRC MALB SNGL STG 36FR (MISCELLANEOUS) IMPLANT
CATH ROBINSON RED A/P 18FR (CATHETERS) IMPLANT
CIRCUIT VACPAC SAFETY (MISCELLANEOUS) IMPLANT
CLIP FOGARTY SPRING 6M (CLIP) IMPLANT
CONN 1/2X1/2X1/2 BEN (MISCELLANEOUS) ×2 IMPLANT
CONN ST 3/8 X 1/2 (MISCELLANEOUS) ×4 IMPLANT
CONN Y 3/8X3/8X3/8 BEN (MISCELLANEOUS) IMPLANT
CONTAINER PROTECT SURGISLUSH (MISCELLANEOUS) ×4 IMPLANT
DEVICE ATRICLIP LAA PRCLPII 40 (Clip) IMPLANT
DEVICE SUT CK QUICK LOAD INDV (Prosthesis & Implant Heart) IMPLANT
DEVICE SUT CK QUICK LOAD MINI (Prosthesis & Implant Heart) IMPLANT
DRAIN CHANNEL 28F RND 3/8 FF (WOUND CARE) IMPLANT
DRAPE SRG 135X102X78XABS (DRAPES) ×2 IMPLANT
DRAPE WARM FLUID 44X44 (DRAPES) ×2 IMPLANT
DRESSING PEEL AND PLAC PRVNA20 (GAUZE/BANDAGES/DRESSINGS) IMPLANT
DRSG COVADERM 4X14 (GAUZE/BANDAGES/DRESSINGS) ×2 IMPLANT
ELECT CAUTERY BLADE 6.4 (BLADE) IMPLANT
ELECTRODE REM PT RTRN 9FT ADLT (ELECTROSURGICAL) ×4 IMPLANT
FELT TEFLON 1X6 (MISCELLANEOUS) ×4 IMPLANT
GAUZE 4X4 16PLY ~~LOC~~+RFID DBL (SPONGE) ×2 IMPLANT
GAUZE SPONGE 4X4 12PLY STRL (GAUZE/BANDAGES/DRESSINGS) IMPLANT
GLOVE SS BIOGEL STRL SZ 7.5 (GLOVE) ×2 IMPLANT
GLOVE SURG SIGNA 7.5 PF LTX (GLOVE) IMPLANT
GOWN STRL REUS W/ TWL LRG LVL3 (GOWN DISPOSABLE) ×8 IMPLANT
HEMOSTAT POWDER SURGIFOAM 1G (HEMOSTASIS) ×6 IMPLANT
HEMOSTAT SURGICEL 2X14 (HEMOSTASIS) IMPLANT
INSERT FOGARTY XLG (MISCELLANEOUS) IMPLANT
KIT BASIN OR (CUSTOM PROCEDURE TRAY) ×2 IMPLANT
KIT SUCTION CATH 14FR (SUCTIONS) ×2 IMPLANT
KIT SUT CK MINI COMBO 4X17 (Prosthesis & Implant Heart) IMPLANT
KIT TURNOVER KIT B (KITS) ×2 IMPLANT
LINE VENT (MISCELLANEOUS) IMPLANT
LOOP VASCLR EXTRA MAXI WHITE (MISCELLANEOUS) ×2 IMPLANT
LOOPS VASCLR EXTRA MAXI WHITE (MISCELLANEOUS) ×2 IMPLANT
NS IRRIG 1000ML POUR BTL (IV SOLUTION) ×8 IMPLANT
PACK OPEN HEART (CUSTOM PROCEDURE TRAY) ×2 IMPLANT
PAD ARMBOARD POSITIONER FOAM (MISCELLANEOUS) ×4 IMPLANT
POSITIONER HEAD DONUT 9IN (MISCELLANEOUS) ×2 IMPLANT
SEALANT PATCH FIBRIN 2X4IN (MISCELLANEOUS) IMPLANT
SET MPS 3-ND DEL (MISCELLANEOUS) IMPLANT
SPONGE T-LAP 18X18 ~~LOC~~+RFID (SPONGE) ×8 IMPLANT
SPONGE T-LAP 4X18 ~~LOC~~+RFID (SPONGE) ×2 IMPLANT
SUT ETHIBOND 2 0 SH (SUTURE) IMPLANT
SUT ETHIBOND 2 0 SH 36X2 (SUTURE) ×4 IMPLANT
SUT ETHIBOND 2 0 V4 (SUTURE) IMPLANT
SUT ETHIBOND 2 0V4 GREEN (SUTURE) IMPLANT
SUT PROLENE 3 0 SH1 36 (SUTURE) ×2 IMPLANT
SUT PROLENE 4 0 SH DA (SUTURE) IMPLANT
SUT PROLENE 4-0 RB1 .5 CRCL 36 (SUTURE) ×4 IMPLANT
SUT PROLENE 5 0 C 1 36 (SUTURE) ×4 IMPLANT
SUT PROLENE 5 0 CC1 (SUTURE) ×2 IMPLANT
SUT SILK 1 MH (SUTURE) ×4 IMPLANT
SUT SILK 1 TIES 10X30 (SUTURE) ×2 IMPLANT
SUT SILK 2 0 SH CR/8 (SUTURE) ×4 IMPLANT
SUT SILK 2-0 18XBRD TIE 12 (SUTURE) ×2 IMPLANT
SUT SILK 3 0 SH CR/8 (SUTURE) ×2 IMPLANT
SUT SILK 4-0 18XBRD TIE 12 (SUTURE) ×2 IMPLANT
SUT STEEL 6MS V (SUTURE) IMPLANT
SUT TEM PAC WIRE 2 0 SH (SUTURE) ×8 IMPLANT
SUT VIC AB 1 CTX36XBRD ANBCTR (SUTURE) IMPLANT
SUT VIC AB 2-0 CTX 27 (SUTURE) IMPLANT
SUT VIC AB 3-0 X1 27 (SUTURE) IMPLANT
SUTURE EB EXC GRN/WHT 2-0 D/A (SUTURE) ×2 IMPLANT
SYR BULB IRRIG 60ML STRL (SYRINGE) IMPLANT
SYSTEM SAHARA CHEST DRAIN ATS (WOUND CARE) ×2 IMPLANT
TOWEL GREEN STERILE (TOWEL DISPOSABLE) ×2 IMPLANT
TOWEL GREEN STERILE FF (TOWEL DISPOSABLE) ×2 IMPLANT
TRAY FOLEY SLVR 16FR TEMP STAT (SET/KITS/TRAYS/PACK) ×2 IMPLANT
TUBE SUCT INTRACARD DLP 20F (MISCELLANEOUS) ×2 IMPLANT
UNDERPAD 30X36 HEAVY ABSORB (UNDERPADS AND DIAPERS) ×2 IMPLANT
VALVE ON-X MITRAL 31/33MM (Prosthesis & Implant Heart) IMPLANT
WATER STERILE IRR 1000ML POUR (IV SOLUTION) ×4 IMPLANT

## 2023-08-13 NOTE — Anesthesia Procedure Notes (Signed)
 Central Venous Catheter Insertion Performed by: Peggi Bowels, MD, anesthesiologist Start/End5/14/2025 12:10 PM, 08/13/2023 12:20 PM Patient location: OR. Preanesthetic checklist: patient identified, IV checked, site marked, risks and benefits discussed, surgical consent, monitors and equipment checked, pre-op evaluation, timeout performed and anesthesia consent Position: reverse Trendelenburg Hand hygiene performed  and maximum sterile barriers used  PA cath was placed.Swan type:thermodilution PA Cath depth:45 Procedure performed without using ultrasound guided technique. Attempts: 1 Patient tolerated the procedure well with no immediate complications.

## 2023-08-13 NOTE — Anesthesia Procedure Notes (Signed)
 Arterial Line Insertion Start/End5/14/2025 11:35 AM, 08/13/2023 11:40 AM Performed by: Peggi Bowels, MD, anesthesiologist  Patient location: OR. Preanesthetic checklist: patient identified, IV checked, site marked, risks and benefits discussed, surgical consent, monitors and equipment checked, pre-op evaluation, timeout performed and anesthesia consent Lidocaine  1% used for infiltration and patient sedated Right, radial was placed Catheter size: 20 G Hand hygiene performed , maximum sterile barriers used  and Seldinger technique used  Attempts: 1 Procedure performed using ultrasound guided technique. Ultrasound Notes:anatomy identified, needle tip was noted to be adjacent to the nerve/plexus identified and no ultrasound evidence of intravascular and/or intraneural injection Following insertion, dressing applied and Biopatch. Post procedure assessment: normal and unchanged  Patient tolerated the procedure well with no immediate complications.

## 2023-08-13 NOTE — Anesthesia Procedure Notes (Signed)
 Procedure Name: Intubation Date/Time: 08/13/2023 11:49 AM  Performed by: Viki Graver, CRNAPre-anesthesia Checklist: Patient identified, Emergency Drugs available, Suction available and Patient being monitored Patient Re-evaluated:Patient Re-evaluated prior to induction Oxygen Delivery Method: Circle System Utilized Preoxygenation: Pre-oxygenation with 100% oxygen Induction Type: IV induction Ventilation: Mask ventilation without difficulty Laryngoscope Size: Mac and 3 Grade View: Grade II Tube type: Oral Number of attempts: 1 Airway Equipment and Method: Stylet and Oral airway Placement Confirmation: ETT inserted through vocal cords under direct vision, positive ETCO2 and breath sounds checked- equal and bilateral Secured at: 22 cm Tube secured with: Tape Dental Injury: Teeth and Oropharynx as per pre-operative assessment

## 2023-08-13 NOTE — Anesthesia Preprocedure Evaluation (Addendum)
 Anesthesia Evaluation  Patient identified by MRN, date of birth, ID band Patient awake    Reviewed: Allergy & Precautions, NPO status , Patient's Chart, lab work & pertinent test results  Airway Mallampati: II  TM Distance: >3 FB Neck ROM: Full    Dental  (+) Missing   Pulmonary neg pulmonary ROS   Pulmonary exam normal        Cardiovascular hypertension, Pt. on medications and Pt. on home beta blockers Normal cardiovascular exam  ECHO: 1. Left ventricular ejection fraction, by estimation, is 65 to 70%. The  left ventricle has hyperdynamic function.   2. Right ventricular systolic function is normal. The right ventricular  size is normal.   3. No left atrial/left atrial appendage thrombus was detected. The LAA  emptying velocity was 65 cm/s.   4. Mobile vegetation noted on the anterior mitral leaflet with  perforation of the leaflet. There is thickening and probably vegetation of  the posterior leaflet as well. The mitral valve is abnormal. Severe mitral  valve regurgitation. No evidence of  mitral stenosis.   5. The aortic valve is tricuspid. Aortic valve regurgitation is not  visualized.   6. Agitated saline contrast bubble study was negative, with no evidence  of any interatrial shunt.   7. 3D performed of the mitral valve and demonstrates Endocarditis with  perforation of the anterior leaflet and severe eccentric MR.     Neuro/Psych  PSYCHIATRIC DISORDERS Anxiety Depression    negative neurological ROS     GI/Hepatic negative GI ROS, Neg liver ROS,,,  Endo/Other  diabetes, Oral Hypoglycemic Agents, Insulin  Dependent  Class 3 obesity  Renal/GU Renal disease     Musculoskeletal negative musculoskeletal ROS (+)    Abdominal  (+) + obese  Peds  Hematology  (+) Blood dyscrasia, anemia   Anesthesia Other Findings SEVERE MR  MV ENDOCARDITIS    Reproductive/Obstetrics                               Anesthesia Physical Anesthesia Plan  ASA: 4  Anesthesia Plan: General   Post-op Pain Management:    Induction: Intravenous  PONV Risk Score and Plan: 3 and Ondansetron , Dexamethasone, Midazolam  and Treatment may vary due to age or medical condition  Airway Management Planned: Oral ETT  Additional Equipment: TEE  Intra-op Plan:   Post-operative Plan: Post-operative intubation/ventilation  Informed Consent: I have reviewed the patients History and Physical, chart, labs and discussed the procedure including the risks, benefits and alternatives for the proposed anesthesia with the patient or authorized representative who has indicated his/her understanding and acceptance.     Dental advisory given  Plan Discussed with: CRNA  Anesthesia Plan Comments: (IABP )         Anesthesia Quick Evaluation

## 2023-08-13 NOTE — Progress Notes (Signed)
 Patient ID: Melissa Bray, female   DOB: 10/29/1960, 63 y.o.   MRN: [id number]   TCTS Evening Rounds:   Hemodynamically stable  CI = 2.46 on milrinone 0.25, epi 3, IABP.  Keeping intubated on vent overnight.  Urine output good  CT output low  CBC    Component Value Date/Time   WBC 9.8 08/13/2023 1749   RBC 3.27 (L) 08/13/2023 1749   HGB 9.6 (L) 08/13/2023 1749   HGB 10.2 (L) 08/13/2023 1749   HCT 28.4 (L) 08/13/2023 1749   HCT 30.0 (L) 08/13/2023 1749   PLT 105 (L) 08/13/2023 1749   MCV 86.9 08/13/2023 1749   MCH 29.4 08/13/2023 1749   MCHC 33.8 08/13/2023 1749   RDW 13.4 08/13/2023 1749   LYMPHSABS 1.2 08/13/2023 1749   MONOABS 0.9 08/13/2023 1749   EOSABS 0.1 08/13/2023 1749   BASOSABS 0.0 08/13/2023 1749     BMET    Component Value Date/Time   NA 137 08/13/2023 1749   NA 134 (A) 02/03/2023 0000   K 3.8 08/13/2023 1749   CL 96 (L) 08/13/2023 1551   CO2 30 08/13/2023 0500   GLUCOSE 136 (H) 08/13/2023 1551   BUN 10 08/13/2023 1551   BUN 12 02/03/2023 0000   CREATININE 0.80 08/13/2023 1551   CALCIUM 9.1 08/13/2023 0500   EGFR 82 02/03/2023 0000   GFRNONAA >60 08/13/2023 0500     A/P:  Stable postop course. Continue current plans

## 2023-08-13 NOTE — Transfer of Care (Signed)
 Immediate Anesthesia Transfer of Care Note  Patient: Melissa Bray  Procedure(s) Performed: MITRAL VALVE REPLACEMENT USING ON-X PROSTHETIC MITRAL HEART VALVE SIZE 31/33MM ECHOCARDIOGRAM, TRANSESOPHAGEAL, INTRAOPERATIVE CLIPPING, LEFT ATRIAL APPENDAGE USING ATRICURE LAA EXCLUSION SYSTEM SIZE 40  Patient Location: ICU  Anesthesia Type:General  Level of Consciousness: sedated and Patient remains intubated per anesthesia plan  Airway & Oxygen Therapy: Patient remains intubated per anesthesia plan and Patient placed on Ventilator (see vital sign flow sheet for setting)  Post-op Assessment: Report given to RN and Post -op Vital signs reviewed and stable  Post vital signs: Reviewed and stable  Last Vitals:  Vitals Value Taken Time  BP 143/88 08/13/23 1719  Temp 36.1 C 08/13/23 1719  Pulse 88 08/13/23 1719  Resp 21 08/13/23 1719  SpO2 96 % 08/13/23 1719  Vitals shown include unfiled device data.      Complications: No notable events documented.

## 2023-08-13 NOTE — Interval H&P Note (Signed)
 History and Physical Interval Note:  No issues overnight Will proceed today  08/13/2023 7:53 AM  Melissa Bray  has presented today for surgery, with the diagnosis of SEVERE MR MV ENDOCARDITIS.  The various methods of treatment have been discussed with the patient and family. After consideration of risks, benefits and other options for treatment, the patient has consented to  Procedure(s): REPAIR, MITRAL VALVE (N/A) REPLACEMENT, MITRAL VALVE (N/A) ECHOCARDIOGRAM, TRANSESOPHAGEAL, INTRAOPERATIVE (N/A) as a surgical intervention.  The patient's history has been reviewed, patient examined, no change in status, stable for surgery.  I have reviewed the patient's chart and labs.  Questions were answered to the patient's satisfaction.     [name]

## 2023-08-13 NOTE — Anesthesia Postprocedure Evaluation (Signed)
 Anesthesia Post Note  Patient: Holiday representative  Procedure(s) Performed: MITRAL VALVE REPLACEMENT USING ON-X PROSTHETIC MITRAL HEART VALVE SIZE 31/33MM ECHOCARDIOGRAM, TRANSESOPHAGEAL, INTRAOPERATIVE CLIPPING, LEFT ATRIAL APPENDAGE USING ATRICURE LAA EXCLUSION SYSTEM SIZE 40     Patient location during evaluation: SICU Anesthesia Type: General Level of consciousness: sedated Pain management: pain level controlled Vital Signs Assessment: post-procedure vital signs reviewed and stable Respiratory status: patient remains intubated per anesthesia plan Cardiovascular status: stable Postop Assessment: no apparent nausea or vomiting Anesthetic complications: no   There were no known notable events for this encounter.  Last Vitals:  Vitals:   08/13/23 2100 08/13/23 2115  BP:    Pulse: (!) 195 (!) 195  Resp: 13 13  Temp: (!) 35.7 C (!) 35.7 C  SpO2: 98% 98%    Last Pain:  Vitals:   08/13/23 2000  TempSrc: Core  PainSc:                  Tommie Frame Una Yeomans

## 2023-08-13 NOTE — Brief Op Note (Signed)
 07/31/2023 - 08/13/2023  3:17 PM  PATIENT:  Melissa Bray  63 y.o. female  PRE-OPERATIVE DIAGNOSIS:  SEVERE MITAL REGURGITATION; MITRAL VALVE ENDOCARDITIS  POST-OPERATIVE DIAGNOSIS:  SEVERE MITAL REGURGITATION; MITRAL VALVE ENDOCARDITIS  PROCEDURE:  Procedure(s): MITRAL VALVE REPLACEMENT USING ON-X PROSTHETIC MITRAL HEART VALVE SIZE 31/33MM (N/A) ECHOCARDIOGRAM, TRANSESOPHAGEAL, INTRAOPERATIVE (N/A) CLIPPING, LEFT ATRIAL APPENDAGE USING ATRICURE LAA EXCLUSION SYSTEM SIZE 40  SURGEON:  Surgeons and Role:    * [name], MD - Primary  PHYSICIAN ASSISTANT: WAYNE GOLD PA-C  ASSISTANTS:KRISTINA PRICE RNFA   ANESTHESIA:   general  EBL:  1000 ml   BLOOD ADMINISTERED:2 units CC PRBC  DRAINS: MEDIASTINAL AND PLEURAL CHEST TUBES    LOCAL MEDICATIONS USED:  NONE  SPECIMEN:  Source of Specimen:  MITRAL LEAFLETS  DISPOSITION OF SPECIMEN:  PATH AND MICRO  COUNTS:  YES  TOURNIQUET:  * No tourniquets in log *  DICTATION: .Other Dictation: Dictation Number [id number]  PLAN OF CARE: Admit to inpatient   PATIENT DISPOSITION:  ICU - intubated and hemodynamically stable.   Delay start of Pharmacological VTE agent (>24hrs) due to surgical blood loss or risk of bleeding: yes  COMPLICATIONS: NO KNOWN

## 2023-08-13 NOTE — Progress Notes (Signed)
 NAME:  Melissa Bray, MRN:  [id number], DOB:  January 19, 1961, LOS: 11 ADMISSION DATE:  07/31/2023, CONSULTATION DATE:  5/9 REFERRING MD:  Mitzie Anda, CHIEF COMPLAINT:  respiratory failure   History of Present Illness:  Melissa Bray is a 63 y/o woman with a history of DM, HTN who presented on 5/1 with cellulitis of her LUE and sepsis. At presentation she complained of fevers, generalized weakness, and reduced PO intake. During her evaluation she was diagnosed with GBS bacteremia and mitral valve endocarditis with leaflet perforation. She required dental extractions for dental carries. Plans have been in place for mitral valve repair by TCTS. She was planned for [facility] today; after coming to the cath lab and going through timeout procedure she was moved to the cath lab table. She very quickly got distressed lying back, trying to sit up. She was placed on bipap for respiratory distress. She suddenly went unresponsive during this time without ongoing agitation. No sedation was given. PCCM was consulted in cath lab for emergent management of respiratory failure.    Pertinent  Medical History  MD HTN HLD Breast cancer, s/p left mastectomy  Significant Hospital Events: Including procedures, antibiotic start and stop dates in addition to other pertinent events   5/1 Admitted  5/9 deteriorated due to flash pulmonary edema, intubated, IABP. 5/12 Extubated  5/13 Remains off vent, overnight needing levophed gtt restarted but weaning off  5/14 plan for mitral valve replacement, still on IABP 1:1   Interim History / Subjective:  No issues overnight Patient no distress  Objective    Physical Exam  General: Acute on chronic older adult female lying in ICU bed, in no distress HEENT: Normocephalic, PERRLA intact, pink moist mucous membranes CV: S1, S2,, RRR, no JVD, murmur noted over mitral area, IABP 1: 1 pulm: Clear, diminished, no distress Abs: Both, soft Extremities: Moves all extremities to command,  nonpitting edema Skin: No rash Neuro: Alert and oriented x 4, follows all commands GU: Foley,  Coox 90.2 on milirinone 0.25, IABP 1:1 Cr 0.94, BUN 8   CBC 9.9 hemoglobin, platelets 91, WBC Blood CX: Strep group A, agalactia on 5/1  CVP 5 Blood cultures: Strep group A, agalactia on 5/1 5/10 NGTD   I/O net -1.292 in last 24 hours   Resolved Hospital Problem list :  Acute respiratory failure with hypoxia and hypercapnia Acute encephalopathy due to hypercapnia Hyperkalemia, resolved> wonder if this was due to succinylcholine at intubation Hypomagnesemia  Assessment & Plan:   Acute MR due to MV endocarditis in setting of Strep group A, agalactia bacteremia  Acute HFpEF due to severe MR leading to cardiogenic shock Coox .81, CVP 9-10  Had to restart levophed overnight on 5/12 into 5/13  Blood cultures on 5/10 no growth to date P: ID following appreciate their assistance and recommendations Continue ceftriaxone  Continue milrinone infusion Continue heparin drip infusion Continue IABP Continue to monitor intake and output Continue diuresis per heart failure, appreciate their assistance Plan to undergo mitral valve replacement per TCTs Levophed drip off, continue to monitor BP with other vital signs, MAP goal greater than 65  Hypervolemic hyponatremia Hypokalemia-resolving Na 134 P: Continue to trend sodium daily Continue to optimize and replace electrolytes as needed specially during diuresis  Diabetes type 2, poorly controlled with hyperglycemia CBGs within goal Hemoglobin A1c 12.8 P: Continue resistant sliding scale Continue Semglee  16 units twice daily Currently n.p.o. prior to mitral valve procedure Continue CBGs every 4  Regurgitation status post swallowing evaluation During speech therapy swallow  examination Patient had significant regurgitation of the contents that were given during the bedside evaluation Recommending patient undergo esophagram once  medically stable, n.p.o. except for meds at this time P: Will need esophagram once medically appropriate after mitral valve replacement  Constipation Stool output documented on 5/13 P: Continue current bowel regimen  Anemia of critical illness P: Hemoglobin stable, transfuse for hemoglobin less than 7 Continue to monitor CBCs daily   Best Practice (right click and "Reselect all SmartList Selections" daily)   Diet/type: NPO  DVT prophylaxis systemic heparin Pressure ulcer(s): N/A GI prophylaxis: PPI Lines: Central line, Aline Foley:  Yes, and it is still needed Code Status:  full code Last date of multidisciplinary goals of care discussion-updated patient at beside on 5/13   Melissa Bray   [facility] 08/13/2023, 9:05 AM  Please see [email or web address] for pager details.  From 7A-7P if no response, please call [phone]. After hours, please call ELink [phone].

## 2023-08-13 NOTE — Progress Notes (Signed)
 PHARMACY - ANTICOAGULATION CONSULT NOTE  Pharmacy Consult for heparin Indication: IABP  No Known Allergies  Patient Measurements: Height: 5\' 5"  (165.1 cm) Weight: 115.5 kg (254 lb 10.1 oz) IBW/kg (Calculated) : 57 HEPARIN DW (KG): 84.3  Vital Signs: Temp: 99 F (37.2 C) (05/14 0930) Temp Source: Core (05/14 0715) Pulse Rate: 222 (05/14 0930)  Labs: Recent Labs    08/11/23 0457 08/11/23 0457 08/11/23 0506 08/12/23 0450 08/12/23 0452 08/12/23 0827 08/12/23 1751 08/13/23 0500  HGB  --    < > 9.8*  --   --  9.2*  --  9.9*  HCT  --   --  31.2*  --   --  29.3*  --  31.5*  PLT  --   --  189  --   --  193  --  191  HEPARINUNFRC 0.20*  --   --   --  0.24*  --   --  0.35  CREATININE 1.04*  --   --  0.94  --   --  0.94 0.94   < > = values in this interval not displayed.    Estimated Creatinine Clearance: 76.7 mL/min (by C-G formula based on SCr of 0.94 mg/dL).   Medical History: Past Medical History:  Diagnosis Date   Anxiety    Breast cancer (HCC)    Diabetes mellitus without complication (HCC)    Hyperlipidemia    Hypertension    Type 2 diabetes mellitus without retinopathy (HCC) 06/17/2017    Medications:  Scheduled:   anastrozole   1 mg Oral Daily   Chlorhexidine  Gluconate Cloth  6 each Topical Daily   docusate sodium  100 mg Oral BID   epinephrine   0-10 mcg/min Intravenous To OR   heparin sodium (porcine) 2,500 Units, papaverine 30 mg in electrolyte-A (PLASMALYTE-A PH 7.4) 500 mL irrigation   Irrigation To OR   insulin  aspart  0-20 Units Subcutaneous Q4H   insulin  glargine-yfgn  16 Units Subcutaneous BID   insulin    Intravenous To OR   [facility] Blood Cardioplegia vial (lidocaine /magnesium /mannitol 0.26g-4g-6.4g)   Intracoronary To OR   metoprolol  tartrate  12.5 mg Oral Once   pantoprazole (PROTONIX) IV  40 mg Intravenous Daily   phenylephrine   30-200 mcg/min Intravenous To OR   polyethylene glycol  17 g Oral Daily   potassium chloride   80 mEq Other To OR    pravastatin   20 mg Oral Daily   senna  1 tablet Oral BID   sodium chloride  flush  3 mL Intravenous Q12H   thiamine  100 mg Oral Daily   tranexamic acid  15 mg/kg Intravenous To OR   tranexamic acid  2 mg/kg Intracatheter To OR    Assessment: 89 yof presenting with acute mitral valve endocarditis with perforated mitral leaflet and severe MR. Underwent dental extraction 5/8. Presented for pre-op RHC and developed flash pulmonary edema requiring intubation - IABP was placed in lab. No AC PTA. Start heparin for IABP.  Heparin level is therapeutic at 0.35 on heparin drip 1500 uts/hr, CBC ok, no bleeding.  Goal of Therapy:  Heparin level 0.2-0.5 units/ml Monitor platelets by anticoagulation protocol: Yes   Plan:  Continue heparin to 1500 units/h Daily heparin level and CBC    Levin Reamer, PharmD, [place], [facility] Clinical Pharmacist [phone] Please check AMION for all [facility] Pharmacy numbers 08/13/2023

## 2023-08-13 NOTE — Progress Notes (Signed)
 Interim CCM Progress Note post/op   Physical Exam  General: acute on chronic older adult female, lying in icu bed on vent s/p post op MV replacement  HEENT: Normocephalic, PERRLA intact, ETT, OG Pink MM CV: s1,s2, RRR, no MRG, No JVD  pulm: clear, diminished, no distress on vent  Abs: bs active, soft  Extremities: no edema, no deformity, non pitting edema  Skin: no rash  Neuro: Rass -3 sedation, responds to painful stimuli, cough gag reflex present  GU: foley intact-urine output adequate   Severe Mitral Valve Regurgitation in setting of Mitral Valve Endocarditis s/p  MV replacement  Strep group B, agalactia bacteremia  Hgb 8.5 per Istat performed by Anesthesia post surgery, 460 of cell saver received Initial post-op Cardiac index 1.3 now 1.48- giving albumin  and fluids per TCTS  P:  Post-op management per TCTs (MD Luna Salinas)  -Monitor Chest tube output  Continue milrinone infusion  Heparin on hold prior to procedure  Continue IABP  Levophed gtt restarted, continue to titrate to MAP >65  CXR/ ABG, CBC, BMET now  con't weaning pressors as able to maintain MAP >70-90. Albumin  boluses prn for low SV.  tele monitoring/ pacing prn ID following, continue ceftriaxone    Acute postoperative respiratory insufficiency ABG upon returning to ICU: pH 7.386, PCO2 42.8, PO2 80, Bicarb 26  On 50% Fio2,  P:  Continue ventilator support and lung protective strategies  Continue LTVV 6-8cc/kg  Wean PEEP and Fio2 requirements to sat goal of >92%  HOB > 30 degrees Plat pressures < 30  Intermittent Chest X-ray and ABGS Obtain and follow cultures-blood and tracheal aspirate VAP and PAD protocols in place  Wean sedation as tolerated, SBT and WUA daily - continue precedex for now   Hypervolemic hyponatremia Hypokalemia-resolving  Na 134 P: Continue to trend sodium daily  Recheck electrolytes per BMET along with magnesium     Diabetes type 2, poorly controlled with hyperglycemia P: On  insulin  gtt post-op  Will need to transition back to SSI and long acting once appropriate   Expected post-operative ABLA Expected post-operative consumptive thrombocytopenia  P:  Check CBC Transfuse for hgb <7, or significant bleeding  Monitor chest tube output   [name] AGACNP-BC   [facility] 08/13/2023, 8:49 PM  Please see [email or web address] for pager details.  From 7A-7P if no response, please call [phone]. After hours, please call ELink [phone].

## 2023-08-13 NOTE — Anesthesia Procedure Notes (Signed)
 Central Venous Catheter Insertion Performed by: Peggi Bowels, MD, anesthesiologist Start/End5/14/2025 12:00 PM, 08/13/2023 12:10 PM Patient location: OR. Preanesthetic checklist: patient identified, IV checked, site marked, risks and benefits discussed, surgical consent, monitors and equipment checked, pre-op evaluation, timeout performed and anesthesia consent Position: Trendelenburg Hand hygiene performed , maximum sterile barriers used  and Seldinger technique used Catheter size: 8.5 Fr Total catheter length 10. Sheath introducer Procedure performed using ultrasound guided technique. Ultrasound Notes:anatomy identified, needle tip was noted to be adjacent to the nerve/plexus identified, no ultrasound evidence of intravascular and/or intraneural injection and image(s) printed for medical record Attempts: 1 Following insertion, line sutured, dressing applied and Biopatch. Post procedure assessment: blood return through all ports, free fluid flow and no air  Patient tolerated the procedure well with no immediate complications.

## 2023-08-14 ENCOUNTER — Inpatient Hospital Stay (HOSPITAL_COMMUNITY)

## 2023-08-14 ENCOUNTER — Other Ambulatory Visit: Payer: Self-pay | Admitting: Cardiology

## 2023-08-14 ENCOUNTER — Other Ambulatory Visit (HOSPITAL_BASED_OUTPATIENT_CLINIC_OR_DEPARTMENT_OTHER): Payer: Self-pay

## 2023-08-14 ENCOUNTER — Encounter (HOSPITAL_COMMUNITY): Payer: Self-pay | Admitting: Thoracic Surgery (Cardiothoracic Vascular Surgery)

## 2023-08-14 DIAGNOSIS — Z952 Presence of prosthetic heart valve: Secondary | ICD-10-CM

## 2023-08-14 DIAGNOSIS — I059 Rheumatic mitral valve disease, unspecified: Secondary | ICD-10-CM | POA: Diagnosis not present

## 2023-08-14 DIAGNOSIS — J9602 Acute respiratory failure with hypercapnia: Secondary | ICD-10-CM | POA: Diagnosis not present

## 2023-08-14 DIAGNOSIS — J9601 Acute respiratory failure with hypoxia: Secondary | ICD-10-CM | POA: Diagnosis not present

## 2023-08-14 DIAGNOSIS — I2699 Other pulmonary embolism without acute cor pulmonale: Secondary | ICD-10-CM | POA: Diagnosis not present

## 2023-08-14 LAB — CULTURE, BLOOD (ROUTINE X 2)
Culture: NO GROWTH
Culture: NO GROWTH
Special Requests: ADEQUATE

## 2023-08-14 LAB — POCT I-STAT 7, (LYTES, BLD GAS, ICA,H+H)
Acid-Base Excess: 0 mmol/L (ref 0.0–2.0)
Acid-Base Excess: 0 mmol/L (ref 0.0–2.0)
Acid-Base Excess: 2 mmol/L (ref 0.0–2.0)
Bicarbonate: 24.7 mmol/L (ref 20.0–28.0)
Bicarbonate: 23.8 mmol/L (ref 20.0–28.0)
Bicarbonate: 25 mmol/L (ref 20.0–28.0)
Calcium, Ion: 1.32 mmol/L (ref 1.15–1.40)
Calcium, Ion: 1.25 mmol/L (ref 1.15–1.40)
Calcium, Ion: 1.31 mmol/L (ref 1.15–1.40)
HCT: 27 % — ABNORMAL LOW (ref 36.0–46.0)
HCT: 26 % — ABNORMAL LOW (ref 36.0–46.0)
HCT: 24 % — ABNORMAL LOW (ref 36.0–46.0)
Hemoglobin: 9.2 g/dL — ABNORMAL LOW (ref 12.0–15.0)
Hemoglobin: 8.8 g/dL — ABNORMAL LOW (ref 12.0–15.0)
Hemoglobin: 8.2 g/dL — ABNORMAL LOW (ref 12.0–15.0)
O2 Saturation: 98 %
O2 Saturation: 96 %
O2 Saturation: 93 %
Patient temperature: 37.5
Patient temperature: 37.5
Patient temperature: 38
Potassium: 5.2 mmol/L — ABNORMAL HIGH (ref 3.5–5.1)
Potassium: 4.3 mmol/L (ref 3.5–5.1)
Potassium: 4.4 mmol/L (ref 3.5–5.1)
Sodium: 133 mmol/L — ABNORMAL LOW (ref 135–145)
Sodium: 135 mmol/L (ref 135–145)
Sodium: 134 mmol/L — ABNORMAL LOW (ref 135–145)
TCO2: 26 mmol/L (ref 22–32)
TCO2: 25 mmol/L (ref 22–32)
TCO2: 26 mmol/L (ref 22–32)
pCO2 arterial: 40.6 mmHg (ref 32–48)
pCO2 arterial: 34.4 mmHg (ref 32–48)
pCO2 arterial: 35.1 mmHg (ref 32–48)
pH, Arterial: 7.393 (ref 7.35–7.45)
pH, Arterial: 7.451 — ABNORMAL HIGH (ref 7.35–7.45)
pH, Arterial: 7.465 — ABNORMAL HIGH (ref 7.35–7.45)
pO2, Arterial: 103 mmHg (ref 83–108)
pO2, Arterial: 77 mmHg — ABNORMAL LOW (ref 83–108)
pO2, Arterial: 65 mmHg — ABNORMAL LOW (ref 83–108)

## 2023-08-14 LAB — BASIC METABOLIC PANEL WITH GFR
Anion gap: 9 (ref 5–15)
Anion gap: 10 (ref 5–15)
BUN: 10 mg/dL (ref 8–23)
BUN: 9 mg/dL (ref 8–23)
CO2: 23 mmol/L (ref 22–32)
CO2: 25 mmol/L (ref 22–32)
Calcium: 9 mg/dL (ref 8.9–10.3)
Calcium: 9.1 mg/dL (ref 8.9–10.3)
Chloride: 102 mmol/L (ref 98–111)
Chloride: 101 mmol/L (ref 98–111)
Creatinine, Ser: 0.95 mg/dL (ref 0.44–1.00)
Creatinine, Ser: 0.95 mg/dL (ref 0.44–1.00)
GFR, Estimated: >60 mL/min (ref >60)
GFR, Estimated: >60 mL/min (ref >60)
Glucose, Bld: 254 mg/dL — ABNORMAL HIGH (ref 70–99)
Glucose, Bld: 202 mg/dL — ABNORMAL HIGH (ref 70–99)
Potassium: 5 mmol/L (ref 3.5–5.1)
Potassium: 4.1 mmol/L (ref 3.5–5.1)
Sodium: 134 mmol/L — ABNORMAL LOW (ref 135–145)
Sodium: 136 mmol/L (ref 135–145)

## 2023-08-14 LAB — CBC
HCT: 29 % — ABNORMAL LOW (ref 36.0–46.0)
HCT: 28.5 % — ABNORMAL LOW (ref 36.0–46.0)
Hemoglobin: 9.4 g/dL — ABNORMAL LOW (ref 12.0–15.0)
Hemoglobin: 9.3 g/dL — ABNORMAL LOW (ref 12.0–15.0)
MCH: 28.4 pg (ref 26.0–34.0)
MCH: 29.1 pg (ref 26.0–34.0)
MCHC: 32.4 g/dL (ref 30.0–36.0)
MCHC: 32.6 g/dL (ref 30.0–36.0)
MCV: 87.6 fL (ref 80.0–100.0)
MCV: 89.1 fL (ref 80.0–100.0)
Platelets: 139 10*3/uL — ABNORMAL LOW (ref 150–400)
Platelets: 121 10*3/uL — ABNORMAL LOW (ref 150–400)
RBC: 3.31 MIL/uL — ABNORMAL LOW (ref 3.87–5.11)
RBC: 3.2 MIL/uL — ABNORMAL LOW (ref 3.87–5.11)
RDW: 13.7 % (ref 11.5–15.5)
RDW: 14 % (ref 11.5–15.5)
WBC: 10.3 10*3/uL (ref 4.0–10.5)
WBC: 10.3 10*3/uL (ref 4.0–10.5)
nRBC: 0 % (ref 0.0–0.2)
nRBC: 0 % (ref 0.0–0.2)

## 2023-08-14 LAB — GLUCOSE, CAPILLARY
Glucose-Capillary: 110 mg/dL — ABNORMAL HIGH (ref 70–99)
Glucose-Capillary: 138 mg/dL — ABNORMAL HIGH (ref 70–99)
Glucose-Capillary: 156 mg/dL — ABNORMAL HIGH (ref 70–99)
Glucose-Capillary: 161 mg/dL — ABNORMAL HIGH (ref 70–99)
Glucose-Capillary: 168 mg/dL — ABNORMAL HIGH (ref 70–99)
Glucose-Capillary: 168 mg/dL — ABNORMAL HIGH (ref 70–99)
Glucose-Capillary: 170 mg/dL — ABNORMAL HIGH (ref 70–99)
Glucose-Capillary: 189 mg/dL — ABNORMAL HIGH (ref 70–99)
Glucose-Capillary: 197 mg/dL — ABNORMAL HIGH (ref 70–99)
Glucose-Capillary: 204 mg/dL — ABNORMAL HIGH (ref 70–99)
Glucose-Capillary: 210 mg/dL — ABNORMAL HIGH (ref 70–99)
Glucose-Capillary: 211 mg/dL — ABNORMAL HIGH (ref 70–99)
Glucose-Capillary: 212 mg/dL — ABNORMAL HIGH (ref 70–99)
Glucose-Capillary: 232 mg/dL — ABNORMAL HIGH (ref 70–99)
Glucose-Capillary: 233 mg/dL — ABNORMAL HIGH (ref 70–99)
Glucose-Capillary: 235 mg/dL — ABNORMAL HIGH (ref 70–99)
Glucose-Capillary: 240 mg/dL — ABNORMAL HIGH (ref 70–99)
Glucose-Capillary: 252 mg/dL — ABNORMAL HIGH (ref 70–99)
Glucose-Capillary: 259 mg/dL — ABNORMAL HIGH (ref 70–99)

## 2023-08-14 LAB — COOXEMETRY PANEL
Carboxyhemoglobin: 1.6 % — ABNORMAL HIGH (ref 0.5–1.5)
Methemoglobin: <0.7 % (ref 0.0–1.5)
O2 Saturation: 59.9 %
Total hemoglobin: 9.8 g/dL — ABNORMAL LOW (ref 12.0–16.0)

## 2023-08-14 LAB — MAGNESIUM
Magnesium: 2.5 mg/dL — ABNORMAL HIGH (ref 1.7–2.4)
Magnesium: 1.9 mg/dL (ref 1.7–2.4)

## 2023-08-14 LAB — POCT ACTIVATED CLOTTING TIME
Activated Clotting Time: 175 s
Activated Clotting Time: 441 s
Activated Clotting Time: 89 s
Activated Clotting Time: 84 s

## 2023-08-14 LAB — SURGICAL PATHOLOGY

## 2023-08-14 LAB — APTT: aPTT: 25 s (ref 24–36)

## 2023-08-14 MED ORDER — ACETAMINOPHEN 160 MG/5ML PO SOLN
1000.0000 mg | Freq: Four times a day (QID) | ORAL | Status: AC
  Administered 2023-08-15 – 2023-08-18 (×9): 1000 mg
  Filled 2023-08-14 (×9): qty 40.6

## 2023-08-14 MED ORDER — DEXTROSE 5 % IV SOLN
12.0000 10*6.[IU] | Freq: Two times a day (BID) | INTRAVENOUS | Status: DC
  Administered 2023-08-14 – 2023-08-20 (×12): 12 10*6.[IU] via INTRAVENOUS
  Filled 2023-08-14 (×15): qty 12

## 2023-08-14 MED ORDER — PENICILLIN G POTASSIUM 20000000 UNITS IJ SOLR
4.0000 10*6.[IU] | Freq: Once | INTRAVENOUS | Status: AC
  Administered 2023-08-14: 4 10*6.[IU] via INTRAVENOUS
  Filled 2023-08-14: qty 4

## 2023-08-14 MED ORDER — FUROSEMIDE 10 MG/ML IJ SOLN
40.0000 mg | Freq: Once | INTRAMUSCULAR | Status: AC
  Administered 2023-08-14: 40 mg via INTRAVENOUS
  Filled 2023-08-14: qty 4

## 2023-08-14 MED ORDER — CHLORHEXIDINE GLUCONATE CLOTH 2 % EX PADS
6.0000 | MEDICATED_PAD | Freq: Every day | CUTANEOUS | Status: DC
  Administered 2023-08-14 – 2023-08-29 (×15): 6 via TOPICAL

## 2023-08-14 MED ORDER — ACETAMINOPHEN 500 MG PO TABS
1000.0000 mg | ORAL_TABLET | Freq: Four times a day (QID) | ORAL | Status: AC
  Administered 2023-08-16 – 2023-08-17 (×3): 1000 mg via ORAL
  Filled 2023-08-14 (×2): qty 2

## 2023-08-14 MED ORDER — ASPIRIN 81 MG PO TBEC
81.0000 mg | DELAYED_RELEASE_TABLET | Freq: Every day | ORAL | Status: DC
  Administered 2023-08-14: 81 mg via ORAL
  Filled 2023-08-14 (×2): qty 1

## 2023-08-14 MED ORDER — ACETAMINOPHEN 10 MG/ML IV SOLN
1000.0000 mg | Freq: Four times a day (QID) | INTRAVENOUS | Status: AC
  Administered 2023-08-14 – 2023-08-15 (×5): 1000 mg via INTRAVENOUS
  Filled 2023-08-14 (×5): qty 100

## 2023-08-14 NOTE — Progress Notes (Signed)
 NAME:  Melissa Bray, MRN:  [id number], DOB:  Jan 25, 1961, LOS: 12 ADMISSION DATE:  07/31/2023, CONSULTATION DATE:  5/9 REFERRING MD:  Mitzie Anda, CHIEF COMPLAINT:  respiratory failure   History of Present Illness:  Melissa Bray is a 63 y/o woman with a history of DM, HTN who presented on 5/1 with cellulitis of her LUE and sepsis. At presentation she complained of fevers, generalized weakness, and reduced PO intake. During her evaluation she was diagnosed with GBS bacteremia and mitral valve endocarditis with leaflet perforation. She required dental extractions for dental carries. Plans have been in place for mitral valve repair by TCTS. She was planned for [facility] today; after coming to the cath lab and going through timeout procedure she was moved to the cath lab table. She very quickly got distressed lying back, trying to sit up. She was placed on bipap for respiratory distress. She suddenly went unresponsive during this time without ongoing agitation. No sedation was given. PCCM was consulted in cath lab for emergent management of respiratory failure.    Pertinent  Medical History  MD HTN HLD Breast cancer, s/p left mastectomy  Significant Hospital Events: Including procedures, antibiotic start and stop dates in addition to other pertinent events   5/1 Admitted  5/9 deteriorated due to flash pulmonary edema, intubated, IABP. 5/12 Extubated  5/13 Remains off vent, overnight needing levophed gtt restarted but weaning off  5/14 plan for mitral valve replacement, still on IABP 1:1  5/15 mitral valve replacement, remains on vent post op   Interim History / Subjective:  No acute change overnight  IABP 1:3  Remains on low dose epi gtt, levo off   Objective    Physical Exam  General: Acute on chronic older adult female lying in ICU bed, in no distress HEENT: Normocephalic, PERRLA intact, pink moist mucous membranes CV: S1, S2,, RRR, no JVD, murmur noted over mitral area, IABP 1: 1 pulm: Clear,  diminished, no distress Abs: Both, soft Extremities: Moves all extremities to command, nonpitting edema Skin: No rash Neuro: Alert and oriented x 4, follows all commands GU: Foley    Resolved Hospital Problem list :  Acute encephalopathy due to hypercapnia Hyperkalemia, resolved> wonder if this was due to succinylcholine at intubation Hypomagnesemia  Assessment & Plan:   Acute MR due to MV endocarditis in setting of Strep group A, agalactia bacteremia  Acute HFpEF due to severe MR leading to cardiogenic shock S/p mitral valve replacement 5/14 P: ID following appreciate their assistance and recommendations Continue ceftriaxone  heparin drip - transition to coumadin per pharmacy 5/16 per CVTS Continue IABP - weaning  Continue amiodarone  ASA Continue to monitor intake and output Monitor volume status - CVP 9-11 this am  Wean epi    Acute respiratory failure - post op vent needs post mitral valve replacement  P:  Vent support - 8cc/kg  F/u CXR  F/u ABG Precedex, PRN morphine, versed   Daily WUA - goal SBT once IABP off   Hypervolemic hyponatremia  - improved  P: F/u chem   Diabetes type 2, poorly controlled with hyperglycemia CBGs within goal Hemoglobin A1c 12.8 P: Insulin  gtt   Regurgitation status post swallowing evaluation During speech therapy swallow examination Patient had significant regurgitation of the contents that were given during the bedside evaluation Recommending patient undergo esophagram once medically stable, n.p.o. except for meds at this time P: Will need esophagram once medically appropriate after mitral valve replacement  Constipation Stool output documented on 5/13 P: Continue current bowel  regimen  Anemia of critical illness P: Hemoglobin stable, transfuse for hemoglobin less than 7 Continue to monitor CBCs daily   Best Practice (right click and "Reselect all SmartList Selections" daily)   Diet/type: NPO  DVT prophylaxis  systemic heparin Pressure ulcer(s): N/A GI prophylaxis: PPI Lines: Central line, Aline Foley:  Yes, and it is still needed Code Status:  full code Last date of multidisciplinary goals of care discussion- 5/13  Lyndel Sanes, NP Pulmonary/Critical Care Medicine  08/14/2023  9:08 AM   See [place] Foley for personal pager PCCM on call pager [phone] until 7pm. Please call Elink 7p-7a. [phone]

## 2023-08-14 NOTE — Progress Notes (Signed)
 PHARMACY - ANTICOAGULATION CONSULT NOTE  Pharmacy Consult for warfarin Indication: onX mMVR  No Known Allergies  Patient Measurements: Height: 5\' 5"  (165.1 cm) Weight: 122.7 kg (270 lb 8.1 oz) IBW/kg (Calculated) : 57 HEPARIN DW (KG): 84.3  Vital Signs: Temp: 99.3 F (37.4 C) (05/15 0700) Temp Source: Oral (05/15 0500) Pulse Rate: 105 (05/15 0700)  Labs: Recent Labs    08/12/23 0452 08/12/23 0827 08/13/23 0500 08/13/23 1231 08/13/23 1446 08/13/23 1454 08/13/23 1547 08/13/23 1551 08/13/23 1749 08/13/23 1802 08/14/23 0400 08/14/23 0404 08/14/23 0635  HGB  --    < > 9.9*   < > 8.5* 8.8*   < > 9.2* 10.2*  9.6*  --  9.4* 9.2*  --   HCT  --    < > 31.5*   < > 24.9* 26.0*   < > 27.0* 30.0*  28.4*  --  29.0* 27.0*  --   PLT  --    < > 191  --  135*  --   --   --  105*  --  139*  --   --   APTT  --   --   --   --   --   --   --   --   --  51*  --   --  25  LABPROT  --   --   --   --   --   --   --   --   --  16.2*  --   --   --   INR  --   --   --   --   --   --   --   --   --  1.3*  --   --   --   HEPARINUNFRC 0.24*  --  0.35  --   --   --   --   --   --   --   --   --   --   CREATININE  --    < > 0.94   < >  --  0.80  --  0.80  --   --  0.95  --   --    < > = values in this interval not displayed.    Estimated Creatinine Clearance: 78.7 mL/min (by C-G formula based on SCr of 0.95 mg/dL).   Medical History: Past Medical History:  Diagnosis Date   Anxiety    Breast cancer (HCC)    Diabetes mellitus without complication (HCC)    Hyperlipidemia    Hypertension    Type 2 diabetes mellitus without retinopathy (HCC) 06/17/2017    Medications:  Scheduled:   acetaminophen   1,000 mg Oral Q6H   Or   acetaminophen  (TYLENOL ) oral liquid 160 mg/5 mL  1,000 mg Per Tube Q6H   anastrozole   1 mg Oral Daily   aspirin  EC  81 mg Oral Daily   bisacodyl  10 mg Oral Daily   Or   bisacodyl  10 mg Rectal Daily   Chlorhexidine  Gluconate Cloth  6 each Topical Daily   docusate  sodium  200 mg Oral Daily   metoCLOPramide (REGLAN) injection  10 mg Intravenous Q6H   mouth rinse  15 mL Mouth Rinse Q2H   [START ON 08/15/2023] pantoprazole  40 mg Oral Daily   pantoprazole (PROTONIX) IV  40 mg Intravenous QHS   sodium chloride  flush  3 mL Intravenous Q12H   sodium chloride  flush  3-10 mL Intravenous  Q12H    Assessment: 71 yof presenting with acute mitral valve endocarditis with perforated mitral leaflet and severe MR. Underwent dental extraction 5/8. Presented for pre-op RHC and developed flash pulmonary edema requiring intubation - IABP was placed in lab and IV heparin started. No AC PTA.  Pt now s/p cardiac surgery with onX mMVR placed. Pharmacy consulted to start warfarin 5/16 after IABP removal.   Goal of Therapy:  INR 2.5-3.5 Monitor platelets by anticoagulation protocol: Yes   Plan:  Warfarin to start tomorrow Daily INR   Levin Reamer, PharmD, BCPS, [facility] Clinical Pharmacist [phone] Please check AMION for all [facility] Pharmacy numbers 08/14/2023

## 2023-08-14 NOTE — Op Note (Signed)
 [name], Bray MEDICAL RECORD NO: [id number] ACCOUNT NO: [id number] DATE OF BIRTH: 26-Jul-1960 FACILITY: [facility] LOCATION: [facility] PHYSICIAN: Milon Aloe. Luna Salinas, MD  Operative Report   DATE OF PROCEDURE: 08/13/2023  PREOPERATIVE DIAGNOSIS: Severe mitral regurgitation secondary to endocarditis.  POSTOPERATIVE DIAGNOSIS: Severe mitral regurgitation secondary to endocarditis.  PROCEDURE:  Median sternotomy, extracorporeal circulation,  Mitral valve replacement with Onyx 31/33 mechanical prosthesis (reference ONXM, serial number [id number]),  Left atrial appendage clip using 40 mm AtriCure AtriClip Pro 240 (lot [id number]).  SURGEON: Milon Aloe. Luna Salinas, MD.  ASSISTANT: Matt Song, PA.  ANESTHESIA: General.  FINDINGS: Transesophageal echocardiography showed severe mitral regurgitation with central perforation of the anterior mitral leaflet, possible small vegetation on P2. Intraoperatively, very small vegetation on P2. Majority of anterior leaflet disrupted with vegetation. Small rim of leaflet able to be preserved for chordal preservation. Post bypass transesophageal echocardiography showed good function of the prosthesis with no paravalvular leaks. Preserved left ventricular wall motion.  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 She went into congestive heart failure following dental extraction and cardiac catheterization but diuresed well and was able to be extubated. She now is felt to be in a reasonable window to proceed with mitral valve repair or replacement. The patient was advised that there is a strong likelihood of a need for replacement and offered the options of tissue or mechanical valves. Given her relatively young age, mechanical valve was the preferred option. She understood the need for lifelong anticoagulation. The indications, risks, benefits, and  alternatives were discussed in  detail with the patient. She understood and accepted the risks and agreed to proceed.  OPERATIVE NOTE: Melissa Bray was brought to the operating room directly from the ICU. She had an intraaortic balloon pump in place. In the operating room, anesthesia placed a new arterial blood pressure monitoring line. She then was anesthetized and intubated. A Foley catheter was already in place. A new central line was established and a Swan-Ganz was advanced. The Swan-Ganz in the groin was removed. Transesophageal echocardiography was performed by Dr. Claudio Culver. Please see his separately dictated note for full details. Findings as noted above. The chest, abdomen, and legs were prepped and draped in the usual sterile fashion.   Timeout was performed. A median sternotomy was performed. 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 The patient was morbidly obese. Initial hemostasis was obtained and a retractor was placed and gradually opened over time. The patient was fully heparinized. The pericardium was opened. The ascending aorta was inspected. It was of normal caliber with no evidence of atherosclerotic disease. After confirming adequate anticoagulation with ACT measurement, the aorta was cannulated via concentric 2-0 Ethibond pledgeted pursestring sutures. A 28-French venous cannula then was placed into the superior vena cava via pursestring suture just above the right atrial junction. Initial cardiopulmonary bypass was initiated. Approximately 2.5 L of flow was possible. A second venous cannula was placed in the inferior aspect of the right atrium. This was a 36-French malleable cannula that was advanced into the inferior vena cava. Caval tapes were placed but only tightened during the open portion of the procedure. A retrograde cardioplegia cannula was placed via pursestring suture in the right atrium and directed into the coronary sinus. A foam pad was placed in the pericardium to insulate  the heart. A temperature probe was placed in the myocardial septum and a cardioplegia cannula was placed in the ascending  aorta.   The aorta was cross-clamped. The left ventricle was emptied via the aortic root vent. Cardiac arrest then was achieved with a combination of cold, antegrade and retrograde KBC blood cardioplegia, and topical ice saline. The calculated dose was 1.5 L. Initial 500 mL was given antegrade and there was a rapid diastolic arrest. The remainder of the cardioplegia was administered retrograde. An additional dose was given at 60 minutes of cross-clamp time. The intraatrial groove was dissected out and a left atriotomy was performed. The mitral retractor was placed. The valve was inspected. The anterior leaflet was essentially destroyed. The majority of the leaflet was either perforated or involved with the vegetation. The vegetation was removed with a small rim of residual normal-appearing leaflet tissue preserved with chordal attachments. These were sewn to the annulus with 2-0 Ethibond pledgeted sutures. On the posterior leaflet, there was a small vegetation that was removed and sent along with the anterior leaflet vegetation. The majority was sent for pathology, but a piece was sent for cultures as well. The annulus sized for a 31/33 mm Onyx mechanical prosthesis. 2-0 Ethibond horizontal mattress sutures were placed circumferentially around the annulus.  The entire posterior leaflet was preserved as well as the small areas of chordal attachment on the anterior leaflet. The sutures were placed through the sewing ring of the valve. The valve was lowered into place and the sutures were secured with a Cor-Knot device. The valve seated nicely in the annulus. Both leaflets moved freely and probing the annulus with a fine-tip right angle revealed no gaps. Rewarming was begun. The left atriotomy then was closed in two layers with a running 4-0 Prolene horizontal mattress suture. De-airing maneuvers  were performed before tying that suture and then the second layer was a running 4-0 Prolene simple suture. The reanimation dose of cardioplegia was administered via the retrograde cannula. That was 400 mL.  The patient then was placed in Trendelenburg position. De-airing was performed and the aortic cross-clamp was removed. The total cross-clamp time was 82 minutes. The patient spontaneously resumed a bradycardic rhythm but then was in a junctional rhythm. The left atrial appendage was measured for a 40 mm AtriCure clip. The clip was placed across the appendage and deployed with good positioning. Epicardial pacing wires were placed on the right ventricle and right atrium. The patient was already on a milrinone  infusion. Low-dose epinephrine  infusion was initiated and the intraaortic balloon pump was placed back on at 1:1. The patient then weaned from bypass on the first attempt. Total bypass time was 129 minutes. Prior to weaning from bypass, the superior vena caval cannula was redirected into the right atrium and the inferior vena cava cannula was removed. After weaning from bypass, transesophageal echocardiography was again performed by anesthesia. There was good function of the prosthetic valve with no paravalvular leaks. There was preserved left ventricular systolic function. A test dose of Protamine  was administered and was well tolerated. The superior vena cava cannula and aortic cannula were removed. The retrograde cardioplegia cannula was removed before weaning from bypass. The remainder of the protamine  was administered without incidence. The chest was irrigated with warm saline. Hemostasis was achieved. The right pleural space had been entered on sternal entry, so a 28-French Blake drain was placed into the right pleural space. A 36-French chest tube was placed into the anterior mediastinum. Of note, the patient did receive 2 units of packed red blood cells when going on bypass due to anemia. The  pericardium was not  closed. The sternum was closed with a combination of single and double heavy-gauge stainless steel wires. The pectoralis fascia, subcutaneous tissue, and skin were closed in standard fashion. All sponge, needle, and instrument counts were correct at the end of the procedure. There was a retractor that was opened during the procedure without time to count the courts individually. Therefore, a chest x-ray was done in the operating room. There were no retractor parts or other foreign bodies noted on the x-ray. The patient then was transported from the operating room to the surgical intensive care unit intubated and in critical but stable condition.  Experience assistance was necessary for this case due to surgical complexity.  Matt Song assisted with exposure, retraction of delicate tissues, suctioning, and suture management.  SUJ D: 08/13/2023 5:46:33 pm T: 08/14/2023 4:07:00 am  JOB: [id number]/ [id number]

## 2023-08-14 NOTE — Progress Notes (Signed)
 EVENING ROUNDS NOTE :     [address]       [name] [address]             [phone]                 1 Day Post-Op Procedure(s) (LRB): MITRAL VALVE REPLACEMENT USING ON-X PROSTHETIC MITRAL HEART VALVE SIZE 31/33MM (N/A) ECHOCARDIOGRAM, TRANSESOPHAGEAL, INTRAOPERATIVE (N/A) CLIPPING, LEFT ATRIAL APPENDAGE USING ATRICURE LAA EXCLUSION SYSTEM SIZE 40   Total Length of Stay:  LOS: 12 days  Events:   No events Iabp out extubated    BP 110/68   Pulse 85   Temp 99.9 F (37.7 C) (Core)   Resp (!) 23   Ht 5\' 5"  (1.651 m)   Wt 122.7 kg   SpO2 94%   BMI 45.01 kg/m   PAP: (20-43)/(12-37) 22/13 CVP:  [4 mmHg-39 mmHg] 4 mmHg CO:  [3.5 L/min-6.2 L/min] 5.3 L/min CI:  [1.6 L/min/m2-2.8 L/min/m2] 2.41 L/min/m2  Vent Mode: PSV;CPAP FiO2 (%):  [40 %-50 %] 40 % Set Rate:  [4 bmp-16 bmp] 4 bmp Vt Set:  [460 mL] 460 mL PEEP:  [5 cmH20] 5 cmH20 Pressure Support:  [10 cmH20] 10 cmH20 Plateau Pressure:  [21 cmH20-25 cmH20] 25 cmH20   sodium chloride  Stopped (08/14/23 1715)   acetaminophen  Stopped (08/14/23 1915)   albumin  human Stopped (08/14/23 9629)   amiodarone 30 mg/hr (08/14/23 2100)   dexmedetomidine (PRECEDEX) IV infusion Stopped (08/14/23 1630)   epinephrine  4 mcg/min (08/14/23 2100)   insulin  2.6 Units/hr (08/14/23 2100)   milrinone 0.25 mcg/kg/min (08/14/23 2100)   niCARDipine     norepinephrine (LEVOPHED) Adult infusion Stopped (08/14/23 0801)   penicillin  G potassium 12 Million Units in dextrose  5 % 500 mL CONTINUOUS infusion 41.7 mL/hr at 08/14/23 2100   phenylephrine  (NEO-SYNEPHRINE) Adult infusion      I/O last 3 completed shifts: In: 8515.8 [I.V.:4656.8; Blood:460; NG/GT:120; IV Piggyback:3279] Out: 5770 [Urine:3610; Emesis/NG output:500; Blood:1000; Chest Tube:660]      Latest Ref Rng & Units 08/14/2023    5:00 PM 08/14/2023    3:57 PM 08/14/2023    3:36 PM  CBC  WBC 4.0 - 10.5 K/uL  10.3    Hemoglobin 12.0 - 15.0 g/dL 8.2  9.3  8.8    Hematocrit 36.0 - 46.0 % 24.0  28.5  26.0   Platelets 150 - 400 K/uL  121         Latest Ref Rng & Units 08/14/2023    5:00 PM 08/14/2023    3:57 PM 08/14/2023    3:36 PM  BMP  Glucose 70 - 99 mg/dL  528    BUN 8 - 23 mg/dL  9    Creatinine 4.13 - 1.00 mg/dL  2.44    Sodium 010 - 272 mmol/L 134  136  135   Potassium 3.5 - 5.1 mmol/L 4.4  4.1  4.3   Chloride 98 - 111 mmol/L  101    CO2 22 - 32 mmol/L  25    Calcium 8.9 - 10.3 mg/dL  9.1      ABG    Component Value Date/Time   PHART 7.465 (H) 08/14/2023 1700   PCO2ART 35.1 08/14/2023 1700   PO2ART 65 (L) 08/14/2023 1700   HCO3 25.0 08/14/2023 1700   TCO2 26 08/14/2023 1700   ACIDBASEDEF 1.0 08/08/2023 1634   O2SAT 93 08/14/2023 1700       Starleen Eastern, MD 08/14/2023 9:27 PM

## 2023-08-14 NOTE — Hospital Course (Addendum)
 History of Present Illness:     Melissa Bray is a 63 year old female with a past medical history of hypertension, class III obesity, hyperlipidemia, uncontrolled type 2 diabetes mellitus, generalized anxiety disorder, and breast cancer (s/p left mastectomy 2016). She presented to [facility] ED with complaints of weakness, worsening fatigue, chills, fever and lack of appetite for 5 days. She denied chest pain, shortness of breath, nausea and vomiting. She also noticed increased swelling, erythema, warmth and tenderness of her left upper extremity above her elbow where she chronically has lymphedema. Upon arrival to the ED she was febrile, tachycardic and BP was initially 95/65 prior to IV fluid administration. Blood cultures x 2 were drawn and she was started on IV Vancomycin  and IV Flagyl  for sepsis. She was then transferred to [facility] on 05/01 for admission for sepsis due to suspected LUE cellulitis at that time. LUE ultrasound was negative for acute DVT. Blood cultures were positive for streptococcus agalactiae. Vancomycin  and Flagyl  were transitioned to IV Cefazolin . The patient was found to have a murmur on exam. She was transferred to [facility] and underwent TTE on 05/04 which showed LVEF 65-70%, moderate to probable severe mitral regurgitation with a jet originating from the proximal portion of the anterior mitral valve leaflet suggesting a likely perforated anterior mitral valve leaflet. TEE on 08/04/23 showed LVEF 65-70%, a mobile vegetation on the anterior mitral leaflet with perforation as well as thickening and probable vegetation of the posterior leaflet, and severe eccentric mitral valve regurgitation.    The patient states her arm swelling and redness has resolved since she has been in the hospital. She denies current chest pain and is hoping to get surgery. She works as a Lawyer.   Dr. Luna Salinas reviewed the patient's diagnostic studies and determined she would  benefit from surgical intervention. He reviewed the treatment options as well as the risks and benefits of surgery with the patient. Melissa Bray was agreeable to proceed with surgery.  Hospital Course:  The patient remained admitted to [facility], and developed respiratory failure and became nonresponsive requiring intubation on 05/09. Cardiac catheterization on 05/09 showed normal LV end diastolic pressure and mild pulmonary hypertension, IABP was placed without complication. She was extubated on 05/12 without complication. She remained stable and was as medically optimized as possible and was taken to the operating room on 05/14. She underwent mitral valve replacement utilizing a 31/30mm Onyx mechanical valve and clipping of th left atrial appendage utilizing a 40mm Atricure clip. She tolerated the procedure and was transferred to the SICU in stable condition.  The patient was weaned and extubated on 08/14/2023.  She required support with IABP which was weaned to 1:3 on post operative day #1 and later removed.  She was on Epinephrine , Milrinone , and Levophed  all of which were weaned as hemodynamics allowed.  She was continued on PCN G for Strep Agalalactiae. Her blood sugars were variable and she was kept on insulin  drip until sugars stabilized.  She will be started on coumadin  for her mechanical mitral valve prosthesis.  She had throat irritation.  Prior to surgery she had failed SLP evaluation, we requested repeat evaluation after surgical extubation. They recommended placement of Cortrak and initiation of feeding. She was tolerating tube feeds at goal. They did say she could have sips of thin liquids.  She was weaned off insulin  drip to SSIP and Semglee  per endotool recommendation.  Chest xray showed increased edema/atelectasis of the right  chest post extubation.  Flutter valve was ordered.  Advanced heart failure team followed the patient closely.  Bedside Echocardiogram was obtained and showed  moderate RV dysfunction. She was given multiple doses of IV Hydralazine  for hypertension. She was given another bolus of IV Amiodarone  for atrial flutter, she did not convert to NSR so Amiodarone  gtt was started as well as Digoxin 0.125mg  daily. She was started on for Coumadin  for MVR. Midodrine  10mg  TID was started for blood pressure support while she was aggressively diuresed. Midodrine  was discontinued and she was at her preoperative weight. She was restarted on IV heparin  per pharmacy until her INR was closer to goal. She had expected postop acute blood loss anemia, she was transfused with 1U PRBCs. She was tolerating a diet, cortrak was removed. She underwent successful TEE/DCCV on 05/23 and converted to NSR. Milrinone  was discontinued.

## 2023-08-14 NOTE — Procedures (Signed)
 Extubation Procedure Note  Patient Details:   Name: Melissa Bray DOB: 1960/06/18 MRN: [id number]   Airway Documentation:    Vent end date: 08/14/23 Vent end time: 1543   Evaluation  O2 sats: stable throughout Complications: No apparent complications Patient did tolerate procedure well. Bilateral Breath Sounds: Clear, Diminished   Yes  Pt achieved a NIF of -27 and VC 1.1 with great pt effort on all attempts.   Pt extubated to 4L [place] per rapid wean protocol, Pt tolerating well at this time. Cuff leak present, no stridor noted, RN at bedside.   Elton Ham 08/14/2023, 3:44 PM

## 2023-08-14 NOTE — Discharge Summary (Signed)
 [address]       [name] [address]             [phone]    Physician Discharge Summary  Patient ID: Melissa Bray MRN: [id number] DOB/AGE: [birth year] 63 y.o.  Admit date: 07/31/2023 Discharge date: 08/29/2023  Admission Diagnoses:  Patient Active Problem List   Diagnosis Date Noted   Cardiogenic shock (HCC) 08/08/2023   Shock (HCC) 08/08/2023   Hypomagnesemia 08/06/2023   Hypokalemia 08/06/2023   Hyponatremia 08/06/2023   Sinus tachycardia 08/06/2023   Mitral valve endocarditis 08/03/2023   Severe sepsis (HCC) 08/01/2023   Cellulitis of left upper extremity 08/01/2023   Obesity, Class III, BMI 40-49.9 (morbid obesity) 08/01/2023   Perforation of leaflet of mitral valve 08/01/2023   GAD (generalized anxiety disorder) 08/01/2023   History of breast cancer 08/01/2023   SIRS (systemic inflammatory response syndrome) (HCC) 07/31/2023   Lymphedema of left upper extremity 05/27/2023   Long-term (current) use of injectable non-insulin  antidiabetic drugs 01/29/2023   HLD (hyperlipidemia) 01/29/2023   Nephrolithiasis 10/29/2022   Adenomatous colon polyp 10/29/2022   Colon polyp 01/05/2022   S/P hysterectomy 08/14/2021   Osteopenia 08/14/2021   Morbid obesity (HCC) 09/06/2020   DM2 (diabetes mellitus, type 2) (HCC) 07/15/2019   Neuropathy due to chemotherapeutic drug (HCC) 02/02/2018   Hematuria 02/02/2018   Macular drusen, bilateral 07/09/2017   GERD (gastroesophageal reflux disease) 04/28/2017   Long term (current) use of aromatase inhibitors 11/13/2015   Depression, recurrent (HCC) 09/22/2014   Essential hypertension 09/22/2014   Breast cancer, stage 3 (HCC) 08/31/2014   Discharge Diagnoses:  Patient Active Problem List   Diagnosis Date Noted   Physical deconditioning 08/21/2023   Acute respiratory failure with hypoxia (HCC) 08/21/2023   Acute bacterial endocarditis 08/21/2023   S/P MVR (mitral valve replacement) 08/13/2023   Cardiogenic  shock (HCC) 08/08/2023   Shock (HCC) 08/08/2023   Hypomagnesemia 08/06/2023   Hypokalemia 08/06/2023   Hyponatremia 08/06/2023   Sinus tachycardia 08/06/2023   Mitral valve endocarditis 08/03/2023   Severe sepsis (HCC) 08/01/2023   Cellulitis of left upper extremity 08/01/2023   Obesity, Class III, BMI 40-49.9 (morbid obesity) 08/01/2023   Perforation of leaflet of mitral valve 08/01/2023   GAD (generalized anxiety disorder) 08/01/2023   History of breast cancer 08/01/2023   SIRS (systemic inflammatory response syndrome) (HCC) 07/31/2023   Lymphedema of left upper extremity 05/27/2023   Long-term (current) use of injectable non-insulin  antidiabetic drugs 01/29/2023   HLD (hyperlipidemia) 01/29/2023   Nephrolithiasis 10/29/2022   Adenomatous colon polyp 10/29/2022   Colon polyp 01/05/2022   S/P hysterectomy 08/14/2021   Osteopenia 08/14/2021   Morbid obesity (HCC) 09/06/2020   DM2 (diabetes mellitus, type 2) (HCC) 07/15/2019   Neuropathy due to chemotherapeutic drug (HCC) 02/02/2018   Hematuria 02/02/2018   Macular drusen, bilateral 07/09/2017   GERD (gastroesophageal reflux disease) 04/28/2017   Long term (current) use of aromatase inhibitors 11/13/2015   Depression, recurrent (HCC) 09/22/2014   Essential hypertension 09/22/2014   Breast cancer, stage 3 (HCC) 08/31/2014   Discharged Condition: stable  History of Present Illness:     Melissa Bray is a 63 year old female with a past medical history of hypertension, class III obesity, hyperlipidemia, uncontrolled type 2 diabetes mellitus, generalized anxiety disorder, and breast cancer (s/p left mastectomy 2016). She presented to [facility] ED with complaints of weakness, worsening fatigue, chills, fever and lack of appetite for 5 days. She denied chest  pain, shortness of breath, nausea and vomiting. She also noticed increased swelling, erythema, warmth and tenderness of her left upper extremity above her elbow where she  chronically has lymphedema. Upon arrival to the ED she was febrile, tachycardic and BP was initially 95/65 prior to IV fluid administration. Blood cultures x 2 were drawn and she was started on IV Vancomycin  and IV Flagyl  for sepsis. She was then transferred to [facility] on 05/01 for admission for sepsis due to suspected LUE cellulitis at that time. LUE ultrasound was negative for acute DVT. Blood cultures were positive for streptococcus agalactiae. Vancomycin  and Flagyl  were transitioned to IV Cefazolin . The patient was found to have a murmur on exam. She was transferred to [facility] and underwent TTE on 05/04 which showed LVEF 65-70%, moderate to probable severe mitral regurgitation with a jet originating from the proximal portion of the anterior mitral valve leaflet suggesting a likely perforated anterior mitral valve leaflet. TEE on 08/04/23 showed LVEF 65-70%, a mobile vegetation on the anterior mitral leaflet with perforation as well as thickening and probable vegetation of the posterior leaflet, and severe eccentric mitral valve regurgitation.    The patient states her arm swelling and redness has resolved since she has been in the hospital. She denies current chest pain and is hoping to get surgery. She works as a Lawyer.   Dr. Luna Salinas reviewed the patient's diagnostic studies and determined she would benefit from surgical intervention. He reviewed the treatment options as well as the risks and benefits of surgery with the patient. Ms Smoot was agreeable to proceed with surgery.  Hospital Course:  The patient remained admitted to [facility], and developed respiratory failure and became nonresponsive requiring intubation on 05/09. Cardiac catheterization on 05/09 showed normal LV end diastolic pressure and mild pulmonary hypertension, IABP was placed without complication. She was extubated on 05/12 without complication. She remained stable and was as medically optimized  as possible and was taken to the operating room on 05/14. She underwent mitral valve replacement utilizing a 31/42mm Onyx mechanical valve and clipping of th left atrial appendage utilizing a 40mm Atricure clip. She tolerated the procedure and was transferred to the SICU in stable condition.  The patient was weaned and extubated on 08/14/2023.  She required support with IABP which was weaned to 1:3 on post operative day #1 and later removed.  She was on Epinephrine , Milrinone , and Levophed  all of which were weaned as hemodynamics allowed.  She was continued on PCN G for Strep Agalalactiae. Her blood sugars were variable and she was kept on insulin  drip until sugars stabilized.  She will be started on coumadin  for her mechanical mitral valve prosthesis.  She had throat irritation.  Prior to surgery she had failed SLP evaluation, we requested repeat evaluation after surgical extubation. They recommended placement of Cortrak and initiation of feeding. She was tolerating tube feeds at goal. They did say she could have sips of thin liquids.  She was weaned off insulin  drip to SSIP and Semglee  per endotool recommendation.  Chest xray showed increased edema/atelectasis of the right chest post extubation.  Flutter valve was ordered.  Advanced heart failure team followed the patient closely.  Bedside Echocardiogram was obtained and showed moderate RV dysfunction. She was given multiple doses of IV Hydralazine  for hypertension. She was given another bolus of IV Amiodarone  for atrial flutter, she did not convert to NSR so Amiodarone  gtt was started as well as Digoxin  0.125mg  daily. She was  started on for Coumadin  for MVR. Midodrine  10mg  TID was started for blood pressure support while she was aggressively diuresed. Midodrine  was discontinued and she was at her preoperative weight. She was restarted on IV heparin  per pharmacy until her INR was closer to goal. She had expected postop acute blood loss anemia, she was transfused  with 1U PRBCs. She was tolerating a diet, cortrak was removed. She underwent successful TEE/DCCV on 05/23 and converted to NSR. Milrinone  was discontinued.  On postop day 10 she was transferred to the floor.  Heparin  has also been discontinued.  INR and Coumadin  dosing  have been monitored by pharmacy who is adjusting doses. INR became elevated at 4.5>5.1>4.0, coumadin  was held. She remained stable without signs of bleeding. She was hyperkalemic, spironolactone  and potassium supplement was discontinued and this resolved with time. She was found to have superficial dehiscence of her sternal wound which was cleaned and sharply debrided at bedside. Wound care was consulted and cleaned with vashe and aquacel and foam dressing was placed and recommended to change daily and PRN.  Advanced heart failure team started patient on Coreg , Demadex , Spironolactone , and Entresto .  She was given 6mg  Coumadin  on 05/29 and INR remained 4. Coumadin  will be held and pharmacy will continue to manage this while the patient is in CIR. She was felt stable for discharge to CIR and a bed was available on 08/29/23.  Consults: cardiology  Significant Diagnostic Studies: TRANSESOPHOGEAL ECHO REPORT       Patient Name:   ANAYANSI RUNDQUIST Date of Exam: 08/04/2023  Medical Rec #:  [id number]     Height:       65.0 in  Accession #:    [id number]    Weight:       270.9 lb  Date of Birth:  [birth year]     BSA:          2.251 m  Patient Age:    62 years      BP:           120/80 mmHg  Patient Gender: F             HR:           121 bpm.  Exam Location:  Inpatient   Procedure: Transesophageal Echo, 3D Echo, Cardiac Doppler and Color  Doppler            (Both Spectral and Color Flow Doppler were utilized during             procedure).   Indications:     Mitral Valve Abnormality    History:         Patient has prior history of Echocardiogram examinations,  most                  recent 08/03/2023. Mitral Valve Disease,   Signs/Symptoms:Murmur;                  Risk Factors:Diabetes and Hypertension.    Sonographer:     Astrid Blamer  Referring Phys:  [id number] Lemmings HILTY  Diagnosing Phys: Dinah Franco MD   PROCEDURE: After discussion of the risks and benefits of a TEE, an  informed consent was obtained from the patient. The transesophogeal probe  was passed without difficulty through the esophogus of the patient.  Sedation performed by different physician.  The patient was monitored while under deep sedation. Anesthestetic  sedation was provided intravenously by Anesthesiology: 220mg  of Propofol ,  100mg  of Lidocaine . The  patient developed no complications during the  procedure.    IMPRESSIONS     1. Left ventricular ejection fraction, by estimation, is 65 to 70%. The  left ventricle has hyperdynamic function.   2. Right ventricular systolic function is normal. The right ventricular  size is normal.   3. No left atrial/left atrial appendage thrombus was detected. The LAA  emptying velocity was 65 cm/s.   4. Mobile vegetation noted on the anterior mitral leaflet with  perforation of the leaflet. There is thickening and probably vegetation of  the posterior leaflet as well. The mitral valve is abnormal. Severe mitral  valve regurgitation. No evidence of  mitral stenosis.   5. The aortic valve is tricuspid. Aortic valve regurgitation is not  visualized.   6. Agitated saline contrast bubble study was negative, with no evidence  of any interatrial shunt.   7. 3D performed of the mitral valve and demonstrates Endocarditis with  perforation of the anterior leaflet and severe eccentric MR.   Conclusion(s)/Recommendation(s): Findings are concerning for  vegetation/infective endocarditis as detailed above. Findings concerning  for mitral valve vegetation. Cardiology evaluation and eventual CT  surgical evaluation recommended. Would be best to  transfer to [facility] for further work-up.   FINDINGS    Left Ventricle: Left ventricular ejection fraction, by estimation, is 65  to 70%. The left ventricle has hyperdynamic function. The left ventricular  internal cavity size was normal in size. There is no left ventricular  hypertrophy.   Right Ventricle: The right ventricular size is normal. No increase in  right ventricular wall thickness. Right ventricular systolic function is  normal.   Left Atrium: Left atrial size was normal in size. No left atrial/left  atrial appendage thrombus was detected. The LAA emptying velocity was 65  cm/s.   Right Atrium: Right atrial size was normal in size.   Pericardium: There is no evidence of pericardial effusion.   Mitral Valve: Mobile vegetation noted on the anterior mitral leaflet with  perforation of the leaflet. There is thickening and probably vegetation of  the posterior leaflet as well. The mitral valve is abnormal. Severe mitral  valve regurgitation, with  eccentric anteriorly directed jet. No evidence of mitral valve stenosis.  Pulmonary venous flow shows systolic flow reversal.   Tricuspid Valve: The tricuspid valve is grossly normal. Tricuspid valve  regurgitation is mild.   Aortic Valve: The aortic valve is tricuspid. Aortic valve regurgitation is  not visualized.   Pulmonic Valve: The pulmonic valve was grossly normal. Pulmonic valve  regurgitation is trivial.   Aorta: The aortic root and ascending aorta are structurally normal, with  no evidence of dilitation.   Venous: The left upper pulmonary vein is normal.   IAS/Shunts: The interatrial septum is aneurysmal. There is right bowing of  the interatrial septum, suggestive of elevated left atrial pressure. No  atrial level shunt detected by color flow Doppler. Agitated saline  contrast was given intravenously to  evaluate for intracardiac shunting. Agitated saline contrast bubble study  was negative, with no evidence of any interatrial shunt.   Additional Comments: 3D was  performed not requiring image post processing  on an independent workstation and was abnormal.   Dinah Franco MD  Electronically signed by Dinah Franco MD  Signature Date/Time: 08/04/2023/1:31:14 PM        Final     RIGHT/LEFT HEART CATH AND CORONARY ANGIOGRAPHY  IABP Insertion     LV end diastolic pressure is normal.   Hemodynamic findings  consistent with mild pulmonary hypertension.   Normal coronary anatomy. Normal LV filling pressures. LVEDP 12 mm Hg. PCWP 9/10, mean 11 mm Hg Mild pulmonary HTN. PAP 34/21, mean 27 mm Hg Cardiac output 4.74 L/min, index 2.14 5.   Successful placement of IABP  Treatments: surgery:  Operative Report    DATE OF PROCEDURE: 08/13/2023   PREOPERATIVE DIAGNOSIS: Severe mitral regurgitation secondary to endocarditis.   POSTOPERATIVE DIAGNOSIS: Severe mitral regurgitation secondary to endocarditis.   PROCEDURE: Median sternotomy, extracorporeal circulation, mitral valve replacement with Onyx 31/33 mechanical prosthesis (reference Onxm, serial number [id number]), left atrial appendage clip using AtriCure AtriClip Pro 240 lot #[id number] 40 mm.   SURGEON: Milon Aloe. Luna Salinas, MD.   Discharge Exam: Blood pressure (!) 143/97, pulse 88, temperature 98.8 F (37.1 C), temperature source Oral, resp. rate 17, height 5\' 5"  (1.651 m), weight 115.1 kg, SpO2 95%. General appearance: alert, cooperative, and no distress Neurologic: intact Heart: regular rate and rhythm, systolic murmur, no rubs Lungs: clear to auscultation bilaterally Abdomen: soft, non-tender; bowel sounds normal; no masses,  no organomegaly Extremities: edema trace BLE Wound: Inferior portion of sternal incision and chest tube sites with superficial dehiscence, improving. No sign of infection  Discharge Medications:  The patient has been discharged on:   1.Beta Blocker:  Yes [ X  ]                              No   [   ]                              If No, reason:   2.Ace  Inhibitor/ARB: Yes [  X ]                                     No  [    ]                                     If No, reason:   3.Statin:   Yes [  X ]                  No  [   ]                  If No, reason: Home dose of pravastatin , no CAD  4.Libby Ree:  Yes  [   ]                  No   [  X ]                  If No, reason: On Coumadin  with elevated INR, no CAD  Patient had ACS upon admission: No  Plavix/P2Y12 inhibitor: Yes [   ]                                      No  [ X ] On Coumadin      Discharge Instructions     Amb Referral to Cardiac Rehabilitation   Complete by: As directed    Diagnosis: Valve Replacement   Valve: Mitral   After initial evaluation  and assessments completed: Virtual Based Care may be provided alone or in conjunction with Phase 2 Cardiac Rehab based on patient barriers.: Yes   Intensive Cardiac Rehabilitation (ICR) [facility] location only OR Traditional Cardiac Rehabilitation (TCR) *If criteria for ICR are not met will enroll in TCR ([facility] only): Yes      Allergies as of 08/29/2023   No Known Allergies      Medication List     STOP taking these medications    amLODipine  10 MG tablet Commonly known as: NORVASC    cloNIDine  0.1 MG tablet Commonly known as: CATAPRES    lisinopril  40 MG tablet Commonly known as: ZESTRIL    metoprolol  succinate 100 MG 24 hr tablet Commonly known as: TOPROL -XL       TAKE these medications    alendronate  70 MG tablet Commonly known as: Fosamax  Take 1 tablet (70 mg total) by mouth once a week. Take with a full glass of water on an empty stomach.   amiodarone  200 MG tablet Commonly known as: PACERONE  Take 1 tablet (200 mg total) by mouth daily. Start taking on: Aug 30, 2023   anastrozole  1 MG tablet Commonly known as: ARIMIDEX  Take 1 mg by mouth daily.   carvedilol  3.125 MG tablet Commonly known as: COREG  Take 1 tablet (3.125 mg total) by mouth 2 (two) times daily with a meal.   clonazePAM  0.5 MG  tablet Commonly known as: KLONOPIN  Take 1 tablet (0.5 mg total) by mouth 2 (two) times daily as needed (anxiety).   dapagliflozin  propanediol 10 MG Tabs tablet Commonly known as: FARXIGA  Take 1 tablet (10 mg total) by mouth daily. Start taking on: Aug 30, 2023   feeding supplement Liqd Take 237 mLs by mouth 2 (two) times daily between meals.   fiber supplement (BANATROL TF) liquid Take 60 mLs by mouth 4 (four) times daily.   FreeStyle Libre 3 Plus Sensor Misc Inject 1 Units into the skin every 14 (fourteen) days. Change sensor every 15 days.   Gerhardt's butt cream Crea Apply 1 Application topically daily as needed for irritation.   glipiZIDE  10 MG 24 hr tablet Commonly known as: GLUCOTROL  XL Take 1 tablet (10 mg total) by mouth daily with breakfast.   insulin  glargine 100 UNIT/ML Solostar Pen Commonly known as: LANTUS  Inject 20 Units into the skin daily.   melatonin 5 MG Tabs Take 1 tablet (5 mg total) by mouth at bedtime as needed.   metFORMIN  850 MG tablet Commonly known as: GLUCOPHAGE  Take 1 tablet (850 mg total) by mouth 2 (two) times daily with a meal.   oxyCODONE  5 MG immediate release tablet Commonly known as: Oxy IR/ROXICODONE  Take 1 tablet (5 mg total) by mouth every 6 (six) hours as needed for severe pain (pain score 7-10).   pantoprazole  40 MG tablet Commonly known as: PROTONIX  Take 1 tablet (40 mg total) by mouth daily.   Pen Needles 31G X 6 MM Misc 1 application  by Does not apply route at bedtime.   penicillin  G potassium 12 Million Units in sodium chloride  0.9 % 500 mL Inject 12 Million Units into the vein every 12 (twelve) hours.   pravastatin  20 MG tablet Commonly known as: PRAVACHOL  Take 1 tablet (20 mg total) by mouth daily.   sacubitril -valsartan  49-51 MG Commonly known as: ENTRESTO  Take 1 tablet by mouth 2 (two) times daily.   senna-docusate 8.6-50 MG tablet Commonly known as: Senokot-S Take 1 tablet by mouth at bedtime as needed for  mild constipation.  spironolactone  25 MG tablet Commonly known as: ALDACTONE  Take 0.5 tablets (12.5 mg total) by mouth daily. Start taking on: Aug 30, 2023   torsemide  20 MG tablet Commonly known as: DEMADEX  Take 1 tablet (20 mg total) by mouth daily. Start taking on: Aug 30, 2023   Vitamin D  (Ergocalciferol ) 1.25 MG (50000 UNIT) Caps capsule Commonly known as: DRISDOL  TAKE 1 CAPSULE ONCE A WEEK FOR 12 WEEKS. AFTER 12 WEEKS SWITCH TO OVER THE COUNTER VITAMIN D  2000 IU   warfarin 2 MG tablet Commonly known as: COUMADIN  Take 1 tablet (2 mg total) by mouth daily. Start taking on: Aug 30, 2023        Follow-up Information     [facility] at [facility] [place] Follow up on 09/25/2023.   Specialty: Cardiology Why: Echocardiogram is at 9:15AM Contact information: [address] [place]  [address] [phone]        [facility] [place] Follow up on 09/30/2023.   Specialty: Cardiothoracic Surgery Why: Appointment is at 11:00, please get CXR at 10:00 on 2nd floor of our office building prior to your appointment with Dr. Audree Bless office Contact information: [address] [place]  [address] [phone]        Gerald Kitty., NP Follow up on 09/18/2023.   Specialty: Cardiology Why: Cardiology appointment is at 2:45PM Contact information: [address] [place] [address] [phone]         [facility] Follow up on 09/19/2023.   Specialty: Cardiology Why: Heart Failure follow up appointment is at 11:30AM Contact information: [address] [place]   [address] [phone]        Jamesetta Mcbride, MD Follow up on 09/19/2023.   Specialty: Infectious Diseases Why: Infectious disease is at 9:30AM Contact information: [address] [place] [address] [place] [address] [phone]                  Signed:  Randa Burton, PA-C  08/29/2023, 2:06 PM

## 2023-08-14 NOTE — TOC Initial Note (Addendum)
 Transition of Care [facility]) - Initial/Assessment Note    Patient Details  Name: Melissa Bray MRN: [id number] Date of Birth: 22-Jun-1960  Transition of Care [facility]) CM/SW Contact:    Murphy Arn, LCSWA Phone Number:[phone] 08/14/2023, 1:56 PM  Clinical Narrative:    1:55 PM- HF CSW called the patients son, Bambi Lever and left a VM asking to be called back. CSW will continue to make attempts to make contact.   2:22 PM- HF CSW received a phone call back from patients son. Patients son stated that patient has no history of HH services. Patient does not use any equipment. Patient son stated that they have a scale at home. Patients son stated that prior to this hospitalization she was working. Patients son stated that prior to this hospitalization she was also working. Patients son stated that his mother left her STD paperwork with a nurse two days ago, and wanted to know if has been completed. CSW inquired the patients bedside nurse via secure chat about paperwork status. Patient has a PCP. CSW explained that a hospital follow up appointment will be scheduled closer towards dc. Patients son will provide transportation at dc.   Bedside RN confirmed that they have FMLA paperwork and working on getting it completed.   TOC will continue following.                Expected Discharge Plan: Home/Self Care Barriers to Discharge: Continued Medical Work up   Patient Goals and CMS Choice            Expected Discharge Plan and Services                                              Prior Living Arrangements/Services                       Activities of Daily Living   ADL Screening (condition at time of admission) Independently performs ADLs?: Yes (appropriate for developmental age) Is the patient deaf or have difficulty hearing?: No Does the patient have difficulty seeing, even when wearing glasses/contacts?: No Does the patient have difficulty concentrating, remembering,  or making decisions?: No  Permission Sought/Granted                  Emotional Assessment              Admission diagnosis:  SIRS (systemic inflammatory response syndrome) (HCC) [R65.10] Sepsis, due to unspecified organism, unspecified whether acute organ dysfunction present (HCC) [A41.9] Severe sepsis (HCC) [A41.9, R65.20] S/P MVR (mitral valve replacement) [Z95.2] Patient Active Problem List   Diagnosis Date Noted   S/P MVR (mitral valve replacement) 08/13/2023   Cardiogenic shock (HCC) 08/08/2023   Shock (HCC) 08/08/2023   Hypomagnesemia 08/06/2023   Hypokalemia 08/06/2023   Hyponatremia 08/06/2023   Sinus tachycardia 08/06/2023   Mitral valve endocarditis 08/03/2023   Severe sepsis (HCC) 08/01/2023   Cellulitis of left upper extremity 08/01/2023   Obesity, Class III, BMI 40-49.9 (morbid obesity) 08/01/2023   Perforation of leaflet of mitral valve 08/01/2023   GAD (generalized anxiety disorder) 08/01/2023   History of breast cancer 08/01/2023   SIRS (systemic inflammatory response syndrome) (HCC) 07/31/2023   Lymphedema of left upper extremity 05/27/2023   Long-term (current) use of injectable non-insulin  antidiabetic drugs 01/29/2023   HLD (hyperlipidemia) 01/29/2023   Nephrolithiasis 10/29/2022  Adenomatous colon polyp 10/29/2022   Colon polyp 01/05/2022   S/P hysterectomy 08/14/2021   Osteopenia 08/14/2021   Morbid obesity (HCC) 09/06/2020   DM2 (diabetes mellitus, type 2) (HCC) 07/15/2019   Neuropathy due to chemotherapeutic drug (HCC) 02/02/2018   Hematuria 02/02/2018   Macular drusen, bilateral 07/09/2017   GERD (gastroesophageal reflux disease) 04/28/2017   Long term (current) use of aromatase inhibitors 11/13/2015   Depression, recurrent (HCC) 09/22/2014   Essential hypertension 09/22/2014   Breast cancer, stage 3 (HCC) 08/31/2014   PCP:  Kandace Organ, NP Pharmacy:   [id number] - [place], [place] - [address] [place] [address] Phone: [phone] Fax: [phone]  [facility] [place], [place] - [address] [place] [address] Phone: [phone] Fax: [phone]  [id number] - [place], [place] - [address] AT [facility] [address] [place] [address] Phone: [phone] Fax: [phone]     Social Drivers of Health (SDOH) Social History: SDOH Screenings   Food Insecurity: No Food Insecurity (07/31/2023)  Housing: Low Risk  (07/31/2023)  Transportation Needs: No Transportation Needs (07/31/2023)  Utilities: Not At Risk (07/31/2023)  Depression (PHQ2-9): Low Risk  (06/27/2023)  Tobacco Use: Low Risk  (08/08/2023)   SDOH Interventions:     Readmission Risk Interventions     No data to display

## 2023-08-14 NOTE — Progress Notes (Addendum)
 Patient ID: Melissa Bray, female   DOB: [birth year], 63 y.o.   MRN: [id number]     Advanced Heart Failure Rounding Note  Cardiologist: Eilleen Grates, MD  Chief Complaint: CHF Subjective:    POD#1 MVR  Co-ox 60. CI 1.8 (obtained by Dr. Julane Ny) with IABP 1:3. On Epi 2 + milrinone 0.25. AP 86 bpm. PAPi severely down. Bedside echo with RV mod dysfunction by Dr. Julane Ny read.   On Epi 2 + Milrinone 0.25 mcg/kg/min + IABP 1:3  Swan #s:  PAP: (23-43)/(19-37) 26/21 CVP:  [0 mmHg-29 mmHg] 11 mmHg CO:  [3.3 L/min-6.2 L/min] 4.8 L/min CI:  [1.5 L/min/m2-2.8 L/min/m2] 2.16 L/min/m2 SVR 1083 PAPi 0.6  Intubated/sedated.   Objective:    Weight Range: 122.7 kg Body mass index is 45.01 kg/m.   Vital Signs:   Temp:  [96.3 F (35.7 C)-100 F (37.8 C)] 99.9 F (37.7 C) (05/15 1140) Pulse Rate:  [74-292] 99 (05/15 1140) Resp:  [6-28] 21 (05/15 1140) BP: (110)/(68) 110/68 (05/14 1143) SpO2:  [92 %-100 %] 99 % (05/15 1140) Arterial Line BP: (70-191)/(27-109) 119/53 (05/15 1140) FiO2 (%):  [40 %-50 %] 40 % (05/15 1042) Weight:  [122.7 kg] 122.7 kg (05/15 0500) Last BM Date : 08/12/23  Weight change: Filed Weights   08/11/23 0500 08/13/23 0500 08/14/23 0500  Weight: 119 kg 115.5 kg 122.7 kg   Intake/Output:  Intake/Output Summary (Last 24 hours) at 08/14/2023 1140 Last data filed at 08/14/2023 1100 Gross per 24 hour  Intake 7346.2 ml  Output 3925 ml  Net 3421.2 ml    Physical Exam   CVP 11 General: Sedated.  Cardiac: S1 and S2 present.  Extremities: Warm and dry.  No peripheral edema.  Neuro: Sedated Lines/Devices:  R fem PAC + art sheath, IABP, foley  Telemetry   AP at 86 (personally reviewed), hemodynamically tolerate lower native rate  Labs    CBC Recent Labs    08/13/23 1749 08/14/23 0400 08/14/23 0404  WBC 9.8 10.3  --   NEUTROABS 7.4  --   --   HGB 10.2*  9.6* 9.4* 9.2*  HCT 30.0*  28.4* 29.0* 27.0*  MCV 86.9 87.6  --   PLT 105* 139*  --     Basic Metabolic Panel Recent Labs    25/95/63 1751 08/13/23 0500 08/13/23 1231 08/13/23 1551 08/13/23 1749 08/14/23 0400 08/14/23 0404  NA 135 134*   < > 134*   < > 134* 133*  K 4.1 3.8   < > 4.1   < > 5.0 5.2*  CL 95* 95*   < > 96*  --  102  --   CO2 30 30  --   --   --  23  --   GLUCOSE 134* 135*   < > 136*  --  254*  --   BUN 9 8   < > 10  --  10  --   CREATININE 0.94 0.94   < > 0.80  --  0.95  --   CALCIUM 8.9 9.1  --   --   --  9.0  --   MG 2.1 2.0  --   --   --  2.5*  --   PHOS 3.1 3.8  --   --   --   --   --    < > = values in this interval not displayed.   Imaging   DG Chest Port 1 View Result Date: 08/14/2023 CLINICAL DATA:  [id number].  Mitral valve replacement surgery. EXAM: PORTABLE CHEST 1 VIEW COMPARISON:  Portable chest yesterday at 6:19 p.m. FINDINGS: 5:57 a.m. ETT tip is 2.9 cm from the carina. NGT curves left in the stomach with the tip proximal fundal area. Right IJ Swan-Ganz catheter again terminates to the right probably in the main lower lobe artery. Left IJ line tip is at the superior cavoatrial junction. There are stable postsurgical changes with AVR, left atrial appendage clip, sternotomy sutures. There is a right chest tube and at least 1 mediastinal drain to the right. Stable cardiomediastinal silhouette with superior widening, mild cardiomegaly. Normal caliber central vessels. Stable appearance of low lung volumes and bibasilar atelectasis. Remaining lungs are clear. Overall aeration seems unchanged.  No new abnormality. IMPRESSION: 1. Stable appearance of the chest with low lung volumes and bibasilar atelectasis. No new abnormality. 2. Stable support apparatus. 3. Stable cardiomediastinal silhouette. Electronically Signed   By: Denman Fischer M.D.   On: 08/14/2023 08:02   DG Chest Port 1 View Result Date: 08/13/2023 CLINICAL DATA:  [id number] S/P MVR (mitral valve replacement) [id number] EXAM: PORTABLE CHEST - 1 VIEW COMPARISON:  Aug 13, 2023 FINDINGS: Low lung  volumes. Mild elevation the right hemidiaphragm. Streaky bibasilar atelectasis. No pleural effusion or pneumothorax. Mild cardiomegaly. Mitral valve replacement with left atrial appendage clip. Endotracheal tube terminates in the mid trachea. Sternotomy wires. Right IJ introducer sheath with a pulmonary artery catheter in the right hilar region. Left IJ approach central venous catheter terminates in the lower SVC. Esophagogastric tube terminating in the stomach. Mediastinal drain and right-sided thoracostomy tube are well-positioned. IMPRESSION: 1. Low lung volumes with streaky bibasilar atelectasis. Minimal atelectasis in the left upper lung zone. No pleural effusion or pneumothorax. 2. Similar positioning of the support tubes and lines, as described above. Electronically Signed   By: Rance Burrows M.D.   On: 08/13/2023 18:54   DG Chest Port 1 View Result Date: 08/13/2023 CLINICAL DATA:  Intraoperative evaluation, incorrect instrument count EXAM: PORTABLE CHEST 1 VIEW COMPARISON:  08/12/2023 FINDINGS: Two frontal views of the chest are obtained. Endotracheal tube overlies tracheal air column, tip 2.3 cm above carina. Left internal jugular catheter tip overlies superior vena cava. Right internal jugular flow directed catheter tip overlies the main pulmonary outflow tract. Endoscope overlies the thoracic esophagus, tip just proximal to the gastroesophageal junction. Mitral valve prosthesis, numerous mediastinal surgical clips, and left atrial appendage clip identified. No unexpected radiopaque foreign body. The cardiac silhouette is unremarkable. Chronic elevation of the right hemidiaphragm, with consolidation at the right lung base consistent with atelectasis. No effusion or pneumothorax. IMPRESSION: 1. Intraoperative evaluation with numerous support devices as above. No unexpected radiopaque foreign body. 2. Right basilar consolidation consistent with atelectasis. Findings were called to operating room 14 at  4:19 p.m. by myself. Electronically Signed   By: Bobbye Burrow M.D.   On: 08/13/2023 16:24    Medications:    Scheduled Medications:  acetaminophen   1,000 mg Oral Q6H   Or   acetaminophen  (TYLENOL ) oral liquid 160 mg/5 mL  1,000 mg Per Tube Q6H   anastrozole   1 mg Oral Daily   aspirin  EC  81 mg Oral Daily   bisacodyl  10 mg Oral Daily   Or   bisacodyl  10 mg Rectal Daily   Chlorhexidine  Gluconate Cloth  6 each Topical Daily   docusate sodium  200 mg Oral Daily   metoCLOPramide (REGLAN) injection  10 mg Intravenous Q6H   mouth rinse  15 mL Mouth  Rinse Q2H   [START ON 08/15/2023] pantoprazole  40 mg Oral Daily   pantoprazole (PROTONIX) IV  40 mg Intravenous QHS   sodium chloride  flush  3 mL Intravenous Q12H   sodium chloride  flush  3-10 mL Intravenous Q12H   Infusions:  sodium chloride  10 mL/hr at 08/13/23 1823   sodium chloride  10 mL/hr at 08/14/23 1100   albumin  human Stopped (08/14/23 0952)   amiodarone 30 mg/hr (08/14/23 1100)   dexmedetomidine (PRECEDEX) IV infusion 0.9 mcg/kg/hr (08/14/23 1100)   epinephrine  3 mcg/min (08/14/23 1100)   insulin  2.4 Units/hr (08/14/23 1100)   lactated ringers      lactated ringers  20 mL/hr at 08/14/23 1100   milrinone 0.25 mcg/kg/min (08/14/23 1100)   niCARDipine     norepinephrine (LEVOPHED) Adult infusion Stopped (08/14/23 0801)   penicillin  G potassium 12 Million Units in dextrose  5 % 500 mL CONTINUOUS infusion     pencillin G potassium IV     phenylephrine  (NEO-SYNEPHRINE) Adult infusion     PRN Medications: sodium chloride , albumin  human, dextrose , metoprolol  tartrate, midazolam , morphine injection, ondansetron  (ZOFRAN ) IV, mouth rinse, oxyCODONE , sodium chloride  flush, sodium chloride  flush, traMADol   Assessment/Plan   1. Cardiogenic shock: Valvular shock from severe MR.  TEE on 5/5 showed LV EF 65%, normal RV function, severe MR with perforated leaflet and mobile vegetation.  Patient developed flash pulmonary edema 5/9 in setting  of marked hypertension.  Remains intubated.  - Co-ox 60%. On Epi 2+ milrinone 0.25 mcg/kg/min. IABP at 3:1. CI down to 1.8. Will increase Epi for goal CI >2.0 - AP at 86, hemodynamics do not favor native rhythm - POCUS by Dr. Julane Ny with mod reduced RV and ok LV. - Give Lasix 40mg  IV once today - Will plan to pull IABP once CI >2  2. Mitral regurgitation: Severe on TEE with perforated anterior leaflet and mitral valve vegetation.  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  - s/p MVR with Dr. Luna Salinas - post-op antibiotics per TCTS  3. Acute hypercarbic/hypoxemic respiratory failure. Increasing pulm edema and atelectasis on CXR.  - Continue diuresis as above.  - intubated. Vent management per TCTS. CT in place.   CRITICAL CARE Performed by: [place] Lee  Total critical care time: 11 minutes  Critical care time was exclusive of separately billable procedures and treating other patients.  Critical care was necessary to treat or prevent imminent or life-threatening deterioration.  Critical care was time spent personally by me on the following activities: development of treatment plan with patient and/or surrogate as well as nursing, discussions with consultants, evaluation of patient's response to treatment, examination of patient, obtaining history from patient or surrogate, ordering and performing treatments and interventions, ordering and review of laboratory studies, ordering and review of radiographic studies, pulse oximetry and re-evaluation of patient's condition.  Length of Stay: 82  [place] Lee, NP  08/14/2023, 11:40 AM  Advanced Heart Failure Team Pager [phone] (M-F; 7a - 5p)  Please contact [facility] for night-coverage after hours (5p -7a ) and weekends on [email or web address]  Agree with above  Remains intubated. Will awake and nod her head  Remains on IABP. Co-ox 60% on EPI and milrinone  Swan numbers done personally CI 1.8 PAPI 0.6  I performed POCUS  echo EF 60-65% RV mod to severely down   A pacing  General:  On vent HEENT: +ETT Neck: swan in RIJ  Cor: sternal dressing Regular rate & rhythm. Mech s2 Lungs: clear Abdomen:obese  soft, nontender, nondistended.  Extremities:  no cyanosis, clubbing, rash, edema RFA IABP Neuro: sedated   She remains intubated.   Hemodynamics most c/w RV failure. Will increase epi slightly. Continue milrinone. Remove IABP Diurese gently.   CRITICAL CARE Performed by: Jules Oar  Total critical care time: 50 minutes  Critical care time was exclusive of separately billable procedures and treating other patients.  Critical care was necessary to treat or prevent imminent or life-threatening deterioration.  Critical care was time spent personally by me (independent of midlevel providers or residents) on the following activities: development of treatment plan with patient and/or surrogate as well as nursing, discussions with consultants, evaluation of patient's response to treatment, examination of patient, obtaining history from patient or surrogate, ordering and performing treatments and interventions, ordering and review of laboratory studies, ordering and review of radiographic studies, pulse oximetry and re-evaluation of patient's condition.  Jules Oar, MD  4:31 PM

## 2023-08-14 NOTE — Discharge Instructions (Signed)

## 2023-08-14 NOTE — Progress Notes (Addendum)
 1 Day Post-Op Procedure(s) (LRB): MITRAL VALVE REPLACEMENT USING ON-X PROSTHETIC MITRAL HEART VALVE SIZE 31/33MM (N/A) ECHOCARDIOGRAM, TRANSESOPHAGEAL, INTRAOPERATIVE (N/A) CLIPPING, LEFT ATRIAL APPENDAGE USING ATRICURE LAA EXCLUSION SYSTEM SIZE 40 Subjective: Intubated, sedated.  Has woken up and followed commands  Objective: Vital signs in last 24 hours: Temp:  [96.3 F (35.7 C)-100 F (37.8 C)] 99.3 F (37.4 C) (05/15 0700) Pulse Rate:  [74-292] 105 (05/15 0700) Cardiac Rhythm: Atrial paced (05/15 0400) Resp:  [0-26] 18 (05/15 0700) BP: (108-118)/(64-73) 110/68 (05/14 1143) SpO2:  [92 %-100 %] 98 % (05/15 0700) Arterial Line BP: (70-180)/(27-109) 102/57 (05/15 0700) FiO2 (%):  [40 %-50 %] 40 % (05/15 0400) Weight:  [122.7 kg] 122.7 kg (05/15 0500)  Hemodynamic parameters for last 24 hours: PAP: (23-46)/(14-37) 27/23 CVP:  [0 mmHg-29 mmHg] 12 mmHg CO:  [3.3 L/min-9.5 L/min] 4 L/min CI:  [1.5 L/min/m2-4.3 L/min/m2] 1.8 L/min/m2  Intake/Output from previous day: 05/14 0701 - 05/15 0700 In: 6734.1 [I.V.:3641.9; Blood:460; NG/GT:120; IV Piggyback:2512.2] Out: 3970 [Urine:2310; Emesis/NG output:200; Blood:1000; Chest Tube:460] Intake/Output this shift: No intake/output data recorded.  General appearance: intubated Neurologic: sedated Heart: regular rate and rhythm and paced Lungs: diminished breath sounds right base Abdomen: soft Extremities: well perfused  Lab Results: Recent Labs    08/13/23 1749 08/14/23 0400 08/14/23 0404  WBC 9.8 10.3  --   HGB 10.2*  9.6* 9.4* 9.2*  HCT 30.0*  28.4* 29.0* 27.0*  PLT 105* 139*  --    BMET:  Recent Labs    08/13/23 0500 08/13/23 1231 08/13/23 1551 08/13/23 1749 08/14/23 0400 08/14/23 0404  NA 134*   < > 134*   < > 134* 133*  K 3.8   < > 4.1   < > 5.0 5.2*  CL 95*   < > 96*  --  102  --   CO2 30  --   --   --  23  --   GLUCOSE 135*   < > 136*  --  254*  --   BUN 8   < > 10  --  10  --   CREATININE 0.94   < > 0.80   --  0.95  --   CALCIUM 9.1  --   --   --  9.0  --    < > = values in this interval not displayed.    PT/INR:  Recent Labs    08/13/23 1802  LABPROT 16.2*  INR 1.3*   ABG    Component Value Date/Time   PHART 7.393 08/14/2023 0404   HCO3 24.7 08/14/2023 0404   TCO2 26 08/14/2023 0404   ACIDBASEDEF 1.0 08/08/2023 1634   O2SAT 59.9 08/14/2023 0415   CBG (last 3)  Recent Labs    08/14/23 0403 08/14/23 0524 08/14/23 0641  GLUCAP 240* 252* 232*    Assessment/Plan: S/P Procedure(s) (LRB): MITRAL VALVE REPLACEMENT USING ON-X PROSTHETIC MITRAL HEART VALVE SIZE 31/33MM (N/A) ECHOCARDIOGRAM, TRANSESOPHAGEAL, INTRAOPERATIVE (N/A) CLIPPING, LEFT ATRIAL APPENDAGE USING ATRICURE LAA EXCLUSION SYSTEM SIZE 40 POD # 1  NEURO- sedated at present has woken and followed commands  Lighten sedation CV- s/p MVR mechanical valve  In bigeminy, A paced at 86  CI 1.8, co-ox 60 on milrinone 0.25, epi 4, norepi 2  Continue milrinone, epi, titrate norepi to BP IABP 1:2- will decrease to 1:3 and hopefully remove later this AM  Will need to start warfarin once IABP out ID- mitral endocarditis, Group B strep  Long term ceftriaxone   Vanco periop  RESP- on 40%/ 5 PEEP 7.39/40/103 ` Wean vent and attempt to extubate  RLL atelectasis and effusion- right pleural tube in place RENAL- creatinine, K 5  Will need diuresis ENDO- CBG highly variable and significantly elevated  Continue insulin  drip, transition once on a more stable rate GI- advance diet once extubated SCD for DVT prophylaxis   No enoxaparin this AM due to IABP  Can start tomorrow until INR > 2 Keep CT today  LOS: 12 days    Melissa Bray 08/14/2023

## 2023-08-14 NOTE — Progress Notes (Signed)
 20fr Balloon Pump pulled from Right Femoral Artery @1445 . Manual pressure held for 45 mins. Tegaderm and gauze applied to site. 18fr sheath pulled from right femoral vein, manual pressure held for 15 mins. Site level 0, No hematoma, No bleeding. Bedrest begins at 3:35. Pt given post sheath pull instructions.

## 2023-08-15 ENCOUNTER — Inpatient Hospital Stay (HOSPITAL_COMMUNITY)

## 2023-08-15 DIAGNOSIS — I059 Rheumatic mitral valve disease, unspecified: Secondary | ICD-10-CM | POA: Diagnosis not present

## 2023-08-15 DIAGNOSIS — J9601 Acute respiratory failure with hypoxia: Secondary | ICD-10-CM | POA: Diagnosis not present

## 2023-08-15 DIAGNOSIS — J9602 Acute respiratory failure with hypercapnia: Secondary | ICD-10-CM | POA: Diagnosis not present

## 2023-08-15 DIAGNOSIS — I2699 Other pulmonary embolism without acute cor pulmonale: Secondary | ICD-10-CM | POA: Diagnosis not present

## 2023-08-15 LAB — CBC
HCT: 26.7 % — ABNORMAL LOW (ref 36.0–46.0)
HCT: 30.1 % — ABNORMAL LOW (ref 36.0–46.0)
Hemoglobin: 8.8 g/dL — ABNORMAL LOW (ref 12.0–15.0)
Hemoglobin: 10 g/dL — ABNORMAL LOW (ref 12.0–15.0)
MCH: 28.9 pg (ref 26.0–34.0)
MCH: 29 pg (ref 26.0–34.0)
MCHC: 33 g/dL (ref 30.0–36.0)
MCHC: 33.2 g/dL (ref 30.0–36.0)
MCV: 87.8 fL (ref 80.0–100.0)
MCV: 87.2 fL (ref 80.0–100.0)
Platelets: 123 10*3/uL — ABNORMAL LOW (ref 150–400)
Platelets: 142 10*3/uL — ABNORMAL LOW (ref 150–400)
RBC: 3.04 MIL/uL — ABNORMAL LOW (ref 3.87–5.11)
RBC: 3.45 MIL/uL — ABNORMAL LOW (ref 3.87–5.11)
RDW: 14.1 % (ref 11.5–15.5)
RDW: 14.1 % (ref 11.5–15.5)
WBC: 11.1 10*3/uL — ABNORMAL HIGH (ref 4.0–10.5)
WBC: 13.9 10*3/uL — ABNORMAL HIGH (ref 4.0–10.5)
nRBC: 0 % (ref 0.0–0.2)
nRBC: 0.1 % (ref 0.0–0.2)

## 2023-08-15 LAB — GLUCOSE, CAPILLARY
Glucose-Capillary: 131 mg/dL — ABNORMAL HIGH (ref 70–99)
Glucose-Capillary: 150 mg/dL — ABNORMAL HIGH (ref 70–99)
Glucose-Capillary: 159 mg/dL — ABNORMAL HIGH (ref 70–99)
Glucose-Capillary: 167 mg/dL — ABNORMAL HIGH (ref 70–99)
Glucose-Capillary: 170 mg/dL — ABNORMAL HIGH (ref 70–99)
Glucose-Capillary: 174 mg/dL — ABNORMAL HIGH (ref 70–99)
Glucose-Capillary: 180 mg/dL — ABNORMAL HIGH (ref 70–99)
Glucose-Capillary: 187 mg/dL — ABNORMAL HIGH (ref 70–99)
Glucose-Capillary: 189 mg/dL — ABNORMAL HIGH (ref 70–99)
Glucose-Capillary: 206 mg/dL — ABNORMAL HIGH (ref 70–99)
Glucose-Capillary: 223 mg/dL — ABNORMAL HIGH (ref 70–99)
Glucose-Capillary: 253 mg/dL — ABNORMAL HIGH (ref 70–99)
Glucose-Capillary: 277 mg/dL — ABNORMAL HIGH (ref 70–99)

## 2023-08-15 LAB — BASIC METABOLIC PANEL WITH GFR
Anion gap: 8 (ref 5–15)
Anion gap: 14 (ref 5–15)
BUN: 8 mg/dL (ref 8–23)
BUN: 8 mg/dL (ref 8–23)
CO2: 22 mmol/L (ref 22–32)
CO2: 19 mmol/L — ABNORMAL LOW (ref 22–32)
Calcium: 8.7 mg/dL — ABNORMAL LOW (ref 8.9–10.3)
Calcium: 9 mg/dL (ref 8.9–10.3)
Chloride: 102 mmol/L (ref 98–111)
Chloride: 100 mmol/L (ref 98–111)
Creatinine, Ser: 0.84 mg/dL (ref 0.44–1.00)
Creatinine, Ser: 0.78 mg/dL (ref 0.44–1.00)
GFR, Estimated: >60 mL/min (ref >60)
GFR, Estimated: >60 mL/min (ref >60)
Glucose, Bld: 189 mg/dL — ABNORMAL HIGH (ref 70–99)
Glucose, Bld: 236 mg/dL — ABNORMAL HIGH (ref 70–99)
Potassium: 3.9 mmol/L (ref 3.5–5.1)
Potassium: 4.2 mmol/L (ref 3.5–5.1)
Sodium: 132 mmol/L — ABNORMAL LOW (ref 135–145)
Sodium: 133 mmol/L — ABNORMAL LOW (ref 135–145)

## 2023-08-15 LAB — MAGNESIUM: Magnesium: 1.6 mg/dL — ABNORMAL LOW (ref 1.7–2.4)

## 2023-08-15 LAB — PROTIME-INR
INR: 1.3 — ABNORMAL HIGH (ref 0.8–1.2)
Prothrombin Time: 16.6 s — ABNORMAL HIGH (ref 11.4–15.2)

## 2023-08-15 LAB — HEPARIN LEVEL (UNFRACTIONATED): Heparin Unfractionated: <0.1 [IU]/mL — ABNORMAL LOW (ref 0.30–0.70)

## 2023-08-15 LAB — PHOSPHORUS: Phosphorus: 3 mg/dL (ref 2.5–4.6)

## 2023-08-15 MED ORDER — INSULIN ASPART 100 UNIT/ML IJ SOLN: 0.0000 [IU] | INTRAMUSCULAR | Status: DC

## 2023-08-15 MED ORDER — JEVITY 1.5 CAL/FIBER PO LIQD
1000.0000 mL | ORAL | Status: DC
  Administered 2023-08-15 – 2023-08-17 (×3): 1000 mL
  Filled 2023-08-15 (×4): qty 1000

## 2023-08-15 MED ORDER — SPIRONOLACTONE 12.5 MG HALF TABLET
12.5000 mg | ORAL_TABLET | Freq: Every day | ORAL | Status: DC
  Filled 2023-08-15: qty 1

## 2023-08-15 MED ORDER — WARFARIN SODIUM 2.5 MG PO TABS
2.5000 mg | ORAL_TABLET | Freq: Once | ORAL | Status: AC
  Administered 2023-08-15: 2.5 mg via ORAL
  Filled 2023-08-15: qty 1

## 2023-08-15 MED ORDER — INSULIN GLARGINE-YFGN 100 UNIT/ML ~~LOC~~ SOLN
7.0000 [IU] | Freq: Every day | SUBCUTANEOUS | Status: DC
  Filled 2023-08-15: qty 0.07

## 2023-08-15 MED ORDER — HYDRALAZINE HCL 20 MG/ML IJ SOLN
20.0000 mg | INTRAMUSCULAR | Status: DC | PRN
  Administered 2023-08-15: 20 mg via INTRAVENOUS
  Filled 2023-08-15: qty 1

## 2023-08-15 MED ORDER — PANTOPRAZOLE SODIUM 40 MG IV SOLR
40.0000 mg | Freq: Every day | INTRAVENOUS | Status: DC
  Administered 2023-08-15 – 2023-08-24 (×10): 40 mg via INTRAVENOUS
  Filled 2023-08-15 (×10): qty 10

## 2023-08-15 MED ORDER — MAGNESIUM SULFATE 2 GM/50ML IV SOLN
2.0000 g | Freq: Once | INTRAVENOUS | Status: AC
  Administered 2023-08-15: 2 g via INTRAVENOUS
  Filled 2023-08-15: qty 50

## 2023-08-15 MED ORDER — INSULIN ASPART 100 UNIT/ML IJ SOLN
0.0000 [IU] | INTRAMUSCULAR | Status: DC
  Administered 2023-08-15: 12 [IU] via SUBCUTANEOUS
  Administered 2023-08-15: 4 [IU] via SUBCUTANEOUS
  Administered 2023-08-15: 12 [IU] via SUBCUTANEOUS
  Administered 2023-08-15: 8 [IU] via SUBCUTANEOUS
  Administered 2023-08-16 (×2): 12 [IU] via SUBCUTANEOUS
  Administered 2023-08-16: 20 [IU] via SUBCUTANEOUS
  Administered 2023-08-16 (×2): 12 [IU] via SUBCUTANEOUS
  Administered 2023-08-16 – 2023-08-17 (×2): 16 [IU] via SUBCUTANEOUS
  Administered 2023-08-17 – 2023-08-18 (×6): 8 [IU] via SUBCUTANEOUS
  Administered 2023-08-18: 16 [IU] via SUBCUTANEOUS
  Administered 2023-08-18: 8 [IU] via SUBCUTANEOUS
  Administered 2023-08-18: 4 [IU] via SUBCUTANEOUS
  Administered 2023-08-18 – 2023-08-19 (×2): 8 [IU] via SUBCUTANEOUS
  Administered 2023-08-19 (×2): 4 [IU] via SUBCUTANEOUS
  Administered 2023-08-19: 2 [IU] via SUBCUTANEOUS
  Administered 2023-08-19 – 2023-08-20 (×2): 12 [IU] via SUBCUTANEOUS
  Administered 2023-08-20: 2 [IU] via SUBCUTANEOUS
  Administered 2023-08-20: 4 [IU] via SUBCUTANEOUS
  Administered 2023-08-20: 2 [IU] via SUBCUTANEOUS
  Administered 2023-08-21: 20 [IU] via SUBCUTANEOUS
  Administered 2023-08-21: 8 [IU] via SUBCUTANEOUS
  Administered 2023-08-21: 3 [IU] via SUBCUTANEOUS
  Administered 2023-08-21: 2 [IU] via SUBCUTANEOUS
  Administered 2023-08-22: 4 [IU] via SUBCUTANEOUS
  Administered 2023-08-22: 12 [IU] via SUBCUTANEOUS
  Administered 2023-08-22: 4 [IU] via SUBCUTANEOUS
  Administered 2023-08-23: 2 [IU] via SUBCUTANEOUS
  Administered 2023-08-23: 8 [IU] via SUBCUTANEOUS
  Administered 2023-08-23: 4 [IU] via SUBCUTANEOUS
  Administered 2023-08-24: 8 [IU] via SUBCUTANEOUS
  Administered 2023-08-24: 2 [IU] via SUBCUTANEOUS
  Administered 2023-08-25: 4 [IU] via SUBCUTANEOUS
  Administered 2023-08-25: 2 [IU] via SUBCUTANEOUS
  Administered 2023-08-25: 4 [IU] via SUBCUTANEOUS

## 2023-08-15 MED ORDER — PROSOURCE TF20 ENFIT COMPATIBL EN LIQD
60.0000 mL | Freq: Three times a day (TID) | ENTERAL | Status: DC
  Administered 2023-08-15 – 2023-08-18 (×9): 60 mL
  Filled 2023-08-15 (×9): qty 60

## 2023-08-15 MED ORDER — CLONAZEPAM 1 MG PO TABS
1.0000 mg | ORAL_TABLET | Freq: Two times a day (BID) | ORAL | Status: DC | PRN
  Administered 2023-08-15 – 2023-08-17 (×5): 1 mg
  Filled 2023-08-15 (×5): qty 1

## 2023-08-15 MED ORDER — FUROSEMIDE 10 MG/ML IJ SOLN: 40.0000 mg | Freq: Once | INTRAMUSCULAR | Status: DC

## 2023-08-15 MED ORDER — ENOXAPARIN SODIUM 40 MG/0.4ML IJ SOSY
40.0000 mg | PREFILLED_SYRINGE | Freq: Every day | INTRAMUSCULAR | Status: DC
  Administered 2023-08-15 – 2023-08-17 (×3): 40 mg via SUBCUTANEOUS
  Filled 2023-08-15 (×3): qty 0.4

## 2023-08-15 MED ORDER — INSULIN GLARGINE-YFGN 100 UNIT/ML ~~LOC~~ SOLN
7.0000 [IU] | Freq: Once | SUBCUTANEOUS | Status: AC
  Administered 2023-08-15: 7 [IU] via SUBCUTANEOUS
  Filled 2023-08-15: qty 0.07

## 2023-08-15 MED ORDER — POTASSIUM CHLORIDE 10 MEQ/50ML IV SOLN
10.0000 meq | INTRAVENOUS | Status: AC
  Administered 2023-08-15 (×2): 10 meq via INTRAVENOUS
  Filled 2023-08-15 (×2): qty 50

## 2023-08-15 MED ORDER — HYDRALAZINE HCL 25 MG PO TABS
25.0000 mg | ORAL_TABLET | Freq: Three times a day (TID) | ORAL | Status: DC
  Administered 2023-08-15 – 2023-08-16 (×3): 25 mg via ORAL
  Filled 2023-08-15 (×3): qty 1

## 2023-08-15 MED ORDER — INSULIN GLARGINE-YFGN 100 UNIT/ML ~~LOC~~ SOLN
7.0000 [IU] | Freq: Once | SUBCUTANEOUS | Status: DC
  Filled 2023-08-15: qty 0.07

## 2023-08-15 MED ORDER — WARFARIN - PHARMACIST DOSING INPATIENT: Freq: Every day | Status: DC

## 2023-08-15 MED FILL — Dexmedetomidine HCl in NaCl 0.9% IV Soln 400 MCG/100ML: INTRAVENOUS | Qty: 100 | Status: AC

## 2023-08-15 MED FILL — Norepinephrine-Dextrose IV Solution 16 MG/250ML-5%: INTRAVENOUS | Qty: 250 | Status: AC

## 2023-08-15 NOTE — Progress Notes (Signed)
 [facility]    Date of Admission:  07/31/2023   Total days of antibiotics day 2 since valve surgery   ID: Melissa Bray is a 63 y.o. female with group b strep endocarditis s/p mvr on 5/14 Principal Problem:   Mitral valve endocarditis Active Problems:   Essential hypertension   DM2 (diabetes mellitus, type 2) (HCC)   HLD (hyperlipidemia)   Severe sepsis (HCC)   Cellulitis of left upper extremity   Obesity, Class III, BMI 40-49.9 (morbid obesity)   Perforation of leaflet of mitral valve   GAD (generalized anxiety disorder)   History of breast cancer   Hypomagnesemia   Hypokalemia   Hyponatremia   Sinus tachycardia   Cardiogenic shock (HCC)   Shock (HCC)   S/P MVR (mitral valve replacement)    Subjective: Balloon pump removed yesterday still requiring milrinone/swan-ganz catheter in place. Didn't pass swallow eval getting cortrak. Fever curve coming down.  Medications:   [START ON 08/16/2023] acetaminophen   1,000 mg Oral Q6H   Or   [START ON 08/16/2023] acetaminophen  (TYLENOL ) oral liquid 160 mg/5 mL  1,000 mg Per Tube Q6H   anastrozole   1 mg Oral Daily   aspirin  EC  81 mg Oral Daily   bisacodyl  10 mg Oral Daily   Or   bisacodyl  10 mg Rectal Daily   Chlorhexidine  Gluconate Cloth  6 each Topical Daily   docusate sodium  200 mg Oral Daily   enoxaparin (LOVENOX) injection  40 mg Subcutaneous QHS   feeding supplement (PROSource TF20)  60 mL Per Tube TID   hydrALAZINE  25 mg Oral Q8H   insulin  aspart  0-24 Units Subcutaneous Q4H   [START ON 08/16/2023] insulin  glargine-yfgn  7 Units Subcutaneous Daily   pantoprazole (PROTONIX) IV  40 mg Intravenous Daily   sodium chloride  flush  3 mL Intravenous Q12H   sodium chloride  flush  3-10 mL Intravenous Q12H   spironolactone  12.5 mg Oral Daily   Warfarin - Pharmacist Dosing Inpatient   Does not apply q1600    Objective: Vital signs in last 24 hours: Temp:  [98.8 F (37.1 C)-100.4 F (38 C)] 100  F (37.8 C) (05/16 1845) Pulse Rate:  [68-94] 84 (05/16 1845) Resp:  [0-38] 24 (05/16 1845) SpO2:  [88 %-98 %] 97 % (05/16 1845) Arterial Line BP: (86-193)/(36-69) 121/50 (05/16 1845)  Physical Exam  Constitutional:  oriented to person, place, and time. appears well-developed and well-nourished. No distress.  HENT: [place]/AT, PERRLA, no scleral icterus Mouth/Throat: Oropharynx is clear and moist. No oropharyngeal exudate.  Cardiovascular: Normal rate, regular rhythm and normal heart sounds. Exam reveals no gallop and no friction rub.  No murmur heard.  Chest wall = mediastinal chest tube in place Pulmonary/Chest: Effort normal and breath sounds normal. No respiratory distress.  has no wheezes.  Neck = supple, no nuchal rigidity. Catheter c/d/I covered Abdominal: Soft. Bowel sounds are normal.  exhibits no distension. There is no tenderness.  Lymphadenopathy: no cervical adenopathy. No axillary adenopathy Neurological: alert and oriented to person, place, and time.  Skin: Skin is warm and dry. No rash noted. No erythema.  Psychiatric: a normal mood and affect.  behavior is normal.    Lab Results Recent Labs    08/15/23 0423 08/15/23 1517  WBC 11.1* 13.9*  HGB 8.8* 10.0*  HCT 26.7* 30.1*  NA 132* 133*  K 3.9 4.2  CL 102 100  CO2 22 19*  BUN 8 8  CREATININE 0.84 0.78   Liver Panel No results for input(s): "PROT", "ALBUMIN ", "AST", "ALT", "ALKPHOS", "BILITOT", "BILIDIR", "IBILI" in the last 72 hours. Sedimentation Rate No results for input(s): "ESRSEDRATE" in the last 72 hours. C-Reactive Protein No results for input(s): "CRP" in the last 72 hours.  Microbiology: 5/14 tissue = ngtd 5/10 blood cx = ngtd 5/1 blod cx = group b strep Studies/Results: DG Chest Port 1 View Result Date: 08/15/2023 CLINICAL DATA:  Status post mitral bowel replacement. EXAM: PORTABLE CHEST 1 VIEW COMPARISON:  Chest radiograph dated 08/14/2023. FINDINGS: Interval removal of the endotracheal and  enteric tubes. Additional support apparatus in similar position. Diffuse density in the right lung and left lung base, new or increased since the prior radiograph and may represent combination of pleural effusion and edema. No pneumothorax. Stable cardiomediastinal silhouette. No acute osseous pathology. IMPRESSION: 1. Interval removal of the endotracheal and enteric tubes. 2. Interval development of vascular congestion and pleural effusions, right greater than left. Electronically Signed   By: Angus Bark M.D.   On: 08/15/2023 09:23   DG Chest Port 1 View Result Date: 08/14/2023 CLINICAL DATA:  [id number].  Mitral valve replacement surgery. EXAM: PORTABLE CHEST 1 VIEW COMPARISON:  Portable chest yesterday at 6:19 p.m. FINDINGS: 5:57 a.m. ETT tip is 2.9 cm from the carina. NGT curves left in the stomach with the tip proximal fundal area. Right IJ Swan-Ganz catheter again terminates to the right probably in the main lower lobe artery. Left IJ line tip is at the superior cavoatrial junction. There are stable postsurgical changes with AVR, left atrial appendage clip, sternotomy sutures. There is a right chest tube and at least 1 mediastinal drain to the right. Stable cardiomediastinal silhouette with superior widening, mild cardiomegaly. Normal caliber central vessels. Stable appearance of low lung volumes and bibasilar atelectasis. Remaining lungs are clear. Overall aeration seems unchanged.  No new abnormality. IMPRESSION: 1. Stable appearance of the chest with low lung volumes and bibasilar atelectasis. No new abnormality. 2. Stable support apparatus. 3. Stable cardiomediastinal silhouette. Electronically Signed   By: Denman Fischer M.D.   On: 08/14/2023 08:02     Assessment/Plan: Group b strep mitral valve endocarditis s/p MVR on 5/14 = restarted antibiotic clock on 5/14. Plan for 6 wk with continuous infusion of penicillin  via picc line  We will arrange for follow up. Will sign off for now. Please  contact on transfer to acute rehab.  [facility] Pager: [phone]  08/15/2023, 7:28 PM

## 2023-08-15 NOTE — Progress Notes (Signed)
 NAME:  Melissa Bray, MRN:  [id number], DOB:  01/03/1961, LOS: 13 ADMISSION DATE:  07/31/2023, CONSULTATION DATE:  5/9 REFERRING MD:  Mitzie Anda, CHIEF COMPLAINT:  respiratory failure   History of Present Illness:  Melissa Bray is a 63 y/o woman with a history of DM, HTN who presented on 5/1 with cellulitis of her LUE and sepsis. At presentation she complained of fevers, generalized weakness, and reduced PO intake. During her evaluation she was diagnosed with GBS bacteremia and mitral valve endocarditis with leaflet perforation. She required dental extractions for dental carries. Plans have been in place for mitral valve repair by TCTS. She was planned for [facility] today; after coming to the cath lab and going through timeout procedure she was moved to the cath lab table. She very quickly got distressed lying back, trying to sit up. She was placed on bipap for respiratory distress. She suddenly went unresponsive during this time without ongoing agitation. No sedation was given. PCCM was consulted in cath lab for emergent management of respiratory failure.    Pertinent  Medical History  MD HTN HLD Breast cancer, s/p left mastectomy  Significant Hospital Events: Including procedures, antibiotic start and stop dates in addition to other pertinent events   5/1 Admitted  5/9 deteriorated due to flash pulmonary edema, intubated, IABP. 5/12 Extubated  5/13 Remains off vent, overnight needing levophed gtt restarted but weaning off  5/14 plan for mitral valve replacement, still on IABP 1:1  5/15 mitral valve replacement, remains on vent post op. Extubated, IABP removed.  Interim History / Subjective:  Doing well after extubation. On Milrinone. Slightly hypertensive.  Objective    Physical Exam  General: Acute on chronic older adult female lying in ICU bed, in no distress HEENT: [place]/AT, EOMI. MMM CV: RRR, mechanical click noted. Chest tube in place. pulm: Clear, diminished, no distress Abd: BS hypoactive,  soft Extremities: No deformities, mild edema Skin: No rash Neuro: Sleepy but arouses to voice and is alert/apprpriate, follows all commands GU: Foley  Resolved Hospital Problem list :  Acute encephalopathy due to hypercapnia Hyperkalemia, resolved> wonder if this was due to succinylcholine at intubation Hypomagnesemia  Assessment & Plan:   Acute MR due to MV endocarditis in setting of Strep group A, agalactia bacteremia - S/p mitral valve replacement 5/14. Acute HFpEF due to severe MR leading to cardiogenic shock. P: ID following appreciate their assistance and recommendations Continue Pen G Coumadin per pharmacy to start today 5/16 Continue milrinone, amiodarone  ? Antihypertensive - AHF to decide after discussions with TCTS ASA  Acute respiratory failure requiring mechanical ventilation - s/p extubation 5/15.  P:  Bronchial hygiene Mobilize when able  Hypervolemic hyponatremia  - improved.  P: Gentle diuresis, additional 40mg  Lasix today Strict I/Os F/u chem   Diabetes type 2, poorly controlled with hyperglycemia. CBGs within goal Hemoglobin A1c 12.8 P: Transitioning off insulin  gtt  Regurgitation status post swallowing evaluation During speech therapy swallow examination Patient had significant regurgitation of the contents that were given during the bedside evaluation Recommending patient undergo esophagram once medically stable, n.p.o. except for meds at this time P: SLP to see again today and then decide on diet vs NPO and cortrak Consider eventual esophagram once medically appropriate  Constipation Stool output documented on 5/13 P: Continue current bowel regimen  Anemia of critical illness P: Hemoglobin stable, transfuse for hemoglobin less than 7 Continue to monitor CBCs daily   Best Practice (right click and "Reselect all SmartList Selections" daily)   Diet/type:  NPO  DVT prophylaxis Coumadin to start 5/16 Pressure ulcer(s): N/A GI  prophylaxis: PPI Lines: Central line, Aline Foley:  Yes, and it is still needed Code Status:  full code Last date of multidisciplinary goals of care discussion- 5/13   CC time: 30 min.   Rafael Bun, PA - C  Pulmonary & Critical Care Medicine For pager details, please see AMION or use Epic chat  After 1900, please call [facility] for cross coverage needs 08/15/2023, 9:53 AM

## 2023-08-15 NOTE — Progress Notes (Addendum)
 Patient ID: Melissa Bray, female   DOB: Mar 03, 1961, 63 y.o.   MRN: [id number]     Advanced Heart Failure Rounding Note  Cardiologist: Eilleen Grates, MD  Chief Complaint: CHF Subjective:    POD#2 MVR  No co-ox this morning. CI 2.34. IABP removed yesterday. Off Epi. On milrinone 0.25.   Swan #s:  PAP: (18-35)/(8-26) 21/15 CVP:  [0 mmHg-39 mmHg] 6 mmHg CO:  [4 L/min-5.3 L/min] 5.2 L/min CI:  [1.82 L/min/m2-2.41 L/min/m2] 2.3 L/min/m2 PCWP 11 PAPi 1  Now extubated. Drowsy but responsive.   Objective:   Bedside echo 5/15 with RV mod dysfunction   Weight Range: 122.7 kg Body mass index is 45.01 kg/m.   Vital Signs:   Temp:  [98.8 F (37.1 C)-101.1 F (38.4 C)] 99.7 F (37.6 C) (05/16 0930) Pulse Rate:  [68-131] 69 (05/16 0930) Resp:  [0-43] 0 (05/16 0930) SpO2:  [88 %-100 %] 97 % (05/16 0930) Arterial Line BP: (86-218)/(36-90) 150/52 (05/16 0930) FiO2 (%):  [40 %] 40 % (05/15 1056) Last BM Date : 08/12/23  Weight change: Filed Weights   08/11/23 0500 08/13/23 0500 08/14/23 0500  Weight: 119 kg 115.5 kg 122.7 kg   Intake/Output:  Intake/Output Summary (Last 24 hours) at 08/15/2023 0945 Last data filed at 08/15/2023 0900 Gross per 24 hour  Intake 3048.16 ml  Output 2307 ml  Net 741.16 ml    Physical Exam   CVP 6/7 General:  elderly appearing.  No respiratory difficulty Neck: supple. JVD difficult to see. LIJ CVC. Swann RIJ Cor: PMI nondisplaced. Regular rate & rhythm. No rubs, gallops or murmurs. CT. Wound vac.  Lungs: clear Extremities: no cyanosis, clubbing, rash, edema. R radial aline GU: + foley Neuro: alert & oriented x 3. Moves all 4 extremities w/o difficulty. Affect pleasant.   Telemetry   NSR 90s (Personally reviewed)    Labs    CBC Recent Labs    08/13/23 1749 08/14/23 0400 08/14/23 1557 08/14/23 1700 08/15/23 0423  WBC 9.8   < > 10.3  --  11.1*  NEUTROABS 7.4  --   --   --   --   HGB 10.2*  9.6*   < > 9.3* 8.2* 8.8*  HCT 30.0*   28.4*   < > 28.5* 24.0* 26.7*  MCV 86.9   < > 89.1  --  87.8  PLT 105*   < > 121*  --  123*   < > = values in this interval not displayed.   Basic Metabolic Panel Recent Labs    40/98/11 1751 08/13/23 0500 08/13/23 1231 08/14/23 0400 08/14/23 0404 08/14/23 1557 08/14/23 1700 08/15/23 0423  NA 135 134*   < > 134*   < > 136 134* 132*  K 4.1 3.8   < > 5.0   < > 4.1 4.4 3.9  CL 95* 95*   < > 102  --  101  --  102  CO2 30 30  --  23  --  25  --  22  GLUCOSE 134* 135*   < > 254*  --  202*  --  189*  BUN 9 8   < > 10  --  9  --  8  CREATININE 0.94 0.94   < > 0.95  --  0.95  --  0.84  CALCIUM 8.9 9.1  --  9.0  --  9.1  --  8.7*  MG 2.1 2.0  --  2.5*  --  1.9  --   --  PHOS 3.1 3.8  --   --   --   --   --   --    < > = values in this interval not displayed.   Imaging   DG Chest Port 1 View Result Date: 08/15/2023 CLINICAL DATA:  Status post mitral bowel replacement. EXAM: PORTABLE CHEST 1 VIEW COMPARISON:  Chest radiograph dated 08/14/2023. FINDINGS: Interval removal of the endotracheal and enteric tubes. Additional support apparatus in similar position. Diffuse density in the right lung and left lung base, new or increased since the prior radiograph and may represent combination of pleural effusion and edema. No pneumothorax. Stable cardiomediastinal silhouette. No acute osseous pathology. IMPRESSION: 1. Interval removal of the endotracheal and enteric tubes. 2. Interval development of vascular congestion and pleural effusions, right greater than left. Electronically Signed   By: Angus Bark M.D.   On: 08/15/2023 09:23    Medications:    Scheduled Medications:  [START ON 08/16/2023] acetaminophen   1,000 mg Oral Q6H   Or   [START ON 08/16/2023] acetaminophen  (TYLENOL ) oral liquid 160 mg/5 mL  1,000 mg Per Tube Q6H   anastrozole   1 mg Oral Daily   aspirin  EC  81 mg Oral Daily   bisacodyl  10 mg Oral Daily   Or   bisacodyl  10 mg Rectal Daily   Chlorhexidine  Gluconate Cloth  6  each Topical Daily   docusate sodium  200 mg Oral Daily   enoxaparin (LOVENOX) injection  40 mg Subcutaneous QHS   insulin  aspart  0-24 Units Subcutaneous Q4H   [START ON 08/16/2023] insulin  glargine-yfgn  7 Units Subcutaneous Daily   pantoprazole  40 mg Oral Daily   sodium chloride  flush  3 mL Intravenous Q12H   sodium chloride  flush  3-10 mL Intravenous Q12H   Infusions:  acetaminophen  Stopped (08/15/23 0709)   albumin  human Stopped (08/14/23 0952)   amiodarone 30 mg/hr (08/15/23 0900)   dexmedetomidine (PRECEDEX) IV infusion Stopped (08/15/23 0714)   epinephrine  Stopped (08/15/23 9604)   insulin  1.6 Units/hr (08/15/23 0900)   milrinone 0.25 mcg/kg/min (08/15/23 0900)   niCARDipine     norepinephrine (LEVOPHED) Adult infusion Stopped (08/14/23 0801)   penicillin  G potassium 12 Million Units in dextrose  5 % 500 mL CONTINUOUS infusion 41.7 mL/hr at 08/15/23 0900   phenylephrine  (NEO-SYNEPHRINE) Adult infusion     PRN Medications: albumin  human, dextrose , metoprolol  tartrate, midazolam , morphine injection, ondansetron  (ZOFRAN ) IV, mouth rinse, oxyCODONE , sodium chloride  flush, sodium chloride  flush, traMADol   Assessment/Plan   1. Cardiogenic shock: Valvular shock from severe MR.  TEE on 5/5 showed LV EF 65%, normal RV function, severe MR with perforated leaflet and mobile vegetation.  Patient developed flash pulmonary edema 5/9 in setting of marked hypertension.  Remains intubated.  - No co-ox this morning. Off Epi. Continue Milrinone 0.25 mcg/kg/min. IABP removed yesterday.  - POCUS by Dr. Julane Ny with mod reduced RV and ok LV 08/14/23 - Euvolemic today. Hold further diuretics today.   2. Mitral regurgitation: Severe on TEE with perforated anterior leaflet and mitral valve vegetation.  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  - s/p MVR with Dr. Luna Salinas - post-op antibiotics per TCTS - Starting coumadin today per TCTS  3. Acute hypercarbic/hypoxemic  respiratory failure. Increasing pulm edema and atelectasis on CXR.  - Continue diuresis as above.  - intubated. Vent management per TCTS. CT in place.   4. Hypertension - Stable off pressors. Now SBP 180s-low 200s. - Continue milrinone for CO/CI support - Start hydralazine.  Will give IV x2 followed by PO.  - If it does not improve will start PO Hydralazine 25 mg TID - Start spiro 12.5 mg daily  CRITICAL CARE Performed by: Sheryl Donna  Total critical care time: 11 minutes  Critical care time was exclusive of separately billable procedures and treating other patients.  Critical care was necessary to treat or prevent imminent or life-threatening deterioration.  Critical care was time spent personally by me on the following activities: development of treatment plan with patient and/or surrogate as well as nursing, discussions with consultants, evaluation of patient's response to treatment, examination of patient, obtaining history from patient or surrogate, ordering and performing treatments and interventions, ordering and review of laboratory studies, ordering and review of radiographic studies, pulse oximetry and re-evaluation of patient's condition.  Length of Stay: 13  Sheryl Donna, NP  08/15/2023, 9:45 AM  Advanced Heart Failure Team Pager [phone] (M-F; 7a - 5p)  Please contact [facility] for night-coverage after hours (5p -7a ) and weekends on [email or web address]  Agree with above   Epi stopped this am due to HTN with SBP > 200. Remains on milrinone. Hemodynamics done personally and c/w RV failure  Volume status elevated. Still with some mild thin drainage from CTs.  Rhythm sinus  General:  Lying flat in bed No resp difficulty HEENT: normal Neck: supple. RIJ swan Cor: sternal dressing ok + CT. Reg mech s1  Lungs: clear Abdomen: obese soft, nontender, nondistended. No hepatosplenomegaly. No bruits or masses. Good bowel sounds. Extremities: no cyanosis, clubbing, rash,1+  edema Neuro: alert & orientedx3, cranial nerves grossly intact. moves all 4 extremities w/o difficulty. Affect pleasant  Continue milrinone for RV support. Diurese with IV lasix. Start hydral and spiro for HTN. Can use IV NTG as needed.   Mobilize. Start warfarin per TCTS  CRITICAL CARE Performed by: Shanique Aslinger  Total critical care time: 45 minutes  Critical care time was exclusive of separately billable procedures and treating other patients.  Critical care was necessary to treat or prevent imminent or life-threatening deterioration.  Critical care was time spent personally by me (independent of midlevel providers or residents) on the following activities: development of treatment plan with patient and/or surrogate as well as nursing, discussions with consultants, evaluation of patient's response to treatment, examination of patient, obtaining history from patient or surrogate, ordering and performing treatments and interventions, ordering and review of laboratory studies, ordering and review of radiographic studies, pulse oximetry and re-evaluation of patient's condition.  Jules Oar, MD  7:14 PM

## 2023-08-15 NOTE — Progress Notes (Signed)
 Speech Language Pathology Treatment: Dysphagia  Patient Details Name: Melissa Bray MRN: [id number] DOB: 1960/08/29 Today's Date: 08/15/2023 Time: 4098-1191 SLP Time Calculation (min) (ACUTE ONLY): 8 min  Assessment / Plan / Recommendation Clinical Impression  Pt has undergone Mitral valve replacement since prior SLP assessment. She is awake, but anxious, has swan-ganz line. Is not appropriate for previously recommended esophagram at this time. Pt demonstrates no new acute impairments in swallowing, but she was intubated again for 2 days. She is weaker, has no interest in PO. Had to be encouraged to take and ice and only a few sips of water. Sips were very small. Says she is too nervous to take more. Recommend sips of clears as desired, but also recommend alternate means of nutrition given high needs, poor appetite and potential for pharyngeal/esophageal dysphagia. Will f/u for advancement.   HPI HPI: Melissa Bray is a 63 yo female presenting to ED 5/1 with fevers, generalized weakness, and reduced PO intake. Found to have LUE cellulitis and sepsis in addition to GBS bacteremia and mitral valve endocarditis with leaflet perforation. Developed respiratory distress and period of unresponsiveness during cath lab procedure 5/9, requiring intubation. ETT 5/9-5/12. Seen by SLP on 5/13, regurgitated immediately after POs. SLP recommended esopahgram. Pt intubated 5/14 for mitral valve surgery, extubated 5/15. SLP asked to reassess. PMH includes DM, HTN, HLD, breast cancer s/p L mastectomy      SLP Plan  Continue with current plan of care      Recommendations for follow up therapy are one component of a multi-disciplinary discharge planning process, led by the attending physician.  Recommendations may be updated based on patient status, additional functional criteria and insurance authorization.    Recommendations  Diet recommendations: Thin liquid Liquids provided via: Cup;Straw Medication  Administration: Via alternative means Supervision: Full supervision/cueing for compensatory strategies Compensations: Slow rate;Small sips/bites Postural Changes and/or Swallow Maneuvers: Seated upright 90 degrees;Upright 30-60 min after meal                  Oral care BID           Continue with current plan of care     Pernell Dikes, Hardin Leys  08/15/2023, 10:43 AM

## 2023-08-15 NOTE — Progress Notes (Signed)
 PHARMACY - ANTICOAGULATION CONSULT NOTE  Pharmacy Consult for warfarin Indication: onX mMVR  No Known Allergies  Patient Measurements: Height: 5\' 5"  (165.1 cm) Weight: 122.7 kg (270 lb 8.1 oz) IBW/kg (Calculated) : 57 HEPARIN DW (KG): 84.3  Vital Signs: Temp: 99.7 F (37.6 C) (05/16 0930) Pulse Rate: 69 (05/16 0930)  Labs: Recent Labs    08/13/23 0500 08/13/23 1231 08/13/23 1802 08/14/23 0400 08/14/23 0404 08/14/23 0635 08/14/23 1536 08/14/23 1557 08/14/23 1700 08/15/23 0423  HGB 9.9*   < >  --  9.4*   < >  --    < > 9.3* 8.2* 8.8*  HCT 31.5*   < >  --  29.0*   < >  --    < > 28.5* 24.0* 26.7*  PLT 191   < >  --  139*  --   --   --  121*  --  123*  APTT  --   --  51*  --   --  25  --   --   --   --   LABPROT  --   --  16.2*  --   --   --   --   --   --  16.6*  INR  --   --  1.3*  --   --   --   --   --   --  1.3*  HEPARINUNFRC 0.35  --   --   --   --   --   --   --   --  <0.10*  CREATININE 0.94   < >  --  0.95  --   --   --  0.95  --  0.84   < > = values in this interval not displayed.    Estimated Creatinine Clearance: 89 mL/min (by C-G formula based on SCr of 0.84 mg/dL).   Medical History: Past Medical History:  Diagnosis Date   Anxiety    Breast cancer (HCC)    Diabetes mellitus without complication (HCC)    Hyperlipidemia    Hypertension    Type 2 diabetes mellitus without retinopathy (HCC) 06/17/2017    Medications:  Scheduled:   [START ON 08/16/2023] acetaminophen   1,000 mg Oral Q6H   Or   [START ON 08/16/2023] acetaminophen  (TYLENOL ) oral liquid 160 mg/5 mL  1,000 mg Per Tube Q6H   anastrozole   1 mg Oral Daily   aspirin  EC  81 mg Oral Daily   bisacodyl  10 mg Oral Daily   Or   bisacodyl  10 mg Rectal Daily   Chlorhexidine  Gluconate Cloth  6 each Topical Daily   docusate sodium  200 mg Oral Daily   enoxaparin (LOVENOX) injection  40 mg Subcutaneous QHS   insulin  aspart  0-24 Units Subcutaneous Q4H   [START ON 08/16/2023] insulin  glargine-yfgn   7 Units Subcutaneous Daily   pantoprazole (PROTONIX) IV  40 mg Intravenous Daily   sodium chloride  flush  3 mL Intravenous Q12H   sodium chloride  flush  3-10 mL Intravenous Q12H   spironolactone  12.5 mg Oral Daily    Assessment: 11 yof presenting with acute mitral valve endocarditis with perforated mitral leaflet and severe MR. Underwent dental extraction 5/8. Presented for pre-op RHC and developed flash pulmonary edema requiring intubation - IABP was placed in lab and IV heparin started. No AC PTA.  Pt now s/p cardiac surgery with onX mMVR placed. Pharmacy consulted to start warfarin 5/16 after IABP removal. INR today is  1.3, CBC stable. Will start warfarin slowly.  Goal of Therapy:  INR 2.5-3.5 Monitor platelets by anticoagulation protocol: Yes   Plan:  Warfarin 2.5mg  x1 tonight Daily INR   Levin Reamer, PharmD, BCPS, [facility] Clinical Pharmacist [phone] Please check AMION for all [facility] Pharmacy numbers 08/15/2023

## 2023-08-15 NOTE — Progress Notes (Signed)
 PCCM Brief Note  PM Rounds  Feels ok.  Didn't do great with SLP eval, not interested in much PO. SLP recommended small sips with thin liquids or now. Cortrak subsequently placed. TF's ordered.  Has had 425cc UOP since this AM. HD stable. Warfarin to start tonight.   Nothing further.   Rafael Bun, PA - C [facility] For pager details, please see AMION or use Epic chat  After 1900, please call [facility] for cross coverage needs 08/15/2023, 2:43 PM

## 2023-08-15 NOTE — Progress Notes (Signed)
 Patient ID: Melissa Bray, female   DOB: Aug 26, 1960, 63 y.o.   MRN: [id number]  TCTS Evening Rounds:  Hemodynamically stable in sinus rhythm. Milrinone 0.25.  CVP 7  UO good. Wt 16 lbs over preop this am.  CT output moderate 380 cc so far today but thin.

## 2023-08-15 NOTE — Progress Notes (Addendum)
 [address]       [place] [address]             [phone]      2 Days Post-Op Procedure(s) (LRB): MITRAL VALVE REPLACEMENT USING ON-X PROSTHETIC MITRAL HEART VALVE SIZE 31/33MM (N/A) ECHOCARDIOGRAM, TRANSESOPHAGEAL, INTRAOPERATIVE (N/A) CLIPPING, LEFT ATRIAL APPENDAGE USING ATRICURE LAA EXCLUSION SYSTEM SIZE 40  Subjective:  Patient doing okay.  Has some throat irritation.  Denies N/V.  Pain is okay.  No BM  Objective: Vital signs in last 24 hours: Temp:  [98.8 F (37.1 C)-101.1 F (38.4 C)] 99 F (37.2 C) (05/16 0345) Pulse Rate:  [82-131] 85 (05/16 0345) Cardiac Rhythm: Atrial paced (05/16 0400) Resp:  [7-43] 20 (05/16 0345) SpO2:  [88 %-100 %] 96 % (05/16 0345) Arterial Line BP: (86-218)/(36-90) 134/47 (05/16 0345) FiO2 (%):  [40 %] 40 % (05/15 1056)  Hemodynamic parameters for last 24 hours: PAP: (18-37)/(8-26) 28/22 CVP:  [2 mmHg-39 mmHg] 9 mmHg CO:  [3.5 L/min-5.3 L/min] 5.3 L/min CI:  [1.6 L/min/m2-2.41 L/min/m2] 2.41 L/min/m2  Intake/Output from previous day: 05/15 0701 - 05/16 0700 In: 3058.8 [I.V.:1542.8; IV Piggyback:1515.9] Out: 2162 [Urine:1652; Emesis/NG output:300; Chest Tube:210]  General appearance: alert, cooperative, and no distress Heart: regular rate and rhythm Lungs: diminished breath sounds bibasilar Abdomen: soft, non-tender; bowel sounds normal; no masses,  no organomegaly Extremities: edema bilaterally, non-pitting Wound: Pravena wound vac on sternotomy  Lab Results: Recent Labs    08/14/23 1557 08/14/23 1700 08/15/23 0423  WBC 10.3  --  11.1*  HGB 9.3* 8.2* 8.8*  HCT 28.5* 24.0* 26.7*  PLT 121*  --  123*   BMET:  Recent Labs    08/14/23 1557 08/14/23 1700 08/15/23 0423  NA 136 134* 132*  K 4.1 4.4 3.9  CL 101  --  102  CO2 25  --  22  GLUCOSE 202*  --  189*  BUN 9  --  8  CREATININE 0.95  --  0.84  CALCIUM 9.1  --  8.7*    PT/INR:  Recent Labs    08/15/23 0423  LABPROT 16.6*  INR 1.3*    ABG    Component Value Date/Time   PHART 7.465 (H) 08/14/2023 1700   HCO3 25.0 08/14/2023 1700   TCO2 26 08/14/2023 1700   ACIDBASEDEF 1.0 08/08/2023 1634   O2SAT 93 08/14/2023 1700   CBG (last 3)  Recent Labs    08/15/23 0308 08/15/23 0421 08/15/23 0655  GLUCAP 180* 206* 150*    Assessment/Plan: S/P Procedure(s) (LRB): MITRAL VALVE REPLACEMENT USING ON-X PROSTHETIC MITRAL HEART VALVE SIZE 31/33MM (N/A) ECHOCARDIOGRAM, TRANSESOPHAGEAL, INTRAOPERATIVE (N/A) CLIPPING, LEFT ATRIAL APPENDAGE USING ATRICURE LAA EXCLUSION SYSTEM SIZE 40  CV- NSR- remains on Amiodarone, Milrinone.. coumadin to start today, pharmacy consulted Pulm-extubated yesterday.. CT 210 cc yesterday.. CXR shows increase in atelectasis/edema on right... aggressive pulmonary toilet.. possibly d/c chest tube today Renal- creatinine is stable, weight is up about 10 lbs since admission.... would benefit from diuretics once appropriate Expected post operative blood loss anemia, not clinically significant at 8.8 ENT- patient failed swallow prior to surgery, have asked SLP to make new recs as she has been intubated/extubated again since previous eval.. if she remains NPO will need cortrak placement with TPN ID- slight increase in leukocytosis, likely inflammatory reaction.Melissa Bray known Endocarditis.Melissa Bray continue ABX DM-has remained on insulin  drip since surgery.. per endotool.. will start Semeglee at 7 units, SSIP Q4 Dispo- patient stable, start coumadin for mechanical mitral  valve, drip wean per AHF.. ? Start diuretics if appropriate, continue ABX, transition off insulin  drip   LOS: 13 days    Melissa Kasal, PA-C 08/15/2023  Agree HTN.  Will discuss decreasing milr vs adding antihypertensive with HF Will d/c foley Diuresing from chest tubes Continue ICU care  Melissa Bray Melissa Bray

## 2023-08-15 NOTE — Progress Notes (Addendum)
 Nutrition Follow-up  DOCUMENTATION CODES:   Obesity unspecified  INTERVENTION:   Begin TF via Cortrak: Jevity 1.5 at 25 ml/h, increase by 10 ml every 4 hours to goal rate of 45 ml/h. Prosource TF20 60 ml TID. Provides 1860 kcal, 129 gm protein, 821 ml free water daily.  Will likely need to add novolog  TF coverage.   NUTRITION DIAGNOSIS:   Inadequate oral intake related to acute illness as evidenced by NPO status.  Ongoing   GOAL:   Patient will meet greater than or equal to 90% of their needs  Progressing   MONITOR:   Vent status, Labs, Weight trends, TF tolerance  REASON FOR ASSESSMENT:   Consult Enteral/tube feeding initiation and management  ASSESSMENT:   63 yo female admitted with sepsis with cellulitis of LUE, diagnosed with GBS bacteremia and MV endocarditis with consult to TCTS, developed flash pulmonary edema and shock requiring intubation and IABP. PMH includes HTN, HLD, breast cancer s/p left mastectomy, DM, GERD  S/P mitral valve replacement 5/15.  IABP removed 5/15.  Currently NPO.    Patient's family member in room reports that patient was eating well at home, had a good appetite. They have a large family and they eat together a lot. Weight was stable PTA.  Since admission, patient has not had adequate nutrition. Plans for esophagram when patient is medically stable. S/P SLP re-evaluation today and SLP recommended sips of clear liquids + an alternate means of nutrition. Cortrak has been placed and tip is at the pylorus. Received MD Consult for TF initiation and management.  Labs reviewed. Na 132, K 3.9 CBG: [phone]  Medications reviewed and include dulcolax, colace, novolog , semglee , spironolactone, warfarin. Received IV KCl this morning for repletion. She is no longer receiving phenylephrine , levophed, epinephrine . Insulin  drip stopped this morning.   Admit weight (5/1): 120.7 Current weight (5/15): 122.7 kg Lowest weight since admission  114.9 kg on 5/11.   UOP 1652 ml x 24 h Emesis 300 ml x 24 h Chest tube output 350 ml x 24 h VAC to chest wound 0 ml output x 24 h  NUTRITION - FOCUSED PHYSICAL EXAM:  Flowsheet Row Most Recent Value  Orbital Region No depletion  Upper Arm Region No depletion  Thoracic and Lumbar Region No depletion  Buccal Region Mild depletion  Temple Region Mild depletion  Clavicle Bone Region No depletion  Clavicle and Acromion Bone Region No depletion  Scapular Bone Region No depletion  Dorsal Hand No depletion  Patellar Region No depletion  Anterior Thigh Region No depletion  Posterior Calf Region No depletion  Edema (RD Assessment) Mild  Hair Reviewed  Eyes Reviewed  Mouth Reviewed  Skin Reviewed  Nails Reviewed       Diet Order:   Diet Order     None       EDUCATION NEEDS:   Not appropriate for education at this time  Skin:  Skin Assessment: Skin Integrity Issues: Skin Integrity Issues:: Wound VAC Wound Vac: chest incision  Last BM:  5/13 type 7  Height:   Ht Readings from Last 1 Encounters:  08/10/23 5\' 5"  (1.651 m)    Weight:   Wt Readings from Last 1 Encounters:  08/14/23 122.7 kg    BMI:  Body mass index is 45.01 kg/m.  Estimated Nutritional Needs:   Kcal:  1800-2000  Protein:  120-140 gm  Fluid:  1.8-2 L   Barnet Boots RD, LDN, CNSC Contact via secure chat. If unavailable, use group chat "RD  Inpatient."

## 2023-08-15 NOTE — Procedures (Signed)
 Cortrak  Person Inserting Tube:  Louetta Roux, RD Tube Type:  Cortrak - 43 inches Tube Size:  10 Tube Location:  Left nare Initial Placement:  Stomach Secured by: Bridle Technique Used to Measure Tube Placement:  Marking at nare/corner of mouth Cortrak Secured At:  69 cm   Cortrak Tube Team Note:  Consult received to place a Cortrak feeding tube.   No x-ray is required. RN may begin using tube.   If the tube becomes dislodged please keep the tube and contact the Cortrak team at [email or web address] for replacement.  If after hours and replacement cannot be delayed, place a NG tube and confirm placement with an abdominal x-ray.    Ernestina Headland, MS, RD, LDN Registered Dietitian II Please see [email or web address] for contact information.

## 2023-08-16 ENCOUNTER — Inpatient Hospital Stay (HOSPITAL_COMMUNITY)

## 2023-08-16 DIAGNOSIS — J9602 Acute respiratory failure with hypercapnia: Secondary | ICD-10-CM | POA: Diagnosis not present

## 2023-08-16 DIAGNOSIS — J9601 Acute respiratory failure with hypoxia: Secondary | ICD-10-CM | POA: Diagnosis not present

## 2023-08-16 DIAGNOSIS — I059 Rheumatic mitral valve disease, unspecified: Secondary | ICD-10-CM | POA: Diagnosis not present

## 2023-08-16 DIAGNOSIS — I2699 Other pulmonary embolism without acute cor pulmonale: Secondary | ICD-10-CM | POA: Diagnosis not present

## 2023-08-16 LAB — CBC
HCT: 29.2 % — ABNORMAL LOW (ref 36.0–46.0)
Hemoglobin: 9.6 g/dL — ABNORMAL LOW (ref 12.0–15.0)
MCH: 28.7 pg (ref 26.0–34.0)
MCHC: 32.9 g/dL (ref 30.0–36.0)
MCV: 87.4 fL (ref 80.0–100.0)
Platelets: 153 10*3/uL (ref 150–400)
RBC: 3.34 MIL/uL — ABNORMAL LOW (ref 3.87–5.11)
RDW: 14.1 % (ref 11.5–15.5)
WBC: 11.3 10*3/uL — ABNORMAL HIGH (ref 4.0–10.5)
nRBC: 0 % (ref 0.0–0.2)

## 2023-08-16 LAB — COOXEMETRY PANEL
Carboxyhemoglobin: 1.4 % (ref 0.5–1.5)
Methemoglobin: 1 % (ref 0.0–1.5)
O2 Saturation: 58.2 %
Total hemoglobin: 9.9 g/dL — ABNORMAL LOW (ref 12.0–16.0)

## 2023-08-16 LAB — TYPE AND SCREEN
ABO/RH(D): O POS
Antibody Screen: NEGATIVE
Unit division: 0
Unit division: 0
Unit division: 0
Unit division: 0

## 2023-08-16 LAB — BPAM RBC
Blood Product Expiration Date: 202506042359
Blood Product Expiration Date: 202506042359
Blood Product Expiration Date: 202506102359
Blood Product Expiration Date: 202506102359
ISSUE DATE / TIME: 202505141302
ISSUE DATE / TIME: 202505141302
Unit Type and Rh: 5100
Unit Type and Rh: 5100
Unit Type and Rh: 5100
Unit Type and Rh: 5100

## 2023-08-16 LAB — BASIC METABOLIC PANEL WITH GFR
Anion gap: 9 (ref 5–15)
BUN: 16 mg/dL (ref 8–23)
CO2: 20 mmol/L — ABNORMAL LOW (ref 22–32)
Calcium: 8.6 mg/dL — ABNORMAL LOW (ref 8.9–10.3)
Chloride: 100 mmol/L (ref 98–111)
Creatinine, Ser: 0.79 mg/dL (ref 0.44–1.00)
GFR, Estimated: >60 mL/min (ref >60)
Glucose, Bld: 362 mg/dL — ABNORMAL HIGH (ref 70–99)
Potassium: 3.9 mmol/L (ref 3.5–5.1)
Sodium: 129 mmol/L — ABNORMAL LOW (ref 135–145)

## 2023-08-16 LAB — PROTIME-INR
INR: 1.3 — ABNORMAL HIGH (ref 0.8–1.2)
Prothrombin Time: 16.7 s — ABNORMAL HIGH (ref 11.4–15.2)

## 2023-08-16 LAB — GLUCOSE, CAPILLARY
Glucose-Capillary: 314 mg/dL — ABNORMAL HIGH (ref 70–99)
Glucose-Capillary: 370 mg/dL — ABNORMAL HIGH (ref 70–99)
Glucose-Capillary: 278 mg/dL — ABNORMAL HIGH (ref 70–99)
Glucose-Capillary: 284 mg/dL — ABNORMAL HIGH (ref 70–99)
Glucose-Capillary: 285 mg/dL — ABNORMAL HIGH (ref 70–99)
Glucose-Capillary: 281 mg/dL — ABNORMAL HIGH (ref 70–99)

## 2023-08-16 LAB — PHOSPHORUS
Phosphorus: 2.8 mg/dL (ref 2.5–4.6)
Phosphorus: 2 mg/dL — ABNORMAL LOW (ref 2.5–4.6)

## 2023-08-16 LAB — MAGNESIUM
Magnesium: 2.2 mg/dL (ref 1.7–2.4)
Magnesium: 1.9 mg/dL (ref 1.7–2.4)

## 2023-08-16 MED ORDER — INSULIN GLARGINE-YFGN 100 UNIT/ML ~~LOC~~ SOLN
20.0000 [IU] | Freq: Two times a day (BID) | SUBCUTANEOUS | Status: DC
  Administered 2023-08-16 (×2): 20 [IU] via SUBCUTANEOUS
  Filled 2023-08-16 (×4): qty 0.2

## 2023-08-16 MED ORDER — HYDRALAZINE HCL 25 MG PO TABS
25.0000 mg | ORAL_TABLET | Freq: Three times a day (TID) | ORAL | Status: DC
  Administered 2023-08-16 – 2023-08-17 (×2): 25 mg
  Filled 2023-08-16 (×3): qty 1

## 2023-08-16 MED ORDER — TRAMADOL HCL 50 MG PO TABS: 50.0000 mg | ORAL_TABLET | ORAL | Status: DC | PRN

## 2023-08-16 MED ORDER — OXYCODONE HCL 5 MG PO TABS
5.0000 mg | ORAL_TABLET | ORAL | Status: DC | PRN
  Administered 2023-08-16 – 2023-08-17 (×5): 10 mg
  Filled 2023-08-16 (×5): qty 2

## 2023-08-16 MED ORDER — INSULIN GLARGINE-YFGN 100 UNIT/ML ~~LOC~~ SOLN
15.0000 [IU] | Freq: Every day | SUBCUTANEOUS | Status: DC
  Filled 2023-08-16: qty 0.15

## 2023-08-16 MED ORDER — WARFARIN SODIUM 5 MG PO TABS
5.0000 mg | ORAL_TABLET | Freq: Once | ORAL | Status: AC
  Administered 2023-08-16: 5 mg via ORAL
  Filled 2023-08-16: qty 1

## 2023-08-16 MED ORDER — ANASTROZOLE 1 MG PO TABS
1.0000 mg | ORAL_TABLET | Freq: Every day | ORAL | Status: DC
  Administered 2023-08-17 – 2023-08-22 (×6): 1 mg
  Filled 2023-08-16 (×6): qty 1

## 2023-08-16 MED ORDER — POTASSIUM CHLORIDE 20 MEQ PO PACK
20.0000 meq | PACK | Freq: Two times a day (BID) | ORAL | Status: AC
  Administered 2023-08-16 (×2): 20 meq
  Filled 2023-08-16 (×2): qty 1

## 2023-08-16 MED ORDER — INSULIN GLARGINE-YFGN 100 UNIT/ML ~~LOC~~ SOLN: 15.0000 [IU] | Freq: Every day | SUBCUTANEOUS | Status: DC

## 2023-08-16 MED ORDER — SPIRONOLACTONE 12.5 MG HALF TABLET
12.5000 mg | ORAL_TABLET | Freq: Every day | ORAL | Status: DC
  Administered 2023-08-16 – 2023-08-17 (×2): 12.5 mg
  Filled 2023-08-16: qty 1

## 2023-08-16 MED ORDER — ASPIRIN 81 MG PO CHEW
81.0000 mg | CHEWABLE_TABLET | Freq: Every day | ORAL | Status: DC
  Administered 2023-08-16 – 2023-08-22 (×7): 81 mg
  Filled 2023-08-16 (×7): qty 1

## 2023-08-16 MED ORDER — SENNOSIDES-DOCUSATE SODIUM 8.6-50 MG PO TABS
1.0000 | ORAL_TABLET | Freq: Every day | ORAL | Status: DC
  Administered 2023-08-16: 1
  Filled 2023-08-16: qty 1

## 2023-08-16 MED ORDER — WARFARIN SODIUM 2.5 MG PO TABS: 2.5000 mg | ORAL_TABLET | Freq: Once | ORAL | Status: DC

## 2023-08-16 MED ORDER — FUROSEMIDE 10 MG/ML IJ SOLN
40.0000 mg | Freq: Two times a day (BID) | INTRAMUSCULAR | Status: AC
  Administered 2023-08-16 (×2): 40 mg via INTRAVENOUS
  Filled 2023-08-16: qty 4

## 2023-08-16 MED ORDER — INSULIN GLARGINE-YFGN 100 UNIT/ML ~~LOC~~ SOLN: 15.0000 [IU] | Freq: Two times a day (BID) | SUBCUTANEOUS | Status: DC

## 2023-08-16 MED ORDER — SPIRONOLACTONE 12.5 MG HALF TABLET: 12.5000 mg | ORAL_TABLET | Freq: Every day | ORAL | Status: DC

## 2023-08-16 MED ORDER — FUROSEMIDE 10 MG/ML IJ SOLN
40.0000 mg | Freq: Two times a day (BID) | INTRAMUSCULAR | Status: DC
  Filled 2023-08-16: qty 4

## 2023-08-16 NOTE — Progress Notes (Signed)
 3 Days Post-Op Procedure(s) (LRB): MITRAL VALVE REPLACEMENT USING ON-X PROSTHETIC MITRAL HEART VALVE SIZE 31/33MM (N/A) ECHOCARDIOGRAM, TRANSESOPHAGEAL, INTRAOPERATIVE (N/A) CLIPPING, LEFT ATRIAL APPENDAGE USING ATRICURE LAA EXCLUSION SYSTEM SIZE 40 Subjective:  No specific complaints.    Objective: Vital signs in last 24 hours: Temp:  [98.4 F (36.9 C)-100.4 F (38 C)] 99 F (37.2 C) (05/17 0700) Pulse Rate:  [79-94] 84 (05/17 0700) Cardiac Rhythm: Heart block (05/16 2130) Resp:  [9-38] 20 (05/17 0700) BP: (129-142)/(56-57) 142/56 (05/17 0015) SpO2:  [88 %-98 %] 94 % (05/17 0700) Arterial Line BP: (99-175)/(42-69) 118/54 (05/17 0700) Weight:  [126 kg] 126 kg (05/17 0500)  Hemodynamic parameters for last 24 hours: PAP: (20-35)/(10-28) 31/12 CVP:  [3 mmHg-14 mmHg] 9 mmHg CO:  [5.2 L/min-5.4 L/min] 5.3 L/min CI:  [2.36 L/min/m2-2.46 L/min/m2] 2.4 L/min/m2  Intake/Output from previous day: 05/16 0701 - 05/17 0700 In: 2882.2 [I.V.:617.2; NG/GT:812.5; IV Piggyback:1452.5] Out: 2045 [Urine:1425; Chest Tube:620] Intake/Output this shift: No intake/output data recorded.  General appearance: alert and cooperative Neurologic: intact Heart: regular rate and rhythm Lungs: clear to auscultation bilaterally Abdomen: soft, non-tender; bowel sounds normal Extremities: edema moderate Wound: incision healing well  Lab Results: Recent Labs    08/15/23 1517 08/16/23 0509  WBC 13.9* 11.3*  HGB 10.0* 9.6*  HCT 30.1* 29.2*  PLT 142* 153   BMET:  Recent Labs    08/15/23 1517 08/16/23 0509  NA 133* 129*  K 4.2 3.9  CL 100 100  CO2 19* 20*  GLUCOSE 236* 362*  BUN 8 16  CREATININE 0.78 0.79  CALCIUM  9.0 8.6*    PT/INR:  Recent Labs    08/16/23 0509  LABPROT 16.7*  INR 1.3*   ABG    Component Value Date/Time   PHART 7.465 (H) 08/14/2023 1700   HCO3 25.0 08/14/2023 1700   TCO2 26 08/14/2023 1700   ACIDBASEDEF 1.0 08/08/2023 1634   O2SAT 58.2 08/16/2023 0509    CBG (last 3)  Recent Labs    08/15/23 2302 08/16/23 0321 08/16/23 0759  GLUCAP 277* 314* 370*   CXR: small pleural effusions and atelectasis in lung bases. Mild edema.  Assessment/Plan: S/P Procedure(s) (LRB): MITRAL VALVE REPLACEMENT USING ON-X PROSTHETIC MITRAL HEART VALVE SIZE 31/33MM (N/A) ECHOCARDIOGRAM, TRANSESOPHAGEAL, INTRAOPERATIVE (N/A) CLIPPING, LEFT ATRIAL APPENDAGE USING ATRICURE LAA EXCLUSION SYSTEM SIZE 40  POD 3 Hemodynamics stable on milrinone  0.25. Co-ox 58%. AHF team will decide about weaning further.  Wt is still about 23 lbs over preop, 7 lbs up from yesterday. + 837 cc yesterday but did not get diuresed so will give lasix  40 IV bid today. Replace K+  Hyperglycemia: Increase SEMGLEE  to 25 bid and continue SSI every 4 hrs.   Tube feeds for nutrition.  CT output is decreasing but not ready to remove yet.  DC pacing wires today.  Coumadin  5 mg today for mechanical valve. INR 1.3.     LOS: 14 days    Melissa Bray 08/16/2023

## 2023-08-16 NOTE — Progress Notes (Signed)
 Patient ID: Melissa Bray, female   DOB: 05-Feb-1961, 63 y.o.   MRN: [id number]     Advanced Heart Failure Rounding Note  Cardiologist: Eilleen Grates, MD  Chief Complaint: CHF Subjective:    63 y/o obese woman admitted with cardiogenic shock in setting of severe MR due to endocarditis and perforation of anterior MV leaflet. Underwent mMVR on 08/14/23. Post-op course c/b RV Failure  POD#3 MVR  Weak. Denies CP or SOB  Objective:   Bedside echo 5/15 with RV mod dysfunction   Weight Range: 126 kg Body mass index is 46.22 kg/m.   Vital Signs:   Temp:  [98.4 F (36.9 C)-100.4 F (38 C)] 99 F (37.2 C) (05/17 0700) Pulse Rate:  [79-93] 84 (05/17 0700) Resp:  [14-35] 20 (05/17 0700) BP: (129-142)/(56-57) 142/56 (05/17 0015) SpO2:  [88 %-98 %] 94 % (05/17 0700) Arterial Line BP: (99-175)/(42-68) 118/54 (05/17 0700) Weight:  [126 kg] 126 kg (05/17 0500) Last BM Date : 08/12/23  Weight change: Filed Weights   08/13/23 0500 08/14/23 0500 08/16/23 0500  Weight: 115.5 kg 122.7 kg 126 kg   Intake/Output:  Intake/Output Summary (Last 24 hours) at 08/16/2023 1120 Last data filed at 08/16/2023 0700 Gross per 24 hour  Intake 2524.34 ml  Output 1610 ml  Net 914.34 ml    Physical Exam   General:  Obese woman lying flat in bed No resp difficulty HEENT: normal Neck: supple. + swan . Cor: Sternal wound. Regular rate & rhythm. + mechanical s2 Lungs: clear Abdomen: obesesoft, nontender, nondistended. No hepatosplenomegaly. No bruits or masses. Good bowel sounds. Extremities: no cyanosis, clubbing, rash,1+ edema Neuro: alert & orientedx3, cranial nerves grossly intact. moves all 4 extremities w/o difficulty. Affect pleasant   Telemetry   NSR 80-90s Personally reviewed  Labs    CBC Recent Labs    08/13/23 1749 08/14/23 0400 08/15/23 1517 08/16/23 0509  WBC 9.8   < > 13.9* 11.3*  NEUTROABS 7.4  --   --   --   HGB 10.2*  9.6*   < > 10.0* 9.6*  HCT 30.0*  28.4*   < > 30.1*  29.2*  MCV 86.9   < > 87.2 87.4  PLT 105*   < > 142* 153   < > = values in this interval not displayed.   Basic Metabolic Panel Recent Labs    40/98/11 1517 08/16/23 0509  NA 133* 129*  K 4.2 3.9  CL 100 100  CO2 19* 20*  GLUCOSE 236* 362*  BUN 8 16  CREATININE 0.78 0.79  CALCIUM  9.0 8.6*  MG 1.6* 2.2  PHOS 3.0 2.8   Imaging   DG Chest Port 1 View Result Date: 08/16/2023 CLINICAL DATA:  [id number].  Status post MVR. EXAM: PORTABLE CHEST 1 VIEW COMPARISON:  Portable chest yesterday at 5:21 a.m. FINDINGS: 5:37 a.m. Feeding tube passes into the stomach with the tip not included. Stable right IJ Swan-Ganz line tip in the right pulmonary artery. A left IJ central line terminates at the superior cavoatrial junction as before. Left atrial appendage clip. Two right chest tubes are stable in positioning. Sternotomy and MVR are again shown. Cardiomegaly. Mediastinum is stable. Central vessel prominence continues, with mild but improved central edema, small pleural effusions. There is patchy opacity in the lung bases consistent with atelectasis or consolidation, bilateral perihilar linear platelike atelectasis. No new or worsened lung opacity is seen. No essential change in the overall aeration. IMPRESSION: 1. Stable support apparatus. 2. Cardiomegaly with mild but  improved central edema. 3. Small pleural effusions. 4. Patchy opacity in the lung bases consistent with atelectasis or consolidation. No new or worsened lung opacity is seen. Electronically Signed   By: Denman Fischer M.D.   On: 08/16/2023 07:33    Medications:    Scheduled Medications:  acetaminophen   1,000 mg Oral Q6H   Or   acetaminophen  (TYLENOL ) oral liquid 160 mg/5 mL  1,000 mg Per Tube Q6H   [START ON 08/17/2023] anastrozole   1 mg Per Tube Daily   aspirin   81 mg Per Tube Daily   bisacodyl   10 mg Oral Daily   Or   bisacodyl   10 mg Rectal Daily   Chlorhexidine  Gluconate Cloth  6 each Topical Daily   enoxaparin  (LOVENOX )  injection  40 mg Subcutaneous QHS   feeding supplement (PROSource TF20)  60 mL Per Tube TID   furosemide   40 mg Intravenous BID   hydrALAZINE   25 mg Per Tube Q8H   insulin  aspart  0-24 Units Subcutaneous Q4H   insulin  glargine-yfgn  20 Units Subcutaneous BID   pantoprazole  (PROTONIX ) IV  40 mg Intravenous Daily   potassium chloride   20 mEq Per Tube BID   senna-docusate  1 tablet Per Tube QHS   spironolactone   12.5 mg Per Tube Daily   warfarin  5 mg Oral ONCE-1600   Warfarin - Pharmacist Dosing Inpatient   Does not apply q1600   Infusions:  albumin  human Stopped (08/14/23 0952)   amiodarone  30 mg/hr (08/16/23 0820)   feeding supplement (JEVITY 1.5 CAL/FIBER) 45 mL/hr at 08/16/23 0700   milrinone  0.25 mcg/kg/min (08/16/23 0700)   penicillin  G potassium 12 Million Units in dextrose  5 % 500 mL CONTINUOUS infusion 41.7 mL/hr at 08/16/23 0700   PRN Medications: albumin  human, clonazePAM , dextrose , hydrALAZINE , metoprolol  tartrate, morphine  injection, ondansetron  (ZOFRAN ) IV, mouth rinse, oxyCODONE , traMADol   Assessment/Plan   1. Cardiogenic shock: Valvular shock from severe MR.  TEE on 5/5 showed LV EF 65%, normal RV function, severe MR with perforated leaflet and mobile vegetation.  Patient developed flash pulmonary edema 5/9 in setting of marked hypertension -> IABP placed. - s/p mMVR 08/14/23 - POCUS ECHO 5/15 EF 60% RV mod reduced - Remains on milrinone  0.25 for RV failure Co-ox 58% - Remains volume overloaded. Continue IV diuresis  2. Mitral regurgitation: Severe on TEE with perforated anterior leaflet and mitral valve vegetation.  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  - s/p MVR with Dr. Luna Salinas 08/14/23 - post-op antibiotics per TCTS. Remains on PEN G - warfarin dosing per TCTS INR 1.3 - Mobilize. Encourage IS  3. Acute hypercarbic/hypoxemic respiratory failure in setting of #1&2 - resolved - encourage IS  4. Hypertension - BP improving - add Entresto  soon  5. Morbid obesity - Body mass index is 46.22 kg/m.  6. Hyponatremia - restrict free water  D/w Dr. Sherene Dilling.    Length of Stay: 14  Jules Oar, MD  08/16/2023, 11:20 AM  Advanced Heart Failure Team Pager [phone] (M-F; 7a - 5p)  Please contact [facility] for night-coverage after hours (5p -7a ) and weekends on [email or web address]

## 2023-08-16 NOTE — Plan of Care (Signed)
  Problem: Education: Goal: Knowledge of General Education information will improve Description: Including pain rating scale, medication(s)/side effects and non-pharmacologic comfort measures Outcome: Progressing   Problem: Health Behavior/Discharge Planning: Goal: Ability to manage health-related needs will improve Outcome: Not Progressing   Problem: Clinical Measurements: Goal: Ability to maintain clinical measurements within normal limits will improve Outcome: Progressing Goal: Will remain free from infection Outcome: Progressing Goal: Diagnostic test results will improve Outcome: Progressing Goal: Respiratory complications will improve Outcome: Progressing Goal: Cardiovascular complication will be avoided Outcome: Progressing   Problem: Pain Managment: Goal: General experience of comfort will improve and/or be controlled Outcome: Progressing

## 2023-08-16 NOTE — Progress Notes (Signed)
 NAME:  Melissa Bray, MRN:  [id number], DOB:  11-13-1960, LOS: 14 ADMISSION DATE:  07/31/2023, CONSULTATION DATE:  5/9 REFERRING MD:  Mitzie Anda, CHIEF COMPLAINT:  respiratory failure   History of Present Illness:  [name] is a 63 y/o woman with a history of DM, HTN who presented on 5/1 with cellulitis of her LUE and sepsis. At presentation she complained of fevers, generalized weakness, and reduced PO intake. During her evaluation she was diagnosed with GBS bacteremia and mitral valve endocarditis with leaflet perforation. She required dental extractions for dental carries. Plans have been in place for mitral valve repair by TCTS. She was planned for [facility] today; after coming to the cath lab and going through timeout procedure she was moved to the cath lab table. She very quickly got distressed lying back, trying to sit up. She was placed on bipap for respiratory distress. She suddenly went unresponsive during this time without ongoing agitation. No sedation was given. PCCM was consulted in cath lab for emergent management of respiratory failure.    Pertinent  Medical History  MD HTN HLD Breast cancer, s/p left mastectomy  Significant Hospital Events: Including procedures, antibiotic start and stop dates in addition to other pertinent events   5/1 Admitted  5/9 deteriorated due to flash pulmonary edema, intubated, IABP. 5/12 Extubated  5/13 Remains off vent, overnight needing levophed  gtt restarted but weaning off  5/14 plan for mitral valve replacement, still on IABP 1:1  5/15 mitral valve replacement, remains on vent post op. Extubated, IABP removed 5/16 hypertensive, required multiple doses of hydralazine .  Remain on milrinone   Interim History / Subjective:  No overnight issues Remained afebrile Still on milrinone  at 0.25 Stated feeling better  Objective    Physical Exam  General: Acute on chronically ill-appearing morbidly obese female, lying on the bed HEENT: [place]/AT, eyes  anicteric.  moist mucus membranes Neuro: Alert, awake following commands Chest: Central sternotomy incision looks clean and dry, coarse breath sounds, no wheezes or rhonchi.  Mediastinal chest tube in place Heart: Regular rate and rhythm, no murmurs or gallops Abdomen: Soft, nontender, nondistended, bowel sounds present   Coox 58% Na 129 BS 3062 Chest tube output 620 cc in last 24 hours I/O+ 800 cc in last 24 hours  Resolved Hospital Problem list :  Acute encephalopathy due to hypercapnia Hyperkalemia, resolved> wonder if this was due to succinylcholine  at intubation Hypomagnesemia  Assessment & Plan:  Acute mitral valve infective endocarditis Group A streptococcus bacteremia Severe mitral regurgitation status post mechanical mitral valve replacement Acute HFrEF with cardiogenic shock on milrinone  Acute pulmonary edema due to hypertension, improved Hypervolemic hyponatremia Acute respiratory failure with hypoxia and hypercapnia Poorly controlled diabetes with hyperglycemia Anemia and thrombocytopenia critical illness Morbid obesity  Coox is 58% Advanced heart failure and TCTS following On milrinone  at 0.25 Continue amiodarone  Continue aspirin  and Aldactone  Started on warfarin Surgical cultures are negative Continue penicillin  G continuous infusion to complete 6 weeks therapy per ID recommendations Monitor intake and output Serum sodium is slowly improving Continue to titrate nasal cannula oxygen with O2 sat goal 92% Encourage incentive spirometry and ambulation Blood sugars are not well-controlled Increase Lantus  to 15 units twice daily Continue sliding scale insulin  Monitor H&H  Platelets counts improved to 153 Diet and exercise counseling provided   Best Practice (right click and "Reselect all SmartList Selections" daily)   Diet/type: NPO tube feeds DVT prophylaxis Coumadin  Pressure ulcer(s): Deferred to nursing notes GI prophylaxis: PPI Lines: [employer],  Aline, still  needed Foley:  Yes, and it is still needed Code Status:  full code Last date of multidisciplinary goals of care discussion- 5/13     Trevor Fudge, MD [facility] See Amion for pager If no response to pager, please call [phone] until 7pm After 7pm, Please call E-link [phone]

## 2023-08-16 NOTE — Progress Notes (Addendum)
 PHARMACY - ANTICOAGULATION CONSULT NOTE  Pharmacy Consult for warfarin Indication: onX mMVR  No Known Allergies  Patient Measurements: Height: 5\' 5"  (165.1 cm) Weight: 122.7 kg (270 lb 8.1 oz) IBW/kg (Calculated) : 57 HEPARIN  DW (KG): 84.3  Vital Signs: Temp: 98.8 F (37.1 C) (05/17 0530) Temp Source: Core (05/17 0000) BP: 142/56 (05/17 0015) Pulse Rate: 83 (05/17 0530)  Labs: Recent Labs    08/13/23 1802 08/14/23 0400 08/14/23 0635 08/14/23 1536 08/15/23 0423 08/15/23 1517 08/16/23 0509  HGB  --    < >  --    < > 8.8* 10.0* 9.6*  HCT  --    < >  --    < > 26.7* 30.1* 29.2*  PLT  --    < >  --    < > 123* 142* 153  APTT 51*  --  25  --   --   --   --   LABPROT 16.2*  --   --   --  16.6*  --  16.7*  INR 1.3*  --   --   --  1.3*  --  1.3*  HEPARINUNFRC  --   --   --   --  <0.10*  --   --   CREATININE  --    < >  --    < > 0.84 0.78 0.79   < > = values in this interval not displayed.    Estimated Creatinine Clearance: 93.4 mL/min (by C-G formula based on SCr of 0.79 mg/dL).   Medical History: Past Medical History:  Diagnosis Date   Anxiety    Breast cancer (HCC)    Diabetes mellitus without complication (HCC)    Hyperlipidemia    Hypertension    Type 2 diabetes mellitus without retinopathy (HCC) 06/17/2017    Medications:  Scheduled:   acetaminophen   1,000 mg Oral Q6H   Or   acetaminophen  (TYLENOL ) oral liquid 160 mg/5 mL  1,000 mg Per Tube Q6H   anastrozole   1 mg Oral Daily   aspirin  EC  81 mg Oral Daily   bisacodyl   10 mg Oral Daily   Or   bisacodyl   10 mg Rectal Daily   Chlorhexidine  Gluconate Cloth  6 each Topical Daily   docusate sodium   200 mg Oral Daily   enoxaparin  (LOVENOX ) injection  40 mg Subcutaneous QHS   feeding supplement (PROSource TF20)  60 mL Per Tube TID   hydrALAZINE   25 mg Oral Q8H   insulin  aspart  0-24 Units Subcutaneous Q4H   insulin  glargine-yfgn  7 Units Subcutaneous Daily   pantoprazole  (PROTONIX ) IV  40 mg Intravenous  Daily   sodium chloride  flush  3 mL Intravenous Q12H   sodium chloride  flush  3-10 mL Intravenous Q12H   spironolactone   12.5 mg Oral Daily   Warfarin - Pharmacist Dosing Inpatient   Does not apply q1600    Assessment: 52 yof presenting with acute mitral valve endocarditis with perforated mitral leaflet and severe MR. Underwent dental extraction 5/8. Presented for pre-op RHC and developed flash pulmonary edema requiring intubation - IABP was placed in lab and IV heparin  started. No AC PTA.  Pt now s/p cardiac surgery with onX mMVR placed. Pharmacy consulted to start warfarin 5/16 after IABP removal. INR today remains at 1.3, CBC stable. After discussion with team will increase dose today to get closer to goal INR.  Goal of Therapy:  INR 2.5-3.5 Monitor platelets by anticoagulation protocol: Yes   Plan:  Warfarin 5mg  x1 tonight Daily INR   Albino Alu, PharmD PGY2 Cardiology Pharmacy Resident Please check AMION for all [facility] Pharmacy numbers 08/16/2023

## 2023-08-16 NOTE — Progress Notes (Signed)
 A and V pacer wires removed with tips intact. Patient tolerated well.

## 2023-08-16 NOTE — Progress Notes (Signed)
 Patient ID: Melissa Bray, female   DOB: [birth year], 63 y.o.   MRN: [id number]  TCTS Evening Rounds:  Hemodynamically stable in sinus rhythm on milrinone  0.25 and amio 30.  UO good.  CT output low.  Was up to chair today.

## 2023-08-17 ENCOUNTER — Other Ambulatory Visit: Payer: Self-pay

## 2023-08-17 DIAGNOSIS — I2699 Other pulmonary embolism without acute cor pulmonale: Secondary | ICD-10-CM | POA: Diagnosis not present

## 2023-08-17 DIAGNOSIS — J9601 Acute respiratory failure with hypoxia: Secondary | ICD-10-CM | POA: Diagnosis not present

## 2023-08-17 DIAGNOSIS — J9602 Acute respiratory failure with hypercapnia: Secondary | ICD-10-CM | POA: Diagnosis not present

## 2023-08-17 DIAGNOSIS — I059 Rheumatic mitral valve disease, unspecified: Secondary | ICD-10-CM | POA: Diagnosis not present

## 2023-08-17 LAB — GLUCOSE, CAPILLARY
Glucose-Capillary: 209 mg/dL — ABNORMAL HIGH (ref 70–99)
Glucose-Capillary: 243 mg/dL — ABNORMAL HIGH (ref 70–99)
Glucose-Capillary: 238 mg/dL — ABNORMAL HIGH (ref 70–99)
Glucose-Capillary: 246 mg/dL — ABNORMAL HIGH (ref 70–99)
Glucose-Capillary: 308 mg/dL — ABNORMAL HIGH (ref 70–99)

## 2023-08-17 LAB — BASIC METABOLIC PANEL WITH GFR
Anion gap: 8 (ref 5–15)
BUN: 25 mg/dL — ABNORMAL HIGH (ref 8–23)
CO2: 23 mmol/L (ref 22–32)
Calcium: 8.7 mg/dL — ABNORMAL LOW (ref 8.9–10.3)
Chloride: 102 mmol/L (ref 98–111)
Creatinine, Ser: 0.9 mg/dL (ref 0.44–1.00)
GFR, Estimated: >60 mL/min (ref >60)
Glucose, Bld: 210 mg/dL — ABNORMAL HIGH (ref 70–99)
Potassium: 3.7 mmol/L (ref 3.5–5.1)
Sodium: 133 mmol/L — ABNORMAL LOW (ref 135–145)

## 2023-08-17 LAB — PHOSPHORUS: Phosphorus: 2.6 mg/dL (ref 2.5–4.6)

## 2023-08-17 LAB — PROTIME-INR
INR: 1.4 — ABNORMAL HIGH (ref 0.8–1.2)
Prothrombin Time: 17.4 s — ABNORMAL HIGH (ref 11.4–15.2)

## 2023-08-17 LAB — COOXEMETRY PANEL
Carboxyhemoglobin: 1.1 % (ref 0.5–1.5)
Methemoglobin: <0.7 % (ref 0.0–1.5)
O2 Saturation: 52.1 %
Total hemoglobin: 9.8 g/dL — ABNORMAL LOW (ref 12.0–16.0)

## 2023-08-17 LAB — MAGNESIUM: Magnesium: 1.9 mg/dL (ref 1.7–2.4)

## 2023-08-17 LAB — POTASSIUM: Potassium: 4.2 mmol/L (ref 3.5–5.1)

## 2023-08-17 MED ORDER — MAGNESIUM SULFATE 2 GM/50ML IV SOLN
2.0000 g | Freq: Once | INTRAVENOUS | Status: AC
  Administered 2023-08-17: 2 g via INTRAVENOUS
  Filled 2023-08-17: qty 50

## 2023-08-17 MED ORDER — INSULIN GLARGINE-YFGN 100 UNIT/ML ~~LOC~~ SOLN
25.0000 [IU] | Freq: Two times a day (BID) | SUBCUTANEOUS | Status: DC
  Filled 2023-08-17 (×2): qty 0.25

## 2023-08-17 MED ORDER — POTASSIUM CHLORIDE 20 MEQ PO PACK
20.0000 meq | PACK | ORAL | Status: AC
  Administered 2023-08-17 (×3): 20 meq
  Filled 2023-08-17 (×3): qty 1

## 2023-08-17 MED ORDER — FUROSEMIDE 10 MG/ML IJ SOLN
80.0000 mg | Freq: Once | INTRAMUSCULAR | Status: AC
  Administered 2023-08-17: 80 mg via INTRAVENOUS
  Filled 2023-08-17: qty 8

## 2023-08-17 MED ORDER — FUROSEMIDE 10 MG/ML IJ SOLN
20.0000 mg/h | INTRAVENOUS | Status: DC
  Administered 2023-08-17 – 2023-08-18 (×2): 12 mg/h via INTRAVENOUS
  Administered 2023-08-18 – 2023-08-22 (×8): 20 mg/h via INTRAVENOUS
  Filled 2023-08-17 (×12): qty 20

## 2023-08-17 MED ORDER — WARFARIN SODIUM 5 MG PO TABS
5.0000 mg | ORAL_TABLET | Freq: Once | ORAL | Status: AC
  Administered 2023-08-17: 5 mg via ORAL
  Filled 2023-08-17: qty 1

## 2023-08-17 MED ORDER — MIDODRINE HCL 5 MG PO TABS
10.0000 mg | ORAL_TABLET | Freq: Three times a day (TID) | ORAL | Status: DC
  Administered 2023-08-17 – 2023-08-18 (×3): 10 mg via NASOGASTRIC
  Filled 2023-08-17 (×3): qty 2

## 2023-08-17 MED ORDER — INSULIN GLARGINE-YFGN 100 UNIT/ML ~~LOC~~ SOLN
30.0000 [IU] | Freq: Two times a day (BID) | SUBCUTANEOUS | Status: DC
  Administered 2023-08-17 (×2): 30 [IU] via SUBCUTANEOUS
  Filled 2023-08-17 (×4): qty 0.3

## 2023-08-17 MED ORDER — AMIODARONE IV BOLUS ONLY 150 MG/100ML
150.0000 mg | Freq: Once | INTRAVENOUS | Status: AC
  Administered 2023-08-17: 150 mg via INTRAVENOUS

## 2023-08-17 NOTE — Progress Notes (Signed)
 4 Days Post-Op Procedure(s) (LRB): MITRAL VALVE REPLACEMENT USING ON-X PROSTHETIC MITRAL HEART VALVE SIZE 31/33MM (N/A) ECHOCARDIOGRAM, TRANSESOPHAGEAL, INTRAOPERATIVE (N/A) CLIPPING, LEFT ATRIAL APPENDAGE USING ATRICURE LAA EXCLUSION SYSTEM SIZE 40 Subjective:  No specific complaints. Sitting up in chair.  Rhythm is tachy 120's, looks regular and may be atrial flutter. BP lower but right radial arterial line tracing does not look good.  CI 2.49, PA 30/22, CVP 10. Co-ox 52.1.  +1722 cc yesterday despite lasix  40 IV bid. Only 1340 cc UO despite normal creat.  Objective: Vital signs in last 24 hours: Temp:  [98.2 F (36.8 C)-99.9 F (37.7 C)] 99 F (37.2 C) (05/18 0915) Pulse Rate:  [81-128] 128 (05/18 0915) Cardiac Rhythm: Sinus tachycardia (05/18 0800) Resp:  [13-32] 28 (05/18 0915) BP: (87-149)/(58-97) 115/63 (05/18 0900) SpO2:  [86 %-100 %] 98 % (05/18 0915) Arterial Line BP: (72-144)/(47-105) 72/53 (05/18 0915) Weight:  [125.6 kg] 125.6 kg (05/18 0500)  Hemodynamic parameters for last 24 hours: PAP: (22-52)/(7-30) 30/22 CVP:  [1 mmHg-25 mmHg] 10 mmHg CO:  [5.4 L/min-5.9 L/min] 5.5 L/min CI:  [2.44 L/min/m2-2.67 L/min/m2] 2.49 L/min/m2  Intake/Output from previous day: 05/17 0701 - 05/18 0700 In: 3252.5 [P.O.:240; I.V.:611.7; NG/GT:1400; IV Piggyback:1000.8] Out: 1530 [Urine:1340; Chest Tube:190] Intake/Output this shift: Total I/O In: 254.5 [I.V.:51.1; NG/GT:120; IV Piggyback:83.4] Out: 45 [Urine:35; Chest Tube:10]  General appearance: alert and cooperative Neurologic: intact Heart: regular rate and rhythm Lungs: clear to auscultation bilaterally Abdomen: soft, non-tender; bowel sounds normal Extremities: edema mild Wound: incision healing well  Lab Results: Recent Labs    08/15/23 1517 08/16/23 0509  WBC 13.9* 11.3*  HGB 10.0* 9.6*  HCT 30.1* 29.2*  PLT 142* 153   BMET:  Recent Labs    08/16/23 0509 08/17/23 0413  NA 129* 133*  K 3.9 3.7  CL 100  102  CO2 20* 23  GLUCOSE 362* 210*  BUN 16 25*  CREATININE 0.79 0.90  CALCIUM  8.6* 8.7*    PT/INR:  Recent Labs    08/17/23 0413  LABPROT 17.4*  INR 1.4*   ABG    Component Value Date/Time   PHART 7.465 (H) 08/14/2023 1700   HCO3 25.0 08/14/2023 1700   TCO2 26 08/14/2023 1700   ACIDBASEDEF 1.0 08/08/2023 1634   O2SAT 52.1 08/17/2023 0413   CBG (last 3)  Recent Labs    08/16/23 2314 08/17/23 0418 08/17/23 0832  GLUCAP 281* 209* 243*    Assessment/Plan: S/P Procedure(s) (LRB): MITRAL VALVE REPLACEMENT USING ON-X PROSTHETIC MITRAL HEART VALVE SIZE 31/33MM (N/A) ECHOCARDIOGRAM, TRANSESOPHAGEAL, INTRAOPERATIVE (N/A) CLIPPING, LEFT ATRIAL APPENDAGE USING ATRICURE LAA EXCLUSION SYSTEM SIZE 40  POD 4  Hemodynamics have been labile with tachycardia, question atrial flutter on IV amio. Will give her another bolus this am. AHF team following and will decide about milrinone .   Wt stable from yesterday but still 23 lbs over preop. Did not diurese yesterday with lasix . BP 90/74 this am so she is not going to tolerate diuresis at this time. Hopefully getting HR down may help BP. Will start some midodrine per tube.  Hyperglycemia better but will increase SEMGLEE  to 30 bid.  Tube feeds at goal.  CT output decreased. Will remove.  INR 1.4. Continue Coumadin  per pharmacy for mechanical MV.  PT/OT/IS  LOS: 15 days    Melissa Bray 08/17/2023

## 2023-08-17 NOTE — Progress Notes (Signed)
 NAME:  Teniola Tseng, MRN:  [id number], DOB:  Nov 06, 1960, LOS: 15 ADMISSION DATE:  07/31/2023, CONSULTATION DATE:  5/9 REFERRING MD:  Mitzie Anda, CHIEF COMPLAINT:  respiratory failure   History of Present Illness:  Ms. Kemmer is a 63 y/o woman with a history of DM, HTN who presented on 5/1 with cellulitis of her LUE and sepsis. At presentation she complained of fevers, generalized weakness, and reduced PO intake. During her evaluation she was diagnosed with GBS bacteremia and mitral valve endocarditis with leaflet perforation. She required dental extractions for dental carries. Plans have been in place for mitral valve repair by TCTS. She was planned for [facility] today; after coming to the cath lab and going through timeout procedure she was moved to the cath lab table. She very quickly got distressed lying back, trying to sit up. She was placed on bipap for respiratory distress. She suddenly went unresponsive during this time without ongoing agitation. No sedation was given. PCCM was consulted in cath lab for emergent management of respiratory failure.    Pertinent  Medical History  MD HTN HLD Breast cancer, s/p left mastectomy  Significant Hospital Events: Including procedures, antibiotic start and stop dates in addition to other pertinent events   5/1 Admitted  5/9 deteriorated due to flash pulmonary edema, intubated, IABP. 5/12 Extubated  5/13 Remains off vent, overnight needing levophed  gtt restarted but weaning off  5/14 plan for mitral valve replacement, still on IABP 1:1  5/15 mitral valve replacement, remains on vent post op. Extubated, IABP removed 5/16 hypertensive, required multiple doses of hydralazine .  Remain on milrinone  5/17 remain on milrinone  at 0.25, tachycardic  Interim History / Subjective:  No overnight issues This morning noted to be tachycardic with regular rhythm in 120s Afebrile Denies chest pain or shortness of breath  Objective    Physical Exam  General: Acute on  chronically ill-appearing female, sitting on recliner HEENT: [place]/AT, eyes anicteric.  moist mucus membranes Neuro: Alert, awake following commands, generalized weak Chest: Wound VAC in place, reduced air entry at the bases bilaterally, no wheezes or rhonchi Heart: Tachycardic, regular rhythm, no murmurs or gallops Abdomen: Soft, nontender, nondistended, bowel sounds present   Coox 52% on milrinone  0.25 Na 133 WBC 11.3 H/H 9.6/29 INR 1.4 Chest tube output 190 cc in last 24 hours I/O+ 1700 cc in last 24 hours  Resolved Hospital Problem list :  Acute encephalopathy due to hypercapnia Hyperkalemia, resolved> wonder if this was due to succinylcholine  at intubation Hypomagnesemia  Assessment & Plan:  Acute mitral valve infective endocarditis Group A streptococcus bacteremia Severe mitral regurgitation status post mechanical mitral valve replacement Acute HFrEF with cardiogenic shock on milrinone  Acute pulmonary edema due to hypertension, improved Hypervolemic hyponatremia Acute respiratory failure with hypoxia and hypercapnia Poorly controlled diabetes with hyperglycemia Anemia and thrombocytopenia critical illness Morbid obesity  Coox dropped to 52% this morning She received Lasix  40 mg twice daily yesterday with net +1.7 L Advanced heart failure and TCTS following Remain on milrinone  at 0.25 Started on Lasix  drip this morning Remained tachycardic in 120s, looks regular Was given amiodarone  bolus Continue amiodarone  infusion Continue aspirin  and Aldactone  Continue Coumadin  for mechanical mitral valve with INR 1.4 Surgical cultures are negative Continue penicillin  G continuous infusion to complete 6 weeks therapy per ID recommendations Monitor intake and output Serum sodium is slowly improving, currently at 133 Continue to titrate nasal cannula oxygen with O2 sat goal 92% Encourage incentive spirometry and ambulation Blood sugars are not well-controlled Semglee  was  increased to 30 units twice daily, continue sliding scale insulin  with CBG goal 140-180 Continue sliding scale insulin  Monitor H&H  Platelets counts improved to 153 Diet and exercise counseling provided   Best Practice (right click and "Reselect all SmartList Selections" daily)   Diet/type: NPO tube feeds DVT prophylaxis Coumadin  Pressure ulcer(s): Deferred to nursing notes GI prophylaxis: PPI Lines: Central line, Aline, still needed Foley:  Yes, and it is still needed Code Status:  full code Last date of multidisciplinary goals of care discussion- 5/13     Trevor Fudge, MD [facility] See Amion for pager If no response to pager, please call [phone] until 7pm After 7pm, Please call E-link [phone]

## 2023-08-17 NOTE — Progress Notes (Signed)
 Inpatient Rehab Admissions Coordinator Note:   Per therapy patient was screened for CIR candidacy by Darilyn Storbeck Annell Barrow, CCC-SLP. At this time, pt appears to be a potential candidate for CIR. I will place an order for rehab consult for full assessment, per our protocol.  Please contact me any with questions.   Artemus Larsen, MS, CCC-SLP Admissions Coordinator [phone] 08/17/23 3:51 PM

## 2023-08-17 NOTE — Evaluation (Signed)
 Occupational Therapy Evaluation Patient Details Name: Melissa Bray MRN: [id number] DOB: 07/21/1960 Today's Date: 08/17/2023   History of Present Illness   63 y.o. female presents to [facility] 07/31/23 from [place]. Originally diagnosed with sepsis 2/2 LUE cellulitis with transfer to [facility] after murmur found. Pt with severe mitral regurgitation 2/2 endocarditis. 5/5 TEE. 5/8 extraction of teeth. 5/9 quick deterioration 2/2 flash pulmonary edema and cardiogenic shock before LHC was performed. 5/9-5/12 intubated. 5/9-5/15 IABP. 5/13 MVR. PMHx: T2DM, HTN, obesity, GAD, sinus tachycardia     Clinical Impressions Patient admitted for the diagnosis above.  PTA she lives alone, but has 24 hour assist as needed from family.  Patient remained independent with all ADL, iADL and continues to drive.  Deficits impacting functional status are listed below.  Currently she is needing up to +2 for basic transfers and ADL completion from a sit to stand level.  OT will continue efforts in the acute setting to address deficits, and Patient will benefit from intensive inpatient follow-up therapy, >3 hours/day.  Patient demonstrates the ability to progress to a Mod I level with continued post acute rehab.       If plan is discharge home, recommend the following:   A lot of help with bathing/dressing/bathroom;A lot of help with walking and/or transfers     Functional Status Assessment   Patient has had a recent decline in their functional status and demonstrates the ability to make significant improvements in function in a reasonable and predictable amount of time.     Equipment Recommendations   Tub/shower seat;BSC/3in1     Recommendations for Other Services   Rehab consult     Precautions/Restrictions   Precautions Precautions: Sternal;Fall Precaution Booklet Issued: Yes (comment) Recall of Precautions/Restrictions: Impaired Precaution/Restrictions Comments: Swan-ganz, chest tube, wound-vac,  A-line, NG, [place], catheter Restrictions Weight Bearing Restrictions Per Provider Order: Yes Other Position/Activity Restrictions: sternal     Mobility Bed Mobility Overal bed mobility: Needs Assistance Bed Mobility: Supine to Sit     Supine to sit: Mod assist, +2 for physical assistance, +2 for safety/equipment       Patient Response: Cooperative  Transfers Overall transfer level: Needs assistance Equipment used: 2 person hand held assist Transfers: Sit to/from Stand, Bed to chair/wheelchair/BSC Sit to Stand: Mod assist, +2 physical assistance, +2 safety/equipment     Step pivot transfers: Mod assist, +2 physical assistance, +2 safety/equipment            Balance Overall balance assessment: Mild deficits observed, not formally tested, Needs assistance Sitting-balance support: No upper extremity supported, Feet supported Sitting balance-Leahy Scale: Fair     Standing balance support: Bilateral upper extremity supported, During functional activity Standing balance-Leahy Scale: Poor Standing balance comment: reliant on external support                           ADL either performed or assessed with clinical judgement   ADL Overall ADL's : Needs assistance/impaired Eating/Feeding: Minimal assistance;Sitting   Grooming: Wash/dry hands;Wash/dry face;Minimal assistance;Sitting   Upper Body Bathing: Moderate assistance;Sitting   Lower Body Bathing: Maximal assistance;Sitting/lateral leans;+2 for safety/equipment   Upper Body Dressing : Maximal assistance;Sitting   Lower Body Dressing: Maximal assistance;Sit to/from stand;Sitting/lateral leans;Total assistance;+2 for safety/equipment   Toilet Transfer: Moderate assistance;+2 for physical assistance;Stand-pivot   Toileting- Clothing Manipulation and Hygiene: Total assistance;Sit to/from stand               Vision Baseline Vision/History: 1 Wears glasses  Ability to See in Adequate Light: 0  Adequate Patient Visual Report: No change from baseline Vision Assessment?: Wears glasses for reading;No apparent visual deficits     Perception Perception: Within Functional Limits       Praxis Praxis: WFL       Pertinent Vitals/Pain Pain Assessment Pain Assessment: Faces Faces Pain Scale: Hurts a little bit Pain Location: chest Pain Descriptors / Indicators: Aching, Discomfort Pain Intervention(s): Monitored during session     Extremity/Trunk Assessment Upper Extremity Assessment Upper Extremity Assessment: Generalized weakness;Right hand dominant LUE Deficits / Details: edema noted LUE, shoulder/elbow/hand AROM WFL LUE Sensation: WNL LUE Coordination: WNL   Lower Extremity Assessment Lower Extremity Assessment: Defer to PT evaluation   Cervical / Trunk Assessment Cervical / Trunk Assessment: Normal   Communication Communication Communication: Impaired Factors Affecting Communication: Reduced clarity of speech   Cognition Arousal: Lethargic Behavior During Therapy: Flat affect Cognition: Cognition impaired         Attention impairment (select first level of impairment): Sustained attention Executive functioning impairment (select all impairments): Sequencing                   Following commands: Impaired Following commands impaired: Only follows one step commands consistently     Cueing  General Comments   Cueing Techniques: Verbal cues;Tactile cues   VSS on supplemental O2   Exercises     Shoulder Instructions      Home Living Family/patient expects to be discharged to:: Private residence Living Arrangements: Children Available Help at Discharge: Family;Available 24 hours/day Type of Home: House Home Access: Stairs to enter [employer] of Steps: 2 Entrance Stairs-Rails: None Home Layout: One level     Bathroom Shower/Tub: Corporate treasurer: Yes   Home Equipment:  None   Additional Comments: 24/7 between son and caretaker      Prior Functioning/Environment Prior Level of Function : Independent/Modified Independent;Driving             Mobility Comments: independent without AD, no falls in past 6 months but reports balance has been off for ~5 days; 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: independent    OT Problem List: Decreased strength;Decreased range of motion;Decreased activity tolerance;Impaired balance (sitting and/or standing);Decreased safety awareness;Pain   OT Treatment/Interventions: Self-care/ADL training;Therapeutic exercise;Therapeutic activities;Patient/family education;DME and/or AE instruction;Balance training;Energy conservation      OT Goals(Current goals can be found in the care plan section)   Acute Rehab OT Goals Patient Stated Goal: Return home OT Goal Formulation: With patient Time For Goal Achievement: 09/01/23 Potential to Achieve Goals: Fair ADL Goals Pt Will Perform Grooming: with supervision;standing Pt Will Perform Upper Body Dressing: with supervision;sitting;standing Pt Will Perform Lower Body Dressing: with supervision;sit to/from stand Pt Will Transfer to Toilet: with supervision;ambulating;regular height toilet Pt/caregiver will Perform Home Exercise Program: Increased ROM;Increased strength;Both right and left upper extremity;With Supervision   OT Frequency:  Min 2X/week    Co-evaluation PT/OT/SLP Co-Evaluation/Treatment: Yes Reason for Co-Treatment: For patient/therapist safety;To address functional/ADL transfers PT goals addressed during session: Mobility/safety with mobility;Balance OT goals addressed during session: ADL's and self-care      AM-PAC OT "6 Clicks" Daily Activity     Outcome Measure Help from another person eating meals?: A Little Help from another person taking care of personal grooming?: A Little Help from another person toileting, which includes using toliet, bedpan, or  urinal?: A Lot Help from another person bathing (including washing, rinsing, drying)?: A Lot  Help from another person to put on and taking off regular upper body clothing?: A Lot Help from another person to put on and taking off regular lower body clothing?: A Lot 6 Click Score: 14   End of Session Equipment Utilized During Treatment: Gait belt Nurse Communication: Mobility status  Activity Tolerance: Patient tolerated treatment well Patient left: in bed;with call bell/phone within reach;with bed alarm set  OT Visit Diagnosis: Unsteadiness on feet (R26.81);Muscle weakness (generalized) (M62.81)                Time: 4098-1191 OT Time Calculation (min): 22 min Charges:  OT General Charges $OT Visit: 1 Visit OT Evaluation $OT Eval Moderate Complexity: 1 Mod  08/17/2023  RP, OTR/L  Acute Rehabilitation Services  Office:  [phone]   Benjamen Brand 08/17/2023, 11:21 AM

## 2023-08-17 NOTE — Evaluation (Signed)
 Physical Therapy Evaluation Patient Details Name: Melissa Bray MRN: [id number] DOB: 13-Dec-1960 Today's Date: 08/17/2023  History of Present Illness  63 y.o. female presents to [facility] 07/31/23 from [place]. Originally diagnosed with sepsis 2/2 LUE cellulitis with transfer to [facility] after murmur found. Pt with severe mitral regurgitation 2/2 endocarditis. 5/5 TEE. 5/8 extraction of teeth. 5/9 quick deterioration 2/2 flash pulmonary edema and cardiogenic shock before LHC was performed. 5/9-5/12 intubated. 5/9-5/15 IABP. 5/13 MVR. PMHx: T2DM, HTN, obesity, GAD, sinus tachycardia   Clinical Impression  Pt in bed upon arrival and agreeable to PT eval. PTA, pt was independent for mobility and ADLs with no AD and working full-time. In today's session, pt required ModAx2 for bed mobility and ModAx2 to perform step-pivot with RN assisting with line management. Pt required assist to boost-up and steady with short steps towards recliner. Appeared to have increased difficulty stepping with L LE. Pt has strong family support with 24/7 assist available upon d/c home. Pt would benefit from intensive acute rehab with recommendation for >3hrs post acute rehab. Anticipate pt will progress well with continued mobility. Pt currently with functional limitations due to the deficits listed below (see PT Problem List). Pt would benefit from acute skilled PT to address functional impairments. Acute PT to follow.         If plan is discharge home, recommend the following: A lot of help with walking and/or transfers;A lot of help with bathing/dressing/bathroom;Assistance with cooking/housework;Assist for transportation;Help with stairs or ramp for entrance   Can travel by private vehicle    No    Equipment Recommendations Rolling walker (2 wheels);BSC/3in1  Recommendations for Other Services  Rehab consult    Functional Status Assessment Patient has had a recent decline in their functional status and demonstrates the  ability to make significant improvements in function in a reasonable and predictable amount of time.     Precautions / Restrictions Precautions Precautions: Sternal;Fall Precaution Booklet Issued: Yes (comment) Recall of Precautions/Restrictions: Impaired Precaution/Restrictions Comments: Swan-ganz, chest tube, wound-vac, A-line, NG, [place], catheter Restrictions Other Position/Activity Restrictions: sternal      Mobility  Bed Mobility Overal bed mobility: Needs Assistance Bed Mobility: Supine to Sit    Supine to sit: Mod assist, +2 for physical assistance, +2 for safety/equipment    General bed mobility comments: Able to move LE's off EOB, ModAx2 with pt holding heart pillow to move into long-sitting. x3 for line management    Transfers Overall transfer level: Needs assistance Equipment used: 2 person hand held assist Transfers: Sit to/from Stand, Bed to chair/wheelchair/BSC Sit to Stand: Mod assist, +2 physical assistance, +2 safety/equipment   Step pivot transfers: Mod assist, +2 physical assistance, +2 safety/equipment     General transfer comment: pt holding heart pillow, ModAx2 for boost-up and steadying. Able to take short and choppy steps towards reclined with difficulty stepping with LLE. x3 for line management    Ambulation/Gait    General Gait Details: deferred 2/2 safety and swan-ganz    Balance Overall balance assessment: Mild deficits observed, not formally tested, Needs assistance Sitting-balance support: No upper extremity supported, Feet supported Sitting balance-Leahy Scale: Fair     Standing balance support: Bilateral upper extremity supported, During functional activity Standing balance-Leahy Scale: Poor Standing balance comment: reliant on external support        Pertinent Vitals/Pain Pain Assessment Pain Assessment: Faces Faces Pain Scale: Hurts a little bit Pain Location: chest Pain Descriptors / Indicators: Aching, Discomfort Pain  Intervention(s): Limited activity within patient's tolerance,  Monitored during session, Repositioned    Home Living Family/patient expects to be discharged to:: Private residence Living Arrangements: Children (son) Available Help at Discharge: Family;Available 24 hours/day Type of Home: House Home Access: Stairs to enter Entrance Stairs-Rails: None Entrance Stairs-Number of Steps: 2   Home Layout: One level Home Equipment: None Additional Comments: 24/7 between son and caretaker    Prior Function Prior Level of Function : Independent/Modified Independent;Driving    Mobility Comments: independent without AD, no falls in past 6 months but reports balance has been off for ~5 days; 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: independent     Extremity/Trunk Assessment   Upper Extremity Assessment Upper Extremity Assessment: Defer to OT evaluation    Lower Extremity Assessment Lower Extremity Assessment: Generalized weakness    Cervical / Trunk Assessment Cervical / Trunk Assessment: Normal  Communication   Communication Communication: Impaired Factors Affecting Communication: Reduced clarity of speech    Cognition Arousal: Alert Behavior During Therapy: Flat affect   PT - Cognitive impairments: No apparent impairments    PT - Cognition Comments: A&Ox4 Following commands: Impaired Following commands impaired: Only follows one step commands consistently     Cueing Cueing Techniques: Verbal cues, Tactile cues      PT Assessment Patient needs continued PT services  PT Problem List Decreased strength;Decreased activity tolerance;Decreased balance;Decreased mobility;Decreased knowledge of precautions;Cardiopulmonary status limiting activity       PT Treatment Interventions DME instruction;Gait training;Stair training;Functional mobility training;Therapeutic activities;Therapeutic exercise;Balance training;Neuromuscular re-education;Patient/family education    PT Goals  (Current goals can be found in the Care Plan section)  Acute Rehab PT Goals Patient Stated Goal: to get better PT Goal Formulation: With patient Time For Goal Achievement: 08/31/23 Potential to Achieve Goals: Good    Frequency Min 2X/week     Co-evaluation   Reason for Co-Treatment: For patient/therapist safety;To address functional/ADL transfers PT goals addressed during session: Mobility/safety with mobility;Balance         AM-PAC PT "6 Clicks" Mobility  Outcome Measure Help needed turning from your back to your side while in a flat bed without using bedrails?: Total Help needed moving from lying on your back to sitting on the side of a flat bed without using bedrails?: Total Help needed moving to and from a bed to a chair (including a wheelchair)?: Total Help needed standing up from a chair using your arms (e.g., wheelchair or bedside chair)?: Total Help needed to walk in hospital room?: Total Help needed climbing 3-5 steps with a railing? : Total 6 Click Score: 6    End of Session Equipment Utilized During Treatment: Oxygen Activity Tolerance: Patient tolerated treatment well Patient left: in chair;with call bell/phone within reach;with nursing/sitter in room Nurse Communication: Mobility status (RN present during session) PT Visit Diagnosis: Unsteadiness on feet (R26.81);Other abnormalities of gait and mobility (R26.89);Muscle weakness (generalized) (M62.81)    Time: 0940-1005 PT Time Calculation (min) (ACUTE ONLY): 25 min   Charges:   PT Evaluation $PT Eval Moderate Complexity: 1 Mod   PT General Charges $$ ACUTE PT VISIT: 1 Visit       Orysia Blas, PT, DPT Secure Chat Preferred  Rehab Office [phone]   Alissa April Adela Ades 08/17/2023, 10:16 AM

## 2023-08-17 NOTE — Progress Notes (Signed)
 Patient ID: Melissa Bray, female   DOB: September 14, 1960, 63 y.o.   MRN: [id number]  TCTS Evening Rounds:  Hemodynamics ok. 139/86, P 120 ST. Amio at 30. Still on milrinone  0.25  Started on lasix  drip today at 12 and UO 350/hr for the first couple hours.  Had 2 BM's today.  Will check K+ level this evening.

## 2023-08-17 NOTE — Progress Notes (Signed)
 PHARMACY - ANTICOAGULATION CONSULT NOTE  Pharmacy Consult for warfarin Indication: Melissa Bray  No Known Allergies  Patient Measurements: Height: 5\' 5"  (165.1 cm) Weight: 126 kg (277 lb 12.5 oz) IBW/kg (Calculated) : 57 HEPARIN  DW (KG): 84.3  Vital Signs: Temp: 99.1 F (37.3 C) (05/18 0600) Temp Source: Core (05/18 0400) BP: 107/95 (05/18 0400) Pulse Rate: 127 (05/18 0600)  Labs: Recent Labs    08/15/23 0423 08/15/23 1517 08/16/23 0509 08/17/23 0413  HGB 8.8* 10.0* 9.6*  --   HCT 26.7* 30.1* 29.2*  --   PLT 123* 142* 153  --   LABPROT 16.6*  --  16.7* 17.4*  INR 1.3*  --  1.3* 1.4*  HEPARINUNFRC <0.10*  --   --   --   CREATININE 0.84 0.78 0.79 0.90    Estimated Creatinine Clearance: 84.3 mL/min (by C-G formula based on SCr of 0.9 mg/dL).   Medical History: Past Medical History:  Diagnosis Date   Anxiety    Breast cancer (HCC)    Diabetes mellitus without complication (HCC)    Hyperlipidemia    Hypertension    Type 2 diabetes mellitus without retinopathy (HCC) 06/17/2017    Medications:  Scheduled:   acetaminophen   1,000 mg Oral Q6H   Or   acetaminophen  (TYLENOL ) oral liquid 160 mg/5 mL  1,000 mg Per Tube Q6H   anastrozole   1 mg Per Tube Daily   aspirin   81 mg Per Tube Daily   bisacodyl   10 mg Oral Daily   Or   bisacodyl   10 mg Rectal Daily   Chlorhexidine  Gluconate Cloth  6 each Topical Daily   enoxaparin  (LOVENOX ) injection  40 mg Subcutaneous QHS   feeding supplement (PROSource TF20)  60 mL Per Tube TID   hydrALAZINE   25 mg Per Tube Q8H   insulin  aspart  0-24 Units Subcutaneous Q4H   insulin  glargine-yfgn  20 Units Subcutaneous BID   pantoprazole  (PROTONIX ) IV  40 mg Intravenous Daily   potassium chloride   20 mEq Per Tube Q4H   senna-docusate  1 tablet Per Tube QHS   spironolactone   12.5 mg Per Tube Daily   Warfarin - Pharmacist Dosing Inpatient   Does not apply q1600    Assessment: 59 yof presenting with acute mitral valve endocarditis with  perforated mitral leaflet and severe MR. Underwent dental extraction 5/8. Presented for pre-op RHC and developed flash pulmonary edema requiring intubation - IABP was placed in lab and IV heparin  started. No AC PTA.  Pt now s/p cardiac surgery with Melissa Bray placed. Pharmacy consulted to start warfarin 5/16 after IABP removal. INR today up slightly to 1.4 (+0.1), CBC stable. After discussion with team yesterday, will plan to use higher doses to get to goal INR in a timely manner.  Goal of Therapy:  INR 2.5-3.5 Monitor platelets by anticoagulation protocol: Yes   Plan:  Warfarin 5mg  x1 tonight Daily INR, CBC, and s/sx of bleeding   Albino Alu, PharmD PGY2 Cardiology Pharmacy Resident Please check AMION for all [facility] Pharmacy numbers 08/17/2023

## 2023-08-17 NOTE — Progress Notes (Addendum)
 Patient ID: Melissa Bray, female   DOB: 1961/03/02, 63 y.o.   MRN: [id number]     Advanced Heart Failure Rounding Note  Cardiologist: Eilleen Grates, MD  Chief Complaint: CHF Subjective:    63 y/o obese woman admitted with cardiogenic shock in setting of severe MR due to endocarditis and perforation of anterior MV leaflet. Underwent mMVR on 08/14/23. Post-op course c/b RV Failure  POD#4 MVR  Remains on milrinone  0.25  Minimal diuresis with lasix  40 IV bid   HR up to 120s. Looks like AFL  Denies CP or SOB.  Sitting up in chair  PAP: (22-52)/(7-30) 43/24 CVP:  [1 mmHg-44 mmHg] 19 mmHg CO:  [5.4 L/min-5.9 L/min] 5.5 L/min CI:  [2.44 L/min/m2-2.67 L/min/m2] 2.49 L/min/m2   Objective:    Weight Range: 125.6 kg Body mass index is 46.08 kg/m.   Vital Signs:   Temp:  [98.2 F (36.8 C)-99.9 F (37.7 C)] 99 F (37.2 C) (05/18 0915) Pulse Rate:  [81-128] 128 (05/18 0915) Resp:  [13-32] 28 (05/18 0915) BP: (87-149)/(58-97) 115/63 (05/18 0900) SpO2:  [86 %-100 %] 98 % (05/18 0915) Arterial Line BP: (72-144)/(47-105) 72/53 (05/18 0915) Weight:  [125.6 kg] 125.6 kg (05/18 0500) Last BM Date : 08/12/23  Weight change: Filed Weights   08/14/23 0500 08/16/23 0500 08/17/23 0500  Weight: 122.7 kg 126 kg 125.6 kg   Intake/Output:  Intake/Output Summary (Last 24 hours) at 08/17/2023 1027 Last data filed at 08/17/2023 0900 Gross per 24 hour  Intake 3070.72 ml  Output 1230 ml  Net 1840.72 ml    Physical Exam   General:  Sitting up in chair No resp difficulty HEENT: normal Neck: JVP to jaw Carotids 2+ bilat; no bruits. No lymphadenopathy or thryomegaly appreciated. WUJ:WJXBJYN dressing ok . Regular tachyMechanical s1 Lungs: clear Abdomen: obese soft, nontender, nondistended. No hepatosplenomegaly. No bruits or masses. Good bowel sounds. Extremities: no cyanosis, clubbing, rash, 2+ edema Neuro: alert & orientedx3, cranial nerves grossly intact. moves all 4 extremities w/o  difficulty. Affect pleasant   Telemetry   AFL 120s Personally reviewed  Labs    CBC Recent Labs    08/15/23 1517 08/16/23 0509  WBC 13.9* 11.3*  HGB 10.0* 9.6*  HCT 30.1* 29.2*  MCV 87.2 87.4  PLT 142* 153   Basic Metabolic Panel Recent Labs    82/95/62 0509 08/16/23 1848 08/17/23 0413  NA 129*  --  133*  K 3.9  --  3.7  CL 100  --  102  CO2 20*  --  23  GLUCOSE 362*  --  210*  BUN 16  --  25*  CREATININE 0.79  --  0.90  CALCIUM  8.6*  --  8.7*  MG 2.2 1.9 1.9  PHOS 2.8 2.0* 2.6   Imaging   No results found.   Medications:    Scheduled Medications:  acetaminophen   1,000 mg Oral Q6H   Or   acetaminophen  (TYLENOL ) oral liquid 160 mg/5 mL  1,000 mg Per Tube Q6H   anastrozole   1 mg Per Tube Daily   aspirin   81 mg Per Tube Daily   Chlorhexidine  Gluconate Cloth  6 each Topical Daily   enoxaparin  (LOVENOX ) injection  40 mg Subcutaneous QHS   feeding supplement (PROSource TF20)  60 mL Per Tube TID   hydrALAZINE   25 mg Per Tube Q8H   insulin  aspart  0-24 Units Subcutaneous Q4H   insulin  glargine-yfgn  30 Units Subcutaneous BID   pantoprazole  (PROTONIX ) IV  40 mg Intravenous Daily  potassium chloride   20 mEq Per Tube Q4H   senna-docusate  1 tablet Per Tube QHS   spironolactone   12.5 mg Per Tube Daily   warfarin  5 mg Oral ONCE-1600   Warfarin - Pharmacist Dosing Inpatient   Does not apply q1600   Infusions:  amiodarone  30 mg/hr (08/17/23 0900)   amiodarone  150 mg (08/17/23 1025)   feeding supplement (JEVITY 1.5 CAL/FIBER) 45 mL/hr at 08/17/23 0900   milrinone  0.25 mcg/kg/min (08/17/23 0900)   penicillin  G potassium 12 Million Units in dextrose  5 % 500 mL CONTINUOUS infusion 41.7 mL/hr at 08/17/23 0900   PRN Medications: clonazePAM , dextrose , hydrALAZINE , metoprolol  tartrate, morphine  injection, ondansetron  (ZOFRAN ) IV, mouth rinse, oxyCODONE , traMADol   Assessment/Plan   1. Cardiogenic shock: Valvular shock from severe MR.  TEE on 5/5 showed LV EF 65%,  normal RV function, severe MR with perforated leaflet and mobile vegetation.  Patient developed flash pulmonary edema 5/9 in setting of marked hypertension -> IABP placed. - s/p mMVR 08/14/23 - POCUS ECHO 5/15 EF 60% RV mod reduced - Remains on milrinone  0.25 for RV failure Co-ox 58% -> 52% - Remains markedly volume overloaded.Will bolus lasix  80 IV and then start gtt at 12/hr - If not diuresing can add NE for support as needed  2. Mitral regurgitation: Severe on TEE with perforated anterior leaflet and mitral valve vegetation.  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  - s/p MVR with Dr. Luna Salinas 08/14/23 - post-op antibiotics per TCTS. Remains on PEN G - warfarin dosing per TCTS INR 1.4 - Continue to mobilize. Encourage IS  3. Acute hypercarbic/hypoxemic respiratory failure in setting of #1&2 - resolved - encourage IS  4. Atrial flutter with RVR - start amio   5. Morbid obesity - Body mass index is 46.08 kg/m.  6. Hyponatremia - restrict free water  7. Hypokalemia/hypomag - supp  CRITICAL CARE Performed by: Cassey Hurrell  Total critical care time: 45 minutes  Critical care time was exclusive of separately billable procedures and treating other patients.  Critical care was necessary to treat or prevent imminent or life-threatening deterioration.  Critical care was time spent personally by me (independent of midlevel providers or residents) on the following activities: development of treatment plan with patient and/or surrogate as well as nursing, discussions with consultants, evaluation of patient's response to treatment, examination of patient, obtaining history from patient or surrogate, ordering and performing treatments and interventions, ordering and review of laboratory studies, ordering and review of radiographic studies, pulse oximetry and re-evaluation of patient's condition.   D/w Dr. Sherene Dilling.    Length of Stay: 15  Jules Oar, MD   08/17/2023, 10:27 AM  Advanced Heart Failure Team Pager [phone] (M-F; 7a - 5p)  Please contact [facility] for night-coverage after hours (5p -7a ) and weekends on [email or web address]

## 2023-08-18 ENCOUNTER — Other Ambulatory Visit: Payer: Self-pay

## 2023-08-18 ENCOUNTER — Inpatient Hospital Stay (HOSPITAL_COMMUNITY)

## 2023-08-18 DIAGNOSIS — J810 Acute pulmonary edema: Secondary | ICD-10-CM | POA: Diagnosis not present

## 2023-08-18 DIAGNOSIS — J9602 Acute respiratory failure with hypercapnia: Secondary | ICD-10-CM | POA: Diagnosis not present

## 2023-08-18 DIAGNOSIS — J9601 Acute respiratory failure with hypoxia: Secondary | ICD-10-CM | POA: Diagnosis not present

## 2023-08-18 DIAGNOSIS — R570 Cardiogenic shock: Secondary | ICD-10-CM | POA: Diagnosis not present

## 2023-08-18 DIAGNOSIS — I340 Nonrheumatic mitral (valve) insufficiency: Secondary | ICD-10-CM | POA: Diagnosis not present

## 2023-08-18 DIAGNOSIS — I059 Rheumatic mitral valve disease, unspecified: Secondary | ICD-10-CM | POA: Diagnosis not present

## 2023-08-18 LAB — COOXEMETRY PANEL
Carboxyhemoglobin: 1.2 % (ref 0.5–1.5)
Methemoglobin: <0.7 % (ref 0.0–1.5)
O2 Saturation: 72.2 %
Total hemoglobin: 9.3 g/dL — ABNORMAL LOW (ref 12.0–16.0)

## 2023-08-18 LAB — HEPATIC FUNCTION PANEL
ALT: 16 U/L (ref 0–44)
AST: 19 U/L (ref 15–41)
Albumin: 2.3 g/dL — ABNORMAL LOW (ref 3.5–5.0)
Alkaline Phosphatase: 107 U/L (ref 38–126)
Bilirubin, Direct: <0.1 mg/dL (ref 0.0–0.2)
Total Bilirubin: 0.4 mg/dL (ref 0.0–1.2)
Total Protein: 6.2 g/dL — ABNORMAL LOW (ref 6.5–8.1)

## 2023-08-18 LAB — AEROBIC/ANAEROBIC CULTURE W GRAM STAIN (SURGICAL/DEEP WOUND)
Culture: NO GROWTH
Gram Stain: NONE SEEN

## 2023-08-18 LAB — CBC
HCT: 27.7 % — ABNORMAL LOW (ref 36.0–46.0)
Hemoglobin: 8.9 g/dL — ABNORMAL LOW (ref 12.0–15.0)
MCH: 28.8 pg (ref 26.0–34.0)
MCHC: 32.1 g/dL (ref 30.0–36.0)
MCV: 89.6 fL (ref 80.0–100.0)
Platelets: 218 10*3/uL (ref 150–400)
RBC: 3.09 MIL/uL — ABNORMAL LOW (ref 3.87–5.11)
RDW: 14.4 % (ref 11.5–15.5)
WBC: 7.8 10*3/uL (ref 4.0–10.5)
nRBC: 0 % (ref 0.0–0.2)

## 2023-08-18 LAB — GLUCOSE, CAPILLARY
Glucose-Capillary: 195 mg/dL — ABNORMAL HIGH (ref 70–99)
Glucose-Capillary: 203 mg/dL — ABNORMAL HIGH (ref 70–99)
Glucose-Capillary: 210 mg/dL — ABNORMAL HIGH (ref 70–99)
Glucose-Capillary: 239 mg/dL — ABNORMAL HIGH (ref 70–99)
Glucose-Capillary: 242 mg/dL — ABNORMAL HIGH (ref 70–99)
Glucose-Capillary: 299 mg/dL — ABNORMAL HIGH (ref 70–99)

## 2023-08-18 LAB — BASIC METABOLIC PANEL WITH GFR
Anion gap: 9 (ref 5–15)
Anion gap: 10 (ref 5–15)
BUN: 28 mg/dL — ABNORMAL HIGH (ref 8–23)
BUN: 27 mg/dL — ABNORMAL HIGH (ref 8–23)
CO2: 25 mmol/L (ref 22–32)
CO2: 26 mmol/L (ref 22–32)
Calcium: 8.6 mg/dL — ABNORMAL LOW (ref 8.9–10.3)
Calcium: 8.3 mg/dL — ABNORMAL LOW (ref 8.9–10.3)
Chloride: 100 mmol/L (ref 98–111)
Chloride: 97 mmol/L — ABNORMAL LOW (ref 98–111)
Creatinine, Ser: 0.91 mg/dL (ref 0.44–1.00)
Creatinine, Ser: 0.97 mg/dL (ref 0.44–1.00)
GFR, Estimated: >60 mL/min (ref >60)
GFR, Estimated: >60 mL/min (ref >60)
Glucose, Bld: 213 mg/dL — ABNORMAL HIGH (ref 70–99)
Glucose, Bld: 262 mg/dL — ABNORMAL HIGH (ref 70–99)
Potassium: 3.9 mmol/L (ref 3.5–5.1)
Potassium: 3.7 mmol/L (ref 3.5–5.1)
Sodium: 134 mmol/L — ABNORMAL LOW (ref 135–145)
Sodium: 133 mmol/L — ABNORMAL LOW (ref 135–145)

## 2023-08-18 LAB — CG4 I-STAT (LACTIC ACID): Lactic Acid, Venous: 1.1 mmol/L (ref 0.5–1.9)

## 2023-08-18 LAB — PROTIME-INR
INR: 2.6 — ABNORMAL HIGH (ref 0.8–1.2)
Prothrombin Time: 28.1 s — ABNORMAL HIGH (ref 11.4–15.2)

## 2023-08-18 MED ORDER — VITAL AF 1.2 CAL PO LIQD
1000.0000 mL | ORAL | Status: DC
  Administered 2023-08-18 – 2023-08-21 (×4): 1000 mL

## 2023-08-18 MED ORDER — INSULIN ASPART 100 UNIT/ML IJ SOLN
3.0000 [IU] | INTRAMUSCULAR | Status: DC
  Administered 2023-08-18 – 2023-08-22 (×19): 3 [IU] via SUBCUTANEOUS

## 2023-08-18 MED ORDER — SODIUM CHLORIDE 0.9% FLUSH
10.0000 mL | Freq: Two times a day (BID) | INTRAVENOUS | Status: DC
  Administered 2023-08-18: 30 mL
  Administered 2023-08-18: 10 mL
  Administered 2023-08-19: 30 mL
  Administered 2023-08-20: 10 mL
  Administered 2023-08-20: 30 mL
  Administered 2023-08-21 – 2023-08-22 (×3): 10 mL
  Administered 2023-08-23: 40 mL
  Administered 2023-08-23 – 2023-08-29 (×11): 10 mL

## 2023-08-18 MED ORDER — POTASSIUM CHLORIDE CRYS ER 20 MEQ PO TBCR: 40.0000 meq | EXTENDED_RELEASE_TABLET | Freq: Two times a day (BID) | ORAL | Status: DC

## 2023-08-18 MED ORDER — ACETAZOLAMIDE 250 MG PO TABS
500.0000 mg | ORAL_TABLET | Freq: Two times a day (BID) | ORAL | Status: AC
  Administered 2023-08-18: 500 mg
  Filled 2023-08-18: qty 2

## 2023-08-18 MED ORDER — VITAL AF 1.2 CAL PO LIQD
1000.0000 mL | ORAL | Status: DC
Start: 2023-08-18 — End: 2023-08-18

## 2023-08-18 MED ORDER — INSULIN GLARGINE-YFGN 100 UNIT/ML ~~LOC~~ SOLN
35.0000 [IU] | Freq: Two times a day (BID) | SUBCUTANEOUS | Status: DC
  Administered 2023-08-18 – 2023-08-20 (×5): 35 [IU] via SUBCUTANEOUS
  Filled 2023-08-18 (×6): qty 0.35

## 2023-08-18 MED ORDER — INSULIN ASPART 100 UNIT/ML IJ SOLN: 3.0000 [IU] | INTRAMUSCULAR | Status: DC

## 2023-08-18 MED ORDER — PROCHLORPERAZINE EDISYLATE 10 MG/2ML IJ SOLN: 10.0000 mg | Freq: Four times a day (QID) | INTRAMUSCULAR | Status: DC | PRN

## 2023-08-18 MED ORDER — WARFARIN SODIUM 1 MG PO TABS
1.0000 mg | ORAL_TABLET | Freq: Once | ORAL | Status: AC
  Administered 2023-08-18: 1 mg
  Filled 2023-08-18: qty 1

## 2023-08-18 MED ORDER — SENNOSIDES-DOCUSATE SODIUM 8.6-50 MG PO TABS: 1.0000 | ORAL_TABLET | Freq: Every evening | ORAL | Status: DC | PRN

## 2023-08-18 MED ORDER — FUROSEMIDE 10 MG/ML IJ SOLN
80.0000 mg | Freq: Once | INTRAMUSCULAR | Status: AC
  Administered 2023-08-18: 80 mg via INTRAVENOUS
  Filled 2023-08-18: qty 8

## 2023-08-18 MED ORDER — POTASSIUM CHLORIDE 20 MEQ PO PACK: 40.0000 meq | PACK | Freq: Two times a day (BID) | ORAL | Status: DC

## 2023-08-18 MED ORDER — POTASSIUM CHLORIDE CRYS ER 20 MEQ PO TBCR: 40.0000 meq | EXTENDED_RELEASE_TABLET | Freq: Once | ORAL | Status: DC

## 2023-08-18 MED ORDER — PROSOURCE TF20 ENFIT COMPATIBL EN LIQD
60.0000 mL | Freq: Every day | ENTERAL | Status: DC
Start: 2023-08-19 — End: 2023-08-22
  Administered 2023-08-19 – 2023-08-22 (×4): 60 mL
  Filled 2023-08-18 (×4): qty 60

## 2023-08-18 MED ORDER — POTASSIUM CHLORIDE 20 MEQ PO PACK
40.0000 meq | PACK | Freq: Once | ORAL | Status: AC
  Administered 2023-08-18: 40 meq
  Filled 2023-08-18: qty 2

## 2023-08-18 MED ORDER — SODIUM CHLORIDE 0.9% FLUSH: 10.0000 mL | INTRAVENOUS | Status: DC | PRN

## 2023-08-18 MED ORDER — ACETAZOLAMIDE 250 MG PO TABS
500.0000 mg | ORAL_TABLET | Freq: Two times a day (BID) | ORAL | Status: DC
  Administered 2023-08-18: 500 mg via ORAL
  Filled 2023-08-18: qty 2

## 2023-08-18 MED ORDER — BANATROL TF EN LIQD
60.0000 mL | Freq: Two times a day (BID) | ENTERAL | Status: DC
Start: 2023-08-18 — End: 2023-08-19
  Administered 2023-08-18 – 2023-08-19 (×3): 60 mL
  Filled 2023-08-18 (×3): qty 60

## 2023-08-18 MED ORDER — WARFARIN SODIUM 1 MG PO TABS: 1.0000 mg | ORAL_TABLET | Freq: Once | ORAL | Status: DC

## 2023-08-18 MED FILL — Lidocaine HCl Local Preservative Free (PF) Inj 2%: INTRAMUSCULAR | Qty: 14 | Status: AC

## 2023-08-18 MED FILL — Potassium Chloride Inj 2 mEq/ML: INTRAVENOUS | Qty: 40 | Status: AC

## 2023-08-18 MED FILL — Heparin Sodium (Porcine) Inj 1000 Unit/ML: Qty: 1000 | Status: AC

## 2023-08-18 NOTE — Progress Notes (Signed)
 EVENING ROUNDS NOTE :     [address]       [place] [id number]             [phone]                 5 Days Post-Op Procedure(s) (LRB): MITRAL VALVE REPLACEMENT USING ON-X PROSTHETIC MITRAL HEART VALVE SIZE 31/33MM (N/A) ECHOCARDIOGRAM, TRANSESOPHAGEAL, INTRAOPERATIVE (N/A) CLIPPING, LEFT ATRIAL APPENDAGE USING ATRICURE LAA EXCLUSION SYSTEM SIZE 40   Total Length of Stay:  LOS: 16 days  Events:   No events    BP 104/74   Pulse (!) 129   Temp 98.9 F (37.2 C) (Oral)   Resp 17   Ht 5\' 5"  (1.651 m)   Wt 124.6 kg   SpO2 100%   BMI 45.71 kg/m   CVP:  [12 mmHg-19 mmHg] 15 mmHg      amiodarone  30 mg/hr (08/18/23 1900)   feeding supplement (VITAL AF 1.2 CAL) 60 mL/hr at 08/18/23 1900   furosemide  (LASIX ) 200 mg in dextrose  5 % 100 mL (2 mg/mL) infusion 20 mg/hr (08/18/23 1900)   milrinone  0.25 mcg/kg/min (08/18/23 1900)   penicillin  G potassium 12 Million Units in dextrose  5 % 500 mL CONTINUOUS infusion 41.7 mL/hr at 08/18/23 1900    I/O last 3 completed shifts: In: 4981.6 [I.V.:1035.5; NG/GT:2398; IV Piggyback:1548.1] Out: 6375 [Urine:6315; Chest Tube:60]      Latest Ref Rng & Units 08/18/2023    4:30 AM 08/16/2023    5:09 AM 08/15/2023    3:17 PM  CBC  WBC 4.0 - 10.5 K/uL 7.8  11.3  13.9   Hemoglobin 12.0 - 15.0 g/dL 8.9  9.6  91.4   Hematocrit 36.0 - 46.0 % 27.7  29.2  30.1   Platelets 150 - 400 K/uL 218  153  142        Latest Ref Rng & Units 08/18/2023    1:30 PM 08/18/2023    4:30 AM 08/17/2023    8:26 PM  BMP  Glucose 70 - 99 mg/dL 782  956    BUN 8 - 23 mg/dL 27  28    Creatinine 2.13 - 1.00 mg/dL 0.86  5.78    Sodium 469 - 145 mmol/L 133  134    Potassium 3.5 - 5.1 mmol/L 3.7  3.9  4.2   Chloride 98 - 111 mmol/L 97  100    CO2 22 - 32 mmol/L 26  25    Calcium  8.9 - 10.3 mg/dL 8.3  8.6      ABG    Component Value Date/Time   PHART 7.465 (H) 08/14/2023 1700   PCO2ART 35.1 08/14/2023 1700   PO2ART 65 (L) 08/14/2023 1700    HCO3 25.0 08/14/2023 1700   TCO2 26 08/14/2023 1700   ACIDBASEDEF 1.0 08/08/2023 1634   O2SAT 72.2 08/18/2023 0430       Kathelene Rumberger, MD 08/18/2023 8:06 PM

## 2023-08-18 NOTE — Plan of Care (Signed)
?  Problem: Clinical Measurements: ?Goal: Will remain free from infection ?Outcome: Progressing ?Goal: Diagnostic test results will improve ?Outcome: Progressing ?Goal: Respiratory complications will improve ?Outcome: Progressing ?  ?

## 2023-08-18 NOTE — Progress Notes (Signed)
 PHARMACY - ANTICOAGULATION CONSULT NOTE  Pharmacy Consult for warfarin Indication: onX mMVR  No Known Allergies  Patient Measurements: Height: 5\' 5"  (165.1 cm) Weight: 124.6 kg (274 lb 11.1 oz) IBW/kg (Calculated) : 57 HEPARIN  DW (KG): 84.3  Vital Signs: Temp: 97.4 F (36.3 C) (05/19 0700) Temp Source: Axillary (05/19 0700) BP: 94/79 (05/19 0800) Pulse Rate: 130 (05/19 0800)  Labs: Recent Labs    08/15/23 1517 08/16/23 0509 08/17/23 0413 08/18/23 0430  HGB 10.0* 9.6*  --  8.9*  HCT 30.1* 29.2*  --  27.7*  PLT 142* 153  --  218  LABPROT  --  16.7* 17.4* 28.1*  INR  --  1.3* 1.4* 2.6*  CREATININE 0.78 0.79 0.90 0.91    Estimated Creatinine Clearance: 82.8 mL/min (by C-G formula based on SCr of 0.91 mg/dL).   Medical History: Past Medical History:  Diagnosis Date   Anxiety    Breast cancer (HCC)    Diabetes mellitus without complication (HCC)    Hyperlipidemia    Hypertension    Type 2 diabetes mellitus without retinopathy (HCC) 06/17/2017    Medications:  Scheduled:   acetaminophen   1,000 mg Oral Q6H   Or   acetaminophen  (TYLENOL ) oral liquid 160 mg/5 mL  1,000 mg Per Tube Q6H   acetaZOLAMIDE   500 mg Oral BID   anastrozole   1 mg Per Tube Daily   aspirin   81 mg Per Tube Daily   Chlorhexidine  Gluconate Cloth  6 each Topical Daily   feeding supplement (PROSource TF20)  60 mL Per Tube TID   furosemide   80 mg Intravenous Once   insulin  aspart  0-24 Units Subcutaneous Q4H   insulin  aspart  3 Units Subcutaneous Q4H   insulin  glargine-yfgn  35 Units Subcutaneous BID   pantoprazole  (PROTONIX ) IV  40 mg Intravenous Daily   senna-docusate  1 tablet Per Tube QHS   Warfarin - Pharmacist Dosing Inpatient   Does not apply q1600    Assessment: 34 yof presenting with acute mitral valve endocarditis with perforated mitral leaflet and severe MR. Underwent dental extraction 5/8. Presented for pre-op RHC and developed flash pulmonary edema requiring intubation - IABP was  placed in lab and IV heparin  started. No AC PTA.  Pt now s/p cardiac surgery with onX mMVR placed. Pharmacy consulted to start warfarin 5/16 after IABP removal 5/15. INR today is up to 2.6 s/p three doses of warfarin. Will give low dose tonight.  Goal of Therapy:  INR 2.5-3.5 Monitor platelets by anticoagulation protocol: Yes   Plan:  Warfarin 1mg  x1 tonight Daily INR  Levin Reamer, PharmD, BCPS, [facility] Clinical Pharmacist [phone] Please check AMION for all [facility] Pharmacy numbers 08/18/2023

## 2023-08-18 NOTE — Progress Notes (Signed)
 Speech Language Pathology Treatment: Dysphagia  Patient Details Name: Melissa Bray MRN: [id number] DOB: January 08, 1961 Today's Date: 08/18/2023 Time: 6644-0347 SLP Time Calculation (min) (ACUTE ONLY): 19 min  Assessment / Plan / Recommendation Clinical Impression  Pt up in chair, has not been sipping water over the weekend. Has felt nauseous, but is better at the moment. Pt able to clear secretions on command. Takesa few sips of water and uses it as an opportunity to further clear secretions with throat clearing. Pt is able to continue clear liquids, but has minimal interest in PO. Could not encourage her to try any solids today. Will f/u for advancement.   HPI HPI: Melissa Bray is a 63 yo female presenting to ED 5/1 with fevers, generalized weakness, and reduced PO intake. Found to have LUE cellulitis and sepsis in addition to GBS bacteremia and mitral valve endocarditis with leaflet perforation. Developed respiratory distress and period of unresponsiveness during cath lab procedure 5/9, requiring intubation. ETT 5/9-5/12. Seen by SLP on 5/13, regurgitated immediately after POs. SLP recommended esopahgram. Pt intubated 5/14 for mitral valve surgery, extubated 5/15. SLP asked to reassess. PMH includes DM, HTN, HLD, breast cancer s/p L mastectomy      SLP Plan  Continue with current plan of care      Recommendations for follow up therapy are one component of a multi-disciplinary discharge planning process, led by the attending physician.  Recommendations may be updated based on patient status, additional functional criteria and insurance authorization.    Recommendations  Diet recommendations: Thin liquid Liquids provided via: Straw;Cup Medication Administration: Via alternative means Supervision: Full supervision/cueing for compensatory strategies Compensations: Slow rate;Small sips/bites Postural Changes and/or Swallow Maneuvers: Seated upright 90 degrees;Upright 30-60 min after meal                   Oral care BID           Continue with current plan of care     Levi Klaiber, Hardin Leys  08/18/2023, 9:38 AM

## 2023-08-18 NOTE — Progress Notes (Signed)
 Patient ID: Melissa Bray, female   DOB: Sep 14, 1960, 63 y.o.   MRN: [id number]     Advanced Heart Failure Rounding Note  Cardiologist: Eilleen Grates, MD   Chief Complaint: CHF  Subjective:    63 y/o obese woman admitted with cardiogenic shock in setting of severe MR due to endocarditis and perforation of anterior MV leaflet. Underwent mMVR on 08/14/23. Post-op course c/b RV Failure  POD#5 MVR  On 0.25 milrinone  with CO-OX 72%. CVP 17. 3L UOP last 24 hrs with lasix  gtt at 12/hr.  Remains in AFL with rate 120-130s on IV amiodarone .  Very weak, needs 2+ assists with transfers. Complaining of nausea.     Objective:    Weight Range: 124.6 kg Body mass index is 45.71 kg/m.   Vital Signs:   Temp:  [97.4 F (36.3 C)-99.3 F (37.4 C)] 97.4 F (36.3 C) (05/19 0700) Pulse Rate:  [118-128] 124 (05/19 0600) Resp:  [7-47] 18 (05/19 0600) BP: (67-123)/(42-99) 121/73 (05/19 0600) SpO2:  [93 %-100 %] 100 % (05/19 0600) Arterial Line BP: (70-145)/(49-93) 86/61 (05/19 0600) Weight:  [124.6 kg] 124.6 kg (05/19 0500) Last BM Date : 08/17/23  Weight change: Filed Weights   08/16/23 0500 08/17/23 0500 08/18/23 0500  Weight: 126 kg 125.6 kg 124.6 kg   Intake/Output:  Intake/Output Summary (Last 24 hours) at 08/18/2023 0816 Last data filed at 08/18/2023 0600 Gross per 24 hour  Intake 2685.49 ml  Output 3230 ml  Net -544.51 ml    Physical Exam   General:  Sitting up in chair.  ENT: + Cortrak Neck: R internal jugular introduer, L internal jugular CVC Cor: Rhythm regular, tachy. No rubs, gallops or murmurs. Lungs: breathing nonlabored Abdomen: obese, soft, nontender, + distended.  Extremities: 1+ edema, + TED hose Neuro: alert & orientedx3. Affect flat.   Telemetry   AFL 120s-130s  Labs    CBC Recent Labs    08/16/23 0509 08/18/23 0430  WBC 11.3* 7.8  HGB 9.6* 8.9*  HCT 29.2* 27.7*  MCV 87.4 89.6  PLT 153 218   Basic Metabolic Panel Recent Labs    40/98/11 1848  08/17/23 0413 08/17/23 2026 08/18/23 0430  NA  --  133*  --  134*  K  --  3.7 4.2 3.9  CL  --  102  --  100  CO2  --  23  --  25  GLUCOSE  --  210*  --  213*  BUN  --  25*  --  28*  CREATININE  --  0.90  --  0.91  CALCIUM   --  8.7*  --  8.6*  MG 1.9 1.9  --   --   PHOS 2.0* 2.6  --   --    Imaging   US  EKG SITE RITE Result Date: 08/17/2023 If Site Rite image not attached, placement could not be confirmed due to current cardiac rhythm.    Medications:    Scheduled Medications:  acetaminophen   1,000 mg Oral Q6H   Or   acetaminophen  (TYLENOL ) oral liquid 160 mg/5 mL  1,000 mg Per Tube Q6H   anastrozole   1 mg Per Tube Daily   aspirin   81 mg Per Tube Daily   Chlorhexidine  Gluconate Cloth  6 each Topical Daily   enoxaparin  (LOVENOX ) injection  40 mg Subcutaneous QHS   feeding supplement (PROSource TF20)  60 mL Per Tube TID   hydrALAZINE   25 mg Per Tube Q8H   insulin  aspart  0-24 Units Subcutaneous Q4H  insulin  glargine-yfgn  30 Units Subcutaneous BID   midodrine   10 mg Per NG tube TID WC   pantoprazole  (PROTONIX ) IV  40 mg Intravenous Daily   senna-docusate  1 tablet Per Tube QHS   spironolactone   12.5 mg Per Tube Daily   Warfarin - Pharmacist Dosing Inpatient   Does not apply q1600   Infusions:  amiodarone  30 mg/hr (08/18/23 0600)   feeding supplement (JEVITY 1.5 CAL/FIBER) 45 mL/hr at 08/18/23 0600   furosemide  (LASIX ) 200 mg in dextrose  5 % 100 mL (2 mg/mL) infusion 12 mg/hr (08/18/23 0600)   milrinone  0.25 mcg/kg/min (08/18/23 0600)   penicillin  G potassium 12 Million Units in dextrose  5 % 500 mL CONTINUOUS infusion 41.7 mL/hr at 08/18/23 0600   PRN Medications: clonazePAM , dextrose , hydrALAZINE , metoprolol  tartrate, morphine  injection, ondansetron  (ZOFRAN ) IV, mouth rinse, oxyCODONE , traMADol   Assessment/Plan   1. Cardiogenic shock: Valvular shock from severe MR.  TEE on 5/5 showed LV EF 65%, normal RV function, severe MR with perforated leaflet and mobile  vegetation.  Patient developed flash pulmonary edema 5/9 in setting of marked hypertension -> IABP placed. - s/p mMVR 08/14/23 - POCUS ECHO 5/15 EF 60% RV mod reduced - CO-OX 72% on 0.25 milrinone . Continue inotrope support while diuresing - Markedly volume overloaded. Up 20 lbs from pre-op. Bolus lasix  80 mg IV then increase gtt to 20/hr. Give 500 mg diamox  twice today. - Remove R internal jugular introducer. Triple lumen PICC ordered. Will remove left internal jugular CVC once PICC placed.  2. Mitral regurgitation: Severe on TEE with perforated anterior leaflet and mitral valve vegetation.  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  - s/p MVR with Dr. Luna Salinas 08/14/23 - post-op antibiotics per ID. On PenG, will require 6 weeks IV abx (start date 05/14). - INR 2.6. Warfarin dosing per Dr. Sherene Dilling - Continue to mobilize. Encourage IS  3. Acute hypercarbic/hypoxemic respiratory failure in setting of #1&2 - resolved - encourage IS  4. Atrial flutter with RVR - Continue IV amiodarone  at 30/hr - Will likely need eventual DCCV after diuresis if she does not convert chemically - Warfarin as above  5. Morbid obesity - Body mass index is 45.71 kg/m.  6. Hyponatremia - restrict free water - continue diuresis  7. Hypokalemia/hypomag - supp with diuresis  CRITICAL CARE Performed by: Gerilyn Kobus N   Total critical care time: 16 minutes  Critical care time was exclusive of separately billable procedures and treating other patients.  Critical care was necessary to treat or prevent imminent or life-threatening deterioration.  Critical care was time spent personally by me on the following activities: development of treatment plan with patient and/or surrogate as well as nursing, discussions with consultants, evaluation of patient's response to treatment, examination of patient, obtaining history from patient or surrogate, ordering and performing treatments and  interventions, ordering and review of laboratory studies, ordering and review of radiographic studies, pulse oximetry and re-evaluation of patient's condition.    Length of Stay: 16  Meridian Scherger N, PA-C  08/18/2023, 8:16 AM  Advanced Heart Failure Team Pager [phone] (M-F; 7a - 5p)  Please contact [facility] for night-coverage after hours (5p -7a ) and weekends on [email or web address]

## 2023-08-18 NOTE — Progress Notes (Signed)
 Nutrition Follow-up  DOCUMENTATION CODES:   Obesity unspecified  INTERVENTION:   Tube Feeding via Cortrak:  Change to Vital AF 1.2 with goal of 60 ml/hr Begin back at 20 ml/hr, titrate by 10 mL q 8 hours until goal rate of 60 ml/hr Pro-Source TF20 60 mL daily TF at goal provides 1808 kcals, 128 g of protein and 1166 mL of free water  Recommend considering insulin  drip (short-term) if unable to get CBGs <200  Add Banatrol TF BID via tube as stool thickening agent- 5g soluble fiber per serving  NUTRITION DIAGNOSIS:   Inadequate oral intake related to acute illness as evidenced by NPO status.  Being addressed via TF   GOAL:   Patient will meet greater than or equal to 90% of their needs  Progressing  MONITOR:   Diet advancement, TF tolerance, Labs, Weight trends  REASON FOR ASSESSMENT:   Consult Enteral/tube feeding initiation and management  ASSESSMENT:   63 yo female admitted with sepsis with cellulitis of LUE, diagnosed with GBS bacteremia and MV endocarditis with consult to TCTS, developed flash pulmonary edema and shock requiring intubation and IABP. PMH includes HTN, HLD, breast cancer s/p left mastectomy, DM, GERD  5/01 Admitted 5/05 TEE: LV EF 65%, normal RV, severe MR with perforated leaflet and mobile vegetation 5/08 Dental Extraction of teeth 11, 14, 30 5/09 Flash pulmonary edema, Intubated, LHC with IABP placed, Code Stroke, Trickle TF initiated 5/12 Extubated, Trickle TF only while on vent 5/14 OR for MVR 5/15 Extubated, IABP removed 5/16 Cortrak placed, TF initiated  Pt on CL diet but not taking in much per RN Jevity 1.5 at 45 ml/hr via Cortrak. +nausea  +large liquid stool while sitting in recliner this AM followed by another large liquid stool in the bed Noted order for senokot daily which is scheduled; recommend changing to prn  Noted post TF initiation on 5/16, CBGs jumped to 300s. CBGs remain uncontrolled but noted insulin  being  adjusted  Lasix  gtt; 3.2 L UOP in 24 hours, 1.6 L thus far today. Weight remains up post MVR but slowly trending down. +edema on exam  Completed Thiamine  x 7 days  Current wt :124.6 kg Admit wt: 120.7 kg (5/01) Lowest wt: 115 kg  Labs: Sodium 133 (L) Potassium 3.7 (wdl) BUN 27 (H) Creatinine wdl Phosphorus 2.6 yesterday (wdl) Magnesium  1.9 wdl  Meds:  Milrinone  gtt Amiodarone  gtt SS novolog  Novolog  3 units q 4 hours Semglee  35 units BID   Diet Order:   Diet Order             Diet clear liquid Room service appropriate? Yes; Fluid consistency: Thin  Diet effective now                   EDUCATION NEEDS:   Not appropriate for education at this time  Skin:  Skin Assessment: Skin Integrity Issues: Skin Integrity Issues:: Wound VAC Wound Vac: chest incision  Last BM:  5/19 +diarrhea  Height:   Ht Readings from Last 1 Encounters:  08/10/23 5\' 5"  (1.651 m)    Weight:   Wt Readings from Last 1 Encounters:  08/18/23 124.6 kg    BMI:  Body mass index is 45.71 kg/m.  Estimated Nutritional Needs:   Kcal:  1700-1900 kcals  Protein:  120-140 gm  Fluid:  1.7 L   Norvel Beer MS, RDN, LDN, CNSC Registered Dietitian 3 Clinical Nutrition RD Inpatient Contact Info in Amion

## 2023-08-18 NOTE — Progress Notes (Signed)
 Inpatient Rehab Admissions Coordinator:    I met with Pt. To discuss potential CIR admit. She is interested, states she lives with her son and has an aide during the day while she works. Pt is not medically ready at this time but I will follow and reach out to family to confirm support.   Wandalee Gust, MS, CCC-SLP Rehab Admissions Coordinator  [phone] (celll) [phone] (office)

## 2023-08-18 NOTE — Progress Notes (Addendum)
 [address]       [place] [address]             [phone]      5 Days Post-Op Procedure(s) (LRB): MITRAL VALVE REPLACEMENT USING ON-X PROSTHETIC MITRAL HEART VALVE SIZE 31/33MM (N/A) ECHOCARDIOGRAM, TRANSESOPHAGEAL, INTRAOPERATIVE (N/A) CLIPPING, LEFT ATRIAL APPENDAGE USING ATRICURE LAA EXCLUSION SYSTEM SIZE 40 Subjective: Patient states she does not feel good this AM. She does admit to nausea and vomiting yesterday.   Objective: Vital signs in last 24 hours: Temp:  [97.4 F (36.3 C)-99.3 F (37.4 C)] 97.4 F (36.3 C) (05/19 0700) Pulse Rate:  [118-128] 124 (05/19 0600) Cardiac Rhythm: Sinus tachycardia (05/18 2100) Resp:  [7-47] 18 (05/19 0600) BP: (67-123)/(42-99) 121/73 (05/19 0600) SpO2:  [93 %-100 %] 100 % (05/19 0600) Arterial Line BP: (70-145)/(49-93) 86/61 (05/19 0600) Weight:  [124.6 kg] 124.6 kg (05/19 0500)  Hemodynamic parameters for last 24 hours: PAP: (27-50)/(9-28) 50/28 CVP:  [6 mmHg-44 mmHg] 10 mmHg  Intake/Output from previous day: 05/18 0701 - 05/19 0700 In: 2827.7 [I.V.:561.9; NG/GT:1300.8; IV Piggyback:965] Out: 3275 [Urine:3215; Chest Tube:60] Intake/Output this shift: No intake/output data recorded.  General appearance: alert, cooperative, and no distress Neurologic: intact Heart: sinus tachycardia vs aflutter Lungs: diminished bibasilar breath sounds Abdomen: soft, non-tender; bowel sounds normal; no masses,  no organomegaly Extremities: edema 2+ BLE Wound: Clean and dry prevena in place working appropriately  Lab Results: Recent Labs    08/16/23 0509 08/18/23 0430  WBC 11.3* 7.8  HGB 9.6* 8.9*  HCT 29.2* 27.7*  PLT 153 218   BMET:  Recent Labs    08/17/23 0413 08/17/23 2026 08/18/23 0430  NA 133*  --  134*  K 3.7 4.2 3.9  CL 102  --  100  CO2 23  --  25  GLUCOSE 210*  --  213*  BUN 25*  --  28*  CREATININE 0.90  --  0.91  CALCIUM  8.7*  --  8.6*    PT/INR:  Recent Labs    08/18/23 0430   LABPROT 28.1*  INR 2.6*   ABG    Component Value Date/Time   PHART 7.465 (H) 08/14/2023 1700   HCO3 25.0 08/14/2023 1700   TCO2 26 08/14/2023 1700   ACIDBASEDEF 1.0 08/08/2023 1634   O2SAT 72.2 08/18/2023 0430   CBG (last 3)  Recent Labs    08/18/23 0001 08/18/23 0448 08/18/23 0734  GLUCAP 299* 210* 203*    Assessment/Plan: S/P Procedure(s) (LRB): MITRAL VALVE REPLACEMENT USING ON-X PROSTHETIC MITRAL HEART VALVE SIZE 31/33MM (N/A) ECHOCARDIOGRAM, TRANSESOPHAGEAL, INTRAOPERATIVE (N/A) CLIPPING, LEFT ATRIAL APPENDAGE USING ATRICURE LAA EXCLUSION SYSTEM SIZE 40  Neuro: Agitation overnight requiring prn clonazepam .   CV: Possible atrial flutter with RVR, HR 120s. On Amiodarone  gtt. SBP 90s, MAP 69. On Midodrine  10mg  TID, Milrinone  0.25, Spironolactone  12.5mg  daily. AHF team is following, appreciate their assistance.   Pulm: Saturating well on 2L [place]. Small bilateral pleural effusions and bibasilar atelectasis. Encourage IS and ambulation  GI: +BM, Cortrak in place. Tube feeds at goal. Nausea per patient and RN, patient admits to vomiting but RN is unsure of vomiting episodes. Continue Zofran  PRN.  Endo: Hyperglycemia, CBGs 299/210/203. Slightly improved on Semglee  30U BID and SSI PRN. Will increase Semglee  to 35U BID.   Renal: Cr 0.91, stable. On Lasix  drip. UO 3215cc/24hrs. -2lbs from yesterday, still about +14lbs from preop. Hyponatremia improving.   ID: Endocarditis, on Penicillin  G. ID recommending IB PCN G for  6 weeks starting from 05/14, end date of 06/25. They have signed off but will arrange outpatient follow up at discharge. Tmax 99.3, no leukocytosis.   Expected postop ABLA: H/H 8.9/27.7, not clinically significant at this time.   DVT Prophylaxis: Coumadin   Deconditioning: Continue work with PT/OT. Possible CIR candidate, CIR will follow.   Anticoagulation: On Coumadin , INR 2.6. Received 5mg  yesterday. Coumadin  dosing per pharmacy.   Dispo: Continue ICU  care   LOS: 16 days    Randa Burton, PA-C 08/18/2023   Chart reviewed, patient examined, agree with above.  BP remains on the low side despite midodrine . Co-ox 72% Good UO on lasix  drip but still only -450 cc yesterday. Creat normal. Can't diurese any faster anyway with borderline bp. INR up to 2.6. Goal with mechanical MV is 2.5-3 at this time. CXR looks better with improved aeration.

## 2023-08-18 NOTE — Progress Notes (Signed)
 eLink Physician-Brief Progress Note Patient Name: Melissa Bray DOB: [birth year] MRN: [id number]   Date of Service  08/18/2023  HPI/Events of Note  Incontinent of stool, INR elevated but having large volume of output.  No clear contraindication to Flexi-Seal  eICU Interventions  Insert rectal tube     Intervention Category Minor Interventions: Routine modifications to care plan (e.g. PRN medications for pain, fever)  Gordon Vandunk 08/18/2023, 7:56 PM

## 2023-08-18 NOTE — Progress Notes (Signed)
 Peripherally Inserted Central Catheter Placement  The IV Nurse has discussed with the patient and/or persons authorized to consent for the patient, the purpose of this procedure and the potential benefits and risks involved with this procedure.  The benefits include less needle sticks, lab draws from the catheter, and the patient may be discharged home with the catheter. Risks include, but not limited to, infection, bleeding, blood clot (thrombus formation), and puncture of an artery; nerve damage and irregular heartbeat and possibility to perform a PICC exchange if needed/ordered by physician.  Alternatives to this procedure were also discussed.  Bard Power PICC patient education guide, fact sheet on infection prevention and patient information card has been provided to patient /or left at bedside.    PICC Placement Documentation  PICC Triple Lumen 08/18/23 Right Basilic 40 cm 1 cm (Active)  Indication for Insertion or Continuance of Line Vasoactive infusions 08/18/23 1125  Exposed Catheter (cm) 1 cm 08/18/23 1125  Site Assessment Clean, Dry, Intact 08/18/23 1125  Lumen #1 Status Flushed;Saline locked;Blood return noted 08/18/23 1125  Lumen #2 Status Flushed;Saline locked;Blood return noted 08/18/23 1125  Lumen #3 Status Flushed;Saline locked;Blood return noted 08/18/23 1125  Dressing Type Transparent;Securing device 08/18/23 1125  Dressing Status Antimicrobial disc/dressing in place;Clean, Dry, Intact 08/18/23 1125  Line Care Connections checked and tightened 08/18/23 1125  Line Adjustment (NICU/IV Team Only) No 08/18/23 1125  Dressing Intervention New dressing;Adhesive placed at insertion site (IV team only) 08/18/23 1125  Dressing Change Due 08/25/23 08/18/23 1125       Lashuna Tamashiro Haywood Lisle 08/18/2023, 11:29 AM

## 2023-08-18 NOTE — Progress Notes (Signed)
 NAME:  Melissa Bray, MRN:  [id number], DOB:  [birth year], LOS: 16 ADMISSION DATE:  07/31/2023, CONSULTATION DATE:  5/9 REFERRING MD:  Melissa Bray, CHIEF COMPLAINT:  respiratory failure   History of Present Illness:  [name] is a 63 y/o woman with a history of DM, HTN who presented on 5/1 with cellulitis of her LUE and sepsis. At presentation she complained of fevers, generalized weakness, and reduced PO intake. During her evaluation she was diagnosed with GBS bacteremia and mitral valve endocarditis with leaflet perforation. She required dental extractions for dental carries. Plans have been in place for mitral valve repair by TCTS. She was planned for [facility] today; after coming to the cath lab and going through timeout procedure she was moved to the cath lab table. She very quickly got distressed lying back, trying to sit up. She was placed on bipap for respiratory distress. She suddenly went unresponsive during this time without ongoing agitation. No sedation was given. PCCM was consulted in cath lab for emergent management of respiratory failure.    Pertinent  Medical History  MD HTN HLD Breast cancer, s/p left mastectomy  Significant Hospital Events: Including procedures, antibiotic start and stop dates in addition to other pertinent events   5/1 Admitted  5/9 deteriorated due to flash pulmonary edema, intubated, IABP. 5/12 Extubated  5/13 Remains off vent, overnight needing levophed  gtt restarted but weaning off  5/14 plan for mitral valve replacement, still on IABP 1:1  5/15 mitral valve replacement, remains on vent post op. Extubated, IABP removed 5/16 hypertensive, required multiple doses of hydralazine .  Remain on milrinone  5/17 remain on milrinone  at 0.25, tachycardic  Interim History / Subjective:  Some nausea today, appetite is still poor. Remains on TF.  Afebrile, remains tachycardic.  Remains on milrinone , amiodarone , PCN G, Lasix  infusions.  Objective    Physical Exam   General: ill appearing elderly woman sitting up in the chair in NAD HEENT: /AT, eyes anicteric, cortrak Neck: LIJ CVC, RIJ introducer Neuro: awake, alert, moving all extremities Chest: reduced basilar breath sounds, moderate strength cough. CTA anteriorly. Heart: S1S2, tachycardic, reg rhythm, mitral click. Abdomen: obese, soft, NT  Coox 72%] Na 134 BUN 28 Creatinine 0.91 WBC 7.8  H/H 8.9/27.7 INR 2.6 CXR personally reviewed-mild pulmonary edema, likely atelectasis in left lower lobe  Chest tube output 60 cc in last 24 hours I/O -450 cc in last 24 hours; net +11 L for the admission  Resolved Hospital Problem list :  Acute encephalopathy due to hypercapnia Hyperkalemia, resolved> wonder if this was due to succinylcholine  at intubation Hypomagnesemia thrombocytopenia  Assessment & Plan:  Acute mitral valve infective endocarditis with severe mitral regurgitation status post mechanical mitral valve replacement Acute HFrEF with cardiogenic shock on milrinone  Therapeutic AC on warfarin for mechanical MV Sinus tachycardia vs A-flutter -post-op care per TCTS -warfarin for mechanical MV; dosing per pharmacy. Goal INR 2.5-3.5. -pain control per protocol -hopefully can d/c RIJ introducer today; con't LIJ CVC -con't milrinone  per AHF's recommendation -con't amiodarone ; not able to start Bblocker until off inotropes -d/w AHF, off hydralazine  now. -con't diuresis-- diamox , spironolactone . Increase lasix  gtt.  Group A streptococcus bacteremia  -PCN G per ID  Acute pulmonary edema due to hypertension, improved Acute respiratory failure with hypoxia and hypercapnia -ongoing diuresis -pulmonary hygiene -wean O2 as able to maintain SpO2 > 90%  Hypervolemic hyponatremia -diuresis -avoid hypotonic fluids  Poorly controlled diabetes with hyperglycemia-uncontrolled; getting large dextrose  load with PCN G -SSI PRN -glargine 30 units BID -adding  TF/ dextrose  infusion coverage with  aspart 3 units q4h -goal BG 140-180  Anemia due to critical illness, operative blood loss -no current indication for transfusions -monitor for bleeding  Morbid obesity -recommend modest weight loss as a long term goal  Nausea  -zofran  PRN; adding compazine  PRN as second line agent   Niece updated at bedside today.   Best Practice (right click and "Reselect all SmartList Selections" daily)   Diet/type: NPO tube feeds DVT prophylaxis Coumadin  Pressure ulcer(s): Deferred to nursing notes GI prophylaxis: PPI Lines: [employer], Aline, still needed. Remove RIJ introducer today.  PICC, then remove LIJ CVC Foley:  Yes, and it is still needed Code Status:  full code Last date of multidisciplinary goals of care discussion- 5/13   Melissa Mussel, DO 08/18/23 9:41 AM [facility]  For contact information, see Amion. If no response to pager, please call PCCM consult pager. After hours, 7PM- 7AM, please call Elink.

## 2023-08-18 NOTE — PMR Pre-admission (Shared)
 PMR Admission Coordinator Pre-Admission Assessment  Patient: Melissa Bray is an 63 y.o., female MRN: [id number] DOB: Apr 23, 1960 Height: 5\' 5"  (165.1 cm) Weight: 124.6 kg  Insurance Information HMO:     PPO:  yes    PCP:      IPA:      80/20:      OTHER:  PRIMARY: BCBS Commercial PPO      Policy#: [id number]       Subscriber: Pt CM Name: ***      Phone#: ***     Fax#: [phone] (PLEASE confirm for concurrent review!!) Pre-Cert#: [id number]      Employer:  Benefits:  Phone #:      Name:  Venson Daryle Date: 09/30/2022- 03/31/9998 Deductible: $7,500 ($6,281.99 met) OOP Max: $7,500 ($6,281.99 met) CIR: 100% coverage SNF: 100% coverage, limited to 120 days/cal yr (120 remaining) Outpatient: 100% coverage Home Health: 100% coverage; limited to 120 visits/cal yr (120 remaining) DME: 100% coverage Providers: In network   SECONDARY:       Policy#:      Phone#:    Artist:       Phone#:   The Data processing manager" for patients in Inpatient Rehabilitation Facilities with attached "Privacy Act Statement-Health Care Records" was provided and verbally reviewed with: n/a  Emergency Contact Information Contact Information     Name Relation Home Work [place] Son   [phone]      Other Contacts     Name Relation Home Work Mobile   Clarence Sister   [phone]   Bartolo Bors   [phone]       Current Medical History  Patient Admitting Diagnosis: Endocarditis, HF History of Present Illness: Melissa Bray is a 63 y.o. female with medical history significant for type 2 diabetes mellitus, essential hypertension, hyperlipidemia, breast cancer status post left mastectomy in 2016, who was admitted to [facility] on 07/31/2023 by way of transfer from [facility] with suspected severe sepsis due to left upper extremity cellulitis after presenting from home to the latter facility complaining of generalized weakness.  Imaging in the ED, per corresponding formal radiology read, was notable for the following: 2 view chest x-ray showed no evidence of acute cardiopulmonary process, including no evidence of infiltrate, edema, effusion, or pneumothorax. Vital signs in the ED were notable for the following: Temperature max 1-3.2; heart rates initially in the 1 teens, essentially decreasing into the 90s following IV fluids; initial blood pressure 95/65, with ensuing increase in systolic blood pressures into the 1 teens 120s following interval IV fluids; respiratory rate 19-24, oxygen saturation 94 to 98% on room air. Labs were notable for the following: CMP was notable for the following: Sodium 131, which corrected to approximately 134 when taking into account concomitant hyperglycemia, potassium 3.6, bicarbonate 20, anion gap 18, creatinine 1.11 compared to 0.8 in November 2024, glucose 288, liver enzymes were within normal limits.  Hemoglobin A1c 12.8%.  Initial lactic acid 1.4, with repeat value trending down to 1.3.  CBC notable for white blood cell count 9700 with 81% neutrophils.  INR 1.1.  Urinalysis showed 11-20 white blood cells, nitrate negative, small leukocyte esterase, along with 6-10 squamous cells, 100 protein and specific gravity of 1.025.  Blood cultures x 2 were collected prior to initiation of antibiotics.  COVID, influenza, RSV PCR were all negative. EKG in ED demonstrated the following: Sinus tachycardia with heart rate 106, normal intervals, no evidence of T wave changes, nonspecific less than 1  mm ST elevation in leads III, aVF, no evidence of greater than 1 mm ST elevation. She was transferred  07/31/23 to [facility]. During her evaluation she was diagnosed with GBS bacteremia and mitral valve endocarditis with leaflet perforation. She required dental extractions for dental carries. On 08/08/23, while in cath lab for LHC, Pt. deteriorated due to flash pulmonary edema, intubated, IABP. She was  intubated 08/08/23-08/11/23. Pt. Underwent Mitral valve replacement 08/12/23. Both cardiothoracic surgery and heart failure team continue to follow. She was placed on penicillin  G per infectious disease recommendations of 6 weeks initiated 08/13/2023 through 09/24/2023. Pt. Seen by PT/OT and they recommended CIR to assist return to PLOF.     Patient's medical record from [facility] has been reviewed by the rehabilitation admission coordinator and physician.  Past Medical History  Past Medical History:  Diagnosis Date   Anxiety    Breast cancer (HCC)    Diabetes mellitus without complication (HCC)    Hyperlipidemia    Hypertension    Type 2 diabetes mellitus without retinopathy (HCC) 06/17/2017    Has the patient had major surgery during 100 days prior to admission? Yes  Family History   family history includes Asthma in her brother and mother; Cancer in her brother and father; Diabetes in her father; Hypertension in her father.  Current Medications  Current Facility-Administered Medications:    acetaminophen  (TYLENOL ) tablet 1,000 mg, 1,000 mg, Oral, Q6H, 1,000 mg at 08/17/23 1127 **OR** acetaminophen  (TYLENOL ) 160 MG/5ML solution 1,000 mg, 1,000 mg, Per Tube, Q6H, Bensimhon, Daniel R, MD, 1,000 mg at 08/18/23 1204   acetaZOLAMIDE  (DIAMOX ) tablet 500 mg, 500 mg, Per Tube, BID, Bitonti, Michael T, RPH   [COMPLETED] amiodarone  (NEXTERONE ) 1.8 mg/mL load via infusion 150 mg, 150 mg, Intravenous, Once, 150 mg at 08/10/23 1258 **FOLLOWED BY** [EXPIRED] amiodarone  (NEXTERONE  PREMIX) 360-4.14 MG/200ML-% (1.8 mg/mL) IV infusion, 60 mg/hr, Intravenous, Continuous, Stopped at 08/10/23 1937 **FOLLOWED BY** amiodarone  (NEXTERONE  PREMIX) 360-4.14 MG/200ML-% (1.8 mg/mL) IV infusion, 30 mg/hr, Intravenous, Continuous, Gold, Wayne E, PA-C, Last Rate: 16.67 mL/hr at 08/18/23 1200, 30 mg/hr at 08/18/23 1200   anastrozole  (ARIMIDEX ) tablet 1 mg, 1 mg, Per Tube, Daily, Albino Alu, RPH, 1 mg at  08/18/23 1020   aspirin  chewable tablet 81 mg, 81 mg, Per Tube, Daily, Albino Alu, RPH, 81 mg at 08/18/23 1021   Chlorhexidine  Gluconate Cloth 2 % PADS 6 each, 6 each, Topical, Daily, [name], MD, 6 each at 08/18/23 1021   clonazePAM  (KLONOPIN ) tablet 1 mg, 1 mg, Per Tube, BID PRN, Desai, Rahul P, PA-C, 1 mg at 08/17/23 2307   dextrose  50 % solution 0-50 mL, 0-50 mL, Intravenous, PRN, Gold, Wayne E, PA-C   feeding supplement (JEVITY 1.5 CAL/FIBER) liquid 1,000 mL, 1,000 mL, Per Tube, Continuous, Desai, Rahul P, PA-C, Last Rate: 45 mL/hr at 08/18/23 1200, Infusion Verify at 08/18/23 1200   feeding supplement (PROSource TF20) liquid 60 mL, 60 mL, Per Tube, TID, Desai, Rahul P, PA-C, 60 mL at 08/18/23 1020   furosemide  (LASIX ) 200 mg in dextrose  5 % 100 mL (2 mg/mL) infusion, 20 mg/hr, Intravenous, Continuous, Lauralee Poll, MD, Last Rate: 10 mL/hr at 08/18/23 1200, 20 mg/hr at 08/18/23 1200   CBG monitoring, , , Q4H **AND** insulin  aspart (novoLOG ) injection 0-24 Units, 0-24 Units, Subcutaneous, Q4H, Bitonti, Michael T, RPH, 8 Units at 08/18/23 1204   insulin  aspart (novoLOG ) injection 3 Units, 3 Units, Subcutaneous, Q4H, Raliegh Burgess, RPH, 3 Units  at 08/18/23 1204   insulin  glargine-yfgn (SEMGLEE ) injection 35 Units, 35 Units, Subcutaneous, BID, Randa Burton, PA-C, 35 Units at 08/18/23 1038   milrinone  (PRIMACOR ) 20 MG/100 ML (0.2 mg/mL) infusion, 0.25 mcg/kg/min, Intravenous, Continuous, Gold, Wayne E, PA-C, Last Rate: 8.84 mL/hr at 08/18/23 1200, 0.25 mcg/kg/min at 08/18/23 1200   morphine  (PF) 2 MG/ML injection 1-4 mg, 1-4 mg, Intravenous, Q1H PRN, Gold, Wayne E, PA-C, 2 mg at 08/15/23 1313   ondansetron  (ZOFRAN ) injection 4 mg, 4 mg, Intravenous, Q6H PRN, Gold, Wayne E, PA-C, 4 mg at 08/17/23 1617   Oral care mouth rinse, 15 mL, Mouth Rinse, PRN, [name], MD   oxyCODONE  (Oxy IR/ROXICODONE ) immediate release tablet 5-10 mg, 5-10 mg, Per Tube, Q3H PRN,  Albino Alu, RPH, 10 mg at 08/17/23 2034   pantoprazole  (PROTONIX ) injection 40 mg, 40 mg, Intravenous, Daily, [name], MD, 40 mg at 08/18/23 1020   penicillin  G potassium 12 Million Units in dextrose  5 % 500 mL CONTINUOUS infusion, 12 Million Units, Intravenous, Q12H, Liane Redman, MD, Last Rate: 41.7 mL/hr at 08/18/23 1200, Infusion Verify at 08/18/23 1200   prochlorperazine  (COMPAZINE ) injection 10 mg, 10 mg, Intravenous, Q6H PRN, Joesph Mussel, DO   senna-docusate (Senokot-S) tablet 1 tablet, 1 tablet, Per Tube, QHS, Albino Alu, RPH, 1 tablet at 08/16/23 2121   sodium chloride  flush (NS) 0.9 % injection 10-40 mL, 10-40 mL, Intracatheter, Q12H, [name], MD   sodium chloride  flush (NS) 0.9 % injection 10-40 mL, 10-40 mL, Intracatheter, PRN, [name], MD   traMADol  (ULTRAM ) tablet 50-100 mg, 50-100 mg, Per Tube, Q4H PRN, Albino Alu, RPH   warfarin (COUMADIN ) tablet 1 mg, 1 mg, Per Tube, ONCE-1600, Sheron Dixons, [facility]   Warfarin - Pharmacist Dosing Inpatient, , Does not apply, q1600, Bitonti, Michael T, [facility], Given at 08/17/23 1601  Patients Current Diet:  Diet Order             Diet clear liquid Room service appropriate? Yes; Fluid consistency: Thin  Diet effective now                   Precautions / Restrictions Precautions Precautions: Sternal, Fall Precaution Booklet Issued: Yes (comment) Precaution/Restrictions Comments: Swan-ganz, chest tube, wound-vac, A-line, NG, [place], catheter Restrictions Weight Bearing Restrictions Per Provider Order: Yes RUE Weight Bearing Per Provider Order: Non weight bearing LUE Weight Bearing Per Provider Order: Non weight bearing RLE Weight Bearing Per Provider Order: Weight bearing as tolerated LLE Weight Bearing Per Provider Order: Weight bearing as tolerated Other Position/Activity Restrictions: sternal   Has the patient had 2 or more falls or a fall with injury in the past year?  No  Prior Activity Level Community (5-7x/wk): Pt. active in the community PTA  Prior Functional Level Self Care: Did the patient need help bathing, dressing, using the toilet or eating? Independent  Indoor Mobility: Did the patient need assistance with walking from room to room (with or without device)? Independent  Stairs: Did the patient need assistance with internal or external stairs (with or without device)? Independent  Functional Cognition: Did the patient need help planning regular tasks such as shopping or remembering to take medications? Independent  Patient Information Are you of Hispanic, Latino/a,or Spanish origin?: A. No, not of Hispanic, Latino/a, or Spanish origin What is your race?: B. Black or African American Do you need or want an interpreter to communicate with a doctor or health care staff?: 0. No  Patient's Response  To:  Health Literacy and Transportation Is the patient able to respond to health literacy and transportation needs?: Yes Health Literacy - How often do you need to have someone help you when you read instructions, pamphlets, or other written material from your doctor or pharmacy?: Never In the past 12 months, has lack of transportation kept you from medical appointments or from getting medications?: No In the past 12 months, has lack of transportation kept you from meetings, work, or from getting things needed for daily living?: No  Home Assistive Devices / Equipment Home Equipment: None  Prior Device Use: Indicate devices/aids used by the patient prior to current illness, exacerbation or injury? None of the above  Current Functional Level Cognition  Orientation Level: Oriented X4    Extremity Assessment (includes Sensation/Coordination)  Upper Extremity Assessment: Generalized weakness, Right hand dominant LUE Deficits / Details: edema noted LUE, shoulder/elbow/hand AROM WFL LUE Sensation: WNL LUE Coordination: WNL  Lower Extremity  Assessment: Defer to PT evaluation    ADLs  Overall ADL's : Needs assistance/impaired Eating/Feeding: Minimal assistance, Sitting Grooming: Wash/dry hands, Wash/dry face, Minimal assistance, Sitting Upper Body Bathing: Moderate assistance, Sitting Lower Body Bathing: Maximal assistance, Sitting/lateral leans, +2 for safety/equipment Upper Body Dressing : Maximal assistance, Sitting Lower Body Dressing: Maximal assistance, Sit to/from stand, Sitting/lateral leans, Total assistance, +2 for safety/equipment Toilet Transfer: Moderate assistance, +2 for physical assistance, Stand-pivot Toileting- Clothing Manipulation and Hygiene: Total assistance, Sit to/from stand General ADL Comments: education provided for ECTs and if interested in shower seat    Mobility  Overal bed mobility: Needs Assistance Bed Mobility: Supine to Sit Supine to sit: Mod assist, +2 for physical assistance, +2 for safety/equipment General bed mobility comments: Able to move LE's off EOB, ModAx2 with pt holding heart pillow to move into long-sitting. x3 for line management    Transfers  Overall transfer level: Needs assistance Equipment used: 2 person hand held assist Transfers: Sit to/from Stand, Bed to chair/wheelchair/BSC Sit to Stand: Mod assist, +2 physical assistance, +2 safety/equipment Bed to/from chair/wheelchair/BSC transfer type:: Step pivot Step pivot transfers: Mod assist, +2 physical assistance, +2 safety/equipment General transfer comment: pt holding heart pillow, ModAx2 for boost-up and steadying. Able to take short and choppy steps towards reclined with difficulty stepping with LLE. x3 for line management    Ambulation / Gait / Stairs / Wheelchair Mobility  Ambulation/Gait Ambulation/Gait assistance: Printmaker (Feet): 280 Feet Assistive device: None Gait Pattern/deviations: WFL(Within Functional Limits) General Gait Details: deferred 2/2 safety and swan-ganz Gait velocity: WNL     Posture / Balance Balance Overall balance assessment: Mild deficits observed, not formally tested, Needs assistance Sitting-balance support: No upper extremity supported, Feet supported Sitting balance-Leahy Scale: Fair Standing balance support: Bilateral upper extremity supported, During functional activity Standing balance-Leahy Scale: Poor Standing balance comment: reliant on external support    Special needs/care consideration Skin surgical incision   Previous Home Environment (from acute therapy documentation) Living Arrangements: Children  Lives With: Son Available Help at Discharge: Family, Available 24 hours/day Type of Home: House Home Layout: One level Home Access: Stairs to enter Entrance Stairs-Rails: None Entrance Stairs-Number of Steps: 2 Bathroom Shower/Tub: Hydrographic surveyor, Engineer, building services: Pharmacist, community: Yes Home Care Services: No Additional Comments: 24/7 between son and caretaker  Discharge Living Setting Plans for Discharge Living Setting: Patient's home Type of Home at Discharge: House Discharge Home Layout: One level Discharge Home Access: Stairs to enter Entrance Stairs-Rails: None Entrance Stairs-Number of Steps: 2 Discharge Bathroom  Shower/Tub: Tub/shower unit Discharge Bathroom Toilet: Standard Discharge Bathroom Accessibility: No Does the patient have any problems obtaining your medications?: No  Social/Family/Support Systems Patient Roles: Other (Comment) Contact Information: [phone] Anticipated Caregiver: Bambi Lever (son) Anticipated Caregiver's Contact Information: Lives with son, who works days. Has a caretaker while son works Medical laboratory scientific officer: 24/7 Discharge Plan Discussed with Primary Caregiver: Yes Is Caregiver In Agreement with Plan?: No Does Caregiver/Family have Issues with Lodging/Transportation while Pt is in Rehab?: Yes  Goals Patient/Family Goal for Rehab: PT/OT/SLP Min A Expected length of  stay: 15-17 days Pt/Family Agrees to Admission and willing to participate: Yes Program Orientation Provided & Reviewed with Pt/Caregiver Including Roles  & Responsibilities: Yes  Decrease burden of Care through IP rehab admission: not anticipated  Possible need for SNF placement upon discharge: Not anticipated  Patient Condition: I have reviewed medical records from [facility] , spoken with {CHL IP CSW WG:[id number]}, and patient and son. I met with patient at the bedside for inpatient rehabilitation assessment.  Patient will benefit from ongoing PT, OT, and SLP, can actively participate in 3 hours of therapy a day 5 days of the week, and can make measurable gains during the admission.  Patient will also benefit from the coordinated team approach during an Inpatient Acute Rehabilitation admission.  The patient will receive intensive therapy as well as Rehabilitation physician, nursing, social worker, and care management interventions.  Due to safety, skin/wound care, disease management, medication administration, pain management, and patient education the patient requires 24 hour a day rehabilitation nursing.  The patient is currently *** with mobility and basic ADLs.  Discharge setting and therapy post discharge at home with home health is anticipated.  Patient has agreed to participate in the Acute Inpatient Rehabilitation Program and will admit {Time; today/tomorrow:10263}.  Preadmission Screen Completed By:  Dorena Gander, 08/18/2023 1:09 PM ______________________________________________________________________   Discussed status with Dr. [name] on *** at *** and received approval for admission today.  Admission Coordinator:  Dorena Gander, CCC-SLP, time Aaron AasAlanna Hu ***   Assessment/Plan: Diagnosis: *** Does the need for close, 24 hr/day Medical supervision in concert with the patient's rehab needs make it unreasonable for this patient to be served in a less intensive setting?  {yes_no_potentially:3041433} Co-Morbidities requiring supervision/potential complications: *** Due to {due VH:8469629}, does the patient require 24 hr/day rehab nursing? {yes_no_potentially:3041433} Does the patient require coordinated care of a physician, rehab nurse, PT, OT, and SLP to address physical and functional deficits in the context of the above medical diagnosis(es)? {yes_no_potentially:3041433} Addressing deficits in the following areas: {deficits:3041436} Can the patient actively participate in an intensive therapy program of at least 3 hrs of therapy 5 days a week? {yes_no_potentially:3041433} The potential for patient to make measurable gains while on inpatient rehab is {potential:3041437} Anticipated functional outcomes upon discharge from inpatient rehab: {functional outcomes:304600100} PT, {functional outcomes:304600100} OT, {functional outcomes:304600100} SLP Estimated rehab length of stay to reach the above functional goals is: *** Anticipated discharge destination: {anticipated dc setting:21604} 10. Overall Rehab/Functional Prognosis: {potential:3041437}   MD Signature: ***

## 2023-08-18 NOTE — Plan of Care (Signed)
 Problem: Education: Goal: Knowledge of General Education information will improve Description: Including pain rating scale, medication(s)/side effects and non-pharmacologic comfort measures Outcome: Progressing   Problem: Health Behavior/Discharge Planning: Goal: Ability to manage health-related needs will improve Outcome: Progressing   Problem: Clinical Measurements: Goal: Ability to maintain clinical measurements within normal limits will improve Outcome: Progressing Goal: Will remain free from infection Outcome: Progressing Goal: Diagnostic test results will improve Outcome: Progressing Goal: Respiratory complications will improve Outcome: Progressing Goal: Cardiovascular complication will be avoided Outcome: Progressing   Problem: Activity: Goal: Risk for activity intolerance will decrease Outcome: Progressing   Problem: Nutrition: Goal: Adequate nutrition will be maintained Outcome: Progressing   Problem: Coping: Goal: Level of anxiety will decrease Outcome: Progressing   Problem: Elimination: Goal: Will not experience complications related to bowel motility Outcome: Progressing Goal: Will not experience complications related to urinary retention Outcome: Progressing   Problem: Pain Managment: Goal: General experience of comfort will improve and/or be controlled Outcome: Progressing   Problem: Safety: Goal: Ability to remain free from injury will improve Outcome: Progressing   Problem: Skin Integrity: Goal: Risk for impaired skin integrity will decrease Outcome: Progressing   Problem: Education: Goal: Ability to describe self-care measures that may prevent or decrease complications (Diabetes Survival Skills Education) will improve Outcome: Progressing Goal: Individualized Educational Video(s) Outcome: Progressing   Problem: Coping: Goal: Ability to adjust to condition or change in health will improve Outcome: Progressing   Problem: Fluid  Volume: Goal: Ability to maintain a balanced intake and output will improve Outcome: Progressing   Problem: Health Behavior/Discharge Planning: Goal: Ability to identify and utilize available resources and services will improve Outcome: Progressing Goal: Ability to manage health-related needs will improve Outcome: Progressing   Problem: Metabolic: Goal: Ability to maintain appropriate glucose levels will improve Outcome: Progressing   Problem: Nutritional: Goal: Maintenance of adequate nutrition will improve Outcome: Progressing Goal: Progress toward achieving an optimal weight will improve Outcome: Progressing   Problem: Skin Integrity: Goal: Risk for impaired skin integrity will decrease Outcome: Progressing   Problem: Tissue Perfusion: Goal: Adequacy of tissue perfusion will improve Outcome: Progressing   Problem: Fluid Volume: Goal: Hemodynamic stability will improve Outcome: Progressing   Problem: Clinical Measurements: Goal: Signs and symptoms of infection will decrease Outcome: Progressing   Problem: Clinical Measurements: Goal: Ability to avoid or minimize complications of infection will improve Outcome: Progressing   Problem: Skin Integrity: Goal: Skin integrity will improve Outcome: Progressing   Problem: Education: Goal: Understanding of CV disease, CV risk reduction, and recovery process will improve Outcome: Progressing Goal: Individualized Educational Video(s) Outcome: Progressing   Problem: Activity: Goal: Ability to return to baseline activity level will improve Outcome: Progressing   Problem: Cardiovascular: Goal: Ability to achieve and maintain adequate cardiovascular perfusion will improve Outcome: Progressing Goal: Vascular access site(s) Level 0-1 will be maintained Outcome: Progressing   Problem: Health Behavior/Discharge Planning: Goal: Ability to safely manage health-related needs after discharge will improve Outcome: Progressing    Problem: Activity: Goal: Ability to tolerate increased activity will improve Outcome: Progressing   Problem: Respiratory: Goal: Ability to maintain a clear airway and adequate ventilation will improve Outcome: Progressing   Problem: Role Relationship: Goal: Method of communication will improve Outcome: Progressing   Problem: Cardiac: Goal: Ability to achieve and maintain adequate cardiopulmonary perfusion will improve Outcome: Progressing Goal: Vascular access site(s) Level 0-1 will be maintained Outcome: Progressing   Problem: Fluid Volume: Goal: Ability to achieve a balanced intake and output will improve Outcome: Progressing  Problem: Physical Regulation: Goal: Complications related to the disease process, condition or treatment will be avoided or minimized Outcome: Progressing

## 2023-08-18 NOTE — Plan of Care (Signed)
 Cross Cover Note   SPB 70s-80s overnight, most recently 72/44. Patient is sleeping, calm now, but previously agitated required prn clonezepam. Otherwise asymptomatic. Good urine output.  Plan: -lactic acid now, if normal, no changes overnight -if rising or elevated, will likely start low dose NE   Avonne Boettcher, MD Cardiology

## 2023-08-18 NOTE — TOC Progression Note (Addendum)
 Transition of Care [facility]) - Progression Note    Patient Details  Name: Melissa Bray MRN: [id number] Date of Birth: March 16, 1961  Transition of Care [facility]) CM/SW Contact  Benjiman Bras, RN Phone Number: [phone] 08/18/2023, 4:15 PM  Clinical Narrative:     TOC CM spoke to pt and son at bedside. Offered choice for HH/Home IV abx. Pt states her plan is for Home with Home Health. But would like IP rehab prior to going home.   Contacted [employer] rep, [name] with new referral. CIR following for IP rehab. Son inquired about FMLA/STD paperwork. Will reach out to team to find out if it has been completed.   LAURIE B. @ [facility] : CONTACT FOR D/C PLANNING NEEDS. PHONE # [phone] X [phone].  Expected Discharge Plan: IP Rehab Facility Barriers to Discharge: Continued Medical Work up  Expected Discharge Plan and Services   Discharge Planning Services: CM Consult Post Acute Care Choice: IP Rehab Living arrangements for the past 2 months: Single Family Home                             HH Agency: [employer] Date HH Agency Contacted: 08/18/23 Time HH Agency Contacted: [phone] Representative spoke with at [facility] Agency: Kay Parson RN, [employer] rep   Social Determinants of Health (SDOH) Interventions SDOH Screenings   Food Insecurity: No Food Insecurity (07/31/2023)  Housing: Low Risk  (07/31/2023)  Transportation Needs: No Transportation Needs (07/31/2023)  Utilities: Not At Risk (07/31/2023)  Depression (PHQ2-9): Low Risk  (06/27/2023)  Tobacco Use: Low Risk  (08/08/2023)    Readmission Risk Interventions     No data to display

## 2023-08-19 DIAGNOSIS — I059 Rheumatic mitral valve disease, unspecified: Secondary | ICD-10-CM | POA: Diagnosis not present

## 2023-08-19 DIAGNOSIS — J9602 Acute respiratory failure with hypercapnia: Secondary | ICD-10-CM | POA: Diagnosis not present

## 2023-08-19 DIAGNOSIS — I340 Nonrheumatic mitral (valve) insufficiency: Secondary | ICD-10-CM | POA: Diagnosis not present

## 2023-08-19 DIAGNOSIS — J9601 Acute respiratory failure with hypoxia: Secondary | ICD-10-CM | POA: Diagnosis not present

## 2023-08-19 DIAGNOSIS — J810 Acute pulmonary edema: Secondary | ICD-10-CM | POA: Diagnosis not present

## 2023-08-19 DIAGNOSIS — R570 Cardiogenic shock: Secondary | ICD-10-CM | POA: Diagnosis not present

## 2023-08-19 LAB — GLUCOSE, CAPILLARY
Glucose-Capillary: 103 mg/dL — ABNORMAL HIGH (ref 70–99)
Glucose-Capillary: 146 mg/dL — ABNORMAL HIGH (ref 70–99)
Glucose-Capillary: 152 mg/dL — ABNORMAL HIGH (ref 70–99)
Glucose-Capillary: 170 mg/dL — ABNORMAL HIGH (ref 70–99)
Glucose-Capillary: 198 mg/dL — ABNORMAL HIGH (ref 70–99)
Glucose-Capillary: 242 mg/dL — ABNORMAL HIGH (ref 70–99)
Glucose-Capillary: 269 mg/dL — ABNORMAL HIGH (ref 70–99)

## 2023-08-19 LAB — BASIC METABOLIC PANEL WITH GFR
Anion gap: 10 (ref 5–15)
BUN: 25 mg/dL — ABNORMAL HIGH (ref 8–23)
CO2: 27 mmol/L (ref 22–32)
Calcium: 8.6 mg/dL — ABNORMAL LOW (ref 8.9–10.3)
Chloride: 97 mmol/L — ABNORMAL LOW (ref 98–111)
Creatinine, Ser: 0.96 mg/dL (ref 0.44–1.00)
GFR, Estimated: >60 mL/min (ref >60)
Glucose, Bld: 169 mg/dL — ABNORMAL HIGH (ref 70–99)
Potassium: 3.6 mmol/L (ref 3.5–5.1)
Sodium: 134 mmol/L — ABNORMAL LOW (ref 135–145)

## 2023-08-19 LAB — PROTIME-INR
INR: 2.1 — ABNORMAL HIGH (ref 0.8–1.2)
Prothrombin Time: 23.6 s — ABNORMAL HIGH (ref 11.4–15.2)

## 2023-08-19 LAB — COOXEMETRY PANEL
Carboxyhemoglobin: 1.3 % (ref 0.5–1.5)
Methemoglobin: <0.7 % (ref 0.0–1.5)
O2 Saturation: 72.9 %
Total hemoglobin: 10.6 g/dL — ABNORMAL LOW (ref 12.0–16.0)

## 2023-08-19 LAB — PHOSPHORUS: Phosphorus: 3.8 mg/dL (ref 2.5–4.6)

## 2023-08-19 LAB — MAGNESIUM: Magnesium: 1.6 mg/dL — ABNORMAL LOW (ref 1.7–2.4)

## 2023-08-19 MED ORDER — ACETAZOLAMIDE 250 MG PO TABS
500.0000 mg | ORAL_TABLET | Freq: Two times a day (BID) | ORAL | Status: AC
  Administered 2023-08-19 (×2): 500 mg via ORAL
  Filled 2023-08-19 (×2): qty 2

## 2023-08-19 MED ORDER — ACETAZOLAMIDE 250 MG PO TABS: 500.0000 mg | ORAL_TABLET | Freq: Two times a day (BID) | ORAL | Status: DC

## 2023-08-19 MED ORDER — MAGNESIUM SULFATE 4 GM/100ML IV SOLN
4.0000 g | Freq: Once | INTRAVENOUS | Status: AC
  Administered 2023-08-19: 4 g via INTRAVENOUS
  Filled 2023-08-19: qty 100

## 2023-08-19 MED ORDER — WARFARIN SODIUM 3 MG PO TABS
3.0000 mg | ORAL_TABLET | Freq: Once | ORAL | Status: AC
  Administered 2023-08-19: 3 mg
  Filled 2023-08-19: qty 1

## 2023-08-19 MED ORDER — POTASSIUM CHLORIDE 20 MEQ PO PACK
40.0000 meq | PACK | ORAL | Status: AC
  Administered 2023-08-19 (×2): 40 meq
  Filled 2023-08-19 (×2): qty 2

## 2023-08-19 MED ORDER — POTASSIUM CHLORIDE 20 MEQ PO PACK: 20.0000 meq | PACK | ORAL | Status: DC

## 2023-08-19 MED ORDER — SPIRONOLACTONE 12.5 MG HALF TABLET
12.5000 mg | ORAL_TABLET | Freq: Every day | ORAL | Status: DC
  Administered 2023-08-19: 12.5 mg
  Filled 2023-08-19 (×2): qty 1

## 2023-08-19 MED ORDER — BANATROL TF EN LIQD
60.0000 mL | Freq: Four times a day (QID) | ENTERAL | Status: DC
  Administered 2023-08-19 – 2023-08-24 (×17): 60 mL
  Filled 2023-08-19 (×13): qty 60

## 2023-08-19 NOTE — Progress Notes (Signed)
 PHARMACY - ANTICOAGULATION CONSULT NOTE  Pharmacy Consult for warfarin Indication: onX mMVR  No Known Allergies  Patient Measurements: Height: 5\' 5"  (165.1 cm) Weight: 120.1 kg (264 lb 12.4 oz) IBW/kg (Calculated) : 57 HEPARIN  DW (KG): 84.3  Vital Signs: Temp: 97.8 F (36.6 C) (05/20 0000) Temp Source: Oral (05/20 0000) BP: 80/60 (05/20 0700) Pulse Rate: 132 (05/20 0600)  Labs: Recent Labs    08/17/23 0413 08/18/23 0430 08/18/23 1330 08/19/23 0426  HGB  --  8.9*  --   --   HCT  --  27.7*  --   --   PLT  --  218  --   --   LABPROT 17.4* 28.1*  --  23.6*  INR 1.4* 2.6*  --  2.1*  CREATININE 0.90 0.91 0.97 0.96    Estimated Creatinine Clearance: 76.8 mL/min (by C-G formula based on SCr of 0.96 mg/dL).   Medical History: Past Medical History:  Diagnosis Date   Anxiety    Breast cancer (HCC)    Diabetes mellitus without complication (HCC)    Hyperlipidemia    Hypertension    Type 2 diabetes mellitus without retinopathy (HCC) 06/17/2017    Medications:  Scheduled:   anastrozole   1 mg Per Tube Daily   aspirin   81 mg Per Tube Daily   Chlorhexidine  Gluconate Cloth  6 each Topical Daily   feeding supplement (PROSource TF20)  60 mL Per Tube Daily   fiber supplement (BANATROL TF)  60 mL Per Tube BID   insulin  aspart  0-24 Units Subcutaneous Q4H   insulin  aspart  3 Units Subcutaneous Q4H   insulin  glargine-yfgn  35 Units Subcutaneous BID   pantoprazole  (PROTONIX ) IV  40 mg Intravenous Daily   potassium chloride   40 mEq Per Tube Q4H   sodium chloride  flush  10-40 mL Intracatheter Q12H   Warfarin - Pharmacist Dosing Inpatient   Does not apply q1600    Assessment: 42 yof presenting with acute mitral valve endocarditis with perforated mitral leaflet and severe MR. Underwent dental extraction 5/8. Presented for pre-op RHC and developed flash pulmonary edema requiring intubation - IABP was placed in lab and IV heparin  started. No AC PTA.  Pt now s/p cardiac surgery  with onX mMVR placed. Pharmacy consulted to start warfarin 5/16 after IABP removal 5/15. INR today is down to 2.1, gave reduced dose 5/19 given rapid INR bump.  Goal of Therapy:  INR 2.5-3.5 Monitor platelets by anticoagulation protocol: Yes   Plan:  Warfarin 3mg  x1 tonight Daily INR  Levin Reamer, PharmD, BCPS, [facility] Clinical Pharmacist [phone] Please check AMION for all [facility] Pharmacy numbers 08/19/2023

## 2023-08-19 NOTE — Progress Notes (Addendum)
 Inpatient Rehab Admissions Coordinator:    CIR following. She is not medically ready at this time, remains on amio gtt, Will follow for potential admit once medically stable. Son confirmed that pt. Has 24/7 support at d/c between him and her aide.   Wandalee Gust, MS, CCC-SLP Rehab Admissions Coordinator  [phone] (celll) [phone] (office)

## 2023-08-19 NOTE — Progress Notes (Signed)
 Patient ID: Melissa Bray, female   DOB: May 28, 1960, 63 y.o.   MRN: [id number]     Advanced Heart Failure Rounding Note  Cardiologist: Eilleen Grates, MD   Chief Complaint: CHF  Subjective:    63 y/o obese woman admitted with cardiogenic shock in setting of severe MR due to endocarditis and perforation of anterior MV leaflet. Underwent mMVR on 08/14/23. Post-op course c/b RV Failure  POD#6 MVR  Remains on milrinone  0.25. Co-ox 73%.   Lasix  gtt at 20/hr. 6.2L in UOP yesterday, net negative 2.8L for the day. Wt down 10 lb. CVPs improving.   SCr 0.96. K 3.6, Mg 1.6  Remains in atrial flutter 130s. On amio gtt  Sleeping in chair. Feels tired. No current resting dyspnea.   Objective:    Weight Range: 120.1 kg Body mass index is 44.06 kg/m.   Vital Signs:   Temp:  [97.4 F (36.3 C)-98.9 F (37.2 C)] 97.8 F (36.6 C) (05/20 0000) Pulse Rate:  [126-133] 132 (05/20 0600) Resp:  [12-37] 22 (05/20 0700) BP: (80-123)/(58-89) 80/60 (05/20 0700) SpO2:  [94 %-100 %] 98 % (05/20 0600) Arterial Line BP: (61-126)/(47-81) 90/62 (05/20 0700) Weight:  [120.1 kg] 120.1 kg (05/20 0500) Last BM Date : 08/18/23  Weight change: Filed Weights   08/17/23 0500 08/18/23 0500 08/19/23 0500  Weight: 125.6 kg 124.6 kg 120.1 kg   Intake/Output:  Intake/Output Summary (Last 24 hours) at 08/19/2023 0808 Last data filed at 08/19/2023 0700 Gross per 24 hour  Intake 3112.25 ml  Output 6005 ml  Net -2892.75 ml    Physical Exam   General:  obese, fatigue appearing. No respiratory difficulty HEENT: normal Neck: supple. JVD elevated. Carotids 2+ bilat; no bruits. No lymphadenopathy or thyromegaly appreciated. Cor: PMI nondisplaced. Irregularly irregular rhythm. No rubs, gallops or murmurs. Lungs: decreased BS at the bases  Abdomen: soft, nontender, nondistended.  Extremities: no cyanosis, clubbing, rash, 1+ b/ LE edema, + RUE PICC  Neuro: alert & oriented x 3, cranial nerves grossly intact. moves  all 4 extremities w/o difficulty. Affect flat   Telemetry   AFL 120s-130s  Labs    CBC Recent Labs    08/18/23 0430  WBC 7.8  HGB 8.9*  HCT 27.7*  MCV 89.6  PLT 218   Basic Metabolic Panel Recent Labs    40/98/11 1848 08/17/23 0413 08/17/23 2026 08/18/23 1330 08/19/23 0426  NA  --  133*   < > 133* 134*  K  --  3.7   < > 3.7 3.6  CL  --  102   < > 97* 97*  CO2  --  23   < > 26 27  GLUCOSE  --  210*   < > 262* 169*  BUN  --  25*   < > 27* 25*  CREATININE  --  0.90   < > 0.97 0.96  CALCIUM   --  8.7*   < > 8.3* 8.6*  MG 1.9 1.9  --   --  1.6*  PHOS 2.0* 2.6  --   --   --    < > = values in this interval not displayed.   Imaging   US  EKG SITE RITE Result Date: 08/18/2023 If Site Rite image not attached, placement could not be confirmed due to current cardiac rhythm.    Medications:    Scheduled Medications:  anastrozole   1 mg Per Tube Daily   aspirin   81 mg Per Tube Daily   Chlorhexidine  Gluconate Cloth  6 each Topical Daily   feeding supplement (PROSource TF20)  60 mL Per Tube Daily   fiber supplement (BANATROL TF)  60 mL Per Tube BID   insulin  aspart  0-24 Units Subcutaneous Q4H   insulin  aspart  3 Units Subcutaneous Q4H   insulin  glargine-yfgn  35 Units Subcutaneous BID   pantoprazole  (PROTONIX ) IV  40 mg Intravenous Daily   potassium chloride   40 mEq Per Tube Q4H   sodium chloride  flush  10-40 mL Intracatheter Q12H   warfarin  3 mg Per Tube ONCE-1600   Warfarin - Pharmacist Dosing Inpatient   Does not apply q1600   Infusions:  amiodarone  30 mg/hr (08/19/23 0600)   feeding supplement (VITAL AF 1.2 CAL) 60 mL/hr at 08/19/23 0600   furosemide  (LASIX ) 200 mg in dextrose  5 % 100 mL (2 mg/mL) infusion 20 mg/hr (08/19/23 0600)   magnesium  sulfate bolus IVPB     milrinone  0.25 mcg/kg/min (08/19/23 0600)   penicillin  G potassium 12 Million Units in dextrose  5 % 500 mL CONTINUOUS infusion 41.7 mL/hr at 08/19/23 0600   PRN Medications: clonazePAM , dextrose ,  morphine  injection, ondansetron  (ZOFRAN ) IV, mouth rinse, oxyCODONE , prochlorperazine , senna-docusate, sodium chloride  flush, traMADol   Assessment/Plan   1. Cardiogenic shock: Valvular shock from severe MR.  TEE on 5/5 showed LV EF 65%, normal RV function, severe MR with perforated leaflet and mobile vegetation.  Patient developed flash pulmonary edema 5/9 in setting of marked hypertension -> IABP placed. - s/p mMVR 08/14/23 - POCUS ECHO 5/15 EF 60% RV mod reduced - Remains on Milrinone  0.25. Co-ox 73%.  - Diuresing well w/ Lasix  gtt and Diamox , volume improving but still volume overloaded. Continue Lasix  gtt at 20/hr. Repeat PO diamox  500 mg bid  - Wean milrinone  down to 0.125 mcg/kg/min   2. Mitral regurgitation: Severe on TEE with perforated anterior leaflet and mitral valve vegetation.  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  - s/p MVR with Dr. Luna Salinas 08/14/23 - post-op antibiotics per ID. On PenG, will require 6 weeks IV abx (start date 05/14). - INR 2.1. Warfarin / Lovenox  bridging per CT surgery   3. Acute hypercarbic/hypoxemic respiratory failure in setting of #1&2 - Resolved - Encouraged IS   4. Atrial flutter with RVR - Continue IV amiodarone  at 30/hr - Will likely need eventual DCCV after diuresis if she does not convert chemically, after further diuresis. Likely end of wk  - Warfarin as above  5. Morbid obesity - Body mass index is 44.06 kg/m.  6. Hyponatremia - restrict free water - continue diuresis  7. Hypokalemia/hypomag - supp with diuresis   Length of Stay: [address], PA-C  08/19/2023, 8:08 AM  Advanced Heart Failure Team Pager [phone] (M-F; 7a - 5p)  Please contact [facility] for night-coverage after hours (5p -7a ) and weekends on [email or web address]

## 2023-08-19 NOTE — Progress Notes (Signed)
 Physical Therapy Treatment Patient Details Name: Melissa Bray MRN: [id number] DOB: June 30, 1960 Today's Date: 08/19/2023   History of Present Illness 63 y.o. [facility] from [place]. Originally diagnosed with sepsis 2/2 LUE cellulitis with transfer to [facility] after murmur found. Pt with severe mitral regurgitation 2/2 endocarditis. 5/5 TEE. 5/8 extraction of teeth. 5/9 quick deterioration 2/2 flash pulmonary edema and cardiogenic shock before LHC was performed. 5/9-5/12 intubated. 5/9-5/15 IABP. 5/13 MVR. PMHx: T2DM, HTN, obesity, GAD, sinus tachycardia    PT Comments  Pt received sitting up in chair with request to return to bed due to bottom being in pain from rectal tube. Pt able to ambulate about 10' this date with use of eva walker and assist of 2 people prior to needing to sit. Today was pt's first time taking steps and tolerated well. Acute PT to cont to follow.   If plan is discharge home, recommend the following: A lot of help with walking and/or transfers;A lot of help with bathing/dressing/bathroom;Assistance with cooking/housework;Assist for transportation;Help with stairs or ramp for entrance   Can travel by private vehicle        Equipment Recommendations  Rolling walker (2 wheels);BSC/3in1    Recommendations for Other Services Rehab consult     Precautions / Restrictions Precautions Precautions: Sternal;Fall Precaution Booklet Issued: Yes (comment) Recall of Precautions/Restrictions: Impaired Precaution/Restrictions Comments: wound-vac, A-line,  Dimmitt, catheter Restrictions Weight Bearing Restrictions Per Provider Order: No Other Position/Activity Restrictions: sternal     Mobility  Bed Mobility Overal bed mobility: Needs Assistance Bed Mobility: Sit to Supine     Supine to sit: Mod assist, +2 for physical assistance, +2 for safety/equipment     General bed mobility comments: held onto heart pillow, max multimodal directional verbal cues to  return to supine, modA for trunk and LE management back into bed    Transfers Overall transfer level: Needs assistance Equipment used: 2 person hand held assist Transfers: Sit to/from Stand, Bed to chair/wheelchair/BSC Sit to Stand: +2 physical assistance, +2 safety/equipment, Min assist           General transfer comment: pt holding heart pillow, MminAx2 for boost-up and steadying.    Ambulation/Gait Ambulation/Gait assistance: Mod assist, +2 safety/equipment Gait Distance (Feet): 10 Feet Assistive device: Blanca Bunch Gait Pattern/deviations: Step-through pattern, Decreased stride length       General Gait Details: short step height and length, modA for walker management and line management, + fatigue but ambulated well   Stairs             Wheelchair Mobility     Tilt Bed    Modified Rankin (Stroke Patients Only)       Balance   Sitting-balance support: No upper extremity supported, Feet supported Sitting balance-Leahy Scale: Fair     Standing balance support: Bilateral upper extremity supported, During functional activity Standing balance-Leahy Scale: Poor Standing balance comment: reliant on external support                            Communication Communication Communication: Impaired Factors Affecting Communication: Reduced clarity of speech  Cognition Arousal: Lethargic Behavior During Therapy: Flat affect   PT - Cognitive impairments: No apparent impairments                       PT - Cognition Comments: A&Ox4, soft spoken, noted fatigue but voiced appreciation of therapy Following commands: Impaired Following commands impaired: Only  follows one step commands consistently    Cueing Cueing Techniques: Verbal cues, Tactile cues  Exercises      General Comments General comments (skin integrity, edema, etc.): VSS, has rectal tube      Pertinent Vitals/Pain Pain Assessment Pain Assessment: Faces Faces Pain Scale:  Hurts a little bit Pain Location: chest Pain Descriptors / Indicators: Aching, Discomfort    Home Living                          Prior Function            PT Goals (current goals can now be found in the care plan section) Acute Rehab PT Goals Patient Stated Goal: to get better PT Goal Formulation: With patient Time For Goal Achievement: 08/31/23 Potential to Achieve Goals: Good Progress towards PT goals: Progressing toward goals    Frequency    Min 2X/week      PT Plan      Co-evaluation              AM-PAC PT "6 Clicks" Mobility   Outcome Measure  Help needed turning from your back to your side while in a flat bed without using bedrails?: A Lot Help needed moving from lying on your back to sitting on the side of a flat bed without using bedrails?: A Lot Help needed moving to and from a bed to a chair (including a wheelchair)?: A Lot Help needed standing up from a chair using your arms (e.g., wheelchair or bedside chair)?: A Lot Help needed to walk in hospital room?: A Lot Help needed climbing 3-5 steps with a railing? : Total 6 Click Score: 11    End of Session Equipment Utilized During Treatment: Oxygen Activity Tolerance: Patient tolerated treatment well Patient left: with call bell/phone within reach;with nursing/sitter in room;in bed Nurse Communication: Mobility status (RN present during session) PT Visit Diagnosis: Unsteadiness on feet (R26.81);Other abnormalities of gait and mobility (R26.89);Muscle weakness (generalized) (M62.81)     Time: 1610-9604 PT Time Calculation (min) (ACUTE ONLY): 24 min  Charges:    $Gait Training: 8-22 mins $Therapeutic Activity: 8-22 mins PT General Charges $$ ACUTE PT VISIT: 1 Visit                     Renaee Caro, PT, DPT Acute Rehabilitation Services Secure chat preferred Office #: [phone]    Jenna Moan 08/19/2023, 2:40 PM

## 2023-08-19 NOTE — Progress Notes (Addendum)
 T[facility] DAILY ICU PROGRESS NOTE                   [address]            [place] [address]          [phone]   6 Days Post-Op Procedure(s) (LRB): MITRAL VALVE REPLACEMENT USING ON-X PROSTHETIC MITRAL HEART VALVE SIZE 31/33MM (N/A) ECHOCARDIOGRAM, TRANSESOPHAGEAL, INTRAOPERATIVE (N/A) CLIPPING, LEFT ATRIAL APPENDAGE USING ATRICURE LAA EXCLUSION SYSTEM SIZE 40  Total Length of Stay:  LOS: 17 days   Subjective: Somewhat lethargic but does answer questions appropriatly  Objective: Vital signs in last 24 hours: Temp:  [97.4 F (36.3 C)-98.9 F (37.2 C)] 97.8 F (36.6 C) (05/20 0000) Pulse Rate:  [126-133] 132 (05/20 0600) Cardiac Rhythm: Sinus tachycardia (05/19 2000) Resp:  [11-37] 22 (05/20 0700) BP: (80-123)/(58-89) 80/60 (05/20 0700) SpO2:  [94 %-100 %] 98 % (05/20 0600) Arterial Line BP: (61-126)/(47-81) 90/62 (05/20 0700) Weight:  [120.1 kg] 120.1 kg (05/20 0500)  Filed Weights   08/17/23 0500 08/18/23 0500 08/19/23 0500  Weight: 125.6 kg 124.6 kg 120.1 kg    Weight change: -4.5 kg   Hemodynamic parameters for last 24 hours: CVP:  [8 mmHg-19 mmHg] 15 mmHg  Intake/Output from previous day: 05/19 0701 - 05/20 0700 In: 3524.9 [I.V.:825.8; BJ/YN:8295.6; IV Piggyback:1000.8] Out: 6205 [Urine:6205]  Intake/Output this shift: No intake/output data recorded.  Current Meds: Scheduled Meds:  anastrozole   1 mg Per Tube Daily   aspirin   81 mg Per Tube Daily   Chlorhexidine  Gluconate Cloth  6 each Topical Daily   feeding supplement (PROSource TF20)  60 mL Per Tube Daily   fiber supplement (BANATROL TF)  60 mL Per Tube BID   insulin  aspart  0-24 Units Subcutaneous Q4H   insulin  aspart  3 Units Subcutaneous Q4H   insulin  glargine-yfgn  35 Units Subcutaneous BID   pantoprazole  (PROTONIX ) IV  40 mg Intravenous Daily   potassium chloride   40 mEq Per Tube Q4H   sodium chloride  flush  10-40 mL Intracatheter Q12H   warfarin  3 mg Per Tube ONCE-1600    Warfarin - Pharmacist Dosing Inpatient   Does not apply q1600   Continuous Infusions:  amiodarone  30 mg/hr (08/19/23 0600)   feeding supplement (VITAL AF 1.2 CAL) 60 mL/hr at 08/19/23 0600   furosemide  (LASIX ) 200 mg in dextrose  5 % 100 mL (2 mg/mL) infusion 20 mg/hr (08/19/23 0600)   magnesium  sulfate bolus IVPB     milrinone  0.25 mcg/kg/min (08/19/23 0600)   penicillin  G potassium 12 Million Units in dextrose  5 % 500 mL CONTINUOUS infusion 41.7 mL/hr at 08/19/23 0600   PRN Meds:.clonazePAM , dextrose , morphine  injection, ondansetron  (ZOFRAN ) IV, mouth rinse, oxyCODONE , prochlorperazine , senna-docusate, sodium chloride  flush, traMADol   General appearance: cooperative, distracted, and fatigued Heart: regular rate and rhythm and tachy Lungs: fairly clear anteriorly Abdomen: soft, non tender Extremities: min edema Wound: prevena in place  Lab Results: CBC: Recent Labs    08/18/23 0430  WBC 7.8  HGB 8.9*  HCT 27.7*  PLT 218   BMET:  Recent Labs    08/18/23 1330 08/19/23 0426  NA 133* 134*  K 3.7 3.6  CL 97* 97*  CO2 26 27  GLUCOSE 262* 169*  BUN 27* 25*  CREATININE 0.97 0.96  CALCIUM  8.3* 8.6*    CMET: Lab Results  Component Value Date   WBC 7.8 08/18/2023   HGB 8.9 (L) 08/18/2023   HCT 27.7 (L) 08/18/2023  PLT 218 08/18/2023   GLUCOSE 169 (H) 08/19/2023   CHOL 95 08/07/2023   TRIG 73 08/12/2023   HDL 27 (L) 08/07/2023   LDLDIRECT 97.0 04/03/2023   LDLCALC 58 08/07/2023   ALT 16 08/18/2023   AST 19 08/18/2023   NA 134 (L) 08/19/2023   K 3.6 08/19/2023   CL 97 (L) 08/19/2023   CREATININE 0.96 08/19/2023   BUN 25 (H) 08/19/2023   CO2 27 08/19/2023   TSH 2.470 08/01/2023   INR 2.1 (H) 08/19/2023   HGBA1C 12.8 (H) 07/31/2023   MICROALBUR 22.5 (H) 04/03/2023      PT/INR:  Recent Labs    08/19/23 0426  LABPROT 23.6*  INR 2.1*   Radiology: US  EKG SITE RITE Result Date: 08/18/2023 If Site Rite image not attached, placement could not be confirmed  due to current cardiac rhythm.   Recent Results (from the past 240 hours)  MRSA Next Gen by PCR, Nasal     Status: None   Collection Time: 08/09/23 10:16 AM   Specimen: Nasal Mucosa; Nasal Swab  Result Value Ref Range Status   MRSA by PCR Next Gen NOT DETECTED NOT DETECTED Final    Comment: (NOTE) The GeneXpert MRSA Assay (FDA approved for NASAL specimens only), is one component of a comprehensive MRSA colonization surveillance program. It is not intended to diagnose MRSA infection nor to guide or monitor treatment for MRSA infections. Test performance is not FDA approved in patients less than 97 years old. Performed at [facility], [address] [place], [place] [address]   Culture, Respiratory w Gram Stain     Status: None   Collection Time: 08/09/23  2:03 PM   Specimen: Tracheal Aspirate; Respiratory  Result Value Ref Range Status   Specimen Description TRACHEAL ASPIRATE  Final   Special Requests NONE  Final   Gram Stain RARE WBC SEEN NO ORGANISMS SEEN   Final   Culture   Final    NO GROWTH 2 DAYS Performed at [facility], [address] [place], [place] [address]    Report Status 08/11/2023 FINAL  Final  Aerobic/Anaerobic Culture w Gram Stain (surgical/deep wound)     Status: None   Collection Time: 08/13/23  2:06 PM   Specimen: Mitral Valve Leaflets; Tissue  Result Value Ref Range Status   Specimen Description TISSUE  Final   Special Requests  ANTERIOR MITRAL VALVE LEAFLETS PT ON VANC ANCEF   Final   Gram Stain NO WBC SEEN NO ORGANISMS SEEN   Final   Culture   Final    No growth aerobically or anaerobically. Performed at [facility], [address] [place], [place] [address]    Report Status 08/18/2023 FINAL  Final    Assessment/Plan: S/P Procedure(s) (LRB): MITRAL VALVE REPLACEMENT USING ON-X PROSTHETIC MITRAL HEART VALVE SIZE 31/33MM (N/A) ECHOCARDIOGRAM, TRANSESOPHAGEAL, INTRAOPERATIVE (N/A) CLIPPING, LEFT ATRIAL APPENDAGE USING  ATRICURE LAA EXCLUSION SYSTEM SIZE 40 POD#6  1 afeb, S BP 80's-120's, HR- sinus tachy 120's-130's, Gtts- lasix , amio and milrinone - Co-ox 72 2 O2 sats ok on 3 liters 3 UOP excellent, > 6 liters 4 getting TF's at FS 5 BS mostly elevated to 200's- modified insulin  dose yesterday, cont same for now w/ SSI- may need additional dosing adjustments 6 normal creat, BUN slightly uo at 25 7 Mg++ 1.6 - replacement ordered 8 INR 2.1- pharm dosing coumadin  for mech On X 9 being evaluated by CIR when medically ready 10 PCCM and AHF  leading medical management 11 ID  managing abx for endocarditis- on Pen G 12 rehab and pulm hygiene as able  Lindi Revering PA-C Pager [phone] 08/19/2023 7:41 AM  Agree with above Remains on lasix , amio, milr On coumadin  INR 2.1 Planned cardioversion after more optimization PT/OT  Hilarie Lovely

## 2023-08-19 NOTE — TOC Progression Note (Signed)
 Transition of Care [facility]) - Progression Note    Patient Details  Name: Melissa Bray MRN: [id number] Date of Birth: Apr 27, 1960  Transition of [facility]) CM/SW Contact  Benjiman Bras, RN Phone Number:[phone] 08/19/2023, 5:05 PM  Clinical Narrative:     TOC CM faxed FMLA paperwork to TCTS office manager to complete asap. Son states she will not get paid without paperwork being completed. Plan is CIR, then home with Home IV abx with [employer].   Expected Discharge Plan: IP Rehab Facility Barriers to Discharge: Continued Medical Work up  Expected Discharge Plan and Services   Discharge Planning Services: CM Consult Post Acute Care Choice: IP Rehab Living arrangements for the past 2 months: Single Family Home                             HH Agency: [employer] Date HH Agency Contacted: 08/18/23 Time HH Agency Contacted: [phone] Representative spoke with at [facility] Agency: Kay Parson RN, [employer] rep   Social Determinants of Health (SDOH) Interventions SDOH Screenings   Food Insecurity: No Food Insecurity (07/31/2023)  Housing: Low Risk  (07/31/2023)  Transportation Needs: No Transportation Needs (07/31/2023)  Utilities: Not At Risk (07/31/2023)  Depression (PHQ2-9): Low Risk  (06/27/2023)  Tobacco Use: Low Risk  (08/08/2023)    Readmission Risk Interventions     No data to display

## 2023-08-19 NOTE — Progress Notes (Signed)
 NAME:  Melissa Bray, MRN:  [id number], DOB:  Jun 18, 1960, LOS: 17 ADMISSION DATE:  07/31/2023, CONSULTATION DATE:  5/9 REFERRING MD:  Melissa Bray, CHIEF COMPLAINT:  respiratory failure   History of Present Illness:  Melissa Bray is a 63 y/o woman with a history of DM, HTN who presented on 5/1 with cellulitis of her LUE and sepsis. At presentation she complained of fevers, generalized weakness, and reduced PO intake. During her evaluation she was diagnosed with GBS bacteremia and mitral valve endocarditis with leaflet perforation. She required dental extractions for dental carries. Plans have been in place for mitral valve repair by TCTS. She was planned for [facility] today; after coming to the cath lab and going through timeout procedure she was moved to the cath lab table. She very quickly got distressed lying back, trying to sit up. She was placed on bipap for respiratory distress. She suddenly went unresponsive during this time without ongoing agitation. No sedation was given. PCCM was consulted in cath lab for emergent management of respiratory failure.    Pertinent  Medical History  MD HTN HLD Breast cancer, s/p left mastectomy  Significant Hospital Events: Including procedures, antibiotic start and stop dates in addition to other pertinent events   5/1 Admitted  5/9 deteriorated due to flash pulmonary edema, intubated, IABP. 5/12 Extubated  5/13 Remains off vent, overnight needing levophed  gtt restarted but weaning off  5/14 plan for mitral valve replacement, still on IABP 1:1  5/15 mitral valve replacement, remains on vent post op. Extubated, IABP removed 5/16 hypertensive, required multiple doses of hydralazine .  Remain on milrinone  5/17 remain on milrinone  at 0.25, tachycardic  Interim History / Subjective:  Nausea is improved.   Objective    Physical Exam  General: elderly woman lying in the recliner in NAD HEENT: Melissa Bray/AT, eyes anicteric Neck: lines removed Neuro: awake and alert,  answering questions appropriately Chest: breathing comfortably on Reader, reduced breath sounds in bases Heart: S1S2, tachycardic, reg rhythm, MV click Abdomen: obese, soft, NT  Coox 73% Na 134 BUN 25 Creatinine 0.96 INR 2.1  I/O -27L in last 24 hours; net +8.4 L for the admission  Resolved Hospital Problem list :  Acute encephalopathy due to hypercapnia Hyperkalemia, resolved> wonder if this was due to succinylcholine  at intubation Hypomagnesemia thrombocytopenia  Assessment & Plan:  Acute mitral valve Strep agalactiae endocarditis with severe mitral regurgitation status post mechanical mitral valve replacement Acute HFrEF with cardiogenic shock on milrinone  Therapeutic AC on warfarin for mechanical MV A-flutter with RVR -post-op care per TCTS -diuresis -pain control per protocol -Needs 6 weeks of PCN G since date of surgery 5/14 per ID (until 6/25). ID needs to be reconsulted when she transfers to rehab per their request. -warfarin for mechanical MV; dosing per pharmacy. Goal INR 2.5-3.5. -con't PICC line -con't milrinone  and amiodarone  per AHF -anticipate potential need for cardioversion to get her HR controlled eventually -remains off antihypertensives  Group A streptococcus bacteremia  -PCN G per ID x 6 weeks (stop date 6/25)  Acute pulmonary edema due to hypertension, improved Acute respiratory failure with hypoxia and hypercapnia -diuresis-- lasix  gtt, spironolactone  -pumonary hygiene -wean O2 to maintain SpO2 90%  Hypervolemic hyponatremia -con't diuresis -avoid hypotonic fluids; hard when PCN G is in d5w.   Poorly controlled diabetes with hyperglycemia-better controlled today; getting large dextrose  load with PCN G -SSI PRN -con't glargine 35 units BID> will need less when she comes off PCN G -TF coverage 3 units q4h  -goal BG 140-180  Anemia due to critical illness, operative blood loss -no current need for transfusion  Morbid obesity -recommend modest  weight loss as a long term goal  Nausea  -zofran  & compazine  PRN  Anxiety -clonazepam  BID  Diarrhea -banatrol, increased to 4 times daily -encourage PO intake to reduce reliance on TF  H/o breast cancer -arimidex   No family at bedside during rounds today.  Best Practice (right click and "Reselect all SmartList Selections" daily)   Diet/type: NPO tube feeds DVT prophylaxis Coumadin  Pressure ulcer(s): Deferred to nursing notes GI prophylaxis: PPI Lines: Central line- picc, Aline, still needed.  Foley:  removal ordered  Code Status:  full code Last date of multidisciplinary goals of care discussion- 5/13   Joesph Mussel, DO 08/19/23 9:31 AM [facility]  For contact information, see Amion. If no response to pager, please call PCCM consult pager. After hours, 7PM- 7AM, please call Elink.

## 2023-08-19 NOTE — Progress Notes (Signed)
 Patient ID: Melissa Bray, female   DOB: 1960/12/11, 63 y.o.   MRN: [id number]  TCTS Evening Rounds:  Hemodynamics fairly stable on milrinone  0.125. HR was 130's all day. I am not sure this sinus tachy. May be atrial flutter.  Still diuresing well on lasix  20mg /hr.

## 2023-08-19 NOTE — Plan of Care (Signed)
 Problem: Education: Goal: Knowledge of General Education information will improve Description: Including pain rating scale, medication(s)/side effects and non-pharmacologic comfort measures Outcome: Progressing   Problem: Health Behavior/Discharge Planning: Goal: Ability to manage health-related needs will improve Outcome: Progressing   Problem: Clinical Measurements: Goal: Ability to maintain clinical measurements within normal limits will improve Outcome: Progressing Goal: Will remain free from infection Outcome: Progressing Goal: Diagnostic test results will improve Outcome: Progressing Goal: Respiratory complications will improve Outcome: Progressing Goal: Cardiovascular complication will be avoided Outcome: Progressing   Problem: Activity: Goal: Risk for activity intolerance will decrease Outcome: Progressing   Problem: Nutrition: Goal: Adequate nutrition will be maintained Outcome: Progressing   Problem: Coping: Goal: Level of anxiety will decrease Outcome: Progressing   Problem: Elimination: Goal: Will not experience complications related to bowel motility Outcome: Progressing Goal: Will not experience complications related to urinary retention Outcome: Progressing   Problem: Pain Managment: Goal: General experience of comfort will improve and/or be controlled Outcome: Progressing   Problem: Safety: Goal: Ability to remain free from injury will improve Outcome: Progressing   Problem: Skin Integrity: Goal: Risk for impaired skin integrity will decrease Outcome: Progressing   Problem: Education: Goal: Ability to describe self-care measures that may prevent or decrease complications (Diabetes Survival Skills Education) will improve Outcome: Progressing Goal: Individualized Educational Video(s) Outcome: Progressing   Problem: Coping: Goal: Ability to adjust to condition or change in health will improve Outcome: Progressing   Problem: Fluid  Volume: Goal: Ability to maintain a balanced intake and output will improve Outcome: Progressing   Problem: Health Behavior/Discharge Planning: Goal: Ability to identify and utilize available resources and services will improve Outcome: Progressing Goal: Ability to manage health-related needs will improve Outcome: Progressing   Problem: Metabolic: Goal: Ability to maintain appropriate glucose levels will improve Outcome: Progressing   Problem: Nutritional: Goal: Maintenance of adequate nutrition will improve Outcome: Progressing Goal: Progress toward achieving an optimal weight will improve Outcome: Progressing   Problem: Skin Integrity: Goal: Risk for impaired skin integrity will decrease Outcome: Progressing   Problem: Tissue Perfusion: Goal: Adequacy of tissue perfusion will improve Outcome: Progressing   Problem: Fluid Volume: Goal: Hemodynamic stability will improve Outcome: Progressing   Problem: Clinical Measurements: Goal: Signs and symptoms of infection will decrease Outcome: Progressing   Problem: Clinical Measurements: Goal: Ability to avoid or minimize complications of infection will improve Outcome: Progressing   Problem: Skin Integrity: Goal: Skin integrity will improve Outcome: Progressing   Problem: Education: Goal: Understanding of CV disease, CV risk reduction, and recovery process will improve Outcome: Progressing Goal: Individualized Educational Video(s) Outcome: Progressing   Problem: Activity: Goal: Ability to return to baseline activity level will improve Outcome: Progressing   Problem: Cardiovascular: Goal: Ability to achieve and maintain adequate cardiovascular perfusion will improve Outcome: Progressing Goal: Vascular access site(s) Level 0-1 will be maintained Outcome: Progressing   Problem: Health Behavior/Discharge Planning: Goal: Ability to safely manage health-related needs after discharge will improve Outcome: Progressing    Problem: Activity: Goal: Ability to tolerate increased activity will improve Outcome: Progressing   Problem: Respiratory: Goal: Ability to maintain a clear airway and adequate ventilation will improve Outcome: Progressing   Problem: Role Relationship: Goal: Method of communication will improve Outcome: Progressing   Problem: Cardiac: Goal: Ability to achieve and maintain adequate cardiopulmonary perfusion will improve Outcome: Progressing Goal: Vascular access site(s) Level 0-1 will be maintained Outcome: Progressing   Problem: Fluid Volume: Goal: Ability to achieve a balanced intake and output will improve Outcome: Progressing  Problem: Physical Regulation: Goal: Complications related to the disease process, condition or treatment will be avoided or minimized Outcome: Progressing

## 2023-08-20 ENCOUNTER — Telehealth: Payer: Self-pay

## 2023-08-20 DIAGNOSIS — R739 Hyperglycemia, unspecified: Secondary | ICD-10-CM | POA: Diagnosis not present

## 2023-08-20 DIAGNOSIS — R5381 Other malaise: Secondary | ICD-10-CM | POA: Diagnosis not present

## 2023-08-20 DIAGNOSIS — J9601 Acute respiratory failure with hypoxia: Secondary | ICD-10-CM | POA: Diagnosis not present

## 2023-08-20 DIAGNOSIS — I059 Rheumatic mitral valve disease, unspecified: Secondary | ICD-10-CM | POA: Diagnosis not present

## 2023-08-20 DIAGNOSIS — I340 Nonrheumatic mitral (valve) insufficiency: Secondary | ICD-10-CM | POA: Diagnosis not present

## 2023-08-20 DIAGNOSIS — R570 Cardiogenic shock: Secondary | ICD-10-CM | POA: Diagnosis not present

## 2023-08-20 LAB — BASIC METABOLIC PANEL WITH GFR
Anion gap: 8 (ref 5–15)
BUN: 24 mg/dL — ABNORMAL HIGH (ref 8–23)
CO2: 29 mmol/L (ref 22–32)
Calcium: 8.3 mg/dL — ABNORMAL LOW (ref 8.9–10.3)
Chloride: 96 mmol/L — ABNORMAL LOW (ref 98–111)
Creatinine, Ser: 1.04 mg/dL — ABNORMAL HIGH (ref 0.44–1.00)
GFR, Estimated: >60 mL/min (ref >60)
Glucose, Bld: 89 mg/dL (ref 70–99)
Potassium: 3.5 mmol/L (ref 3.5–5.1)
Sodium: 133 mmol/L — ABNORMAL LOW (ref 135–145)

## 2023-08-20 LAB — GLUCOSE, CAPILLARY
Glucose-Capillary: 85 mg/dL (ref 70–99)
Glucose-Capillary: 115 mg/dL — ABNORMAL HIGH (ref 70–99)
Glucose-Capillary: 270 mg/dL — ABNORMAL HIGH (ref 70–99)
Glucose-Capillary: 132 mg/dL — ABNORMAL HIGH (ref 70–99)
Glucose-Capillary: 176 mg/dL — ABNORMAL HIGH (ref 70–99)

## 2023-08-20 LAB — PROTIME-INR
INR: 1.5 — ABNORMAL HIGH (ref 0.8–1.2)
Prothrombin Time: 18.4 s — ABNORMAL HIGH (ref 11.4–15.2)

## 2023-08-20 LAB — COOXEMETRY PANEL
Carboxyhemoglobin: 0.9 % (ref 0.5–1.5)
Methemoglobin: <0.7 % (ref 0.0–1.5)
O2 Saturation: 63.2 %
Total hemoglobin: 8 g/dL — ABNORMAL LOW (ref 12.0–16.0)

## 2023-08-20 LAB — MAGNESIUM: Magnesium: 2 mg/dL (ref 1.7–2.4)

## 2023-08-20 LAB — HEPARIN LEVEL (UNFRACTIONATED): Heparin Unfractionated: 0.35 [IU]/mL (ref 0.30–0.70)

## 2023-08-20 MED ORDER — DIGOXIN 125 MCG PO TABS
0.1250 mg | ORAL_TABLET | Freq: Every day | ORAL | Status: DC
  Administered 2023-08-21 – 2023-08-24 (×4): 0.125 mg
  Filled 2023-08-20 (×4): qty 1

## 2023-08-20 MED ORDER — MELATONIN 5 MG PO TABS: 5.0000 mg | ORAL_TABLET | Freq: Every evening | ORAL | Status: DC | PRN

## 2023-08-20 MED ORDER — CLONAZEPAM 0.5 MG PO TABS
0.5000 mg | ORAL_TABLET | Freq: Two times a day (BID) | ORAL | Status: DC | PRN
  Administered 2023-08-21 – 2023-08-23 (×3): 0.5 mg
  Filled 2023-08-20 (×3): qty 1

## 2023-08-20 MED ORDER — ENOXAPARIN SODIUM 40 MG/0.4ML IJ SOSY
40.0000 mg | PREFILLED_SYRINGE | INTRAMUSCULAR | Status: DC
  Administered 2023-08-20: 40 mg via SUBCUTANEOUS
  Filled 2023-08-20: qty 0.4

## 2023-08-20 MED ORDER — INSULIN GLARGINE-YFGN 100 UNIT/ML ~~LOC~~ SOLN
20.0000 [IU] | Freq: Two times a day (BID) | SUBCUTANEOUS | Status: DC
  Administered 2023-08-20 – 2023-08-21 (×2): 20 [IU] via SUBCUTANEOUS
  Filled 2023-08-20 (×3): qty 0.2

## 2023-08-20 MED ORDER — SPIRONOLACTONE 25 MG PO TABS
25.0000 mg | ORAL_TABLET | Freq: Every day | ORAL | Status: DC
  Administered 2023-08-20 – 2023-08-22 (×3): 25 mg
  Filled 2023-08-20 (×3): qty 1

## 2023-08-20 MED ORDER — SODIUM CHLORIDE 0.9 % IV SOLN
12.0000 10*6.[IU] | Freq: Two times a day (BID) | INTRAVENOUS | Status: DC
  Administered 2023-08-20 – 2023-08-29 (×17): 12 10*6.[IU] via INTRAVENOUS
  Filled 2023-08-20 (×21): qty 12

## 2023-08-20 MED ORDER — POTASSIUM CHLORIDE 20 MEQ PO PACK: 20.0000 meq | PACK | ORAL | Status: DC

## 2023-08-20 MED ORDER — POTASSIUM CHLORIDE 20 MEQ PO PACK
60.0000 meq | PACK | ORAL | Status: AC
  Administered 2023-08-20 (×2): 60 meq
  Filled 2023-08-20: qty 3

## 2023-08-20 MED ORDER — MELATONIN 5 MG PO TABS
5.0000 mg | ORAL_TABLET | Freq: Every evening | ORAL | Status: DC | PRN
  Administered 2023-08-20 – 2023-08-23 (×3): 5 mg
  Filled 2023-08-20 (×3): qty 1

## 2023-08-20 MED ORDER — HEPARIN (PORCINE) 25000 UT/250ML-% IV SOLN
1400.0000 [IU]/h | INTRAVENOUS | Status: DC
  Administered 2023-08-20 – 2023-08-22 (×3): 1400 [IU]/h via INTRAVENOUS
  Filled 2023-08-20 (×4): qty 250

## 2023-08-20 MED ORDER — ACETAZOLAMIDE 250 MG PO TABS
500.0000 mg | ORAL_TABLET | Freq: Two times a day (BID) | ORAL | Status: AC
  Administered 2023-08-20 (×2): 500 mg via ORAL
  Filled 2023-08-20 (×2): qty 2

## 2023-08-20 MED ORDER — WARFARIN SODIUM 2 MG PO TABS
6.0000 mg | ORAL_TABLET | Freq: Once | ORAL | Status: AC
  Administered 2023-08-20: 6 mg
  Filled 2023-08-20: qty 3

## 2023-08-20 MED ORDER — DIGOXIN 125 MCG PO TABS
0.1250 mg | ORAL_TABLET | Freq: Every day | ORAL | Status: DC
Start: 2023-08-20 — End: 2023-08-20
  Filled 2023-08-20: qty 1

## 2023-08-20 NOTE — Progress Notes (Signed)
 Speech Language Pathology Treatment: Dysphagia  Patient Details Name: Melissa Bray MRN: [id number] DOB: Jul 29, 1960 Today's Date: 08/20/2023 Time: 1100-1115 SLP Time Calculation (min) (ACUTE ONLY): 15 min  Assessment / Plan / Recommendation Clinical Impression  Pt up in chair, says she is tired of the diet she is on, which is a good sign He is ready to try new foods like toast, oatmeal, cereal and fruit. Pt tolerated regular textured foods. Didn't want much, but no problem masticating. No further signs of regurgitation. Will advance diet to regular/thin and sign off   HPI HPI: Melissa Bray is a 63 yo female presenting to ED 5/1 with fevers, generalized weakness, and reduced PO intake. Found to have LUE cellulitis and sepsis in addition to GBS bacteremia and mitral valve endocarditis with leaflet perforation. Developed respiratory distress and period of unresponsiveness during cath lab procedure 5/9, requiring intubation. ETT 5/9-5/12. Seen by SLP on 5/13, regurgitated immediately after POs. SLP recommended esopahgram. Pt intubated 5/14 for mitral valve surgery, extubated 5/15. SLP asked to reassess. PMH includes DM, HTN, HLD, breast cancer s/p L mastectomy      SLP Plan  All goals met      Recommendations for follow up therapy are one component of a multi-disciplinary discharge planning process, led by the attending physician.  Recommendations may be updated based on patient status, additional functional criteria and insurance authorization.    Recommendations  Diet recommendations: Regular;Thin liquid Liquids provided via: Straw;Cup Medication Administration: Whole meds with liquid Supervision: Patient able to self feed Compensations: Slow rate;Small sips/bites Postural Changes and/or Swallow Maneuvers: Seated upright 90 degrees;Upright 30-60 min after meal                              All goals met     Aldo Sondgeroth, Hardin Leys  08/20/2023, 11:19 AM

## 2023-08-20 NOTE — Progress Notes (Signed)
 Inpatient Rehab Admissions Coordinator:    CIR following for potential admit. Pt. Currently not medically stable but I will follow for admit once stable.   Wandalee Gust, MS, CCC-SLP Rehab Admissions Coordinator  [phone] (celll) [phone] (office)

## 2023-08-20 NOTE — Progress Notes (Signed)
 eLink Physician-Brief Progress Note Patient Name: Melissa Bray DOB: 17-Jul-1960 MRN: [id number]   Date of Service  08/20/2023  HPI/Events of Note  Insomnia   eICU Interventions  Melatonin     Intervention Category Minor Interventions: Routine modifications to care plan (e.g. PRN medications for pain, fever)  Sani Madariaga 08/20/2023, 10:37 PM

## 2023-08-20 NOTE — TOC Progression Note (Addendum)
 Transition of Care [facility]) - Progression Note    Patient Details  Name: Melissa Bray MRN: [id number] Date of Birth: [birth year]  Transition of Care [facility]) CM/SW Contact  Ernst Heap Phone Number: [phone] 08/20/2023, 10:33 AM  Clinical Narrative:   10:29 AM- HF CSW spoke with patients son who inquired about the patients FMLA paperwork hold up. Patient stated that they need it by the end of the week. CSW stated that she will inquire about the status and share updates.   12:07- HF CSW inquired with bedside RN about the status of the patients FMLA paperwork. HF NCM stated that she faxed over the documents to Dr. Berna Breslow office and to call to confirm received.   CSW left a VM for Amalia Badder asking to be called back.   12:09: HF CSW called back and spoke with Stephenie Einstein who stated that the person who checks the fax was out on lunch and to call back after 1pm. Debbie checked and stated that she did not see anything. CSW will follow up.   2:00 PM- HF CSW received a call back from Amalia Badder stating that the paperwork was received. CSW provided the patients son number and for him to be contacted once completed. CSW also stated that the hopes were for the documents to be completed before Friday.   2:27 PM- HF CSW called and shared updates with the patients son.   TOC will continue following.     Expected Discharge Plan: IP Rehab Facility Barriers to Discharge: Continued Medical Work up  Expected Discharge Plan and Services   Discharge Planning Services: CM Consult Post Acute Care Choice: IP Rehab Living arrangements for the past 2 months: Single Family Home                             HH Agency: [employer] Date HH Agency Contacted: 08/18/23 Time HH Agency Contacted: [phone] Representative spoke with at [facility] Agency: Kay Parson RN, [employer] rep   Social Determinants of Health (SDOH) Interventions SDOH Screenings   Food Insecurity: No Food Insecurity (07/31/2023)  Housing: Low Risk   (07/31/2023)  Transportation Needs: No Transportation Needs (07/31/2023)  Utilities: Not At Risk (07/31/2023)  Depression (PHQ2-9): Low Risk  (06/27/2023)  Tobacco Use: Low Risk  (08/08/2023)    Readmission Risk Interventions     No data to display

## 2023-08-20 NOTE — Progress Notes (Signed)
 NAME:  Melissa Bray, MRN:  [id number], DOB:  03/22/1961, LOS: 18 ADMISSION DATE:  07/31/2023, CONSULTATION DATE:  5/9 REFERRING MD:  Melissa Bray, CHIEF COMPLAINT:  respiratory failure   History of Present Illness:  Melissa Bray is a 63 y/o woman with a history of DM, HTN who presented on 5/1 with cellulitis of her LUE and sepsis. At presentation she complained of fevers, generalized weakness, and reduced PO intake. During her evaluation she was diagnosed with GBS bacteremia and mitral valve endocarditis with leaflet perforation. She required dental extractions for dental carries. Plans have been in place for mitral valve repair by TCTS. She was planned for [facility] today; after coming to the cath lab and going through timeout procedure she was moved to the cath lab table. She very quickly got distressed lying back, trying to sit up. She was placed on bipap for respiratory distress. She suddenly went unresponsive during this time without ongoing agitation. No sedation was given. PCCM was consulted in cath lab for emergent management of respiratory failure.    Pertinent  Medical History  MD HTN HLD Breast cancer, s/p left mastectomy  Significant Hospital Events: Including procedures, antibiotic start and stop dates in addition to other pertinent events   5/1 Admitted  5/9 deteriorated due to flash pulmonary edema, intubated, IABP. 5/12 Extubated  5/13 Remains off vent, overnight needing levophed  gtt restarted but weaning off  5/14 plan for mitral valve replacement, still on IABP 1:1  5/15 mitral valve replacement, remains on vent post op. Extubated, IABP removed 5/16 hypertensive, required multiple doses of hydralazine .  Remain on milrinone  5/17 remain on milrinone  at 0.25, tachycardic 5/20 lasix  gtt/acetazolamide , milrinone  down 0.125  Interim History / Subjective:  Flexi out due to leakage and pain Did eat clear liquid breakfast this am  Objective    Physical Exam  General:  older female sitting  in recliner in NAD, fatigued appearing HEENT: MM pink/moist, pupils 3/r Neuro: awakens to verbal but falling back asleep in conversation, oriented x 3, MAE CV: rr, rate 129, +click PULM:  non labored, clear, more shallow/ reduced in bases, only able to pull max 400-500 briefly on IS, 4L [place] at 100% GI: obese, +bs, NT, foley  Extremities: warm/dry, Skin: no rashes   Tmax 99 UOP 5.6L/ 24hrs -2.2L Net +920ml Wts 120.1> 118.3kg  CVP 9 (in chair) Coox 72> 63% on milrinone  0.125  Labs> K 3.5, sCr 0.96> 1.04, INR 1.5  Resolved Hospital Problem list :  Acute encephalopathy due to hypercapnia Hyperkalemia, resolved> wonder if this was due to succinylcholine  at intubation Hypomagnesemia thrombocytopenia  Assessment & Plan:  Acute mitral valve Strep agalactiae endocarditis with severe mitral regurgitation status post mechanical mitral valve replacement Acute HFrEF with cardiogenic shock on milrinone  Therapeutic AC on warfarin for mechanical MV A-flutter with RVR - per TCTS/ AHF - diuresis per TCTS/ AHF> lasix  gtt, diamox , spiro - trending CVP/ coox - milrinone  per AHF - MAP goal > 65, remains off pressors - strict I/Os/ daily wts - amio per AHF, dig added today, DCCV when optimized  - optimize electrolytes - lovenox / coumadin  per pharmacy for mechanical MV, goal INR 2.5-3.5, INR today 1.5> plans for heparin  gtt given suboptimal INR  - abx as below - multimodal pain management   Group A streptococcus bacteremia  - PCN G per ID x 6 weeks (stop date 6/25)  Acute pulmonary edema due to hypertension, improved Acute respiratory failure with hypoxia and hypercapnia - cont diuresis as above - aggressive pulm  hygiene- IS/ mobolize - wean O2 for sat goal > 90%  Hypervolemic hyponatremia - diuresis as above - restrict free water, avoid hypotonic fluids; hard w/ d5w in PCN G   Poorly controlled diabetes with hyperglycemia - improving range - cont SSI prn, TF 3u q 4, and semglee  35u  BID - will need adjustment when off PCN G (6/25) -goal BG 140-180  Anemia due to critical illness, operative blood loss - trend CBC periodically  Morbid obesity BMI 43 - outpt weight loss  Nausea  - resolved, prn zofran  & compazine    Anxiety - clonazepam  BID prn > reduce given lethargy  Diarrhea - improved, flexi out due to pain / leakage - cont banatrol QID  At risk for malnutrition - cont cortrak given poor PO intake thus far - cleared by SLP this am to advance diet given previous concern for regurgitation on 5/13 - will need calorie count to monitor PO's to adjust EN per RD - cont to encourage PO intake to reduce reliance on TF  H/o breast cancer - arimidex    Best Practice (right click and "Reselect all SmartList Selections" daily)   Diet/type: EN per RD, advance diet as tolerated DVT prophylaxis Coumadin > heparin  gtt Pressure ulcer(s): Deferred to nursing notes GI prophylaxis: PPI Lines: Central line- picc Foley:  removal ordered  Code Status:  full code Last date of multidisciplinary goals of care discussion- 5/13    Melissa Glisson, MSN, AG-ACNP-BC [facility] 08/20/2023, 10:08 AM  See Amion for pager If no response to pager , please call [phone] until 7pm After 7:00 pm call Elink  [phone]

## 2023-08-20 NOTE — Plan of Care (Signed)
 Problem: Education: Goal: Knowledge of General Education information will improve Description: Including pain rating scale, medication(s)/side effects and non-pharmacologic comfort measures Outcome: Progressing   Problem: Health Behavior/Discharge Planning: Goal: Ability to manage health-related needs will improve Outcome: Progressing   Problem: Clinical Measurements: Goal: Ability to maintain clinical measurements within normal limits will improve Outcome: Progressing Goal: Will remain free from infection Outcome: Progressing Goal: Diagnostic test results will improve Outcome: Progressing Goal: Respiratory complications will improve Outcome: Progressing Goal: Cardiovascular complication will be avoided Outcome: Progressing   Problem: Activity: Goal: Risk for activity intolerance will decrease Outcome: Progressing   Problem: Nutrition: Goal: Adequate nutrition will be maintained Outcome: Progressing   Problem: Coping: Goal: Level of anxiety will decrease Outcome: Progressing   Problem: Elimination: Goal: Will not experience complications related to bowel motility Outcome: Progressing Goal: Will not experience complications related to urinary retention Outcome: Progressing   Problem: Pain Managment: Goal: General experience of comfort will improve and/or be controlled Outcome: Progressing   Problem: Safety: Goal: Ability to remain free from injury will improve Outcome: Progressing   Problem: Skin Integrity: Goal: Risk for impaired skin integrity will decrease Outcome: Progressing   Problem: Education: Goal: Ability to describe self-care measures that may prevent or decrease complications (Diabetes Survival Skills Education) will improve Outcome: Progressing Goal: Individualized Educational Video(s) Outcome: Progressing   Problem: Coping: Goal: Ability to adjust to condition or change in health will improve Outcome: Progressing   Problem: Fluid  Volume: Goal: Ability to maintain a balanced intake and output will improve Outcome: Progressing   Problem: Health Behavior/Discharge Planning: Goal: Ability to identify and utilize available resources and services will improve Outcome: Progressing Goal: Ability to manage health-related needs will improve Outcome: Progressing   Problem: Metabolic: Goal: Ability to maintain appropriate glucose levels will improve Outcome: Progressing   Problem: Nutritional: Goal: Maintenance of adequate nutrition will improve Outcome: Progressing Goal: Progress toward achieving an optimal weight will improve Outcome: Progressing   Problem: Skin Integrity: Goal: Risk for impaired skin integrity will decrease Outcome: Progressing   Problem: Tissue Perfusion: Goal: Adequacy of tissue perfusion will improve Outcome: Progressing   Problem: Fluid Volume: Goal: Hemodynamic stability will improve Outcome: Progressing   Problem: Clinical Measurements: Goal: Signs and symptoms of infection will decrease Outcome: Progressing   Problem: Clinical Measurements: Goal: Ability to avoid or minimize complications of infection will improve Outcome: Progressing   Problem: Skin Integrity: Goal: Skin integrity will improve Outcome: Progressing   Problem: Education: Goal: Understanding of CV disease, CV risk reduction, and recovery process will improve Outcome: Progressing Goal: Individualized Educational Video(s) Outcome: Progressing   Problem: Activity: Goal: Ability to return to baseline activity level will improve Outcome: Progressing   Problem: Cardiovascular: Goal: Ability to achieve and maintain adequate cardiovascular perfusion will improve Outcome: Progressing Goal: Vascular access site(s) Level 0-1 will be maintained Outcome: Progressing   Problem: Health Behavior/Discharge Planning: Goal: Ability to safely manage health-related needs after discharge will improve Outcome: Progressing    Problem: Activity: Goal: Ability to tolerate increased activity will improve Outcome: Progressing   Problem: Respiratory: Goal: Ability to maintain a clear airway and adequate ventilation will improve Outcome: Progressing   Problem: Role Relationship: Goal: Method of communication will improve Outcome: Progressing   Problem: Cardiac: Goal: Ability to achieve and maintain adequate cardiopulmonary perfusion will improve Outcome: Progressing Goal: Vascular access site(s) Level 0-1 will be maintained Outcome: Progressing   Problem: Fluid Volume: Goal: Ability to achieve a balanced intake and output will improve Outcome: Progressing  Problem: Physical Regulation: Goal: Complications related to the disease process, condition or treatment will be avoided or minimized Outcome: Progressing

## 2023-08-20 NOTE — Telephone Encounter (Signed)
 FMLA form completed/ pt's son will pick up from front desk/ Beginning LOA 07/31/23 through 11/10/23./ DOS 08/13/23.

## 2023-08-20 NOTE — Progress Notes (Addendum)
 [address]       [place] [address]             [phone]      7 Days Post-Op Procedure(s) (LRB): MITRAL VALVE REPLACEMENT USING ON-X PROSTHETIC MITRAL HEART VALVE SIZE 31/33MM (N/A) ECHOCARDIOGRAM, TRANSESOPHAGEAL, INTRAOPERATIVE (N/A) CLIPPING, LEFT ATRIAL APPENDAGE USING ATRICURE LAA EXCLUSION SYSTEM SIZE 40 Subjective: Patient states the rectal tube is hurting, otherwise no new complaints.  Objective: Vital signs in last 24 hours: Temp:  [97.4 F (36.3 C)-99.1 F (37.3 C)] 97.4 F (36.3 C) (05/21 0700) Pulse Rate:  [127-207] 138 (05/21 0656) Cardiac Rhythm: Sinus tachycardia (05/20 1957) Resp:  [5-29] 15 (05/21 0656) BP: (77-128)/(52-107) 104/73 (05/21 0645) SpO2:  [91 %-100 %] 99 % (05/21 0656) Arterial Line BP: (41-143)/(31-139) 112/108 (05/21 0656) Weight:  [118.3 kg] 118.3 kg (05/21 0500)  Hemodynamic parameters for last 24 hours: CVP:  [0 mmHg-22 mmHg] 10 mmHg  Intake/Output from previous day: 05/20 0701 - 05/21 0700 In: 3430.8 [I.V.:794.6; NG/GT:1500; IV Piggyback:1136.2] Out: 5710 [Urine:5675; Stool:35] Intake/Output this shift: No intake/output data recorded.  General appearance: alert, cooperative, and drowsy Neurologic: intact Heart: aflutter, no murmur, AV click Lungs: diminished bibasilar breath sounds Abdomen: soft, non-tender; bowel sounds normal; no masses,  no organomegaly Extremities: edema 1+ BLE Wound: Clean and dry without sign of infection  Lab Results: Recent Labs    08/18/23 0430  WBC 7.8  HGB 8.9*  HCT 27.7*  PLT 218   BMET:  Recent Labs    08/19/23 0426 08/20/23 0434  NA 134* 133*  K 3.6 3.5  CL 97* 96*  CO2 27 29  GLUCOSE 169* 89  BUN 25* 24*  CREATININE 0.96 1.04*  CALCIUM  8.6* 8.3*    PT/INR:  Recent Labs    08/20/23 0434  LABPROT 18.4*  INR 1.5*   ABG    Component Value Date/Time   PHART 7.465 (H) 08/14/2023 1700   HCO3 25.0 08/14/2023 1700   TCO2 26 08/14/2023 1700    ACIDBASEDEF 1.0 08/08/2023 1634   O2SAT 63.2 08/20/2023 0433   CBG (last 3)  Recent Labs    08/19/23 2321 08/20/23 0355 08/20/23 0731  GLUCAP 146* 85 115*    Assessment/Plan: S/P Procedure(s) (LRB): MITRAL VALVE REPLACEMENT USING ON-X PROSTHETIC MITRAL HEART VALVE SIZE 31/33MM (N/A) ECHOCARDIOGRAM, TRANSESOPHAGEAL, INTRAOPERATIVE (N/A) CLIPPING, LEFT ATRIAL APPENDAGE USING ATRICURE LAA EXCLUSION SYSTEM SIZE 40  Neuro: Pain due to rectal tube, continue pain regimen   CV: Possible atrial flutter with RVR, HR 130s. On Amiodarone  gtt. SBP 100s. Plan for DC-CV after further optimization. On Milrinone  0.125, Spironolactone  12.5mg  daily. AHF team is following, appreciate their assistance.    Pulm: Saturating well on 2L [place]. Last CXR with bibasilar atelectasis. Encourage IS and ambulation   GI: +BM, Cortrak in place. Tube feeds at goal. Denies nausea, vomiting.    Endo: CBGs 146/85/115. Much improved on Semglee  35U BID, Novolog  3U Q4H and SSI PRN.    Renal: Cr 1.04, slightly elevated due to diuresis but overall stable. On Lasix  drip. UO 5675cc/24hrs. At preop weight. K 3.5, supplement. Hyponatremia stable.    ID: Endocarditis, on Penicillin  G. ID recommending IB PCN G for 6 weeks starting from 05/14, end date of 06/25. They have signed off but will arrange outpatient follow up at discharge. Tmax 99.1, no leukocytosis.   DVT Prophylaxis: Coumadin    Deconditioning: Continue work with PT/OT. Plan for CIR at discharge.   Anticoagulation: On Coumadin , INR 1.5.  Received 3mg  yesterday. Coumadin  dosing per pharmacy. Goal 2.5-3.5. Per pharmacy they will increase Lovenox  to therapeutic dose since INR is subtherapeutic and DC-CV is planned for Friday.    Dispo: Continue ICU care   LOS: 18 days    Randa Burton, PA-C 08/20/2023   Agree Stable day Adjusting coumadin  Cardioversion on Friday  Debra Calabretta O Delwin Raczkowski

## 2023-08-20 NOTE — Progress Notes (Signed)
 PHARMACY - ANTICOAGULATION CONSULT NOTE  Pharmacy Consult for warfarin + heparin  Indication: onX mMVR  No Known Allergies  Patient Measurements: Height: 5\' 5"  (165.1 cm) Weight: 118.3 kg (260 lb 12.9 oz) IBW/kg (Calculated) : 57 HEPARIN  DW (KG): 84.3  Vital Signs: Temp: 97.4 F (36.3 C) (05/21 1530) Temp Source: Axillary (05/21 1530) BP: 110/76 (05/21 1600) Pulse Rate: 139 (05/21 1600)  Labs: Recent Labs    08/18/23 0430 08/18/23 1330 08/19/23 0426 08/20/23 0434 08/20/23 1716  HGB 8.9*  --   --   --   --   HCT 27.7*  --   --   --   --   PLT 218  --   --   --   --   LABPROT 28.1*  --  23.6* 18.4*  --   INR 2.6*  --  2.1* 1.5*  --   HEPARINUNFRC  --   --   --   --  0.35  CREATININE 0.91 0.97 0.96 1.04*  --     Estimated Creatinine Clearance: 70.3 mL/min (A) (by C-G formula based on SCr of 1.04 mg/dL (H)).   Medical History: Past Medical History:  Diagnosis Date   Anxiety    Breast cancer (HCC)    Diabetes mellitus without complication (HCC)    Hyperlipidemia    Hypertension    Type 2 diabetes mellitus without retinopathy (HCC) 06/17/2017    Medications:  Scheduled:   acetaZOLAMIDE   500 mg Oral BID   anastrozole   1 mg Per Tube Daily   aspirin   81 mg Per Tube Daily   Chlorhexidine  Gluconate Cloth  6 each Topical Daily   [START ON 08/21/2023] digoxin  0.125 mg Per Tube Daily   feeding supplement (PROSource TF20)  60 mL Per Tube Daily   fiber supplement (BANATROL TF)  60 mL Per Tube QID   insulin  aspart  0-24 Units Subcutaneous Q4H   insulin  aspart  3 Units Subcutaneous Q4H   insulin  glargine-yfgn  20 Units Subcutaneous BID   pantoprazole  (PROTONIX ) IV  40 mg Intravenous Daily   sodium chloride  flush  10-40 mL Intracatheter Q12H   spironolactone   25 mg Per Tube Daily   Warfarin - Pharmacist Dosing Inpatient   Does not apply q1600    Assessment: 75 yof presenting with acute mitral valve endocarditis with perforated mitral leaflet and severe MR. Underwent  dental extraction 5/8. Presented for pre-op RHC and developed flash pulmonary edema requiring intubation - IABP was placed in lab and IV heparin  started. No AC PTA.  Pt now s/p cardiac surgery with onX mMVR placed. Pharmacy consulted to start warfarin 5/16 after IABP removal 5/15. INR today dropped to 1.5. TEE/DCCV planned later this week so will start IV heparin  (ok with surgery since 7 days postop). Pt previously therapeutic on 1400 units/h so will start here. No bolus with recent enoxaparin  admin, will target lower therapeutic range given recent cardiac surgery.  Heparin  level at goal on 1400 units/hr. No issues with infusion or overt s/sx of bleeding to report by RN.  Goal of Therapy:  INR 2.5-3.5 Heparin  level 0.3-0.5 Monitor platelets by anticoagulation protocol: Yes   Plan:  Warfarin 6mg  x1 tonight Daily INR Heparin  1400 units/h no bolus Daily heparin  level   Mohammed Andrew, PharmD Clinical Pharmacist 08/20/2023 6:05 PM Please check AMION for all [facility] Pharmacy numbers

## 2023-08-20 NOTE — Progress Notes (Signed)
 Patient ID: Melissa Bray, female   DOB: May 21, 1960, 63 y.o.   MRN: [id number]     Advanced Heart Failure Rounding Note  Cardiologist: Eilleen Grates, MD   Chief Complaint: CHF  Subjective:    63 y/o obese woman admitted with cardiogenic shock in setting of severe MR due to endocarditis and perforation of anterior MV leaflet. Underwent mMVR on 08/14/23. Post-op course c/b RV Failure  POD#7 MVR  Remains on milrinone  0.125. Co-ox 63%   Lasix  gtt at 20/hr. 5.7L in UOP yesterday. Wt down another 4 lb. Improving but remains edematous. Breathing also improving.   Remains in atrial flutter 140s. On amio gtt@ 30/hr. BP stable.   Scr 0.96>>1.04 K 3.5 Mg 2.0    Objective:    Weight Range: 118.3 kg Body mass index is 43.4 kg/m.   Vital Signs:   Temp:  [97.4 F (36.3 C)-99.1 F (37.3 C)] 97.4 F (36.3 C) (05/21 0700) Pulse Rate:  [127-207] 130 (05/21 0900) Resp:  [5-29] 23 (05/21 0900) BP: (77-128)/(52-107) 120/84 (05/21 0900) SpO2:  [91 %-100 %] 98 % (05/21 0900) Arterial Line BP: (41-143)/(31-139) 92/58 (05/21 0700) Weight:  [118.3 kg] 118.3 kg (05/21 0500) Last BM Date : 08/20/23  Weight change: Filed Weights   08/18/23 0500 08/19/23 0500 08/20/23 0500  Weight: 124.6 kg 120.1 kg 118.3 kg   Intake/Output:  Intake/Output Summary (Last 24 hours) at 08/20/2023 1036 Last data filed at 08/20/2023 0900 Gross per 24 hour  Intake 3182.87 ml  Output 5500 ml  Net -2317.13 ml    Physical Exam   General:  sitting up in chair, fatigued appearing. No respiratory difficulty HEENT: normal Neck: supple. JVD 10 cm. Carotids 2+ bilat; no bruits. No lymphadenopathy or thyromegaly appreciated. Cor: PMI nondisplaced. Irregularly irregular rate and rhythm.  Lungs: decreased BS at the bases bilaterally  Abdomen: soft, nontender, nondistended.  Extremities: no cyanosis, clubbing, rash, 1+ b/l LEE edema Neuro: alert & oriented x 3, cranial nerves grossly intact. moves all 4 extremities w/o  difficulty. Affect pleasant.   Telemetry   AFL 130s-140s, personally reviewed.  Labs    CBC Recent Labs    08/18/23 0430  WBC 7.8  HGB 8.9*  HCT 27.7*  MCV 89.6  PLT 218   Basic Metabolic Panel Recent Labs    40/98/11 0425 08/19/23 0426 08/20/23 0434  NA  --  134* 133*  K  --  3.6 3.5  CL  --  97* 96*  CO2  --  27 29  GLUCOSE  --  169* 89  BUN  --  25* 24*  CREATININE  --  0.96 1.04*  CALCIUM   --  8.6* 8.3*  MG  --  1.6* 2.0  PHOS 3.8  --   --    Imaging   No results found.    Medications:    Scheduled Medications:  acetaZOLAMIDE   500 mg Oral BID   anastrozole   1 mg Per Tube Daily   aspirin   81 mg Per Tube Daily   Chlorhexidine  Gluconate Cloth  6 each Topical Daily   [START ON 08/21/2023] digoxin  0.125 mg Per Tube Daily   feeding supplement (PROSource TF20)  60 mL Per Tube Daily   fiber supplement (BANATROL TF)  60 mL Per Tube QID   insulin  aspart  0-24 Units Subcutaneous Q4H   insulin  aspart  3 Units Subcutaneous Q4H   insulin  glargine-yfgn  35 Units Subcutaneous BID   pantoprazole  (PROTONIX ) IV  40 mg Intravenous Daily  potassium chloride   60 mEq Per Tube Q4H   sodium chloride  flush  10-40 mL Intracatheter Q12H   spironolactone   25 mg Per Tube Daily   warfarin  6 mg Per Tube ONCE-1600   Warfarin - Pharmacist Dosing Inpatient   Does not apply q1600   Infusions:  amiodarone  30 mg/hr (08/20/23 0900)   feeding supplement (VITAL AF 1.2 CAL) 1,000 mL (08/20/23 1019)   furosemide  (LASIX ) 200 mg in dextrose  5 % 100 mL (2 mg/mL) infusion 20 mg/hr (08/20/23 0900)   heparin      milrinone  0.125 mcg/kg/min (08/20/23 0900)   penicillin  G potassium 12 Million Units in dextrose  5 % 500 mL CONTINUOUS infusion 41.7 mL/hr at 08/20/23 0900   PRN Medications: clonazePAM , dextrose , morphine  injection, ondansetron  (ZOFRAN ) IV, mouth rinse, oxyCODONE , prochlorperazine , senna-docusate, sodium chloride  flush, traMADol   Assessment/Plan   1. Cardiogenic shock:  Valvular shock from severe MR.  TEE on 5/5 showed LV EF 65%, normal RV function, severe MR with perforated leaflet and mobile vegetation.  Patient developed flash pulmonary edema 5/9 in setting of marked hypertension -> IABP placed. - s/p mMVR 08/14/23 - POCUS ECHO 5/15 EF 60% RV mod reduced - Remains on Milrinone  0.125. Co-ox 63%.  - Volume improving w/ IV diuretics but remains volume overloaded. Renal fx stable. Continue Lasix  gtt at 20/hr + 500 mg of diamox  500 mg bi  - Continue milrinone  0.125 mcg/kg/min until fully diuresed - increase spironolactone  to 25 mg daily - continue digoxin 0.125 mg daily   2. Mitral regurgitation: Severe on TEE with perforated anterior leaflet and mitral valve vegetation.  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  - s/p MVR with Dr. Luna Salinas 08/14/23 - post-op antibiotics per ID. On PenG, will require 6 weeks IV abx (start date 05/14). - INR 1.5 Warfarin / heparin  bridging per CT surgery   3. Acute hypercarbic/hypoxemic respiratory failure in setting of #1&2 - Resolved - Encouraged IS   4. Atrial flutter with RVR - Continue IV amiodarone  at 30/hr - Will likely need eventual DCCV after diuresis if she does not convert chemically, after further diuresis. Plan for Friday  - Warfarin/ heparin  gtt as above  5. Morbid obesity - Body mass index is 43.4 kg/m.  6. Hypervolemic Hyponatremia - restrict free water - continue diuresis  7. Hypokalemia/hypomag - supp with diuresis   Length of Stay: [address] PA-C  08/20/2023, 10:36 AM  Advanced Heart Failure Team Pager [phone] (M-F; 7a - 5p)  Please contact [facility] for night-coverage after hours (5p -7a ) and weekends on [email or web address]

## 2023-08-20 NOTE — Progress Notes (Addendum)
 PHARMACY - ANTICOAGULATION CONSULT NOTE  Pharmacy Consult for warfarin + heparin  Indication: onX mMVR  No Known Allergies  Patient Measurements: Height: 5\' 5"  (165.1 cm) Weight: 118.3 kg (260 lb 12.9 oz) IBW/kg (Calculated) : 57 HEPARIN  DW (KG): 84.3  Vital Signs: Temp: 98.2 F (36.8 C) (05/21 0330) Temp Source: Oral (05/21 0330) BP: 104/73 (05/21 0645) Pulse Rate: 138 (05/21 0656)  Labs: Recent Labs    08/18/23 0430 08/18/23 1330 08/19/23 0426 08/20/23 0434  HGB 8.9*  --   --   --   HCT 27.7*  --   --   --   PLT 218  --   --   --   LABPROT 28.1*  --  23.6* 18.4*  INR 2.6*  --  2.1* 1.5*  CREATININE 0.91 0.97 0.96 1.04*    Estimated Creatinine Clearance: 70.3 mL/min (A) (by C-G formula based on SCr of 1.04 mg/dL (H)).   Medical History: Past Medical History:  Diagnosis Date   Anxiety    Breast cancer (HCC)    Diabetes mellitus without complication (HCC)    Hyperlipidemia    Hypertension    Type 2 diabetes mellitus without retinopathy (HCC) 06/17/2017    Medications:  Scheduled:   anastrozole   1 mg Per Tube Daily   aspirin   81 mg Per Tube Daily   Chlorhexidine  Gluconate Cloth  6 each Topical Daily   feeding supplement (PROSource TF20)  60 mL Per Tube Daily   fiber supplement (BANATROL TF)  60 mL Per Tube QID   insulin  aspart  0-24 Units Subcutaneous Q4H   insulin  aspart  3 Units Subcutaneous Q4H   insulin  glargine-yfgn  35 Units Subcutaneous BID   pantoprazole  (PROTONIX ) IV  40 mg Intravenous Daily   potassium chloride   60 mEq Per Tube Q4H   sodium chloride  flush  10-40 mL Intracatheter Q12H   spironolactone   12.5 mg Per Tube Daily   Warfarin - Pharmacist Dosing Inpatient   Does not apply q1600    Assessment: 49 yof presenting with acute mitral valve endocarditis with perforated mitral leaflet and severe MR. Underwent dental extraction 5/8. Presented for pre-op RHC and developed flash pulmonary edema requiring intubation - IABP was placed in lab and  IV heparin  started. No AC PTA.  Pt now s/p cardiac surgery with onX mMVR placed. Pharmacy consulted to start warfarin 5/16 after IABP removal 5/15. INR today dropped to 1.5. TEE/DCCV planned later this week so will start IV heparin  (ok with surgery since 7 days postop). Pt previously therapeutic on 1400 units/h so will start here. No bolus with recent enoxaparin  admin, will target lower therapeutic range given recent cardiac surgery.    Goal of Therapy:  INR 2.5-3.5 Heparin  level 0.3-0.5 Monitor platelets by anticoagulation protocol: Yes   Plan:  Warfarin 6mg  x1 tonight Daily INR Heparin  1400 units/h no bolus Check 6h heparin  level   Levin Reamer, PharmD, BCPS, [facility] Clinical Pharmacist [phone] Please check AMION for all [facility] Pharmacy numbers 08/20/2023

## 2023-08-21 DIAGNOSIS — R570 Cardiogenic shock: Secondary | ICD-10-CM | POA: Diagnosis not present

## 2023-08-21 DIAGNOSIS — J9601 Acute respiratory failure with hypoxia: Secondary | ICD-10-CM | POA: Diagnosis not present

## 2023-08-21 DIAGNOSIS — R5381 Other malaise: Secondary | ICD-10-CM | POA: Diagnosis not present

## 2023-08-21 DIAGNOSIS — I330 Acute and subacute infective endocarditis: Secondary | ICD-10-CM

## 2023-08-21 DIAGNOSIS — I059 Rheumatic mitral valve disease, unspecified: Secondary | ICD-10-CM | POA: Diagnosis not present

## 2023-08-21 DIAGNOSIS — I340 Nonrheumatic mitral (valve) insufficiency: Secondary | ICD-10-CM | POA: Diagnosis not present

## 2023-08-21 DIAGNOSIS — I5033 Acute on chronic diastolic (congestive) heart failure: Secondary | ICD-10-CM

## 2023-08-21 HISTORY — DX: Acute respiratory failure with hypoxia: J96.01

## 2023-08-21 HISTORY — DX: Acute and subacute infective endocarditis: I33.0

## 2023-08-21 HISTORY — DX: Other malaise: R53.81

## 2023-08-21 LAB — COOXEMETRY PANEL
Carboxyhemoglobin: 1.7 % — ABNORMAL HIGH (ref 0.5–1.5)
Methemoglobin: 1.8 % — ABNORMAL HIGH (ref 0.0–1.5)
O2 Saturation: 71.1 %
Total hemoglobin: 8.1 g/dL — ABNORMAL LOW (ref 12.0–16.0)

## 2023-08-21 LAB — HEPARIN LEVEL (UNFRACTIONATED)
Heparin Unfractionated: >1.1 [IU]/mL — ABNORMAL HIGH (ref 0.30–0.70)
Heparin Unfractionated: 0.36 [IU]/mL (ref 0.30–0.70)

## 2023-08-21 LAB — MAGNESIUM: Magnesium: 1.8 mg/dL (ref 1.7–2.4)

## 2023-08-21 LAB — CBC
HCT: 23.6 % — ABNORMAL LOW (ref 36.0–46.0)
Hemoglobin: 7.5 g/dL — ABNORMAL LOW (ref 12.0–15.0)
MCH: 28.3 pg (ref 26.0–34.0)
MCHC: 31.8 g/dL (ref 30.0–36.0)
MCV: 89.1 fL (ref 80.0–100.0)
Platelets: 329 10*3/uL (ref 150–400)
RBC: 2.65 MIL/uL — ABNORMAL LOW (ref 3.87–5.11)
RDW: 14.9 % (ref 11.5–15.5)
WBC: 7.5 10*3/uL (ref 4.0–10.5)
nRBC: 0 % (ref 0.0–0.2)

## 2023-08-21 LAB — BASIC METABOLIC PANEL WITH GFR
Anion gap: 10 (ref 5–15)
BUN: 29 mg/dL — ABNORMAL HIGH (ref 8–23)
CO2: 27 mmol/L (ref 22–32)
Calcium: 8 mg/dL — ABNORMAL LOW (ref 8.9–10.3)
Chloride: 95 mmol/L — ABNORMAL LOW (ref 98–111)
Creatinine, Ser: 1.05 mg/dL — ABNORMAL HIGH (ref 0.44–1.00)
GFR, Estimated: 59 mL/min — ABNORMAL LOW (ref >60)
Glucose, Bld: 152 mg/dL — ABNORMAL HIGH (ref 70–99)
Potassium: 4.5 mmol/L (ref 3.5–5.1)
Sodium: 132 mmol/L — ABNORMAL LOW (ref 135–145)

## 2023-08-21 LAB — GLUCOSE, CAPILLARY
Glucose-Capillary: 106 mg/dL — ABNORMAL HIGH (ref 70–99)
Glucose-Capillary: 141 mg/dL — ABNORMAL HIGH (ref 70–99)
Glucose-Capillary: 147 mg/dL — ABNORMAL HIGH (ref 70–99)
Glucose-Capillary: 201 mg/dL — ABNORMAL HIGH (ref 70–99)
Glucose-Capillary: 353 mg/dL — ABNORMAL HIGH (ref 70–99)
Glucose-Capillary: 99 mg/dL (ref 70–99)

## 2023-08-21 LAB — IRON AND TIBC
Iron: 28 ug/dL (ref 28–170)
Saturation Ratios: 15 % (ref 10.4–31.8)
TIBC: 185 ug/dL — ABNORMAL LOW (ref 250–450)
UIBC: 157 ug/dL

## 2023-08-21 LAB — FERRITIN: Ferritin: 578 ng/mL — ABNORMAL HIGH (ref 11–307)

## 2023-08-21 LAB — PROTIME-INR
INR: 1.8 — ABNORMAL HIGH (ref 0.8–1.2)
Prothrombin Time: 20.8 s — ABNORMAL HIGH (ref 11.4–15.2)

## 2023-08-21 LAB — PREPARE RBC (CROSSMATCH)

## 2023-08-21 MED ORDER — ACETAZOLAMIDE 250 MG PO TABS
500.0000 mg | ORAL_TABLET | Freq: Two times a day (BID) | ORAL | Status: AC
  Administered 2023-08-21 (×2): 500 mg via ORAL
  Filled 2023-08-21 (×2): qty 2

## 2023-08-21 MED ORDER — INSULIN GLARGINE-YFGN 100 UNIT/ML ~~LOC~~ SOLN
30.0000 [IU] | Freq: Two times a day (BID) | SUBCUTANEOUS | Status: DC
  Administered 2023-08-21 – 2023-08-27 (×11): 30 [IU] via SUBCUTANEOUS
  Filled 2023-08-21 (×13): qty 0.3

## 2023-08-21 MED ORDER — WARFARIN SODIUM 2 MG PO TABS
6.0000 mg | ORAL_TABLET | Freq: Once | ORAL | Status: AC
  Administered 2023-08-21: 6 mg
  Filled 2023-08-21: qty 3

## 2023-08-21 MED ORDER — POTASSIUM CHLORIDE 20 MEQ PO PACK
20.0000 meq | PACK | Freq: Once | ORAL | Status: AC
  Administered 2023-08-21: 20 meq
  Filled 2023-08-21: qty 1

## 2023-08-21 MED ORDER — SODIUM CHLORIDE 0.9% FLUSH: 3.0000 mL | INTRAVENOUS | Status: DC | PRN

## 2023-08-21 MED ORDER — SODIUM CHLORIDE 0.9% FLUSH
3.0000 mL | Freq: Two times a day (BID) | INTRAVENOUS | Status: DC
Start: 2023-08-21 — End: 2023-08-22
  Administered 2023-08-21: 10 mL via INTRAVENOUS

## 2023-08-21 MED ORDER — SODIUM CHLORIDE 0.9% IV SOLUTION: Freq: Once | INTRAVENOUS | Status: DC

## 2023-08-21 MED ORDER — ENSURE ENLIVE PO LIQD
237.0000 mL | Freq: Two times a day (BID) | ORAL | Status: DC
  Administered 2023-08-21 – 2023-08-28 (×13): 237 mL via ORAL
  Filled 2023-08-21 (×2): qty 237

## 2023-08-21 MED ORDER — MAGNESIUM SULFATE 2 GM/50ML IV SOLN
2.0000 g | Freq: Once | INTRAVENOUS | Status: AC
  Administered 2023-08-21: 2 g via INTRAVENOUS
  Filled 2023-08-21: qty 50

## 2023-08-21 NOTE — Progress Notes (Signed)
 PHARMACY - ANTICOAGULATION CONSULT NOTE  Pharmacy Consult for warfarin + heparin  Indication: onX mMVR  No Known Allergies  Patient Measurements: Height: 5\' 5"  (165.1 cm) Weight: 118.1 kg (260 lb 5.8 oz) IBW/kg (Calculated) : 57 HEPARIN  DW (KG): 84.3  Vital Signs: Temp: 98.8 F (37.1 C) (05/22 0400) Temp Source: Oral (05/22 0400) BP: 92/61 (05/22 0600) Pulse Rate: 136 (05/22 0700)  Labs: Recent Labs    08/19/23 0426 08/20/23 0434 08/20/23 1716 08/21/23 0510 08/21/23 0710  HGB  --   --   --  7.5*  --   HCT  --   --   --  23.6*  --   PLT  --   --   --  329  --   LABPROT 23.6* 18.4*  --  20.8*  --   INR 2.1* 1.5*  --  1.8*  --   HEPARINUNFRC  --   --  0.35 >1.10* 0.36  CREATININE 0.96 1.04*  --  1.05*  --     Estimated Creatinine Clearance: 69.6 mL/min (A) (by C-G formula based on SCr of 1.05 mg/dL (H)).   Medical History: Past Medical History:  Diagnosis Date   Anxiety    Breast cancer (HCC)    Diabetes mellitus without complication (HCC)    Hyperlipidemia    Hypertension    Type 2 diabetes mellitus without retinopathy (HCC) 06/17/2017    Medications:  Scheduled:   anastrozole   1 mg Per Tube Daily   aspirin   81 mg Per Tube Daily   Chlorhexidine  Gluconate Cloth  6 each Topical Daily   digoxin  0.125 mg Per Tube Daily   feeding supplement (PROSource TF20)  60 mL Per Tube Daily   fiber supplement (BANATROL TF)  60 mL Per Tube QID   insulin  aspart  0-24 Units Subcutaneous Q4H   insulin  aspart  3 Units Subcutaneous Q4H   insulin  glargine-yfgn  20 Units Subcutaneous BID   pantoprazole  (PROTONIX ) IV  40 mg Intravenous Daily   sodium chloride  flush  10-40 mL Intracatheter Q12H   spironolactone   25 mg Per Tube Daily   Warfarin - Pharmacist Dosing Inpatient   Does not apply q1600    Assessment: 42 yof presenting with acute mitral valve endocarditis with perforated mitral leaflet and severe MR. Underwent dental extraction 5/8. Presented for pre-op RHC and  developed flash pulmonary edema requiring intubation - IABP was placed in lab and IV heparin  started. No AC PTA.  Pt now s/p cardiac surgery with onX mMVR placed. Pharmacy consulted to start warfarin 5/16 after IABP removal 5/15. INR today dropped to 1.5 with TEE/DCCV planned later this week so IV heparin  started (ok with surgery since 7 days postop).   Heparin  level via peripheral stick is therapeutic at 0.36 on 1400 units/h. INR is up to 1.8. H/H down slightly, pltc wnl. No bleeding concerns overnight.  Goal of Therapy:  INR 2.5-3.5 Heparin  level 0.3-0.5 Monitor platelets by anticoagulation protocol: Yes   Plan:  Warfarin 6mg  x1 tonight Heparin  1400 units/h Heparin  level, INR, CBC daily   Levin Reamer, PharmD, BCPS, [facility] Clinical Pharmacist [phone] Please check AMION for all [facility] Pharmacy numbers 08/21/2023

## 2023-08-21 NOTE — Progress Notes (Addendum)
 Physical Therapy Treatment Patient Details Name: Melissa Bray MRN: [id number] DOB: 06-26-1960 Today's Date: 08/21/2023   History of Present Illness 63 y.o. female presents to [facility] 07/31/23 from [place]. Originally diagnosed with sepsis 2/2 LUE cellulitis with transfer to [facility] after murmur found. Pt with severe mitral regurgitation 2/2 endocarditis. 5/5 TEE. 5/8 extraction of teeth. 5/9 quick deterioration 2/2 flash pulmonary edema and cardiogenic shock before LHC was performed. 5/9-5/12 intubated. 5/9-5/15 IABP. 5/13 MVR. PMHx: T2DM, HTN, obesity, GAD, sinus tachycardia   PT Comments  Pt in bed upon arrival and agreeable to PT session. Pt was eager for mobility this date and improved by needing less assistance to stand and by increasing gait distance. Pt was able to stand with MinAx2 and able to ambulate 42ft with RW and MinAx2. Assistance needed for line management and slight steadying assist with close chair follow. Pt continues to need ModAx2/MaxA for bed mobility with cues for technique. Pt is progressing well towards goals. Continue to recommend >3hrs post acute rehab to maximize rehab potential. Acute PT to follow.   114-130 BPM >94% SpO2 on 1L     If plan is discharge home, recommend the following: A lot of help with walking and/or transfers;A lot of help with bathing/dressing/bathroom;Assistance with cooking/housework;Assist for transportation;Help with stairs or ramp for entrance   Can travel by private vehicle      No  Equipment Recommendations  Rolling walker (2 wheels);BSC/3in1    Recommendations for Other Services Rehab consult     Precautions / Restrictions Precautions Precautions: Sternal;Fall Recall of Precautions/Restrictions: Impaired Precaution/Restrictions Comments: NG tube feed, catheter Restrictions Other Position/Activity Restrictions: sternal     Mobility  Bed Mobility Overal bed mobility: Needs Assistance Bed Mobility: Sidelying to Sit, Sit to Sidelying,  Rolling Rolling: Mod assist Sidelying to sit: Mod assist, +2 for physical assistance    Sit to sidelying: Max assist, +2 for safety/equipment General bed mobility comments: able to bring LE's off EOB, ModA to roll and ModAx2 to raise trunk. MaxAx2 for safety for LE management for return to sidelying    Transfers Overall transfer level: Needs assistance Equipment used: Rolling walker (2 wheels) Transfers: Sit to/from Stand Sit to Stand: Min assist, +2 physical assistance    General transfer comment: MinAx2 to boost-up with pt's hands on knees. Used momentum    Ambulation/Gait Ambulation/Gait assistance: Min assist, +2 safety/equipment Gait Distance (Feet): 30 Feet Assistive device: Rolling walker (2 wheels) Gait Pattern/deviations: Step-through pattern, Decreased stride length Gait velocity: decr     General Gait Details: MinA for slight steadying assist with x2 for line management and chair follow. x2 standing rest breaks due to fatigue.     Balance Overall balance assessment: Needs assistance Sitting-balance support: No upper extremity supported, Feet supported Sitting balance-Leahy Scale: Good     Standing balance support: Reliant on assistive device for balance Standing balance-Leahy Scale: Poor Standing balance comment: reliant on external support       Communication Communication Communication: No apparent difficulties Factors Affecting Communication: Reduced clarity of speech (soft spoken)  Cognition Arousal: Alert Behavior During Therapy: WFL for tasks assessed/performed   PT - Cognitive impairments: No apparent impairments    Following commands: Intact      Cueing Cueing Techniques: Verbal cues, Tactile cues  Exercises General Exercises - Lower Extremity Heel Slides: AROM, Both, Supine (2 reps) Hip ABduction/ADduction: AROM, Both, Supine (2 reps) Straight Leg Raises: AROM, Both, 5 reps, Supine        Pertinent Vitals/Pain Pain Assessment  Pain  Assessment: Faces Faces Pain Scale: Hurts little more Pain Location: chest Pain Descriptors / Indicators: Aching, Discomfort Pain Intervention(s): Limited activity within patient's tolerance, Monitored during session, Repositioned     PT Goals (current goals can now be found in the care plan section) Acute Rehab PT Goals PT Goal Formulation: With patient Time For Goal Achievement: 08/31/23 Potential to Achieve Goals: Good Progress towards PT goals: Progressing toward goals    Frequency    Min 2X/week           Co-evaluation   Reason for Co-Treatment: For patient/therapist safety;To address functional/ADL transfers PT goals addressed during session: Mobility/safety with mobility;Balance;Proper use of DME OT goals addressed during session: ADL's and self-care      AM-PAC PT "6 Clicks" Mobility   Outcome Measure  Help needed turning from your back to your side while in a flat bed without using bedrails?: A Lot Help needed moving from lying on your back to sitting on the side of a flat bed without using bedrails?: A Lot Help needed moving to and from a bed to a chair (including a wheelchair)?: A Lot Help needed standing up from a chair using your arms (e.g., wheelchair or bedside chair)?: A Lot Help needed to walk in hospital room?: A Little Help needed climbing 3-5 steps with a railing? : Total 6 Click Score: 12    End of Session Equipment Utilized During Treatment: Oxygen Activity Tolerance: Patient tolerated treatment well Patient left: in bed;with call bell/phone within reach Nurse Communication: Mobility status PT Visit Diagnosis: Unsteadiness on feet (R26.81);Other abnormalities of gait and mobility (R26.89);Muscle weakness (generalized) (M62.81)     Time: 1610-9604 PT Time Calculation (min) (ACUTE ONLY): 32 min  Charges:    $Gait Training: 8-22 mins PT General Charges $$ ACUTE PT VISIT: 1 Visit                    Orysia Blas, PT, DPT Secure Chat Preferred   Rehab Office [phone]    Alissa April Adela Ades 08/21/2023, 2:58 PM

## 2023-08-21 NOTE — Progress Notes (Signed)
 [address]       [place] [address]             [phone]      8 Days Post-Op Procedure(s) (LRB): MITRAL VALVE REPLACEMENT USING ON-X PROSTHETIC MITRAL HEART VALVE SIZE 31/33MM (N/A) ECHOCARDIOGRAM, TRANSESOPHAGEAL, INTRAOPERATIVE (N/A) CLIPPING, LEFT ATRIAL APPENDAGE USING ATRICURE LAA EXCLUSION SYSTEM SIZE 40 Subjective: Patient states "I feel good today." She denies pain and shortness of breath. Tolerated some breakfast without nausea and vomiting.   Objective: Vital signs in last 24 hours: Temp:  [97 F (36.1 C)-98.8 F (37.1 C)] 98.8 F (37.1 C) (05/22 0400) Pulse Rate:  [128-139] 136 (05/22 0700) Cardiac Rhythm: Atrial flutter (05/22 0400) Resp:  [8-41] 19 (05/22 0700) BP: (87-120)/(51-84) 92/61 (05/22 0600) SpO2:  [91 %-100 %] 100 % (05/22 0700) Weight:  [118.1 kg] 118.1 kg (05/22 0500)  Hemodynamic parameters for last 24 hours: CVP:  [7 mmHg-19 mmHg] 12 mmHg  Intake/Output from previous day: 05/21 0701 - 05/22 0700 In: 3388.4 [I.V.:1015.3; NG/GT:1560; IV Piggyback:813.2] Out: 5850 [Urine:5810; Stool:40] Intake/Output this shift: No intake/output data recorded.  General appearance: alert, cooperative, and no distress Neurologic: intact Heart: atrial flutter, aortic valve click, no murmur or rub Lungs: diminished bibasilar breath sounds Abdomen: soft, non-tender; bowel sounds normal; no masses,  no organomegaly Extremities: edema trace Wound: Clean and dry without sign of infection  Lab Results: Recent Labs    08/21/23 0510  WBC 7.5  HGB 7.5*  HCT 23.6*  PLT 329   BMET:  Recent Labs    08/20/23 0434 08/21/23 0510  NA 133* 132*  K 3.5 4.5  CL 96* 95*  CO2 29 27  GLUCOSE 89 152*  BUN 24* 29*  CREATININE 1.04* 1.05*  CALCIUM  8.3* 8.0*    PT/INR:  Recent Labs    08/21/23 0510  LABPROT 20.8*  INR 1.8*   ABG    Component Value Date/Time   PHART 7.465 (H) 08/14/2023 1700   HCO3 25.0 08/14/2023 1700   TCO2 26  08/14/2023 1700   ACIDBASEDEF 1.0 08/08/2023 1634   O2SAT 71.1 08/21/2023 0510   CBG (last 3)  Recent Labs    08/20/23 2023 08/21/23 0004 08/21/23 0405  GLUCAP 176* 201* 106*    Assessment/Plan: S/P Procedure(s) (LRB): MITRAL VALVE REPLACEMENT USING ON-X PROSTHETIC MITRAL HEART VALVE SIZE 31/33MM (N/A) ECHOCARDIOGRAM, TRANSESOPHAGEAL, INTRAOPERATIVE (N/A) CLIPPING, LEFT ATRIAL APPENDAGE USING ATRICURE LAA EXCLUSION SYSTEM SIZE 40  Neuro: Pain controled this AM   CV: Atrial flutter with RVR, HR 130s. On Amiodarone  gtt. SBP 90-100. Plan for DC-CV Friday. On Milrinone  0.125, Spironolactone  25mg  daily, and Digoxin 0.125mg  daily. AHF team is following, appreciate their assistance. Coox 71.1. Milrinone  weaned per AHF team.    Pulm: Saturating 100% on 2L [place]. Last CXR with bibasilar atelectasis. Encourage IS and ambulation   GI: +BM, Cortrak in place. Tube feeds at goal. Tolerating diet this AM. Denies nausea, vomiting. Kcal count today, may be able to d/c cortrak soon since tolerating a diet.    Endo: CBGs 176/201/106. Better controlled on Semglee  35U BID, Novolog  3U Q4H and SSI PRN.    Renal: Cr 1.05, slightly elevated likely due to aggressive diuresis. On Lasix  drip per AHF. UO 5810cc/24hrs. At preop weight. K 4.5, at goal Hyponatremia stable.    ID: Endocarditis, on Penicillin  G. ID recommending IB PCN G for 6 weeks starting from 05/14, end date of 06/25. They have signed off but will arrange outpatient follow  up at discharge. Tmax 99.1, no leukocytosis.   Expected postop ABLA: H/H 7.5/23.6, nearing transfusion threshold. Will discuss transfusion with surgeon.    DVT Prophylaxis: Coumadin    Deconditioning: Continue work with PT/OT. Plan for CIR at discharge.   Anticoagulation: On Coumadin , INR 1.8. Received 3mg  yesterday. Coumadin  dosing per pharmacy. Goal 2.5-3.5. On IV heparin  until INR is closer to goal.    Dispo: Continue ICU care   LOS: 19 days    Randa Burton,  PA-C 08/21/2023

## 2023-08-21 NOTE — H&P (View-Only) (Signed)
 Patient ID: Melissa Bray, female   DOB: January 02, 1961, 63 y.o.   MRN: [id number]     Advanced Heart Failure Rounding Note  Cardiologist: Eilleen Grates, MD   Chief Complaint: CHF  Subjective:    63 y/o obese woman admitted with cardiogenic shock in setting of severe MR due to endocarditis and perforation of anterior MV leaflet. Underwent mMVR on 08/14/23. Post-op course c/b RV Failure  POD#8 s/p MVR  Continues to diurese well w/ IV diuretics, 5.8L in UOP. C/w volume overload. CVP 8. Co-ox 71% on Milrinone  0.125.   Scr stable, 1.0, K 4.5, Mg 1.8. Continues in AFL w/ RVR, 130s. SBPs soft, 90s.  INR 1.8. On heparin  gtt. Hgb lower, 7.5 today. No gross bleeding   Overally feeling better. Breathing improving. No current resting dyspnea.    Objective:    Weight Range: 118.1 kg Body mass index is 43.33 kg/m.   Vital Signs:   Temp:  [97 F (36.1 C)-98.9 F (37.2 C)] 98.9 F (37.2 C) (05/22 0700) Pulse Rate:  [128-139] 135 (05/22 0830) Resp:  [8-41] 15 (05/22 0830) BP: (87-120)/(51-84) 94/60 (05/22 0800) SpO2:  [91 %-100 %] 100 % (05/22 0830) Weight:  [118.1 kg] 118.1 kg (05/22 0500) Last BM Date : 08/20/23  Weight change: Filed Weights   08/19/23 0500 08/20/23 0500 08/21/23 0500  Weight: 120.1 kg 118.3 kg 118.1 kg   Intake/Output:  Intake/Output Summary (Last 24 hours) at 08/21/2023 0904 Last data filed at 08/21/2023 0800 Gross per 24 hour  Intake 3209.51 ml  Output 5535 ml  Net -2325.49 ml    Physical Exam   CVP 8  General: obese, sitting up in bed. No respiratory difficulty HEENT: normal + cortrak  Neck: supple. JVD 8-9 cm.  Cor: Irregularly irregular  rate & rhythm. + crisp mechanical valve sounds  Lungs: clear Abdomen: soft, nontender, nondistended. No hepatosplenomegaly. No bruits or masses. Good bowel sounds. Extremities: no cyanosis, clubbing, rash, edema Neuro: alert & oriented x 3, cranial nerves grossly intact. moves all 4 extremities w/o difficulty. Affect  pleasant.    Telemetry   AFL 130s-140s, personally reviewed.  Labs    CBC Recent Labs    08/21/23 0510  WBC 7.5  HGB 7.5*  HCT 23.6*  MCV 89.1  PLT 329   Basic Metabolic Panel Recent Labs    40/98/11 0425 08/19/23 0426 08/20/23 0434 08/21/23 0510  NA  --    < > 133* 132*  K  --    < > 3.5 4.5  CL  --    < > 96* 95*  CO2  --    < > 29 27  GLUCOSE  --    < > 89 152*  BUN  --    < > 24* 29*  CREATININE  --    < > 1.04* 1.05*  CALCIUM   --    < > 8.3* 8.0*  MG  --    < > 2.0 1.8  PHOS 3.8  --   --   --    < > = values in this interval not displayed.   Imaging   No results found.    Medications:    Scheduled Medications:  sodium chloride    Intravenous Once   acetaZOLAMIDE   500 mg Oral BID   anastrozole   1 mg Per Tube Daily   aspirin   81 mg Per Tube Daily   Chlorhexidine  Gluconate Cloth  6 each Topical Daily   digoxin  0.125 mg Per Tube Daily  feeding supplement (PROSource TF20)  60 mL Per Tube Daily   fiber supplement (BANATROL TF)  60 mL Per Tube QID   insulin  aspart  0-24 Units Subcutaneous Q4H   insulin  aspart  3 Units Subcutaneous Q4H   insulin  glargine-yfgn  20 Units Subcutaneous BID   pantoprazole  (PROTONIX ) IV  40 mg Intravenous Daily   sodium chloride  flush  10-40 mL Intracatheter Q12H   spironolactone   25 mg Per Tube Daily   warfarin  6 mg Per Tube ONCE-1600   Warfarin - Pharmacist Dosing Inpatient   Does not apply q1600   Infusions:  amiodarone  30 mg/hr (08/21/23 0800)   feeding supplement (VITAL AF 1.2 CAL) 60 mL/hr at 08/21/23 0800   furosemide  (LASIX ) 200 mg in dextrose  5 % 100 mL (2 mg/mL) infusion 20 mg/hr (08/21/23 0800)   heparin  1,400 Units/hr (08/21/23 0800)   magnesium  sulfate bolus IVPB     milrinone  0.125 mcg/kg/min (08/21/23 0800)   penicillin  G potassium 12 Million Units in sodium chloride  0.9 % 500 mL CONTINUOUS infusion 41.7 mL/hr at 08/21/23 0800   PRN Medications: clonazePAM , dextrose , melatonin, morphine  injection,  ondansetron  (ZOFRAN ) IV, mouth rinse, oxyCODONE , prochlorperazine , senna-docusate, sodium chloride  flush, traMADol   Assessment/Plan   1. Cardiogenic shock: Valvular shock from severe MR.  TEE on 5/5 showed LV EF 65%, normal RV function, severe MR with perforated leaflet and mobile vegetation.  Patient developed flash pulmonary edema 5/9 in setting of marked hypertension -> IABP placed. - s/p mMVR 08/14/23 - POCUS ECHO 5/15 EF 60% RV mod reduced - Diuresing well on lasix  gtt. Volume status improving, though remains mildly volume overloaded.  - Continue Lasix  gtt @20 /hr + Diamox  500 mg bid  - Continue milrinone  0.125 through today to help w/ diuresis. Stop at 8pm tonight, ahead of DCCV  - Continue spironolactone  25 mg daily - Continue digoxin 0.125 mg daily   2. Mitral regurgitation: Severe on TEE with perforated anterior leaflet and mitral valve vegetation.  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  - s/p MVR with Dr. Luna Salinas 08/14/23 - post-op antibiotics per ID. On PenG, will require 6 weeks IV abx (start date 05/14). - INR 1.8 Warfarin / heparin  bridging per CT surgery   3. Acute hypercarbic/hypoxemic respiratory failure in setting of #1&2 - Resolved - Encouraged IS   4. Atrial flutter with RVR - Continue IV amiodarone  at 30/hr - Plan TEE/DCCV tomorrow. Will stop milrinone  prior. Getting continuous TFs. Will need to hold at midnight   - Warfarin/ heparin  gtt as above  5. Morbid obesity - Body mass index is 43.33 kg/m.  6. Hypervolemic Hyponatremia - improving w/ diuresis, Na 132 today  - diuretics per above  - restrict free water  7. Hypomagnesemia - Mg 1.8, give IV K supp  8. Anemia - Hgb down to 7.5 today, no gross bleeding - transfuse 1uRBCs - check iron stores prior to transfusion. Will give IV Fe if indicated    Length of Stay: [address], PA-C  08/21/2023, 9:04 AM  Advanced Heart Failure Team Pager [phone] (M-F; 7a - 5p)   Please contact [facility] for night-coverage after hours (5p -7a ) and weekends on [email or web address]

## 2023-08-21 NOTE — Plan of Care (Signed)

## 2023-08-21 NOTE — Progress Notes (Signed)
 IP rehab admissions - patient tells me that she is scheduled for a cardioversion tomorrow.  Continues on IV drips as well.  Not medically ready for inpatient rehab at this time.  [phone]

## 2023-08-21 NOTE — Progress Notes (Signed)
 Occupational Therapy Treatment Patient Details Name: Melissa Bray MRN: [id number] DOB: 11-11-1960 Today's Date: 08/21/2023   History of present illness 63 y.o. female presents to [facility] 07/31/23 from [place]. Originally diagnosed with sepsis 2/2 LUE cellulitis with transfer to [facility] after murmur found. Pt with severe mitral regurgitation 2/2 endocarditis. 5/5 TEE. 5/8 extraction of teeth. 5/9 quick deterioration 2/2 flash pulmonary edema and cardiogenic shock before LHC was performed. 5/9-5/12 intubated. 5/9-5/15 IABP. 5/13 MVR. PMHx: T2DM, HTN, obesity, GAD, sinus tachycardia   OT comments  Patient with good progress toward patient focused goals.  Patient moving better, still needing up to Mod A of 2 for bed mobility, but her technique and adherence to sternal precautions has improved.  Sit to stand is a light Min A of two due to lines and leeds, and lower body ADL has progressed to max A, but now from sit to stand level.  Deficits impacting independence are listed below.  OT will continue efforts in the acute setting to maximize function, and Patient will benefit from intensive inpatient follow-up therapy, >3 hours/day.      If plan is discharge home, recommend the following:  A lot of help with bathing/dressing/bathroom;A lot of help with walking and/or transfers   Equipment Recommendations  Tub/shower seat;BSC/3in1    Recommendations for Other Services      Precautions / Restrictions Precautions Precautions: Sternal;Fall Precaution/Restrictions Comments: NG tube feed, catheter Restrictions Weight Bearing Restrictions Per Provider Order: Yes Other Position/Activity Restrictions: sternal       Mobility Bed Mobility Overal bed mobility: Needs Assistance Bed Mobility: Sidelying to Sit, Sit to Sidelying   Sidelying to sit: Mod assist, +2 for physical assistance     Sit to sidelying: Max assist, +2 for physical assistance   Patient Response: Cooperative  Transfers Overall  transfer level: Needs assistance Equipment used: Rolling walker (2 wheels) Transfers: Sit to/from Stand, Bed to chair/wheelchair/BSC Sit to Stand: Min assist, +2 physical assistance     Step pivot transfers: Min assist, Contact guard assist           Balance Overall balance assessment: Needs assistance Sitting-balance support: No upper extremity supported, Feet supported Sitting balance-Leahy Scale: Good     Standing balance support: Reliant on assistive device for balance Standing balance-Leahy Scale: Poor                             ADL either performed or assessed with clinical judgement   ADL   Eating/Feeding: Set up;Bed level   Grooming: Wash/dry hands;Wash/dry face;Set up;Sitting   Upper Body Bathing: Minimal assistance;Sitting   Lower Body Bathing: Maximal assistance;Sitting/lateral leans;+2 for safety/equipment;Sit to/from stand   Upper Body Dressing : Maximal assistance;Sitting   Lower Body Dressing: Maximal assistance;Sit to/from stand;Total assistance;+2 for safety/equipment;Sitting/lateral leans   Toilet Transfer: +2 for physical assistance;Stand-pivot;Minimal assistance;BSC/3in1                  Extremity/Trunk Assessment Upper Extremity Assessment Upper Extremity Assessment: Overall WFL for tasks assessed   Lower Extremity Assessment Lower Extremity Assessment: Defer to PT evaluation   Cervical / Trunk Assessment Cervical / Trunk Assessment: Kyphotic    Vision Baseline Vision/History: 1 Wears glasses Patient Visual Report: No change from baseline     Perception Perception Perception: Not tested   Praxis Praxis Praxis: Not tested   Communication Communication Communication: No apparent difficulties   Cognition Arousal: Alert Behavior During Therapy: WFL for tasks assessed/performed Cognition: No  apparent impairments                               Following commands: Intact        Cueing   Cueing  Techniques: Verbal cues, Tactile cues  Exercises      Shoulder Instructions       General Comments      Pertinent Vitals/ Pain       Pain Assessment Pain Assessment: Faces Faces Pain Scale: Hurts little more Pain Location: chest Pain Descriptors / Indicators: Aching, Discomfort Pain Intervention(s): Monitored during session                                                          Frequency  Min 2X/week        Progress Toward Goals  OT Goals(current goals can now be found in the care plan section)  Progress towards OT goals: Progressing toward goals  Acute Rehab OT Goals OT Goal Formulation: With patient Time For Goal Achievement: 09/01/23 Potential to Achieve Goals: Good  Plan      Co-evaluation    PT/OT/SLP Co-Evaluation/Treatment: Yes Reason for Co-Treatment: For patient/therapist safety;To address functional/ADL transfers   OT goals addressed during session: ADL's and self-care      AM-PAC OT "6 Clicks" Daily Activity     Outcome Measure   Help from another person eating meals?: A Little Help from another person taking care of personal grooming?: A Little Help from another person toileting, which includes using toliet, bedpan, or urinal?: A Lot Help from another person bathing (including washing, rinsing, drying)?: A Lot Help from another person to put on and taking off regular upper body clothing?: A Lot Help from another person to put on and taking off regular lower body clothing?: A Lot 6 Click Score: 14    End of Session Equipment Utilized During Treatment: Oxygen;Rolling walker (2 wheels)  OT Visit Diagnosis: Unsteadiness on feet (R26.81);Muscle weakness (generalized) (M62.81)   Activity Tolerance Patient limited by fatigue   Patient Left in bed;with call bell/phone within reach;with bed alarm set   Nurse Communication Mobility status        Time: 1610-9604 OT Time Calculation (min): 27 min  Charges: OT  General Charges $OT Visit: 1 Visit OT Treatments $Self Care/Home Management : 8-22 mins  08/21/2023  RP, OTR/L  Acute Rehabilitation Services  Office:  [phone]   Melissa Bray 08/21/2023, 2:46 PM

## 2023-08-21 NOTE — Progress Notes (Signed)
 NAME:  Melissa Bray, MRN:  [id number], DOB:  05-24-1960, LOS: 19 ADMISSION DATE:  07/31/2023, CONSULTATION DATE:  5/9 REFERRING MD:  Mitzie Anda, CHIEF COMPLAINT:  respiratory failure   History of Present Illness:  Melissa Bray is a 63 y/o woman with a history of DM, HTN who presented on 5/1 with cellulitis of her LUE and sepsis. At presentation she complained of fevers, generalized weakness, and reduced PO intake. During her evaluation she was diagnosed with GBS bacteremia and mitral valve endocarditis with leaflet perforation. She required dental extractions for dental carries. Plans have been in place for mitral valve repair by TCTS. She was planned for [facility] today; after coming to the cath lab and going through timeout procedure she was moved to the cath lab table. She very quickly got distressed lying back, trying to sit up. She was placed on bipap for respiratory distress. She suddenly went unresponsive during this time without ongoing agitation. No sedation was given. PCCM was consulted in cath lab for emergent management of respiratory failure.    Pertinent  Medical History  MD HTN HLD Breast cancer, s/p left mastectomy  Significant Hospital Events: Including procedures, antibiotic start and stop dates in addition to other pertinent events   5/1 Admitted  5/9 deteriorated due to flash pulmonary edema, intubated, IABP. 5/12 Extubated  5/13 Remains off vent, overnight needing levophed  gtt restarted but weaning off  5/14 plan for mitral valve replacement, still on IABP 1:1  5/15 mitral valve replacement, remains on vent post op. Extubated, IABP removed 5/16 hypertensive, required multiple doses of hydralazine .  Remain on milrinone  5/17 remain on milrinone  at 0.25, tachycardic 5/20 lasix  gtt/acetazolamide , milrinone  down 0.125  Interim History / Subjective:  Appetite is returning- she ate some pancakes and sausage with breakfast. She walked to the door yesterday and was up in the chair this  morning. Remains on heparin , amiodarone , lasix , milrinone .   Objective    Physical Exam  General: chronically ill appearing woman lying in bed in NAD HEENT: MM pink/moist, pupils 3/r Neuro: awakens to verbal but falling back asleep in conversation, oriented x 3, MAE CV: rr, rate 129, +click PULM:  non labored, clear, more shallow/ reduced in bases, only able to pull max 400-500 briefly on IS, 4L [place] at 100% GI: obese, +bs, NT, foley  Extremities: warm/dry, Skin: no rashes   I/O -2.5L past 24 hrs, still +3.8L for the admission  Coox 71% Na+  132 BUN 29 Cr 1.05 WBC 7.5 H/H 7.5/23.6 Platelets 329 INR 1.8  Resolved Hospital Problem list :  Acute encephalopathy due to hypercapnia Hyperkalemia, resolved> wonder if this was due to succinylcholine  at intubation Hypomagnesemia thrombocytopenia  Assessment & Plan:  Acute mitral valve Strep agalactiae endocarditis with severe mitral regurgitation, s/p mechanical mitral valve replacement Acute HFrEF with cardiogenic shock on milrinone  Therapeutic AC on warfarin for mechanical MV A-flutter with RVR -post-op care per TCTS -con't aggressive diuresis-- lasix  gtt, diamox  -con't milrinone  0.125 -amiodarone  for Aflutter -con't digoxin, spironolactone  -PCN G for 6 weeks since date of surgery for endocarditis-- stop date 6/25. Reconsult ID once she gets to rehab per their request.  -heparin  until warfarin is therapeutic. Warfarin dosing per PharmD -monitor electrolytes and replete as needed -progress mobility  Group A streptococcus bacteremia  - PCN G per ID x 6 weeks (stop date 6/25); reconsult ID when she gets to rehab  Acute pulmonary edema due to hypertension, improved Acute respiratory failure with hypoxia and hypercapnia -wean O2 to maintain SpO2 >90% -  pulmonary hygiene -OOB mobility  Hypervolemic hyponatremia -diuresis -restrict free water; PCN G was switched to NS now to prevent hypotonic fluids  Poorly controlled  diabetes with hyperglycemia; very high BG after eating small breakfast -semglee  increased to 30 units BID; had been decreased when she came off D5w from PCN continuous infusion -TF coverage -may need to add meal coverage -she will be NPO past midnight for TEE -goal BG 140-180  Anemia due to critical illness, operative blood loss. Not clear why she has dropped in the past 3 days. - monitor CBC; transfuse for Hb <7 or hemodynamically significant bleeding -monitor for bleeding  Morbid obesity BMI 43 - recommend long-term goal of weight loss  Nausea  -PRN antiemetics  Anxiety -clonazepam  PRN  Diarrhea -banatrol 4 times daily -hopefully will improve once off TF, but long-term anitbiotics will make this challenging  At risk for malnutrition -con't cortrak -encouraged her to eat and keep kcal count. Family can bring food fromhome.   H/o breast cancer - anastrazole    Best Practice (right click and "Reselect all SmartList Selections" daily)   Diet/type: EN per RD, oral intake as tolerated DVT prophylaxis Coumadin & heparin  gtt Pressure ulcer(s): Deferred to nursing notes GI prophylaxis: PPI Lines: Central line- picc Foley:  removal ordered  Code Status:  full code Last date of multidisciplinary goals of care discussion- 5/13  Joesph Mussel, DO 08/21/23 1:35 PM [facility]  For contact information, see Amion. If no response to pager, please call PCCM consult pager. After hours, 7PM- 7AM, please call Elink.

## 2023-08-21 NOTE — Progress Notes (Signed)
 Patient ID: Melissa Bray, female   DOB: 12-Sep-1960, 63 y.o.   MRN: [id number]  TCTS Evening Rounds:  Hemodynamically stable, CVP 8 on milrinone  0.125. Sats 96% 1L.  Continues to diurese well on lasix  drip 20.  Transfused this afternoon for Hbg 7.5 this am.  Ambulated in hall today.

## 2023-08-21 NOTE — Progress Notes (Signed)
 Patient ID: Melissa Bray, female   DOB: January 02, 1961, 63 y.o.   MRN: [id number]     Advanced Heart Failure Rounding Note  Cardiologist: Eilleen Grates, MD   Chief Complaint: CHF  Subjective:    63 y/o obese woman admitted with cardiogenic shock in setting of severe MR due to endocarditis and perforation of anterior MV leaflet. Underwent mMVR on 08/14/23. Post-op course c/b RV Failure  POD#8 s/p MVR  Continues to diurese well w/ IV diuretics, 5.8L in UOP. C/w volume overload. CVP 8. Co-ox 71% on Milrinone  0.125.   Scr stable, 1.0, K 4.5, Mg 1.8. Continues in AFL w/ RVR, 130s. SBPs soft, 90s.  INR 1.8. On heparin  gtt. Hgb lower, 7.5 today. No gross bleeding   Overally feeling better. Breathing improving. No current resting dyspnea.    Objective:    Weight Range: 118.1 kg Body mass index is 43.33 kg/m.   Vital Signs:   Temp:  [97 F (36.1 C)-98.9 F (37.2 C)] 98.9 F (37.2 C) (05/22 0700) Pulse Rate:  [128-139] 135 (05/22 0830) Resp:  [8-41] 15 (05/22 0830) BP: (87-120)/(51-84) 94/60 (05/22 0800) SpO2:  [91 %-100 %] 100 % (05/22 0830) Weight:  [118.1 kg] 118.1 kg (05/22 0500) Last BM Date : 08/20/23  Weight change: Filed Weights   08/19/23 0500 08/20/23 0500 08/21/23 0500  Weight: 120.1 kg 118.3 kg 118.1 kg   Intake/Output:  Intake/Output Summary (Last 24 hours) at 08/21/2023 0904 Last data filed at 08/21/2023 0800 Gross per 24 hour  Intake 3209.51 ml  Output 5535 ml  Net -2325.49 ml    Physical Exam   CVP 8  General: obese, sitting up in bed. No respiratory difficulty HEENT: normal + cortrak  Neck: supple. JVD 8-9 cm.  Cor: Irregularly irregular  rate & rhythm. + crisp mechanical valve sounds  Lungs: clear Abdomen: soft, nontender, nondistended. No hepatosplenomegaly. No bruits or masses. Good bowel sounds. Extremities: no cyanosis, clubbing, rash, edema Neuro: alert & oriented x 3, cranial nerves grossly intact. moves all 4 extremities w/o difficulty. Affect  pleasant.    Telemetry   AFL 130s-140s, personally reviewed.  Labs    CBC Recent Labs    08/21/23 0510  WBC 7.5  HGB 7.5*  HCT 23.6*  MCV 89.1  PLT 329   Basic Metabolic Panel Recent Labs    40/98/11 0425 08/19/23 0426 08/20/23 0434 08/21/23 0510  NA  --    < > 133* 132*  K  --    < > 3.5 4.5  CL  --    < > 96* 95*  CO2  --    < > 29 27  GLUCOSE  --    < > 89 152*  BUN  --    < > 24* 29*  CREATININE  --    < > 1.04* 1.05*  CALCIUM   --    < > 8.3* 8.0*  MG  --    < > 2.0 1.8  PHOS 3.8  --   --   --    < > = values in this interval not displayed.   Imaging   No results found.    Medications:    Scheduled Medications:  sodium chloride    Intravenous Once   acetaZOLAMIDE   500 mg Oral BID   anastrozole   1 mg Per Tube Daily   aspirin   81 mg Per Tube Daily   Chlorhexidine  Gluconate Cloth  6 each Topical Daily   digoxin  0.125 mg Per Tube Daily  feeding supplement (PROSource TF20)  60 mL Per Tube Daily   fiber supplement (BANATROL TF)  60 mL Per Tube QID   insulin  aspart  0-24 Units Subcutaneous Q4H   insulin  aspart  3 Units Subcutaneous Q4H   insulin  glargine-yfgn  20 Units Subcutaneous BID   pantoprazole  (PROTONIX ) IV  40 mg Intravenous Daily   sodium chloride  flush  10-40 mL Intracatheter Q12H   spironolactone   25 mg Per Tube Daily   warfarin  6 mg Per Tube ONCE-1600   Warfarin - Pharmacist Dosing Inpatient   Does not apply q1600   Infusions:  amiodarone  30 mg/hr (08/21/23 0800)   feeding supplement (VITAL AF 1.2 CAL) 60 mL/hr at 08/21/23 0800   furosemide  (LASIX ) 200 mg in dextrose  5 % 100 mL (2 mg/mL) infusion 20 mg/hr (08/21/23 0800)   heparin  1,400 Units/hr (08/21/23 0800)   magnesium  sulfate bolus IVPB     milrinone  0.125 mcg/kg/min (08/21/23 0800)   penicillin  G potassium 12 Million Units in sodium chloride  0.9 % 500 mL CONTINUOUS infusion 41.7 mL/hr at 08/21/23 0800   PRN Medications: clonazePAM , dextrose , melatonin, morphine  injection,  ondansetron  (ZOFRAN ) IV, mouth rinse, oxyCODONE , prochlorperazine , senna-docusate, sodium chloride  flush, traMADol   Assessment/Plan   1. Cardiogenic shock: Valvular shock from severe MR.  TEE on 5/5 showed LV EF 65%, normal RV function, severe MR with perforated leaflet and mobile vegetation.  Patient developed flash pulmonary edema 5/9 in setting of marked hypertension -> IABP placed. - s/p mMVR 08/14/23 - POCUS ECHO 5/15 EF 60% RV mod reduced - Diuresing well on lasix  gtt. Volume status improving, though remains mildly volume overloaded.  - Continue Lasix  gtt @20 /hr + Diamox  500 mg bid  - Continue milrinone  0.125 through today to help w/ diuresis. Stop at 8pm tonight, ahead of DCCV  - Continue spironolactone  25 mg daily - Continue digoxin 0.125 mg daily   2. Mitral regurgitation: Severe on TEE with perforated anterior leaflet and mitral valve vegetation.  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  - s/p MVR with Dr. Luna Salinas 08/14/23 - post-op antibiotics per ID. On PenG, will require 6 weeks IV abx (start date 05/14). - INR 1.8 Warfarin / heparin  bridging per CT surgery   3. Acute hypercarbic/hypoxemic respiratory failure in setting of #1&2 - Resolved - Encouraged IS   4. Atrial flutter with RVR - Continue IV amiodarone  at 30/hr - Plan TEE/DCCV tomorrow. Will stop milrinone  prior. Getting continuous TFs. Will need to hold at midnight   - Warfarin/ heparin  gtt as above  5. Morbid obesity - Body mass index is 43.33 kg/m.  6. Hypervolemic Hyponatremia - improving w/ diuresis, Na 132 today  - diuretics per above  - restrict free water  7. Hypomagnesemia - Mg 1.8, give IV K supp  8. Anemia - Hgb down to 7.5 today, no gross bleeding - transfuse 1uRBCs - check iron stores prior to transfusion. Will give IV Fe if indicated    Length of Stay: [address], PA-C  08/21/2023, 9:04 AM  Advanced Heart Failure Team Pager [phone] (M-F; 7a - 5p)   Please contact [facility] for night-coverage after hours (5p -7a ) and weekends on [email or web address]

## 2023-08-22 ENCOUNTER — Inpatient Hospital Stay (HOSPITAL_COMMUNITY): Admitting: Anesthesiology

## 2023-08-22 ENCOUNTER — Encounter (HOSPITAL_COMMUNITY): Payer: Self-pay | Admitting: Thoracic Surgery (Cardiothoracic Vascular Surgery)

## 2023-08-22 ENCOUNTER — Inpatient Hospital Stay (HOSPITAL_COMMUNITY)

## 2023-08-22 ENCOUNTER — Encounter (HOSPITAL_COMMUNITY)
Admission: EM | Disposition: A | Discharge status: Rehabilitation Facility | Payer: Self-pay | Source: Other Acute Inpatient Hospital | Attending: Thoracic Surgery (Cardiothoracic Vascular Surgery)

## 2023-08-22 DIAGNOSIS — I4892 Unspecified atrial flutter: Secondary | ICD-10-CM

## 2023-08-22 DIAGNOSIS — J9601 Acute respiratory failure with hypoxia: Secondary | ICD-10-CM | POA: Diagnosis not present

## 2023-08-22 DIAGNOSIS — I340 Nonrheumatic mitral (valve) insufficiency: Secondary | ICD-10-CM | POA: Diagnosis not present

## 2023-08-22 DIAGNOSIS — R5381 Other malaise: Secondary | ICD-10-CM | POA: Diagnosis not present

## 2023-08-22 DIAGNOSIS — I059 Rheumatic mitral valve disease, unspecified: Secondary | ICD-10-CM | POA: Diagnosis not present

## 2023-08-22 DIAGNOSIS — R570 Cardiogenic shock: Secondary | ICD-10-CM | POA: Diagnosis not present

## 2023-08-22 DIAGNOSIS — I5033 Acute on chronic diastolic (congestive) heart failure: Secondary | ICD-10-CM | POA: Diagnosis not present

## 2023-08-22 HISTORY — PX: CARDIOVERSION: EP1203

## 2023-08-22 HISTORY — PX: TRANSESOPHAGEAL ECHOCARDIOGRAM (CATH LAB): EP1270

## 2023-08-22 LAB — BASIC METABOLIC PANEL WITH GFR
Anion gap: 8 (ref 5–15)
BUN: 30 mg/dL — ABNORMAL HIGH (ref 8–23)
CO2: 28 mmol/L (ref 22–32)
Calcium: 8.4 mg/dL — ABNORMAL LOW (ref 8.9–10.3)
Chloride: 99 mmol/L (ref 98–111)
Creatinine, Ser: 1.05 mg/dL — ABNORMAL HIGH (ref 0.44–1.00)
GFR, Estimated: 59 mL/min — ABNORMAL LOW (ref >60)
Glucose, Bld: 144 mg/dL — ABNORMAL HIGH (ref 70–99)
Potassium: 4.1 mmol/L (ref 3.5–5.1)
Sodium: 135 mmol/L (ref 135–145)

## 2023-08-22 LAB — TYPE AND SCREEN
ABO/RH(D): O POS
Antibody Screen: NEGATIVE
Unit division: 0

## 2023-08-22 LAB — BPAM RBC
Blood Product Expiration Date: 202506172359
ISSUE DATE / TIME: 202505221320
Unit Type and Rh: 5100

## 2023-08-22 LAB — COOXEMETRY PANEL
Carboxyhemoglobin: 1.6 % — ABNORMAL HIGH (ref 0.5–1.5)
Methemoglobin: 0.7 % (ref 0.0–1.5)
O2 Saturation: 63.5 %
Total hemoglobin: 9.9 g/dL — ABNORMAL LOW (ref 12.0–16.0)

## 2023-08-22 LAB — CBC
HCT: 29.4 % — ABNORMAL LOW (ref 36.0–46.0)
Hemoglobin: 9.5 g/dL — ABNORMAL LOW (ref 12.0–15.0)
MCH: 28.8 pg (ref 26.0–34.0)
MCHC: 32.3 g/dL (ref 30.0–36.0)
MCV: 89.1 fL (ref 80.0–100.0)
Platelets: 409 10*3/uL — ABNORMAL HIGH (ref 150–400)
RBC: 3.3 MIL/uL — ABNORMAL LOW (ref 3.87–5.11)
RDW: 14.6 % (ref 11.5–15.5)
WBC: 9.1 10*3/uL (ref 4.0–10.5)
nRBC: 0 % (ref 0.0–0.2)

## 2023-08-22 LAB — PROTIME-INR
INR: 1.6 — ABNORMAL HIGH (ref 0.8–1.2)
Prothrombin Time: 19.4 s — ABNORMAL HIGH (ref 11.4–15.2)

## 2023-08-22 LAB — GLUCOSE, CAPILLARY
Glucose-Capillary: 100 mg/dL — ABNORMAL HIGH (ref 70–99)
Glucose-Capillary: 135 mg/dL — ABNORMAL HIGH (ref 70–99)
Glucose-Capillary: 167 mg/dL — ABNORMAL HIGH (ref 70–99)
Glucose-Capillary: 175 mg/dL — ABNORMAL HIGH (ref 70–99)
Glucose-Capillary: 284 mg/dL — ABNORMAL HIGH (ref 70–99)
Glucose-Capillary: 291 mg/dL — ABNORMAL HIGH (ref 70–99)

## 2023-08-22 LAB — HEPARIN LEVEL (UNFRACTIONATED): Heparin Unfractionated: 0.46 [IU]/mL (ref 0.30–0.70)

## 2023-08-22 SURGERY — TRANSESOPHAGEAL ECHOCARDIOGRAM (TEE) (CATHLAB)
Anesthesia: Monitor Anesthesia Care

## 2023-08-22 MED ORDER — GERHARDT'S BUTT CREAM
TOPICAL_CREAM | Freq: Every day | CUTANEOUS | Status: DC | PRN
Start: 2023-08-22 — End: 2023-08-29
  Filled 2023-08-22: qty 60

## 2023-08-22 MED ORDER — PROPOFOL 10 MG/ML IV BOLUS
INTRAVENOUS | Status: DC | PRN
  Administered 2023-08-22: 20 mg via INTRAVENOUS

## 2023-08-22 MED ORDER — TORSEMIDE 20 MG PO TABS
40.0000 mg | ORAL_TABLET | Freq: Two times a day (BID) | ORAL | Status: DC
  Administered 2023-08-22 – 2023-08-23 (×3): 40 mg via ORAL
  Filled 2023-08-22 (×3): qty 2

## 2023-08-22 MED ORDER — LOPERAMIDE HCL 2 MG PO CAPS
2.0000 mg | ORAL_CAPSULE | Freq: Once | ORAL | Status: AC
  Administered 2023-08-22: 2 mg via ORAL
  Filled 2023-08-22: qty 1

## 2023-08-22 MED ORDER — SODIUM CHLORIDE 0.9 % IV SOLN: INTRAVENOUS | Status: DC | PRN

## 2023-08-22 MED ORDER — GLYCOPYRROLATE PF 0.2 MG/ML IJ SOSY
PREFILLED_SYRINGE | INTRAMUSCULAR | Status: DC | PRN
Start: 2023-08-22 — End: 2023-08-22
  Administered 2023-08-22: .1 mg via INTRAVENOUS

## 2023-08-22 MED ORDER — MIDAZOLAM HCL 2 MG/2ML IJ SOLN
INTRAMUSCULAR | Status: AC
  Filled 2023-08-22: qty 2

## 2023-08-22 MED ORDER — PHENYLEPHRINE HCL-NACL 20-0.9 MG/250ML-% IV SOLN
INTRAVENOUS | Status: DC | PRN
  Administered 2023-08-22: 50 ug/min via INTRAVENOUS

## 2023-08-22 MED ORDER — WARFARIN SODIUM 5 MG PO TABS
7.5000 mg | ORAL_TABLET | Freq: Once | ORAL | Status: AC
  Administered 2023-08-22: 7.5 mg via ORAL
  Filled 2023-08-22: qty 1

## 2023-08-22 MED ORDER — INSULIN ASPART 100 UNIT/ML IJ SOLN
5.0000 [IU] | Freq: Three times a day (TID) | INTRAMUSCULAR | Status: DC
  Administered 2023-08-22 – 2023-08-27 (×10): 5 [IU] via SUBCUTANEOUS

## 2023-08-22 MED ORDER — PROPOFOL 500 MG/50ML IV EMUL
INTRAVENOUS | Status: DC | PRN
  Administered 2023-08-22: 50 ug/kg/min via INTRAVENOUS

## 2023-08-22 MED ORDER — KETAMINE HCL 50 MG/5ML IJ SOSY
PREFILLED_SYRINGE | INTRAMUSCULAR | Status: DC | PRN
  Administered 2023-08-22 (×2): 10 mg via INTRAVENOUS

## 2023-08-22 MED ORDER — LIDOCAINE 2% (20 MG/ML) 5 ML SYRINGE
INTRAMUSCULAR | Status: DC | PRN
  Administered 2023-08-22: 100 mg via INTRAVENOUS

## 2023-08-22 MED ORDER — KETAMINE HCL 50 MG/5ML IJ SOSY
PREFILLED_SYRINGE | INTRAMUSCULAR | Status: AC
  Filled 2023-08-22: qty 5

## 2023-08-22 MED ORDER — SPIRONOLACTONE 25 MG PO TABS
25.0000 mg | ORAL_TABLET | Freq: Every day | ORAL | Status: DC
  Administered 2023-08-23 – 2023-08-24 (×2): 25 mg via ORAL
  Filled 2023-08-22 (×2): qty 1

## 2023-08-22 MED ORDER — ASPIRIN 81 MG PO CHEW
81.0000 mg | CHEWABLE_TABLET | Freq: Every day | ORAL | Status: DC
  Administered 2023-08-23 – 2023-08-25 (×3): 81 mg via ORAL
  Filled 2023-08-22 (×3): qty 1

## 2023-08-22 MED ORDER — ANASTROZOLE 1 MG PO TABS
1.0000 mg | ORAL_TABLET | Freq: Every day | ORAL | Status: DC
  Administered 2023-08-23 – 2023-08-29 (×7): 1 mg via ORAL
  Filled 2023-08-22 (×7): qty 1

## 2023-08-22 MED ORDER — WARFARIN SODIUM 5 MG PO TABS: 7.5000 mg | ORAL_TABLET | Freq: Once | ORAL | Status: DC

## 2023-08-22 MED ORDER — MIDAZOLAM HCL 2 MG/2ML IJ SOLN
INTRAMUSCULAR | Status: DC | PRN
  Administered 2023-08-22 (×2): 1 mg via INTRAVENOUS

## 2023-08-22 MED FILL — Electrolyte-R (PH 7.4) Solution: INTRAVENOUS | Qty: 3000 | Status: AC

## 2023-08-22 MED FILL — Mannitol IV Soln 20%: INTRAVENOUS | Qty: 500 | Status: AC

## 2023-08-22 MED FILL — Lidocaine HCl Local Preservative Free (PF) Inj 2%: INTRAMUSCULAR | Qty: 14 | Status: AC

## 2023-08-22 MED FILL — Heparin Sodium (Porcine) Inj 1000 Unit/ML: INTRAMUSCULAR | Qty: 40 | Status: AC

## 2023-08-22 MED FILL — Sodium Chloride IV Soln 0.9%: INTRAVENOUS | Qty: 2000 | Status: AC

## 2023-08-22 MED FILL — Sodium Bicarbonate IV Soln 8.4%: INTRAVENOUS | Qty: 50 | Status: AC

## 2023-08-22 SURGICAL SUPPLY — 1 items: PAD DEFIB RADIO PHYSIO CONN (PAD) ×1 IMPLANT

## 2023-08-22 NOTE — Progress Notes (Signed)
 NAME:  Jurni Cesaro, MRN:  [id number], DOB:  12-10-1960, LOS: 20 ADMISSION DATE:  07/31/2023, CONSULTATION DATE:  5/9 REFERRING MD:  Mitzie Anda, CHIEF COMPLAINT:  respiratory failure   History of Present Illness:  Ms. Trafton is a 63 y/o woman with a history of DM, HTN who presented on 5/1 with cellulitis of her LUE and sepsis. At presentation she complained of fevers, generalized weakness, and reduced PO intake. During her evaluation she was diagnosed with GBS bacteremia and mitral valve endocarditis with leaflet perforation. She required dental extractions for dental carries. Plans have been in place for mitral valve repair by TCTS. She was planned for [facility] today; after coming to the cath lab and going through timeout procedure she was moved to the cath lab table. She very quickly got distressed lying back, trying to sit up. She was placed on bipap for respiratory distress. She suddenly went unresponsive during this time without ongoing agitation. No sedation was given. PCCM was consulted in cath lab for emergent management of respiratory failure.    Pertinent  Medical History  MD HTN HLD Breast cancer, s/p left mastectomy  Significant Hospital Events: Including procedures, antibiotic start and stop dates in addition to other pertinent events   5/1 Admitted  5/9 deteriorated due to flash pulmonary edema, intubated, IABP. 5/12 Extubated  5/13 Remains off vent, overnight needing levophed  gtt restarted but weaning off  5/14 plan for mitral valve replacement, still on IABP 1:1  5/15 mitral valve replacement, remains on vent post op. Extubated, IABP removed 5/16 hypertensive, required multiple doses of hydralazine .  Remain on milrinone  5/17 remain on milrinone  at 0.25, tachycardic 5/20 lasix  gtt/acetazolamide , milrinone  down 0.125 5/22 1 unit pRBC, weaned off oxygen 5/23 cardioversion after TEE; cortrak out after  Interim History / Subjective:  TEE cardioversion this morning. She feels well.    Objective    Physical Exam  General: elderly woman lying in bed in NAD HEENT:  [place]/AT, eyes anicteric Neuro: lethargic from residual sedation, arouses to voice and remains awake and alert, answering questions appropriately. No focal deficits.  CV: S1S2, RRR, NSR on tele. Mechanical click.  PULM:  breathing comfortably on RA, CTAB GI: obese, soft, NT Extremities: no significant edema, no cyanosis Skin: warm, dry, no rashes  I/O -1.5L past 24 hrs, +2.3L for the admission  Coox 64% Na+  135 BUN 30 Cr 1.05 WBC 9.1 H/H 9.5/29.4 Platelets 409 INR 1.6**  Resolved Hospital Problem list :  Acute encephalopathy due to hypercapnia Hyperkalemia, resolved> wonder if this was due to succinylcholine  at intubation Hypomagnesemia Thrombocytopenia Acute pulmonary edema due to hypertension, improved Acute respiratory failure with hypoxia and hypercapnia Hypervolemic hyponatremia  Assessment & Plan:  Acute mitral valve Strep agalactiae endocarditis with severe mitral regurgitation, s/p mechanical mitral valve replacement Acute HFrEF with cardiogenic shock on milrinone  Therapeutic AC on warfarin for mechanical MV A-flutter with RVR -post-op care per TCTS -con't aggressive diuresis- lasix . With 1L per day of IVF for antibiotics, she will need significant diuretics until this is off. -off milrinone , monitor coox -con't amiodarone  -cardioverted today successfully -con't dig and spiro -PCN G for 6 weeks since date of surgery for endocarditis-- stop date 6/25. ID would likely to be reconsulted when she gets to CIR. -Warfarin dosing per PharmD, bridging with heparin .  -monitor electrolytes, replete as needed -tele monitoring  Group A streptococcus bacteremia  -PCN G per ID x 6 weeks (stop date  6/25)  Poorly controlled diabetes with hyperglycemia; very high BG after eating  small breakfast -semglee  30 units BID -mealtime coverage 5 units TIDAC, hold if not eating -SSI PRN -goal BG  140-180  Anemia due to critical illness, operative blood loss. S/p 1 unit pRBC on 5/22. -monitor, transfuse for Hb <7 or hemodynamically significant bleeding  Morbid obesity BMI 43 -long term recommend weight loss  Nausea  -antiemetics PRN  Anxiety -clonazepam  PRN  Diarrhea -con't banatrol 4 times daily, may be able to back off without TF -hopefully will improve once off TF, but long-term anitbiotics will make this challenging  At risk for malnutrition -encouraged PO intake  H/o breast cancer - con't daily anastrazole   Deconditioning -PT, OT -will benefit from CIR post-discharge   Best Practice (right click and "Reselect all SmartList Selections" daily)   Diet/type: reg consistency DVT prophylaxis Coumadin & heparin  gtt Pressure ulcer(s): Deferred to nursing notes GI prophylaxis: PPI Lines: Central line- picc Foley:  removal ordered  Code Status:  full code Last date of multidisciplinary goals of care discussion-  patient updated daily during rounds  Joesph Mussel, DO 08/22/23 10:51 AM [facility]  For contact information, see Amion. If no response to pager, please call PCCM consult pager. After hours, 7PM- 7AM, please call Elink.

## 2023-08-22 NOTE — Progress Notes (Signed)
 Physical Therapy Treatment Patient Details Name: Melissa Bray MRN: [id number] DOB: [birth year] Today's Date: 08/22/2023   History of Present Illness 63 y.o. female presents to [facility] 07/31/23 from [place]. Originally diagnosed with sepsis 2/2 LUE cellulitis with transfer to [facility] after murmur found. Pt with severe mitral regurgitation 2/2 endocarditis. 5/5 TEE. 5/8 extraction of teeth. 5/9 quick deterioration 2/2 flash pulmonary edema and cardiogenic shock before LHC was performed. 5/9-5/12 intubated. 5/9-5/15 IABP. 5/13 MVR. 5/23 cardioversion and TEE. PMHx: T2DM, HTN, obesity, GAD, sinus tachycardia   PT Comments  Pt in bed upon arrival and agreeable to PT session. Pt continues to make steady progress towards acute PT goals. Pt was able to perform bed mobility with ModA and cues for technique with sternal precautions. Frequent cues throughout session for adherence to sternal precautions. When standing, pt needed MinA to CGA with hands on knees and use of momentum. Pt increased gait distance to 90 ft with MinA and RW before needing a seated rest break. Pt was slightly unsteady with x3 lateral LOB during gait trials that were recovered with a side-step and MinA. Three total gait trials with seated rest breaks in between. Continue to recommend >3hrs post acute rehab to maximize rehab potential. Acute PT to follow.     If plan is discharge home, recommend the following: A lot of help with walking and/or transfers;A lot of help with bathing/dressing/bathroom;Assistance with cooking/housework;Assist for transportation;Help with stairs or ramp for entrance   Can travel by private vehicle      Yes  Equipment Recommendations  Rolling walker (2 wheels);BSC/3in1    Recommendations for Other Services Rehab consult     Precautions / Restrictions Precautions Precautions: Sternal;Fall Recall of Precautions/Restrictions: Impaired Precaution/Restrictions Comments: repetitive cues for adherance to  precautions Restrictions Other Position/Activity Restrictions: sternal     Mobility  Bed Mobility Overal bed mobility: Needs Assistance Bed Mobility: Rolling, Sidelying to Sit Rolling: Mod assist Sidelying to sit: Mod assist, +2 for safety/equipment    General bed mobility comments: ModA to roll and raise trunk. MinA to shift hips forward with use of bed pad. Cues for technique to adhere to precautions    Transfers Overall transfer level: Needs assistance Equipment used: Rolling walker (2 wheels) Transfers: Sit to/from Stand, Bed to chair/wheelchair/BSC Sit to Stand: Contact guard assist, Min assist   Step pivot transfers: Min assist, Contact guard assist    General transfer comment: MinA to CGA to stand with cues for hands on knees and to rock for momentum. Repetitive cues throughout session as pt would attempt to keep hands on RW    Ambulation/Gait Ambulation/Gait assistance: Min assist, +2 safety/equipment Gait Distance (Feet): 90 Feet (x90, x60, x30) Assistive device: Rolling walker (2 wheels) Gait Pattern/deviations: Step-through pattern, Decreased stride length Gait velocity: decr     General Gait Details: x3 lateral LOB recovered with side-step and MinA. x2 seated rest breaks due to fatigue. Close chair follow     Balance Overall balance assessment: Needs assistance Sitting-balance support: No upper extremity supported, Feet supported Sitting balance-Leahy Scale: Good     Standing balance support: No upper extremity supported Standing balance-Leahy Scale: Fair Standing balance comment: able to stand statically with no AD, RW for gait       Communication Communication Communication: Impaired Factors Affecting Communication: Reduced clarity of speech (soft spoken)  Cognition Arousal: Alert Behavior During Therapy: WFL for tasks assessed/performed   PT - Cognitive impairments: No apparent impairments    Following commands: Intact  Cueing Cueing  Techniques: Verbal cues, Tactile cues  Exercises Other Exercises Other Exercises: x3 STS with MinA/CGA        Pertinent Vitals/Pain Pain Assessment Pain Assessment: No/denies pain     PT Goals (current goals can now be found in the care plan section) Acute Rehab PT Goals PT Goal Formulation: With patient Time For Goal Achievement: 08/31/23 Potential to Achieve Goals: Good Progress towards PT goals: Progressing toward goals    Frequency    Min 3X/week       AM-PAC PT "6 Clicks" Mobility   Outcome Measure  Help needed turning from your back to your side while in a flat bed without using bedrails?: A Lot Help needed moving from lying on your back to sitting on the side of a flat bed without using bedrails?: A Lot Help needed moving to and from a bed to a chair (including a wheelchair)?: A Little Help needed standing up from a chair using your arms (e.g., wheelchair or bedside chair)?: A Little Help needed to walk in hospital room?: A Little Help needed climbing 3-5 steps with a railing? : Total 6 Click Score: 14    End of Session Equipment Utilized During Treatment: Gait belt Activity Tolerance: Patient tolerated treatment well Patient left: in chair;with call bell/phone within reach;with family/visitor present Nurse Communication: Mobility status (RN present during session) PT Visit Diagnosis: Unsteadiness on feet (R26.81);Other abnormalities of gait and mobility (R26.89);Muscle weakness (generalized) (M62.81)     Time: 5784-6962 PT Time Calculation (min) (ACUTE ONLY): 25 min  Charges:    $Gait Training: 8-22 mins $Therapeutic Activity: 8-22 mins PT General Charges $$ ACUTE PT VISIT: 1 Visit                     Orysia Blas, PT, DPT Secure Chat Preferred  Rehab Office [phone]   Melissa Bray 08/22/2023, 12:22 PM

## 2023-08-22 NOTE — Plan of Care (Signed)
 Problem: Education: Goal: Knowledge of General Education information will improve Description: Including pain rating scale, medication(s)/side effects and non-pharmacologic comfort measures Outcome: Progressing   Problem: Health Behavior/Discharge Planning: Goal: Ability to manage health-related needs will improve Outcome: Progressing   Problem: Clinical Measurements: Goal: Ability to maintain clinical measurements within normal limits will improve Outcome: Progressing Goal: Will remain free from infection Outcome: Progressing Goal: Diagnostic test results will improve Outcome: Progressing Goal: Respiratory complications will improve Outcome: Progressing Goal: Cardiovascular complication will be avoided Outcome: Progressing   Problem: Activity: Goal: Risk for activity intolerance will decrease Outcome: Progressing   Problem: Nutrition: Goal: Adequate nutrition will be maintained Outcome: Progressing   Problem: Coping: Goal: Level of anxiety will decrease Outcome: Progressing   Problem: Elimination: Goal: Will not experience complications related to bowel motility Outcome: Progressing Goal: Will not experience complications related to urinary retention Outcome: Progressing   Problem: Pain Managment: Goal: General experience of comfort will improve and/or be controlled Outcome: Progressing   Problem: Safety: Goal: Ability to remain free from injury will improve Outcome: Progressing   Problem: Skin Integrity: Goal: Risk for impaired skin integrity will decrease Outcome: Progressing   Problem: Education: Goal: Ability to describe self-care measures that may prevent or decrease complications (Diabetes Survival Skills Education) will improve Outcome: Progressing Goal: Individualized Educational Video(s) Outcome: Progressing   Problem: Coping: Goal: Ability to adjust to condition or change in health will improve Outcome: Progressing   Problem: Fluid  Volume: Goal: Ability to maintain a balanced intake and output will improve Outcome: Progressing   Problem: Health Behavior/Discharge Planning: Goal: Ability to identify and utilize available resources and services will improve Outcome: Progressing Goal: Ability to manage health-related needs will improve Outcome: Progressing   Problem: Metabolic: Goal: Ability to maintain appropriate glucose levels will improve Outcome: Progressing   Problem: Nutritional: Goal: Maintenance of adequate nutrition will improve Outcome: Progressing Goal: Progress toward achieving an optimal weight will improve Outcome: Progressing   Problem: Skin Integrity: Goal: Risk for impaired skin integrity will decrease Outcome: Progressing   Problem: Tissue Perfusion: Goal: Adequacy of tissue perfusion will improve Outcome: Progressing   Problem: Fluid Volume: Goal: Hemodynamic stability will improve Outcome: Progressing   Problem: Clinical Measurements: Goal: Signs and symptoms of infection will decrease Outcome: Progressing   Problem: Clinical Measurements: Goal: Ability to avoid or minimize complications of infection will improve Outcome: Progressing   Problem: Skin Integrity: Goal: Skin integrity will improve Outcome: Progressing   Problem: Education: Goal: Understanding of CV disease, CV risk reduction, and recovery process will improve Outcome: Progressing Goal: Individualized Educational Video(s) Outcome: Progressing   Problem: Activity: Goal: Ability to return to baseline activity level will improve Outcome: Progressing   Problem: Cardiovascular: Goal: Ability to achieve and maintain adequate cardiovascular perfusion will improve Outcome: Progressing Goal: Vascular access site(s) Level 0-1 will be maintained Outcome: Progressing   Problem: Health Behavior/Discharge Planning: Goal: Ability to safely manage health-related needs after discharge will improve Outcome: Progressing    Problem: Activity: Goal: Ability to tolerate increased activity will improve Outcome: Progressing   Problem: Respiratory: Goal: Ability to maintain a clear airway and adequate ventilation will improve Outcome: Progressing   Problem: Role Relationship: Goal: Method of communication will improve Outcome: Progressing   Problem: Cardiac: Goal: Ability to achieve and maintain adequate cardiopulmonary perfusion will improve Outcome: Progressing Goal: Vascular access site(s) Level 0-1 will be maintained Outcome: Progressing   Problem: Fluid Volume: Goal: Ability to achieve a balanced intake and output will improve Outcome: Progressing  Problem: Physical Regulation: Goal: Complications related to the disease process, condition or treatment will be avoided or minimized Outcome: Progressing

## 2023-08-22 NOTE — Anesthesia Preprocedure Evaluation (Addendum)
 Anesthesia Evaluation  Patient identified by MRN, date of birth, ID band Patient awake    Reviewed: Allergy & Precautions, NPO status , Patient's Chart, lab work & pertinent test results  Airway Mallampati: II  TM Distance: >3 FB Neck ROM: Full    Dental no notable dental hx.    Pulmonary neg pulmonary ROS   Pulmonary exam normal        Cardiovascular hypertension, Pt. on medications and Pt. on home beta blockers  Rhythm:Regular Rate:Normal     Neuro/Psych   Anxiety Depression    negative neurological ROS     GI/Hepatic Neg liver ROS,GERD  ,,  Endo/Other  diabetes, Type 2, Oral Hypoglycemic Agents, Insulin  Dependent    Renal/GU   negative genitourinary   Musculoskeletal   Abdominal Normal abdominal exam  (+)   Peds  Hematology Lab Results      Component                Value               Date                      WBC                      9.1                 08/22/2023                HGB                      9.5 (L)             08/22/2023                HCT                      29.4 (L)            08/22/2023                MCV                      89.1                08/22/2023                PLT                      409 (H)             08/22/2023              Anesthesia Other Findings   Reproductive/Obstetrics                             Anesthesia Physical Anesthesia Plan  ASA: 3  Anesthesia Plan: MAC   Post-op Pain Management:    Induction:   PONV Risk Score and Plan: 2 and Propofol  infusion and Treatment may vary due to age or medical condition  Airway Management Planned: Simple Face Mask and Nasal Cannula  Additional Equipment: None  Intra-op Plan:   Post-operative Plan:   Informed Consent: I have reviewed the patients History and Physical, chart, labs and discussed the procedure including the risks, benefits and alternatives for the proposed anesthesia with the patient  or authorized representative who has  indicated his/her understanding and acceptance.     Dental advisory given  Plan Discussed with: CRNA  Anesthesia Plan Comments:        Anesthesia Quick Evaluation

## 2023-08-22 NOTE — Transfer of Care (Signed)
 Immediate Anesthesia Transfer of Care Note  Patient: Melissa Bray  Procedure(s) Performed: TRANSESOPHAGEAL ECHOCARDIOGRAM CARDIOVERSION  Patient Location: PACU and Cath Lab  Anesthesia Type:MAC  Level of Consciousness: awake, alert , and oriented  Airway & Oxygen Therapy: Patient Spontanous Breathing  Post-op Assessment: Report given to RN and Post -op Vital signs reviewed and stable  Post vital signs: Reviewed and stable  Last Vitals:  Vitals Value Taken Time  BP 113/72 08/22/23 0936  Temp    Pulse 86 08/22/23 0937  Resp 24 08/22/23 0937  SpO2 100 % 08/22/23 0937  Vitals shown include unfiled device data.  Last Pain:  Vitals:   08/22/23 0800  TempSrc: Temporal  PainSc: 0-No pain      Patients Stated Pain Goal: 0 (08/19/23 2000)  Complications: There were no known notable events for this encounter.

## 2023-08-22 NOTE — Anesthesia Postprocedure Evaluation (Signed)
 Anesthesia Post Note  Patient: Jacora Mcgovern  Procedure(s) Performed: TRANSESOPHAGEAL ECHOCARDIOGRAM CARDIOVERSION     Patient location during evaluation: PACU Anesthesia Type: MAC Level of consciousness: awake and alert Pain management: pain level controlled Vital Signs Assessment: post-procedure vital signs reviewed and stable Respiratory status: spontaneous breathing, nonlabored ventilation, respiratory function stable and patient connected to nasal cannula oxygen Cardiovascular status: stable and blood pressure returned to baseline Postop Assessment: no apparent nausea or vomiting Anesthetic complications: no   There were no known notable events for this encounter.  Last Vitals:  Vitals:   08/22/23 0945 08/22/23 0950  BP: 117/61 119/63  Pulse: 85 86  Resp: (!) 26 (!) 22  Temp:    SpO2: 95% 96%    Last Pain:  Vitals:   08/22/23 0950  TempSrc:   PainSc: 0-No pain                 Valente Gaskin Aja Whitehair

## 2023-08-22 NOTE — Interval H&P Note (Signed)
 History and Physical Interval Note:  08/22/2023 9:03 AM  Melissa Bray  has presented today for surgery, with the diagnosis of afib.  The various methods of treatment have been discussed with the patient and family. After consideration of risks, benefits and other options for treatment, the patient has consented to  Procedure(s): TRANSESOPHAGEAL ECHOCARDIOGRAM (N/A) CARDIOVERSION (N/A) as a surgical intervention.  The patient's history has been reviewed, patient examined, no change in status, stable for surgery.  I have reviewed the patient's chart and labs.  Questions were answered to the patient's satisfaction.     Lauralee Poll

## 2023-08-22 NOTE — CV Procedure (Signed)
   TRANSESOPHAGEAL ECHOCARDIOGRAM GUIDED DIRECT CURRENT CARDIOVERSION  NAME:  Melissa Bray    MRN: [id number] DOB:  24-Oct-1960    ADMIT DATE: 07/31/2023  INDICATIONS: Symptomatic atrial flutter  PROCEDURE:   Informed consent was obtained prior to the procedure. The risks, benefits and alternatives for the procedure were discussed and the patient comprehended these risks.  Risks include, but are not limited to, cough, sore throat, vomiting, nausea, somnolence, esophageal and stomach trauma or perforation, bleeding, low blood pressure, aspiration, pneumonia, infection, trauma to the teeth and death.    After a procedural timeout, the patient was administered propofol  per anesthesia.  The patient's heart rate, blood pressure, and oxygen saturation were monitored continuously during the procedure. The period of conscious sedation was 20 minutes, of which I was present face-to-face 100% of this time.  The transesophageal probe was inserted in the esophagus and stomach without difficulty and multiple views were obtained.  The patient was kept under observation until the patient left the procedure room.  The patient left the procedure room in stable condition.    COMPLICATIONS:    Complications: No complications Patient tolerated procedure well.  KEY FINDINGS:  Mechanical mitral valve with normal gradient Moderately reduced LV function in the setting of atrial flutter Appendage with small, <1cm pouch. No evidence of clot Pleural effusion Full Report to follow.   CARDIOVERSION:     Indications:  Symptomatic Atrial Flutter  Procedure Details:  Once the TEE was complete, the patient had the defibrillator pads placed in the anterior and posterior position. Once an appropriate level of sedation was confirmed, the patient was cardioverted x successfully with 150J of biphasic synchronized energy.  The patient converted to NSR.  There were no apparent complications.  The patient had normal neuro  status and respiratory status post procedure with vitals stable as recorded elsewhere.  Adequate airway was maintained throughout and vital signs monitored per protocol.  Arta Lark Advanced Heart Failure 9:26 AM

## 2023-08-22 NOTE — Progress Notes (Signed)
  Echocardiogram Echocardiogram Transesophageal has been performed.  Melissa Bray 08/22/2023, 9:46 AM

## 2023-08-22 NOTE — Progress Notes (Signed)
 Nutrition Follow-up  DOCUMENTATION CODES:   Obesity unspecified  INTERVENTION:   CALORIE COUNT  Regular diet; encouraged small frequent meals and ok for family to bring in outside food/snacks if pt desires  Ensure Enlive po BID, each supplement provides 350 kcal and 20 grams of protein.  Add Magic cup BID with meals, each supplement provides 290 kcal and 9 grams of protein  Given Cortrak inadvertently removed, discussed with Dr. Fulton Job and plan to hold replacement and continue calorie count. If po intake not adequate over the weekend, recommend considering re-insertion of Cortrak   NUTRITION DIAGNOSIS:   Inadequate oral intake related to acute illness as evidenced by NPO status.  Being addressed  GOAL:   Patient will meet greater than or equal to 90% of their needs  Progressing  MONITOR:   Diet advancement, TF tolerance, Labs, Weight trends  REASON FOR ASSESSMENT:   Consult Enteral/tube feeding initiation and management  ASSESSMENT:   63 yo female admitted with sepsis with cellulitis of LUE, diagnosed with GBS bacteremia and MV endocarditis with consult to TCTS, developed flash pulmonary edema and shock requiring intubation and IABP. PMH includes HTN, HLD, breast cancer s/p left mastectomy, DM, GERD  TEE today, cortrak out during procedure. TF off since midnight NPO this morning  Pt up and walking around the unit with assistance this AM  RD spoke with pt and pt's nephew yesterday. Discussed importance of increasing oral intake. Pt agreeable to Ensure; also encouraged small frequent meals. Nephew went to vending machine and got pt some trail mix which she was snacking on during visit.   Limited documentation from Calorie Count yesterday. No dinner recorded. Pt ate 50% of [place] and gravy, 75% of broccoli, 25% mashed potatoes and gravy, bites of fruit. Unsure if pt drank any Ensure yesterday  +loose stools; receiving banatrol QID. Also noted imodium x 1  dose   Labs: CBGs 135-291 (goal 140-180)  Meds:  SS novolog ,  Novlog 5 unist with meals Semglee  BID   Diet Order:   Diet Order             Diet Heart Room service appropriate? Yes; Fluid consistency: Thin  Diet effective now                   EDUCATION NEEDS:   Not appropriate for education at this time  Skin:  Skin Assessment: Skin Integrity Issues: Skin Integrity Issues:: Wound VAC Wound Vac: chest incision  Last BM:  5/19 +diarrhea  Height:   Ht Readings from Last 1 Encounters:  08/10/23 5\' 5"  (1.651 m)    Weight:   Wt Readings from Last 1 Encounters:  08/22/23 118.8 kg    Ideal Body Weight:     BMI:  Body mass index is 43.58 kg/m.  Estimated Nutritional Needs:   Kcal:  1700-1900 kcals  Protein:  120-140 gm  Fluid:  1.7 L   Norvel Beer MS, RDN, LDN, CNSC Registered Dietitian 3 Clinical Nutrition RD Inpatient Contact Info in Amion

## 2023-08-22 NOTE — Progress Notes (Signed)
 PHARMACY - ANTICOAGULATION CONSULT NOTE  Pharmacy Consult for warfarin + heparin  Indication: onX mMVR  No Known Allergies  Patient Measurements: Height: 5\' 5"  (165.1 cm) Weight: 118.8 kg (261 lb 14.5 oz) IBW/kg (Calculated) : 57 HEPARIN  DW (KG): 84.3  Vital Signs: Temp: 98.9 F (37.2 C) (05/22 2330) Temp Source: Axillary (05/22 2330) BP: 119/106 (05/23 0400) Pulse Rate: 136 (05/23 0400)  Labs: Recent Labs    08/20/23 0434 08/20/23 1716 08/21/23 0510 08/21/23 0710 08/22/23 0523  HGB  --   --  7.5*  --  9.5*  HCT  --   --  23.6*  --  29.4*  PLT  --   --  329  --  409*  LABPROT 18.4*  --  20.8*  --  19.4*  INR 1.5*  --  1.8*  --  1.6*  HEPARINUNFRC  --    < > >1.10* 0.36 0.46  CREATININE 1.04*  --  1.05*  --  1.05*   < > = values in this interval not displayed.    Estimated Creatinine Clearance: 69.8 mL/min (A) (by C-G formula based on SCr of 1.05 mg/dL (H)).   Medical History: Past Medical History:  Diagnosis Date   Anxiety    Breast cancer (HCC)    Diabetes mellitus without complication (HCC)    Hyperlipidemia    Hypertension    Type 2 diabetes mellitus without retinopathy (HCC) 06/17/2017    Medications:  Scheduled:   sodium chloride    Intravenous Once   anastrozole   1 mg Per Tube Daily   aspirin   81 mg Per Tube Daily   Chlorhexidine  Gluconate Cloth  6 each Topical Daily   digoxin  0.125 mg Per Tube Daily   feeding supplement  237 mL Oral BID BM   feeding supplement (PROSource TF20)  60 mL Per Tube Daily   fiber supplement (BANATROL TF)  60 mL Per Tube QID   insulin  aspart  0-24 Units Subcutaneous Q4H   insulin  aspart  3 Units Subcutaneous Q4H   insulin  glargine-yfgn  30 Units Subcutaneous BID   pantoprazole  (PROTONIX ) IV  40 mg Intravenous Daily   sodium chloride  flush  10-40 mL Intracatheter Q12H   sodium chloride  flush  3-10 mL Intravenous Q12H   spironolactone   25 mg Per Tube Daily   Warfarin - Pharmacist Dosing Inpatient   Does not apply q1600     Assessment: 30 yof presenting with acute mitral valve endocarditis with perforated mitral leaflet and severe MR. Underwent dental extraction 5/8. Presented for pre-op RHC and developed flash pulmonary edema requiring intubation - IABP was placed in lab and IV heparin  started. No AC PTA.  Pt now s/p cardiac surgery with onX mMVR placed. Pharmacy consulted to start warfarin 5/16 after IABP removal 5/15. INR dropped to 1.5 with TEE/DCCV planned later this week so IV heparin  started (ok with surgery since 7 days postop).   Heparin  level 0.46 therapeutic, CBC ok, INR low at 1.6. Diet improved.  Goal of Therapy:  INR 2.5-3.5 Heparin  level 0.3-0.5 Monitor platelets by anticoagulation protocol: Yes   Plan:  Warfarin 7.5mg  x1 tonight Heparin  1400 units/h Heparin  level, INR, CBC daily   Levin Reamer, PharmD, BCPS, [facility] Clinical Pharmacist [phone] Please check AMION for all [facility] Pharmacy numbers 08/22/2023

## 2023-08-22 NOTE — Progress Notes (Signed)
 Inpatient Rehab Admissions Coordinator:     CIR following. Pt. Not yet read to puruse for admit. Had cardioversion this AM and remains on amio gtt. I will follow up and potentially send case to insurance on Monday.   Wandalee Gust, MS, CCC-SLP Rehab Admissions Coordinator  [phone] (celll) [phone] (office)

## 2023-08-22 NOTE — Progress Notes (Signed)
 PT Cancellation Note  Patient Details Name: Melissa Bray MRN: [id number] DOB: [birth year]   Cancelled Treatment:    Reason Eval/Treat Not Completed: Patient at procedure or test/unavailable (Pt off unit in cath lab. Acute PT to re-attempt as schedule allows.)  Sharief Wainwright W, PT, DPT Secure Chat Preferred  Rehab Office [phone]   Alissa April Adela Ades 08/22/2023, 9:34 AM

## 2023-08-22 NOTE — Plan of Care (Signed)

## 2023-08-22 NOTE — Progress Notes (Signed)
 Patient ID: Melissa Bray, female   DOB: May 28, 1960, 63 y.o.   MRN: [id number]     Advanced Heart Failure Rounding Note  Cardiologist: Eilleen Grates, MD   Chief Complaint: CHF  Subjective:    63 y/o obese woman admitted with cardiogenic shock in setting of severe MR due to endocarditis and perforation of anterior MV leaflet. Underwent mMVR on 08/14/23. Post-op course c/b RV Failure  POD#9 s/p MVR  5.3L in UOP yesterday w/ lasix  gtt. Scr stable 1.05, K 4.1  Off Milrinone . Co-ox stable, 63%.  Underwent DCCV this morning. Converted to NSR.    Objective:    Weight Range: 118.8 kg Body mass index is 43.58 kg/m.   Vital Signs:   Temp:  [97.8 F (36.6 C)-99 F (37.2 C)] 97.8 F (36.6 C) (05/23 0939) Pulse Rate:  [85-141] 86 (05/23 0950) Resp:  [13-32] 22 (05/23 0950) BP: (90-132)/(52-106) 119/63 (05/23 0950) SpO2:  [89 %-100 %] 96 % (05/23 0950) Weight:  [118.8 kg] 118.8 kg (05/23 0632) Last BM Date : 08/20/23  Weight change: Filed Weights   08/20/23 0500 08/21/23 0500 08/22/23 4098  Weight: 118.3 kg 118.1 kg 118.8 kg   Intake/Output:  Intake/Output Summary (Last 24 hours) at 08/22/2023 1033 Last data filed at 08/22/2023 0934 Gross per 24 hour  Intake 3299.61 ml  Output 5079 ml  Net -1779.39 ml    Physical Exam   General:  Well appearing, obese No respiratory difficulty HEENT: normal Neck: supple. JVD 8 cm.  Cor: Regular rate & rhythm. + mechanical valve sounds  Lungs: clear Abdomen: soft, nontender, nondistended.  Extremities: no cyanosis, clubbing, rash, trace b/l LE edema  Telemetry   NSR 90s personally reviewed.  Labs    CBC Recent Labs    08/21/23 0510 08/22/23 0523  WBC 7.5 9.1  HGB 7.5* 9.5*  HCT 23.6* 29.4*  MCV 89.1 89.1  PLT 329 409*   Basic Metabolic Panel Recent Labs    11/91/47 0434 08/21/23 0510 08/22/23 0523  NA 133* 132* 135  K 3.5 4.5 4.1  CL 96* 95* 99  CO2 29 27 28   GLUCOSE 89 152* 144*  BUN 24* 29* 30*  CREATININE  1.04* 1.05* 1.05*  CALCIUM  8.3* 8.0* 8.4*  MG 2.0 1.8  --    Imaging   EP STUDY Result Date: 08/22/2023 See surgical note for result.     Medications:    Scheduled Medications:  sodium chloride    Intravenous Once   [START ON 08/23/2023] anastrozole   1 mg Oral Daily   [START ON 08/23/2023] aspirin   81 mg Oral Daily   Chlorhexidine  Gluconate Cloth  6 each Topical Daily   digoxin  0.125 mg Per Tube Daily   feeding supplement  237 mL Oral BID BM   feeding supplement (PROSource TF20)  60 mL Per Tube Daily   fiber supplement (BANATROL TF)  60 mL Per Tube QID   insulin  aspart  0-24 Units Subcutaneous Q4H   insulin  aspart  3 Units Subcutaneous Q4H   insulin  glargine-yfgn  30 Units Subcutaneous BID   pantoprazole  (PROTONIX ) IV  40 mg Intravenous Daily   sodium chloride  flush  10-40 mL Intracatheter Q12H   [START ON 08/23/2023] spironolactone   25 mg Oral Daily   warfarin  7.5 mg Oral ONCE-1600   Warfarin - Pharmacist Dosing Inpatient   Does not apply q1600   Infusions:  amiodarone  30 mg/hr (08/22/23 0800)   feeding supplement (VITAL AF 1.2 CAL) Stopped (08/22/23 0000)   furosemide  (LASIX )  200 mg in dextrose  5 % 100 mL (2 mg/mL) infusion 20 mg/hr (08/22/23 0800)   heparin  1,400 Units/hr (08/22/23 0800)   penicillin  G potassium 12 Million Units in sodium chloride  0.9 % 500 mL CONTINUOUS infusion 12 Million Units (08/22/23 1027)   PRN Medications: clonazePAM , dextrose , melatonin, morphine  injection, ondansetron  (ZOFRAN ) IV, mouth rinse, oxyCODONE , prochlorperazine , senna-docusate, sodium chloride  flush, traMADol   Assessment/Plan   1. Cardiogenic shock: Valvular shock from severe MR.  TEE on 5/5 showed LV EF 65%, normal RV function, severe MR with perforated leaflet and mobile vegetation.  Patient developed flash pulmonary edema 5/9 in setting of marked hypertension -> IABP placed. - s/p mMVR 08/14/23 - POCUS ECHO 5/15 EF 60% RV mod reduced - Good diuresis on lasix  gtt, volume status  significantly improved - Stop Lasix  gtt. Transition to PO torsemide 40 mg bid  - Milrinone  stopped 5/22. Co-ox ok 63%. Follow for 1 more day  - Continue spironolactone  25 mg daily - Continue digoxin 0.125 mg daily   2. Mitral regurgitation: Severe on TEE with perforated anterior leaflet and mitral valve vegetation.  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  - s/p MVR with Dr. Luna Salinas 08/14/23 - post-op antibiotics per ID. On PenG, will require 6 weeks IV abx (start date 05/14). - INR 1.6. Warfarin / heparin  bridging per CT surgery   3. Acute hypercarbic/hypoxemic respiratory failure in setting of #1&2 - Resolved - Encouraged IS   4. Atrial flutter with RVR - S/p DCCV 5/23. Maintaining NSR  - Continue amio gtt through today, transition to PO tomorrow if maintaining NSR - Warfarin/ heparin  gtt as above  5. Morbid obesity - Body mass index is 43.58 kg/m.  6. Hypervolemic Hyponatremia - resolved w/ diuresis, 135 today   7. Anemia - Hgb improved post transfusion, 9.5 today  - labs c/w IDA but given acute infection will hold off on IV Fe    Length of Stay: [address], PA-C  08/22/2023, 10:33 AM  Advanced Heart Failure Team Pager [phone] (M-F; 7a - 5p)  Please contact [facility] for night-coverage after hours (5p -7a ) and weekends on [email or web address]

## 2023-08-22 NOTE — Progress Notes (Addendum)
 [address]       Melissa Bray [address]             [phone]      * Day of Surgery * Procedure(s) (LRB): TRANSESOPHAGEAL ECHOCARDIOGRAM (N/A) CARDIOVERSION (N/A) Subjective: Patient drowsy, resting in bed after TEE/DCCV  Objective: Vital signs in last 24 hours: Temp:  [97.8 F (36.6 C)-99 F (37.2 C)] 97.8 F (36.6 C) (05/23 0800) Pulse Rate:  [108-141] 139 (05/23 0800) Cardiac Rhythm: Atrial flutter (05/23 0800) Resp:  [13-32] 24 (05/23 0800) BP: (90-132)/(52-106) 94/52 (05/23 0800) SpO2:  [89 %-100 %] 100 % (05/23 0700) Weight:  [118.8 kg] 118.8 kg (05/23 7253)  Hemodynamic parameters for last 24 hours: CVP:  [7 mmHg-18 mmHg] 14 mmHg  Intake/Output from previous day: 05/22 0701 - 05/23 0700 In: 3844 [I.V.:1041.5; Blood:364; NG/GT:1405; IV Piggyback:1033.5] Out: 5334 [Urine:5334] Intake/Output this shift: Total I/O In: -  Out: 600 [Urine:600]  General appearance: no distress and drowsy Neurologic: intact Heart: regular rate and rhythm, S1, S2 normal, no murmur, click, rub or gallop Lungs: diminished bibasilar breath sounds Abdomen: soft, non-tender; bowel sounds normal; no masses,  no organomegaly Extremities: edema 1+ BLE Wound: Clean and dry without sign of infection  Lab Results: Recent Labs    08/21/23 0510 08/22/23 0523  WBC 7.5 9.1  HGB 7.5* 9.5*  HCT 23.6* 29.4*  PLT 329 409*   BMET:  Recent Labs    08/21/23 0510 08/22/23 0523  NA 132* 135  K 4.5 4.1  CL 95* 99  CO2 27 28  GLUCOSE 152* 144*  BUN 29* 30*  CREATININE 1.05* 1.05*  CALCIUM  8.0* 8.4*    PT/INR:  Recent Labs    08/22/23 0523  LABPROT 19.4*  INR 1.6*   ABG    Component Value Date/Time   PHART 7.465 (H) 08/14/2023 1700   HCO3 25.0 08/14/2023 1700   TCO2 26 08/14/2023 1700   ACIDBASEDEF 1.0 08/08/2023 1634   O2SAT 63.5 08/22/2023 0530   CBG (last 3)  Recent Labs    08/22/23 0006 08/22/23 0012 08/22/23 0337  GLUCAP 291* 284* 135*     Assessment/Plan: S/P Procedure(s) (LRB): TRANSESOPHAGEAL ECHOCARDIOGRAM (N/A) CARDIOVERSION (N/A)  Neuro: Drowsy, resting in bed this AM after DC-CV   CV: Postop PAF with RVR, on Amiodarone  gtt. Successful TEE/DC-CV today. Now NSR, HR 80s. SBP 100-110s. On Spironolactone  25mg  daily and Digoxin 0.125mg  daily. AHF team is following, appreciate their assistance. Coox 63.5 this AM, milrinone  has been d/c'd per AHF team.    Pulm: Saturating 98% on RA. Last CXR with bibasilar atelectasis. Encourage IS and ambulation   GI: +BM, Cortrak has been removed. Tolerating diet. No nausea, vomiting. Continue Kcal count.   Endo: Better controlled on Semglee  35U BID, Novolog  3U Q4H and SSI PRN but still elevated up to 291 at times. Hopefully will improve now that she is off tube feeds.   Renal: Cr 1.05, stable. On Lasix  drip per AHF. UO 5334cc/24hrs. +1lb from yesterday. K 4.1, at goal. Hyponatremia resolved.   ID: Endocarditis, on Penicillin  G. ID recommending IB PCN G for 6 weeks starting from 05/14, end date of 06/25. They have signed off but will arrange outpatient follow up at discharge. Tmax 99.1, no leukocytosis.    Expected postop ABLA: H/H 9.5/29.4, after 1U PRBCs yesterday. Not clinically significant at this time.    DVT Prophylaxis: Coumadin    Deconditioning: Continue work with PT/OT. Plan for CIR at discharge.  Anticoagulation: On Coumadin , INR 1.6. Received Coumadin  6mg  yesterday. Coumadin  dosing per pharmacy. Goal 2.5-3.5. On IV heparin  until INR is closer to goal.    Dispo: Continue ICU care, hopefully can transfer to 4E soon.    LOS: 20 days    Randa Burton, PA-C 08/22/2023   Chart reviewed, patient examined, agree with above.  She looks good. Up in chair and walked earlier. She is awake and alert and conversant. DCCV this am and now sinus 90 on amio. -1490 cc yesterday. Lasix  drip converted to Torsemide 40 bid. I think she could probably get to 4E tomorrow if no  changes.

## 2023-08-23 ENCOUNTER — Inpatient Hospital Stay (HOSPITAL_COMMUNITY)

## 2023-08-23 DIAGNOSIS — I059 Rheumatic mitral valve disease, unspecified: Secondary | ICD-10-CM | POA: Diagnosis not present

## 2023-08-23 DIAGNOSIS — I5021 Acute systolic (congestive) heart failure: Secondary | ICD-10-CM

## 2023-08-23 LAB — CBC
HCT: 27.2 % — ABNORMAL LOW (ref 36.0–46.0)
Hemoglobin: 8.9 g/dL — ABNORMAL LOW (ref 12.0–15.0)
MCH: 29.2 pg (ref 26.0–34.0)
MCHC: 32.7 g/dL (ref 30.0–36.0)
MCV: 89.2 fL (ref 80.0–100.0)
Platelets: 414 10*3/uL — ABNORMAL HIGH (ref 150–400)
RBC: 3.05 MIL/uL — ABNORMAL LOW (ref 3.87–5.11)
RDW: 14.8 % (ref 11.5–15.5)
WBC: 7.1 10*3/uL (ref 4.0–10.5)
nRBC: 0 % (ref 0.0–0.2)

## 2023-08-23 LAB — PROTIME-INR
INR: 2.3 — ABNORMAL HIGH (ref 0.8–1.2)
INR: 2.1 — ABNORMAL HIGH (ref 0.8–1.2)
Prothrombin Time: 25.2 s — ABNORMAL HIGH (ref 11.4–15.2)
Prothrombin Time: 23.4 s — ABNORMAL HIGH (ref 11.4–15.2)

## 2023-08-23 LAB — BASIC METABOLIC PANEL WITH GFR
Anion gap: 10 (ref 5–15)
BUN: 30 mg/dL — ABNORMAL HIGH (ref 8–23)
CO2: 25 mmol/L (ref 22–32)
Calcium: 8.4 mg/dL — ABNORMAL LOW (ref 8.9–10.3)
Chloride: 101 mmol/L (ref 98–111)
Creatinine, Ser: 1.22 mg/dL — ABNORMAL HIGH (ref 0.44–1.00)
GFR, Estimated: 50 mL/min — ABNORMAL LOW (ref >60)
Glucose, Bld: 128 mg/dL — ABNORMAL HIGH (ref 70–99)
Potassium: 4.6 mmol/L (ref 3.5–5.1)
Sodium: 136 mmol/L (ref 135–145)

## 2023-08-23 LAB — ECHOCARDIOGRAM COMPLETE
AR max vel: 1.59 cm2
AV Peak grad: 8.5 mmHg
Ao pk vel: 1.46 m/s
Area-P 1/2: 3.65 cm2
Height: 65 in
MV VTI: 1.24 cm2
S' Lateral: 2.7 cm
Weight: 4043.2 [oz_av]

## 2023-08-23 LAB — GLUCOSE, CAPILLARY
Glucose-Capillary: 113 mg/dL — ABNORMAL HIGH (ref 70–99)
Glucose-Capillary: 116 mg/dL — ABNORMAL HIGH (ref 70–99)
Glucose-Capillary: 117 mg/dL — ABNORMAL HIGH (ref 70–99)
Glucose-Capillary: 125 mg/dL — ABNORMAL HIGH (ref 70–99)
Glucose-Capillary: 168 mg/dL — ABNORMAL HIGH (ref 70–99)
Glucose-Capillary: 223 mg/dL — ABNORMAL HIGH (ref 70–99)
Glucose-Capillary: 49 mg/dL — ABNORMAL LOW (ref 70–99)
Glucose-Capillary: 70 mg/dL (ref 70–99)

## 2023-08-23 LAB — COOXEMETRY PANEL
Carboxyhemoglobin: 1.7 % — ABNORMAL HIGH (ref 0.5–1.5)
Methemoglobin: <0.7 % (ref 0.0–1.5)
O2 Saturation: 55.2 %
Total hemoglobin: 9.9 g/dL — ABNORMAL LOW (ref 12.0–16.0)

## 2023-08-23 LAB — HEPARIN LEVEL (UNFRACTIONATED)
Heparin Unfractionated: >1.1 [IU]/mL — ABNORMAL HIGH (ref 0.30–0.70)
Heparin Unfractionated: 0.36 [IU]/mL (ref 0.30–0.70)

## 2023-08-23 MED ORDER — AMIODARONE HCL 200 MG PO TABS
400.0000 mg | ORAL_TABLET | Freq: Two times a day (BID) | ORAL | Status: DC
  Administered 2023-08-23 – 2023-08-26 (×7): 400 mg via ORAL
  Filled 2023-08-23 (×7): qty 2

## 2023-08-23 MED ORDER — SODIUM CHLORIDE 0.9% FLUSH: 3.0000 mL | INTRAVENOUS | Status: DC | PRN

## 2023-08-23 MED ORDER — ~~LOC~~ CARDIAC SURGERY, PATIENT & FAMILY EDUCATION: Freq: Once | Status: DC

## 2023-08-23 MED ORDER — SODIUM CHLORIDE 0.9 % IV SOLN: 250.0000 mL | INTRAVENOUS | Status: AC | PRN

## 2023-08-23 MED ORDER — FUROSEMIDE 10 MG/ML IJ SOLN
40.0000 mg | Freq: Two times a day (BID) | INTRAMUSCULAR | Status: AC
  Administered 2023-08-23 – 2023-08-24 (×2): 40 mg via INTRAVENOUS
  Filled 2023-08-23 (×2): qty 4

## 2023-08-23 MED ORDER — SODIUM CHLORIDE 0.9% FLUSH
3.0000 mL | Freq: Two times a day (BID) | INTRAVENOUS | Status: DC
  Administered 2023-08-23 – 2023-08-28 (×9): 3 mL via INTRAVENOUS

## 2023-08-23 MED ORDER — FUROSEMIDE 10 MG/ML IJ SOLN
40.0000 mg | Freq: Two times a day (BID) | INTRAMUSCULAR | Status: DC
Start: 2023-08-23 — End: 2023-08-23

## 2023-08-23 MED ORDER — WARFARIN SODIUM 5 MG PO TABS
10.0000 mg | ORAL_TABLET | Freq: Once | ORAL | Status: AC
  Administered 2023-08-23: 10 mg via ORAL
  Filled 2023-08-23: qty 2

## 2023-08-23 NOTE — Progress Notes (Signed)
 08/23/2023 Discussed with TCTS, heading to floor; will be available PRN  Ardelle Kos MD PCCM

## 2023-08-23 NOTE — Progress Notes (Addendum)
 Patient ID: [name], female   DOB: Jun 22, 1960, 63 y.o.   MRN: [id number]                                                                                               Advanced Heart Failure Rounding Note   Chief Complaint: CHF  Subjective:    63 y/o obese woman admitted with cardiogenic shock in setting of severe MR due to endocarditis and perforation of anterior MV leaflet. Underwent On-X mechanical MVR on 08/14/23. Post-op course c/b RV Failure  POD #10 s/p MVR  Creatinine mildly higher at 1.22 today, CVP 10.  Co-ox 55%.  I/Os even.   DCCV to NSR yesterday,  she remains in NSR on amiodarone  gtt 30 mg/hr.     Objective:    Weight Range: 114.6 kg Body mass index is 42.05 kg/m.   Vital Signs:   Temp:  [97.8 F (36.6 C)-98.5 F (36.9 C)] 98.5 F (36.9 C) (05/23 1547) Pulse Rate:  [81-96] 81 (05/24 0800) Resp:  [13-30] 16 (05/24 0800) BP: (113-125)/(61-72) 114/65 (05/23 1900) SpO2:  [16 %-100 %] 95 % (05/23 1600) Weight:  [114.6 kg] 114.6 kg (05/24 0500) Last BM Date : 08/20/23  Weight change: Filed Weights   08/21/23 0500 08/22/23 0632 08/23/23 0500  Weight: 118.1 kg 118.8 kg 114.6 kg   Intake/Output:  Intake/Output Summary (Last 24 hours) at 08/23/2023 0924 Last data filed at 08/22/2023 2100 Gross per 24 hour  Intake 1580.61 ml  Output 1475 ml  Net 105.61 ml    Physical Exam   General: NAD Neck:JVP 9-10, no thyromegaly or thyroid  nodule.  Lungs: Clear to auscultation bilaterally with normal respiratory effort. CV: Nondisplaced PMI.  Heart regular S1/S2, no S3/S4, no murmur.  1+ ankle edema.  Abdomen: Soft, nontender, no hepatosplenomegaly, no distention.  Skin: Intact without lesions or rashes.  Neurologic: Alert and oriented x 3.  Psych: Normal affect. Extremities: No clubbing or cyanosis.  HEENT: Normal.   Telemetry   NSR 80s personally reviewed.  Labs    CBC Recent Labs    08/22/23 0523 08/23/23 0429  WBC 9.1 7.1  HGB 9.5* 8.9*  HCT 29.4*  27.2*  MCV 89.1 89.2  PLT 409* 414*   Basic Metabolic Panel Recent Labs    40/98/11 0510 08/22/23 0523 08/23/23 0429  NA 132* 135 136  K 4.5 4.1 4.6  CL 95* 99 101  CO2 27 28 25   GLUCOSE 152* 144* 128*  BUN 29* 30* 30*  CREATININE 1.05* 1.05* 1.22*  CALCIUM  8.0* 8.4* 8.4*  MG 1.8  --   --    Imaging   No results found.     Medications:    Scheduled Medications:  sodium chloride    Intravenous Once   anastrozole   1 mg Oral Daily   aspirin   81 mg Oral Daily   Chlorhexidine  Gluconate Cloth  6 each Topical Daily   digoxin  0.125 mg Per Tube Daily   feeding supplement  237 mL Oral BID BM   fiber supplement (BANATROL TF)  60 mL Per Tube QID   insulin  aspart  0-24 Units  Subcutaneous Q4H   insulin  aspart  5 Units Subcutaneous TID WC   insulin  glargine-yfgn  30 Units Subcutaneous BID   pantoprazole  (PROTONIX ) IV  40 mg Intravenous Daily   sodium chloride  flush  10-40 mL Intracatheter Q12H   spironolactone   25 mg Oral Daily   torsemide  40 mg Oral BID   Warfarin - Pharmacist Dosing Inpatient   Does not apply q1600   Infusions:  amiodarone  30 mg/hr (08/22/23 1900)   heparin  1,400 Units/hr (08/22/23 1900)   penicillin  G potassium 12 Million Units in sodium chloride  0.9 % 500 mL CONTINUOUS infusion 12 Million Units (08/23/23 0900)   PRN Medications: clonazePAM , dextrose , Gerhardt's butt cream, melatonin, morphine  injection, ondansetron  (ZOFRAN ) IV, mouth rinse, oxyCODONE , prochlorperazine , senna-docusate, sodium chloride  flush, traMADol   Assessment/Plan   1. Cardiogenic shock: Valvular shock from severe MR.  TEE on 5/5 showed LV EF 65%, normal RV function, severe MR with perforated leaflet and mobile vegetation.  Patient developed flash pulmonary edema 5/9 in setting of marked hypertension -> IABP placed. - s/p mMVR 08/14/23 - CVP 10 today, will give Lasix  40 mg IV bid and resume torsemide 40 mg po bid tomorrow.  - Milrinone  stopped 5/22. Co-ox ok 55%. Follow for 1 more  day  - Continue spironolactone  25 mg daily - Continue digoxin 0.125 mg daily  - Will get complete post-op echo now that she is back in NSR.   2. Mitral regurgitation: Severe on TEE with perforated anterior leaflet and mitral valve vegetation.  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  - s/p On-X mechanical MVR with Dr. Luna Salinas 08/14/23 - post-op antibiotics per ID. On PenG, will require 6 weeks IV abx (start date 05/14). - Warfarin / heparin  bridging per CT surgery, goal INR 2.5 -3.5.  Can stop heparin  gtt today if INR > 2.  Also on ASA 81.   3. Acute hypercarbic/hypoxemic respiratory failure in setting of #1&2 - Resolved - Encouraged IS   4. Atrial flutter with RVR - S/p DCCV 5/23. Maintaining NSR  - transition to po amiodarone .  - Warfarin/ heparin  gtt as above  5. Morbid obesity - Body mass index is 42.05 kg/m.  6. Hypervolemic Hyponatremia - resolved w/ diuresis.    7. Anemia - hgb 8.9 today.  - labs c/w IDA but given acute infection will hold off on IV Fe   Ok from my standpoint to transfer to progressive.    Length of Stay: 11  Peder Bourdon, MD  08/23/2023, 9:24 AM  Advanced Heart Failure Team Pager [phone] (M-F; 7a - 5p)  Please contact [facility] for night-coverage after hours (5p -7a ) and weekends on [email or web address]

## 2023-08-23 NOTE — Progress Notes (Addendum)
 Patient's CBG was 49 only upon pt arrival to the unit, rechecked on her ear lobes 70mg /dl only, she was asymptomatic. Simple carbs provided, will recheck CBG.  CBG rechecked 113mg /dl.  5621 Foley removed as per the order, educated the patient  need to void within 4-6hrs, will continue to monitor.

## 2023-08-23 NOTE — Progress Notes (Signed)
 Pt arrived from ...2H.., A/ox .4.Marland Kitchenpt denies any pain, MD aware,CCMD called. CHG bath given,no further needs at this time

## 2023-08-23 NOTE — Progress Notes (Signed)
      [address]       [place] [address]             [phone]                 1 Day Post-Op Procedure(s) (LRB): TRANSESOPHAGEAL ECHOCARDIOGRAM (N/A) CARDIOVERSION (N/A)   Events: No events Doing well _______________________________________________________________ Vitals: BP (!) 148/84   Pulse 81   Temp 98.5 F (36.9 C) (Oral)   Resp 16   Ht 5\' 5"  (1.651 m)   Wt 114.6 kg   SpO2 95%   BMI 42.05 kg/m  Filed Weights   08/21/23 0500 08/22/23 0632 08/23/23 0500  Weight: 118.1 kg 118.8 kg 114.6 kg     - Neuro: alert NAd  - Cardiovascular: sinus  Drips: amio 30.   CVP:  [6 mmHg-7 mmHg] 7 mmHg  - Pulm: EWOB    ABG    Component Value Date/Time   PHART 7.465 (H) 08/14/2023 1700   PCO2ART 35.1 08/14/2023 1700   PO2ART 65 (L) 08/14/2023 1700   HCO3 25.0 08/14/2023 1700   TCO2 26 08/14/2023 1700   ACIDBASEDEF 1.0 08/08/2023 1634   O2SAT 55.2 08/23/2023 0638    - Abd: ND - Extremity: warm  .Intake/Output      05/23 0701 05/24 0700 05/24 0701 05/25 0700   P.O. 687    I.V. (mL/kg) 573.2 (5)    Blood     NG/GT     IV Piggyback 517.8    Total Intake(mL/kg) 1778 (15.5)    Urine (mL/kg/hr) 2075 (0.8)    Stool 0    Total Output 2075    Net -297         Stool Occurrence 2 x       _______________________________________________________________ Labs:    Latest Ref Rng & Units 08/23/2023    4:29 AM 08/22/2023    5:23 AM 08/21/2023    5:10 AM  CBC  WBC 4.0 - 10.5 K/uL 7.1  9.1  7.5   Hemoglobin 12.0 - 15.0 g/dL 8.9  9.5  7.5   Hematocrit 36.0 - 46.0 % 27.2  29.4  23.6   Platelets 150 - 400 K/uL 414  409  329       Latest Ref Rng & Units 08/23/2023    4:29 AM 08/22/2023    5:23 AM 08/21/2023    5:10 AM  CMP  Glucose 70 - 99 mg/dL 657  846  962   BUN 8 - 23 mg/dL 30  30  29    Creatinine 0.44 - 1.00 mg/dL 9.52  8.41  3.24   Sodium 135 - 145 mmol/L 136  135  132   Potassium 3.5 - 5.1 mmol/L 4.6  4.1  4.5   Chloride 98 - 111 mmol/L  101  99  95   CO2 22 - 32 mmol/L 25  28  27    Calcium  8.9 - 10.3 mg/dL 8.4  8.4  8.0     CXR: -  _______________________________________________________________  Assessment and Plan: POD 10 s/p MVR  Neuro: pain controlled CV: convert to PO amio.   Pulm: pulm hygiene Renal: diuresing, will remove foley GI: on diet Heme: will d/c heparin  gtt ID: on abx Endo: SSI Dispo: floor   Melissa Bray 08/23/2023 10:08 AM

## 2023-08-23 NOTE — Progress Notes (Signed)
 PHARMACY - ANTICOAGULATION CONSULT NOTE  Pharmacy Consult for warfarin + heparin  Indication: onX mMVR  No Known Allergies  Patient Measurements: Height: 5\' 5"  (165.1 cm) Weight: 114.6 kg (252 lb 11.2 oz) IBW/kg (Calculated) : 57 HEPARIN  DW (KG): 84.3  Vital Signs: Temp: 98.5 F (36.9 C) (05/24 0800) Temp Source: Oral (05/24 0800) BP: 148/84 (05/24 0800) Pulse Rate: 81 (05/24 0800)  Labs: Recent Labs    08/21/23 0510 08/21/23 0710 08/22/23 0523 08/23/23 0429 08/23/23 0910  HGB 7.5*  --  9.5* 8.9*  --   HCT 23.6*  --  29.4* 27.2*  --   PLT 329  --  409* 414*  --   LABPROT 20.8*  --  19.4* 25.2* 23.4*  INR 1.8*  --  1.6* 2.3* 2.1*  HEPARINUNFRC >1.10*   < > 0.46 >1.10* 0.36  CREATININE 1.05*  --  1.05* 1.22*  --    < > = values in this interval not displayed.    Estimated Creatinine Clearance: 58.8 mL/min (A) (by C-G formula based on SCr of 1.22 mg/dL (H)).   Medical History: Past Medical History:  Diagnosis Date   Anxiety    Breast cancer (HCC)    Diabetes mellitus without complication (HCC)    Hyperlipidemia    Hypertension    Type 2 diabetes mellitus without retinopathy (HCC) 06/17/2017    Medications:  Scheduled:   sodium chloride    Intravenous Once   amiodarone   400 mg Oral BID   anastrozole   1 mg Oral Daily   aspirin   81 mg Oral Daily   Chlorhexidine  Gluconate Cloth  6 each Topical Daily   [place] [facility], Patient & Family Education   Does not apply Once   digoxin  0.125 mg Per Tube Daily   feeding supplement  237 mL Oral BID BM   fiber supplement (BANATROL TF)  60 mL Per Tube QID   furosemide   40 mg Intravenous BID   insulin  aspart  0-24 Units Subcutaneous Q4H   insulin  aspart  5 Units Subcutaneous TID WC   insulin  glargine-yfgn  30 Units Subcutaneous BID   pantoprazole  (PROTONIX ) IV  40 mg Intravenous Daily   sodium chloride  flush  10-40 mL Intracatheter Q12H   sodium chloride  flush  3 mL Intravenous Q12H   spironolactone   25 mg  Oral Daily   warfarin  10 mg Oral ONCE-1600   Warfarin - Pharmacist Dosing Inpatient   Does not apply q1600    Assessment: 69 yof presenting with acute mitral valve endocarditis with perforated mitral leaflet and severe MR. Underwent dental extraction 5/8. Presented for pre-op RHC and developed flash pulmonary edema requiring intubation - IABP was placed in lab and IV heparin  started. No AC PTA.  Pt now s/p cardiac surgery with onX mMVR placed. Pharmacy consulted to start warfarin 5/16 after IABP removal 5/15. INR dropped to 1.5 with TEE/DCCV planned later this week so IV heparin  started (ok with surgery since 7 days postop).   Initial heparin  level this AM   Goal of Therapy:  INR 2.5-3.5 Heparin  level 0.3-0.5 Monitor platelets by anticoagulation protocol: Yes   Plan:  Warfarin 10 mg x1 tonight OK to stop heparin  today per Dr. Mitzie Anda and [facility] Daily INR.  Joanell Mowers, Davey Erp, [facility] Clinical Pharmacist  08/23/2023 10:32 AM   [facility] pharmacy phone numbers are listed on [email or web address]

## 2023-08-23 NOTE — Progress Notes (Signed)
 Echocardiogram 2D Echocardiogram has been performed.  Melissa Bray 08/23/2023, 10:52 AM

## 2023-08-24 DIAGNOSIS — I059 Rheumatic mitral valve disease, unspecified: Secondary | ICD-10-CM | POA: Diagnosis not present

## 2023-08-24 LAB — BASIC METABOLIC PANEL WITH GFR
Anion gap: 10 (ref 5–15)
BUN: 22 mg/dL (ref 8–23)
CO2: 25 mmol/L (ref 22–32)
Calcium: 9.2 mg/dL (ref 8.9–10.3)
Chloride: 102 mmol/L (ref 98–111)
Creatinine, Ser: 1.08 mg/dL — ABNORMAL HIGH (ref 0.44–1.00)
GFR, Estimated: 57 mL/min — ABNORMAL LOW (ref >60)
Glucose, Bld: 145 mg/dL — ABNORMAL HIGH (ref 70–99)
Potassium: 3.4 mmol/L — ABNORMAL LOW (ref 3.5–5.1)
Sodium: 137 mmol/L (ref 135–145)

## 2023-08-24 LAB — CBC
HCT: 31.6 % — ABNORMAL LOW (ref 36.0–46.0)
Hemoglobin: 10 g/dL — ABNORMAL LOW (ref 12.0–15.0)
MCH: 28 pg (ref 26.0–34.0)
MCHC: 31.6 g/dL (ref 30.0–36.0)
MCV: 88.5 fL (ref 80.0–100.0)
Platelets: 463 10*3/uL — ABNORMAL HIGH (ref 150–400)
RBC: 3.57 MIL/uL — ABNORMAL LOW (ref 3.87–5.11)
RDW: 14.6 % (ref 11.5–15.5)
WBC: 5.9 10*3/uL (ref 4.0–10.5)
nRBC: 0 % (ref 0.0–0.2)

## 2023-08-24 LAB — GLUCOSE, CAPILLARY
Glucose-Capillary: 104 mg/dL — ABNORMAL HIGH (ref 70–99)
Glucose-Capillary: 108 mg/dL — ABNORMAL HIGH (ref 70–99)
Glucose-Capillary: 113 mg/dL — ABNORMAL HIGH (ref 70–99)
Glucose-Capillary: 147 mg/dL — ABNORMAL HIGH (ref 70–99)
Glucose-Capillary: 158 mg/dL — ABNORMAL HIGH (ref 70–99)
Glucose-Capillary: 247 mg/dL — ABNORMAL HIGH (ref 70–99)
Glucose-Capillary: 59 mg/dL — ABNORMAL LOW (ref 70–99)
Glucose-Capillary: 82 mg/dL (ref 70–99)

## 2023-08-24 LAB — PROTIME-INR
INR: 2.7 — ABNORMAL HIGH (ref 0.8–1.2)
Prothrombin Time: 28.6 s — ABNORMAL HIGH (ref 11.4–15.2)

## 2023-08-24 LAB — MAGNESIUM: Magnesium: 2.3 mg/dL (ref 1.7–2.4)

## 2023-08-24 MED ORDER — TRAMADOL HCL 50 MG PO TABS
50.0000 mg | ORAL_TABLET | ORAL | Status: DC | PRN
  Administered 2023-08-24: 100 mg via ORAL
  Filled 2023-08-24: qty 2

## 2023-08-24 MED ORDER — SENNOSIDES-DOCUSATE SODIUM 8.6-50 MG PO TABS: 1.0000 | ORAL_TABLET | Freq: Every evening | ORAL | Status: DC | PRN

## 2023-08-24 MED ORDER — OXYCODONE HCL 5 MG PO TABS
5.0000 mg | ORAL_TABLET | ORAL | Status: DC | PRN
  Administered 2023-08-24 – 2023-08-27 (×4): 5 mg via ORAL
  Administered 2023-08-28: 10 mg via ORAL
  Filled 2023-08-24 (×3): qty 1
  Filled 2023-08-24: qty 2
  Filled 2023-08-24: qty 1

## 2023-08-24 MED ORDER — MELATONIN 5 MG PO TABS
5.0000 mg | ORAL_TABLET | Freq: Every evening | ORAL | Status: DC | PRN
  Administered 2023-08-25 – 2023-08-28 (×3): 5 mg via ORAL
  Filled 2023-08-24 (×3): qty 1

## 2023-08-24 MED ORDER — WARFARIN SODIUM 5 MG PO TABS
6.0000 mg | ORAL_TABLET | Freq: Once | ORAL | Status: AC
  Administered 2023-08-24: 6 mg via ORAL
  Filled 2023-08-24: qty 1

## 2023-08-24 MED ORDER — BANATROL TF EN LIQD
60.0000 mL | Freq: Four times a day (QID) | ENTERAL | Status: DC
  Administered 2023-08-25 – 2023-08-29 (×10): 60 mL via ORAL
  Filled 2023-08-24 (×12): qty 60

## 2023-08-24 MED ORDER — POTASSIUM CHLORIDE CRYS ER 10 MEQ PO TBCR
30.0000 meq | EXTENDED_RELEASE_TABLET | Freq: Two times a day (BID) | ORAL | Status: DC
  Administered 2023-08-24 (×2): 30 meq via ORAL
  Filled 2023-08-24 (×2): qty 1

## 2023-08-24 MED ORDER — TORSEMIDE 20 MG PO TABS
40.0000 mg | ORAL_TABLET | Freq: Two times a day (BID) | ORAL | Status: DC
  Administered 2023-08-24: 40 mg via ORAL
  Filled 2023-08-24: qty 2

## 2023-08-24 MED ORDER — CLONAZEPAM 0.5 MG PO TABS
0.5000 mg | ORAL_TABLET | Freq: Two times a day (BID) | ORAL | Status: DC | PRN
  Administered 2023-08-25 – 2023-08-26 (×3): 0.5 mg via ORAL
  Filled 2023-08-24 (×3): qty 1

## 2023-08-24 MED ORDER — PANTOPRAZOLE SODIUM 40 MG PO TBEC
40.0000 mg | DELAYED_RELEASE_TABLET | Freq: Every day | ORAL | Status: DC
Start: 2023-08-25 — End: 2023-08-29
  Administered 2023-08-25 – 2023-08-29 (×5): 40 mg via ORAL
  Filled 2023-08-24 (×5): qty 1

## 2023-08-24 NOTE — Progress Notes (Signed)
 PHARMACY - ANTICOAGULATION CONSULT NOTE  Pharmacy Consult for warfarin Indication: onX mMVR  No Known Allergies  Patient Measurements: Height: 5\' 5"  (165.1 cm) Weight: 117.3 kg (258 lb 9.6 oz) IBW/kg (Calculated) : 57 HEPARIN  DW (KG): 84.3  Vital Signs: Temp: 98.5 F (36.9 C) (05/25 0754) Temp Source: Oral (05/25 0754) BP: 154/66 (05/25 0754) Pulse Rate: 77 (05/25 0754)  Labs: Recent Labs    08/22/23 0523 08/23/23 0429 08/23/23 0910 08/24/23 0515  HGB 9.5* 8.9*  --  10.0*  HCT 29.4* 27.2*  --  31.6*  PLT 409* 414*  --  463*  LABPROT 19.4* 25.2* 23.4* 28.6*  INR 1.6* 2.3* 2.1* 2.7*  HEPARINUNFRC 0.46 >1.10* 0.36  --   CREATININE 1.05* 1.22*  --  1.08*    Estimated Creatinine Clearance: 67.4 mL/min (A) (by C-G formula based on SCr of 1.08 mg/dL (H)).   Medical History: Past Medical History:  Diagnosis Date   Anxiety    Breast cancer (HCC)    Diabetes mellitus without complication (HCC)    Hyperlipidemia    Hypertension    Type 2 diabetes mellitus without retinopathy (HCC) 06/17/2017    Medications:  Scheduled:   sodium chloride    Intravenous Once   amiodarone   400 mg Oral BID   anastrozole   1 mg Oral Daily   aspirin   81 mg Oral Daily   Chlorhexidine  Gluconate Cloth  6 each Topical Daily   [place] [facility]iac Surgery, Patient & Family Education   Does not apply Once   digoxin  0.125 mg Per Tube Daily   feeding supplement  237 mL Oral BID BM   fiber supplement (BANATROL TF)  60 mL Per Tube QID   furosemide   40 mg Intravenous BID   insulin  aspart  0-24 Units Subcutaneous Q4H   insulin  aspart  5 Units Subcutaneous TID WC   insulin  glargine-yfgn  30 Units Subcutaneous BID   pantoprazole  (PROTONIX ) IV  40 mg Intravenous Daily   sodium chloride  flush  10-40 mL Intracatheter Q12H   sodium chloride  flush  3 mL Intravenous Q12H   spironolactone   25 mg Oral Daily   Warfarin - Pharmacist Dosing Inpatient   Does not apply q1600    Assessment: 55 yof  presenting with acute mitral valve endocarditis with perforated mitral leaflet and severe MR. Underwent dental extraction 5/8. Presented for pre-op RHC and developed flash pulmonary edema requiring intubation - IABP was placed in lab and IV heparin  started. No AC PTA.  Pt now s/p cardiac surgery with onX mMVR placed. Pharmacy consulted to start warfarin 5/16 after IABP removal 5/15. S/p DCCV 5/23 and maintaining NSR. Heparin  bridge dc'd once INR > 2.  INR increased from 2.1 to 2.6, which is now in therapeutic range. Will give lower warfarin dose since patient received increased doses the past 2 days and anticipating INR to continue to increase. CBC stable.  Goal of Therapy:  INR 2.5-3.5 Monitor platelets by anticoagulation protocol: Yes   Plan:  Warfarin 6 mg x1 tonight Daily INR and CBC  Abelina Abide, PharmD PGY1 Pharmacy Resident 08/24/2023 8:05 AM

## 2023-08-24 NOTE — Progress Notes (Signed)
 Patient ID: Melissa Bray, female   DOB: [birth year], 63 y.o.   MRN: [id number]                                                                                               Advanced Heart Failure Rounding Note   Chief Complaint: CHF  Subjective:    63 y/o obese woman admitted with cardiogenic shock in setting of severe MR due to endocarditis and perforation of anterior MV leaflet. Underwent On-X mechanical MVR on 08/14/23. Post-op course c/b RV Failure  POD #11 s/p MVR  Creatinine down to 1.08. CVP 7 today.    DCCV to NSR 5/23, she remains in NSR now on po amiodarone .  INR 2.7 on warfarin, off heparin  gtt.     Echo (5/24): EF 55-60%, mildly D-shaped ventricle with mild RV dysfunction and mild RV enlargement, IVC dilated, On-X mechanical MV functioning normally.    Objective:    Weight Range: 117.3 kg Body mass index is 43.03 kg/m.   Vital Signs:   Temp:  [97.6 F (36.4 C)-99 F (37.2 C)] 98.5 F (36.9 C) (05/25 0754) Pulse Rate:  [77-87] 77 (05/25 0754) Resp:  [13-20] 20 (05/25 0754) BP: (93-164)/(63-82) 154/66 (05/25 0754) SpO2:  [97 %-100 %] 98 % (05/25 0352) Weight:  [117.3 kg] 117.3 kg (05/25 0506) Last BM Date : 08/23/23  Weight change: Filed Weights   08/22/23 0632 08/23/23 0500 08/24/23 0506  Weight: 118.8 kg 114.6 kg 117.3 kg   Intake/Output:  Intake/Output Summary (Last 24 hours) at 08/24/2023 1059 Last data filed at 08/23/2023 1747 Gross per 24 hour  Intake 101.28 ml  Output 600 ml  Net -498.72 ml    Physical Exam   General: NAD Neck: No JVD, no thyromegaly or thyroid  nodule.  Lungs: Clear to auscultation bilaterally with normal respiratory effort. CV: Nondisplaced PMI.  Heart regular S1/S2, mechanical S1, no murmur.  No peripheral edema.    Abdomen: Soft, nontender, no hepatosplenomegaly, no distention.  Skin: Intact without lesions or rashes.  Neurologic: Alert and oriented x 3.  Psych: Normal affect. Extremities: No clubbing or cyanosis.  HEENT:  Normal.   Telemetry   NSR 80s personally reviewed.  Labs    CBC Recent Labs    08/23/23 0429 08/24/23 0515  WBC 7.1 5.9  HGB 8.9* 10.0*  HCT 27.2* 31.6*  MCV 89.2 88.5  PLT 414* 463*   Basic Metabolic Panel Recent Labs    21/30/86 0429 08/24/23 0515  NA 136 137  K 4.6 3.4*  CL 101 102  CO2 25 25  GLUCOSE 128* 145*  BUN 30* 22  CREATININE 1.22* 1.08*  CALCIUM  8.4* 9.2  MG  --  2.3   Imaging   No results found.     Medications:    Scheduled Medications:  sodium chloride    Intravenous Once   amiodarone   400 mg Oral BID   anastrozole   1 mg Oral Daily   aspirin   81 mg Oral Daily   Chlorhexidine  Gluconate Cloth  6 each Topical Daily   [facility], Patient & Family Education   Does not  apply Once   feeding supplement  237 mL Oral BID BM   fiber supplement (BANATROL TF)  60 mL Per Tube QID   insulin  aspart  0-24 Units Subcutaneous Q4H   insulin  aspart  5 Units Subcutaneous TID WC   insulin  glargine-yfgn  30 Units Subcutaneous BID   pantoprazole  (PROTONIX ) IV  40 mg Intravenous Daily   potassium chloride   30 mEq Oral BID   sodium chloride  flush  10-40 mL Intracatheter Q12H   sodium chloride  flush  3 mL Intravenous Q12H   spironolactone   25 mg Oral Daily   warfarin  6 mg Oral ONCE-1600   Warfarin - Pharmacist Dosing Inpatient   Does not apply q1600   Infusions:  penicillin  G potassium 12 Million Units in sodium chloride  0.9 % 500 mL CONTINUOUS infusion 12 Million Units (08/24/23 0444)   PRN Medications: clonazePAM , dextrose , Gerhardt's butt cream, melatonin, morphine  injection, ondansetron  (ZOFRAN ) IV, mouth rinse, oxyCODONE , prochlorperazine , senna-docusate, sodium chloride  flush, sodium chloride  flush, traMADol   Assessment/Plan   1. Cardiogenic shock: Valvular shock from severe MR.  TEE on 5/5 showed LV EF 65%, normal RV function, severe MR with perforated leaflet and mobile vegetation.  Patient developed flash pulmonary edema 5/9 in  setting of marked hypertension -> IABP placed. - s/p mMVR 08/14/23 - Echo 5/24: EF 55-60%, mildly D-shaped ventricle with mild RV dysfunction and mild RV enlargement, IVC dilated, On-X mechanical MV functioning normally.  - CVP 7 today, resume torsemide 40 mg po bid.  - Milrinone  stopped 5/22, stable.   - Continue spironolactone  25 mg daily - Think we can stop digoxin.   2. Mitral regurgitation: Severe on TEE with perforated anterior leaflet and mitral valve vegetation.  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  - s/p On-X mechanical MVR with Dr. Luna Salinas 08/14/23 - post-op antibiotics per ID. On PenG, will require 6 weeks IV abx (start date 05/14). - Now on warfarin, goal INR 2.5 -3.5. Also on ASA 81.   3. Acute hypercarbic/hypoxemic respiratory failure in setting of #1&2 - Resolved - Encouraged IS   4. Atrial flutter with RVR - S/p DCCV 5/23. Maintaining NSR  - transitioned to po amiodarone .  - Continue warfarin as above.   5. Morbid obesity - Body mass index is 43.03 kg/m.  6. Hypervolemic Hyponatremia - resolved w/ diuresis.    7. Anemia - hgb 10 today.  - labs c/w IDA but given acute infection will hold off on IV Fe   Mobilize.   Length of Stay: 22  Peder Bourdon, MD  08/24/2023, 10:59 AM  Advanced Heart Failure Team Pager [phone] (M-F; 7a - 5p)  Please contact [facility] for night-coverage after hours (5p -7a ) and weekends on [email or web address]

## 2023-08-24 NOTE — Progress Notes (Signed)
 Mobility Specialist Progress Note:   08/24/23 0924  Mobility  Activity Transferred to/from BSC;Dangled on edge of bed  Level of Assistance Minimal assist, patient does 75% or more  Assistive Device BSC  Distance Ambulated (ft) 4 ft  RUE Weight Bearing Per Provider Order NWB  LUE Weight Bearing Per Provider Order NWB  Activity Response Tolerated well  Mobility Referral Yes  Mobility visit 1 Mobility  Mobility Specialist Start Time (ACUTE ONLY) 0920  Mobility Specialist Stop Time (ACUTE ONLY) [id number]  Mobility Specialist Time Calculation (min) (ACUTE ONLY) 4 min   Pt received danlging EOB, requesting assistance to transfer to/from [facility]. Tolerated well, MinA to stand. Void successful, peri care completed independently. Returned pt to bed, lying down comfortably with all needs met, call bell and phone in reach.   Marton Malizia Mobility Specialist Please contact via Special educational needs teacher or  Rehab office at [phone]

## 2023-08-24 NOTE — Progress Notes (Addendum)
 2 Days Post-Op Procedure(s) (LRB): TRANSESOPHAGEAL ECHOCARDIOGRAM (N/A) CARDIOVERSION (N/A) Subjective: Conts to feel better, no specific c/o  Objective: Vital signs in last 24 hours: Temp:  [97.6 F (36.4 C)-99 F (37.2 C)] 98.5 F (36.9 C) (05/25 0754) Pulse Rate:  [77-87] 77 (05/25 0754) Cardiac Rhythm: Normal sinus rhythm;Heart block (05/24 1917) Resp:  [13-20] 20 (05/25 0754) BP: (93-164)/(63-84) 154/66 (05/25 0754) SpO2:  [97 %-100 %] 98 % (05/25 0352) Weight:  [117.3 kg] 117.3 kg (05/25 0506)  Hemodynamic parameters for last 24 hours: CVP:  [7 mmHg-22 mmHg] 10 mmHg  Intake/Output from previous day: 05/24 0701 - 05/25 0700 In: 1002.9 [I.V.:319; IV Piggyback:683.9] Out: 800 [Urine:800] Intake/Output this shift: No intake/output data recorded.  General appearance: alert, cooperative, and no distress Heart: regular rate and rhythm Lungs: clear to auscultation bilaterally Abdomen: benign Extremities: no edema Wound: incis healing well  Lab Results: Recent Labs    08/23/23 0429 08/24/23 0515  WBC 7.1 5.9  HGB 8.9* 10.0*  HCT 27.2* 31.6*  PLT 414* 463*   BMET:  Recent Labs    08/23/23 0429 08/24/23 0515  NA 136 137  K 4.6 3.4*  CL 101 102  CO2 25 25  GLUCOSE 128* 145*  BUN 30* 22  CREATININE 1.22* 1.08*  CALCIUM  8.4* 9.2    PT/INR:  Recent Labs    08/24/23 0515  LABPROT 28.6*  INR 2.7*   ABG    Component Value Date/Time   PHART 7.465 (H) 08/14/2023 1700   HCO3 25.0 08/14/2023 1700   TCO2 26 08/14/2023 1700   ACIDBASEDEF 1.0 08/08/2023 1634   O2SAT 55.2 08/23/2023 0638   CBG (last 3)  Recent Labs    08/24/23 0002 08/24/23 0352 08/24/23 0756  GLUCAP 147* 113* 158*    Meds Scheduled Meds:  sodium chloride    Intravenous Once   amiodarone   400 mg Oral BID   anastrozole   1 mg Oral Daily   aspirin   81 mg Oral Daily   Chlorhexidine  Gluconate Cloth  6 each Topical Daily   [facility], Patient & Family Education   Does  not apply Once   digoxin  0.125 mg Per Tube Daily   feeding supplement  237 mL Oral BID BM   fiber supplement (BANATROL TF)  60 mL Per Tube QID   furosemide   40 mg Intravenous BID   insulin  aspart  0-24 Units Subcutaneous Q4H   insulin  aspart  5 Units Subcutaneous TID WC   insulin  glargine-yfgn  30 Units Subcutaneous BID   pantoprazole  (PROTONIX ) IV  40 mg Intravenous Daily   sodium chloride  flush  10-40 mL Intracatheter Q12H   sodium chloride  flush  3 mL Intravenous Q12H   spironolactone   25 mg Oral Daily   Warfarin - Pharmacist Dosing Inpatient   Does not apply q1600   Continuous Infusions:  sodium chloride      penicillin  G potassium 12 Million Units in sodium chloride  0.9 % 500 mL CONTINUOUS infusion 12 Million Units (08/24/23 0444)   PRN Meds:.sodium chloride , clonazePAM , dextrose , Gerhardt's butt cream, melatonin, morphine  injection, ondansetron  (ZOFRAN ) IV, mouth rinse, oxyCODONE , prochlorperazine , senna-docusate, sodium chloride  flush, sodium chloride  flush, traMADol   Xrays ECHOCARDIOGRAM COMPLETE Result Date: 08/23/2023    ECHOCARDIOGRAM REPORT   Patient Name:   Melissa Bray Date of Exam: 08/23/2023 Medical Rec #:  [id number]     Height:       65.0 in Accession #:    [id number]    Weight:  252.7 lb Date of Birth:  1960-09-23     BSA:          2.185 m Patient Age:    62 years      BP:           148/84 mmHg Patient Gender: F             HR:           81 bpm. Exam Location:  Inpatient Procedure: 2D Echo, Cardiac Doppler and Color Doppler (Both Spectral and Color            Flow Doppler were utilized during procedure). Indications:    CHF- Acute Systolic I50.21  History:        Patient has prior history of Echocardiogram examinations, most                 recent 08/03/2023. Arrythmias:Tachycardia; Risk                 Factors:Hypertension, Diabetes and Dyslipidemia.  Sonographer:    Terrilee Few RCS Referring Phys: [phone] DALTON S MCLEAN IMPRESSIONS  1. Left ventricular ejection  fraction, by estimation, is 55 to 60%. The left ventricle has normal function. The left ventricle has no regional wall motion abnormalities. There is mild concentric left ventricular hypertrophy. Left ventricular diastolic parameters are indeterminate.  2. Mildly D-shaped interventricular septum suggestive of a degree of pressure/volume overload. Right ventricular systolic function is mildly reduced. The right ventricular size is mildly enlarged. There is mildly elevated pulmonary artery systolic pressure. The estimated right ventricular systolic pressure is 40.6 mmHg.  3. Left atrial size was mildly dilated.  4. On-X mechanical mitral valve with mean gradient 3 mmHg. No significant regurgitation.  5. The aortic valve is tricuspid. Aortic valve regurgitation is not visualized. No aortic stenosis is present.  6. The inferior vena cava is dilated in size with <50% respiratory variability, suggesting right atrial pressure of 15 mmHg.  7. Moderate pericardial effusion. There is no evidence of cardiac tamponade. FINDINGS  Left Ventricle: Left ventricular ejection fraction, by estimation, is 55 to 60%. The left ventricle has normal function. The left ventricle has no regional wall motion abnormalities. The left ventricular internal cavity size was normal in size. There is  mild concentric left ventricular hypertrophy. Left ventricular diastolic parameters are indeterminate. Right Ventricle: Mildly D-shaped interventricular septum suggestive of a degree of pressure/volume overload. The right ventricular size is mildly enlarged. No increase in right ventricular wall thickness. Right ventricular systolic function is mildly reduced. There is mildly elevated pulmonary artery systolic pressure. The tricuspid regurgitant velocity is 2.53 m/s, and with an assumed right atrial pressure of 15 mmHg, the estimated right ventricular systolic pressure is 40.6 mmHg. Left Atrium: Left atrial size was mildly dilated. Right Atrium: Right  atrial size was normal in size. Pericardium: A moderately sized pericardial effusion is present. There is no evidence of cardiac tamponade. Mitral Valve: On-X mechanical mitral valve with mean gradient 3 mmHg. No significant regurgitation. The mitral valve has been repaired/replaced. No evidence of mitral valve regurgitation. MV peak gradient, 7.2 mmHg. The mean mitral valve gradient is 2.5 mmHg. Tricuspid Valve: The tricuspid valve is normal in structure. Tricuspid valve regurgitation is mild. Aortic Valve: The aortic valve is tricuspid. Aortic valve regurgitation is not visualized. No aortic stenosis is present. Aortic valve peak gradient measures 8.5 mmHg. Pulmonic Valve: The pulmonic valve was normal in structure. Pulmonic valve regurgitation is trivial. Aorta: The aortic root is normal in  size and structure. Venous: The inferior vena cava is dilated in size with less than 50% respiratory variability, suggesting right atrial pressure of 15 mmHg. IAS/Shunts: No atrial level shunt detected by color flow Doppler.  LEFT VENTRICLE PLAX 2D LVIDd:         3.90 cm LVIDs:         2.70 cm LV PW:         1.20 cm LV IVS:        0.80 cm LVOT diam:     1.80 cm LV SV:         36 LV SV Index:   16 LVOT Area:     2.54 cm  RIGHT VENTRICLE            IVC RV S prime:     7.05 cm/s  IVC diam: 1.80 cm TAPSE (M-mode): 1.4 cm LEFT ATRIUM             Index        RIGHT ATRIUM           Index LA diam:        4.70 cm 2.15 cm/m   RA Area:     10.30 cm LA Vol (A2C):   54.9 ml 25.13 ml/m  RA Volume:   27.70 ml  12.68 ml/m LA Vol (A4C):   73.4 ml 33.62 ml/m LA Biplane Vol: 63.3 ml 28.97 ml/m  AORTIC VALVE AV Area (Vmax): 1.59 cm AV Vmax:        146.00 cm/s AV Peak Grad:   8.5 mmHg LVOT Vmax:      91.50 cm/s LVOT Vmean:     57.700 cm/s LVOT VTI:       0.141 m  AORTA Ao Root diam: 3.10 cm Ao Asc diam:  3.60 cm MITRAL VALVE                TRICUSPID VALVE MV Area (PHT): 3.65 cm     TR Peak grad:   25.6 mmHg MV Area VTI:   1.24 cm     TR  Vmax:        253.00 cm/s MV Peak grad:  7.2 mmHg MV Mean grad:  2.5 mmHg     SHUNTS MV Vmax:       1.34 m/s     Systemic VTI:  0.14 m MV Vmean:      78.5 cm/s    Systemic Diam: 1.80 cm MV Decel Time: 208 msec MV E velocity: 141.00 cm/s MV A velocity: 54.00 cm/s MV E/A ratio:  2.61 Dalton McleanMD Electronically signed by Archer Bear Signature Date/Time: 08/23/2023/12:55:50 PM    Final    EP STUDY Result Date: 08/22/2023 See surgical note for result.   Assessment/Plan: S/P Procedure(s) (LRB): TRANSESOPHAGEAL ECHOCARDIOGRAM (N/A) CARDIOVERSION (N/A) POD#11  1 afeb, S BP 90's-160's, sinus w 1 dg AVB,  on amio/dig, s/p DCCV- AHF managing GDMT/Diuretics 2 sats good on RA 3 voiding , not all measured, weight up 3 kg from yesterday- doubt accurate 4 BS well controled 5 normal renal fxn today replace K+ for 3.4 and on diuretics 6 H/H trending higher 7 ID - cont current ABX as outlined 8 INR 2.7- pharm dosing for On x mech MVR 9 cont pulm hygiene and rehab      LOS: 22 days    Lindi Revering PA-C Pager [phone] 08/24/2023   Agree Continue PT/OT Dispo planning  Melissa Bray

## 2023-08-24 NOTE — Plan of Care (Incomplete)
 Problem: Education: Goal: Knowledge of General Education information will improve Description: Including pain rating scale, medication(s)/side effects and non-pharmacologic comfort measures Outcome: Progressing   Problem: Health Behavior/Discharge Planning: Goal: Ability to manage health-related needs will improve Outcome: Progressing   Problem: Clinical Measurements: Goal: Ability to maintain clinical measurements within normal limits will improve Outcome: Progressing Goal: Will remain free from infection Outcome: Progressing Goal: Diagnostic test results will improve Outcome: Progressing Goal: Respiratory complications will improve Outcome: Progressing Goal: Cardiovascular complication will be avoided Outcome: Progressing   Problem: Activity: Goal: Risk for activity intolerance will decrease Outcome: Progressing   Problem: Nutrition: Goal: Adequate nutrition will be maintained Outcome: Progressing   Problem: Coping: Goal: Level of anxiety will decrease Outcome: Progressing   Problem: Elimination: Goal: Will not experience complications related to bowel motility Outcome: Progressing Goal: Will not experience complications related to urinary retention Outcome: Progressing   Problem: Pain Managment: Goal: General experience of comfort will improve and/or be controlled Outcome: Progressing   Problem: Safety: Goal: Ability to remain free from injury will improve Outcome: Progressing   Problem: Skin Integrity: Goal: Risk for impaired skin integrity will decrease Outcome: Progressing   Problem: Education: Goal: Ability to describe self-care measures that may prevent or decrease complications (Diabetes Survival Skills Education) will improve Outcome: Progressing Goal: Individualized Educational Video(s) Outcome: Progressing   Problem: Coping: Goal: Ability to adjust to condition or change in health will improve Outcome: Progressing   Problem: Fluid  Volume: Goal: Ability to maintain a balanced intake and output will improve Outcome: Progressing   Problem: Health Behavior/Discharge Planning: Goal: Ability to identify and utilize available resources and services will improve Outcome: Progressing Goal: Ability to manage health-related needs will improve Outcome: Progressing   Problem: Metabolic: Goal: Ability to maintain appropriate glucose levels will improve Outcome: Progressing   Problem: Nutritional: Goal: Maintenance of adequate nutrition will improve Outcome: Progressing Goal: Progress toward achieving an optimal weight will improve Outcome: Progressing   Problem: Skin Integrity: Goal: Risk for impaired skin integrity will decrease Outcome: Progressing   Problem: Tissue Perfusion: Goal: Adequacy of tissue perfusion will improve Outcome: Progressing   Problem: Fluid Volume: Goal: Hemodynamic stability will improve Outcome: Progressing   Problem: Clinical Measurements: Goal: Signs and symptoms of infection will decrease Outcome: Progressing   Problem: Clinical Measurements: Goal: Ability to avoid or minimize complications of infection will improve Outcome: Progressing   Problem: Skin Integrity: Goal: Skin integrity will improve Outcome: Progressing   Problem: Education: Goal: Understanding of CV disease, CV risk reduction, and recovery process will improve Outcome: Progressing Goal: Individualized Educational Video(s) Outcome: Progressing   Problem: Activity: Goal: Ability to return to baseline activity level will improve Outcome: Progressing   Problem: Cardiovascular: Goal: Ability to achieve and maintain adequate cardiovascular perfusion will improve Outcome: Progressing Goal: Vascular access site(s) Level 0-1 will be maintained Outcome: Progressing   Problem: Health Behavior/Discharge Planning: Goal: Ability to safely manage health-related needs after discharge will improve Outcome: Progressing    Problem: Activity: Goal: Ability to tolerate increased activity will improve Outcome: Progressing   Problem: Respiratory: Goal: Ability to maintain a clear airway and adequate ventilation will improve Outcome: Progressing   Problem: Role Relationship: Goal: Method of communication will improve Outcome: Progressing   Problem: Cardiac: Goal: Ability to achieve and maintain adequate cardiopulmonary perfusion will improve Outcome: Progressing Goal: Vascular access site(s) Level 0-1 will be maintained Outcome: Progressing   Problem: Fluid Volume: Goal: Ability to achieve a balanced intake and output will improve Outcome: Progressing  Problem: Physical Regulation: Goal: Complications related to the disease process, condition or treatment will be avoided or minimized Outcome: Progressing

## 2023-08-25 DIAGNOSIS — I059 Rheumatic mitral valve disease, unspecified: Secondary | ICD-10-CM | POA: Diagnosis not present

## 2023-08-25 LAB — CBC
HCT: 30.6 % — ABNORMAL LOW (ref 36.0–46.0)
Hemoglobin: 9.6 g/dL — ABNORMAL LOW (ref 12.0–15.0)
MCH: 28.3 pg (ref 26.0–34.0)
MCHC: 31.4 g/dL (ref 30.0–36.0)
MCV: 90.3 fL (ref 80.0–100.0)
Platelets: 469 10*3/uL — ABNORMAL HIGH (ref 150–400)
RBC: 3.39 MIL/uL — ABNORMAL LOW (ref 3.87–5.11)
RDW: 14.6 % (ref 11.5–15.5)
WBC: 7 10*3/uL (ref 4.0–10.5)
nRBC: 0 % (ref 0.0–0.2)

## 2023-08-25 LAB — BASIC METABOLIC PANEL WITH GFR
Anion gap: 11 (ref 5–15)
Anion gap: 9 (ref 5–15)
BUN: 20 mg/dL (ref 8–23)
BUN: 19 mg/dL (ref 8–23)
CO2: 24 mmol/L (ref 22–32)
CO2: 23 mmol/L (ref 22–32)
Calcium: 9.1 mg/dL (ref 8.9–10.3)
Calcium: 8.6 mg/dL — ABNORMAL LOW (ref 8.9–10.3)
Chloride: 105 mmol/L (ref 98–111)
Chloride: 106 mmol/L (ref 98–111)
Creatinine, Ser: 1.15 mg/dL — ABNORMAL HIGH (ref 0.44–1.00)
Creatinine, Ser: 1.21 mg/dL — ABNORMAL HIGH (ref 0.44–1.00)
GFR, Estimated: 53 mL/min — ABNORMAL LOW (ref >60)
GFR, Estimated: 50 mL/min — ABNORMAL LOW (ref >60)
Glucose, Bld: 135 mg/dL — ABNORMAL HIGH (ref 70–99)
Glucose, Bld: 190 mg/dL — ABNORMAL HIGH (ref 70–99)
Potassium: 5.4 mmol/L — ABNORMAL HIGH (ref 3.5–5.1)
Potassium: 5.8 mmol/L — ABNORMAL HIGH (ref 3.5–5.1)
Sodium: 140 mmol/L (ref 135–145)
Sodium: 138 mmol/L (ref 135–145)

## 2023-08-25 LAB — GLUCOSE, CAPILLARY
Glucose-Capillary: 95 mg/dL (ref 70–99)
Glucose-Capillary: 170 mg/dL — ABNORMAL HIGH (ref 70–99)
Glucose-Capillary: 120 mg/dL — ABNORMAL HIGH (ref 70–99)
Glucose-Capillary: 165 mg/dL — ABNORMAL HIGH (ref 70–99)
Glucose-Capillary: 81 mg/dL (ref 70–99)

## 2023-08-25 LAB — MAGNESIUM: Magnesium: 2 mg/dL (ref 1.7–2.4)

## 2023-08-25 LAB — COOXEMETRY PANEL
Carboxyhemoglobin: 1.3 % (ref 0.5–1.5)
Methemoglobin: <0.7 % (ref 0.0–1.5)
O2 Saturation: 63.8 %
Total hemoglobin: 11.5 g/dL — ABNORMAL LOW (ref 12.0–16.0)

## 2023-08-25 LAB — PROTIME-INR
INR: 4.5 (ref 0.8–1.2)
Prothrombin Time: 42.9 s — ABNORMAL HIGH (ref 11.4–15.2)

## 2023-08-25 MED ORDER — TORSEMIDE 20 MG PO TABS
40.0000 mg | ORAL_TABLET | Freq: Every day | ORAL | Status: DC
  Administered 2023-08-25: 40 mg via ORAL
  Filled 2023-08-25 (×2): qty 2

## 2023-08-25 MED ORDER — ASPIRIN 81 MG PO TBEC
81.0000 mg | DELAYED_RELEASE_TABLET | Freq: Every day | ORAL | Status: DC
  Administered 2023-08-25: 81 mg via ORAL
  Filled 2023-08-25: qty 1

## 2023-08-25 MED ORDER — SPIRONOLACTONE 25 MG PO TABS: 25.0000 mg | ORAL_TABLET | Freq: Every day | ORAL | Status: DC

## 2023-08-25 NOTE — Progress Notes (Signed)
 Inpatient Rehab Admissions Coordinator:    Pt. Improving, off most drips. Will likely send her case to insurance once she has worked with therapies this week.   Wandalee Gust, MS, CCC-SLP Rehab Admissions Coordinator  [phone] (celll) [phone] (office)

## 2023-08-25 NOTE — Plan of Care (Signed)
 Problem: Education: Goal: Knowledge of General Education information will improve Description: Including pain rating scale, medication(s)/side effects and non-pharmacologic comfort measures Outcome: Progressing   Problem: Health Behavior/Discharge Planning: Goal: Ability to manage health-related needs will improve Outcome: Progressing   Problem: Clinical Measurements: Goal: Ability to maintain clinical measurements within normal limits will improve Outcome: Progressing Goal: Will remain free from infection Outcome: Progressing Goal: Diagnostic test results will improve Outcome: Progressing Goal: Respiratory complications will improve Outcome: Progressing Goal: Cardiovascular complication will be avoided Outcome: Progressing   Problem: Activity: Goal: Risk for activity intolerance will decrease Outcome: Progressing   Problem: Nutrition: Goal: Adequate nutrition will be maintained Outcome: Progressing   Problem: Coping: Goal: Level of anxiety will decrease Outcome: Progressing   Problem: Elimination: Goal: Will not experience complications related to bowel motility Outcome: Progressing Goal: Will not experience complications related to urinary retention Outcome: Progressing   Problem: Pain Managment: Goal: General experience of comfort will improve and/or be controlled Outcome: Progressing   Problem: Safety: Goal: Ability to remain free from injury will improve Outcome: Progressing   Problem: Skin Integrity: Goal: Risk for impaired skin integrity will decrease Outcome: Progressing   Problem: Education: Goal: Ability to describe self-care measures that may prevent or decrease complications (Diabetes Survival Skills Education) will improve Outcome: Progressing Goal: Individualized Educational Video(s) Outcome: Progressing   Problem: Coping: Goal: Ability to adjust to condition or change in health will improve Outcome: Progressing   Problem: Fluid  Volume: Goal: Ability to maintain a balanced intake and output will improve Outcome: Progressing   Problem: Health Behavior/Discharge Planning: Goal: Ability to identify and utilize available resources and services will improve Outcome: Progressing Goal: Ability to manage health-related needs will improve Outcome: Progressing   Problem: Metabolic: Goal: Ability to maintain appropriate glucose levels will improve Outcome: Progressing   Problem: Nutritional: Goal: Maintenance of adequate nutrition will improve Outcome: Progressing Goal: Progress toward achieving an optimal weight will improve Outcome: Progressing   Problem: Skin Integrity: Goal: Risk for impaired skin integrity will decrease Outcome: Progressing   Problem: Tissue Perfusion: Goal: Adequacy of tissue perfusion will improve Outcome: Progressing   Problem: Fluid Volume: Goal: Hemodynamic stability will improve Outcome: Progressing   Problem: Clinical Measurements: Goal: Signs and symptoms of infection will decrease Outcome: Progressing   Problem: Clinical Measurements: Goal: Ability to avoid or minimize complications of infection will improve Outcome: Progressing   Problem: Skin Integrity: Goal: Skin integrity will improve Outcome: Progressing   Problem: Education: Goal: Understanding of CV disease, CV risk reduction, and recovery process will improve Outcome: Progressing Goal: Individualized Educational Video(s) Outcome: Progressing   Problem: Activity: Goal: Ability to return to baseline activity level will improve Outcome: Progressing   Problem: Cardiovascular: Goal: Ability to achieve and maintain adequate cardiovascular perfusion will improve Outcome: Progressing Goal: Vascular access site(s) Level 0-1 will be maintained Outcome: Progressing   Problem: Health Behavior/Discharge Planning: Goal: Ability to safely manage health-related needs after discharge will improve Outcome: Progressing    Problem: Activity: Goal: Ability to tolerate increased activity will improve Outcome: Progressing   Problem: Respiratory: Goal: Ability to maintain a clear airway and adequate ventilation will improve Outcome: Progressing   Problem: Role Relationship: Goal: Method of communication will improve Outcome: Progressing   Problem: Cardiac: Goal: Ability to achieve and maintain adequate cardiopulmonary perfusion will improve Outcome: Progressing Goal: Vascular access site(s) Level 0-1 will be maintained Outcome: Progressing   Problem: Fluid Volume: Goal: Ability to achieve a balanced intake and output will improve Outcome: Progressing  Problem: Physical Regulation: Goal: Complications related to the disease process, condition or treatment will be avoided or minimized Outcome: Progressing

## 2023-08-25 NOTE — Progress Notes (Signed)
 Calorie Count Note  48 hour calorie count ordered.  Diet: regular Supplements: Ensure BID, Magic cup BID  Estimated Nutritional Needs:  Kcal:  1700-1900 kcals Protein:  120-140 gm Fluid:  1.7 L  Unfortunately, no calorie count information available very flowsheet documentation or meal tickets.   Pt sitting up in bedside recliner at time of visit. She states that her intake has improved somewhat. She has been eating from all 3 meal trays but has not been eating >50% of meals. She states that she is trying to drink an Ensure when she is not eating enough though recalls only consuming 1 Ensure daily throughout the weekend. She denies having received food from outside hospital this weekend. Her son is going to bring some food for her today.   Given inability to adequately assess pt's nutritional intake, will continue calorie count x 24 hours to assess how well pt is meeting her nutrition needs at this time.   Pt reports that her stools are much improved. Will discontinue Banatrol for now.   Nutrition Dx: Inadequate oral intake related to acute illness as evidenced by NPO status.   Goal: Patient will meet greater than or equal to 90% of their needs   Intervention:  Continue calorie count x24 hours Regular diet Ensure Enlive po BID, each supplement provides 350 kcal and 20 grams of protein. Magic cup BID with meals, each supplement provides 290 kcal and 9 grams of protein  Allie Elman Dettman, RDN, LDN Clinical Nutrition See AMiON for contact information.

## 2023-08-25 NOTE — Progress Notes (Signed)
 Patient ID: [name], female   DOB: Jul 30, 1960, 63 y.o.   MRN: [id number]                                                                                               Advanced Heart Failure Rounding Note   Chief Complaint: CHF  Subjective:    63 y/o obese woman admitted with cardiogenic shock in setting of severe MR due to endocarditis and perforation of anterior MV leaflet. Underwent On-X mechanical MVR on 08/14/23. Post-op course c/b RV Failure  POD #12 s/p MVR  Creatinine 1.15 but K up to 5.4 today. CVP 6.    DCCV to NSR 5/23, she remains in NSR now on po amiodarone .  INR 4.5 on warfarin, off heparin  gtt.     Echo (5/24): EF 55-60%, mildly D-shaped ventricle with mild RV dysfunction and mild RV enlargement, IVC dilated, On-X mechanical MV functioning normally.    Objective:    Weight Range: 114.7 kg Body mass index is 42.07 kg/m.   Vital Signs:   Temp:  [97.8 F (36.6 C)-99 F (37.2 C)] 97.8 F (36.6 C) (05/26 0817) Pulse Rate:  [79-90] 90 (05/26 0817) Resp:  [16-19] 16 (05/26 0817) BP: (102-146)/(65-79) 139/65 (05/26 0817) SpO2:  [98 %-100 %] 100 % (05/26 0817) Weight:  [114.7 kg] 114.7 kg (05/26 0330) Last BM Date : 08/23/23  Weight change: Filed Weights   08/23/23 0500 08/24/23 0506 08/25/23 0330  Weight: 114.6 kg 117.3 kg 114.7 kg   Intake/Output:  Intake/Output Summary (Last 24 hours) at 08/25/2023 0827 Last data filed at 08/25/2023 0600 Gross per 24 hour  Intake 1816.49 ml  Output 475 ml  Net 1341.49 ml    Physical Exam   General: NAD Neck: No JVD, no thyromegaly or thyroid  nodule.  Lungs: Clear to auscultation bilaterally with normal respiratory effort. CV: Nondisplaced PMI.  Heart regular S1/S2 with mechanical S1, no S3/S4, no murmur.  No peripheral edema.    Abdomen: Soft, nontender, no hepatosplenomegaly, no distention.  Skin: Intact without lesions or rashes.  Neurologic: Alert and oriented x 3.  Psych: Normal affect. Extremities: No  clubbing or cyanosis.  HEENT: Normal.   Telemetry   NSR 80s personally reviewed.  Labs    CBC Recent Labs    08/24/23 0515 08/25/23 0445  WBC 5.9 7.0  HGB 10.0* 9.6*  HCT 31.6* 30.6*  MCV 88.5 90.3  PLT 463* 469*   Basic Metabolic Panel Recent Labs    40/98/11 0515 08/25/23 0445  NA 137 140  K 3.4* 5.4*  CL 102 105  CO2 25 24  GLUCOSE 145* 135*  BUN 22 20  CREATININE 1.08* 1.15*  CALCIUM  9.2 9.1  MG 2.3 2.0   Imaging   No results found.     Medications:    Scheduled Medications:  sodium chloride    Intravenous Once   amiodarone   400 mg Oral BID   anastrozole   1 mg Oral Daily   aspirin   81 mg Oral Daily   Chlorhexidine  Gluconate Cloth  6 each Topical Daily   [place], Patient & Family Education  Does not apply Once   feeding supplement  237 mL Oral BID BM   fiber supplement (BANATROL TF)  60 mL Oral QID   insulin  aspart  0-24 Units Subcutaneous Q4H   insulin  aspart  5 Units Subcutaneous TID WC   insulin  glargine-yfgn  30 Units Subcutaneous BID   pantoprazole   40 mg Oral Daily   sodium chloride  flush  10-40 mL Intracatheter Q12H   sodium chloride  flush  3 mL Intravenous Q12H   [START ON 08/26/2023] spironolactone   25 mg Oral Daily   torsemide  40 mg Oral BID   Warfarin - Pharmacist Dosing Inpatient   Does not apply q1600   Infusions:  penicillin  G potassium 12 Million Units in sodium chloride  0.9 % 500 mL CONTINUOUS infusion 41.7 mL/hr at 08/25/23 0600   PRN Medications: clonazePAM , dextrose , Gerhardt's butt cream, melatonin, morphine  injection, ondansetron  (ZOFRAN ) IV, mouth rinse, oxyCODONE , prochlorperazine , senna-docusate, sodium chloride  flush, sodium chloride  flush, traMADol   Assessment/Plan   1. Cardiogenic shock: Valvular shock from severe MR.  TEE on 5/5 showed LV EF 65%, normal RV function, severe MR with perforated leaflet and mobile vegetation.  Patient developed flash pulmonary edema 5/9 in setting of marked  hypertension -> IABP placed. - s/p mMVR 08/14/23 - Echo 5/24: EF 55-60%, mildly D-shaped ventricle with mild RV dysfunction and mild RV enlargement, IVC dilated, On-X mechanical MV functioning normally.  - CVP 6 today, can decrease torsemide to once daily.  - Milrinone  stopped 5/22, stable.   - K up today, stop K supplement and hold spironolactone  today.  Repeat BMET in pm and restart spironolactone  tomorrow.  - Eventual SGLT2 inhibitor.   2. Mitral regurgitation: Severe on TEE with perforated anterior leaflet and mitral valve vegetation.  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  - s/p On-X mechanical MVR with Dr. Luna Salinas 08/14/23 - post-op antibiotics per ID. On PenG, will require 6 weeks IV abx (start date 05/14). - Now on warfarin, goal INR 2.5 -3.5. Also on ASA 81. INR elevated today, will need to hold warfarin.   3. Acute hypercarbic/hypoxemic respiratory failure in setting of #1&2 - Resolved - Encouraged IS   4. Atrial flutter with RVR - S/p DCCV 5/23. Maintaining NSR  - transitioned to po amiodarone .  - Continue warfarin as above (need to hold today with INR 4.5).   5. Morbid obesity - Body mass index is 42.07 kg/m.  6. Hypervolemic Hyponatremia - resolved w/ diuresis.    7. Anemia - hgb 9.6 today.  - labs c/w IDA but given acute infection will hold off on IV Fe   Mobilize.   Length of Stay: 37  Peder Bourdon, MD  08/25/2023, 8:27 AM  Advanced Heart Failure Team Pager [phone] (M-F; 7a - 5p)  Please contact [facility] for night-coverage after hours (5p -7a ) and weekends on [email or web address]

## 2023-08-25 NOTE — Progress Notes (Signed)
 Occupational Therapy Treatment Patient Details Name: Melissa Bray MRN: [id number] DOB: 1960-06-04 Today's Date: 08/25/2023   History of present illness 63 y.o. female presents [facility] 07/31/23 from [place]. Originally diagnosed with sepsis 2/2 LUE cellulitis with transfer to [facility] after murmur found. Pt with severe mitral regurgitation 2/2 endocarditis. 5/5 TEE. 5/8 extraction of teeth. 5/9 quick deterioration 2/2 flash pulmonary edema and cardiogenic shock before LHC was performed. 5/9-5/12 intubated. 5/9-5/15 IABP. 5/13 MVR. 5/23 cardioversion and TEE. PMHx: T2DM, HTN, obesity, GAD, sinus tachycardia   OT comments  Patient making good progress with OT treatment. Patient aware of sternal precautions and utilizes heart pillow for sit to stands. Patient able to perform transfer to [facility] and toilet hygiene with min assist. Patient performed self care tasks at sink, seated more than standing due to fatigue. Patient will benefit from intensive inpatient follow-up therapy, >3 hours/day. Acute OT to continue to follow to address established goals to facilitate DC to next venue of care.        If plan is discharge home, recommend the following:  A lot of help with bathing/dressing/bathroom;A lot of help with walking and/or transfers   Equipment Recommendations  Tub/shower seat;BSC/3in1    Recommendations for Other Services      Precautions / Restrictions Precautions Precautions: Sternal;Fall Precaution Booklet Issued: Yes (comment) Recall of Precautions/Restrictions: Impaired Precaution/Restrictions Comments: good recall of precautions, uses heart pillow consistantly for sit to stands Restrictions Weight Bearing Restrictions Per Provider Order: Yes (sternal precautions) RUE Weight Bearing Per Provider Order: Non weight bearing LUE Weight Bearing Per Provider Order: Non weight bearing RLE Weight Bearing Per Provider Order: Weight bearing as tolerated LLE Weight Bearing Per Provider Order: Weight  bearing as tolerated Other Position/Activity Restrictions: sternal       Mobility Bed Mobility Overal bed mobility: Needs Assistance Bed Mobility: Rolling, Sidelying to Sit Rolling: Contact guard assist Sidelying to sit: Min assist       General bed mobility comments: patient able to roll with min assist with trunk to get to EOB    Transfers Overall transfer level: Needs assistance Equipment used: Rolling walker (2 wheels) Transfers: Sit to/from Stand, Bed to chair/wheelchair/BSC Sit to Stand: Min assist           General transfer comment: min assist and momentum to stand from EOB, BSC and chair with patient using heart pillow to assist with maintaining sternal precautions     Balance Overall balance assessment: Needs assistance Sitting-balance support: No upper extremity supported, Feet supported Sitting balance-Leahy Scale: Good     Standing balance support: No upper extremity supported Standing balance-Leahy Scale: Fair Standing balance comment: able to stand without UE support during self care tasks but quickly fatigues                           ADL either performed or assessed with clinical judgement   ADL Overall ADL's : Needs assistance/impaired     Grooming: Wash/dry hands;Wash/dry face;Oral care;Set up;Sitting Grooming Details (indicate cue type and reason): at sink Upper Body Bathing: Set up;Sitting Upper Body Bathing Details (indicate cue type and reason): at sink             Toilet Transfer: Minimal assistance;BSC/3in1 Toilet Transfer Details (indicate cue type and reason): able to transfer from EOB to [facility] with min assist Toileting- Clothing Manipulation and Hygiene: Sit to/from stand;Minimal assistance Toileting - Clothing Manipulation Details (indicate cue type and reason): able to perform toilet  hygiene while standing with min assist for clothing management     Functional mobility during ADLs: Minimal assistance;Rolling walker (2  wheels) General ADL Comments: uses momentum and min assist for sit to stand and min assist for stability for transfers    Extremity/Trunk Assessment Upper Extremity Assessment Upper Extremity Assessment: Overall WFL for tasks assessed            Vision       Perception     Praxis     Communication Communication Communication: Impaired Factors Affecting Communication: Reduced clarity of speech (soft spoken)   Cognition Arousal: Alert Behavior During Therapy: WFL for tasks assessed/performed Cognition: No apparent impairments         Attention impairment (select first level of impairment): Sustained attention                     Following commands: Intact Following commands impaired: Only follows one step commands consistently      Cueing   Cueing Techniques: Verbal cues, Tactile cues  Exercises      Shoulder Instructions       General Comments VSS on RA    Pertinent Vitals/ Pain       Pain Assessment Pain Assessment: Faces Faces Pain Scale: Hurts a little bit Pain Location: chest Pain Descriptors / Indicators: Discomfort, Grimacing Pain Intervention(s): Monitored during session  Home Living                                          Prior Functioning/Environment              Frequency  Min 2X/week        Progress Toward Goals  OT Goals(current goals can now be found in the care plan section)  Progress towards OT goals: Progressing toward goals  Acute Rehab OT Goals Patient Stated Goal: get stronger OT Goal Formulation: With patient Time For Goal Achievement: 09/01/23 Potential to Achieve Goals: Good ADL Goals Pt Will Perform Grooming: with supervision;standing Pt Will Perform Upper Body Dressing: with supervision;sitting;standing Pt Will Perform Lower Body Dressing: with supervision;sit to/from stand Pt Will Transfer to Toilet: with supervision;ambulating;regular height toilet Pt/caregiver will Perform Home  Exercise Program: Increased ROM;Increased strength;Both right and left upper extremity;With Supervision  Plan      Co-evaluation                 AM-PAC OT "6 Clicks" Daily Activity     Outcome Measure   Help from another person eating meals?: A Little Help from another person taking care of personal grooming?: A Little Help from another person toileting, which includes using toliet, bedpan, or urinal?: A Little Help from another person bathing (including washing, rinsing, drying)?: A Lot Help from another person to put on and taking off regular upper body clothing?: A Lot Help from another person to put on and taking off regular lower body clothing?: A Lot 6 Click Score: 15    End of Session Equipment Utilized During Treatment: Rolling walker (2 wheels)  OT Visit Diagnosis: Unsteadiness on feet (R26.81);Muscle weakness (generalized) (M62.81)   Activity Tolerance Patient tolerated treatment well   Patient Left in chair;with call bell/phone within reach   Nurse Communication Mobility status        Time: 6045-4098 OT Time Calculation (min): 25 min  Charges: OT General Charges $OT Visit: 1 Visit OT Treatments $Self  Care/Home Management : 23-37 mins  Anitra Barn, OTA Acute Rehabilitation Services  Office [phone]   Jovita Nipper 08/25/2023, 12:21 PM

## 2023-08-25 NOTE — Progress Notes (Addendum)
 [address]       [name] [address]             [phone]      3 Days Post-Op Procedure(s) (LRB): TRANSESOPHAGEAL ECHOCARDIOGRAM (N/A) CARDIOVERSION (N/A) Subjective: Patient states she ate some cereal and a banana this AM and is going to drink an Ensure shake.   Objective: Vital signs in last 24 hours: Temp:  [97.8 F (36.6 C)-99 F (37.2 C)] 97.8 F (36.6 C) (05/26 0817) Pulse Rate:  [79-90] 90 (05/26 0817) Cardiac Rhythm: Normal sinus rhythm (05/26 0700) Resp:  [16-19] 16 (05/26 0817) BP: (102-146)/(65-79) 139/65 (05/26 0817) SpO2:  [98 %-100 %] 100 % (05/26 0817) Weight:  [114.7 kg] 114.7 kg (05/26 0330)  Hemodynamic parameters for last 24 hours: CVP:  [7 mmHg-13 mmHg] 11 mmHg  Intake/Output from previous day: 05/25 0701 - 05/26 0700 In: 1816.5 [I.V.:40.1; IV Piggyback:1776.4] Out: 475 [Urine:475] Intake/Output this shift: No intake/output data recorded.  General appearance: alert, cooperative, and no distress Neurologic: intact Heart: regular rate and rhythm, S1, S2 normal, no murmur, click, rub or gallop Lungs: slightly diminished breath sounds bibasilar  Abdomen: soft, non-tender; bowel sounds normal; no masses,  no organomegaly Extremities: edema trace Wound: Clean and dry dressings in place   Lab Results: Recent Labs    08/24/23 0515 08/25/23 0445  WBC 5.9 7.0  HGB 10.0* 9.6*  HCT 31.6* 30.6*  PLT 463* 469*   BMET:  Recent Labs    08/24/23 0515 08/25/23 0445  NA 137 140  K 3.4* 5.4*  CL 102 105  CO2 25 24  GLUCOSE 145* 135*  BUN 22 20  CREATININE 1.08* 1.15*  CALCIUM  9.2 9.1    PT/INR:  Recent Labs    08/25/23 0445  LABPROT 42.9*  INR 4.5*   ABG    Component Value Date/Time   PHART 7.465 (H) 08/14/2023 1700   HCO3 25.0 08/14/2023 1700   TCO2 26 08/14/2023 1700   ACIDBASEDEF 1.0 08/08/2023 1634   O2SAT 63.8 08/25/2023 0450   CBG (last 3)  Recent Labs    08/24/23 2000 08/24/23 2351  08/25/23 0343  GLUCAP 108* 82 95    Assessment/Plan: S/P Procedure(s) (LRB): TRANSESOPHAGEAL ECHOCARDIOGRAM (N/A) CARDIOVERSION (N/A)   CV: Postop PAF with RVR with successful TEE/DC-CV. NSR 1st degree HB, HR 80s this AM. SBP 100-130s. On Spironolactone  25mg  daily. Digoxin has been d/c'd. Coox 63.8 this AM. AHF team is following, appreciate their assistance.   Pulm: Saturating 100% on RA. Last CXR with bibasilar atelectasis. Encourage IS and ambulation   GI: +BM, Cortrak has been removed. Tolerating diet. No nausea, vomiting. Patient to continue to increase diet as tolerated.    Endo: CBGs controlled on Semglee  30U BID, Novolog  5U with meals and SSI PRN.   Renal: Cr 1.15, slightly increased from 1.08 yesterday. On Torsemide 40mg  daily. UO 475cc/24hrs recorded. -6lbs from yesterday. Hyperkalemia, K 5.4. K supplement has been d/c'd and AHF team is holding Spironolactone  today. Hyponatremia resolved.   ID: Endocarditis, on Penicillin  G. ID recommending IB PCN G for 6 weeks starting from 05/14, end date of 06/25. They have signed off but will need to call at discharge to arrange outpatient follow up. Tmax 99, no leukocytosis.    Expected postop ABLA: H/H 9.6/30.6, stable. Received 1U PRBCs 05/22. Not clinically significant at this time.    DVT Prophylaxis: On Coumadin    Deconditioning: Continue work with PT/OT. Plan for CIR at discharge.  Anticoagulation: On Coumadin , INR jumped from 2.7 to 4.5. IV heparin  had been discontinued. Pharmacy managing coumadin , plan to hold coumadin  tonight.    Dispo: Hopefully can d/c to CIR this week   LOS: 23 days    Randa Burton, PA-C 08/25/2023  Agree Doing better Continue PT/OR Holding coumadin   Prosperity Darrough O Baird Polinski

## 2023-08-25 NOTE — Progress Notes (Signed)
 Patient had BM with small streaks of blood, pt stated she has been constipated and straining. RPH Abelina Abide notified, DR. Mitzie Anda aware, Helmut Lobe notified. Plan of care continues.

## 2023-08-25 NOTE — Progress Notes (Addendum)
 PHARMACY - ANTICOAGULATION CONSULT NOTE  Pharmacy Consult for warfarin Indication: onX mMVR  No Known Allergies  Patient Measurements: Height: 5\' 5"  (165.1 cm) Weight: 114.7 kg (252 lb 12.8 oz) IBW/kg (Calculated) : 57 HEPARIN  DW (KG): 84.3  Vital Signs: Temp: 98.6 F (37 C) (05/26 0338) Temp Source: Oral (05/26 0338) BP: 130/65 (05/26 0400) Pulse Rate: 82 (05/26 0600)  Labs: Recent Labs    08/23/23 0429 08/23/23 0910 08/24/23 0515 08/25/23 0445  HGB 8.9*  --  10.0* 9.6*  HCT 27.2*  --  31.6* 30.6*  PLT 414*  --  463* 469*  LABPROT 25.2* 23.4* 28.6* 42.9*  INR 2.3* 2.1* 2.7* 4.5*  HEPARINUNFRC >1.10* 0.36  --   --   CREATININE 1.22*  --  1.08* 1.15*    Estimated Creatinine Clearance: 62.5 mL/min (A) (by C-G formula based on SCr of 1.15 mg/dL (H)).   Medical History: Past Medical History:  Diagnosis Date   Anxiety    Breast cancer (HCC)    Diabetes mellitus without complication (HCC)    Hyperlipidemia    Hypertension    Type 2 diabetes mellitus without retinopathy (HCC) 06/17/2017    Medications:  Scheduled:   sodium chloride    Intravenous Once   amiodarone   400 mg Oral BID   anastrozole   1 mg Oral Daily   aspirin   81 mg Oral Daily   Chlorhexidine  Gluconate Cloth  6 each Topical Daily   [place] [facility], Patient & Family Education   Does not apply Once   feeding supplement  237 mL Oral BID BM   fiber supplement (BANATROL TF)  60 mL Oral QID   insulin  aspart  0-24 Units Subcutaneous Q4H   insulin  aspart  5 Units Subcutaneous TID WC   insulin  glargine-yfgn  30 Units Subcutaneous BID   pantoprazole   40 mg Oral Daily   potassium chloride   30 mEq Oral BID   sodium chloride  flush  10-40 mL Intracatheter Q12H   sodium chloride  flush  3 mL Intravenous Q12H   spironolactone   25 mg Oral Daily   torsemide  40 mg Oral BID   Warfarin - Pharmacist Dosing Inpatient   Does not apply q1600    Assessment: 26 yof presenting with acute mitral valve  endocarditis with perforated mitral leaflet and severe MR. Underwent dental extraction 5/8. Presented for pre-op RHC and developed flash pulmonary edema requiring intubation - IABP was placed in lab and IV heparin  started. No AC PTA.  Pt now s/p cardiac surgery with onX mMVR placed. Pharmacy consulted to start warfarin 5/16 after IABP removal 5/15. S/p DCCV 5/23 and maintaining NSR. Heparin  bridge dc'd once INR > 2.  INR jumped from 2.7 to 4.5, which is supratherapeutic.CBC stable. No s/sx of bleeding per RN. Given large jump in INR to supratherapeutic range, will hold warfarin tonight.  Goal of Therapy:  INR 2.5-3.5 Monitor platelets by anticoagulation protocol: Yes   Plan:  Hold warfarin tonight Daily INR and CBC  Abelina Abide, PharmD PGY1 Pharmacy Resident 08/25/2023 8:04 AM  ADDENDUM: RN noted small streaks of blood in bowel movement but patient stated she was constipated and had been straining. HF and TCTS made aware. No intervention needed at this time but will continue to monitor.  Abelina Abide, PharmD PGY1 Pharmacy Resident 08/25/2023 11:14 AM

## 2023-08-25 NOTE — Progress Notes (Signed)
 Physical Therapy Treatment Patient Details Name: Melissa Bray MRN: [id number] DOB: 08-11-1960 Today's Date: 08/25/2023   History of Present Illness 63 y.o. female presents to [facility] 07/31/23 from [place]. Originally diagnosed with sepsis 2/2 LUE cellulitis with transfer to [facility] after murmur found. Pt with severe mitral regurgitation 2/2 endocarditis. 5/5 TEE. 5/8 extraction of teeth. 5/9 quick deterioration 2/2 flash pulmonary edema and cardiogenic shock before LHC was performed. 5/9-5/12 intubated. 5/9-5/15 IABP. 5/13 MVR. 5/23 cardioversion and TEE. PMHx: T2DM, HTN, obesity, GAD, sinus tachycardia    PT Comments  Pt up in chair on arrival and agreeable to session, despite reporting increased fatigue this date from being up in chair for most of day, and demonstrating good progress towards acute goals. Pt with good recall for all precautions throughout mobility with light cues for adherence during bed mobility. Pt able to rise to stand from low chair with min A to facilitate anterior weight shift and boost. Pt demonstrating increased activity tolerance, progressing gait distance without need for seated recovery with grossly CGA for safety and RW for support. Pt continues to be limited in safe mobility by decreased activity tolerance, weakness and impaired balance/postural reactions. Pt continues to benefit from skilled PT services to progress toward functional mobility goals.      If plan is discharge home, recommend the following: A lot of help with walking and/or transfers;A lot of help with bathing/dressing/bathroom;Assistance with cooking/housework;Assist for transportation;Help with stairs or ramp for entrance   Can travel by private vehicle        Equipment Recommendations  Rolling walker (2 wheels);BSC/3in1    Recommendations for Other Services       Precautions / Restrictions Precautions Precautions: Sternal;Fall Precaution Booklet Issued: Yes (comment) Recall of  Precautions/Restrictions: Impaired Precaution/Restrictions Comments: good recall of precautions Restrictions Weight Bearing Restrictions Per Provider Order: Yes (sternal precautions) Other Position/Activity Restrictions: sternal     Mobility  Bed Mobility Overal bed mobility: Needs Assistance Bed Mobility: Rolling, Sit to Sidelying Rolling: Contact guard assist       Sit to sidelying: Min assist General bed mobility comments: min A to manage LEs into bed, cues for log roll technique    Transfers Overall transfer level: Needs assistance Equipment used: Rolling walker (2 wheels) Transfers: Sit to/from Stand, Bed to chair/wheelchair/BSC Sit to Stand: Min assist           General transfer comment: min A to facilitate anterior weight shift, good recall for precautions on transfer    Ambulation/Gait Ambulation/Gait assistance: Contact guard assist Gait Distance (Feet): 125 Feet Assistive device: Rolling walker (2 wheels) Gait Pattern/deviations: Step-through pattern, Decreased stride length Gait velocity: decr     General Gait Details: no overt LOB noted, slow steady pace, CGA for safety   Stairs             Wheelchair Mobility     Tilt Bed    Modified Rankin (Stroke Patients Only)       Balance Overall balance assessment: Needs assistance Sitting-balance support: No upper extremity supported, Feet supported Sitting balance-Leahy Scale: Good     Standing balance support: No upper extremity supported Standing balance-Leahy Scale: Fair Standing balance comment: able to stand without UE support during self care tasks but quickly fatigues                            Communication Communication Communication: Impaired Factors Affecting Communication: Reduced clarity of speech (soft spoken)  Cognition  Arousal: Alert Behavior During Therapy: WFL for tasks assessed/performed                             Following commands:  Intact Following commands impaired: Only follows one step commands consistently    Cueing Cueing Techniques: Verbal cues, Tactile cues  Exercises      General Comments General comments (skin integrity, edema, etc.): VSS on RA      Pertinent Vitals/Pain Pain Assessment Pain Assessment: Faces Faces Pain Scale: Hurts a little bit Pain Location: chest Pain Descriptors / Indicators: Discomfort, Grimacing Pain Intervention(s): Monitored during session, Limited activity within patient's tolerance    Home Living                          Prior Function            PT Goals (current goals can now be found in the care plan section) Acute Rehab PT Goals PT Goal Formulation: With patient Time For Goal Achievement: 08/31/23 Progress towards PT goals: Progressing toward goals    Frequency    Min 3X/week      PT Plan      Co-evaluation              AM-PAC PT "6 Clicks" Mobility   Outcome Measure  Help needed turning from your back to your side while in a flat bed without using bedrails?: A Lot Help needed moving from lying on your back to sitting on the side of a flat bed without using bedrails?: A Lot Help needed moving to and from a bed to a chair (including a wheelchair)?: A Little Help needed standing up from a chair using your arms (e.g., wheelchair or bedside chair)?: A Little Help needed to walk in hospital room?: A Little Help needed climbing 3-5 steps with a railing? : Total 6 Click Score: 14    End of Session   Activity Tolerance: Patient tolerated treatment well Patient left: in bed;with call bell/phone within reach Nurse Communication: Mobility status PT Visit Diagnosis: Unsteadiness on feet (R26.81);Other abnormalities of gait and mobility (R26.89);Muscle weakness (generalized) (M62.81)     Time: 1610-9604 PT Time Calculation (min) (ACUTE ONLY): 17 min  Charges:    $Gait Training: 8-22 mins PT General Charges $$ ACUTE PT VISIT: 1  Visit                     Wilburt Messina R. PTA Acute Rehabilitation Services Office: [phone]   Agapito Horseman 08/25/2023, 4:15 PM

## 2023-08-26 DIAGNOSIS — I340 Nonrheumatic mitral (valve) insufficiency: Secondary | ICD-10-CM | POA: Diagnosis not present

## 2023-08-26 DIAGNOSIS — I059 Rheumatic mitral valve disease, unspecified: Secondary | ICD-10-CM | POA: Diagnosis not present

## 2023-08-26 DIAGNOSIS — R5381 Other malaise: Secondary | ICD-10-CM

## 2023-08-26 LAB — GLUCOSE, CAPILLARY
Glucose-Capillary: 115 mg/dL — ABNORMAL HIGH (ref 70–99)
Glucose-Capillary: 139 mg/dL — ABNORMAL HIGH (ref 70–99)
Glucose-Capillary: 168 mg/dL — ABNORMAL HIGH (ref 70–99)
Glucose-Capillary: 194 mg/dL — ABNORMAL HIGH (ref 70–99)
Glucose-Capillary: 49 mg/dL — ABNORMAL LOW (ref 70–99)
Glucose-Capillary: 70 mg/dL (ref 70–99)
Glucose-Capillary: 76 mg/dL (ref 70–99)
Glucose-Capillary: 85 mg/dL (ref 70–99)

## 2023-08-26 LAB — HEPATIC FUNCTION PANEL
ALT: 14 U/L (ref 0–44)
AST: 21 U/L (ref 15–41)
Albumin: 3 g/dL — ABNORMAL LOW (ref 3.5–5.0)
Alkaline Phosphatase: 76 U/L (ref 38–126)
Bilirubin, Direct: <0.1 mg/dL (ref 0.0–0.2)
Total Bilirubin: 0.6 mg/dL (ref 0.0–1.2)
Total Protein: 8.1 g/dL (ref 6.5–8.1)

## 2023-08-26 LAB — CBC
HCT: 28.6 % — ABNORMAL LOW (ref 36.0–46.0)
Hemoglobin: 9.2 g/dL — ABNORMAL LOW (ref 12.0–15.0)
MCH: 28.7 pg (ref 26.0–34.0)
MCHC: 32.2 g/dL (ref 30.0–36.0)
MCV: 89.1 fL (ref 80.0–100.0)
Platelets: 429 10*3/uL — ABNORMAL HIGH (ref 150–400)
RBC: 3.21 MIL/uL — ABNORMAL LOW (ref 3.87–5.11)
RDW: 14.6 % (ref 11.5–15.5)
WBC: 6.5 10*3/uL (ref 4.0–10.5)
nRBC: 0 % (ref 0.0–0.2)

## 2023-08-26 LAB — PROTIME-INR
INR: 5.1 (ref 0.8–1.2)
Prothrombin Time: 47.3 s — ABNORMAL HIGH (ref 11.4–15.2)

## 2023-08-26 LAB — COOXEMETRY PANEL
Carboxyhemoglobin: 1.8 % — ABNORMAL HIGH (ref 0.5–1.5)
Methemoglobin: 0.7 % (ref 0.0–1.5)
O2 Saturation: 72.5 %
Total hemoglobin: 9.4 g/dL — ABNORMAL LOW (ref 12.0–16.0)

## 2023-08-26 LAB — BASIC METABOLIC PANEL WITH GFR
Anion gap: 6 (ref 5–15)
Anion gap: 9 (ref 5–15)
BUN: 15 mg/dL (ref 8–23)
BUN: 16 mg/dL (ref 8–23)
CO2: 24 mmol/L (ref 22–32)
CO2: 25 mmol/L (ref 22–32)
Calcium: 8.4 mg/dL — ABNORMAL LOW (ref 8.9–10.3)
Calcium: 9.4 mg/dL (ref 8.9–10.3)
Chloride: 108 mmol/L (ref 98–111)
Chloride: 104 mmol/L (ref 98–111)
Creatinine, Ser: 1.04 mg/dL — ABNORMAL HIGH (ref 0.44–1.00)
Creatinine, Ser: 1.07 mg/dL — ABNORMAL HIGH (ref 0.44–1.00)
GFR, Estimated: >60 mL/min (ref >60)
GFR, Estimated: 58 mL/min — ABNORMAL LOW (ref >60)
Glucose, Bld: 90 mg/dL (ref 70–99)
Glucose, Bld: 232 mg/dL — ABNORMAL HIGH (ref 70–99)
Potassium: 5.6 mmol/L — ABNORMAL HIGH (ref 3.5–5.1)
Potassium: 4.5 mmol/L (ref 3.5–5.1)
Sodium: 138 mmol/L (ref 135–145)
Sodium: 138 mmol/L (ref 135–145)

## 2023-08-26 LAB — MAGNESIUM: Magnesium: 2.1 mg/dL (ref 1.7–2.4)

## 2023-08-26 MED ORDER — CARVEDILOL 3.125 MG PO TABS
3.1250 mg | ORAL_TABLET | Freq: Two times a day (BID) | ORAL | Status: DC
  Administered 2023-08-26 – 2023-08-29 (×7): 3.125 mg via ORAL
  Filled 2023-08-26 (×7): qty 1

## 2023-08-26 MED ORDER — INSULIN ASPART 100 UNIT/ML IJ SOLN
0.0000 [IU] | Freq: Three times a day (TID) | INTRAMUSCULAR | Status: DC
  Administered 2023-08-26: 2 [IU] via SUBCUTANEOUS
  Administered 2023-08-26: 3 [IU] via SUBCUTANEOUS
  Administered 2023-08-27: 5 [IU] via SUBCUTANEOUS

## 2023-08-26 MED ORDER — TORSEMIDE 20 MG PO TABS
20.0000 mg | ORAL_TABLET | Freq: Every day | ORAL | Status: DC
  Administered 2023-08-26 – 2023-08-29 (×4): 20 mg via ORAL
  Filled 2023-08-26 (×3): qty 1

## 2023-08-26 MED ORDER — INSULIN ASPART 100 UNIT/ML IJ SOLN: 0.0000 [IU] | Freq: Every day | INTRAMUSCULAR | Status: DC

## 2023-08-26 MED ORDER — AMIODARONE HCL 200 MG PO TABS
200.0000 mg | ORAL_TABLET | Freq: Every day | ORAL | Status: DC
  Administered 2023-08-27 – 2023-08-29 (×3): 200 mg via ORAL
  Filled 2023-08-26 (×3): qty 1

## 2023-08-26 MED ORDER — DAPAGLIFLOZIN PROPANEDIOL 10 MG PO TABS
10.0000 mg | ORAL_TABLET | Freq: Every day | ORAL | Status: DC
  Administered 2023-08-26 – 2023-08-29 (×4): 10 mg via ORAL
  Filled 2023-08-26 (×4): qty 1

## 2023-08-26 NOTE — TOC Progression Note (Signed)
 Transition of Care [facility]) - Progression Note    Patient Details  Name: Melissa Bray MRN: [id number] Date of Birth: 1961/03/31  Transition of Care [facility]) CM/SW Contact  Ernst Heap Phone Number: [phone] 08/26/2023, 12:37 PM  Clinical Narrative:  HF CSW/NCM will continue to follow to monitor patients dc needs.   TOC will continue following.      Expected Discharge Plan: IP Rehab Facility Barriers to Discharge: Continued Medical Work up  Expected Discharge Plan and Services   Discharge Planning Services: CM Consult Post Acute Care Choice: IP Rehab Living arrangements for the past 2 months: Single Family Home                             HH Agency: [facility] Date HH Agency Contacted: 08/18/23 Time HH Agency Contacted: [phone] Representative spoke with at [facility] Agency: Kay Parson RN, [employer] rep   Social Determinants of Health (SDOH) Interventions SDOH Screenings   Food Insecurity: No Food Insecurity (07/31/2023)  Housing: Low Risk  (07/31/2023)  Transportation Needs: No Transportation Needs (07/31/2023)  Utilities: Not At Risk (07/31/2023)  Depression (PHQ2-9): Low Risk  (06/27/2023)  Tobacco Use: Low Risk  (08/22/2023)    Readmission Risk Interventions     No data to display

## 2023-08-26 NOTE — Progress Notes (Signed)
 Calorie Count Note  48 hour calorie count ordered.  Diet: Regular Supplements: Ensure BID, Magic Cup BID  Estimated Nutritional Needs:  Kcal:  1700-1900 kcal Protein:  120-140 gm Fluid:  1.7 L  Breakfast: 280 calories, 11 gm protein Lunch: 300 kcal, 40 gm protein Dinner: 0 calories, 0 gm protein Supplements: 845 calories, 34 gm protein  Total intake: 1425 kcal (84% of minimum estimated needs)  85 protein (71% of minimum estimated needs)  Nutrition Diagnosis: Inadequate oral intake related to acute illness as evidenced by NPO status.   Goal: Patient will meet greater than or equal to 90% of their needs   Intervention:  Regular diet Ensure Enlive po BID, each supplement provides 350 kcal and 20 grams of protein. Magic cup BID with meals, each supplement provides 290 kcal and 9 grams of protein   Doneta Furbish RD, LDN Clinical Dietitian

## 2023-08-26 NOTE — Progress Notes (Addendum)
 [address]       Melissa Bray [address]             [phone]      4 Days Post-Op Procedure(s) (LRB): TRANSESOPHAGEAL ECHOCARDIOGRAM (N/A) CARDIOVERSION (N/A) Subjective: Patient states she is a little tight around her sternal incision but otherwise feels good. Sitting at the edge of the bed eating brekafast this AM.   Objective: Vital signs in last 24 hours: Temp:  [97.8 F (36.6 C)-98.7 F (37.1 C)] 98.7 F (37.1 C) (05/27 0354) Pulse Rate:  [86-92] 86 (05/27 0354) Cardiac Rhythm: Heart block (05/26 1900) Resp:  [16-19] 18 (05/27 0354) BP: (117-145)/(60-95) 135/65 (05/27 0354) SpO2:  [100 %] 100 % (05/27 0354) Weight:  [114.6 kg] 114.6 kg (05/27 0645)  Hemodynamic parameters for last 24 hours: CVP:  [9 mmHg-15 mmHg] 15 mmHg  Intake/Output from previous day: 05/26 0701 - 05/27 0700 In: 775.3 [P.O.:400; IV Piggyback:375.3] Out: 500 [Urine:500] Intake/Output this shift: No intake/output data recorded.  General appearance: alert, cooperative, and no distress Neurologic: intact Heart: regular rate and rhythm, S1, S2 normal, no murmur, click, rub or gallop Lungs: diminished bibasilar breath sounds Abdomen: soft, non-tender; bowel sounds normal; no masses,  no organomegaly Extremities: edema 1+ BLE Wound: Clean and dry without sign of infection  Lab Results: Recent Labs    08/25/23 0445 08/26/23 0550  WBC 7.0 6.5  HGB 9.6* 9.2*  HCT 30.6* 28.6*  PLT 469* 429*   BMET:  Recent Labs    08/25/23 1622 08/26/23 0550  NA 138 138  K 5.8* 5.6*  CL 106 108  CO2 23 24  GLUCOSE 190* 90  BUN 19 15  CREATININE 1.21* 1.04*  CALCIUM  8.6* 8.4*    PT/INR:  Recent Labs    08/26/23 0550  LABPROT 47.3*  INR 5.1*   ABG    Component Value Date/Time   PHART 7.465 (H) 08/14/2023 1700   HCO3 25.0 08/14/2023 1700   TCO2 26 08/14/2023 1700   ACIDBASEDEF 1.0 08/08/2023 1634   O2SAT 72.5 08/26/2023 0554   CBG (last 3)  Recent Labs     08/25/23 2016 08/26/23 0015 08/26/23 0401  GLUCAP 81 70 85    Assessment/Plan: S/P Procedure(s) (LRB): TRANSESOPHAGEAL ECHOCARDIOGRAM (N/A) CARDIOVERSION (N/A)  CV: Postop PAF with RVR with successful TEE/DC-CV. NSR, HR 80s this AM. SBP 110s-130s. On PO Amiodarone  400mg  BID and Spironolactone  25mg  daily. Will start low dose Coreg. Coox 72.5 this AM. AHF team is following, appreciate their assistance.   Pulm: Saturating 100% on RA. Last CXR with bibasilar atelectasis. Encourage IS and ambulation   GI: +BM. Tolerating diet. No nausea, vomiting. Patient to continue to increase diet as tolerated.    Endo: CBGs controlled on Semglee  30U BID, Novolog  5U with meals and AC/HS SSI PRN.   Renal: Cr 1.04, stable. On Torsemide 40mg  daily per AHF. UO 500cc/24hrs recorded. Stable weight from yesterday. Hyperkalemia, K 5.6. K supplement has been d/c'd, plan to hold Spironolactone  today.   ID: Endocarditis, on Penicillin  G. ID recommending IB PCN G for 6 weeks starting from 05/14, end date of 06/25. They have signed off but will need to call at discharge to arrange outpatient follow up. Tmax 98.6, no leukocytosis.    Expected postop ABLA: H/H 9.2/28.6, stable. Received 1U PRBCs 05/22. Not clinically significant at this time.    DVT Prophylaxis: On Coumadin    Deconditioning: Continue work with PT/OT. Plan for CIR at discharge.   Anticoagulation:  On Coumadin , INR 2.7>4.5>5.1. Still elevated at 5.1 today after holding Coumadin  yesterday. Pharmacy managing coumadin , plan to hold coumadin  tonight. Small streaks of blood in BM yesterday while straining.    Dispo: Hopefully can d/c to CIR later this week   LOS: 24 days    Randa Burton, PA-C 08/26/2023 Patient seen and examined, agree with above Will hold coumadin  again today Stop aspirin  To CIR once INR stable  Landon Pinion C. Luna Salinas, MD [facility] [phone]

## 2023-08-26 NOTE — Progress Notes (Signed)
 Mobility Specialist Progress Note;   08/26/23 1102  Mobility  Activity Ambulated with assistance in hallway  Level of Assistance Contact guard assist, steadying assist  Assistive Device Front wheel walker  Distance Ambulated (ft) 150 ft  RUE Weight Bearing Per Provider Order NWB  LUE Weight Bearing Per Provider Order NWB  Activity Response Tolerated well  Mobility Referral Yes  Mobility visit 1 Mobility  Mobility Specialist Start Time (ACUTE ONLY) 1102  Mobility Specialist Stop Time (ACUTE ONLY) 1118  Mobility Specialist Time Calculation (min) (ACUTE ONLY) 16 min   Pt agreeable to mobility. Requested assistance to [facility], void successful. Required MinG assistance for STS, SV during ambulation in hallway. VSS throughout. Pt returned back to chair with all needs met, call bell in reach.   Janit Meline Mobility Specialist Please contact via SecureChat or [employer] [phone]

## 2023-08-26 NOTE — Progress Notes (Signed)
 Physical Therapy Treatment Patient Details Name: Melissa Bray MRN: [id number] DOB: 06/17/1960 Today's Date: 08/26/2023   History of Present Illness 63 y.o. female presents to [facility] 07/31/23 from [place]. Originally diagnosed with sepsis 2/2 LUE cellulitis with transfer to [facility] after murmur found. Pt with severe mitral regurgitation 2/2 endocarditis. 5/5 TEE. 5/8 extraction of teeth. 5/9 quick deterioration 2/2 flash pulmonary edema and cardiogenic shock before LHC was performed. 5/9-5/12 intubated. 5/9-5/15 IABP. 5/13 MVR. 5/23 cardioversion and TEE. PMHx: T2DM, HTN, obesity, GAD, sinus tachycardia    PT Comments  Pt required min assist bed mobility, min assist transfers, and CGA amb 160' with RW. Slow, steady gait. Good sitting balance and fair standing balance. SpO2 95% on RA. HR 94. Pt demo good recall of sternal precautions. Pt in recliner at end of session.     If plan is discharge home, recommend the following: A lot of help with bathing/dressing/bathroom;Assistance with cooking/housework;Assist for transportation;Help with stairs or ramp for entrance;A little help with walking and/or transfers   Can travel by private vehicle        Equipment Recommendations  Rolling walker (2 wheels);BSC/3in1    Recommendations for Other Services       Precautions / Restrictions Precautions Precautions: Sternal;Fall Recall of Precautions/Restrictions: Intact     Mobility  Bed Mobility Overal bed mobility: Needs Assistance Bed Mobility: Supine to Sit     Supine to sit: Min assist     General bed mobility comments: assist to elevate trunk and maintain sternal precautions    Transfers Overall transfer level: Needs assistance Equipment used: Rolling walker (2 wheels) Transfers: Sit to/from Stand Sit to Stand: Min assist           General transfer comment: Pt using momentum to power up, hugging heart pillow to maintain sternal precautions    Ambulation/Gait Ambulation/Gait  assistance: Contact guard assist Gait Distance (Feet): 160 Feet Assistive device: Rolling walker (2 wheels) Gait Pattern/deviations: Step-through pattern, Decreased stride length Gait velocity: decreased Gait velocity interpretation: <1.31 ft/sec, indicative of household ambulator   General Gait Details: slow, steady gait with RW   Stairs             Wheelchair Mobility     Tilt Bed    Modified Rankin (Stroke Patients Only)       Balance Overall balance assessment: Needs assistance Sitting-balance support: No upper extremity supported, Feet supported Sitting balance-Leahy Scale: Good     Standing balance support: No upper extremity supported, Bilateral upper extremity supported, During functional activity Standing balance-Leahy Scale: Fair Standing balance comment: static stand without UE support, RW for amb                            Communication Communication Communication: No apparent difficulties  Cognition Arousal: Alert Behavior During Therapy: WFL for tasks assessed/performed   PT - Cognitive impairments: No apparent impairments                         Following commands: Intact      Cueing Cueing Techniques: Verbal cues  Exercises      General Comments General comments (skin integrity, edema, etc.): VSS on RA      Pertinent Vitals/Pain Pain Assessment Pain Assessment: No/denies pain    Home Living  Prior Function            PT Goals (current goals can now be found in the care plan section) Acute Rehab PT Goals Patient Stated Goal: independence Progress towards PT goals: Progressing toward goals    Frequency    Min 3X/week      PT Plan      Co-evaluation              AM-PAC PT "6 Clicks" Mobility   Outcome Measure  Help needed turning from your back to your side while in a flat bed without using bedrails?: A Little Help needed moving from lying on your back  to sitting on the side of a flat bed without using bedrails?: A Lot Help needed moving to and from a bed to a chair (including a wheelchair)?: A Little Help needed standing up from a chair using your arms (e.g., wheelchair or bedside chair)?: A Little Help needed to walk in hospital room?: A Little Help needed climbing 3-5 steps with a railing? : Total 6 Click Score: 15    End of Session Equipment Utilized During Treatment: Gait belt Activity Tolerance: Patient tolerated treatment well Patient left: in chair;with call bell/phone within reach Nurse Communication: Mobility status PT Visit Diagnosis: Unsteadiness on feet (R26.81);Other abnormalities of gait and mobility (R26.89);Muscle weakness (generalized) (M62.81)     Time: 1610-9604 PT Time Calculation (min) (ACUTE ONLY): 26 min  Charges:    $Gait Training: 23-37 mins PT General Charges $$ ACUTE PT VISIT: 1 Visit                     Dorothye Gathers., PT  Office # [phone]    Guadelupe Leech 08/26/2023, 8:17 AM

## 2023-08-26 NOTE — Patient Instructions (Incomplete)
 Endocarditis is a potentially serious infection of heart valves or inside lining of the heart.  It occurs more commonly in patients with diseased heart valves (such as patient's with aortic or mitral valve disease) and in patients who have undergone heart valve repair or replacement.  Certain surgical and dental procedures may put you at risk, such as dental cleaning, other dental procedures, or any surgery involving the respiratory, urinary, gastrointestinal tract, gallbladder or prostate gland.   To minimize your chances for develooping endocarditis, maintain good oral health and seek prompt medical attention for any infections involving the mouth, teeth, gums, skin or urinary tract.    Always notify your doctor or dentist about your underlying heart valve condition before having any invasive procedures. You will need to take antibiotics before certain procedures, including all routine dental cleanings or other dental procedures.  Your cardiologist or dentist should prescribe these antibiotics for you to be taken ahead of time.   2. Make every effort to maintain a "heart-healthy" lifestyle with regular physical exercise and adherence to a low-fat, low-carbohydrate diet.  Continue to seek regular follow-up appointments with your primary care physician and/or cardiologist.  3. You are encouraged to enroll and participate in the outpatient cardiac rehab program beginning as soon as practical.  4.You may return to driving an automobile as long as you are no longer requiring oral narcotic pain relievers during the daytime.  It would be wise to start driving only short distances during the daylight and gradually increase from there as you feel comfortable.  5. You may continue to gradually increase your physical activity as tolerated.  Refrain from any heavy lifting or strenuous use of your arms and shoulders until at least 8 weeks from the time of your surgery, and avoid activities that cause increased  pain in your chest on the side of your surgical incision.  Otherwise you may continue to increase activities without any particular limitations.  Increase the intensity and duration of physical activity gradually.

## 2023-08-26 NOTE — Progress Notes (Signed)
 CARDIAC REHAB PHASE I    Pt sitting in chair, feeling well today. Ambulated once in hallway in hallway. Reports tolerating well, slowly improving. Encouraged IS, mobility and small frequent meals/snacks. Will continue to follow.   [id number] Ronny Colas, RN BSN 08/26/2023 10:38 AM

## 2023-08-26 NOTE — Progress Notes (Signed)
 PHARMACY - ANTICOAGULATION CONSULT NOTE  Pharmacy Consult for warfarin Indication: onX mMVR  No Known Allergies  Patient Measurements: Height: 5\' 5"  (165.1 cm) Weight: 114.6 kg (252 lb 10.4 oz) IBW/kg (Calculated) : 57 HEPARIN  DW (KG): 84.3  Vital Signs: Temp: 97.9 F (36.6 C) (05/27 0738) Temp Source: Oral (05/27 0738) BP: 114/81 (05/27 0738) Pulse Rate: 86 (05/27 0354)  Labs: Recent Labs    08/24/23 0515 08/25/23 0445 08/25/23 1622 08/26/23 0550  HGB 10.0* 9.6*  --  9.2*  HCT 31.6* 30.6*  --  28.6*  PLT 463* 469*  --  429*  LABPROT 28.6* 42.9*  --  47.3*  INR 2.7* 4.5*  --  5.1*  CREATININE 1.08* 1.15* 1.21* 1.04*    Estimated Creatinine Clearance: 69 mL/min (A) (by C-G formula based on SCr of 1.04 mg/dL (H)).   Medical History: Past Medical History:  Diagnosis Date   Anxiety    Breast cancer (HCC)    Diabetes mellitus without complication (HCC)    Hyperlipidemia    Hypertension    Type 2 diabetes mellitus without retinopathy (HCC) 06/17/2017    Medications:  Scheduled:   amiodarone   400 mg Oral BID   anastrozole   1 mg Oral Daily   carvedilol  3.125 mg Oral BID WC   Chlorhexidine  Gluconate Cloth  6 each Topical Daily   [facility], Patient & Family Education   Does not apply Once   dapagliflozin propanediol  10 mg Oral Daily   feeding supplement  237 mL Oral BID BM   fiber supplement (BANATROL TF)  60 mL Oral QID   insulin  aspart  0-15 Units Subcutaneous TID WC   insulin  aspart  0-5 Units Subcutaneous QHS   insulin  aspart  5 Units Subcutaneous TID WC   insulin  glargine-yfgn  30 Units Subcutaneous BID   pantoprazole   40 mg Oral Daily   sodium chloride  flush  10-40 mL Intracatheter Q12H   sodium chloride  flush  3 mL Intravenous Q12H   torsemide  20 mg Oral Daily   Warfarin - Pharmacist Dosing Inpatient   Does not apply q1600    Assessment: 89 yof presenting with acute mitral valve endocarditis with perforated mitral leaflet and  severe MR. Underwent dental extraction 5/8. Presented for pre-op RHC and developed flash pulmonary edema requiring intubation - IABP was placed in lab and IV heparin  started. No AC PTA.  Pt now s/p cardiac surgery with onX mMVR placed. Pharmacy consulted to start warfarin 5/16 after IABP removal 5/15. S/p DCCV 5/23 and maintaining NSR. Heparin  bridge dc'd once INR > 2.  INR jumped to 5.1 which is supratherapeutic.CBC stable. With small amount of rectal bleeding per RN - but pt was also constipated - monitor. Given large jump in INR to supratherapeutic range, will continue to hold warfarin tonight. Continue ensure bid   Goal of Therapy:  INR 2.5-3.5 Monitor platelets by anticoagulation protocol: Yes   Plan:  Hold warfarin tonight Daily INR and CBC  Hortensia Ma Pharm.D. CPP, BCPS Clinical Pharmacist [phone] 08/26/2023 9:49 AM  c  ADDENDUM: RN noted small streaks of blood in bowel movement but patient stated she was constipated and had been straining. HF and TCTS made aware. No intervention needed at this time but will continue to monitor.  Abelina Abide, PharmD PGY1 Pharmacy Resident 08/26/2023 9:44 AM

## 2023-08-26 NOTE — Consult Note (Signed)
 Physical Medicine and Rehabilitation Consult Reason for Consult:debility Referring Physician: Luna Salinas   HPI: Melissa Bray is a 63 y.o. female with a history of diabetes type 2, hypertension and breast cancer status post left mastectomy in 2016 who was originally admitted to [facility] on 07/31/2023 for suspected sepsis due to left upper extremity cellulitis.  She was found to have significant edema in the left upper extremity and was treated for sepsis/cellulitis when a murmur was noted.  She was transferred to [facility] and a TEE was performed on 5 5 which demonstrated severe mitral regurgitation due to endocarditis.  Patient had extraction of her teeth on 5 8.  The following day she deteriorated and went to cardiogenic shock.  She was intubated and intra-aortic balloon pump was placed.  Mitral valve replacement was performed on 08/12/2023.  She underwent TEE and cardioversion on 08/22/2023.  Both cardiothoracic surgery and heart failure team are following the patient.  She is on penicillin  G per ID Recommendations for 6 weeks starting from 08/13/2023 (6/25).  Patient has been up with therapy and today ambulated 160 feet contact-guard assistance level.  She was min assist for sit to stand transfers.  She worked with occupational therapy yesterday and required minimal assistance for basic ADLs.  Patient lives at home with her children in a 1 level house with 2 steps to enter.  She was independent and driving prior to this hospitalization.  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[employer]: Home Living Family/patient expects to be discharged to:: Private residence Living Arrangements: Children Available Help at Discharge: Family, Available 24 hours/day Type of Home: House Home Access: Stairs to enter Secretary/administrator of Steps: 2 Entrance Stairs-Rails: None Home Layout: One level Bathroom Shower/Tub: Tub/shower unit, Engineer, building services: Pharmacist, community:  Yes Home Equipment: None Additional Comments: 24/7 between son and caretaker  Lives With: Son  Functional History: Prior Function Prior Level of Function : Independent/Modified Independent, Driving Mobility Comments: independent without AD, no falls in past 6 months but reports balance has been off for ~5 days; 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: independent Functional Status:  Mobility: Bed Mobility Overal bed mobility: Needs Assistance Bed Mobility: Supine to Sit Rolling: Contact guard assist Sidelying to sit: Min assist Supine to sit: Min assist Sit to sidelying: Min assist General bed mobility comments: assist to elevate trunk and maintain sternal precautions Transfers Overall transfer level: Needs assistance Equipment used: Rolling walker (2 wheels) Transfers: Sit to/from Stand Sit to Stand: Min assist Bed to/from chair/wheelchair/BSC transfer type:: Step pivot Step pivot transfers: Min assist, Contact guard assist General transfer comment: Pt using momentum to power up, hugging heart pillow to maintain sternal precautions Ambulation/Gait Ambulation/Gait assistance: Contact guard assist Gait Distance (Feet): 160 Feet Assistive device: Rolling walker (2 wheels) Gait Pattern/deviations: Step-through pattern, Decreased stride length General Gait Details: slow, steady gait with RW Gait velocity: decreased Gait velocity interpretation: <1.31 ft/sec, indicative of household ambulator    ADL: ADL Overall ADL's : Needs assistance/impaired Eating/Feeding: Set up, Bed level Grooming: Wash/dry hands, Wash/dry face, Oral care, Set up, Sitting Grooming Details (indicate cue type and reason): at sink Upper Body Bathing: Set up, Sitting Upper Body Bathing Details (indicate cue type and reason): at sink Lower Body Bathing: Maximal assistance, Sitting/lateral leans, +2 for safety/equipment, Sit to/from stand Upper Body Dressing : Maximal assistance, Sitting Lower Body Dressing:  Maximal assistance, Sit to/from stand, Total assistance, +2 for  safety/equipment, Sitting/lateral leans Toilet Transfer: Minimal assistance, BSC/3in1 Toilet Transfer Details (indicate cue type and reason): able to transfer from EOB to [facility] with min assist Toileting- Clothing Manipulation and Hygiene: Sit to/from stand, Minimal assistance Toileting - Clothing Manipulation Details (indicate cue type and reason): able to perform toilet hygiene while standing with min assist for clothing management Functional mobility during ADLs: Minimal assistance, Rolling walker (2 wheels) General ADL Comments: uses momentum and min assist for sit to stand and min assist for stability for transfers  Cognition: Cognition Orientation Level: Oriented X4 Cognition Arousal: Alert Behavior During Therapy: WFL for tasks assessed/performed   Review of Systems  Constitutional: Negative.   HENT: Negative.    Eyes: Negative.   Respiratory:  Positive for cough and shortness of breath.   Cardiovascular:  Positive for leg swelling. Negative for palpitations.  Gastrointestinal: Negative.   Genitourinary: Negative.   Musculoskeletal: Negative.   Skin: Negative.   Neurological:  Positive for weakness.  Psychiatric/Behavioral:  Negative for depression.    Past Medical History:  Diagnosis Date   Anxiety    Breast cancer (HCC)    Diabetes mellitus without complication (HCC)    Hyperlipidemia    Hypertension    Type 2 diabetes mellitus without retinopathy (HCC) 06/17/2017   Past Surgical History:  Procedure Laterality Date   ABDOMINAL HYSTERECTOMY     BREAST BIOPSY     CARDIOVERSION N/A 08/22/2023   Procedure: CARDIOVERSION;  Surgeon: Lauralee Poll, MD;  Location: [facility] INVASIVE CV LAB;  Service: Cardiovascular;  Laterality: N/A;   CLIPPING OF ATRIAL APPENDAGE  08/13/2023   Procedure: CLIPPING, LEFT ATRIAL APPENDAGE USING ATRICURE LAA EXCLUSION SYSTEM SIZE 40;  Surgeon: [name], MD;  Location: [facility]  OR;  Service: Open Heart Surgery;;   IABP INSERTION N/A 08/08/2023   Procedure: IABP Insertion;  Surgeon: Swaziland, Peter M, MD;  Location: [facility] INVASIVE CV LAB;  Service: Cardiovascular;  Laterality: N/A;   INTRAOPERATIVE TRANSESOPHAGEAL ECHOCARDIOGRAM N/A 08/13/2023   Procedure: ECHOCARDIOGRAM, TRANSESOPHAGEAL, INTRAOPERATIVE;  Surgeon: [name], MD;  Location: [facility] OR;  Service: Open Heart Surgery;  Laterality: N/A;   MASTECTOMY Left 2016   MITRAL VALVE REPLACEMENT N/A 08/13/2023   Procedure: MITRAL VALVE REPLACEMENT USING ON-X PROSTHETIC MITRAL HEART VALVE SIZE 31/33MM;  Surgeon: [name], MD;  Location: [facility] OR;  Service: Open Heart Surgery;  Laterality: N/A;   RIGHT/LEFT HEART CATH AND CORONARY ANGIOGRAPHY N/A 08/08/2023   Procedure: RIGHT/LEFT HEART CATH AND CORONARY ANGIOGRAPHY;  Surgeon: Swaziland, Peter M, MD;  Location: [facility] INVASIVE CV LAB;  Service: Cardiovascular;  Laterality: N/A;   TOOTH EXTRACTION N/A 08/07/2023   Procedure: EXTRACTION TEETH NUMBERS ELEVEN, THIRTEEN, AND FOURTEEN;  Surgeon: Ascencion Lava, DMD;  Location: [facility] OR;  Service: Oral Surgery;  Laterality: N/A;   TRANSESOPHAGEAL ECHOCARDIOGRAM (CATH LAB) N/A 08/04/2023   Procedure: TRANSESOPHAGEAL ECHOCARDIOGRAM;  Surgeon: Hazle Lites, MD;  Location: [facility] INVASIVE CV LAB;  Service: Cardiovascular;  Laterality: N/A;   TRANSESOPHAGEAL ECHOCARDIOGRAM (CATH LAB) N/A 08/22/2023   Procedure: TRANSESOPHAGEAL ECHOCARDIOGRAM;  Surgeon: Lauralee Poll, MD;  Location: [facility] INVASIVE CV LAB;  Service: Cardiovascular;  Laterality: N/A;   Family History  Problem Relation Age of Onset   Asthma Mother    Cancer Father        prostate cancer   Diabetes Father    Hypertension Father    Cancer Brother        prostate cancer   Asthma Brother    Colon cancer Neg Hx  Colon polyps Neg Hx    Crohn's disease Neg Hx    Esophageal cancer Neg Hx    Rectal cancer Neg Hx    Stomach cancer Neg Hx    Ulcerative colitis Neg Hx     Social History:  reports that she has never smoked. She has never been exposed to tobacco smoke. She has never used smokeless tobacco. She reports that she does not drink alcohol and does not use drugs. Allergies: No Known Allergies Medications Prior to Admission  Medication Sig Dispense Refill   alendronate  (FOSAMAX ) 70 MG tablet Take 1 tablet (70 mg total) by mouth once a week. Take with a full glass of water on an empty stomach. 12 tablet 3   amLODipine  (NORVASC ) 10 MG tablet Take 1 tablet (10 mg total) by mouth at bedtime. (Patient taking differently: Take 20 mg by mouth at bedtime.) 90 tablet 1   anastrozole  (ARIMIDEX ) 1 MG tablet Take 1 mg by mouth daily.     cloNIDine  (CATAPRES ) 0.1 MG tablet Take 1 tablet (0.1 mg total) by mouth 2 (two) times daily. 180 tablet 3   glipiZIDE  (GLUCOTROL  XL) 10 MG 24 hr tablet Take 1 tablet (10 mg total) by mouth daily with breakfast. 90 tablet 3   insulin  glargine (LANTUS ) 100 UNIT/ML Solostar Pen Inject 20 Units into the skin daily. 3 mL 0   lisinopril  (ZESTRIL ) 40 MG tablet Take 1 tablet (40 mg total) by mouth daily. 90 tablet 3   metFORMIN  (GLUCOPHAGE ) 850 MG tablet Take 1 tablet (850 mg total) by mouth 2 (two) times daily with a meal. 60 tablet 0   metoprolol  succinate (TOPROL -XL) 100 MG 24 hr tablet Take 1 tablet (100 mg total) by mouth daily. 90 tablet 3   pravastatin  (PRAVACHOL ) 20 MG tablet Take 1 tablet (20 mg total) by mouth daily. 90 tablet 3   Vitamin D , Ergocalciferol , (DRISDOL ) 1.25 MG (50000 UNIT) CAPS capsule TAKE 1 CAPSULE ONCE A WEEK FOR 12 WEEKS. AFTER 12 WEEKS SWITCH TO OVER THE COUNTER VITAMIN D  2000 IU 12 capsule 0   Continuous Glucose Sensor (FREESTYLE LIBRE 3 PLUS SENSOR) MISC Inject 1 Units into the skin every 14 (fourteen) days. Change sensor every 15 days. 2 each 11   Insulin  Pen Needle (PEN NEEDLES) 31G X 6 MM MISC 1 application  by Does not apply route at bedtime. 90 each 3     Blood pressure 114/81, pulse 86, temperature  97.9 F (36.6 C), temperature source Oral, resp. rate 19, height 5\' 5"  (1.651 m), weight 114.6 kg, SpO2 95%. Physical Exam Constitutional:      General: She is not in acute distress.    Appearance: She is not ill-appearing.  HENT:     Head: Normocephalic.     Right Ear: External ear normal.     Left Ear: External ear normal.     Nose: Nose normal.     Mouth/Throat:     Mouth: Mucous membranes are moist.  Eyes:     Extraocular Movements: Extraocular movements intact.     Pupils: Pupils are equal, round, and reactive to light.  Cardiovascular:     Rate and Rhythm: Normal rate.  Pulmonary:     Effort: Pulmonary effort is normal.  Abdominal:     Palpations: Abdomen is soft.  Musculoskeletal:     Cervical back: Normal range of motion.     Right lower leg: Edema present.     Left lower leg: Edema present.  Skin:  General: Skin is warm and dry.  Neurological:     Mental Status: She is alert.     Comments: Alert and oriented x 3. Normal insight and awareness. Intact Memory. Normal language and speech. Cranial nerve exam unremarkable. MMT: BUE grossly 4 to 4+/5 prox to distal. BLE 4 prox to 4+/5 distally.  Sensory exam normal for light touch and pain in all 4 limbs. No limb ataxia or cerebellar signs. No abnormal tone appreciated.  [name]    Psychiatric:        Mood and Affect: Mood normal.        Behavior: Behavior normal.     Results for orders placed or performed during the hospital encounter of 07/31/23 (from the past 24 hours)  Glucose, capillary     Status: Abnormal   Collection Time: 08/25/23 12:22 PM  Result Value Ref Range   Glucose-Capillary 120 (H) 70 - 99 mg/dL   Comment 1 Notify RN    Comment 2 Document in Chart   Basic metabolic panel     Status: Abnormal   Collection Time: 08/25/23  4:22 PM  Result Value Ref Range   Sodium 138 135 - 145 mmol/L   Potassium 5.8 (H) 3.5 - 5.1 mmol/L   Chloride 106 98 - 111 mmol/L   CO2 23 22 - 32 mmol/L   Glucose, Bld 190 (H) 70 -  99 mg/dL   BUN 19 8 - 23 mg/dL   Creatinine, Ser 5.36 (H) 0.44 - 1.00 mg/dL   Calcium  8.6 (L) 8.9 - 10.3 mg/dL   GFR, Estimated 50 (L) >60 mL/min   Anion gap 9 5 - 15  Glucose, capillary     Status: Abnormal   Collection Time: 08/25/23  4:41 PM  Result Value Ref Range   Glucose-Capillary 165 (H) 70 - 99 mg/dL   Comment 1 Notify RN    Comment 2 Document in Chart   Glucose, capillary     Status: None   Collection Time: 08/25/23  8:16 PM  Result Value Ref Range   Glucose-Capillary 81 70 - 99 mg/dL  Glucose, capillary     Status: None   Collection Time: 08/26/23 12:15 AM  Result Value Ref Range   Glucose-Capillary 70 70 - 99 mg/dL  Glucose, capillary     Status: None   Collection Time: 08/26/23  4:01 AM  Result Value Ref Range   Glucose-Capillary 85 70 - 99 mg/dL  Protime-INR     Status: Abnormal   Collection Time: 08/26/23  5:50 AM  Result Value Ref Range   Prothrombin Time 47.3 (H) 11.4 - 15.2 seconds   INR 5.1 (HH) 0.8 - 1.2  CBC     Status: Abnormal   Collection Time: 08/26/23  5:50 AM  Result Value Ref Range   WBC 6.5 4.0 - 10.5 K/uL   RBC 3.21 (L) 3.87 - 5.11 MIL/uL   Hemoglobin 9.2 (L) 12.0 - 15.0 g/dL   HCT 64.4 (L) 03.4 - 74.2 %   MCV 89.1 80.0 - 100.0 fL   MCH 28.7 26.0 - 34.0 pg   MCHC 32.2 30.0 - 36.0 g/dL   RDW 59.5 63.8 - 75.6 %   Platelets 429 (H) 150 - 400 K/uL   nRBC 0.0 0.0 - 0.2 %  Magnesium      Status: None   Collection Time: 08/26/23  5:50 AM  Result Value Ref Range   Magnesium  2.1 1.7 - 2.4 mg/dL  Basic metabolic panel with GFR  Status: Abnormal   Collection Time: 08/26/23  5:50 AM  Result Value Ref Range   Sodium 138 135 - 145 mmol/L   Potassium 5.6 (H) 3.5 - 5.1 mmol/L   Chloride 108 98 - 111 mmol/L   CO2 24 22 - 32 mmol/L   Glucose, Bld 90 70 - 99 mg/dL   BUN 15 8 - 23 mg/dL   Creatinine, Ser 1.61 (H) 0.44 - 1.00 mg/dL   Calcium  8.4 (L) 8.9 - 10.3 mg/dL   GFR, Estimated >09 >60 mL/min   Anion gap 6 5 - 15  Cooxemetry Panel (carboxy,  met, total hgb, O2 sat)     Status: Abnormal   Collection Time: 08/26/23  5:54 AM  Result Value Ref Range   Total hemoglobin 9.4 (L) 12.0 - 16.0 g/dL   O2 Saturation 45.4 %   Carboxyhemoglobin 1.8 (H) 0.5 - 1.5 %   Methemoglobin 0.7 0.0 - 1.5 %  Glucose, capillary     Status: Abnormal   Collection Time: 08/26/23  8:15 AM  Result Value Ref Range   Glucose-Capillary 194 (H) 70 - 99 mg/dL   No results found.  Assessment/Plan: Diagnosis: 63 year old female with debility after bacterial endocarditis, status post mitral valve replacement and multiple associated complications. Does the need for close, 24 hr/day medical supervision in concert with the patient's rehab needs make it unreasonable for this patient to be served in a less intensive setting? Probably not Co-Morbidities requiring supervision/potential complications:  - Infectious disease considerations -Recent cardiac shock, atrial flutter with RVR -Acute blood loss anemia -Elevated INR, need for anticoagulation due to mitral valve replacement -Diabetes -Morbid obesity Due to bladder management, bowel management, safety, skin/wound care, disease management, medication administration, pain management, and patient education, does the patient require 24 hr/day rehab nursing? Potentially Does the patient require coordinated care of a physician, rehab nurse, therapy disciplines of PT, OT to address physical and functional deficits in the context of the above medical diagnosis(es)? No Addressing deficits in the following areas: balance, endurance, locomotion, transferring, bowel/bladder control, dressing, feeding, toileting, and psychosocial support Can the patient actively participate in an intensive therapy program of at least 3 hrs of therapy per day at least 5 days per week? Yes The potential for patient to make measurable gains while on inpatient rehab is fair Anticipated functional outcomes upon discharge from inpatient rehab are n/a   with PT, n/a with OT, n/a with SLP. Estimated rehab length of stay to reach the above functional goals is: n/a Anticipated discharge destination: Home Overall Rehab/Functional Prognosis: excellent  POST ACUTE RECOMMENDATIONS: This patient's condition is appropriate for continued rehabilitative care in the following setting: HH -->> Outpatient PT and OT Patient has agreed to participate in recommended program. Yes Note that insurance prior authorization may be required for reimbursement for recommended care.  Comment: Pt has a son at home who's going to take time off. She has friends who work as Chemical engineer who will help also.  Additionally, she walked 160' contact guard assist today. Will get up again later this afternoon. She can go home with follow up recommended above. She's in agreement.    I have personally performed a face to face diagnostic evaluation of this patient. Additionally, I have examined the patient's medical record including any pertinent labs and radiographic images.    Thanks,  Rawland Caddy, MD 08/26/2023

## 2023-08-26 NOTE — Progress Notes (Signed)
 Patient ID: Melissa Bray, female   DOB: [birth year], 63 y.o.   MRN: [id number]                                                                                               Advanced Heart Failure Rounding Note   Chief Complaint: CHF  Subjective:    63 y/o obese woman admitted with cardiogenic shock in setting of severe MR due to endocarditis and perforation of anterior MV leaflet. Underwent On-X mechanical MVR on 08/14/23. Post-op course c/b RV Failure  POD #13 s/p MVR  Creatinine WNL. K 5.6 today? CVP 4/5.    S/p DCCV to NSR 5/23, she remains in NSR now on po amiodarone .  INR 5.1?. Warfarin held yesterday with elevated INR.    Feels good. Just returned from a walk with PT. Denies CP/SOB.   Objective:   Echo (5/24): EF 55-60%, mildly D-shaped ventricle with mild RV dysfunction and mild RV enlargement, IVC dilated, On-X mechanical MV functioning normally.   Weight Range: 114.6 kg Body mass index is 42.04 kg/m.   Vital Signs:   Temp:  [97.8 F (36.6 C)-98.7 F (37.1 C)] 97.9 F (36.6 C) (05/27 0738) Pulse Rate:  [86-92] 86 (05/27 0354) Resp:  [16-19] 19 (05/27 0738) BP: (114-145)/(60-95) 114/81 (05/27 0738) SpO2:  [95 %-100 %] 95 % (05/27 0738) Weight:  [114.6 kg] 114.6 kg (05/27 0645) Last BM Date : 08/25/23  Weight change: Filed Weights   08/24/23 0506 08/25/23 0330 08/26/23 0645  Weight: 117.3 kg 114.7 kg 114.6 kg   Intake/Output:  Intake/Output Summary (Last 24 hours) at 08/26/2023 0751 Last data filed at 08/25/2023 2217 Gross per 24 hour  Intake 775.3 ml  Output 500 ml  Net 275.3 ml    Physical Exam  General:  well appearing.  No respiratory difficulty Neck: supple. JVD flat.  Cor: PMI nondisplaced. Regular rate & rhythm. No rubs, gallops or murmurs. Lungs: clear Extremities: no cyanosis, clubbing, rash, edema. PICC RUE Neuro: alert & oriented x 3. Moves all 4 extremities w/o difficulty. Affect pleasant.   Telemetry   NSR 90s (Personally reviewed)     Labs    CBC Recent Labs    08/25/23 0445 08/26/23 0550  WBC 7.0 6.5  HGB 9.6* 9.2*  HCT 30.6* 28.6*  MCV 90.3 89.1  PLT 469* 429*   Basic Metabolic Panel Recent Labs    78/29/56 0445 08/25/23 1622 08/26/23 0550  NA 140 138 138  K 5.4* 5.8* 5.6*  CL 105 106 108  CO2 24 23 24   GLUCOSE 135* 190* 90  BUN 20 19 15   CREATININE 1.15* 1.21* 1.04*  CALCIUM  9.1 8.6* 8.4*  MG 2.0  --  2.1   Imaging   No results found.     Medications:    Scheduled Medications:  amiodarone   400 mg Oral BID   anastrozole   1 mg Oral Daily   carvedilol  3.125 mg Oral BID WC   Chlorhexidine  Gluconate Cloth  6 each Topical Daily   [facility], Patient & Family Education   Does not apply Once   feeding  supplement  237 mL Oral BID BM   fiber supplement (BANATROL TF)  60 mL Oral QID   insulin  aspart  0-15 Units Subcutaneous TID WC   insulin  aspart  0-5 Units Subcutaneous QHS   insulin  aspart  5 Units Subcutaneous TID WC   insulin  glargine-yfgn  30 Units Subcutaneous BID   pantoprazole   40 mg Oral Daily   sodium chloride  flush  10-40 mL Intracatheter Q12H   sodium chloride  flush  3 mL Intravenous Q12H   spironolactone   25 mg Oral Daily   torsemide  40 mg Oral Daily   Warfarin - Pharmacist Dosing Inpatient   Does not apply q1600   Infusions:  penicillin  G potassium 12 Million Units in sodium chloride  0.9 % 500 mL CONTINUOUS infusion 12 Million Units (08/26/23 0738)   PRN Medications: clonazePAM , dextrose , Gerhardt's butt cream, melatonin, ondansetron  (ZOFRAN ) IV, mouth rinse, oxyCODONE , senna-docusate, sodium chloride  flush, sodium chloride  flush, traMADol   Assessment/Plan   1. Cardiogenic shock: Valvular shock from severe MR.  TEE on 5/5 showed LV EF 65%, normal RV function, severe MR with perforated leaflet and mobile vegetation.  Patient developed flash pulmonary edema 5/9 in setting of marked hypertension -> IABP placed. - s/p mMVR 08/14/23 - Echo 5/24: EF 55-60%,  mildly D-shaped ventricle with mild RV dysfunction and mild RV enlargement, IVC dilated, On-X mechanical MV functioning normally.  - CVP 4/5 today. Decrease Torsemide to 20 mg daily - Start Farxiga 10 mg daily today - Milrinone  stopped 5/22, stable.   - K still elevated. Hold spiro. See #8. Will order peripheral stick BMET  2. Mitral regurgitation: Severe on TEE with perforated anterior leaflet and mitral valve vegetation.  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  - s/p On-X mechanical MVR with Dr. Luna Salinas 08/14/23 - post-op antibiotics per ID. On PenG, will require 6 weeks IV abx (start date 05/14). - Now on warfarin, goal INR 2.5 -3.5. Also on ASA 81. Holding warfarin since 5/26 with elevated INR.    3. Acute hypercarbic/hypoxemic respiratory failure in setting of #1&2 - Resolved - Encouraged IS   4. Atrial flutter with RVR - S/p DCCV 5/23. Maintaining NSR  - Transitioned to po amiodarone .  - Continue to hold warfarin with INR at 5.1 today. (Held since 5/26)  5. Morbid obesity - Body mass index is 42.04 kg/m.  6. Hypervolemic Hyponatremia - resolved w/ diuresis.    7. Anemia - hgb 9.2 today.  - labs c/w IDA but given acute infection will hold off on IV Fe   8. Hyperkalemia - K 5.6 this morning - Stopping spiro as above - Concerned about false elevation with PCN G infusing through PICC as well.  - Plan for peripheral stick BMET  Continue to mobilize.   Length of Stay: 24  Sheryl Donna, NP  08/26/2023, 7:51 AM  Advanced Heart Failure Team Pager [phone] (M-F; 7a - 5p)  Please contact [facility] for night-coverage after hours (5p -7a ) and weekends on [email or web address]

## 2023-08-26 NOTE — Progress Notes (Signed)
 Inpatient Rehab Admissions Coordinator:    CIR following, pt. Looks potentially ready for CIR admit once insurance approves. I will send case today.   Wandalee Gust, MS, CCC-SLP Rehab Admissions Coordinator  [phone] (celll) [phone] (office)

## 2023-08-27 DIAGNOSIS — I340 Nonrheumatic mitral (valve) insufficiency: Secondary | ICD-10-CM | POA: Diagnosis not present

## 2023-08-27 LAB — COOXEMETRY PANEL
Carboxyhemoglobin: 1.4 % (ref 0.5–1.5)
Methemoglobin: <0.7 % (ref 0.0–1.5)
O2 Saturation: 72.3 %
Total hemoglobin: 9.9 g/dL — ABNORMAL LOW (ref 12.0–16.0)

## 2023-08-27 LAB — CBC
HCT: 30.2 % — ABNORMAL LOW (ref 36.0–46.0)
Hemoglobin: 9.4 g/dL — ABNORMAL LOW (ref 12.0–15.0)
MCH: 28 pg (ref 26.0–34.0)
MCHC: 31.1 g/dL (ref 30.0–36.0)
MCV: 89.9 fL (ref 80.0–100.0)
Platelets: 470 10*3/uL — ABNORMAL HIGH (ref 150–400)
RBC: 3.36 MIL/uL — ABNORMAL LOW (ref 3.87–5.11)
RDW: 14.6 % (ref 11.5–15.5)
WBC: 7.4 10*3/uL (ref 4.0–10.5)
nRBC: 0 % (ref 0.0–0.2)

## 2023-08-27 LAB — BASIC METABOLIC PANEL WITH GFR
Anion gap: 6 (ref 5–15)
BUN: 14 mg/dL (ref 8–23)
CO2: 24 mmol/L (ref 22–32)
Calcium: 8.9 mg/dL (ref 8.9–10.3)
Chloride: 108 mmol/L (ref 98–111)
Creatinine, Ser: 0.99 mg/dL (ref 0.44–1.00)
GFR, Estimated: >60 mL/min (ref >60)
Glucose, Bld: 75 mg/dL (ref 70–99)
Potassium: 4 mmol/L (ref 3.5–5.1)
Sodium: 138 mmol/L (ref 135–145)

## 2023-08-27 LAB — GLUCOSE, CAPILLARY
Glucose-Capillary: 73 mg/dL (ref 70–99)
Glucose-Capillary: 225 mg/dL — ABNORMAL HIGH (ref 70–99)
Glucose-Capillary: 67 mg/dL — ABNORMAL LOW (ref 70–99)
Glucose-Capillary: 102 mg/dL — ABNORMAL HIGH (ref 70–99)
Glucose-Capillary: 78 mg/dL (ref 70–99)
Glucose-Capillary: 120 mg/dL — ABNORMAL HIGH (ref 70–99)

## 2023-08-27 LAB — ECHO TEE
AV Mean grad: 3 mmHg
AV Peak grad: 4.8 mmHg
Ao pk vel: 1.1 m/s

## 2023-08-27 LAB — PROTIME-INR
INR: 4 — ABNORMAL HIGH (ref 0.8–1.2)
Prothrombin Time: 39.3 s — ABNORMAL HIGH (ref 11.4–15.2)

## 2023-08-27 LAB — MAGNESIUM: Magnesium: 2.1 mg/dL (ref 1.7–2.4)

## 2023-08-27 MED ORDER — LOSARTAN POTASSIUM 25 MG PO TABS
12.5000 mg | ORAL_TABLET | Freq: Every day | ORAL | Status: DC
  Administered 2023-08-27 – 2023-08-28 (×2): 12.5 mg via ORAL
  Filled 2023-08-27 (×2): qty 1

## 2023-08-27 MED ORDER — SPIRONOLACTONE 12.5 MG HALF TABLET
12.5000 mg | ORAL_TABLET | Freq: Every day | ORAL | Status: DC
  Administered 2023-08-27 – 2023-08-28 (×2): 12.5 mg via ORAL
  Filled 2023-08-27 (×3): qty 1

## 2023-08-27 MED ORDER — INSULIN GLARGINE-YFGN 100 UNIT/ML ~~LOC~~ SOLN
25.0000 [IU] | Freq: Two times a day (BID) | SUBCUTANEOUS | Status: DC
  Administered 2023-08-27 – 2023-08-29 (×4): 25 [IU] via SUBCUTANEOUS
  Filled 2023-08-27 (×5): qty 0.25

## 2023-08-27 MED ORDER — INSULIN ASPART 100 UNIT/ML IJ SOLN
3.0000 [IU] | Freq: Three times a day (TID) | INTRAMUSCULAR | Status: DC
  Administered 2023-08-28 (×3): 3 [IU] via SUBCUTANEOUS

## 2023-08-27 MED ORDER — SPIRONOLACTONE 25 MG PO TABS: 25.0000 mg | ORAL_TABLET | Freq: Every day | ORAL | Status: DC

## 2023-08-27 NOTE — Progress Notes (Signed)
 Occupational Therapy Treatment Patient Details Name: Melissa Bray MRN: [id number] DOB: 02/07/1961 Today's Date: 08/27/2023   History of present illness 63 y.o. female presents to [facility] 07/31/23 from [place]. Originally diagnosed with sepsis 2/2 LUE cellulitis with transfer to [facility] after murmur found. Pt with severe mitral regurgitation 2/2 endocarditis. 5/5 TEE. 5/8 extraction of teeth. 5/9 quick deterioration 2/2 flash pulmonary edema and cardiogenic shock before LHC was performed. 5/9-5/12 intubated. 5/9-5/15 IABP. 5/13 MVR. 5/23 cardioversion and TEE. PMHx: T2DM, HTN, obesity, GAD, sinus tachycardia   OT comments  Patient received up in recliner and agreeable to OT treatment. Patient requiring min assist for sit to stand from recliner with patient using heart pillow and momentum. Patient performed bathing and dressing seated/standing at sink with patient demonstrating gains in both UB and LB dressing. Patient returned to recliner at end of session. Patient will benefit from intensive inpatient follow-up therapy, >3 hours/day. Acute OT to continue to follow to address established goals to facilitate DC to next venue of care.        If plan is discharge home, recommend the following:  A lot of help with bathing/dressing/bathroom;A lot of help with walking and/or transfers   Equipment Recommendations  Tub/shower seat;BSC/3in1    Recommendations for Other Services      Precautions / Restrictions Precautions Precautions: Sternal;Fall Precaution Booklet Issued: Yes (comment) Recall of Precautions/Restrictions: Intact Precaution/Restrictions Comments: good recall of precautions Restrictions Weight Bearing Restrictions Per Provider Order: Yes RUE Weight Bearing Per Provider Order: Non weight bearing LUE Weight Bearing Per Provider Order: Non weight bearing Other Position/Activity Restrictions: sternal       Mobility Bed Mobility Overal bed mobility: Needs Assistance              General bed mobility comments: OOB in recliner    Transfers Overall transfer level: Needs assistance Equipment used: Rolling walker (2 wheels) Transfers: Sit to/from Stand Sit to Stand: Min assist           General transfer comment: uses momentum and heart pillow with min assist to stand     Balance Overall balance assessment: Needs assistance Sitting-balance support: No upper extremity supported, Feet supported Sitting balance-Leahy Scale: Good     Standing balance support: No upper extremity supported, Bilateral upper extremity supported, During functional activity Standing balance-Leahy Scale: Fair Standing balance comment: able to stand at sink with no UE support                           ADL either performed or assessed with clinical judgement   ADL Overall ADL's : Needs assistance/impaired     Grooming: Wash/dry hands;Wash/dry face;Oral care;Set up;Sitting Grooming Details (indicate cue type and reason): at sink Upper Body Bathing: Set up;Sitting Upper Body Bathing Details (indicate cue type and reason): at sink Lower Body Bathing: Moderate assistance;Sit to/from stand Lower Body Bathing Details (indicate cue type and reason): bathed peri area front seated and stood for peri area back Upper Body Dressing : Minimal assistance;Sitting Upper Body Dressing Details (indicate cue type and reason): to change gowns Lower Body Dressing: Moderate assistance;Sit to/from stand Lower Body Dressing Details (indicate cue type and reason): able to doff socks but required assistance to get over toes and patient able to complete while in figure 4             Functional mobility during ADLs: Minimal assistance;Rolling walker (2 wheels) General ADL Comments: continues to require momentum and min assist  to stand    Extremity/Trunk Assessment Upper Extremity Assessment Upper Extremity Assessment: Overall WFL for tasks assessed            Vision        Perception     Praxis     Communication Communication Communication: No apparent difficulties Factors Affecting Communication: Reduced clarity of speech (soft spoken)   Cognition Arousal: Alert Behavior During Therapy: WFL for tasks assessed/performed Cognition: No apparent impairments         Attention impairment (select first level of impairment): Sustained attention Executive functioning impairment (select all impairments): Sequencing OT - Cognition Comments: Patient states she was emotional earlier but did not demonstrate during session                 Following commands: Intact Following commands impaired: Only follows one step commands consistently, Follows multi-step commands inconsistently      Cueing   Cueing Techniques: Verbal cues  Exercises      Shoulder Instructions       General Comments VSS on RA    Pertinent Vitals/ Pain       Pain Assessment Pain Assessment: No/denies pain Pain Intervention(s): Monitored during session  Home Living                                          Prior Functioning/Environment              Frequency  Min 2X/week        Progress Toward Goals  OT Goals(current goals can now be found in the care plan section)  Progress towards OT goals: Progressing toward goals  Acute Rehab OT Goals Patient Stated Goal: get better OT Goal Formulation: With patient Time For Goal Achievement: 09/01/23 Potential to Achieve Goals: Good ADL Goals Pt Will Perform Grooming: with supervision;standing Pt Will Perform Upper Body Dressing: with supervision;sitting;standing Pt Will Perform Lower Body Dressing: with supervision;sit to/from stand Pt Will Transfer to Toilet: with supervision;ambulating;regular height toilet Pt/caregiver will Perform Home Exercise Program: Increased ROM;Increased strength;Both right and left upper extremity;With Supervision  Plan      Co-evaluation                  AM-PAC OT "6 Clicks" Daily Activity     Outcome Measure   Help from another person eating meals?: A Little Help from another person taking care of personal grooming?: A Little Help from another person toileting, which includes using toliet, bedpan, or urinal?: A Little Help from another person bathing (including washing, rinsing, drying)?: A Lot Help from another person to put on and taking off regular upper body clothing?: A Lot Help from another person to put on and taking off regular lower body clothing?: A Lot 6 Click Score: 15    End of Session Equipment Utilized During Treatment: Rolling walker (2 wheels)  OT Visit Diagnosis: Unsteadiness on feet (R26.81);Muscle weakness (generalized) (M62.81)   Activity Tolerance Patient tolerated treatment well   Patient Left in chair;with call bell/phone within reach   Nurse Communication Mobility status        Time: 1610-9604 OT Time Calculation (min): 29 min  Charges: OT General Charges $OT Visit: 1 Visit OT Treatments $Self Care/Home Management : 23-37 mins  Anitra Barn, OTA Acute Rehabilitation Services  Office [phone]   Jovita Nipper 08/27/2023, 11:19 AM

## 2023-08-27 NOTE — Progress Notes (Signed)
 M.D. please look at Chest incisions and Chest tube sites. Distal end of chest Incision is slightly open with yellowish drainage as well as C.T. sites.

## 2023-08-27 NOTE — Progress Notes (Signed)
 CARDIAC REHAB PHASE I    Post OHS education including site care, restrictions, heart healthy diabetic diet, sternal precautions, IS use at home, home needs at discharge, exercise guidelines and CRP2 reviewed. All questions and concerns addressed. Will refer to [facility] for CRP2. Awaiting CIR approval, will continue to follow.   [id number] Ronny Colas, RN BSN 08/27/2023 10:32 AM

## 2023-08-27 NOTE — Plan of Care (Signed)
 Problem: Education: Goal: Knowledge of General Education information will improve Description: Including pain rating scale, medication(s)/side effects and non-pharmacologic comfort measures Outcome: Progressing   Problem: Health Behavior/Discharge Planning: Goal: Ability to manage health-related needs will improve Outcome: Progressing   Problem: Clinical Measurements: Goal: Ability to maintain clinical measurements within normal limits will improve Outcome: Progressing Goal: Will remain free from infection Outcome: Progressing Goal: Diagnostic test results will improve Outcome: Progressing Goal: Respiratory complications will improve Outcome: Progressing Goal: Cardiovascular complication will be avoided Outcome: Progressing   Problem: Activity: Goal: Risk for activity intolerance will decrease Outcome: Progressing   Problem: Nutrition: Goal: Adequate nutrition will be maintained Outcome: Progressing   Problem: Coping: Goal: Level of anxiety will decrease Outcome: Progressing   Problem: Elimination: Goal: Will not experience complications related to bowel motility Outcome: Progressing Goal: Will not experience complications related to urinary retention Outcome: Progressing   Problem: Pain Managment: Goal: General experience of comfort will improve and/or be controlled Outcome: Progressing   Problem: Safety: Goal: Ability to remain free from injury will improve Outcome: Progressing   Problem: Skin Integrity: Goal: Risk for impaired skin integrity will decrease Outcome: Progressing   Problem: Education: Goal: Ability to describe self-care measures that may prevent or decrease complications (Diabetes Survival Skills Education) will improve Outcome: Progressing Goal: Individualized Educational Video(s) Outcome: Progressing   Problem: Coping: Goal: Ability to adjust to condition or change in health will improve Outcome: Progressing   Problem: Fluid  Volume: Goal: Ability to maintain a balanced intake and output will improve Outcome: Progressing   Problem: Health Behavior/Discharge Planning: Goal: Ability to identify and utilize available resources and services will improve Outcome: Progressing Goal: Ability to manage health-related needs will improve Outcome: Progressing   Problem: Metabolic: Goal: Ability to maintain appropriate glucose levels will improve Outcome: Progressing   Problem: Nutritional: Goal: Maintenance of adequate nutrition will improve Outcome: Progressing Goal: Progress toward achieving an optimal weight will improve Outcome: Progressing   Problem: Skin Integrity: Goal: Risk for impaired skin integrity will decrease Outcome: Progressing   Problem: Tissue Perfusion: Goal: Adequacy of tissue perfusion will improve Outcome: Progressing   Problem: Fluid Volume: Goal: Hemodynamic stability will improve Outcome: Progressing   Problem: Clinical Measurements: Goal: Signs and symptoms of infection will decrease Outcome: Progressing   Problem: Clinical Measurements: Goal: Ability to avoid or minimize complications of infection will improve Outcome: Progressing   Problem: Skin Integrity: Goal: Skin integrity will improve Outcome: Progressing   Problem: Education: Goal: Understanding of CV disease, CV risk reduction, and recovery process will improve Outcome: Progressing Goal: Individualized Educational Video(s) Outcome: Progressing   Problem: Activity: Goal: Ability to return to baseline activity level will improve Outcome: Progressing   Problem: Cardiovascular: Goal: Ability to achieve and maintain adequate cardiovascular perfusion will improve Outcome: Progressing Goal: Vascular access site(s) Level 0-1 will be maintained Outcome: Progressing   Problem: Health Behavior/Discharge Planning: Goal: Ability to safely manage health-related needs after discharge will improve Outcome: Progressing    Problem: Activity: Goal: Ability to tolerate increased activity will improve Outcome: Progressing   Problem: Respiratory: Goal: Ability to maintain a clear airway and adequate ventilation will improve Outcome: Progressing   Problem: Role Relationship: Goal: Method of communication will improve Outcome: Progressing   Problem: Cardiac: Goal: Ability to achieve and maintain adequate cardiopulmonary perfusion will improve Outcome: Progressing Goal: Vascular access site(s) Level 0-1 will be maintained Outcome: Progressing   Problem: Fluid Volume: Goal: Ability to achieve a balanced intake and output will improve Outcome: Progressing  Problem: Physical Regulation: Goal: Complications related to the disease process, condition or treatment will be avoided or minimized Outcome: Progressing

## 2023-08-27 NOTE — Progress Notes (Signed)
 Inpatient Rehab Admissions Coordinator:    CIR following, I continue to await insurance auth.   Wandalee Gust, MS, CCC-SLP Rehab Admissions Coordinator  [phone] (celll) [phone] (office)

## 2023-08-27 NOTE — Progress Notes (Signed)
 Patient ID: Melissa Bray, female   DOB: 11-27-1960, 63 y.o.   MRN: [id number]                                                                                               Advanced Heart Failure Rounding Note   Chief Complaint: CHF  Subjective:    63 y/o obese woman admitted with cardiogenic shock in setting of severe MR due to endocarditis and perforation of anterior MV leaflet. Underwent On-X mechanical MVR on 08/14/23. Post-op course c/b RV Failure  POD #14 s/p MVR  Creatinine WNL. K 4. CVP 5  S/p DCCV to NSR 5/23, she remains in NSR now on po amiodarone .  INR 4. Holding warfarin since 5/26 .  A little anxious this morning after she had to have her chest tube sites redressed. Otherwise she feels ok.   Objective:   Echo (5/24): EF 55-60%, mildly D-shaped ventricle with mild RV dysfunction and mild RV enlargement, IVC dilated, On-X mechanical MV functioning normally.   Weight Range: 115.5 kg Body mass index is 42.37 kg/m.   Vital Signs:   Temp:  [97.8 F (36.6 C)-99.2 F (37.3 C)] 98.7 F (37.1 C) (05/28 0407) Pulse Rate:  [80-88] 83 (05/28 0407) Resp:  [17-23] 19 (05/28 0407) BP: (112-181)/(56-108) 134/56 (05/28 0407) SpO2:  [98 %] 98 % (05/28 0407) Weight:  [115.5 kg] 115.5 kg (05/28 0407) Last BM Date : 08/25/23  Weight change: Filed Weights   08/25/23 0330 08/26/23 0645 08/27/23 0407  Weight: 114.7 kg 114.6 kg 115.5 kg   Intake/Output:  Intake/Output Summary (Last 24 hours) at 08/27/2023 0803 Last data filed at 08/27/2023 0612 Gross per 24 hour  Intake 333.6 ml  Output 340 ml  Net -6.4 ml    Physical Exam  General:  elderly / well appearing.  No respiratory difficulty Neck: supple. JVD flat.  Cor: PMI nondisplaced. Regular rate & rhythm. No rubs, gallops or murmurs. Lungs: clear Extremities: no cyanosis, clubbing, rash, edema. PICC RUE. LUE edema.  Neuro: alert & oriented x 3. Moves all 4 extremities w/o difficulty. Affect pleasant.   Telemetry   NSR  80s (Personally reviewed)    Labs    CBC Recent Labs    08/26/23 0550 08/27/23 0440  WBC 6.5 7.4  HGB 9.2* 9.4*  HCT 28.6* 30.2*  MCV 89.1 89.9  PLT 429* 470*   Basic Metabolic Panel Recent Labs    40/98/11 0550 08/26/23 0857 08/27/23 0440  NA 138 138 138  K 5.6* 4.5 4.0  CL 108 104 108  CO2 24 25 24   GLUCOSE 90 232* 75  BUN 15 16 14   CREATININE 1.04* 1.07* 0.99  CALCIUM  8.4* 9.4 8.9  MG 2.1  --  2.1   Imaging   No results found.   Medications:    Scheduled Medications:  amiodarone   200 mg Oral Daily   anastrozole   1 mg Oral Daily   carvedilol  3.125 mg Oral BID WC   Chlorhexidine  Gluconate Cloth  6 each Topical Daily   [facility], Patient & Family Education   Does not apply  Once   dapagliflozin  propanediol  10 mg Oral Daily   feeding supplement  237 mL Oral BID BM   fiber supplement (BANATROL TF)  60 mL Oral QID   insulin  aspart  0-15 Units Subcutaneous TID WC   insulin  aspart  0-5 Units Subcutaneous QHS   insulin  aspart  5 Units Subcutaneous TID WC   insulin  glargine-yfgn  30 Units Subcutaneous BID   pantoprazole   40 mg Oral Daily   sodium chloride  flush  10-40 mL Intracatheter Q12H   sodium chloride  flush  3 mL Intravenous Q12H   torsemide   20 mg Oral Daily   Warfarin - Pharmacist Dosing Inpatient   Does not apply q1600   Infusions:  penicillin  G potassium 12 Million Units in sodium chloride  0.9 % 500 mL CONTINUOUS infusion 12 Million Units (08/26/23 2048)   PRN Medications: clonazePAM , dextrose , Gerhardt's butt cream, melatonin, ondansetron  (ZOFRAN ) IV, mouth rinse, oxyCODONE , senna-docusate, sodium chloride  flush, sodium chloride  flush, traMADol   Assessment/Plan   1. Cardiogenic shock: Valvular shock from severe MR.  TEE on 5/5 showed LV EF 65%, normal RV function, severe MR with perforated leaflet and mobile vegetation.  Patient developed flash pulmonary edema 5/9 in setting of marked hypertension -> IABP placed. - s/p mMVR  08/14/23 - Echo 5/24: EF 55-60%, mildly D-shaped ventricle with mild RV dysfunction and mild RV enlargement, IVC dilated, On-X mechanical MV functioning normally.  - CVP 5 today. Continue Torsemide  20 mg daily - Continue Farxiga  10 mg daily today - Continue coreg  3.125 mg BID - Restart spiro 12.5 mg daily.  - Hypertensive today. Start losartan  12.5 mg daily - Milrinone  stopped 5/22, stable.    2. Mitral regurgitation: Severe on TEE with perforated anterior leaflet and mitral valve vegetation.  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  - s/p On-X mechanical MVR with Dr. Luna Salinas 08/14/23 - post-op antibiotics per ID. On PenG, will require 6 weeks IV abx (start date 05/14). - Now on warfarin, goal INR 2.5 -3.5. Also on ASA 81. Holding warfarin since 5/26 with elevated INR.    3. Acute hypercarbic/hypoxemic respiratory failure in setting of #1&2 - Resolved - Encouraged IS   4. Atrial flutter with RVR - S/p DCCV 5/23. Maintaining NSR  - Transitioned to po amiodarone .  - Continue to hold warfarin with INR at 4 today. (Held since 5/26)  5. Morbid obesity - Body mass index is 42.37 kg/m.  6. Hypervolemic Hyponatremia - resolved w/ diuresis.    7. Anemia - hgb 9.4 today.  - labs c/w IDA but given acute infection will hold off on IV Fe   8. Hyperkalemia - Resolved, 2/2 false elevation from PCN G infusing through PICC where blood was collected from.   Continue to mobilize.   Length of Stay: 25  Sheryl Donna, NP  08/27/2023, 8:03 AM  Advanced Heart Failure Team Pager [phone] (M-F; 7a - 5p)  Please contact [facility] for night-coverage after hours (5p -7a ) and weekends on [email or web address]

## 2023-08-27 NOTE — Progress Notes (Signed)
 [address]       Melissa Bray [address]             [phone]      5 Days Post-Op Procedure(s) (LRB): TRANSESOPHAGEAL ECHOCARDIOGRAM (N/A) CARDIOVERSION (N/A) Subjective: Patient sitting up in chair without complaints this AM  Objective: Vital signs in last 24 hours: Temp:  [97.8 F (36.6 C)-99.2 F (37.3 C)] 98.7 F (37.1 C) (05/28 0407) Pulse Rate:  [80-88] 83 (05/28 0407) Cardiac Rhythm: Heart block (05/27 1900) Resp:  [17-23] 19 (05/28 0407) BP: (112-181)/(56-108) 134/56 (05/28 0407) SpO2:  [95 %-98 %] 98 % (05/28 0407) Weight:  [115.5 kg] 115.5 kg (05/28 0407)  Hemodynamic parameters for last 24 hours: CVP:  [10 mmHg-15 mmHg] 15 mmHg  Intake/Output from previous day: 05/27 0701 - 05/28 0700 In: 333.6 [IV Piggyback:333.6] Out: 340 [Blood:240] Intake/Output this shift: No intake/output data recorded.  General appearance: alert, cooperative, and no distress Neurologic: intact Heart: regular rate and rhythm, S1, S2 normal, no murmur, click, rub or gallop Lungs: slightly diminished bibasilar Abdomen: soft, non-tender; bowel sounds normal; no masses,  no organomegaly Extremities: extremities normal, atraumatic, no cyanosis or edema Wound: Superior portion of the sternal incision is clean and dry without sign of infection, inferior portion of the sternal incision is superficially dehisced, no tracking, with sloughy tissue, sharply debrided at bedside  Lab Results: Recent Labs    08/26/23 0550 08/27/23 0440  WBC 6.5 7.4  HGB 9.2* 9.4*  HCT 28.6* 30.2*  PLT 429* 470*   BMET:  Recent Labs    08/26/23 0857 08/27/23 0440  NA 138 138  K 4.5 4.0  CL 104 108  CO2 25 24  GLUCOSE 232* 75  BUN 16 14  CREATININE 1.07* 0.99  CALCIUM  9.4 8.9    PT/INR:  Recent Labs    08/27/23 0440  LABPROT 39.3*  INR 4.0*   ABG    Component Value Date/Time   PHART 7.465 (H) 08/14/2023 1700   HCO3 25.0 08/14/2023 1700   TCO2 26 08/14/2023 1700    ACIDBASEDEF 1.0 08/08/2023 1634   O2SAT 72.3 08/27/2023 0530   CBG (last 3)  Recent Labs    08/26/23 2037 08/26/23 2354 08/27/23 0559  GLUCAP 139* 115* 73    Assessment/Plan: S/P Procedure(s) (LRB): TRANSESOPHAGEAL ECHOCARDIOGRAM (N/A) CARDIOVERSION (N/A)  CV: Postop PAF with RVR with successful TEE/DC-CV. NSR, HR 80s this AM. SBP 130s, up into the 150s yesterday. On PO Amiodarone  200mg  daily per AHF, Spironolactone  25mg  daily has been held due to hyperkalemia. On low dose Coreg. Coox 72.5 this AM. AHF team is following, appreciate their assistance.   Pulm: Saturating 100% on RA. Last CXR with bibasilar atelectasis. Encourage IS and ambulation   GI: +BM. Tolerating diet. No nausea, vomiting. Patient to continue to increase diet as tolerated.    Endo: CBGs controlled on Semglee  30U BID, Novolog  5U with meals and AC/HS SSI PRN.   Renal: Cr 0.99, stable. On Torsemide 20mg  daily per AHF. UO 500cc/24hrs recorded. +2lbs from yesterday but under preop weight. Hyperkalemia resolved, K 4.0 today. K supplement and Spironolactone  have been discontinued.  ID: Endocarditis, on Penicillin  G. ID recommending IB PCN G for 6 weeks starting from 05/14, end date of 06/25. They have signed off but will need to call at discharge to arrange outpatient follow up. Tmax 99.2, no leukocytosis.    Expected postop ABLA: H/H 9.4/30.2, stable. Not clinically significant at this time.    DVT  Prophylaxis: On Coumadin    Deconditioning: Continue work with PT/OT. Plan for CIR at discharge.   Anticoagulation: On Coumadin , INR 2.7>4.5>5.1>4 after holding Coumadin  last 2 nights. Pharmacy managing coumadin , plan to hold coumadin  tonight.   Sternal incision: Superficial dehiscence with sloughy tissue, sharply debrided at bedside. Will place wet to dry dressing and consult wound care for vashe wet to dry dressing.    Dispo: Hopefully can d/c to CIR later this week    LOS: 25 days    Melissa Burton,  PA-C 08/27/2023

## 2023-08-27 NOTE — Progress Notes (Signed)
 Physical Therapy Treatment Patient Details Name: Melissa Bray MRN: [id number] DOB: [birth year] Today's Date: 08/27/2023   History of Present Illness 63 y.o. female presents to [facility] 07/31/23 from [place]. Originally diagnosed with sepsis 2/2 LUE cellulitis with transfer to [facility] ([facility]) after murmur found. Pt with severe mitral regurgitation 2/2 endocarditis. 5/5 TEE. 5/8 extraction of teeth. 5/9 quick deterioration 2/2 flash pulmonary edema and cardiogenic shock before LHC was performed. 5/9-5/12 intubated. 5/9-5/15 IABP. 5/13 MVR. 5/23 cardioversion and TEE. PMHx: T2DM, HTN, obesity, GAD, sinus tachycardia    PT Comments  Pt up in chair on arrival, eager for mobility and demonstrating good progress towards acute goals. Pt continues to require light cues for sternal precaution adherence, especially with transfers and pt reaching for RW to stand from [facility]. Pt able to complete transfers with CGA with pt utilizing rocking technique for momentum. Pt progressing gait distance this session with RW for support and CGA for safety with no overt LOB noted. Pt verbalizing understanding of importance of frequent mobility to maximize functional mobility gains and agreeable to time up in chair at end of session. Pt continues to benefit from skilled PT services to progress toward functional mobility goals.     If plan is discharge home, recommend the following: A lot of help with bathing/dressing/bathroom;Assistance with cooking/housework;Assist for transportation;Help with stairs or ramp for entrance;A little help with walking and/or transfers   Can travel by private vehicle        Equipment Recommendations  Rolling walker (2 wheels);BSC/3in1    Recommendations for Other Services       Precautions / Restrictions Precautions Precautions: Sternal;Fall Precaution Booklet Issued: Yes (comment) Recall of Precautions/Restrictions: Intact Precaution/Restrictions Comments: good recall of precautions Restrictions Weight  Bearing Restrictions Per Provider Order: Yes Other Position/Activity Restrictions: sternal     Mobility  Bed Mobility Overal bed mobility: Needs Assistance             General bed mobility comments: OOB in recliner    Transfers Overall transfer level: Needs assistance Equipment used: Rolling walker (2 wheels) Transfers: Sit to/from Stand Sit to Stand: Contact guard assist           General transfer comment: uses momentum, cues for hands on knees or across chest    Ambulation/Gait Ambulation/Gait assistance: Contact guard assist Gait Distance (Feet): 240 Feet Assistive device: Rolling walker (2 wheels) Gait Pattern/deviations: Step-through pattern, Decreased stride length       General Gait Details: slow, steady gait with RW   Stairs             Wheelchair Mobility     Tilt Bed    Modified Rankin (Stroke Patients Only)       Balance Overall balance assessment: Needs assistance Sitting-balance support: No upper extremity supported, Feet supported Sitting balance-Leahy Scale: Good     Standing balance support: No upper extremity supported, Bilateral upper extremity supported, During functional activity Standing balance-Leahy Scale: Fair Standing balance comment: able to stand at sink with no UE support                            Communication Communication Communication: No apparent difficulties Factors Affecting Communication: Reduced clarity of speech (soft spoken)  Cognition Arousal: Alert Behavior During Therapy: [facility] for tasks assessed/performed                             Following commands: Intact  Following commands impaired: Only follows one step commands consistently, Follows multi-step commands inconsistently    Cueing Cueing Techniques: Verbal cues  Exercises      General Comments General comments (skin integrity, edema, etc.): VSS on RA      Pertinent Vitals/Pain Pain Assessment Pain Assessment:  No/denies pain Pain Intervention(s): Monitored during session    Home Living                          Prior Function            PT Goals (current goals can now be found in the care plan section) Acute Rehab PT Goals PT Goal Formulation: With patient Time For Goal Achievement: 08/31/23 Progress towards PT goals: Progressing toward goals    Frequency    Min 3X/week      PT Plan      Co-evaluation              AM-PAC PT "6 Clicks" Mobility   Outcome Measure  Help needed turning from your back to your side while in a flat bed without using bedrails?: A Little Help needed moving from lying on your back to sitting on the side of a flat bed without using bedrails?: A Lot Help needed moving to and from a bed to a chair (including a wheelchair)?: A Little Help needed standing up from a chair using your arms (e.g., wheelchair or bedside chair)?: A Little Help needed to walk in hospital room?: A Little Help needed climbing 3-5 steps with a railing? : Total 6 Click Score: 15    End of Session   Activity Tolerance: Patient tolerated treatment well Patient left: in chair;with call bell/phone within reach Nurse Communication: Mobility status PT Visit Diagnosis: Unsteadiness on feet (R26.81);Other abnormalities of gait and mobility (R26.89);Muscle weakness (generalized) (M62.81)     Time: 0454-0981 PT Time Calculation (min) (ACUTE ONLY): 20 min  Charges:    $Gait Training: 8-22 mins PT General Charges $$ ACUTE PT VISIT: 1 Visit                     Melissa Bray R. PTA Acute Rehabilitation Services Office: [phone]   Melissa Bray 08/27/2023, 12:18 PM

## 2023-08-27 NOTE — Inpatient Diabetes Management (Signed)
 Inpatient Diabetes Program Recommendations  AACE/ADA: New Consensus Statement on Inpatient Glycemic Control (2015)  Target Ranges:  Prepandial:   less than 140 mg/dL      Peak postprandial:   less than 180 mg/dL (1-2 hours)      Critically ill patients:  140 - 180 mg/dL   Lab Results  Component Value Date   GLUCAP 73 08/27/2023   HGBA1C 12.8 (H) 07/31/2023    Latest Reference Range & Units 08/26/23 12:04 08/26/23 16:42 08/26/23 17:07 08/26/23 20:37 08/26/23 23:54 08/27/23 05:59  Glucose-Capillary 70 - 99 mg/dL 098 (H) 49 (L) 76 119 (H) 115 (H) 73  (H): Data is abnormally high (L): Data is abnormally low Review of Glycemic Control  Diabetes history: DM2 Outpatient Diabetes medications: Lantus  20 units daily, Metformin  850 mg BID, Glipizide  10 mg daily Current orders for Inpatient glycemic control: Semglee  30 units BID, Novolog  0-15 units correction scale TID, Novolog  0-5 units HS scale, Novolog  5 units TID with meals  Inpatient Diabetes Program Recommendations:   Noted that patient had a low CBG of 49 mg/dl last evening.  Recommend decreasing Semglee  to 25 units BID, continuing Novolog  0-15 units correction scale TID, Novolog  0-5 units HS scale, and decreasing Novolog  meal coverage to 3 units TID if eating at least 50% of meal.  Nick Barman RN BSN CDE Diabetes Coordinator Pager: [phone]  8am-5pm

## 2023-08-27 NOTE — Consult Note (Addendum)
 WOC Nurse Consult Note: Reason for Consult: Requested to assess a dehiscence on her chest. Wound type: Dehiscense after cardiac surgery (sternal incision). Pressure Injury POA: NA Measurement:6 cm x 0.5 cm x 0.2 cm Wound bed: 100% yellow slough. Drainage (amount, consistency, odor) Minimum, serosanguinous, no odor. Periwound: intact. Dressing procedure/placement/frequency: Cleanse with Vashe (#[id number]). Apply on the wound bed a thin strip of Aquacel (#[id number]), using a swab to help fill it. Cover with foam dressing, change daily or PRN.  WOC team will not plan to follow further.  Please reconsult if further assistance is needed. Thank-you,  Rachel Budds BSN, RN, [employer], WOC  (Pager: [phone])

## 2023-08-28 DIAGNOSIS — I340 Nonrheumatic mitral (valve) insufficiency: Secondary | ICD-10-CM | POA: Diagnosis not present

## 2023-08-28 LAB — PROTIME-INR
INR: 3.9 — ABNORMAL HIGH (ref 0.8–1.2)
Prothrombin Time: 38.5 s — ABNORMAL HIGH (ref 11.4–15.2)

## 2023-08-28 LAB — BASIC METABOLIC PANEL WITH GFR
Anion gap: 7 (ref 5–15)
Anion gap: 12 (ref 5–15)
BUN: 11 mg/dL (ref 8–23)
BUN: 12 mg/dL (ref 8–23)
CO2: 25 mmol/L (ref 22–32)
CO2: 22 mmol/L (ref 22–32)
Calcium: 8.9 mg/dL (ref 8.9–10.3)
Calcium: 9.5 mg/dL (ref 8.9–10.3)
Chloride: 105 mmol/L (ref 98–111)
Chloride: 104 mmol/L (ref 98–111)
Creatinine, Ser: 0.96 mg/dL (ref 0.44–1.00)
Creatinine, Ser: 1.11 mg/dL — ABNORMAL HIGH (ref 0.44–1.00)
GFR, Estimated: >60 mL/min (ref >60)
GFR, Estimated: 56 mL/min — ABNORMAL LOW (ref >60)
Glucose, Bld: 144 mg/dL — ABNORMAL HIGH (ref 70–99)
Glucose, Bld: 221 mg/dL — ABNORMAL HIGH (ref 70–99)
Potassium: 5.1 mmol/L (ref 3.5–5.1)
Potassium: 4.1 mmol/L (ref 3.5–5.1)
Sodium: 137 mmol/L (ref 135–145)
Sodium: 138 mmol/L (ref 135–145)

## 2023-08-28 LAB — MAGNESIUM: Magnesium: 1.8 mg/dL (ref 1.7–2.4)

## 2023-08-28 LAB — COOXEMETRY PANEL
Carboxyhemoglobin: 2.8 % — ABNORMAL HIGH (ref 0.5–1.5)
Methemoglobin: 1.2 % (ref 0.0–1.5)
O2 Saturation: 65.7 %
Total hemoglobin: 9.7 g/dL — ABNORMAL LOW (ref 12.0–16.0)

## 2023-08-28 LAB — GLUCOSE, CAPILLARY
Glucose-Capillary: 103 mg/dL — ABNORMAL HIGH (ref 70–99)
Glucose-Capillary: 148 mg/dL — ABNORMAL HIGH (ref 70–99)
Glucose-Capillary: 95 mg/dL (ref 70–99)
Glucose-Capillary: 108 mg/dL — ABNORMAL HIGH (ref 70–99)
Glucose-Capillary: 142 mg/dL — ABNORMAL HIGH (ref 70–99)

## 2023-08-28 LAB — CBC
HCT: 28.6 % — ABNORMAL LOW (ref 36.0–46.0)
Hemoglobin: 9 g/dL — ABNORMAL LOW (ref 12.0–15.0)
MCH: 28.7 pg (ref 26.0–34.0)
MCHC: 31.5 g/dL (ref 30.0–36.0)
MCV: 91.1 fL (ref 80.0–100.0)
Platelets: 429 10*3/uL — ABNORMAL HIGH (ref 150–400)
RBC: 3.14 MIL/uL — ABNORMAL LOW (ref 3.87–5.11)
RDW: 14.6 % (ref 11.5–15.5)
WBC: 6.7 10*3/uL (ref 4.0–10.5)
nRBC: 0 % (ref 0.0–0.2)

## 2023-08-28 MED ORDER — WARFARIN SODIUM 5 MG PO TABS
6.0000 mg | ORAL_TABLET | Freq: Every day | ORAL | Status: DC
  Administered 2023-08-28: 6 mg via ORAL
  Filled 2023-08-28: qty 1

## 2023-08-28 MED ORDER — SPIRONOLACTONE 12.5 MG HALF TABLET
12.5000 mg | ORAL_TABLET | Freq: Every day | ORAL | Status: DC
  Administered 2023-08-29: 12.5 mg via ORAL
  Filled 2023-08-28: qty 1

## 2023-08-28 MED ORDER — WARFARIN SODIUM 2 MG PO TABS: 2.0000 mg | ORAL_TABLET | Freq: Once | ORAL | Status: DC

## 2023-08-28 MED ORDER — LOSARTAN POTASSIUM 25 MG PO TABS: 12.5000 mg | ORAL_TABLET | Freq: Once | ORAL | Status: DC

## 2023-08-28 MED ORDER — LOSARTAN POTASSIUM 25 MG PO TABS: 25.0000 mg | ORAL_TABLET | Freq: Every day | ORAL | Status: DC

## 2023-08-28 MED ORDER — SACUBITRIL-VALSARTAN 24-26 MG PO TABS: 1.0000 | ORAL_TABLET | Freq: Two times a day (BID) | ORAL | Status: DC

## 2023-08-28 MED ORDER — MAGNESIUM SULFATE 2 GM/50ML IV SOLN
2.0000 g | Freq: Once | INTRAVENOUS | Status: AC
  Administered 2023-08-28: 2 g via INTRAVENOUS
  Filled 2023-08-28: qty 50

## 2023-08-28 MED ORDER — SACUBITRIL-VALSARTAN 24-26 MG PO TABS
1.0000 | ORAL_TABLET | Freq: Two times a day (BID) | ORAL | Status: DC
  Administered 2023-08-28 (×2): 1 via ORAL
  Filled 2023-08-28 (×2): qty 1

## 2023-08-28 NOTE — Progress Notes (Signed)
 Inpatient Rehab Admissions Coordinator:   CIR following. Continue to await insurance approval for CIR.  Wandalee Gust, MS, CCC-SLP Rehab Admissions Coordinator  [phone] (celll) [phone] (office)

## 2023-08-28 NOTE — Progress Notes (Signed)
 PHARMACY - ANTICOAGULATION CONSULT NOTE  Pharmacy Consult for warfarin Indication: onX mMVR  No Known Allergies  Patient Measurements: Height: 5\' 5"  (165.1 cm) Weight: 116.3 kg (256 lb 6.4 oz) IBW/kg (Calculated) : 57 HEPARIN  DW (KG): 84.3  Vital Signs: Temp: 98.8 F (37.1 C) (05/29 0838) Temp Source: Oral (05/29 0838) BP: 137/64 (05/29 0838) Pulse Rate: 85 (05/29 0838)  Labs: Recent Labs    08/26/23 0550 08/26/23 0857 08/27/23 0440 08/28/23 0430  HGB 9.2*  --  9.4* 9.0*  HCT 28.6*  --  30.2* 28.6*  PLT 429*  --  470* 429*  LABPROT 47.3*  --  39.3* 38.5*  INR 5.1*  --  4.0* 3.9*  CREATININE 1.04* 1.07* 0.99  --     Estimated Creatinine Clearance: 73.1 mL/min (by C-G formula based on SCr of 0.99 mg/dL).   Medical History: Past Medical History:  Diagnosis Date   Anxiety    Breast cancer (HCC)    Diabetes mellitus without complication (HCC)    Hyperlipidemia    Hypertension    Type 2 diabetes mellitus without retinopathy (HCC) 06/17/2017    Medications:  Scheduled:   amiodarone   200 mg Oral Daily   anastrozole   1 mg Oral Daily   carvedilol  3.125 mg Oral BID WC   Chlorhexidine  Gluconate Cloth  6 each Topical Daily   [facility], Patient & Family Education   Does not apply Once   dapagliflozin propanediol  10 mg Oral Daily   feeding supplement  237 mL Oral BID BM   fiber supplement (BANATROL TF)  60 mL Oral QID   insulin  aspart  0-15 Units Subcutaneous TID WC   insulin  aspart  0-5 Units Subcutaneous QHS   insulin  aspart  3 Units Subcutaneous TID WC   insulin  glargine-yfgn  25 Units Subcutaneous BID   pantoprazole   40 mg Oral Daily   sacubitril-valsartan  1 tablet Oral BID   sodium chloride  flush  10-40 mL Intracatheter Q12H   sodium chloride  flush  3 mL Intravenous Q12H   spironolactone   12.5 mg Oral Daily   torsemide  20 mg Oral Daily   warfarin  6 mg Oral Daily   Warfarin - Pharmacist Dosing Inpatient   Does not apply q1600     Assessment: 64 yof presenting with acute mitral valve endocarditis with perforated mitral leaflet and severe MR. Underwent dental extraction 5/8. Presented for pre-op RHC and developed flash pulmonary edema requiring intubation - IABP was placed in lab and IV heparin  started. No AC PTA.  Pt now s/p cardiac surgery with onX mMVR placed. Pharmacy consulted to start warfarin 5/16 after IABP removal 5/15. S/p DCCV 5/23 and maintaining NSR. Heparin  bridge dc'd once INR > 2.  5/29: -INR down to 3.9, remains supratherapeutic but trending down -Warfarin has been held since 5/25 with INR jump to 5.1 -Continue ensure bid   Goal of Therapy:  INR 2.5-3.5 Monitor platelets by anticoagulation protocol: Yes   Plan:  Warfarin 6 mg ordered by TCTS and given this morning Daily INR and CBC  Cecillia Cogan, PharmD Clinical Pharmacist 08/28/2023  10:31 AM

## 2023-08-28 NOTE — Progress Notes (Signed)
 PT Cancellation Note  Patient Details Name: Melissa Bray MRN: [id number] DOB: [birth year]   Cancelled Treatment:    Reason Eval/Treat Not Completed: (P) Fatigue/lethargy limiting ability to participate, pt sleeping on arrival, waking briefly and declining mobility this afternoon due to fatigue. Will check back as schedule allows to continue with PT POC.   Beverly Buckler. PTA Acute Rehabilitation Services Office: [phone]    Agapito Horseman 08/28/2023, 3:07 PM

## 2023-08-28 NOTE — Progress Notes (Addendum)
      [address]       [place] [address]             [phone]      6 Days Post-Op Procedure(s) (LRB): TRANSESOPHAGEAL ECHOCARDIOGRAM (N/A) CARDIOVERSION (N/A)  Subjective:  Patient sitting up in chair.  Waiting disposition planning, hopefully insurance authorization will come today.  + ambulation   + BM  Objective: Vital signs in last 24 hours: Temp:  [98.4 F (36.9 C)-98.9 F (37.2 C)] 98.7 F (37.1 C) (05/29 0401) Pulse Rate:  [80-90] 90 (05/29 0616) Cardiac Rhythm: Normal sinus rhythm (05/28 2020) Resp:  [17-20] 18 (05/29 0401) BP: (122-172)/(62-107) 172/107 (05/29 0616) SpO2:  [95 %-100 %] 100 % (05/29 0401) Weight:  [116.3 kg] 116.3 kg (05/29 0401)  Hemodynamic parameters for last 24 hours: CVP:  [3 mmHg-64 mmHg] 64 mmHg  Intake/Output from previous day: 05/28 0701 - 05/29 0700 In: 1183.4 [P.O.:720; I.V.:13; IV Piggyback:450.4] Out: 200 [Urine:200]  General appearance: alert, cooperative, and no distress Heart: regular rate and rhythm Lungs: clear to auscultation bilaterally Abdomen: soft, non-tender; bowel sounds normal; no masses,  no organomegaly Extremities: edema trace Wound: clean and dry  Lab Results: Recent Labs    08/27/23 0440 08/28/23 0430  WBC 7.4 6.7  HGB 9.4* 9.0*  HCT 30.2* 28.6*  PLT 470* 429*   BMET:  Recent Labs    08/26/23 0857 08/27/23 0440  NA 138 138  K 4.5 4.0  CL 104 108  CO2 25 24  GLUCOSE 232* 75  BUN 16 14  CREATININE 1.07* 0.99  CALCIUM  9.4 8.9    PT/INR:  Recent Labs    08/28/23 0430  LABPROT 38.5*  INR 3.9*   ABG    Component Value Date/Time   PHART 7.465 (H) 08/14/2023 1700   HCO3 25.0 08/14/2023 1700   TCO2 26 08/14/2023 1700   ACIDBASEDEF 1.0 08/08/2023 1634   O2SAT 65.7 08/28/2023 0530   CBG (last 3)  Recent Labs    08/27/23 1700 08/27/23 2104 08/28/23 0555  GLUCAP 102* 120* 103*    Assessment/Plan: S/P Procedure(s) (LRB): TRANSESOPHAGEAL ECHOCARDIOGRAM  (N/A) CARDIOVERSION (N/A)  Cardiogenic shock: Valvular shock from severe MR due to vegetation, leaflet perforation - S/P Mechancial MVR-- Hypertensive overnight.[name] GDMT is being optimized.. currently on Coreg 3.125, Cozaar  at 12.5 mg, will increase to 25 mg, Farxiga INR-supratherapeutic, coumadin  has been held for several days, INR now at 3.9, will resume coumadin  at 6 mg today Pulm- no acute issues, off oxygen, continue IS Renal- creatinine, diuresing well on Spironolactone , Demadex Endocarditis- continue PCN G as ordered  Deconditioning- CIR insurance auth remains pending Dispo- patient stable, resume coumadin  today, increase cozaar .. for d/c to CIR once insurance authorization is obtained   LOS: 26 days    Gates Kasal, PA-C 08/28/2023  Patient seen and examined, agree with above Wound with superficial dehiscence- stable. No sign of infection Ready for CIR  Landon Pinion C. Luna Salinas, MD [facility] [phone]

## 2023-08-28 NOTE — Plan of Care (Signed)
 Problem: Education: Goal: Knowledge of General Education information will improve Description: Including pain rating scale, medication(s)/side effects and non-pharmacologic comfort measures Outcome: Progressing   Problem: Health Behavior/Discharge Planning: Goal: Ability to manage health-related needs will improve Outcome: Progressing   Problem: Clinical Measurements: Goal: Ability to maintain clinical measurements within normal limits will improve Outcome: Progressing Goal: Will remain free from infection Outcome: Progressing Goal: Diagnostic test results will improve Outcome: Progressing Goal: Respiratory complications will improve Outcome: Progressing Goal: Cardiovascular complication will be avoided Outcome: Progressing   Problem: Activity: Goal: Risk for activity intolerance will decrease Outcome: Progressing   Problem: Nutrition: Goal: Adequate nutrition will be maintained Outcome: Progressing   Problem: Coping: Goal: Level of anxiety will decrease Outcome: Progressing   Problem: Elimination: Goal: Will not experience complications related to bowel motility Outcome: Progressing Goal: Will not experience complications related to urinary retention Outcome: Progressing   Problem: Pain Managment: Goal: General experience of comfort will improve and/or be controlled Outcome: Progressing   Problem: Safety: Goal: Ability to remain free from injury will improve Outcome: Progressing   Problem: Skin Integrity: Goal: Risk for impaired skin integrity will decrease Outcome: Progressing   Problem: Education: Goal: Ability to describe self-care measures that may prevent or decrease complications (Diabetes Survival Skills Education) will improve Outcome: Progressing Goal: Individualized Educational Video(s) Outcome: Progressing   Problem: Coping: Goal: Ability to adjust to condition or change in health will improve Outcome: Progressing   Problem: Fluid  Volume: Goal: Ability to maintain a balanced intake and output will improve Outcome: Progressing   Problem: Health Behavior/Discharge Planning: Goal: Ability to identify and utilize available resources and services will improve Outcome: Progressing Goal: Ability to manage health-related needs will improve Outcome: Progressing   Problem: Metabolic: Goal: Ability to maintain appropriate glucose levels will improve Outcome: Progressing   Problem: Nutritional: Goal: Maintenance of adequate nutrition will improve Outcome: Progressing Goal: Progress toward achieving an optimal weight will improve Outcome: Progressing   Problem: Skin Integrity: Goal: Risk for impaired skin integrity will decrease Outcome: Progressing   Problem: Tissue Perfusion: Goal: Adequacy of tissue perfusion will improve Outcome: Progressing   Problem: Fluid Volume: Goal: Hemodynamic stability will improve Outcome: Progressing   Problem: Clinical Measurements: Goal: Signs and symptoms of infection will decrease Outcome: Progressing   Problem: Clinical Measurements: Goal: Ability to avoid or minimize complications of infection will improve Outcome: Progressing   Problem: Skin Integrity: Goal: Skin integrity will improve Outcome: Progressing   Problem: Education: Goal: Understanding of CV disease, CV risk reduction, and recovery process will improve Outcome: Progressing Goal: Individualized Educational Video(s) Outcome: Progressing   Problem: Activity: Goal: Ability to return to baseline activity level will improve Outcome: Progressing   Problem: Cardiovascular: Goal: Ability to achieve and maintain adequate cardiovascular perfusion will improve Outcome: Progressing Goal: Vascular access site(s) Level 0-1 will be maintained Outcome: Progressing   Problem: Health Behavior/Discharge Planning: Goal: Ability to safely manage health-related needs after discharge will improve Outcome: Progressing    Problem: Activity: Goal: Ability to tolerate increased activity will improve Outcome: Progressing   Problem: Respiratory: Goal: Ability to maintain a clear airway and adequate ventilation will improve Outcome: Progressing   Problem: Role Relationship: Goal: Method of communication will improve Outcome: Progressing   Problem: Cardiac: Goal: Ability to achieve and maintain adequate cardiopulmonary perfusion will improve Outcome: Progressing Goal: Vascular access site(s) Level 0-1 will be maintained Outcome: Progressing   Problem: Fluid Volume: Goal: Ability to achieve a balanced intake and output will improve Outcome: Progressing  Problem: Physical Regulation: Goal: Complications related to the disease process, condition or treatment will be avoided or minimized Outcome: Progressing

## 2023-08-28 NOTE — Progress Notes (Signed)
   Notified by RN that patient's BP has been elevated for several hours and slowly going up. Most recent BP 172/107. Heart rates in the 80s to 90s. She is asymptomatic. OK to go ahead and given morning medications (Losartan , Coreg, and Spironolactone ) early. BMET was not drawn with morning labs so will add this on. Further med changes per rounding team.  Sharelle Burditt E Hong Moring, PA-C 08/28/2023 6:14 AM

## 2023-08-28 NOTE — Progress Notes (Signed)
 Patient ID: [name], female   DOB: [birth year], 63 y.o.   MRN: [id number]                                                                                               Advanced Heart Failure Rounding Note   Chief Complaint: CHF  Subjective:    63 y/o obese woman admitted with cardiogenic shock in setting of severe MR due to endocarditis and perforation of anterior MV leaflet. Underwent On-X mechanical MVR on 08/14/23. Post-op course c/b RV Failure  POD #15 s/p MVR  Creatinine WNL. K 5.1. CVP 3  S/p DCCV to NSR 5/23, she remains in NSR now on po amiodarone .  INR 3.9. Had been holding warfarin since 5/26 .  Feels better this morning. Resting comfortably in bed.   Objective:   Echo (5/24): EF 55-60%, mildly D-shaped ventricle with mild RV dysfunction and mild RV enlargement, IVC dilated, On-X mechanical MV functioning normally.   Weight Range: 116.3 kg Body mass index is 42.67 kg/m.   Vital Signs:   Temp:  [98.4 F (36.9 C)-98.9 F (37.2 C)] 98.7 F (37.1 C) (05/29 0401) Pulse Rate:  [80-90] 90 (05/29 0616) Resp:  [17-20] 18 (05/29 0401) BP: (122-172)/(62-107) 172/107 (05/29 0616) SpO2:  [95 %-100 %] 100 % (05/29 0401) Weight:  [116.3 kg] 116.3 kg (05/29 0401) Last BM Date : 08/27/23  Weight change: Filed Weights   08/26/23 0645 08/27/23 0407 08/28/23 0401  Weight: 114.6 kg 115.5 kg 116.3 kg   Intake/Output:  Intake/Output Summary (Last 24 hours) at 08/28/2023 0831 Last data filed at 08/28/2023 0256 Gross per 24 hour  Intake 1183.36 ml  Output 200 ml  Net 983.36 ml    Physical Exam  General: well appearing.  No respiratory difficulty Neck: supple. JVD flat.  Cor: PMI nondisplaced. Regular rate & rhythm. No rubs, gallops or murmurs. + midsternal dressing Lungs: clear Extremities: no cyanosis, clubbing, rash, edema. PICC RUE. LUE edema.  Neuro: alert & oriented x 3. Moves all 4 extremities w/o difficulty. Affect pleasant.   Telemetry   NSR 80s (Personally  reviewed)    Labs    CBC Recent Labs    08/27/23 0440 08/28/23 0430  WBC 7.4 6.7  HGB 9.4* 9.0*  HCT 30.2* 28.6*  MCV 89.9 91.1  PLT 470* 429*   Basic Metabolic Panel Recent Labs    32/44/01 0857 08/27/23 0440 08/28/23 0430  NA 138 138  --   K 4.5 4.0  --   CL 104 108  --   CO2 25 24  --   GLUCOSE 232* 75  --   BUN 16 14  --   CREATININE 1.07* 0.99  --   CALCIUM  9.4 8.9  --   MG  --  2.1 1.8   Imaging   No results found.   Medications:    Scheduled Medications:  amiodarone   200 mg Oral Daily   anastrozole   1 mg Oral Daily   carvedilol   3.125 mg Oral BID WC   Chlorhexidine  Gluconate Cloth  6 each Topical Daily   [facility], Patient &  Family Education   Does not apply Once   dapagliflozin propanediol  10 mg Oral Daily   feeding supplement  237 mL Oral BID BM   fiber supplement (BANATROL TF)  60 mL Oral QID   insulin  aspart  0-15 Units Subcutaneous TID WC   insulin  aspart  0-5 Units Subcutaneous QHS   insulin  aspart  3 Units Subcutaneous TID WC   insulin  glargine-yfgn  25 Units Subcutaneous BID   [START ON 08/29/2023] losartan   25 mg Oral Daily   pantoprazole   40 mg Oral Daily   sodium chloride  flush  10-40 mL Intracatheter Q12H   sodium chloride  flush  3 mL Intravenous Q12H   spironolactone   12.5 mg Oral Daily   torsemide  20 mg Oral Daily   warfarin  6 mg Oral Daily   Warfarin - Pharmacist Dosing Inpatient   Does not apply q1600   Infusions:  magnesium  sulfate bolus IVPB     penicillin  G potassium 12 Million Units in sodium chloride  0.9 % 500 mL CONTINUOUS infusion 12 Million Units (08/27/23 2157)   PRN Medications: clonazePAM , dextrose , Gerhardt's butt cream, melatonin, ondansetron  (ZOFRAN ) IV, mouth rinse, oxyCODONE , senna-docusate, sodium chloride  flush, sodium chloride  flush, traMADol   Assessment/Plan  1. Cardiogenic shock: Valvular shock from severe MR.  TEE on 5/5 showed LV EF 65%, normal RV function, severe MR with perforated  leaflet and mobile vegetation.  Patient developed flash pulmonary edema 5/9 in setting of marked hypertension -> IABP placed. - s/p mMVR 08/14/23 - Echo 5/24: EF 55-60%, mildly D-shaped ventricle with mild RV dysfunction and mild RV enlargement, IVC dilated, On-X mechanical MV functioning normally.  - CVP 3 today. Continue Torsemide 20 mg daily - Continue Farxiga 10 mg daily today - Continue coreg 3.125 mg BID - Continue spiro 12.5 mg daily.  - Hypertensive today. Stop losartan , switch to Entresto 24-26 mg BID - Milrinone  stopped 5/22, stable.    2. Mitral regurgitation: Severe on TEE with perforated anterior leaflet and mitral valve vegetation.  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  - s/p On-X mechanical MVR with Dr. Luna Salinas 08/14/23 - post-op antibiotics per ID. On PenG, will require 6 weeks IV abx (start date 05/14). - Now on warfarin, goal INR 2.5 -3.5. Also on ASA 81. Holding warfarin since 5/26 with elevated INR.    3. Acute hypercarbic/hypoxemic respiratory failure in setting of #1&2 - Resolved - Encouraged IS   4. Atrial flutter with RVR - S/p DCCV 5/23. Maintaining NSR  - Transitioned to po amiodarone .  - Continue to hold warfarin with INR at 3.9 today. (Held since 5/26)  5. Morbid obesity - Body mass index is 42.67 kg/m.  6. Hypervolemic Hyponatremia - resolved w/ diuresis.    7. Anemia - hgb 9 today.  - labs c/w IDA but given acute infection will hold off on IV Fe   8. Hyperkalemia - K 5.1 today. False elevation from PCN G infusing through PICC where blood was collected from.  - Check peripheral stick again today. K from PICC not reliable.   9. Hypertension - Meds as above - SBP 170s this morning  Continue to mobilize. Awaiting CIR insurance approval.   Length of Stay: [address], NP  08/28/2023, 8:31 AM  Advanced Heart Failure Team Pager [phone] (M-F; 7a - 5p)  Please contact [facility] for night-coverage after hours (5p  -7a ) and weekends on [email or web address]

## 2023-08-28 NOTE — TOC Transition Note (Signed)
 Transition of Care [facility]) - Discharge Note   Patient Details  Name: Melissa Bray MRN: [id number] Date of Birth: 12-30-1960  [facility]) CM/SW Contact:  Benjiman Bras, RN Phone Number: [phone] 08/28/2023, 12:51 PM   Clinical Narrative:     Plan is for CIR for rehab. Pending insurance auth. [name] Chandler rep, [employer] rep following for home IV abx once dc from CIR.   Final next level of care: IP Rehab Facility Barriers to Discharge: No Barriers Identified   Patient Goals and CMS Choice Patient states their goals for this hospitalization and ongoing recovery are:: wants to get back to her baseline CMS Medicare.gov Compare Post Acute Care list provided to:: Patient Choice offered to / list presented to : Patient      Discharge Placement                       Discharge Plan and Services Additional resources added to the After Visit Summary for     Discharge Planning Services: CM Consult Post Acute Care Choice: IP Rehab                      [facility] Agency: [employer] Date HH Agency Contacted: 08/18/23 Time HH Agency Contacted: [phone] Representative spoke with at [facility] Agency: Kay Parson RN, [employer] rep  Social Drivers of Health (SDOH) Interventions SDOH Screenings   Food Insecurity: No Food Insecurity (07/31/2023)  Housing: Low Risk  (07/31/2023)  Transportation Needs: No Transportation Needs (07/31/2023)  Utilities: Not At Risk (07/31/2023)  Depression (PHQ2-9): Low Risk  (06/27/2023)  Tobacco Use: Low Risk  (08/22/2023)     Readmission Risk Interventions     No data to display

## 2023-08-28 NOTE — H&P (Signed)
 Physical Medicine and Rehabilitation Admission H&P    Chief Complaint  Patient presents with   Fatigue  : HPI: Melissa Bray is a 63 year old right-handed female with history significant for type 2 diabetes mellitus, essential hypertension, hyperlipidemia, breast cancer status post left mastectomy 2016, class III morbid obesity BMI 42.67.  Per chart review patient lives with son.  1 level home 2 steps to enter.  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  Presented 07/31/2023 to [facility] for suspected sepsis due to left upper extremity cellulitis.  She was found to have significant edema to left upper extremity was treated for sepsis/cellulitis when a murmur was noted.  She was transferred to [facility] and a TEE was performed on 5/5 which demonstrated severe mitral regurgitation due to endocarditis and ejection fraction of 55 to 60%.  Patient also had extraction of teeth 5/8.  The following day she deteriorated went to cardiogenic shock.  She was intubated and intra-aortic balloon pump was placed and ultimately underwent mitral valve replacement 08/12/2023 per Dr. Luna Salinas and placed on Coumadin  therapy.  She underwent TEE and cardioversion on 08/22/2023.  Sternal precautions as indicated.  Both cardiothoracic surgery and heart failure team continue to follow.  She was placed on penicillin  G per infectious disease recommendations of 6 weeks initiated 08/13/2023 through 09/24/2023.  Evaluations completed due to patient's debility and multimedical issues was admitted for a comprehensive rehab program.  Review of Systems  Constitutional:  Negative for chills and fever.  HENT:  Negative for hearing loss.   Eyes:  Negative for double vision.  Respiratory:  Positive for cough and shortness of breath.   Cardiovascular:  Positive for leg swelling. Negative for chest pain and palpitations.  Gastrointestinal:  Positive for constipation. Negative for heartburn, nausea and vomiting.   Genitourinary:  Negative for dysuria, flank pain and hematuria.  Musculoskeletal:  Positive for joint pain and myalgias.  Skin:  Negative for rash.  Neurological:  Positive for weakness.  Psychiatric/Behavioral:         Anxiety  All other systems reviewed and are negative.  Past Medical History:  Diagnosis Date   Anxiety    Breast cancer (HCC)    Diabetes mellitus without complication (HCC)    Hyperlipidemia    Hypertension    Type 2 diabetes mellitus without retinopathy (HCC) 06/17/2017   Past Surgical History:  Procedure Laterality Date   ABDOMINAL HYSTERECTOMY     BREAST BIOPSY     CARDIOVERSION N/A 08/22/2023   Procedure: CARDIOVERSION;  Surgeon: Lauralee Poll, MD;  [facility] INVASIVE CV LAB;  Service: Cardiovascular;  Laterality: N/A;   CLIPPING OF ATRIAL APPENDAGE  08/13/2023   Procedure: CLIPPING, LEFT ATRIAL APPENDAGE USING ATRICURE LAA EXCLUSION SYSTEM SIZE 40;  Surgeon: [name], MD;  Location: [facility] OR;  Service: Open Heart Surgery;;   IABP INSERTION N/A 08/08/2023   Procedure: IABP Insertion;  Surgeon: Swaziland, Peter M, MD;  Location: [facility] INVAS[facility] CV LAB;  Service: Cardiovascular;  Laterality: N/A;   INTRAOPERATIVE TRANSESOPHAGEAL ECHOCARDIOGRAM N/A 08/13/2023   Procedure: ECHOCARDIOGRAM, TRANSESOPHAGEAL, INTRAOPERATIVE;  Surgeon: [name], MD;  Location: [facility] OR;  Service: Open Heart Surgery;  Laterality: N/A;   MASTECTOMY Left 2016   MITRAL VALVE REPLACEMENT N/A 08/13/2023   Procedure: MITRAL VALVE REPLACEMENT USING ON-X PROSTHETIC MITRAL HEART VALVE SIZE 31/33MM;  Surgeon: [name], MD;  Location: [facility] OR;  Service: Open Heart Surgery;  Laterality: N/A;   RIGHT/LEFT HEART CATH AND  CORONARY ANGIOGRAPHY N/A 08/08/2023   Procedure: RIGHT/LEFT HEART CATH AND CORONARY ANGIOGRAPHY;  Surgeon: Swaziland, Peter M, MD;  Location: [facility] INVASIVE CV LAB;  Service: Cardiovascular;  Laterality: N/A;   TOOTH EXTRACTION N/A 08/07/2023   Procedure: EXTRACTION  TEETH NUMBERS ELEVEN, THIRTEEN, AND FOURTEEN;  Surgeon: Ascencion Lava, DMD;  Location: [facility] OR;  Service: Oral Surgery;  Laterality: N/A;   TRANSESOPHAGEAL ECHOCARDIOGRAM (CATH LAB) N/A 08/04/2023   Procedure: TRANSESOPHAGEAL ECHOCARDIOGRAM;  Surgeon: Hazle Lites, MD;  [facility]: [facility];  Service: Cardiovascular;  Laterality: N/A;   TRANSESOPHAGEAL ECHOCARDIOGRAM (CATH LAB) N/A 08/22/2023   Procedure: TRANSESOPHAGEAL ECHOCARDIOGRAM;  Surgeon: Lauralee Poll, MD;  Location: [facility] INVASIVE CV LAB;  Service: Cardiovascular;  Laterality: N/A;   Family History  Problem Relation Age of Onset   Asthma Mother    Cancer Father        prostate cancer   Diabetes Father    Hypertension Father    Cancer Brother        prostate cancer   Asthma Brother    Colon cancer Neg Hx    Colon polyps Neg Hx    Crohn's disease Neg Hx    Esophageal cancer Neg Hx    Rectal cancer Neg Hx    Stomach cancer Neg Hx    Ulcerative colitis Neg Hx    Social History:  reports that she has never smoked. She has never been exposed to tobacco smoke. She has never used smokeless tobacco. She reports that she does not drink alcohol and does not use drugs. Allergies: No Known Allergies Medications Prior to Admission  Medication Sig Dispense Refill   alendronate  (FOSAMAX ) 70 MG tablet Take 1 tablet (70 mg total) by mouth once a week. Take with a full glass of water on an empty stomach. 12 tablet 3   amLODipine  (NORVASC ) 10 MG tablet Take 1 tablet (10 mg total) by mouth at bedtime. (Patient taking differently: Take 20 mg by mouth at bedtime.) 90 tablet 1   anastrozole  (ARIMIDEX ) 1 MG tablet Take 1 mg by mouth daily.     cloNIDine  (CATAPRES ) 0.1 MG tablet Take 1 tablet (0.1 mg total) by mouth 2 (two) times daily. 180 tablet 3   glipiZIDE  (GLUCOTROL  XL) 10 MG 24 hr tablet Take 1 tablet (10 mg total) by mouth daily with breakfast. 90 tablet 3   insulin  glargine (LANTUS ) 100 UNIT/ML Solostar Pen Inject 20 Units into the  skin daily. 3 mL 0   lisinopril  (ZESTRIL ) 40 MG tablet Take 1 tablet (40 mg total) by mouth daily. 90 tablet 3   metFORMIN  (GLUCOPHAGE ) 850 MG tablet Take 1 tablet (850 mg total) by mouth 2 (two) times daily with a meal. 60 tablet 0   metoprolol  succinate (TOPROL -XL) 100 MG 24 hr tablet Take 1 tablet (100 mg total) by mouth daily. 90 tablet 3   pravastatin  (PRAVACHOL ) 20 MG tablet Take 1 tablet (20 mg total) by mouth daily. 90 tablet 3   Vitamin D , Ergocalciferol , (DRISDOL ) 1.25 MG (50000 UNIT) CAPS capsule TAKE 1 CAPSULE ONCE A WEEK FOR 12 WEEKS. AFTER 12 WEEKS SWITCH TO OVER THE COUNTER VITAMIN D  2000 IU 12 capsule 0   Continuous Glucose Sensor (FREESTYLE LIBRE 3 PLUS SENSOR) MISC Inject 1 Units into the skin every 14 (fourteen) days. Change sensor every 15 days. 2 each 11   Insulin  Pen Needle (PEN NEEDLES) 31G X 6 MM MISC 1 application  by Does not apply route at bedtime. 90 each 3  Home: Home Living Family/patient expects to be discharged to:: Private residence Living Arrangements: Children Available Help at Discharge: Family, Available 24 hours/day Type of Home: House Home Access: Stairs to enter [employer] of Steps: 2 Entrance Stairs-Rails: None Home Layout: One level Bathroom Shower/Tub: Tub/shower unit, Engineer, building services: Pharmacist, community: Yes Home Equipment: None Additional Comments: 24/7 between son and caretaker  Lives With: Son   Functional History: Prior Function Prior Level of Function : Independent/Modified Independent, Driving Mobility Comments: independent without AD, no falls in past 6 months but reports balance has been off for ~5 days; 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: independent  Functional Status:  Mobility: Bed Mobility Overal bed mobility: Needs Assistance Bed Mobility: Supine to Sit Rolling: Contact guard assist Sidelying to sit: Min assist Supine to sit: Min assist Sit to sidelying: Min assist General bed  mobility comments: OOB in recliner Transfers Overall transfer level: Needs assistance Equipment used: Rolling walker (2 wheels) Transfers: Sit to/from Stand Sit to Stand: Contact guard assist Bed to/from chair/wheelchair/BSC transfer type:: Step pivot Step pivot transfers: Min assist, Contact guard assist General transfer comment: uses momentum, cues for hands on knees or across chest Ambulation/Gait Ambulation/Gait assistance: Contact guard assist Gait Distance (Feet): 240 Feet Assistive device: Rolling walker (2 wheels) Gait Pattern/deviations: Step-through pattern, Decreased stride length General Gait Details: slow, steady gait with RW Gait velocity: decreased Gait velocity interpretation: <1.31 ft/sec, indicative of household ambulator    ADL: ADL Overall ADL's : Needs assistance/impaired Eating/Feeding: Set up, Bed level Grooming: Wash/dry hands, Wash/dry face, Oral care, Set up, Sitting Grooming Details (indicate cue type and reason): at sink Upper Body Bathing: Set up, Sitting Upper Body Bathing Details (indicate cue type and reason): at sink Lower Body Bathing: Moderate assistance, Sit to/from stand Lower Body Bathing Details (indicate cue type and reason): bathed peri area front seated and stood for peri area back Upper Body Dressing : Minimal assistance, Sitting Upper Body Dressing Details (indicate cue type and reason): to change gowns Lower Body Dressing: Moderate assistance, Sit to/from stand Lower Body Dressing Details (indicate cue type and reason): able to doff socks but required assistance to get over toes and patient able to complete while in figure 4 Toilet Transfer: Minimal assistance, BSC/3in1 Toilet Transfer Details (indicate cue type and reason): able to transfer from EOB to [facility] with min assist Toileting- Clothing Manipulation and Hygiene: Sit to/from stand, Minimal assistance Toileting - Clothing Manipulation Details (indicate cue type and reason): able to  perform toilet hygiene while standing with min assist for clothing management Functional mobility during ADLs: Minimal assistance, Rolling walker (2 wheels) General ADL Comments: continues to require momentum and min assist to stand  Cognition: Cognition Orientation Level: Oriented X4 Cognition Arousal: Alert Behavior During Therapy: WFL for tasks assessed/performed  Physical Exam: Blood pressure (!) 156/65, pulse 84, temperature 98.6 F (37 C), temperature source Oral, resp. rate 20, height 5\' 5"  (1.651 m), weight 116.3 kg, SpO2 100%. Physical Exam Constitutional:      General: She is not in acute distress.    Appearance: She is obese. She is not ill-appearing.  HENT:     Head: Normocephalic and atraumatic.     Nose: Nose normal.     Mouth/Throat:     Mouth: Mucous membranes are moist.  Eyes:     Extraocular Movements: Extraocular movements intact.     Conjunctiva/sclera: Conjunctivae normal.     Pupils: Pupils are equal, round, and reactive to light.  Cardiovascular:  Rate and Rhythm: Normal rate and regular rhythm.     Heart sounds: Murmur heard.  Pulmonary:     Effort: Pulmonary effort is normal. No respiratory distress.     Breath sounds: No wheezing, rhonchi or rales.  Abdominal:     General: Bowel sounds are normal. There is no distension.     Palpations: Abdomen is soft.     Tenderness: There is no abdominal tenderness.  Musculoskeletal:     Cervical back: Normal range of motion.     Right lower leg: Edema present.     Left lower leg: Edema present.  Skin:    General: Skin is warm and dry.     Comments: Old left mastectomy scar  Neurological:     Comments: Alert and oriented x 3. Normal insight and awareness. Intact Memory. Normal language and speech. Cranial nerve exam unremarkable. MMT: BUE motor 4+ to 5/5 prox to distal. BLE: 4 HF, KE and 4+ ADF/PF. Did not appreciate any focal sensory loss or abnl tone. DTR's 1+. No ataxia.    Psychiatric:        Mood and  Affect: Mood normal.        Behavior: Behavior normal.     Results for orders placed or performed during the hospital encounter of 07/31/23 (from the past 48 hours)  Cooxemetry Panel (carboxy, met, total hgb, O2 sat)     Status: Abnormal   Collection Time: 08/27/23  5:30 AM  Result Value Ref Range   Total hemoglobin 9.9 (L) 12.0 - 16.0 g/dL   O2 Saturation 16.1 %   Carboxyhemoglobin 1.4 0.5 - 1.5 %   Methemoglobin <0.7 0.0 - 1.5 %    Comment: Performed at [facility], [address], [place], [place] [address]  Glucose, capillary     Status: None   Collection Time: 08/27/23  5:59 AM  Result Value Ref Range   Glucose-Capillary 73 70 - 99 mg/dL    Comment: Glucose reference range applies only to samples taken after fasting for at least 8 hours.   Comment 1 Notify RN    Comment 2 Document in Chart   Glucose, capillary     Status: Abnormal   Collection Time: 08/27/23 11:23 AM  Result Value Ref Range   Glucose-Capillary 225 (H) 70 - 99 mg/dL    Comment: Glucose reference range applies only to samples taken after fasting for at least 8 hours.  Glucose, capillary     Status: Abnormal   Collection Time: 08/27/23  4:21 PM  Result Value Ref Range   Glucose-Capillary 67 (L) 70 - 99 mg/dL    Comment: Glucose reference range applies only to samples taken after fasting for at least 8 hours.  Glucose, capillary     Status: None   Collection Time: 08/27/23  4:41 PM  Result Value Ref Range   Glucose-Capillary 78 70 - 99 mg/dL    Comment: Glucose reference range applies only to samples taken after fasting for at least 8 hours.  Glucose, capillary     Status: Abnormal   Collection Time: 08/27/23  5:00 PM  Result Value Ref Range   Glucose-Capillary 102 (H) 70 - 99 mg/dL    Comment: Glucose reference range applies only to samples taken after fasting for at least 8 hours.  Glucose, capillary     Status: Abnormal   Collection Time: 08/27/23  9:04 PM  Result Value Ref Range    Glucose-Capillary 120 (H) 70 - 99 mg/dL  Comment: Glucose reference range applies only to samples taken after fasting for at least 8 hours.  Protime-INR     Status: Abnormal   Collection Time: 08/28/23  4:30 AM  Result Value Ref Range   Prothrombin Time 38.5 (H) 11.4 - 15.2 seconds   INR 3.9 (H) 0.8 - 1.2    Comment: (NOTE) INR goal varies based on device and disease states. Performed at [facility], [address] [place], [place] [address]   CBC     Status: Abnormal   Collection Time: 08/28/23  4:30 AM  Result Value Ref Range   WBC 6.7 4.0 - 10.5 K/uL   RBC 3.14 (L) 3.87 - 5.11 MIL/uL   Hemoglobin 9.0 (L) 12.0 - 15.0 g/dL   HCT 60.4 (L) 54.0 - 98.1 %   MCV 91.1 80.0 - 100.0 fL   MCH 28.7 26.0 - 34.0 pg   MCHC 31.5 30.0 - 36.0 g/dL   RDW 19.1 47.8 - 29.5 %   Platelets 429 (H) 150 - 400 K/uL   nRBC 0.0 0.0 - 0.2 %    Comment: Performed at [facility], [address], [place], [place] [address]  Magnesium      Status: None   Collection Time: 08/28/23  4:30 AM  Result Value Ref Range   Magnesium  1.8 1.7 - 2.4 mg/dL    Comment: Performed at [facility], [address], [place], [place] [address]  Cooxemetry Panel (carboxy, met, total hgb, O2 sat)     Status: Abnormal   Collection Time: 08/28/23  5:30 AM  Result Value Ref Range   Total hemoglobin 9.7 (L) 12.0 - 16.0 g/dL   O2 Saturation 46.9 %   Carboxyhemoglobin 2.8 (H) 0.5 - 1.5 %   Methemoglobin 1.2 0.0 - 1.5 %    Comment: Performed at [facility], [address] [place], [place] [address]  Glucose, capillary     Status: Abnormal   Collection Time: 08/28/23  5:55 AM  Result Value Ref Range   Glucose-Capillary 103 (H) 70 - 99 mg/dL    Comment: Glucose reference range applies only to samples taken after fasting for at least 8 hours.  Glucose, capillary     Status: Abnormal   Collection Time: 08/28/23  8:43 AM  Result Value Ref Range   Glucose-Capillary 148 (H) 70 - 99 mg/dL    Comment:  Glucose reference range applies only to samples taken after fasting for at least 8 hours.  Basic metabolic panel     Status: Abnormal   Collection Time: 08/28/23 10:05 AM  Result Value Ref Range   Sodium 137 135 - 145 mmol/L   Potassium 5.1 3.5 - 5.1 mmol/L   Chloride 105 98 - 111 mmol/L   CO2 25 22 - 32 mmol/L   Glucose, Bld 144 (H) 70 - 99 mg/dL    Comment: Glucose reference range applies only to samples taken after fasting for at least 8 hours.   BUN 11 8 - 23 mg/dL   Creatinine, Ser 8.41 0.44 - 1.00 mg/dL   Calcium  8.9 8.9 - 10.3 mg/dL   GFR, Estimated >32 >44 mL/min    Comment: (NOTE) Calculated using the CKD-EPI Creatinine Equation (2021)    Anion gap 7 5 - 15    Comment: Performed at [facility], [address], [place], [place] [address]  Glucose, capillary     Status: None   Collection Time: 08/28/23 11:49 AM  Result Value Ref Range   Glucose-Capillary 95 70 - 99 mg/dL    Comment: Glucose reference range applies only to samples taken after fasting for at least 8 hours.  Basic metabolic panel with GFR     Status: Abnormal   Collection Time: 08/28/23 12:53 PM  Result Value Ref Range   Sodium 138 135 - 145 mmol/L   Potassium 4.1 3.5 - 5.1 mmol/L   Chloride 104 98 - 111 mmol/L   CO2 22 22 - 32 mmol/L   Glucose, Bld 221 (H) 70 - 99 mg/dL    Comment: Glucose reference range applies only to samples taken after fasting for at least 8 hours.   BUN 12 8 - 23 mg/dL   Creatinine, Ser 1.61 (H) 0.44 - 1.00 mg/dL   Calcium  9.5 8.9 - 10.3 mg/dL   GFR, Estimated 56 (L) >60 mL/min    Comment: (NOTE) Calculated using the CKD-EPI Creatinine Equation (2021)    Anion gap 12 5 - 15    Comment: Performed at [facility], [address] [place], [place] [address]  Glucose, capillary     Status: Abnormal   Collection Time: 08/28/23  4:32 PM  Result Value Ref Range   Glucose-Capillary 108 (H) 70 - 99 mg/dL    Comment: Glucose reference range applies only to samples taken  after fasting for at least 8 hours.  Glucose, capillary     Status: Abnormal   Collection Time: 08/28/23  9:13 PM  Result Value Ref Range   Glucose-Capillary 142 (H) 70 - 99 mg/dL    Comment: Glucose reference range applies only to samples taken after fasting for at least 8 hours.   Comment 1 Notify RN    Comment 2 Document in Chart   Protime-INR     Status: Abnormal   Collection Time: 08/29/23  4:00 AM  Result Value Ref Range   Prothrombin Time 39.2 (H) 11.4 - 15.2 seconds   INR 4.0 (H) 0.8 - 1.2    Comment: (NOTE) INR goal varies based on device and disease states. Performed at [facility], [address] [place], [place] [address]   Cooxemetry Panel (carboxy, met, total hgb, O2 sat)     Status: Abnormal   Collection Time: 08/29/23  4:00 AM  Result Value Ref Range   Total hemoglobin 10.1 (L) 12.0 - 16.0 g/dL   O2 Saturation 11.9 %   Carboxyhemoglobin 1.8 (H) 0.5 - 1.5 %   Methemoglobin <0.7 0.0 - 1.5 %    Comment: Performed at [facility], [address], [place], [place] [address]  CBC     Status: Abnormal   Collection Time: 08/29/23  4:00 AM  Result Value Ref Range   WBC 6.4 4.0 - 10.5 K/uL   RBC 3.25 (L) 3.87 - 5.11 MIL/uL   Hemoglobin 9.3 (L) 12.0 - 15.0 g/dL   HCT 95.6 (L) 21.3 - 08.6 %   MCV 90.2 80.0 - 100.0 fL   MCH 28.6 26.0 - 34.0 pg   MCHC 31.7 30.0 - 36.0 g/dL   RDW 57.8 46.9 - 62.9 %   Platelets 404 (H) 150 - 400 K/uL   nRBC 0.0 0.0 - 0.2 %    Comment: Performed at [facility], [address], [place], [place] [address]  Magnesium      Status: None   Collection Time: 08/29/23  4:00 AM  Result Value Ref Range   Magnesium  2.3 1.7 - 2.4 mg/dL  Comment: Performed at [facility], [address], [place], [place] [address]  Basic metabolic panel with GFR     Status: None   Collection Time: 08/29/23  4:00 AM  Result Value Ref Range   Sodium 138 135 - 145 mmol/L   Potassium 3.9 3.5 - 5.1 mmol/L   Chloride 105 98 - 111 mmol/L    CO2 26 22 - 32 mmol/L   Glucose, Bld 90 70 - 99 mg/dL    Comment: Glucose reference range applies only to samples taken after fasting for at least 8 hours.   BUN 11 8 - 23 mg/dL   Creatinine, Ser 6.04 0.44 - 1.00 mg/dL   Calcium  8.9 8.9 - 10.3 mg/dL   GFR, Estimated >54 >09 mL/min    Comment: (NOTE) Calculated using the CKD-EPI Creatinine Equation (2021)    Anion gap 7 5 - 15    Comment: Performed at [facility], [address], [place], [place] [address]   No results found.    Blood pressure (!) 156/65, pulse 84, temperature 98.6 F (37 C), temperature source Oral, resp. rate 20, height 5\' 5"  (1.651 m), weight 116.3 kg, SpO2 100%.  Medical Problem List and Plan: 1. Functional deficits secondary to debility after bacterial endocarditis/status post mitral valve replacement 08/12/2023 with multiple associated complications.   -Sternal precautions  -patient may shower  -ELOS/Goals: 7-10 days, mod I to supervision goals.  2.  Antithrombotics: -DVT/anticoagulation:  Pharmaceutical: Coumadin .  INR range 2.5-3.5 -monitor labs daily, pharmacy protocol  -antiplatelet therapy: N/A 3. Pain Management: Oxycodone  as needed, tramadol  4. Mood/Behavior/Sleep: Klonopin  0.5 mg twice daily as needed anxiety, melatonin 5 mg nightly as needed  -antipsychotic agents: N/A 5. Neuropsych/cognition: This patient is capable of making decisions on her own behalf. 6. Skin/Wound Care: Routine skin checks 7. Fluids/Electrolytes/Nutrition: Routine in and outs with follow-up chemistries 8.  Acute blood loss anemia.  Follow-up CBC 9.  ID/bacterial endocarditis.  Penicillin  G initiated 08/13/2023.  Continue through 09/24/2023.   - weekly labs for monitoring -Follow-up with infectious disease as outpt --[facility] 09/29/23 @0930  10.  Hypertension.  Coreg 3.125 mg twice daily, Entresto 24-26 mg twice daily, Aldactone  12.5 mg daily, Demadex 20 mg daily, Farxiga 10 mg daily 11.  Atrial flutter with RVR.  Status  post DCCV 5/23.  Maintaining normal sinus rhythm.  Amiodarone  200 mg daily  -rate well controlled at present 12.  Diabetes mellitus.  Hemoglobin A1c 12.8.  Currently on NovoLog  3 units 3 times daily with meals, Semglee  25 units twice daily.  Prior to admission patient on Glucotrol  XL 10 mg daily, Lantus  insulin  20 units daily.   -reasonable control at present -Resume as needed 13.  History of breast cancer status post left mastectomy 2016.  Continue Arimidex  and follow-up outpatient 14.  Class III morbid obesity.  BMI 42.67.  Dietary follow-up  Sterling Eisenmenger, PA-C 08/29/2023

## 2023-08-28 NOTE — Progress Notes (Addendum)
 PA Sharren Decree, was made aware that pt's BP=172/107. HR=90. Pt was given morning coreg , cozaar , and aldactone  early.

## 2023-08-29 ENCOUNTER — Other Ambulatory Visit: Payer: Self-pay

## 2023-08-29 ENCOUNTER — Encounter (HOSPITAL_COMMUNITY): Payer: Self-pay | Admitting: Physical Medicine & Rehabilitation

## 2023-08-29 ENCOUNTER — Inpatient Hospital Stay (HOSPITAL_COMMUNITY)
Admission: AD | Admit: 2023-08-29 | Discharge: 2023-09-05 | DRG: 289 | Disposition: A | Discharge status: Home Health | Source: Intra-hospital | Attending: Physical Medicine & Rehabilitation | Admitting: Physical Medicine & Rehabilitation

## 2023-08-29 DIAGNOSIS — Z8249 Family history of ischemic heart disease and other diseases of the circulatory system: Secondary | ICD-10-CM | POA: Diagnosis not present

## 2023-08-29 DIAGNOSIS — I10 Essential (primary) hypertension: Secondary | ICD-10-CM | POA: Diagnosis not present

## 2023-08-29 DIAGNOSIS — Z952 Presence of prosthetic heart valve: Secondary | ICD-10-CM

## 2023-08-29 DIAGNOSIS — I059 Rheumatic mitral valve disease, unspecified: Secondary | ICD-10-CM | POA: Diagnosis not present

## 2023-08-29 DIAGNOSIS — I4892 Unspecified atrial flutter: Secondary | ICD-10-CM | POA: Diagnosis present

## 2023-08-29 DIAGNOSIS — E785 Hyperlipidemia, unspecified: Secondary | ICD-10-CM | POA: Diagnosis present

## 2023-08-29 DIAGNOSIS — Z833 Family history of diabetes mellitus: Secondary | ICD-10-CM

## 2023-08-29 DIAGNOSIS — R570 Cardiogenic shock: Secondary | ICD-10-CM | POA: Diagnosis not present

## 2023-08-29 DIAGNOSIS — Z7983 Long term (current) use of bisphosphonates: Secondary | ICD-10-CM | POA: Diagnosis not present

## 2023-08-29 DIAGNOSIS — Z90710 Acquired absence of both cervix and uterus: Secondary | ICD-10-CM | POA: Diagnosis not present

## 2023-08-29 DIAGNOSIS — Z794 Long term (current) use of insulin: Secondary | ICD-10-CM | POA: Diagnosis not present

## 2023-08-29 DIAGNOSIS — Z7985 Long-term (current) use of injectable non-insulin antidiabetic drugs: Secondary | ICD-10-CM

## 2023-08-29 DIAGNOSIS — I340 Nonrheumatic mitral (valve) insufficiency: Secondary | ICD-10-CM | POA: Diagnosis present

## 2023-08-29 DIAGNOSIS — Z7984 Long term (current) use of oral hypoglycemic drugs: Secondary | ICD-10-CM

## 2023-08-29 DIAGNOSIS — E119 Type 2 diabetes mellitus without complications: Secondary | ICD-10-CM | POA: Diagnosis present

## 2023-08-29 DIAGNOSIS — I483 Typical atrial flutter: Secondary | ICD-10-CM | POA: Diagnosis not present

## 2023-08-29 DIAGNOSIS — E1169 Type 2 diabetes mellitus with other specified complication: Secondary | ICD-10-CM

## 2023-08-29 DIAGNOSIS — Z9012 Acquired absence of left breast and nipple: Secondary | ICD-10-CM | POA: Diagnosis not present

## 2023-08-29 DIAGNOSIS — Z79899 Other long term (current) drug therapy: Secondary | ICD-10-CM

## 2023-08-29 DIAGNOSIS — Z825 Family history of asthma and other chronic lower respiratory diseases: Secondary | ICD-10-CM | POA: Diagnosis not present

## 2023-08-29 DIAGNOSIS — R4589 Other symptoms and signs involving emotional state: Secondary | ICD-10-CM

## 2023-08-29 DIAGNOSIS — I330 Acute and subacute infective endocarditis: Secondary | ICD-10-CM | POA: Diagnosis present

## 2023-08-29 DIAGNOSIS — Z6841 Body Mass Index (BMI) 40.0 and over, adult: Secondary | ICD-10-CM

## 2023-08-29 DIAGNOSIS — D62 Acute posthemorrhagic anemia: Secondary | ICD-10-CM | POA: Diagnosis present

## 2023-08-29 DIAGNOSIS — Z7901 Long term (current) use of anticoagulants: Secondary | ICD-10-CM

## 2023-08-29 DIAGNOSIS — Z79811 Long term (current) use of aromatase inhibitors: Secondary | ICD-10-CM

## 2023-08-29 DIAGNOSIS — L89312 Pressure ulcer of right buttock, stage 2: Secondary | ICD-10-CM | POA: Diagnosis not present

## 2023-08-29 DIAGNOSIS — R5381 Other malaise: Principal | ICD-10-CM | POA: Diagnosis present

## 2023-08-29 DIAGNOSIS — Z853 Personal history of malignant neoplasm of breast: Secondary | ICD-10-CM | POA: Diagnosis not present

## 2023-08-29 DIAGNOSIS — B951 Streptococcus, group B, as the cause of diseases classified elsewhere: Secondary | ICD-10-CM | POA: Diagnosis not present

## 2023-08-29 LAB — GLUCOSE, CAPILLARY
Glucose-Capillary: 90 mg/dL (ref 70–99)
Glucose-Capillary: 157 mg/dL — ABNORMAL HIGH (ref 70–99)
Glucose-Capillary: 124 mg/dL — ABNORMAL HIGH (ref 70–99)
Glucose-Capillary: 85 mg/dL (ref 70–99)

## 2023-08-29 LAB — BASIC METABOLIC PANEL WITH GFR
Anion gap: 7 (ref 5–15)
BUN: 11 mg/dL (ref 8–23)
CO2: 26 mmol/L (ref 22–32)
Calcium: 8.9 mg/dL (ref 8.9–10.3)
Chloride: 105 mmol/L (ref 98–111)
Creatinine, Ser: 0.93 mg/dL (ref 0.44–1.00)
GFR, Estimated: >60 mL/min (ref >60)
Glucose, Bld: 90 mg/dL (ref 70–99)
Potassium: 3.9 mmol/L (ref 3.5–5.1)
Sodium: 138 mmol/L (ref 135–145)

## 2023-08-29 LAB — CBC
HCT: 29.3 % — ABNORMAL LOW (ref 36.0–46.0)
Hemoglobin: 9.3 g/dL — ABNORMAL LOW (ref 12.0–15.0)
MCH: 28.6 pg (ref 26.0–34.0)
MCHC: 31.7 g/dL (ref 30.0–36.0)
MCV: 90.2 fL (ref 80.0–100.0)
Platelets: 404 10*3/uL — ABNORMAL HIGH (ref 150–400)
RBC: 3.25 MIL/uL — ABNORMAL LOW (ref 3.87–5.11)
RDW: 14.3 % (ref 11.5–15.5)
WBC: 6.4 10*3/uL (ref 4.0–10.5)
nRBC: 0 % (ref 0.0–0.2)

## 2023-08-29 LAB — COOXEMETRY PANEL
Carboxyhemoglobin: 1.8 % — ABNORMAL HIGH (ref 0.5–1.5)
Methemoglobin: <0.7 % (ref 0.0–1.5)
O2 Saturation: 93.7 %
Total hemoglobin: 10.1 g/dL — ABNORMAL LOW (ref 12.0–16.0)

## 2023-08-29 LAB — PROTIME-INR
INR: 4 — ABNORMAL HIGH (ref 0.8–1.2)
Prothrombin Time: 39.2 s — ABNORMAL HIGH (ref 11.4–15.2)

## 2023-08-29 LAB — MAGNESIUM: Magnesium: 2.3 mg/dL (ref 1.7–2.4)

## 2023-08-29 MED ORDER — GERHARDT'S BUTT CREAM: 1.0000 | TOPICAL_CREAM | Freq: Every day | CUTANEOUS | Status: DC | PRN

## 2023-08-29 MED ORDER — GERHARDT'S BUTT CREAM: TOPICAL_CREAM | Freq: Every day | CUTANEOUS | Status: DC | PRN

## 2023-08-29 MED ORDER — INSULIN GLARGINE-YFGN 100 UNIT/ML ~~LOC~~ SOLN
25.0000 [IU] | Freq: Two times a day (BID) | SUBCUTANEOUS | Status: DC
  Administered 2023-08-30: 25 [IU] via SUBCUTANEOUS
  Filled 2023-08-29 (×3): qty 0.25

## 2023-08-29 MED ORDER — OXYCODONE HCL 5 MG PO TABS
5.0000 mg | ORAL_TABLET | ORAL | Status: DC | PRN
  Administered 2023-08-29: 10 mg via ORAL
  Filled 2023-08-29: qty 2

## 2023-08-29 MED ORDER — SENNOSIDES-DOCUSATE SODIUM 8.6-50 MG PO TABS
1.0000 | ORAL_TABLET | Freq: Every evening | ORAL | Status: DC | PRN
  Administered 2023-09-05: 1 via ORAL
  Filled 2023-08-29: qty 1

## 2023-08-29 MED ORDER — PANTOPRAZOLE SODIUM 40 MG PO TBEC: 40.0000 mg | DELAYED_RELEASE_TABLET | Freq: Every day | ORAL | Status: DC

## 2023-08-29 MED ORDER — MELATONIN 5 MG PO TABS: 5.0000 mg | ORAL_TABLET | Freq: Every evening | ORAL | Status: DC | PRN

## 2023-08-29 MED ORDER — TORSEMIDE 20 MG PO TABS: 20.0000 mg | ORAL_TABLET | Freq: Every day | ORAL | Status: DC

## 2023-08-29 MED ORDER — CARVEDILOL 3.125 MG PO TABS: 3.1250 mg | ORAL_TABLET | Freq: Two times a day (BID) | ORAL | Status: DC

## 2023-08-29 MED ORDER — SPIRONOLACTONE 12.5 MG HALF TABLET: 12.5000 mg | ORAL_TABLET | Freq: Every day | ORAL | Status: DC

## 2023-08-29 MED ORDER — DAPAGLIFLOZIN PROPANEDIOL 10 MG PO TABS: 10.0000 mg | ORAL_TABLET | Freq: Every day | ORAL | Status: DC

## 2023-08-29 MED ORDER — SACUBITRIL-VALSARTAN 49-51 MG PO TABS
1.0000 | ORAL_TABLET | Freq: Two times a day (BID) | ORAL | Status: DC
  Administered 2023-08-29: 1 via ORAL
  Filled 2023-08-29 (×2): qty 1

## 2023-08-29 MED ORDER — PANTOPRAZOLE SODIUM 40 MG PO TBEC
40.0000 mg | DELAYED_RELEASE_TABLET | Freq: Every day | ORAL | Status: DC
  Administered 2023-08-30 – 2023-09-05 (×7): 40 mg via ORAL
  Filled 2023-08-29 (×7): qty 1

## 2023-08-29 MED ORDER — TRAMADOL HCL 50 MG PO TABS
50.0000 mg | ORAL_TABLET | ORAL | Status: DC | PRN
  Administered 2023-09-02: 50 mg via ORAL
  Filled 2023-08-29: qty 1

## 2023-08-29 MED ORDER — SPIRONOLACTONE 25 MG PO TABS: 12.5000 mg | ORAL_TABLET | Freq: Every day | ORAL | Status: DC

## 2023-08-29 MED ORDER — CLONAZEPAM 0.5 MG PO TABS
0.5000 mg | ORAL_TABLET | Freq: Two times a day (BID) | ORAL | Status: DC | PRN
  Administered 2023-08-29 – 2023-09-03 (×5): 0.5 mg via ORAL
  Filled 2023-08-29 (×5): qty 1

## 2023-08-29 MED ORDER — ONDANSETRON HCL 4 MG PO TABS: 4.0000 mg | ORAL_TABLET | Freq: Three times a day (TID) | ORAL | Status: DC | PRN

## 2023-08-29 MED ORDER — SODIUM CHLORIDE 0.9 % IV SOLN: 12.0000 10*6.[IU] | Freq: Two times a day (BID) | INTRAVENOUS | Status: DC

## 2023-08-29 MED ORDER — DAPAGLIFLOZIN PROPANEDIOL 10 MG PO TABS
10.0000 mg | ORAL_TABLET | Freq: Every day | ORAL | Status: DC
  Filled 2023-08-29: qty 1

## 2023-08-29 MED ORDER — ANASTROZOLE 1 MG PO TABS
1.0000 mg | ORAL_TABLET | Freq: Every day | ORAL | Status: DC
  Administered 2023-08-30 – 2023-09-05 (×7): 1 mg via ORAL
  Filled 2023-08-29 (×7): qty 1

## 2023-08-29 MED ORDER — AMIODARONE HCL 200 MG PO TABS: 200.0000 mg | ORAL_TABLET | Freq: Every day | ORAL | Status: DC

## 2023-08-29 MED ORDER — TORSEMIDE 20 MG PO TABS
20.0000 mg | ORAL_TABLET | Freq: Every day | ORAL | Status: DC
  Administered 2023-08-30 – 2023-09-05 (×7): 20 mg via ORAL
  Filled 2023-08-29 (×7): qty 1

## 2023-08-29 MED ORDER — INSULIN ASPART 100 UNIT/ML IJ SOLN
0.0000 [IU] | Freq: Three times a day (TID) | INTRAMUSCULAR | Status: DC
  Administered 2023-08-29 – 2023-08-31 (×3): 2 [IU] via SUBCUTANEOUS
  Administered 2023-08-31: 3 [IU] via SUBCUTANEOUS
  Administered 2023-09-01: 5 [IU] via SUBCUTANEOUS
  Administered 2023-09-02 – 2023-09-03 (×4): 2 [IU] via SUBCUTANEOUS
  Administered 2023-09-03: 3 [IU] via SUBCUTANEOUS
  Administered 2023-09-04 (×2): 2 [IU] via SUBCUTANEOUS

## 2023-08-29 MED ORDER — PANTOPRAZOLE SODIUM 40 MG PO TBEC
40.0000 mg | DELAYED_RELEASE_TABLET | Freq: Every day | ORAL | Status: DC
Start: 2023-08-29 — End: 2023-09-04

## 2023-08-29 MED ORDER — SACUBITRIL-VALSARTAN 49-51 MG PO TABS: 1.0000 | ORAL_TABLET | Freq: Two times a day (BID) | ORAL | Status: DC

## 2023-08-29 MED ORDER — ENSURE ENLIVE PO LIQD
237.0000 mL | Freq: Two times a day (BID) | ORAL | Status: DC
  Administered 2023-08-30 – 2023-09-04 (×7): 237 mL via ORAL
  Filled 2023-08-29 (×14): qty 237

## 2023-08-29 MED ORDER — OXYCODONE HCL 5 MG PO TABS: 5.0000 mg | ORAL_TABLET | Freq: Four times a day (QID) | ORAL | 0 refills | Status: DC | PRN

## 2023-08-29 MED ORDER — SPIRONOLACTONE 12.5 MG HALF TABLET
12.5000 mg | ORAL_TABLET | Freq: Every day | ORAL | Status: DC
  Administered 2023-08-30 – 2023-09-05 (×7): 12.5 mg via ORAL
  Filled 2023-08-29 (×7): qty 1

## 2023-08-29 MED ORDER — CLONAZEPAM 0.5 MG PO TABS: 0.5000 mg | ORAL_TABLET | Freq: Two times a day (BID) | ORAL | Status: DC | PRN

## 2023-08-29 MED ORDER — AMIODARONE HCL 200 MG PO TABS
200.0000 mg | ORAL_TABLET | Freq: Every day | ORAL | Status: DC
  Administered 2023-08-30 – 2023-09-05 (×7): 200 mg via ORAL
  Filled 2023-08-29 (×7): qty 1

## 2023-08-29 MED ORDER — SENNOSIDES-DOCUSATE SODIUM 8.6-50 MG PO TABS: 1.0000 | ORAL_TABLET | Freq: Every evening | ORAL | Status: DC | PRN

## 2023-08-29 MED ORDER — DAPAGLIFLOZIN PROPANEDIOL 10 MG PO TABS
10.0000 mg | ORAL_TABLET | Freq: Every day | ORAL | Status: DC
  Administered 2023-08-30 – 2023-09-05 (×7): 10 mg via ORAL
  Filled 2023-08-29 (×7): qty 1

## 2023-08-29 MED ORDER — CARVEDILOL 3.125 MG PO TABS
3.1250 mg | ORAL_TABLET | Freq: Two times a day (BID) | ORAL | Status: DC
  Administered 2023-08-29 – 2023-09-02 (×8): 3.125 mg via ORAL
  Filled 2023-08-29 (×8): qty 1

## 2023-08-29 MED ORDER — ANASTROZOLE 1 MG PO TABS
1.0000 mg | ORAL_TABLET | Freq: Every day | ORAL | Status: DC
  Filled 2023-08-29: qty 1

## 2023-08-29 MED ORDER — WARFARIN SODIUM 2 MG PO TABS: 2.0000 mg | ORAL_TABLET | Freq: Every day | ORAL | Status: DC

## 2023-08-29 MED ORDER — POTASSIUM CHLORIDE CRYS ER 20 MEQ PO TBCR
20.0000 meq | EXTENDED_RELEASE_TABLET | Freq: Once | ORAL | Status: AC
  Administered 2023-08-29: 20 meq via ORAL
  Filled 2023-08-29: qty 1

## 2023-08-29 MED ORDER — SODIUM CHLORIDE 0.9 % IV SOLN
12.0000 10*6.[IU] | Freq: Two times a day (BID) | INTRAVENOUS | Status: DC
  Administered 2023-08-29 – 2023-09-05 (×13): 12 10*6.[IU] via INTRAVENOUS
  Filled 2023-08-29 (×15): qty 12

## 2023-08-29 MED ORDER — INSULIN ASPART 100 UNIT/ML IJ SOLN
3.0000 [IU] | Freq: Three times a day (TID) | INTRAMUSCULAR | Status: DC
  Administered 2023-08-30 – 2023-09-05 (×11): 3 [IU] via SUBCUTANEOUS

## 2023-08-29 MED ORDER — ENSURE ENLIVE PO LIQD: 237.0000 mL | Freq: Two times a day (BID) | ORAL | Status: DC

## 2023-08-29 MED ORDER — BANATROL TF EN LIQD: 60.0000 mL | Freq: Four times a day (QID) | ENTERAL | Status: DC

## 2023-08-29 MED ORDER — WARFARIN - PHARMACIST DOSING INPATIENT: Freq: Every day | Status: DC

## 2023-08-29 MED ORDER — MELATONIN 5 MG PO TABS
5.0000 mg | ORAL_TABLET | Freq: Every evening | ORAL | Status: DC | PRN
  Administered 2023-09-04: 5 mg via ORAL
  Filled 2023-08-29 (×2): qty 1

## 2023-08-29 NOTE — Progress Notes (Signed)
 Expand All Collapse All PMR Admission Coordinator Pre-Admission Assessment   Patient: Melissa Bray is an 63 y.o., female MRN: [id number] DOB: 28-Sep-1960 Height: 5\' 5"  (165.1 cm) Weight: 115.1 kg   Insurance Information HMO:     PPO:  yes    PCP:      IPA:      80/20:      OTHER:  PRIMARY: BCBS Commercial PPO      Policy#: [id number]       Subscriber: Pt CM Name: TBD      Phone#: TBD     Fax#: [phone] (PLEASE confirm for concurrent review!!) Pre-Cert#: [id number] Received approval via fax on 08/28/23. Pt approved from 08/28/23-09/10/23. Pt approved for 14 days.      Employer:  Benefits:  Phone #:      Name:  Eff Date: 09/30/2022- 03/31/9998 Deductible: $7,500 ($6,281.99 met) OOP Max: $7,500 ($6,281.99 met) CIR: 100% coverage SNF: 100% coverage, limited to 120 days/cal yr (120 remaining) Outpatient: 100% coverage Home Health: 100% coverage; limited to 120 visits/cal yr (120 remaining) DME: 100% coverage Providers: In network    SECONDARY:       Policy#:      Phone#:    Artist:       Phone#:    The Data processing manager" for patients in Inpatient Rehabilitation Facilities with attached "Privacy Act Statement-Health Care Records" was provided and verbally reviewed with: n/a   Emergency Contact Information Contact Information       Name Relation Home Work [place] Son     [phone]         Other Contacts       Name Relation Home Work Mobile    [place] Sister     [phone]    Bartolo Bors     [phone]           Current Medical History  Patient Admitting Diagnosis: Endocarditis, HF History of Present Illness: Melissa Bray is a 63 y.o. female with medical history significant for type 2 diabetes mellitus, essential hypertension, hyperlipidemia, breast cancer status post left mastectomy in 2016, who was admitted to [facility] on 07/31/2023 by way of transfer from [facility] with  suspected severe sepsis due to left upper extremity cellulitis after presenting from home to the latter facility complaining of generalized weakness. Imaging in the ED, per corresponding formal radiology read, was notable for the following: 2 view chest x-ray showed no evidence of acute cardiopulmonary process, including no evidence of infiltrate, edema, effusion, or pneumothorax. Vital signs in the ED were notable for the following: Temperature max 1-3.2; heart rates initially in the 1 teens, essentially decreasing into the 90s following IV fluids; initial blood pressure 95/65, with ensuing increase in systolic blood pressures into the 1 teens 120s following interval IV fluids; respiratory rate 19-24, oxygen  saturation 94 to 98% on room air. Labs were notable for the following: CMP was notable for the following: Sodium 131, which corrected to approximately 134 when taking into account concomitant hyperglycemia, potassium 3.6, bicarbonate 20, anion gap 18, creatinine 1.11 compared to 0.8 in November 2024, glucose 288, liver enzymes were within normal limits.  Hemoglobin A1c 12.8%.  Initial lactic acid 1.4, with repeat value trending down to 1.3.  CBC notable for white blood cell count 9700 with 81% neutrophils.  INR 1.1.  Urinalysis showed 11-20 white blood cells, nitrate negative, small leukocyte esterase, along with 6-10 squamous cells, 100 protein and specific gravity of 1.025.  Blood cultures x 2 were collected prior to initiation of antibiotics.  COVID, influenza, RSV PCR were all negative. EKG in ED demonstrated the following: Sinus tachycardia with heart rate 106, normal intervals, no evidence of T wave changes, nonspecific less than 1 mm ST elevation in leads III, aVF, no evidence of greater than 1 mm ST elevation. She was transferred  07/31/23 to [facility]. During her evaluation she was diagnosed with GBS bacteremia and mitral valve endocarditis with leaflet perforation. She required dental  extractions for dental carries. On 08/08/23, while in cath lab for LHC, Pt. deteriorated due to flash pulmonary edema, intubated, IABP. She was intubated 08/08/23-08/11/23. Pt. Underwent Mitral valve replacement 08/12/23. Both cardiothoracic surgery and heart failure team continue to follow. She was placed on penicillin  G per infectious disease recommendations of 6 weeks initiated 08/13/2023 through 09/24/2023. Pt. Seen by PT/OT and they recommended CIR to assist return to PLOF.      Patient's medical record from [facility] has been reviewed by the rehabilitation admission coordinator and physician.   Past Medical History      Past Medical History:  Diagnosis Date   Anxiety     Breast cancer (HCC)     Diabetes mellitus without complication (HCC)     Hyperlipidemia     Hypertension     Type 2 diabetes mellitus without retinopathy (HCC) 06/17/2017          Has the patient had major surgery during 100 days prior to admission? Yes   Family History   family history includes Asthma in her brother and mother; Cancer in her brother and father; Diabetes in her father; Hypertension in her father.   Current Medications  Current Medications    Current Facility-Administered Medications:    amiodarone  (PACERONE ) tablet 200 mg, 200 mg, Oral, Daily, Stoner, Benjamin J, MD, 200 mg at 08/29/23 4098   anastrozole  (ARIMIDEX ) tablet 1 mg, 1 mg, Oral, Daily, Hendrickson, Steven C, MD, 1 mg at 08/29/23 0850   carvedilol (COREG) tablet 3.125 mg, 3.125 mg, Oral, BID WC, Stehler, Barba Bonnet, PA-C, 3.125 mg at 08/29/23 1191   Chlorhexidine  Gluconate Cloth 2 % PADS 6 each, 6 each, Topical, Daily, [name], MD, 6 each at 08/28/23 4782   clonazePAM  (KLONOPIN ) tablet 0.5 mg, 0.5 mg, Oral, BID PRN, Hendrickson, Steven C, MD, 0.5 mg at 08/26/23 2116   [facility], Patient & Family Education, , Does not apply, Once, Hilarie Lovely, MD   dapagliflozin propanediol (FARXIGA)  tablet 10 mg, 10 mg, Oral, Daily, Dennise Fitz L, NP, 10 mg at 08/29/23 9562   dextrose  50 % solution 0-50 mL, 0-50 mL, Intravenous, PRN, Gold, Wayne E, PA-C   feeding supplement (ENSURE ENLIVE / ENSURE PLUS) liquid 237 mL, 237 mL, Oral, BID BM, Clark, Laura P, DO, 237 mL at 08/28/23 1323   fiber supplement (BANATROL TF) liquid 60 mL, 60 mL, Oral, QID, [name], MD, 60 mL at 08/29/23 1308   Gerhardt's butt cream, , Topical, Daily PRN, [name], MD   insulin  aspart (novoLOG ) injection 0-15 Units, 0-15 Units, Subcutaneous, TID WC, [name], MD, 5 Units at 08/27/23 1149   insulin  aspart (novoLOG ) injection 0-5 Units, 0-5 Units, Subcutaneous, QHS, [name], MD   insulin  aspart (novoLOG ) injection 3 Units, 3 Units, Subcutaneous, TID WC, Stehler, Barba Bonnet, PA-C, 3 Units at 08/28/23 1821   insulin  glargine-yfgn (SEMGLEE ) injection 25 Units, 25 Units, Subcutaneous, BID,  Randa Burton, PA-C, 25 Units at 08/29/23 6045   melatonin tablet 5 mg, 5 mg, Oral, QHS PRN, Hendrickson, Steven C, MD, 5 mg at 08/28/23 2117   ondansetron  (ZOFRAN ) injection 4 mg, 4 mg, Intravenous, Q6H PRN, Gold, Wayne E, PA-C, 4 mg at 08/17/23 1617   Oral care mouth rinse, 15 mL, Mouth Rinse, PRN, [name], MD   oxyCODONE  (Oxy IR/ROXICODONE ) immediate release tablet 5-10 mg, 5-10 mg, Oral, Q3H PRN, Hendrickson, Steven C, MD, 10 mg at 08/28/23 1356   pantoprazole  (PROTONIX ) EC tablet 40 mg, 40 mg, Oral, Daily, Marybelle Smiling, RPH, 40 mg at 08/29/23 4098   penicillin  G potassium 12 Million Units in sodium chloride  0.9 % 500 mL CONTINUOUS infusion, 12 Million Units, Intravenous, Q12H, Orson Blalock, NP, Last Rate: 41.7 mL/hr at 08/29/23 0847, 12 Million Units at 08/29/23 0847   sacubitril -valsartan  (ENTRESTO ) 49-51 mg per tablet, 1 tablet, Oral, BID, Hendrickson, Steven C, MD   senna-docusate (Senokot-S) tablet 1 tablet, 1 tablet, Oral, QHS PRN, [name],  MD   sodium chloride  flush (NS) 0.9 % injection 10-40 mL, 10-40 mL, Intracatheter, Q12H, [name], MD, 10 mL at 08/29/23 0850   sodium chloride  flush (NS) 0.9 % injection 10-40 mL, 10-40 mL, Intracatheter, PRN, [name], MD   sodium chloride  flush (NS) 0.9 % injection 3 mL, 3 mL, Intravenous, Q12H, Lightfoot, Harrell O, MD, 3 mL at 08/28/23 0855   sodium chloride  flush (NS) 0.9 % injection 3 mL, 3 mL, Intravenous, PRN, Lightfoot, Harrell O, MD   spironolactone  (ALDACTONE ) tablet 12.5 mg, 12.5 mg, Oral, Daily, Lauralee Poll, MD, 12.5 mg at 08/29/23 1191   torsemide  (DEMADEX ) tablet 20 mg, 20 mg, Oral, Daily, Dennise Fitz L, NP, 20 mg at 08/29/23 4782   traMADol  (ULTRAM ) tablet 50-100 mg, 50-100 mg, Oral, Q4H PRN, Hendrickson, Steven C, MD, 100 mg at 08/24/23 1635   Warfarin - Pharmacist Dosing Inpatient, , Does not apply, q1600, Bitonti, Michael T, [facility], Given at 08/27/23 1708     Patients Current Diet:  Diet Order                  Diet regular Room service appropriate? Yes with Assist; Fluid consistency: Thin  Diet effective now                         Precautions / Restrictions Precautions Precautions: Sternal, Fall Precaution Booklet Issued: Yes (comment) Precaution/Restrictions Comments: good recall of precautions Restrictions Weight Bearing Restrictions Per Provider Order: Yes RUE Weight Bearing Per Provider Order: Non weight bearing LUE Weight Bearing Per Provider Order: Non weight bearing RLE Weight Bearing Per Provider Order: Weight bearing as tolerated LLE Weight Bearing Per Provider Order: Weight bearing as tolerated Other Position/Activity Restrictions: sternal    Has the patient had 2 or more falls or a fall with injury in the past year? No   Prior Activity Level Community (5-7x/wk): Pt. active in the community PTA   Prior Functional Level Self Care: Did the patient need help bathing, dressing, using the toilet or eating? Independent    Indoor Mobility: Did the patient need assistance with walking from room to room (with or without device)? Independent   Stairs: Did the patient need assistance with internal or external stairs (with or without device)? Independent   Functional Cognition: Did the patient need help planning regular tasks such as shopping or remembering to take medications? Independent   Patient  Information Are you of Hispanic, Latino/a,or Spanish origin?: A. No, not of Hispanic, Latino/a, or Spanish origin What is your race?: B. Black or African American Do you need or want an interpreter to communicate with a doctor or health care staff?: 0. No   Patient's Response To:  Health Literacy and Transportation Is the patient able to respond to health literacy and transportation needs?: Yes Health Literacy - How often do you need to have someone help you when you read instructions, pamphlets, or other written material from your doctor or pharmacy?: Never In the past 12 months, has lack of transportation kept you from medical appointments or from getting medications?: No In the past 12 months, has lack of transportation kept you from meetings, work, or from getting things needed for daily living?: No   Home Assistive Devices / Equipment Home Equipment: None   Prior Device Use: Indicate devices/aids used by the patient prior to current illness, exacerbation or injury? None of the above   Current Functional Level Cognition   Orientation Level: Oriented X4    Extremity Assessment (includes Sensation/Coordination)   Upper Extremity Assessment: Overall WFL for tasks assessed LUE Deficits / Details: edema noted LUE, shoulder/elbow/hand AROM WFL LUE Sensation: WNL LUE Coordination: WNL  Lower Extremity Assessment: Defer to PT evaluation     ADLs   Overall ADL's : Needs assistance/impaired Eating/Feeding: Set up, Bed level Grooming: Wash/dry hands, Wash/dry face, Oral care, Set up, Sitting Grooming Details  (indicate cue type and reason): at sink Upper Body Bathing: Set up, Sitting Upper Body Bathing Details (indicate cue type and reason): at sink Lower Body Bathing: Moderate assistance, Sit to/from stand Lower Body Bathing Details (indicate cue type and reason): bathed peri area front seated and stood for peri area back Upper Body Dressing : Minimal assistance, Sitting Upper Body Dressing Details (indicate cue type and reason): to change gowns Lower Body Dressing: Moderate assistance, Sit to/from stand Lower Body Dressing Details (indicate cue type and reason): able to doff socks but required assistance to get over toes and patient able to complete while in figure 4 Toilet Transfer: Minimal assistance, BSC/3in1 Toilet Transfer Details (indicate cue type and reason): able to transfer from EOB to [facility] with min assist Toileting- Clothing Manipulation and Hygiene: Sit to/from stand, Minimal assistance Toileting - Clothing Manipulation Details (indicate cue type and reason): able to perform toilet hygiene while standing with min assist for clothing management Functional mobility during ADLs: Minimal assistance, Rolling walker (2 wheels) General ADL Comments: continues to require momentum and min assist to stand     Mobility   Overal bed mobility: Needs Assistance Bed Mobility: Supine to Sit Rolling: Contact guard assist Sidelying to sit: Min assist Supine to sit: Min assist Sit to sidelying: Min assist General bed mobility comments: OOB in recliner     Transfers   Overall transfer level: Needs assistance Equipment used: Rolling walker (2 wheels) Transfers: Sit to/from Stand Sit to Stand: Contact guard assist Bed to/from chair/wheelchair/BSC transfer type:: Step pivot Step pivot transfers: Min assist, Contact guard assist General transfer comment: uses momentum, cues for hands on knees or across chest     Ambulation / Gait / Stairs / Wheelchair Mobility   Ambulation/Gait Ambulation/Gait  assistance: Contact guard assist Gait Distance (Feet): 240 Feet Assistive device: Rolling walker (2 wheels) Gait Pattern/deviations: Step-through pattern, Decreased stride length General Gait Details: slow, steady gait with RW Gait velocity: decreased Gait velocity interpretation: <1.31 ft/sec, indicative of household ambulator  Posture / Balance Balance Overall balance assessment: Needs assistance Sitting-balance support: No upper extremity supported, Feet supported Sitting balance-Leahy Scale: Good Standing balance support: No upper extremity supported, Bilateral upper extremity supported, During functional activity Standing balance-Leahy Scale: Fair Standing balance comment: able to stand at sink with no UE support     Special needs/care consideration Skin surgical incision    Previous Home Environment (from acute therapy documentation) Living Arrangements: Children  Lives With: Son Available Help at Discharge: Family, Available 24 hours/day Type of Home: House Home Layout: One level Home Access: Stairs to enter Entrance Stairs-Rails: None Entrance Stairs-Number of Steps: 2 Bathroom Shower/Tub: Hydrographic surveyor, Engineer, building services: Pharmacist, community: Yes Home Care Services: No Additional Comments: 24/7 between son and caretaker   Discharge Living Setting Plans for Discharge Living Setting: Patient's home Type of Home at Discharge: House Discharge Home Layout: One level Discharge Home Access: Stairs to enter Entrance Stairs-Rails: None Entrance Stairs-Number of Steps: 2 Discharge Bathroom Shower/Tub: Tub/shower unit Discharge Bathroom Toilet: Standard Discharge Bathroom Accessibility: No Does the patient have any problems obtaining your medications?: No   Social/Family/Support Systems Patient Roles: Other (Comment) Contact Information: [phone] Anticipated Caregiver: Bambi Lever (son) Anticipated Caregiver's Contact Information: Lives with son,  who works days. Has a caretaker while son works Medical laboratory scientific officer: 24/7 Discharge Plan Discussed with Primary Caregiver: Yes Is Caregiver In Agreement with Plan?: No Does Caregiver/Family have Issues with Lodging/Transportation while Pt is in Rehab?: Yes   Goals Patient/Family Goal for Rehab: PT/OT mod I to supervision Expected length of stay: 7-10 days Pt/Family Agrees to Admission and willing to participate: Yes Program Orientation Provided & Reviewed with Pt/Caregiver Including Roles  & Responsibilities: Yes   Decrease burden of Care through IP rehab admission: not anticipated   Possible need for SNF placement upon discharge: Not anticipated   Patient Condition: I have reviewed medical records from [facility] , spoken with CM, and patient and son. I met with patient at the bedside for inpatient rehabilitation assessment.  Patient will benefit from ongoing PT, OT, and SLP, can actively participate in 3 hours of therapy a day 5 days of the week, and can make measurable gains during the admission.  Patient will also benefit from the coordinated team approach during an Inpatient Acute Rehabilitation admission.  The patient will receive intensive therapy as well as Rehabilitation physician, nursing, social worker, and care management interventions.  Due to safety, skin/wound care, disease management, medication administration, pain management, and patient education the patient requires 24 hour a day rehabilitation nursing.  The patient is currently CGA with mobility and Min-Mod A with basic ADLs.  Discharge setting and therapy post discharge at home with home health is anticipated.  Patient has agreed to participate in the Acute Inpatient Rehabilitation Program and will admit today.   Preadmission Screen Completed By:  Dorena Gander, 08/29/2023 9:37 AM ______________________________________________________________________   Discussed status with Dr. Rachel Budds on 08/29/23  at  9:37 AM and received approval for admission today.   Admission Coordinator:  Giles Labrum, time 9:37 AM/Date 08/29/23         MD Signature: Rawland Caddy, MD, [facility] Physical Medicine & Rehabilitation Medical Director Rehabilitation Services 08/29/2023

## 2023-08-29 NOTE — Progress Notes (Addendum)
 PHARMACY - ANTICOAGULATION CONSULT NOTE  Pharmacy Consult for warfarin Indication: onX mMVR  No Known Allergies  Patient Measurements: Height: 5\' 5"  (165.1 cm) Weight: 115.1 kg (253 lb 12 oz) IBW/kg (Calculated) : 57 HEPARIN  DW (KG): 84.3  Vital Signs: Temp: 97.6 F (36.4 C) (05/30 0744) Temp Source: Oral (05/30 0744) BP: 154/97 (05/30 0744) Pulse Rate: 88 (05/30 0744)  Labs: Recent Labs    08/27/23 0440 08/28/23 0430 08/28/23 1005 08/28/23 1253 08/29/23 0400  HGB 9.4* 9.0*  --   --  9.3*  HCT 30.2* 28.6*  --   --  29.3*  PLT 470* 429*  --   --  404*  LABPROT 39.3* 38.5*  --   --  39.2*  INR 4.0* 3.9*  --   --  4.0*  CREATININE 0.99  --  0.96 1.11* 0.93    Estimated Creatinine Clearance: 77.4 mL/min (by C-G formula based on SCr of 0.93 mg/dL).   Medical History: Past Medical History:  Diagnosis Date   Anxiety    Breast cancer (HCC)    Diabetes mellitus without complication (HCC)    Hyperlipidemia    Hypertension    Type 2 diabetes mellitus without retinopathy (HCC) 06/17/2017    Medications:  Scheduled:   amiodarone   200 mg Oral Daily   anastrozole   1 mg Oral Daily   carvedilol   3.125 mg Oral BID WC   Chlorhexidine  Gluconate Cloth  6 each Topical Daily   [facility], Patient & Family Education   Does not apply Once   dapagliflozin  propanediol  10 mg Oral Daily   feeding supplement  237 mL Oral BID BM   fiber supplement (BANATROL TF)  60 mL Oral QID   insulin  aspart  0-15 Units Subcutaneous TID WC   insulin  aspart  0-5 Units Subcutaneous QHS   insulin  aspart  3 Units Subcutaneous TID WC   insulin  glargine-yfgn  25 Units Subcutaneous BID   pantoprazole   40 mg Oral Daily   potassium chloride   20 mEq Oral Once   sacubitril -valsartan   1 tablet Oral BID   sodium chloride  flush  10-40 mL Intracatheter Q12H   sodium chloride  flush  3 mL Intravenous Q12H   spironolactone   12.5 mg Oral Daily   torsemide   20 mg Oral Daily   Warfarin -  Pharmacist Dosing Inpatient   Does not apply q1600    Assessment: 68 yof presenting with acute mitral valve endocarditis with perforated mitral leaflet and severe MR. Underwent dental extraction 5/8. Presented for pre-op RHC and developed flash pulmonary edema requiring intubation - IABP was placed in lab and IV heparin  started. No AC PTA.  Pt now s/p cardiac surgery with onX mMVR placed. Pharmacy consulted to start warfarin 5/16 after IABP removal 5/15. S/p DCCV 5/23 and maintaining NSR. Heparin  bridge dc'd once INR > 2.  5/30: -INR back up to 4.0, remains above goal -Warfarin was on hold x3d (last dose 5/25), TCTS ordered 6 mg yesterday -Reports good appetite, continue ensure bid  -Discharging to CIR today, Pharmacy will continue to dose per TCTS  Goal of Therapy:  INR 2.5-3.5 Monitor platelets by anticoagulation protocol: Yes   Plan:  HOLD warfarin today  Daily INR and CBC  Cecillia Cogan, PharmD Clinical Pharmacist 08/29/2023  8:45 AM

## 2023-08-29 NOTE — Progress Notes (Signed)
 Patient ID: Melissa Bray, female   DOB: 12-15-1960, 63 y.o.   MRN: [id number]                                                                                               Advanced Heart Failure Rounding Note  Chief Complaint: CHF  Subjective:    63 y/o obese woman admitted with cardiogenic shock in setting of severe MR due to endocarditis and perforation of anterior MV leaflet. Underwent On-X mechanical MVR on 08/14/23. Post-op course c/b RV Failure  POD #16 s/p MVR  Creatinine WNL. K 3.9. CVP <5  S/p DCCV to NSR 5/23, she remains in NSR now on po amiodarone .  INR 4.   Continue to feel better. Denies CP/SOB. Awaiting CIR approval.   Objective:   Echo (5/24): EF 55-60%, mildly D-shaped ventricle with mild RV dysfunction and mild RV enlargement, IVC dilated, On-X mechanical MV functioning normally.   Weight Range: 115.1 kg Body mass index is 42.23 kg/m.   Vital Signs:   Temp:  [97.6 F (36.4 C)-98.8 F (37.1 C)] 98.8 F (37.1 C) (05/30 1110) Pulse Rate:  [84-88] 88 (05/30 0744) Resp:  [17-25] 17 (05/30 1110) BP: (123-156)/(59-97) 143/97 (05/30 1110) SpO2:  [95 %-99 %] 95 % (05/30 1110) Weight:  [115.1 kg] 115.1 kg (05/30 0642) Last BM Date : 08/28/23  Weight change: Filed Weights   08/27/23 0407 08/28/23 0401 08/29/23 0642  Weight: 115.5 kg 116.3 kg 115.1 kg   Intake/Output: No intake or output data in the 24 hours ending 08/29/23 1202   Physical Exam  General:  well appearing.  No respiratory difficulty Neck: supple. JVD flat.  Cor: PMI nondisplaced. Regular rate & rhythm. No rubs, gallops or murmurs. Lungs: clear Abdomen: soft, nontender, nondistended. Good bowel sounds. Extremities: no cyanosis, clubbing, rash, edema. PICC RUE. LUE edema (improving) Neuro: alert & oriented x 3. Moves all 4 extremities w/o difficulty. Affect pleasant.   Telemetry   NSR 90s (Personally reviewed)    Labs    CBC Recent Labs    08/28/23 0430 08/29/23 0400  WBC 6.7 6.4   HGB 9.0* 9.3*  HCT 28.6* 29.3*  MCV 91.1 90.2  PLT 429* 404*   Basic Metabolic Panel Recent Labs    19/14/78 0430 08/28/23 1005 08/28/23 1253 08/29/23 0400  NA  --    < > 138 138  K  --    < > 4.1 3.9  CL  --    < > 104 105  CO2  --    < > 22 26  GLUCOSE  --    < > 221* 90  BUN  --    < > 12 11  CREATININE  --    < > 1.11* 0.93  CALCIUM   --    < > 9.5 8.9  MG 1.8  --   --  2.3   < > = values in this interval not displayed.   Imaging   No results found.   Medications:    Scheduled Medications:  amiodarone   200 mg Oral Daily   anastrozole   1 mg Oral  Daily   carvedilol  3.125 mg Oral BID WC   Chlorhexidine  Gluconate Cloth  6 each Topical Daily   [facility], Patient & Family Education   Does not apply Once   dapagliflozin propanediol  10 mg Oral Daily   feeding supplement  237 mL Oral BID BM   insulin  aspart  0-15 Units Subcutaneous TID WC   insulin  aspart  0-5 Units Subcutaneous QHS   insulin  aspart  3 Units Subcutaneous TID WC   insulin  glargine-yfgn  25 Units Subcutaneous BID   pantoprazole   40 mg Oral Daily   sacubitril-valsartan  1 tablet Oral BID   sodium chloride  flush  10-40 mL Intracatheter Q12H   sodium chloride  flush  3 mL Intravenous Q12H   spironolactone   12.5 mg Oral Daily   torsemide  20 mg Oral Daily   Warfarin - Pharmacist Dosing Inpatient   Does not apply q1600   Infusions:  penicillin  G potassium 12 Million Units in sodium chloride  0.9 % 500 mL CONTINUOUS infusion 12 Million Units (08/29/23 0847)   PRN Medications: clonazePAM , dextrose , Gerhardt's butt cream, melatonin, ondansetron  (ZOFRAN ) IV, mouth rinse, oxyCODONE , senna-docusate, sodium chloride  flush, sodium chloride  flush, traMADol   Assessment/Plan  1. Cardiogenic shock: Valvular shock from severe MR.  TEE on 5/5 showed LV EF 65%, normal RV function, severe MR with perforated leaflet and mobile vegetation.  Patient developed flash pulmonary edema 5/9 in setting of marked  hypertension -> IABP placed. - s/p mMVR 08/14/23 - Echo 5/24: EF 55-60%, mildly D-shaped ventricle with mild RV dysfunction and mild RV enlargement, IVC dilated, On-X mechanical MV functioning normally.  - CVP <5 today. Continue Torsemide 20 mg daily - Continue Farxiga 10 mg daily today - Continue coreg 3.125 mg BID - Continue spiro 12.5 mg daily.  - Remains hypertensive. Increase Entresto 24-26>49-51 mg BID. Increase to 97-103 if BP persistently elevated. - Milrinone  stopped 5/22, stable.    2. Mitral regurgitation: Severe on TEE with perforated anterior leaflet and mitral valve vegetation.  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  - s/p On-X mechanical MVR with Dr. Luna Salinas 08/14/23 - post-op antibiotics per ID. On PenG, will require 6 weeks IV abx (start date 05/14). - Now on warfarin, goal INR 2.5 -3.5. Also on ASA 81. Dosing per TCTS  3. Acute hypercarbic/hypoxemic respiratory failure in setting of #1&2 - Resolved - Encouraged IS   4. Atrial flutter with RVR - S/p DCCV 5/23. Maintaining NSR  - Transitioned to po amiodarone .  - Got a dose of Warfarin yesterday. INR 4 today. Dosing per TCTS, would hold today.   5. Morbid obesity - Body mass index is 42.23 kg/m.  6. Hypervolemic Hyponatremia - resolved w/ diuresis.    7. Anemia - hgb 9.3 today.  - labs c/w IDA but given acute infection will hold off on IV Fe   8. Hyperkalemia - K occasionally says with false elevation from PCN G infusing through PICC where blood gets collected from.  - If K comes back high please check peripherally before stopping spiro  9. Hypertension - SBP 150s this morning, remains elevated. Changes as above  Plan for discharge to CIR today. AHF team will sign off. Will arrange f/u in AHF clinic around the time she may be discharged from CIR. Please let us  know if we need to rescheduled.   Length of Stay: 27  Sheryl Donna, NP  08/29/2023, 12:02 PM  Advanced Heart Failure  Team Pager [phone] (M-F; 7a -  5p)  Please contact [facility] for night-coverage after hours (5p -7a ) and weekends on [email or web address]

## 2023-08-29 NOTE — Progress Notes (Signed)
 Signed     Expand All Collapse All          Physical Medicine and Rehabilitation Consult Reason for Consult:debility Referring Physician: Luna Salinas     HPI: Melissa Bray is a 63 y.o. female with a history of diabetes type 2, hypertension and breast cancer status post left mastectomy in 2016 who was originally admitted to [facility] on 07/31/2023 for suspected sepsis due to left upper extremity cellulitis.  She was found to have significant edema in the left upper extremity and was treated for sepsis/cellulitis when a murmur was noted.  She was transferred to [facility] and a TEE was performed on 5 5 which demonstrated severe mitral regurgitation due to endocarditis.  Patient had extraction of her teeth on 5 8.  The following day she deteriorated and went to cardiogenic shock.  She was intubated and intra-aortic balloon pump was placed.  Mitral valve replacement was performed on 08/12/2023.  She underwent TEE and cardioversion on 08/22/2023.  Both cardiothoracic surgery and heart failure team are following the patient.  She is on penicillin  G per ID Recommendations for 6 weeks starting from 08/13/2023 (6/25).  Patient has been up with therapy and today ambulated 160 feet contact-guard assistance level.  She was min assist for sit to stand transfers.  She worked with occupational therapy yesterday and required minimal assistance for basic ADLs.  Patient lives at home with her children in a 1 level house with 2 steps to enter.  She was independent and driving prior to this hospitalization.  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[employer]: Home Living Family/patient expects to be discharged to:: Private residence Living Arrangements: Children Available Help at Discharge: Family, Available 24 hours/day Type of Home: House Home Access: Stairs to enter Secretary/administrator of Steps: 2 Entrance Stairs-Rails: None Home Layout: One level Bathroom Shower/Tub: Tub/shower unit,  Engineer, building services: Pharmacist, community: Yes Home Equipment: None Additional Comments: 24/7 between son and caretaker  Lives With: Son  Functional History: Prior Function Prior Level of Function : Independent/Modified Independent, Driving Mobility Comments: independent without AD, no falls in past 6 months but reports balance has been off for ~5 days; 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: independent Functional Status:  Mobility: Bed Mobility Overal bed mobility: Needs Assistance Bed Mobility: Supine to Sit Rolling: Contact guard assist Sidelying to sit: Min assist Supine to sit: Min assist Sit to sidelying: Min assist General bed mobility comments: assist to elevate trunk and maintain sternal precautions Transfers Overall transfer level: Needs assistance Equipment used: Rolling walker (2 wheels) Transfers: Sit to/from Stand Sit to Stand: Min assist Bed to/from chair/wheelchair/BSC transfer type:: Step pivot Step pivot transfers: Min assist, Contact guard assist General transfer comment: Pt using momentum to power up, hugging heart pillow to maintain sternal precautions Ambulation/Gait Ambulation/Gait assistance: Contact guard assist Gait Distance (Feet): 160 Feet Assistive device: Rolling walker (2 wheels) Gait Pattern/deviations: Step-through pattern, Decreased stride length General Gait Details: slow, steady gait with RW Gait velocity: decreased Gait velocity interpretation: <1.31 ft/sec, indicative of household ambulator   ADL: ADL Overall ADL's : Needs assistance/impaired Eating/Feeding: Set up, Bed level Grooming: Wash/dry hands, Wash/dry face, Oral care, Set up, Sitting Grooming Details (indicate cue type and reason): at sink Upper Body Bathing: Set up, Sitting Upper Body Bathing Details (indicate cue type and reason): at sink Lower Body Bathing: Maximal assistance, Sitting/lateral leans, +2 for safety/equipment, Sit to/from stand Upper Body  Dressing : Maximal assistance, Sitting Lower Body Dressing: Maximal assistance, Sit to/from stand, Total assistance, +2 for safety/equipment, Sitting/lateral leans Toilet Transfer: Minimal assistance, BSC/3in1 Toilet Transfer Details (indicate cue type and reason): able to transfer from EOB to [facility] with min assist Toileting- Clothing Manipulation and Hygiene: Sit to/from stand, Minimal assistance Toileting - Clothing Manipulation Details (indicate cue type and reason): able to perform toilet hygiene while standing with min assist for clothing management Functional mobility during ADLs: Minimal assistance, Rolling walker (2 wheels) General ADL Comments: uses momentum and min assist for sit to stand and min assist for stability for transfers   Cognition: Cognition Orientation Level: Oriented X4 Cognition Arousal: Alert Behavior During Therapy: WFL for tasks assessed/performed     Review of Systems  Constitutional: Negative.   HENT: Negative.    Eyes: Negative.   Respiratory:  Positive for cough and shortness of breath.   Cardiovascular:  Positive for leg swelling. Negative for palpitations.  Gastrointestinal: Negative.   Genitourinary: Negative.   Musculoskeletal: Negative.   Skin: Negative.   Neurological:  Positive for weakness.  Psychiatric/Behavioral:  Negative for depression.         Past Medical History:  Diagnosis Date   Anxiety     Breast cancer (HCC)     Diabetes mellitus without complication (HCC)     Hyperlipidemia     Hypertension     Type 2 diabetes mellitus without retinopathy (HCC) 06/17/2017             Past Surgical History:  Procedure Laterality Date   ABDOMINAL HYSTERECTOMY       BREAST BIOPSY       CARDIOVERSION N/A 08/22/2023    Procedure: CARDIOVERSION;  Surgeon: Lauralee Poll, MD;  Location: [facility] INVASIVE CV LAB;  Service: Cardiovascular;  Laterality: N/A;   CLIPPING OF ATRIAL APPENDAGE   08/13/2023    Procedure: CLIPPING, LEFT ATRIAL APPENDAGE  USING ATRICURE LAA EXCLUSION SYSTEM SIZE 40;  Surgeon: [name], MD;  Location: [facility] OR;  Service: Open Heart Surgery;;   IABP INSERTION N/A 08/08/2023    Procedure: IABP Insertion;  Surgeon: Swaziland, Peter M, MD;  Location: [facility] INVASIVE CV LAB;  Service: Cardiovascular;  Laterality: N/A;   INTRAOPERATIVE TRANSESOPHAGEAL ECHOCARDIOGRAM N/A 08/13/2023    Procedure: ECHOCARDIOGRAM, TRANSESOPHAGEAL, INTRAOPERATIVE;  Surgeon: [name], MD;  Location: [facility] OR;  Service: Open Heart Surgery;  Laterality: N/A;   MASTECTOMY Left 2016   MITRAL VALVE REPLACEMENT N/A 08/13/2023    Procedure: MITRAL VALVE REPLACEMENT USING ON-X PROSTHETIC MITRAL HEART VALVE SIZE 31/33MM;  Surgeon: [name], MD;  Location: [facility];  Service: Open Heart Surgery;  Laterality: N/A;   RIGHT/LEFT HEART CATH AND CORONARY ANGIOGRAPHY N/A 08/08/2023    Procedure: RIGHT/LEFT HEART CATH AND CORONARY ANGIOGRAPHY;  Surgeon: Swaziland, Peter M, MD;  Location: [facility] INVASIVE CV LAB;  Service: Cardiovascular;  Laterality: N/A;   TOOTH EXTRACTION N/A 08/07/2023    Procedure: EXTRACTION TEETH NUMBERS ELEVEN, THIRTEEN, AND FOURTEEN;  Surgeon: Ascencion Lava, DMD;  Location: [facility] OR;  Service: Oral Surgery;  Laterality: N/A;   TRANSESOPHAGEAL ECHOCARDIOGRAM (CATH LAB) N/A 08/04/2023    Procedure: TRANSESOPHAGEAL ECHOCARDIOGRAM;  Surgeon: Hazle Lites, MD;  Location: [facility] INVASIVE CV LAB;  Service: Cardiovascular;  Laterality: N/A;   TRANSESOPHAGEAL ECHOCARDIOGRAM (CATH LAB) N/A 08/22/2023    Procedure: TRANSESOPHAGEAL ECHOCARDIOGRAM;  Surgeon: Lauralee Poll, MD;  Location: [facility] INVASIVE CV LAB;  Service: Cardiovascular;  Laterality: N/A;  Family History  Problem Relation Age of Onset   Asthma Mother     Cancer Father          prostate cancer   Diabetes Father     Hypertension Father     Cancer Brother          prostate cancer   Asthma Brother     Colon cancer Neg Hx     Colon polyps Neg Hx     Crohn's  disease Neg Hx     Esophageal cancer Neg Hx     Rectal cancer Neg Hx     Stomach cancer Neg Hx     Ulcerative colitis Neg Hx          Social History:  reports that she has never smoked. She has never been exposed to tobacco smoke. She has never used smokeless tobacco. She reports that she does not drink alcohol and does not use drugs. Allergies:  Allergies  No Known Allergies         Medications Prior to Admission  Medication Sig Dispense Refill   alendronate  (FOSAMAX ) 70 MG tablet Take 1 tablet (70 mg total) by mouth once a week. Take with a full glass of water on an empty stomach. 12 tablet 3   amLODipine  (NORVASC ) 10 MG tablet Take 1 tablet (10 mg total) by mouth at bedtime. (Patient taking differently: Take 20 mg by mouth at bedtime.) 90 tablet 1   anastrozole  (ARIMIDEX ) 1 MG tablet Take 1 mg by mouth daily.       cloNIDine  (CATAPRES ) 0.1 MG tablet Take 1 tablet (0.1 mg total) by mouth 2 (two) times daily. 180 tablet 3   glipiZIDE  (GLUCOTROL  XL) 10 MG 24 hr tablet Take 1 tablet (10 mg total) by mouth daily with breakfast. 90 tablet 3   insulin  glargine (LANTUS ) 100 UNIT/ML Solostar Pen Inject 20 Units into the skin daily. 3 mL 0   lisinopril  (ZESTRIL ) 40 MG tablet Take 1 tablet (40 mg total) by mouth daily. 90 tablet 3   metFORMIN  (GLUCOPHAGE ) 850 MG tablet Take 1 tablet (850 mg total) by mouth 2 (two) times daily with a meal. 60 tablet 0   metoprolol  succinate (TOPROL -XL) 100 MG 24 hr tablet Take 1 tablet (100 mg total) by mouth daily. 90 tablet 3   pravastatin  (PRAVACHOL ) 20 MG tablet Take 1 tablet (20 mg total) by mouth daily. 90 tablet 3   Vitamin D , Ergocalciferol , (DRISDOL ) 1.25 MG (50000 UNIT) CAPS capsule TAKE 1 CAPSULE ONCE A WEEK FOR 12 WEEKS. AFTER 12 WEEKS SWITCH TO OVER THE COUNTER VITAMIN D  2000 IU 12 capsule 0   Continuous Glucose Sensor (FREESTYLE LIBRE 3 PLUS SENSOR) MISC Inject 1 Units into the skin every 14 (fourteen) days. Change sensor every 15 days. 2 each 11    Insulin  Pen Needle (PEN NEEDLES) 31G X 6 MM MISC 1 application  by Does not apply route at bedtime. 90 each 3            Blood pressure 114/81, pulse 86, temperature 97.9 F (36.6 C), temperature source Oral, resp. rate 19, height 5\' 5"  (1.651 m), weight 114.6 kg, SpO2 95%. Physical Exam Constitutional:      General: She is not in acute distress.    Appearance: She is not ill-appearing.  HENT:     Head: Normocephalic.     Right Ear: External ear normal.     Left Ear: External ear normal.     Nose: Nose normal.  Mouth/Throat:     Mouth: Mucous membranes are moist.  Eyes:     Extraocular Movements: Extraocular movements intact.     Pupils: Pupils are equal, round, and reactive to light.  Cardiovascular:     Rate and Rhythm: Normal rate.  Pulmonary:     Effort: Pulmonary effort is normal.  Abdominal:     Palpations: Abdomen is soft.  Musculoskeletal:     Cervical back: Normal range of motion.     Right lower leg: Edema present.     Left lower leg: Edema present.  Skin:    General: Skin is warm and dry.  Neurological:     Mental Status: She is alert.     Comments: Alert and oriented x 3. Normal insight and awareness. Intact Memory. Normal language and speech. Cranial nerve exam unremarkable. MMT: BUE grossly 4 to 4+/5 prox to distal. BLE 4 prox to 4+/5 distally.  Sensory exam normal for light touch and pain in all 4 limbs. No limb ataxia or cerebellar signs. No abnormal tone appreciated.  [name]    Psychiatric:        Mood and Affect: Mood normal.        Behavior: Behavior normal.        Lab Results Last 24 Hours       Results for orders placed or performed during the hospital encounter of 07/31/23 (from the past 24 hours)  Glucose, capillary     Status: Abnormal    Collection Time: 08/25/23 12:22 PM  Result Value Ref Range    Glucose-Capillary 120 (H) 70 - 99 mg/dL    Comment 1 Notify RN      Comment 2 Document in Chart    Basic metabolic panel     Status: Abnormal     Collection Time: 08/25/23  4:22 PM  Result Value Ref Range    Sodium 138 135 - 145 mmol/L    Potassium 5.8 (H) 3.5 - 5.1 mmol/L    Chloride 106 98 - 111 mmol/L    CO2 23 22 - 32 mmol/L    Glucose, Bld 190 (H) 70 - 99 mg/dL    BUN 19 8 - 23 mg/dL    Creatinine, Ser 9.81 (H) 0.44 - 1.00 mg/dL    Calcium  8.6 (L) 8.9 - 10.3 mg/dL    GFR, Estimated 50 (L) >60 mL/min    Anion gap 9 5 - 15  Glucose, capillary     Status: Abnormal    Collection Time: 08/25/23  4:41 PM  Result Value Ref Range    Glucose-Capillary 165 (H) 70 - 99 mg/dL    Comment 1 Notify RN      Comment 2 Document in Chart    Glucose, capillary     Status: None    Collection Time: 08/25/23  8:16 PM  Result Value Ref Range    Glucose-Capillary 81 70 - 99 mg/dL  Glucose, capillary     Status: None    Collection Time: 08/26/23 12:15 AM  Result Value Ref Range    Glucose-Capillary 70 70 - 99 mg/dL  Glucose, capillary     Status: None    Collection Time: 08/26/23  4:01 AM  Result Value Ref Range    Glucose-Capillary 85 70 - 99 mg/dL  Protime-INR     Status: Abnormal    Collection Time: 08/26/23  5:50 AM  Result Value Ref Range    Prothrombin Time 47.3 (H) 11.4 - 15.2 seconds    INR 5.1 (  HH) 0.8 - 1.2  CBC     Status: Abnormal    Collection Time: 08/26/23  5:50 AM  Result Value Ref Range    WBC 6.5 4.0 - 10.5 K/uL    RBC 3.21 (L) 3.87 - 5.11 MIL/uL    Hemoglobin 9.2 (L) 12.0 - 15.0 g/dL    HCT 16.1 (L) 09.6 - 46.0 %    MCV 89.1 80.0 - 100.0 fL    MCH 28.7 26.0 - 34.0 pg    MCHC 32.2 30.0 - 36.0 g/dL    RDW 04.5 40.9 - 81.1 %    Platelets 429 (H) 150 - 400 K/uL    nRBC 0.0 0.0 - 0.2 %  Magnesium      Status: None    Collection Time: 08/26/23  5:50 AM  Result Value Ref Range    Magnesium  2.1 1.7 - 2.4 mg/dL  Basic metabolic panel with GFR     Status: Abnormal    Collection Time: 08/26/23  5:50 AM  Result Value Ref Range    Sodium 138 135 - 145 mmol/L    Potassium 5.6 (H) 3.5 - 5.1 mmol/L    Chloride 108 98 -  111 mmol/L    CO2 24 22 - 32 mmol/L    Glucose, Bld 90 70 - 99 mg/dL    BUN 15 8 - 23 mg/dL    Creatinine, Ser 9.14 (H) 0.44 - 1.00 mg/dL    Calcium  8.4 (L) 8.9 - 10.3 mg/dL    GFR, Estimated >78 >29 mL/min    Anion gap 6 5 - 15  Cooxemetry Panel (carboxy, met, total hgb, O2 sat)     Status: Abnormal    Collection Time: 08/26/23  5:54 AM  Result Value Ref Range    Total hemoglobin 9.4 (L) 12.0 - 16.0 g/dL    O2 Saturation 56.2 %    Carboxyhemoglobin 1.8 (H) 0.5 - 1.5 %    Methemoglobin 0.7 0.0 - 1.5 %  Glucose, capillary     Status: Abnormal    Collection Time: 08/26/23  8:15 AM  Result Value Ref Range    Glucose-Capillary 194 (H) 70 - 99 mg/dL      Imaging Results (Last 48 hours)  No results found.     Assessment/Plan: Diagnosis: 63 year old female with debility after bacterial endocarditis, status post mitral valve replacement and multiple associated complications. Does the need for close, 24 hr/day medical supervision in concert with the patient's rehab needs make it unreasonable for this patient to be served in a less intensive setting? Probably not Co-Morbidities requiring supervision/potential complications:  - Infectious disease considerations -Recent cardiac shock, atrial flutter with RVR -Acute blood loss anemia -Elevated INR, need for anticoagulation due to mitral valve replacement -Diabetes -Morbid obesity Due to bladder management, bowel management, safety, skin/wound care, disease management, medication administration, pain management, and patient education, does the patient require 24 hr/day rehab nursing? Potentially Does the patient require coordinated care of a physician, rehab nurse, therapy disciplines of PT, OT to address physical and functional deficits in the context of the above medical diagnosis(es)? No Addressing deficits in the following areas: balance, endurance, locomotion, transferring, bowel/bladder control, dressing, feeding, toileting, and  psychosocial support Can the patient actively participate in an intensive therapy program of at least 3 hrs of therapy per day at least 5 days per week? Yes The potential for patient to make measurable gains while on inpatient rehab is fair Anticipated functional outcomes upon discharge from inpatient rehab are n/a  with PT, n/a with OT, n/a with SLP. Estimated rehab length of stay to reach the above functional goals is: n/a Anticipated discharge destination: Home Overall Rehab/Functional Prognosis: excellent   POST ACUTE RECOMMENDATIONS: This patient's condition is appropriate for continued rehabilitative care in the following setting: HH -->> Outpatient PT and OT Patient has agreed to participate in recommended program. Yes Note that insurance prior authorization may be required for reimbursement for recommended care.   Comment: Pt has a son at home who's going to take time off. She has friends who work as Chemical engineer who will help also.  Additionally, she walked 160' contact guard assist today. Will get up again later this afternoon. She can go home with follow up recommended above. She's in agreement.       I have personally performed a face to face diagnostic evaluation of this patient. Additionally, I have examined the patient's medical record including any pertinent labs and radiographic images.     Thanks,   Rawland Caddy, MD 08/26/2023

## 2023-08-29 NOTE — Progress Notes (Signed)
 CARDIAC REHAB PHASE I   Postop [facility] education completed. Referral for CRP2 sent to [facility]. Discharge to CIR today.   Ronny Colas, RN BSN 08/29/2023 11:51 AM

## 2023-08-29 NOTE — TOC Transition Note (Signed)
 Transition of Care [facility]) - Discharge Note   Patient Details  Name: Melissa Bray MRN: [id number] Date of Birth: Feb 21, 1961  Transition of Care [facility]) CM/SW Contact:  Benjiman Bras, RN Phone Number: [phone] 08/29/2023, 11:41 AM   Clinical Narrative:     Scheduled dc to CIR today, notified [employer] rep, [name] to make aware.   Final next level of care: IP Rehab Facility Barriers to Discharge: No Barriers Identified   Patient Goals and CMS Choice Patient states their goals for this hospitalization and ongoing recovery are:: wants to get back to her baseline CMS Medicare.gov Compare Post Acute Care list provided to:: Patient Choice offered to / list presented to : Patient      Discharge Placement                       Discharge Plan and Services Additional resources added to the After Visit Summary for     Discharge Planning Services: CM Consult Post Acute Care Choice: IP Rehab                      [facility] Agency: [employer] Date HH Agency Contacted: 08/18/23 Time HH Agency Contacted: [phone] Representative spoke with at [facility] Agency: Kay Parson RN, [employer] rep  Social Drivers of Health (SDOH) Interventions SDOH Screenings   Food Insecurity: No Food Insecurity (07/31/2023)  Housing: Low Risk  (07/31/2023)  Transportation Needs: No Transportation Needs (07/31/2023)  Utilities: Not At Risk (07/31/2023)  Depression (PHQ2-9): Low Risk  (06/27/2023)  Tobacco Use: Low Risk  (08/22/2023)     Readmission Risk Interventions     No data to display

## 2023-08-29 NOTE — Progress Notes (Signed)
 Inpatient Rehab Admissions Coordinator:  Received insurance authorization. There is a bed available for pt in CIR today. Debroah Fanning, PA-C aware and in agreement. Pt, NSG and TOC made aware.   Artemus Larsen, MS, CCC-SLP Admissions Coordinator [phone]

## 2023-08-29 NOTE — Progress Notes (Signed)
 Pt arrived to [id number] report received by Adell Hones, RN. Pt is stable and a/ox4.

## 2023-08-29 NOTE — Progress Notes (Addendum)
 [address]       [place] [address]             [phone]      7 Days Post-Op Procedure(s) (LRB): TRANSESOPHAGEAL ECHOCARDIOGRAM (N/A) CARDIOVERSION (N/A) Subjective: Patient sitting up in the chair eating breakfast, states she actually had an appetite this AM.   Objective: Vital signs in last 24 hours: Temp:  [98.6 F (37 C)-98.8 F (37.1 C)] 98.6 F (37 C) (05/29 2259) Pulse Rate:  [84-88] 84 (05/29 2259) Cardiac Rhythm: Normal sinus rhythm (05/29 1900) Resp:  [17-20] 17 (05/30 0642) BP: (135-156)/(64-101) 156/65 (05/29 2259) Weight:  [115.1 kg] 115.1 kg (05/30 0642)  Hemodynamic parameters for last 24 hours: CVP:  [2 mmHg-6 mmHg] 4 mmHg  Intake/Output from previous day: No intake/output data recorded. Intake/Output this shift: No intake/output data recorded.  General appearance: alert, cooperative, and no distress Neurologic: intact Heart: regular rate and rhythm, systolic murmur, no rubs Lungs: clear to auscultation bilaterally Abdomen: soft, non-tender; bowel sounds normal; no masses,  no organomegaly Extremities: edema trace BLE Wound: Inferior portion of sternal incision and chest tube sites with superficial dehiscence, improving. No sign of infection  Lab Results: Recent Labs    08/28/23 0430 08/29/23 0400  WBC 6.7 6.4  HGB 9.0* 9.3*  HCT 28.6* 29.3*  PLT 429* 404*   BMET:  Recent Labs    08/28/23 1253 08/29/23 0400  NA 138 138  K 4.1 3.9  CL 104 105  CO2 22 26  GLUCOSE 221* 90  BUN 12 11  CREATININE 1.11* 0.93  CALCIUM  9.5 8.9    PT/INR:  Recent Labs    08/29/23 0400  LABPROT 39.2*  INR 4.0*   ABG    Component Value Date/Time   PHART 7.465 (H) 08/14/2023 1700   HCO3 25.0 08/14/2023 1700   TCO2 26 08/14/2023 1700   ACIDBASEDEF 1.0 08/08/2023 1634   O2SAT 93.7 08/29/2023 0400   CBG (last 3)  Recent Labs    08/28/23 1632 08/28/23 2113 08/29/23 0622  GLUCAP 108* 142* 90    Assessment/Plan: S/P  Procedure(s) (LRB): TRANSESOPHAGEAL ECHOCARDIOGRAM (N/A) CARDIOVERSION (N/A)  CV: Postop PAF with RVR with successful TEE/DC-CV. NSR, HR 80s this AM. HTN, SBP 130s-150s since Entresto was started yesterday. On PO Amiodarone  200mg , Spironolactone  12.5mg  daily, Entresto 24-26mg  BID, and Coreg 3.125mg  BID. AHF team is following, appreciate their assistance with titration of GDMT.   Pulm: Saturating 100% on RA. Last CXR with bibasilar atelectasis. Encourage IS and ambulation   GI: +BM. Tolerating diet.    Endo: CBGs controlled on Semglee  30U BID, Novolog  5U with meals and AC/HS SSI PRN.   Renal: Cr 0.93, stable. On Torsemide 20mg  daily per AHF. No UO recorded. Under preop weight. K 3.9, will supplement today.   ID: Endocarditis, on Penicillin  G. ID recommending IB PCN G for 6 weeks starting from 05/14, end date of 06/25. They have signed off but will need to call at discharge to arrange outpatient follow up. Tmax 98.8, no leukocytosis.    Expected postop ABLA: H/H 9.3/29.2, stable. Not clinically significant at this time.    DVT Prophylaxis: On Coumadin    Deconditioning: Continue work with PT/OT. Plan for CIR at discharge.   Anticoagulation: On Coumadin , INR supratherapeutic at 4 after 3 days of holding and restarting 6mg  Coumadin  yesterday. Pharmacy managing coumadin , will likely require decreased dose today.    Sternal incision: Superficial dehiscence of sternal incision and chest tube sites.  Looks improved this AM. Continue to clean with vashe and continue daily dressing changes.   Dispo: [employer] authorization pending, stable for d/c to CIR when insurance authorization is complete  Addendum: Insurance approval is complete and CIR has a bed available. Will discharge to CIR. Pharmacy to continue managing coumadin . I have reached out to ID and they will make outpatient follow up and ID pharmacist to reorder IV PCN G while in CIR. We will continue to follow her while in CIR for sternal  dehiscence. Wet to dry dressing changes to be continued while in CIR.   LOS: 27 days    Randa Burton, PA-C 08/29/2023  Patient seen and examined, agree with findings and plan outlined above  Landon Pinion C. Luna Salinas, MD [facility] [phone]

## 2023-08-29 NOTE — Progress Notes (Signed)
 Nutrition Follow-up  DOCUMENTATION CODES:   Obesity unspecified  INTERVENTION:   Regular diet; encouraged small frequent meals and ok for family to bring in outside food/snacks if pt desires   Ensure Enlive po BID, each supplement provides 350 kcal and 20 grams of protein.   Add Magic cup BID with meals, each supplement provides 290 kcal and 9 grams of protein  Discontinue Banatrol as BM's have improved  NUTRITION DIAGNOSIS:   Inadequate oral intake related to acute illness as evidenced by NPO status. - improving  GOAL:   Patient will meet greater than or equal to 90% of their needs - progressing, addressing via meals and nutrition supplements  MONITOR:   Diet advancement, TF tolerance, Labs, Weight trends  REASON FOR ASSESSMENT:   Consult Enteral/tube feeding initiation and management  ASSESSMENT:   63 yo female admitted with sepsis with cellulitis of LUE, diagnosed with GBS bacteremia and MV endocarditis with consult to TCTS, developed flash pulmonary edema and shock requiring intubation and IABP. PMH includes HTN, HLD, breast cancer s/p left mastectomy, DM, GERD  Spoke with pt at bedside.  She reports plans for discharge to CIR today.   Pt states that she actually woke up hungry which has not been the case throughout admission. She had a cup of peaches and then cereal for breakfast. She states that her appetite has gradually been improving and mentions that she has been consuming >/=50% of 3 meals daily.  She continues to consume 1 ensure daily.   Pt mentions that she ordered vegetable soup for lunch today.  Encouraged pt to add some source of protein with each meal, suggestions provided, to aid in ongoing healing process and muscle maintenance especially with transfer to rehab.   Meal completions: 5/28: 50% breakfast, 75% lunch, 75% dinner  Admit weight: 120.7 kg Current weight: 115.1 kg  Medications: farxiga , SSI 0-15 units TID, SSI 0-5 units daily, SSI 3  units TID, semglee  25 units BID, protonix , torsemide , warfarin  Labs: reviewed  CBG's 90-157 x24 hours  Diet Order:   Diet Order             Diet regular Room service appropriate? Yes with Assist; Fluid consistency: Thin  Diet effective now                   EDUCATION NEEDS:   Not appropriate for education at this time  Skin:  Skin Assessment: Skin Integrity Issues: Skin Integrity Issues:: Stage II Stage II: R buttock Wound Vac: chest incision  Last BM:  5/29 type 4 small  Height:   Ht Readings from Last 1 Encounters:  08/10/23 5\' 5"  (1.651 m)    Weight:   Wt Readings from Last 1 Encounters:  08/29/23 115.1 kg   BMI:  Body mass index is 42.23 kg/m.  Estimated Nutritional Needs:   Kcal:  1700-1900 kcals  Protein:  120-140 gm  Fluid:  1.7 L  Rocklin Chute, RDN, LDN Clinical Nutrition See AMiON for contact information.

## 2023-08-29 NOTE — Progress Notes (Signed)
 Inpatient Rehabilitation Admission Medication Review by a Pharmacist  A complete drug regimen review was completed for this patient to identify any potential clinically significant medication issues.  High Risk Drug Classes Is patient taking? Indication by Medication  Antipsychotic No   Anticoagulant Yes Warfarin - mechanical valve  Antibiotic Yes, as an intravenous medication Penicillin  - endocarditis  Opioid Yes Oxycodone  prn severe pain Tramadol  prn moderate pain  Antiplatelet No   Hypoglycemics/insulin  Yes Insulin  - DM Dapaglifozin - DM, CHF  Vasoactive Medication Yes Amiodarone  - Afib Carvedilol, spironolactone , torsemide- CHF, BP  Chemotherapy Yes, Oral Chemotherapy Anastrozole  - breast cancer  Other Yes Clonazepam  - anxiety Pantoprazole  - reflux      Type of Medication Issue Identified Description of Issue Recommendation(s)  Drug Interaction(s) (clinically significant)     Duplicate Therapy     Allergy     No Medication Administration End Date     Incorrect Dose     Additional Drug Therapy Needed     Significant med changes from prior encounter (inform family/care partners about these prior to discharge).    Other       Clinically significant medication issues were identified that warrant physician communication and completion of prescribed/recommended actions by midnight of the next day:  No  Name of provider notified for urgent issues identified:   Provider Method of Notification:     Pharmacist comments: Warfarin per pharmacy - INR 4 on admission to rehab  Time spent performing this drug regimen review (minutes):  20 minutes  Thank you. Lennice Quivers, PharmD

## 2023-08-29 NOTE — Progress Notes (Signed)
 [facility]    Date of Admission:  07/31/2023   Total days of antibiotics day 2 since valve surgery   ID: Melissa Bray is a 63 y.o. female with group b strep endocarditis s/p mvr on 5/14 Principal Problem:   Mitral valve endocarditis Active Problems:   Essential hypertension   DM2 (diabetes mellitus, type 2) (HCC)   HLD (hyperlipidemia)   Severe sepsis (HCC)   Cellulitis of left upper extremity   Obesity, Class III, BMI 40-49.9 (morbid obesity)   Perforation of leaflet of mitral valve   GAD (generalized anxiety disorder)   History of breast cancer   Hypomagnesemia   Hypokalemia   Hyponatremia   Sinus tachycardia   Cardiogenic shock (HCC)   Shock (HCC)   S/P MVR (mitral valve replacement)   Physical deconditioning   Acute respiratory failure with hypoxia (HCC)   Acute bacterial endocarditis    Subjective: Doing well No abx side effect Going to cir  Medications:   amiodarone   200 mg Oral Daily   anastrozole   1 mg Oral Daily   carvedilol  3.125 mg Oral BID WC   Chlorhexidine  Gluconate Cloth  6 each Topical Daily   [facility], Patient & Family Education   Does not apply Once   dapagliflozin propanediol  10 mg Oral Daily   feeding supplement  237 mL Oral BID BM   insulin  aspart  0-15 Units Subcutaneous TID WC   insulin  aspart  0-5 Units Subcutaneous QHS   insulin  aspart  3 Units Subcutaneous TID WC   insulin  glargine-yfgn  25 Units Subcutaneous BID   pantoprazole   40 mg Oral Daily   sacubitril-valsartan  1 tablet Oral BID   sodium chloride  flush  10-40 mL Intracatheter Q12H   sodium chloride  flush  3 mL Intravenous Q12H   spironolactone   12.5 mg Oral Daily   torsemide  20 mg Oral Daily   Warfarin - Pharmacist Dosing Inpatient   Does not apply q1600    Objective: Vital signs in last 24 hours: Temp:  [97.6 F (36.4 C)-98.8 F (37.1 C)] 98.8 F (37.1 C) (05/30 1110) Pulse Rate:  [84-88] 88 (05/30 0744) Resp:   [17-25] 17 (05/30 1110) BP: (123-156)/(59-97) 143/97 (05/30 1110) SpO2:  [95 %-99 %] 95 % (05/30 1110) Weight:  [115.1 kg] 115.1 kg (05/30 1610)  Physical Exam  Constitutional:  oriented to person, place, and time. appears well-developed and well-nourished. No distress.  HENT: [place]/AT, PERRLA, no scleral icterus Mouth/Throat: Oropharynx is clear and moist. No oropharyngeal exudate.  Cardiovascular: Normal rate, regular rhythm and normal heart sounds. Exam reveals no gallop and no friction rub.  No murmur heard.  Chest wall = mediastinal chest tube in place Pulmonary/Chest: Effort normal and breath sounds normal. No respiratory distress.  has no wheezes.  Neck = supple, no nuchal rigidity. Catheter c/d/I covered Abdominal: Soft. Bowel sounds are normal.  exhibits no distension. There is no tenderness.  Lymphadenopathy: no cervical adenopathy. No axillary adenopathy Neurological: alert and oriented to person, place, and time.  Skin: Skin is warm and dry. No rash noted. No erythema.  Psychiatric: a normal mood and affect.  behavior is normal.    Lab Results Recent Labs    08/28/23 0430 08/28/23 1005 08/28/23 1253 08/29/23 0400  WBC 6.7  --   --  6.4  HGB 9.0*  --   --  9.3*  HCT 28.6*  --   --  29.3*  NA  --    < >  138 138  K  --    < > 4.1 3.9  CL  --    < > 104 105  CO2  --    < > 22 26  BUN  --    < > 12 11  CREATININE  --    < > 1.11* 0.93   < > = values in this interval not displayed.   Liver Panel No results for input(s): "PROT", "ALBUMIN ", "AST", "ALT", "ALKPHOS", "BILITOT", "BILIDIR", "IBILI" in the last 72 hours. Sedimentation Rate No results for input(s): "ESRSEDRATE" in the last 72 hours. C-Reactive Protein No results for input(s): "CRP" in the last 72 hours.  Microbiology: 5/14 tissue = ngtd 5/10 blood cx = ngtd 5/1 blod cx = group b strep Studies/Results: No results found.    Assessment/Plan: Group b strep mitral valve endocarditis s/p MV replacement on  5/14 = restarted antibiotic clock on 5/14 of 6 weeks pen g  5/30 assessment Tolerating pen-g No sign of chf or valve repair complication/failure clinically Patient to transfer to inpatient rehab  Reviewed labs -- nothing concerning with abx side effect  -please continue weekly cbc, cmp while on iv abx in CIR -pen g continuous daily infusion to go until 09/24/23 -id clinic f/u at [facility] clinic on 6/30 @ 930 -if patient remains in CIR at that date, please reengage the id inpatient team to revisit -maintain standard isolation precaution -id will sign off    Martie Slaughter [facility] Pager: [phone]  08/29/2023, 1:16 PM

## 2023-08-29 NOTE — H&P (Signed)
 Physical Medicine and Rehabilitation Admission H&P        Chief Complaint  Patient presents with   Fatigue  : HPI: Melissa Bray is a 63 year old right-handed female with history significant for type 2 diabetes mellitus, essential hypertension, hyperlipidemia, breast cancer status post left mastectomy 2016, class III morbid obesity BMI 42.67.  Per chart review patient lives with son.  1 level home 2 steps to enter.  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  Presented 07/31/2023 to [facility] for suspected sepsis due to left upper extremity cellulitis.  She was found to have significant edema to left upper extremity was treated for sepsis/cellulitis when a murmur was noted.  She was transferred to [facility] and a TEE was performed on 5/5 which demonstrated severe mitral regurgitation due to endocarditis and ejection fraction of 55 to 60%.  Patient also had extraction of teeth 5/8.  The following day she deteriorated went to cardiogenic shock.  She was intubated and intra-aortic balloon pump was placed and ultimately underwent mitral valve replacement 08/12/2023 per Dr. Luna Salinas and placed on Coumadin  therapy.  She underwent TEE and cardioversion on 08/22/2023.  Sternal precautions as indicated.  Both cardiothoracic surgery and heart failure team continue to follow.  She was placed on penicillin  G per infectious disease recommendations of 6 weeks initiated 08/13/2023 through 09/24/2023.  Evaluations completed due to patient's debility and multimedical issues was admitted for a comprehensive rehab program.   Review of Systems  Constitutional:  Negative for chills and fever.  HENT:  Negative for hearing loss.   Eyes:  Negative for double vision.  Respiratory:  Positive for cough and shortness of breath.   Cardiovascular:  Positive for leg swelling. Negative for chest pain and palpitations.  Gastrointestinal:  Positive for constipation. Negative for heartburn, nausea and vomiting.   Genitourinary:  Negative for dysuria, flank pain and hematuria.  Musculoskeletal:  Positive for joint pain and myalgias.  Skin:  Negative for rash.  Neurological:  Positive for weakness.  Psychiatric/Behavioral:         Anxiety  All other systems reviewed and are negative.       Past Medical History:  Diagnosis Date   Anxiety     Breast cancer (HCC)     Diabetes mellitus without complication (HCC)     Hyperlipidemia     Hypertension     Type 2 diabetes mellitus without retinopathy (HCC) 06/17/2017             Past Surgical History:  Procedure Laterality Date   ABDOMINAL HYSTERECTOMY       BREAST BIOPSY       CARDIOVERSION N/A 08/22/2023    Procedure: CARDIOVERSION;  Surgeon: Lauralee Poll, MD;  Location: [facility] INVASIVE CV LAB;  Service: Cardiovascular;  Laterality: N/A;   CLIPPING OF ATRIAL APPENDAGE   08/13/2023    Procedure: CLIPPING, LEFT ATRIAL APPENDAGE USING ATRICURE LAA EXCLUSION SYSTEM SIZE 40;  Surgeon: [name], MD;  Location: [facility] OR;  Service: Open Heart Surgery;;   IABP INSERTION N/A 08/08/2023    Procedure: IABP Insertion;  Surgeon: Swaziland, Peter M, MD;  Location: [facility];  Service: Cardiovascular;  Laterality: N/A;   INTRAOPERATIVE TRANSESOPHAGEAL ECHOCARDIOGRAM N/A 08/13/2023    Procedure: ECHOCARDIOGRAM, TRANSESOPHAGEAL, INTRAOPERATIVE;  Surgeon: [name], MD;  Location: [facility] OR;  Service: Open Heart Surgery;  Laterality: N/A;   MASTECTOMY Left 2016   MITRAL VALVE REPLACEMENT N/A 08/13/2023    Procedure: MITRAL VALVE  REPLACEMENT USING ON-X PROSTHETIC MITRAL HEART VALVE SIZE 31/33MM;  Surgeon: [name], MD;  Location: [facility] OR;  Service: Open Heart Surgery;  Laterality: N/A;   RIGHT/LEFT HEART CATH AND CORONARY ANGIOGRAPHY N/A 08/08/2023    Procedure: RIGHT/LEFT HEART CATH AND CORONARY ANGIOGRAPHY;  Surgeon: Swaziland, Peter M, MD;  Location: [facility] INVASIVE CV LAB;  Service: Cardiovascular;  Laterality: N/A;   TOOTH EXTRACTION N/A  08/07/2023    Procedure: EXTRACTION TEETH NUMBERS ELEVEN, THIRTEEN, AND FOURTEEN;  Surgeon: Ascencion Lava, DMD;  Location: [facility] OR;  Service: Oral Surgery;  Laterality: N/A;   TRANSESOPHAGEAL ECHOCARDIOGRAM (CATH LAB) N/A 08/04/2023    Procedure: TRANSESOPHAGEAL ECHOCARDIOGRAM;  Surgeon: Hazle Lites, MD;  Location: [facility];  Service: Cardiovascular;  Laterality: N/A;   TRANSESOPHAGEAL ECHOCARDIOGRAM (CATH LAB) N/A 08/22/2023    Procedure: TRANSESOPHAGEAL ECHOCARDIOGRAM;  Surgeon: Lauralee Poll, MD;  Location: [facility] INVASIVE CV LAB;  Service: Cardiovascular;  Laterality: N/A;             Family History  Problem Relation Age of Onset   Asthma Mother     Cancer Father          prostate cancer   Diabetes Father     Hypertension Father     Cancer Brother          prostate cancer   Asthma Brother     Colon cancer Neg Hx     Colon polyps Neg Hx     Crohn's disease Neg Hx     Esophageal cancer Neg Hx     Rectal cancer Neg Hx     Stomach cancer Neg Hx     Ulcerative colitis Neg Hx          Social History:  reports that she has never smoked. She has never been exposed to tobacco smoke. She has never used smokeless tobacco. She reports that she does not drink alcohol and does not use drugs. Allergies:  Allergies  No Known Allergies         Medications Prior to Admission  Medication Sig Dispense Refill   alendronate  (FOSAMAX ) 70 MG tablet Take 1 tablet (70 mg total) by mouth once a week. Take with a full glass of water on an empty stomach. 12 tablet 3   amLODipine  (NORVASC ) 10 MG tablet Take 1 tablet (10 mg total) by mouth at bedtime. (Patient taking differently: Take 20 mg by mouth at bedtime.) 90 tablet 1   anastrozole  (ARIMIDEX ) 1 MG tablet Take 1 mg by mouth daily.       cloNIDine  (CATAPRES ) 0.1 MG tablet Take 1 tablet (0.1 mg total) by mouth 2 (two) times daily. 180 tablet 3   glipiZIDE  (GLUCOTROL  XL) 10 MG 24 hr tablet Take 1 tablet (10 mg total) by mouth daily with  breakfast. 90 tablet 3   insulin  glargine (LANTUS ) 100 UNIT/ML Solostar Pen Inject 20 Units into the skin daily. 3 mL 0   lisinopril  (ZESTRIL ) 40 MG tablet Take 1 tablet (40 mg total) by mouth daily. 90 tablet 3   metFORMIN  (GLUCOPHAGE ) 850 MG tablet Take 1 tablet (850 mg total) by mouth 2 (two) times daily with a meal. 60 tablet 0   metoprolol  succinate (TOPROL -XL) 100 MG 24 hr tablet Take 1 tablet (100 mg total) by mouth daily. 90 tablet 3   pravastatin  (PRAVACHOL ) 20 MG tablet Take 1 tablet (20 mg total) by mouth daily. 90 tablet 3   Vitamin D , Ergocalciferol , (DRISDOL ) 1.25 MG (50000 UNIT)  CAPS capsule TAKE 1 CAPSULE ONCE A WEEK FOR 12 WEEKS. AFTER 12 WEEKS SWITCH TO OVER THE COUNTER VITAMIN D  2000 IU 12 capsule 0   Continuous Glucose Sensor (FREESTYLE LIBRE 3 PLUS SENSOR) MISC Inject 1 Units into the skin every 14 (fourteen) days. Change sensor every 15 days. 2 each 11   Insulin  Pen Needle (PEN NEEDLES) 31G X 6 MM MISC 1 application  by Does not apply route at bedtime. 90 each 3              Home: Home Living Family/patient expects to be discharged to:: Private residence Living Arrangements: Children Available Help at Discharge: Family, Available 24 hours/day Type of Home: House Home Access: Stairs to enter [employer] of Steps: 2 Entrance Stairs-Rails: None Home Layout: One level Bathroom Shower/Tub: Tub/shower unit, Engineer, building services: Pharmacist, community: Yes Home Equipment: None Additional Comments: 24/7 between son and caretaker  Lives With: Son   Functional History: Prior Function Prior Level of Function : Independent/Modified Independent, Driving Mobility Comments: independent without AD, no falls in past 6 months but reports balance has been off for ~5 days; 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: independent   Functional Status:  Mobility: Bed Mobility Overal bed mobility: Needs Assistance Bed Mobility: Supine to Sit Rolling: Contact  guard assist Sidelying to sit: Min assist Supine to sit: Min assist Sit to sidelying: Min assist General bed mobility comments: OOB in recliner Transfers Overall transfer level: Needs assistance Equipment used: Rolling walker (2 wheels) Transfers: Sit to/from Stand Sit to Stand: Contact guard assist Bed to/from chair/wheelchair/BSC transfer type:: Step pivot Step pivot transfers: Min assist, Contact guard assist General transfer comment: uses momentum, cues for hands on knees or across chest Ambulation/Gait Ambulation/Gait assistance: Contact guard assist Gait Distance (Feet): 240 Feet Assistive device: Rolling walker (2 wheels) Gait Pattern/deviations: Step-through pattern, Decreased stride length General Gait Details: slow, steady gait with RW Gait velocity: decreased Gait velocity interpretation: <1.31 ft/sec, indicative of household ambulator   ADL: ADL Overall ADL's : Needs assistance/impaired Eating/Feeding: Set up, Bed level Grooming: Wash/dry hands, Wash/dry face, Oral care, Set up, Sitting Grooming Details (indicate cue type and reason): at sink Upper Body Bathing: Set up, Sitting Upper Body Bathing Details (indicate cue type and reason): at sink Lower Body Bathing: Moderate assistance, Sit to/from stand Lower Body Bathing Details (indicate cue type and reason): bathed peri area front seated and stood for peri area back Upper Body Dressing : Minimal assistance, Sitting Upper Body Dressing Details (indicate cue type and reason): to change gowns Lower Body Dressing: Moderate assistance, Sit to/from stand Lower Body Dressing Details (indicate cue type and reason): able to doff socks but required assistance to get over toes and patient able to complete while in figure 4 Toilet Transfer: Minimal assistance, BSC/3in1 Toilet Transfer Details (indicate cue type and reason): able to transfer from EOB to [facility] with min assist Toileting- Clothing Manipulation and Hygiene: Sit  to/from stand, Minimal assistance Toileting - Clothing Manipulation Details (indicate cue type and reason): able to perform toilet hygiene while standing with min assist for clothing management Functional mobility during ADLs: Minimal assistance, Rolling walker (2 wheels) General ADL Comments: continues to require momentum and min assist to stand   Cognition: Cognition Orientation Level: Oriented X4 Cognition Arousal: Alert Behavior During Therapy: WFL for tasks assessed/performed   Physical Exam: Blood pressure (!) 156/65, pulse 84, temperature 98.6 F (37 C), temperature source Oral, resp. rate 20, height 5\' 5"  (1.651 m),  weight 116.3 kg, SpO2 100%. Physical Exam Constitutional:      General: She is not in acute distress.    Appearance: She is obese. She is not ill-appearing.  HENT:     Head: Normocephalic and atraumatic.     Nose: Nose normal.     Mouth/Throat:     Mouth: Mucous membranes are moist.  Eyes:     Extraocular Movements: Extraocular movements intact.     Conjunctiva/sclera: Conjunctivae normal.     Pupils: Pupils are equal, round, and reactive to light.  Cardiovascular:     Rate and Rhythm: Normal rate and regular rhythm.     Heart sounds: Murmur heard.  Pulmonary:     Effort: Pulmonary effort is normal. No respiratory distress.     Breath sounds: No wheezing, rhonchi or rales.  Abdominal:     General: Bowel sounds are normal. There is no distension.     Palpations: Abdomen is soft.     Tenderness: There is no abdominal tenderness.  Musculoskeletal:     Cervical back: Normal range of motion.     Right lower leg: Edema present.     Left lower leg: Edema present.  Skin:    General: Skin is warm and dry.     Comments: Old left mastectomy scar  Neurological:     Comments: Alert and oriented x 3. Normal insight and awareness. Intact Memory. Normal language and speech. Cranial nerve exam unremarkable. MMT: BUE motor 4+ to 5/5 prox to distal. BLE: 4 HF, KE and 4+  ADF/PF. Did not appreciate any focal sensory loss or abnl tone. DTR's 1+. No ataxia.    Psychiatric:        Mood and Affect: Mood normal.        Behavior: Behavior normal.        Lab Results Last 48 Hours        Results for orders placed or performed during the hospital encounter of 07/31/23 (from the past 48 hours)  Cooxemetry Panel (carboxy, met, total hgb, O2 sat)     Status: Abnormal    Collection Time: 08/27/23  5:30 AM  Result Value Ref Range    Total hemoglobin 9.9 (L) 12.0 - 16.0 g/dL    O2 Saturation 56.2 %    Carboxyhemoglobin 1.4 0.5 - 1.5 %    Methemoglobin <0.7 0.0 - 1.5 %      Comment: Performed at [facility], [address], [place], [place] [address]  Glucose, capillary     Status: None    Collection Time: 08/27/23  5:59 AM  Result Value Ref Range    Glucose-Capillary 73 70 - 99 mg/dL      Comment: Glucose reference range applies only to samples taken after fasting for at least 8 hours.    Comment 1 Notify RN      Comment 2 Document in Chart    Glucose, capillary     Status: Abnormal    Collection Time: 08/27/23 11:23 AM  Result Value Ref Range    Glucose-Capillary 225 (H) 70 - 99 mg/dL      Comment: Glucose reference range applies only to samples taken after fasting for at least 8 hours.  Glucose, capillary     Status: Abnormal    Collection Time: 08/27/23  4:21 PM  Result Value Ref Range    Glucose-Capillary 67 (L) 70 - 99 mg/dL      Comment: Glucose reference range applies only to samples taken after fasting  for at least 8 hours.  Glucose, capillary     Status: None    Collection Time: 08/27/23  4:41 PM  Result Value Ref Range    Glucose-Capillary 78 70 - 99 mg/dL      Comment: Glucose reference range applies only to samples taken after fasting for at least 8 hours.  Glucose, capillary     Status: Abnormal    Collection Time: 08/27/23  5:00 PM  Result Value Ref Range    Glucose-Capillary 102 (H) 70 - 99 mg/dL      Comment: Glucose reference range  applies only to samples taken after fasting for at least 8 hours.  Glucose, capillary     Status: Abnormal    Collection Time: 08/27/23  9:04 PM  Result Value Ref Range    Glucose-Capillary 120 (H) 70 - 99 mg/dL      Comment: Glucose reference range applies only to samples taken after fasting for at least 8 hours.  Protime-INR     Status: Abnormal    Collection Time: 08/28/23  4:30 AM  Result Value Ref Range    Prothrombin Time 38.5 (H) 11.4 - 15.2 seconds    INR 3.9 (H) 0.8 - 1.2      Comment: (NOTE) INR goal varies based on device and disease states. Performed at [facility], [address], [place], [place] [address]    CBC     Status: Abnormal    Collection Time: 08/28/23  4:30 AM  Result Value Ref Range    WBC 6.7 4.0 - 10.5 K/uL    RBC 3.14 (L) 3.87 - 5.11 MIL/uL    Hemoglobin 9.0 (L) 12.0 - 15.0 g/dL    HCT 91.4 (L) 78.2 - 46.0 %    MCV 91.1 80.0 - 100.0 fL    MCH 28.7 26.0 - 34.0 pg    MCHC 31.5 30.0 - 36.0 g/dL    RDW 95.6 21.3 - 08.6 %    Platelets 429 (H) 150 - 400 K/uL    nRBC 0.0 0.0 - 0.2 %      Comment: Performed at [facility], [address], [place], [place] [address]  Magnesium      Status: None    Collection Time: 08/28/23  4:30 AM  Result Value Ref Range    Magnesium  1.8 1.7 - 2.4 mg/dL      Comment: Performed at [facility], [address] [place], [place] [address]  Cooxemetry Panel (carboxy, met, total hgb, O2 sat)     Status: Abnormal    Collection Time: 08/28/23  5:30 AM  Result Value Ref Range    Total hemoglobin 9.7 (L) 12.0 - 16.0 g/dL    O2 Saturation 28.4 %    Carboxyhemoglobin 2.8 (H) 0.5 - 1.5 %    Methemoglobin 1.2 0.0 - 1.5 %      Comment: Performed at [facility], [address] [place], [place] [address]  Glucose, capillary     Status: Abnormal    Collection Time: 08/28/23  5:55 AM  Result Value Ref Range    Glucose-Capillary 103 (H) 70 - 99 mg/dL      Comment: Glucose reference range applies only to  samples taken after fasting for at least 8 hours.  Glucose, capillary     Status: Abnormal    Collection Time: 08/28/23  8:43 AM  Result Value Ref Range    Glucose-Capillary 148 (H) 70 - 99 mg/dL  Comment: Glucose reference range applies only to samples taken after fasting for at least 8 hours.  Basic metabolic panel     Status: Abnormal    Collection Time: 08/28/23 10:05 AM  Result Value Ref Range    Sodium 137 135 - 145 mmol/L    Potassium 5.1 3.5 - 5.1 mmol/L    Chloride 105 98 - 111 mmol/L    CO2 25 22 - 32 mmol/L    Glucose, Bld 144 (H) 70 - 99 mg/dL      Comment: Glucose reference range applies only to samples taken after fasting for at least 8 hours.    BUN 11 8 - 23 mg/dL    Creatinine, Ser 1.61 0.44 - 1.00 mg/dL    Calcium  8.9 8.9 - 10.3 mg/dL    GFR, Estimated >09 >60 mL/min      Comment: (NOTE) Calculated using the CKD-EPI Creatinine Equation (2021)      Anion gap 7 5 - 15      Comment: Performed at [facility], [address] [place], [place] [address]  Glucose, capillary     Status: None    Collection Time: 08/28/23 11:49 AM  Result Value Ref Range    Glucose-Capillary 95 70 - 99 mg/dL      Comment: Glucose reference range applies only to samples taken after fasting for at least 8 hours.  Basic metabolic panel with GFR     Status: Abnormal    Collection Time: 08/28/23 12:53 PM  Result Value Ref Range    Sodium 138 135 - 145 mmol/L    Potassium 4.1 3.5 - 5.1 mmol/L    Chloride 104 98 - 111 mmol/L    CO2 22 22 - 32 mmol/L    Glucose, Bld 221 (H) 70 - 99 mg/dL      Comment: Glucose reference range applies only to samples taken after fasting for at least 8 hours.    BUN 12 8 - 23 mg/dL    Creatinine, Ser 8.11 (H) 0.44 - 1.00 mg/dL    Calcium  9.5 8.9 - 10.3 mg/dL    GFR, Estimated 56 (L) >60 mL/min      Comment: (NOTE) Calculated using the CKD-EPI Creatinine Equation (2021)      Anion gap 12 5 - 15      Comment: Performed at [facility],  [address], [place], [place] [address]  Glucose, capillary     Status: Abnormal    Collection Time: 08/28/23  4:32 PM  Result Value Ref Range    Glucose-Capillary 108 (H) 70 - 99 mg/dL      Comment: Glucose reference range applies only to samples taken after fasting for at least 8 hours.  Glucose, capillary     Status: Abnormal    Collection Time: 08/28/23  9:13 PM  Result Value Ref Range    Glucose-Capillary 142 (H) 70 - 99 mg/dL      Comment: Glucose reference range applies only to samples taken after fasting for at least 8 hours.    Comment 1 Notify RN      Comment 2 Document in Chart    Protime-INR     Status: Abnormal    Collection Time: 08/29/23  4:00 AM  Result Value Ref Range    Prothrombin Time 39.2 (H) 11.4 - 15.2 seconds    INR 4.0 (H) 0.8 - 1.2      Comment: (NOTE) INR goal varies based on device and disease states.  Performed at [facility], [address] [place], [place] [address]    Cooxemetry Panel (carboxy, met, total hgb, O2 sat)     Status: Abnormal    Collection Time: 08/29/23  4:00 AM  Result Value Ref Range    Total hemoglobin 10.1 (L) 12.0 - 16.0 g/dL    O2 Saturation 32.4 %    Carboxyhemoglobin 1.8 (H) 0.5 - 1.5 %    Methemoglobin <0.7 0.0 - 1.5 %      Comment: Performed at [facility], [address] [place], [place] [address]  CBC     Status: Abnormal    Collection Time: 08/29/23  4:00 AM  Result Value Ref Range    WBC 6.4 4.0 - 10.5 K/uL    RBC 3.25 (L) 3.87 - 5.11 MIL/uL    Hemoglobin 9.3 (L) 12.0 - 15.0 g/dL    HCT 72.5 (L) 36.6 - 46.0 %    MCV 90.2 80.0 - 100.0 fL    MCH 28.6 26.0 - 34.0 pg    MCHC 31.7 30.0 - 36.0 g/dL    RDW 44.0 34.7 - 42.5 %    Platelets 404 (H) 150 - 400 K/uL    nRBC 0.0 0.0 - 0.2 %      Comment: Performed at [facility], [address], [place], [place] [address]  Magnesium      Status: None    Collection Time: 08/29/23  4:00 AM  Result Value Ref Range    Magnesium  2.3 1.7 - 2.4 mg/dL       Comment: Performed at [facility], [address] [place], [place] [address]  Basic metabolic panel with GFR     Status: None    Collection Time: 08/29/23  4:00 AM  Result Value Ref Range    Sodium 138 135 - 145 mmol/L    Potassium 3.9 3.5 - 5.1 mmol/L    Chloride 105 98 - 111 mmol/L    CO2 26 22 - 32 mmol/L    Glucose, Bld 90 70 - 99 mg/dL      Comment: Glucose reference range applies only to samples taken after fasting for at least 8 hours.    BUN 11 8 - 23 mg/dL    Creatinine, Ser 3.29 0.44 - 1.00 mg/dL    Calcium  8.9 8.9 - 10.3 mg/dL    GFR, Estimated >51 >88 mL/min      Comment: (NOTE) Calculated using the CKD-EPI Creatinine Equation (2021)      Anion gap 7 5 - 15      Comment: Performed at [facility], [address] [place], [place] [address]      Imaging Results (Last 48 hours)  No results found.         Blood pressure (!) 156/65, pulse 84, temperature 98.6 F (37 C), temperature source Oral, resp. rate 20, height 5\' 5"  (1.651 m), weight 116.3 kg, SpO2 100%.   Medical Problem List and Plan: 1. Functional deficits secondary to debility after bacterial endocarditis/status post mitral valve replacement 08/12/2023 with multiple associated complications.   -Sternal precautions             -patient may shower             -ELOS/Goals: 7-10 days, mod I to supervision goals.  2.  Antithrombotics: -DVT/anticoagulation:  Pharmaceutical: Coumadin .  INR range 2.5-3.5 -monitor labs daily, pharmacy protocol             -  antiplatelet therapy: N/A 3. Pain Management: Oxycodone  as needed, tramadol  4. Mood/Behavior/Sleep: Klonopin  0.5 mg twice daily as needed anxiety, melatonin 5 mg nightly as needed             -antipsychotic agents: N/A 5. Neuropsych/cognition: This patient is capable of making decisions on her own behalf. 6. Skin/Wound Care: Routine skin checks 7. Fluids/Electrolytes/Nutrition: Routine in and outs with follow-up chemistries 8.  Acute blood loss  anemia.  Follow-up CBC 9.  ID/bacterial endocarditis.  Penicillin  G initiated 08/13/2023.  Continue through 09/24/2023.   - weekly labs for monitoring -Follow-up with infectious disease as outpt --[facility] 09/29/23 @0930  10.  Hypertension.  Coreg 3.125 mg twice daily, Entresto 24-26 mg twice daily, Aldactone  12.5 mg daily, Demadex 20 mg daily, Farxiga 10 mg daily 11.  Atrial flutter with RVR.  Status post DCCV 5/23.  Maintaining normal sinus rhythm.  Amiodarone  200 mg daily             -rate well controlled at present 12.  Diabetes mellitus.  Hemoglobin A1c 12.8.  Currently on NovoLog  3 units 3 times daily with meals, Semglee  25 units twice daily.  Prior to admission patient on Glucotrol  XL 10 mg daily, Lantus  insulin  20 units daily.   -reasonable control at present -Resume as needed 13.  History of breast cancer status post left mastectomy 2016.  Continue Arimidex  and follow-up outpatient 14.  Class III morbid obesity.  BMI 42.67.  Dietary follow-up   Sterling Eisenmenger, PA-C 08/29/2023  I have personally performed a face to face diagnostic evaluation of this patient and formulated the key components of the plan.  Additionally, I have personally reviewed laboratory data, imaging studies, as well as relevant notes and concur with the physician assistant's documentation above.  The patient's status has not changed from the original H&P.  Any changes in documentation from the acute care chart have been noted above.  Rawland Caddy, MD, [place]

## 2023-08-29 NOTE — Progress Notes (Signed)
 Physical Therapy Treatment Patient Details Name: Melissa Bray MRN: [id number] DOB: 28-Oct-1960 Today's Date: 08/29/2023   History of Present Illness 63 y.o. female presents to [facility] 07/31/23 from [place]. Originally diagnosed with sepsis 2/2 LUE cellulitis with transfer to [facility] after murmur found. Pt with severe mitral regurgitation 2/2 endocarditis. 5/5 TEE. 5/8 extraction of teeth. 5/9 quick deterioration 2/2 flash pulmonary edema and cardiogenic shock before LHC was performed. 5/9-5/12 intubated. 5/9-5/15 IABP. 5/13 MVR. 5/23 cardioversion and TEE. PMHx: T2DM, HTN, obesity, GAD, sinus tachycardia    PT Comments  Pt up in chair on arrival and eager for mobility. Pt demonstrating continued progress towards acute goals, increased gait cadence this session with RW for support and grossly CGA for safety. Pt continues to benefit from cues for sternal precaution adherence with stand>sit. Pt up in chair at end of session with all needs met. Pt continues to benefit from skilled PT services to progress toward functional mobility goals.     If plan is discharge home, recommend the following: A lot of help with bathing/dressing/bathroom;Assistance with cooking/housework;Assist for transportation;Help with stairs or ramp for entrance;A little help with walking and/or transfers   Can travel by private vehicle        Equipment Recommendations  Rolling walker (2 wheels);BSC/3in1    Recommendations for Other Services       Precautions / Restrictions Precautions Precautions: Sternal;Fall Precaution Booklet Issued: Yes (comment) Recall of Precautions/Restrictions: Intact Precaution/Restrictions Comments: good recall of precautions Restrictions Weight Bearing Restrictions Per Provider Order: Yes Other Position/Activity Restrictions: sternal     Mobility  Bed Mobility Overal bed mobility: Needs Assistance             General bed mobility comments: OOB in recliner    Transfers Overall  transfer level: Needs assistance Equipment used: Rolling walker (2 wheels) Transfers: Sit to/from Stand Sit to Stand: Contact guard assist           General transfer comment: uses momentum, cues for hands on knees when returning to sit    Ambulation/Gait Ambulation/Gait assistance: Contact guard assist Gait Distance (Feet): 240 Feet Assistive device: Rolling walker (2 wheels) Gait Pattern/deviations: Step-through pattern, Decreased stride length Gait velocity: decreased     General Gait Details: slow, steady gait with RW, slightly increased cadence this session   Stairs             Wheelchair Mobility     Tilt Bed    Modified Rankin (Stroke Patients Only)       Balance Overall balance assessment: Needs assistance Sitting-balance support: No upper extremity supported, Feet supported Sitting balance-Leahy Scale: Good     Standing balance support: No upper extremity supported, Bilateral upper extremity supported, During functional activity Standing balance-Leahy Scale: Fair Standing balance comment: able to stand at sink with no UE support                            Communication Communication Communication: No apparent difficulties Factors Affecting Communication: Reduced clarity of speech (soft spoken)  Cognition Arousal: Alert Behavior During Therapy: WFL for tasks assessed/performed   PT - Cognitive impairments: No apparent impairments                       PT - Cognition Comments: A&Ox4, soft spoken, noted fatigue but voiced appreciation of therapy Following commands: Intact Following commands impaired: Only follows one step commands consistently, Follows multi-step commands inconsistently  Cueing Cueing Techniques: Verbal cues  Exercises      General Comments General comments (skin integrity, edema, etc.): VSS in RA      Pertinent Vitals/Pain Pain Assessment Pain Assessment: No/denies pain    Home Living                           Prior Function            PT Goals (current goals can now be found in the care plan section) Acute Rehab PT Goals Patient Stated Goal: independence PT Goal Formulation: With patient Time For Goal Achievement: 08/31/23 Progress towards PT goals: Progressing toward goals    Frequency    Min 3X/week      PT Plan      Co-evaluation              AM-PAC PT "6 Clicks" Mobility   Outcome Measure  Help needed turning from your back to your side while in a flat bed without using bedrails?: A Little Help needed moving from lying on your back to sitting on the side of a flat bed without using bedrails?: A Lot Help needed moving to and from a bed to a chair (including a wheelchair)?: A Little Help needed standing up from a chair using your arms (e.g., wheelchair or bedside chair)?: A Little Help needed to walk in hospital room?: A Little Help needed climbing 3-5 steps with a railing? : Total 6 Click Score: 15    End of Session Equipment Utilized During Treatment: Gait belt Activity Tolerance: Patient tolerated treatment well Patient left: in chair;with call bell/phone within reach Nurse Communication: Mobility status PT Visit Diagnosis: Unsteadiness on feet (R26.81);Other abnormalities of gait and mobility (R26.89);Muscle weakness (generalized) (M62.81)     Time: 1610-9604 PT Time Calculation (min) (ACUTE ONLY): 13 min  Charges:    $Therapeutic Exercise: 8-22 mins PT General Charges $$ ACUTE PT VISIT: 1 Visit                     Melissa Bray R. PTA Acute Rehabilitation Services Office: [phone]   Agapito Horseman 08/29/2023, 11:51 AM

## 2023-08-30 DIAGNOSIS — R5381 Other malaise: Secondary | ICD-10-CM | POA: Diagnosis not present

## 2023-08-30 LAB — GLUCOSE, CAPILLARY
Glucose-Capillary: 87 mg/dL (ref 70–99)
Glucose-Capillary: 130 mg/dL — ABNORMAL HIGH (ref 70–99)
Glucose-Capillary: 65 mg/dL — ABNORMAL LOW (ref 70–99)
Glucose-Capillary: 106 mg/dL — ABNORMAL HIGH (ref 70–99)
Glucose-Capillary: 181 mg/dL — ABNORMAL HIGH (ref 70–99)

## 2023-08-30 LAB — PROTIME-INR
INR: 3.3 — ABNORMAL HIGH (ref 0.8–1.2)
Prothrombin Time: 34 s — ABNORMAL HIGH (ref 11.4–15.2)

## 2023-08-30 MED ORDER — ORAL CARE MOUTH RINSE: 15.0000 mL | OROMUCOSAL | Status: DC | PRN

## 2023-08-30 MED ORDER — WARFARIN SODIUM 5 MG PO TABS
5.0000 mg | ORAL_TABLET | Freq: Once | ORAL | Status: AC
  Administered 2023-08-30: 5 mg via ORAL
  Filled 2023-08-30: qty 1

## 2023-08-30 MED ORDER — CHLORHEXIDINE GLUCONATE CLOTH 2 % EX PADS
6.0000 | MEDICATED_PAD | Freq: Two times a day (BID) | CUTANEOUS | Status: DC
  Administered 2023-08-31 – 2023-09-05 (×10): 6 via TOPICAL

## 2023-08-30 MED ORDER — INSULIN GLARGINE-YFGN 100 UNIT/ML ~~LOC~~ SOLN
22.0000 [IU] | Freq: Two times a day (BID) | SUBCUTANEOUS | Status: DC
  Administered 2023-08-30 – 2023-09-01 (×4): 22 [IU] via SUBCUTANEOUS
  Filled 2023-08-30 (×5): qty 0.22

## 2023-08-30 MED ORDER — CHLORHEXIDINE GLUCONATE CLOTH 2 % EX PADS
6.0000 | MEDICATED_PAD | CUTANEOUS | Status: AC
  Administered 2023-08-30: 6 via TOPICAL

## 2023-08-30 NOTE — Progress Notes (Signed)
 Physical Therapy Session Note  Patient Details  Name: Lamona Eimer MRN: [id number] Date of Birth: 1960/08/16  Today's Date: 08/30/2023 PT Individual Time: 1445-1530 PT Individual Time Calculation (min): 45 min   Short Term Goals: Week 1:  PT Short Term Goal 1 (Week 1): none d/t ELOS  Skilled Therapeutic Interventions/Progress Updates:  Pt feeling quite fatigued at time of scheduled session, but agreeable to participate at later time. ON therapist return, pt sitting up ready for therapy. No complaint of pain. Pt with IV running at this time, so therapist/family member managed IV pole throughout session.    6 Min Walk Test:  Instructed patient to ambulate as quickly and as safely as possible for 6 minutes using LRAD. Patient was allowed to take standing rest breaks without stopping the test, but if the patient required a sitting rest break the clock would be stopped and the test would be over.  Results: 318 feet (106 meters, Avg speed 0.29 m/s) using a RW with CGA. Results indicate that the patient has reduced endurance with ambulation compared to age matched norms.  Age Matched Norms: 56-69 yo M: 2 F: 40, 58-79 yo M: 69 F: 471, 28-89 yo M: 417 F: 392 MDC: 58.21 meters (190.98 feet) or 50 meters (ANPTA Core Set of Outcome Measures for Adults with Neurologic Conditions, 2018)   Pt ambulated total for 407 ft including return to room to use bathroom. supervision for 3/3 toileting tasks. Continent   BM void, documented in flowsheet.  Pt then participated in 2 x 6 Sit to stand for endurance and functional mobility. CGA to close supervision for Sit to stand to RW. Cued pt for listening to her body for length of rest breaks. Pt instructed for 3 sets, but politely declined 3rd set d/t fatigue.   Pt concerned about constipation, discussed hydration and activity for holistic constipation management. Pt expressed under standing. Remained in recliner with needs in reach.       Therapy  Documentation Precautions:  Precautions Precautions: Sternal, Fall Precaution Booklet Issued: Yes (comment) Recall of Precautions/Restrictions: Intact Precaution/Restrictions Comments: good recall of precautions Restrictions Weight Bearing Restrictions Per Provider Order: Yes RUE Weight Bearing Per Provider Order: Non weight bearing (secondary to sternal precaution.) LUE Weight Bearing Per Provider Order: Non weight bearing RLE Weight Bearing Per Provider Order: Weight bearing as tolerated LLE Weight Bearing Per Provider Order: Weight bearing as tolerated Other Position/Activity Restrictions: sternal General:       Therapy/Group: Individual Therapy  Solina Heron C Oliviah Agostini 08/30/2023, 3:07 PM

## 2023-08-30 NOTE — Evaluation (Signed)
 Occupational Therapy Assessment and Plan  Patient Details  Name: Melissa Bray MRN: [id number] Date of Birth: 15-Sep-1960  OT Diagnosis: muscle weakness (generalized) Rehab Potential: Rehab Potential (ACUTE ONLY): Good ELOS: 7-10days   Today's Date: 08/30/2023 OT Individual Time: 1000-1120 OT Individual Time Calculation (min): 80 min     Hospital Problem: Principal Problem:   Debility   Past Medical History:  Past Medical History:  Diagnosis Date   Anxiety    Breast cancer (HCC)    Diabetes mellitus without complication (HCC)    Hyperlipidemia    Hypertension    Type 2 diabetes mellitus without retinopathy (HCC) 06/17/2017   Past Surgical History:  Past Surgical History:  Procedure Laterality Date   ABDOMINAL HYSTERECTOMY     BREAST BIOPSY     CARDIOVERSION N/A 08/22/2023   Procedure: CARDIOVERSION;  Surgeon: Lauralee Poll, MD;  Location: [facility] IN[facility]ASIVE CV [facility];  Service: Cardiovascular;  Laterality: N/A;   CLIPPING OF ATRIAL APPENDAGE  08/13/2023   Procedure: CLIPPING, LEFT ATRIAL APPENDAGE USING ATRICURE LAA EXCLUSION SYSTEM SIZE 40;  Surgeon: [name], MD;  Location: [facility] OR;  Service: Open Heart Surgery;;   IABP INSERTION N/A 08/08/2023   Procedure: IABP Insertion;  Surgeon: Swaziland, Peter M, MD;  Location: [facility] CV [facility];  Service: Cardiovascular;  Laterality: N/A;   INTRAOPERATIVE TRANSESOPHAGEAL ECHOCARDIOGRAM N/A 08/13/2023   Procedure: ECHOCARDIOGRAM, TRANSESOPHAGEAL, INTRAOPERATIVE;  Surgeon: [name], MD;  Location: [facility] OR;  Service: Open Heart Surgery;  Laterality: N/A;   MASTECTOMY Left 2016   MITRAL VALVE REPLACEMENT N/A 08/13/2023   Procedure: MITRAL VALVE REPLACEMENT USING ON-X PROSTHETIC MITRAL HEART VALVE SIZE 31/33MM;  Surgeon: [name], MD;  Location: [facility] OR;  Service: Open Heart Surgery;  Laterality: N/A;   RIGHT/LEFT HEART CATH AND CORONARY ANGIOGRAPHY N/A 08/08/2023   Procedure: RIGHT/LEFT HEART CATH AND CORONARY  ANGIOGRAPHY;  Surgeon: Swaziland, Peter M, MD;  Location: [facility] INVASIVE CV LAB;  Service: Cardiovascular;  Laterality: N/A;   TOOTH EXTRACTION N/A 08/07/2023   Procedure: EXTRACTION TEETH NUMBERS ELEVEN, THIRTEEN, AND FOURTEEN;  Surgeon: Ascencion Lava, DMD;  Location: [facility] OR;  Service: Oral Surgery;  Laterality: N/A;   TRANSESOPHAGEAL ECHOCARDIOGRAM (CATH LAB) N/A 08/04/2023   Procedure: TRANSESOPHAGEAL ECHOCARDIOGRAM;  Surgeon: Hazle Lites, MD;  Location: [facility];  Service: Cardiovascular;  Laterality: N/A;   TRANSESOPHAGEAL ECHOCARDIOGRAM (CATH LAB) N/A 08/22/2023   Procedure: TRANSESOPHAGEAL ECHOCARDIOGRAM;  Surgeon: Lauralee Poll, MD;  Location: [facility] INVASIVE CV LAB;  Service: Cardiovascular;  Laterality: N/A;    Assessment & Plan Clinical Impression: Patient is a  Melissa Bray is a 63 y.o. female with medical history significant for type 2 diabetes mellitus, essential hypertension, hyperlipidemia, breast cancer status post left mastectomy in 2016, who was admitted to [facility] on 07/31/2023 by way of transfer from [facility] with suspected severe sepsis due to left upper extremity cellulitis after presenting from home to the latter facility complaining of generalized weakness. Imaging in the ED, per corresponding formal radiology read, was notable for the following: 2 view chest x-ray showed no evidence of acute cardiopulmonary process, including no evidence of infiltrate, edema, effusion, or pneumothorax. Vital signs in the ED were notable for the following: Temperature max 1-3.2; heart rates initially in the 1 teens, essentially decreasing into the 90s following IV fluids; initial blood pressure 95/65, with ensuing increase in systolic blood pressures into the 1 teens 120s following interval IV fluids; respiratory rate 19-24, oxygen  saturation 94  to 98% on room air. Labs were notable for the following: CMP was notable for the following: Sodium 131, which corrected to  approximately 134 when taking into account concomitant hyperglycemia, potassium 3.6, bicarbonate 20, anion gap 18, creatinine 1.11 compared to 0.8 in November 2024, glucose 288, liver enzymes were within normal limits.  Hemoglobin A1c 12.8%.  Initial lactic acid 1.4, with repeat value trending down to 1.3.  CBC notable for white blood cell count 9700 with 81% neutrophils.  INR 1.1.  Urinalysis showed 11-20 white blood cells, nitrate negative, small leukocyte esterase, along with 6-10 squamous cells, 100 protein and specific gravity of 1.025.  Blood cultures x 2 were collected prior to initiation of antibiotics.  COVID, influenza, RSV PCR were all negative. EKG in ED demonstrated the following: Sinus tachycardia with heart rate 106, normal intervals, no evidence of T wave changes, nonspecific less than 1 mm ST elevation in leads III, aVF, no evidence of greater than 1 mm ST elevation. She was transferred  07/31/23 to [facility]. During her evaluation she was diagnosed with GBS bacteremia and mitral valve endocarditis with leaflet perforation. She required dental extractions for dental carries. On 08/08/23, while in cath lab for LHC, Pt. deteriorated due to flash pulmonary edema, intubated, IABP. She was intubated 08/08/23-08/11/23. Pt. Underwent Mitral valve replacement 08/12/23. Both cardiothoracic surgery and heart failure team continue to follow. She was placed on penicillin  G per infectious disease recommendations of 6 weeks initiated 08/13/2023 through 09/24/2023. Pt. Seen by PT/OT and they recommended CIR to assist return to PLOF.  Patient transferred to CIR on 08/29/2023 .    Patient currently requiresCGA to min with basic self-care skills secondary to muscle weakness and decreased cardiorespiratoy endurance.  Prior to hospitalization, patient could complete BADL with independent .  Patient will benefit from skilled intervention to decrease level of assist with basic self-care skills prior to  discharge home with care partner.  Anticipate patient will require 24 hour supervision and no further OT follow recommended.  OT - End of Session Activity Tolerance: Tolerates 30+ min activity with multiple rests Endurance Deficit: Yes Endurance Deficit Description: fatigues after 10-15 minutes of activity OT Assessment Rehab Potential (ACUTE ONLY): Good OT Patient demonstrates impairments in the following area(s): Endurance OT Basic ADL's Functional Problem(s): Dressing;Bathing OT Advanced ADL's Functional Problem(s): Simple Meal Preparation;Laundry OT Transfers Functional Problem(s): Tub/Shower (patient has shower/tub combined) OT Plan OT Intensity: Minimum of 1-2 x/day, 45 to 90 minutes OT Frequency: Total of 15 hours over 7 days of combined therapies OT Duration/Estimated Length of Stay: 7-10days OT Treatment/Interventions: Therapeutic Exercise;UE/LE Coordination activities;Therapeutic Activities OT Self Feeding Anticipated Outcome(s): ModI OT Basic Self-Care Anticipated Outcome(s): ModI OT Toileting Anticipated Outcome(s): ModI OT Bathroom Transfers Anticipated Outcome(s): ModI OT Recommendation Patient destination: Home Follow Up Recommendations: None Equipment Recommended: Tub/shower bench;3 in 1 bedside comode Equipment Details: Patient could possibly benefit from shower chair with back and extension secondary to shower/tub combined in the home.   OT Evaluation Precautions/Restrictions  Precautions Precautions: Sternal;Fall Precaution Booklet Issued: Yes (comment) Recall of Precautions/Restrictions: Intact Precaution/Restrictions Comments: good recall of precautions Restrictions Weight Bearing Restrictions Per Provider Order: Yes RUE Weight Bearing Per Provider Order: Non weight bearing (secondary to sternal precaution.) LUE Weight Bearing Per Provider Order: Non weight bearing RLE Weight Bearing Per Provider Order: Weight bearing as tolerated LLE Weight Bearing Per  Provider Order: Weight bearing as tolerated Other Position/Activity Restrictions: sternal General Chart Reviewed: Yes Vital Signs Therapy Vitals Pulse Rate: 94 BP:  135/74 Patient Position (if appropriate): Sitting Oxygen  Therapy SpO2: 100 % O2 Device: Room Air Patient Activity (if Appropriate): In chair Pain Pain Assessment Pain Scale: 0-10 Home Living/Prior Functioning Home Living Living Arrangements: Children Available Help at Discharge: Family, Available 24 hours/day Type of Home: House Home Access: Stairs to enter [employer] entrance leading to dining room) Secretary/administrator of Steps: 2 Entrance Stairs-Rails: None Home Layout: One level Bathroom Shower/Tub: Tub/shower unit, Engineer, building services: Standard Additional Comments: 24/7 between son and caretaker  Lives With: Son (Patient has resided with son since dx of CA) IADL History Homemaking Responsibilities: Yes Meal Prep Responsibility: Primary (share responsibility with son. Son works as Investment banker, operational) Haematologist: Primary (share responsibility with son) Cleaning Responsibility: Primary (share responsibility with son) Current License: Yes Mode of Transportation: Car Type of Occupation: CNA Prior Function Level of Independence: Independent with transfers, Independent with gait  Able to Take Stairs?: Yes Driving: Yes Vocation: Full time employment Vocation Requirements: PLanning to retire after hospitalization Vision Baseline Vision/History: 1 Wears glasses (broke corrective lenses, wears readers.) Ability to See in Adequate Light: 0 Adequate Patient Visual Report: No change from baseline Vision Assessment?: Wears glasses for reading;No apparent visual deficits Perception  Perception: Within Functional Limits Praxis Praxis: WFL Cognition Cognition Overall Cognitive Status: Within Functional Limits for tasks assessed Arousal/Alertness: Awake/alert Memory: Appears intact Awareness: Appears  intact Problem Solving: Appears intact Safety/Judgment: Appears intact Brief Interview for Mental Status (BIMS) Repetition of Three Words (First Attempt): 3 Temporal Orientation: Year: Correct Temporal Orientation: Month: Accurate within 5 days Temporal Orientation: Day: Correct Recall: "Sock": Yes, no cue required Recall: "Blue": Yes, no cue required Recall: "Bed": Yes, no cue required BIMS Summary Score: 15 Sensation Sensation Light Touch: Appears Intact Coordination Gross Motor Movements are Fluid and Coordinated: No Coordination and Movement Description: gross weakness and sternal precautions (per chart review and observation) Motor  Motor Motor: Within Functional Limits  Trunk/Postural Assessment  Thoracic Assessment Thoracic Assessment: Exceptions to [facility] Lumbar Assessment Lumbar Assessment: Within Functional Limits Postural Control Postural Control: Within Functional Limits  Balance Static Sitting Balance Static Sitting - Balance Support: Feet supported Dynamic Sitting Balance Dynamic Sitting - Balance Support: Feet supported Dynamic Sitting - Level of Assistance: 5: Stand by assistance Static Standing Balance Static Standing - Balance Support: Bilateral upper extremity supported Static Standing - Level of Assistance: 5: Stand by assistance Dynamic Standing Balance Dynamic Standing - Balance Support: During functional activity Dynamic Standing - Level of Assistance: 5: Stand by assistance Extremity/Trunk Assessment RUE Assessment RUE Assessment: Within Functional Limits Passive Range of Motion (PROM) Comments: WFL (Adherence to sternal precaution) Active Range of Motion (AROM) Comments: WFL (Full Adherence to sternal precaution) General Strength Comments: 3/5MMT LUE Assessment LUE Assessment: Within Functional Limits Passive Range of Motion (PROM) Comments: WFL (Full adherence to sternal precaution.) Active Range of Motion (AROM) Comments: WFL (full adherence to  sternal precaution.) General Strength Comments: 3/5  Care Tool Care Tool Self Care Eating   Eating Assist Level: Set up assist    Oral Care    Oral Care Assist Level: Set up assist    Bathing   Body parts bathed by patient: Right arm;Left arm;Chest;Abdomen;Front perineal area;Buttocks;Face;Left upper leg;Right upper leg;Left lower leg;Right lower leg   Body parts n/a: Right lower leg;Left lower leg Assist Level: Minimal Assistance - Patient > 75%    Upper Body Dressing(including orthotics)   What is the patient wearing?: Hospital gown only   Assist Level: Minimal Assistance - Patient > 75% (s/u  to MinA with fasteners)    Lower Body Dressing (excluding footwear)   What is the patient wearing?: Hospital gown only Assist for lower body dressing: Minimal Assistance - Patient > 75% (Patient was able to simulate LB dressing using theraband with MinA)    Putting on/Taking off footwear   What is the patient wearing?: Socks;Shoes Assist for footwear: Set up assist (s/u A to MinA)       Care Tool Toileting Toileting activity   Assist for toileting: Set up assist     Care Tool Bed Mobility Roll left and right activity   Roll left and right assist level: Supervision/Verbal cueing    Sit to lying activity   Sit to lying assist level: Supervision/Verbal cueing    Lying to sitting on side of bed activity   Lying to sitting on side of bed assist level: the ability to move from lying on the back to sitting on the side of the bed with no back support.: Supervision/Verbal cueing     Care Tool Transfers Sit to stand transfer   Sit to stand assist level: Contact Guard/Touching assist    Chair/bed transfer   Chair/bed transfer assist level: Contact Guard/Touching assist     Toilet transfer   Assist Level: Contact Guard/Touching assist     Care Tool Cognition  Expression of Ideas and Wants Expression of Ideas and Wants: 4. Without difficulty (complex and basic) - expresses  complex messages without difficulty and with speech that is clear and easy to understand  Understanding Verbal and Non-Verbal Content Understanding Verbal and Non-Verbal Content: 4. Understands (complex and basic) - clear comprehension without cues or repetitions   Memory/Recall Ability     Refer to Care Plan for Long Term Goals  SHORT TERM GOAL WEEK 1 OT Short Term Goal 1 (Week 1): The pt will tolerate > 30 minutes of OT activity with 1 to 2 rest breaks. OT Short Term Goal 2 (Week 1): The pt will complete a tub transfer with  ModI using a shower chair with extension with good safety awareness. OT Short Term Goal 3 (Week 1): The pt will complete LB bathing/dressing with ModI and full adherence to sternal precautions. OT Short Term Goal 4 (Week 1): The pt will complete simple home making task at ModI  with full adherence to sternal precaution and good safety measures.  Recommendations for other services: None    Skilled Therapeutic Intervention  Patient seen this day for an Occupational Therapy evaluation to address residual deficits for gains in functionals outcome. The pt presented in good spirit at the time of the evaluation. The pt indicated that she was met with challenges in relation to sleeping on last night.The pt had no pain to report.  The pt resides with her son who she shares most household duties with. The pt indicated that her son is very supported and this has been her living situation since her initial diagnosis of CA. The pt reports being Ind prior to  her most recent episode. The pt presents as very high functioning, she is CGA to MinA with BADL related task in UB/LB bathing and dressing.  The pt is able to transfer from one surface to the next, using the RW with CGA and  additional time. The pt presents with some challenges in relation to activity tolerance and in my opinion she would benefit from 7-10 days of skilled Occupational Therapy to address residual deficits in UB/LB task  performance,  functional t/f's to all  surfaces, and to improve her overall activity tolerance with the objective of reducing the burden on care for the care provider and improving her functional outcome to ModI for a safe return home. The pt is currently able to verbalize and demonstrate sternal precautions for effective carryover. I recommend 7-10 days of Occupational therapy with modification in the POC as needed.  No additional services noted at the time of d/c.  ADL ADL Equipment Provided: Other (comment) (RW) Eating: Set up Where Assessed-Eating: Chair;Bed level;Edge of bed (based on observation at the time of eval) Grooming: Setup Where Assessed-Grooming: Other (comment) (based on observaiton during functional task performance.) Upper Body Bathing: Setup (patient was able to complete a simulated task in bathing EOB) Where Assessed-Upper Body Bathing: Edge of bed Lower Body Bathing: Setup Where Assessed-Lower Body Bathing: Edge of bed Upper Body Dressing: Setup Where Assessed-Upper Body Dressing: Edge of bed Lower Body Dressing: Setup Where Assessed-Lower Body Dressing: Edge of bed Toileting: Setup;Modified independent (using RW and grab bars.) Toilet Transfer: Licensed conveyancer: Other (comment) (arms across chest for sternal precautions able ot come from sit to stand using momentum) Tub/Shower Transfer: Scientific laboratory technician Method: Ambulating (using RW) Tub/Shower Equipment: Information systems manager with back Film/video editor: Not assessed (would benefit from shower chair with extension.) Film/video editor Method: Designer, industrial/product: Information systems manager with back (CGA for performance based on functional staus) Mobility  Bed Mobility Bed Mobility: Rolling Right Rolling Right: Contact Guard/Touching assist Transfers Sit to Stand: Contact Guard/Touching assist   Discharge Criteria: Patient will be discharged from OT if patient refuses  treatment 3 consecutive times without medical reason, if treatment goals not met, if there is a change in medical status, if patient makes no progress towards goals or if patient is discharged from hospital.  The above assessment, treatment plan, treatment alternatives and goals were discussed and mutually agreed upon: by patient  Moises Ang 08/30/2023, 3:45 PM

## 2023-08-30 NOTE — Progress Notes (Signed)
 Hypoglycemic Event  CBG: 65  Treatment: 4 oz juice/soda  Symptoms: None  Follow-up CBG: Time:1748 CBG Result:106  Possible Reasons for Event: Unknown   Melissa Bray

## 2023-08-30 NOTE — Progress Notes (Signed)
 PHARMACY - ANTICOAGULATION CONSULT NOTE  Pharmacy Consult for warfarin Indication: onX mMVR  No Known Allergies  Patient Measurements: Height: 5\' 5"  (165.1 cm) Weight: 114.3 kg (251 lb 15.8 oz) IBW/kg (Calculated) : 57 HEPARIN  DW (KG): 84.4  Vital Signs: Temp: 98.3 F (36.8 C) (05/31 1325) Temp Source: Oral (05/31 1325) BP: 107/77 (05/31 1325) Pulse Rate: 91 (05/31 1325)  Labs: Recent Labs    08/28/23 0430 08/28/23 1005 08/28/23 1253 08/29/23 0400 08/30/23 0820  HGB 9.0*  --   --  9.3*  --   HCT 28.6*  --   --  29.3*  --   PLT 429*  --   --  404*  --   LABPROT 38.5*  --   --  39.2* 34.0*  INR 3.9*  --   --  4.0* 3.3*  CREATININE  --  0.96 1.11* 0.93  --     Estimated Creatinine Clearance: 77.1 mL/min (by C-G formula based on SCr of 0.93 mg/dL).   Medical History: Past Medical History:  Diagnosis Date   Anxiety    Breast cancer (HCC)    Diabetes mellitus without complication (HCC)    Hyperlipidemia    Hypertension    Type 2 diabetes mellitus without retinopathy (HCC) 06/17/2017    Medications:  Scheduled:   amiodarone   200 mg Oral Daily   anastrozole   1 mg Oral Daily   carvedilol   3.125 mg Oral BID WC   [START ON 08/31/2023] Chlorhexidine  Gluconate Cloth  6 each Topical Q12H   dapagliflozin  propanediol  10 mg Oral Daily   feeding supplement  237 mL Oral BID BM   insulin  aspart  0-15 Units Subcutaneous TID WC   insulin  aspart  3 Units Subcutaneous TID WC   insulin  glargine-yfgn  22 Units Subcutaneous BID   pantoprazole   40 mg Oral Daily   spironolactone   12.5 mg Oral Daily   torsemide   20 mg Oral Daily   Warfarin - Pharmacist Dosing Inpatient   Does not apply q1600    Assessment: 63 yo F presenting with acute mitral valve endocarditis with perforated mitral leaflet and severe MR. Underwent dental extraction 5/8. Presented for pre-op RHC and developed flash pulmonary edema requiring intubation - IABP was placed in lab and IV heparin  started. No AC  PTA.  Pt now s/p cardiac surgery with onX mMVR placed. Pharmacy consulted to start warfarin 5/16 after IABP removal 5/15. S/p DCCV 5/23 and maintaining NSR. Heparin  bridge dc'd once INR > 2.  Patient has transferred to inpatient rehab.  Pharmacy continuing with warfarin dosing.  Warfarin doses have been variable over the last week ranging from 2-10mg  and an elevated INR.  INR within goal.  Will attempt to establish stable regimen for discharge.  Goal of Therapy:  INR 2.5-3.5 Monitor platelets by anticoagulation protocol: Yes   Plan:  Warfarin 5mg  PO x 1 Daily INR and CBC   Chetan Mehring, Pharm.D., BCPS Clinical Pharmacist  08/30/2023 2:41 PM

## 2023-08-30 NOTE — Progress Notes (Signed)
 PROGRESS NOTE   Subjective/Complaints:  No events overnight.  No acute complaints.  Patient slept well, eating well. Vitals stable Last BM 5/31, large  Blood sugars very tightly controlled, low this afternoon.  ROS: Denies fevers, chills, N/V, abdominal pain, constipation, diarrhea, SOB, cough, chest pain, new weakness or paraesthesias.    Objective:   No results found. Recent Labs    08/28/23 0430 08/29/23 0400  WBC 6.7 6.4  HGB 9.0* 9.3*  HCT 28.6* 29.3*  PLT 429* 404*   Recent Labs    08/28/23 1253 08/29/23 0400  NA 138 138  K 4.1 3.9  CL 104 105  CO2 22 26  GLUCOSE 221* 90  BUN 12 11  CREATININE 1.11* 0.93  CALCIUM  9.5 8.9    Intake/Output Summary (Last 24 hours) at 08/30/2023 0842 Last data filed at 08/30/2023 0741 Gross per 24 hour  Intake 667.43 ml  Output --  Net 667.43 ml     Pressure Injury 08/27/23 Buttocks Other (Comment);Right Stage 2 -  Partial thickness loss of dermis presenting as a shallow open injury with a red, pink wound bed without slough. Small pink are stage 2 right outer buttocks (Active)  08/27/23 0010  Location: Buttocks  Location Orientation: Other (Comment);Right (outer)  Staging: Stage 2 -  Partial thickness loss of dermis presenting as a shallow open injury with a red, pink wound bed without slough.  Wound Description (Comments): Small pink are stage 2 right outer buttocks  Present on Admission:     Physical Exam: Vital Signs Blood pressure 125/63, pulse 91, temperature 99 F (37.2 C), temperature source Oral, resp. rate 18, height 5\' 5"  (1.651 m), weight 114.3 kg, SpO2 98%.  Physical Exam Constitutional:      General: She is not in acute distress.  Laying in bed     Appearance: She is obese. She is not ill-appearing.  HENT:     Head: Normocephalic and atraumatic.     Nose: Nose normal.     Mouth/Throat:     Mouth: Mucous membranes are moist.  Eyes:      Extraocular Movements: Extraocular movements intact.     Conjunctiva/sclera: Conjunctivae normal.     Pupils: Pupils are equal, round, and reactive to light.  Cardiovascular:     Rate and Rhythm: Normal rate and regular rhythm.     Heart sounds: Murmur heard.  Pulmonary:     Effort: Pulmonary effort is normal. No respiratory distress.     Breath sounds: No wheezing, rhonchi or rales.  Abdominal:     General: Bowel sounds are normal. There is no distension.     Palpations: Abdomen is soft.     Tenderness: There is no abdominal tenderness.  Musculoskeletal:     Cervical back: Normal range of motion.     Right lower leg: Edema present.     Left lower leg: Edema present.  Skin:    General: Skin is warm and dry.     Comments: Old left mastectomy scar --well-healed. Neurological:     Comments: Alert and oriented x 3. Normal insight and awareness. Intact Memory. Normal language and speech. Cranial nerve exam unremarkable. MMT: BUE motor 4+  to 5/5 prox to distal. BLE: 4 HF, KE and 4+ ADF/PF. Did not appreciate any focal sensory loss or abnl tone. DTR's 1+. No ataxia.    Psychiatric:        Mood and Affect: Mood normal.        Behavior: Behavior normal.   Physical exam unchanged from the above on reexamination 08/30/23    Assessment/Plan: 1. Functional deficits which require 3+ hours per day of interdisciplinary therapy in a comprehensive inpatient rehab setting. Physiatrist is providing close team supervision and 24 hour management of active medical problems listed below. Physiatrist and rehab team continue to assess barriers to discharge/monitor patient progress toward functional and medical goals  Care Tool:  Bathing              Bathing assist       Upper Body Dressing/Undressing Upper body dressing        Upper body assist      Lower Body Dressing/Undressing Lower body dressing            Lower body assist       Toileting Toileting    Toileting assist        Transfers Chair/bed transfer  Transfers assist           Locomotion Ambulation   Ambulation assist              Walk 10 feet activity   Assist           Walk 50 feet activity   Assist           Walk 150 feet activity   Assist           Walk 10 feet on uneven surface  activity   Assist           Wheelchair     Assist               Wheelchair 50 feet with 2 turns activity    Assist            Wheelchair 150 feet activity     Assist          Blood pressure 125/63, pulse 91, temperature 99 F (37.2 C), temperature source Oral, resp. rate 18, height 5\' 5"  (1.651 m), weight 114.3 kg, SpO2 98%.  Medical Problem List and Plan: 1. Functional deficits secondary to debility after bacterial endocarditis/status post mitral valve replacement 08/12/2023 with multiple associated complications.   -Sternal precautions             -patient may shower             -ELOS/Goals: 7-10 days, mod I to supervision goals.  2.  Antithrombotics: -DVT/anticoagulation:  Pharmaceutical: Coumadin .  INR range 2.5-3.5 -monitor labs daily, pharmacy protocol             -antiplatelet therapy: N/A 3. Pain Management: Oxycodone  as needed, tramadol  4. Mood/Behavior/Sleep: Klonopin  0.5 mg twice daily as needed anxiety, melatonin 5 mg nightly as needed             -antipsychotic agents: N/A 5. Neuropsych/cognition: This patient is capable of making decisions on her own behalf. 6. Skin/Wound Care: Routine skin checks 7. Fluids/Electrolytes/Nutrition: Routine in and outs with follow-up chemistries 8.  Acute blood loss anemia.  Follow-up CBC 9.  ID/bacterial endocarditis.  Penicillin  G initiated 08/13/2023.  Continue through 09/24/2023.   - weekly labs for monitoring - Follow-up with infectious disease as outpt --[facility] 09/29/23 @0930  -Admission  labs stable.  10.  Hypertension.  Coreg  3.125 mg twice daily, Entresto  24-26 mg twice daily,  Aldactone  12.5 mg daily, Demadex  20 mg daily, Farxiga  10 mg daily  - Normotensive, monitor on current regimen    08/30/2023    6:00 AM 08/30/2023    3:45 AM 08/29/2023    7:53 PM  Vitals with BMI  Weight 252 lbs    BMI 41.93    Systolic  125   125 129  Diastolic  63   63 69  Pulse  91   91 83    11.  Atrial flutter with RVR.  Status post DCCV 5/23.  Maintaining normal sinus rhythm.  Amiodarone  200 mg daily             -rate well controlled at present 12.  Diabetes mellitus.  Hemoglobin A1c 12.8.  Currently on NovoLog  3 units 3 times daily with meals, Semglee  25 units twice daily.  Prior to admission patient on Glucotrol  XL 10 mg daily, Lantus  insulin  20 units daily.   -reasonable control at present -Resume as needed -5-30: Tight control last night and this a.m., reduce Semglee  to 22 units twice daily Recent Labs    08/29/23 1641 08/29/23 2039 08/30/23 0610  GLUCAP 124* 85 87     13.  History of breast cancer status post left mastectomy 2016.  Continue Arimidex  and follow-up outpatient 14.  Class III morbid obesity.  BMI 42.67.  Dietary follow-up    LOS: 1 days A FACE TO FACE EVALUATION WAS PERFORMED  Bea Lime 08/30/2023, 8:42 AM

## 2023-08-30 NOTE — Progress Notes (Signed)
 Occupational Therapy Session Note  Patient Details  Name: Melissa Bray MRN: [id number] Date of Birth: 1961-03-13  {CHL IP REHAB OT TIME CALCULATIONS:[id number]}   Short Term Goals: Week 1:  OT Short Term Goal 1 (Week 1): The pt will tolerate > 30 minutes of OT activity with 1 to 2 rest breaks. OT Short Term Goal 2 (Week 1): The pt will complete a tub transfer with  ModI using a shower chair with extension with good safety awareness. OT Short Term Goal 3 (Week 1): The pt will complete LB bathing/dressing with ModI and full adherence to sternal precautions. OT Short Term Goal 4 (Week 1): The pt will complete simple home making task at ModI  with full adherence to sternal precaution and good safety measures.  Skilled Therapeutic Interventions/Progress Updates:    Patient agreeable to participate in OT session. Reports *** pain level.   Patient participated in skilled OT session focusing on ***. Therapist facilitated/assessed/developed/educated/integrated/elicited *** in order to improve/facilitate/promote    Therapy Documentation Precautions:  Precautions Precautions: Sternal, Fall Precaution Booklet Issued: Yes (comment) Recall of Precautions/Restrictions: Intact Precaution/Restrictions Comments: good recall of precautions Restrictions Weight Bearing Restrictions Per Provider Order: Yes RUE Weight Bearing Per Provider Order: Non weight bearing (secondary to sternal precaution.) LUE Weight Bearing Per Provider Order: Non weight bearing  Other Position/Activity Restrictions: sternal   Therapy/Group: Individual Therapy  Carollee Circle, OTR/L,CBIS  Supplemental OT - [facility] Secure Chat Preferred   08/30/2023, 8:36 PM

## 2023-08-30 NOTE — Evaluation (Signed)
 Physical Therapy Assessment and Plan  Patient Details  Name: Melissa Bray MRN: [id number] Date of Birth: 22-Feb-1961  PT Diagnosis: Difficulty walking and Muscle weakness Rehab Potential: Excellent ELOS: 7-10 days   Today's Date: 08/30/2023 PT Individual Time: 0810-0920 PT Individual Time Calculation (min): 70 min    Hospital Problem: Principal Problem:   Debility   Past Medical History:  Past Medical History:  Diagnosis Date   Anxiety    Breast cancer (HCC)    Diabetes mellitus without complication (HCC)    Hyperlipidemia    Hypertension    Type 2 diabetes mellitus without retinopathy (HCC) 06/17/2017   Past Surgical History:  Past Surgical History:  Procedure Laterality Date   ABDOMINAL HYSTERECTOMY     BREAST BIOPSY     CARDIOVERSION N/A 08/22/2023   Procedure: CARDIOVERSION;  Surgeon: Lauralee Poll, MD;  Location: [facility] IN[facility]IVE [facility];  Service: Cardiovascular;  Laterality: N/A;   CLIPPING OF ATRIAL APPENDAGE  08/13/2023   Procedure: CLIPPING, LEFT ATRIAL APPENDAGE USING ATRICURE LAA EXCLUSION SYSTEM SIZE 40;  Surgeon: [name], MD;  Location[facility];  Service: Open Heart Surgery;;   IABP INSERTION N/A 08/08/2023   Procedure: IABP Insertion;  Surgeon: Swaziland, Peter M, MD;  Location: [facility];  Service: Cardiovascular;  Laterality: N/A;   INTRAOPERATIVE TRANSESOPHAGEAL ECHOCARDIOGRAM N/A 08/13/2023   Procedure: ECHOCARDIOGRAM, TRANSESOPHAGEAL, INTRAOPERATIVE;  Surgeon: [name], MD;  Location: [facility] OR;  Service: Open Heart Surgery;  Laterality: N/A;   MASTECTOMY Left 2016   MITRAL VALVE REPLACEMENT N/A 08/13/2023   Procedure: MITRAL VALVE REPLACEMENT USING ON-X PROSTHETIC MITRAL HEART VALVE SIZE 31/33MM;  Surgeon: [name], MD;  Location: [facility] OR;  Service: Open Heart Surgery;  Laterality: N/A;   RIGHT/LEFT HEART CATH AND CORONARY ANGIOGRAPHY N/A 08/08/2023   Procedure: RIGHT/LEFT HEART CATH AND CORONARY ANGIOGRAPHY;  Surgeon:  Swaziland, Peter M, MD;  Location: [facility] INVASIVE CV LAB;  Service: Cardiovascular;  Laterality: N/A;   TOOTH EXTRACTION N/A 08/07/2023   Procedure: EXTRACTION TEETH NUMBERS ELEVEN, THIRTEEN, AND FOURTEEN;  Surgeon: Ascencion Lava, DMD;  Location: [facility] OR;  Service: Oral Surgery;  Laterality: N/A;   TRANSESOPHAGEAL ECHOCARDIOGRAM (CATH LAB) N/A 08/04/2023   Procedure: TRANSESOPHAGEAL ECHOCARDIOGRAM;  Surgeon: Hazle Lites, MD;  Location: [facility];  Service: Cardiovascular;  Laterality: N/A;   TRANSESOPHAGEAL ECHOCARDIOGRAM (CATH LAB) N/A 08/22/2023   Procedure: TRANSESOPHAGEAL ECHOCARDIOGRAM;  Surgeon: Lauralee Poll, MD;  Location: [facility] INVASIVE CV LAB;  Service: Cardiovascular;  Laterality: N/A;    Assessment & Plan Clinical Impression: Melissa Bray is a 63 year old right-handed female with history significant for type 2 diabetes mellitus, essential hypertension, hyperlipidemia, breast cancer status post left mastectomy 2016, class III morbid obesity BMI 42.67. Per chart review patient lives with son. 1 level home 2 steps to enter. 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 Presented 07/31/2023 to [facility] for suspected sepsis due to left upper extremity cellulitis. She was found to have significant edema to left upper extremity was treated for sepsis/cellulitis when a murmur was noted. She was transferred to [facility] and a TEE was performed on 5/5 which demonstrated severe mitral regurgitation due to endocarditis and ejection fraction of 55 to 60%. Patient also had extraction of teeth 5/8. The following day she deteriorated went to cardiogenic shock. She was intubated and intra-aortic balloon pump was placed and ultimately underwent mitral valve replacement 08/12/2023 per Dr. Luna Salinas and placed on Coumadin  therapy. She underwent TEE and  cardioversion on 08/22/2023. Sternal precautions as indicated. Both cardiothoracic surgery and heart failure team continue to follow. She  was placed on penicillin  G per infectious disease recommendations of 6 weeks initiated 08/13/2023 through 09/24/2023. Evaluations completed due to patient's debility and multimedical issues was admitted for a comprehensive rehab program.   Patient transferred to CIR on 08/29/2023 .   Patient currently requires min with mobility secondary to muscle weakness, decreased cardiorespiratoy endurance and decreased oxygen  support, and difficulty maintaining precautions.  Prior to hospitalization, patient was independent  with mobility and lived with Son in a House home.  Home access is 2Stairs to enter.  Patient will benefit from skilled PT intervention to maximize safe functional mobility, minimize fall risk, and decrease caregiver burden for planned discharge home with 24 hour supervision.  Anticipate patient will benefit from follow up OP at discharge.  PT - End of Session Activity Tolerance: Tolerates 30+ min activity with multiple rests Endurance Deficit: Yes Endurance Deficit Description: Fatigues quickly and O2 sats down to 92% PT Assessment Rehab Potential (ACUTE/IP ONLY): Excellent PT Barriers to Discharge: Home environment access/layout;Wound Care PT Patient demonstrates impairments in the following area(s): Balance;Safety;Endurance PT Transfers Functional Problem(s): Bed Mobility;Bed to Chair;Car PT Locomotion Functional Problem(s): Ambulation;Stairs PT Plan PT Intensity: Minimum of 1-2 x/day ,45 to 90 minutes PT Frequency: 5 out of 7 days PT Duration Estimated Length of Stay: 7-10 days PT Treatment/Interventions: Ambulation/gait training;Community reintegration;DME/adaptive equipment instruction;Neuromuscular re-education;Psychosocial support;Stair training;UE/LE Strength taining/ROM;Wheelchair propulsion/positioning;UE/LE Coordination activities;Therapeutic Activities;Skin care/wound management;Pain management;Functional electrical stimulation;Discharge planning;Balance/vestibular  training;Cognitive remediation/compensation;Disease management/prevention;Functional mobility training;Patient/family education;Splinting/orthotics;Therapeutic Exercise;Visual/perceptual remediation/compensation PT Transfers Anticipated Outcome(s): mod I with LRAD PT Locomotion Anticipated Outcome(s): mod I with LRAD PT Recommendation Follow Up Recommendations: Outpatient PT Patient destination: Home Equipment Recommended: To be determined   PT Evaluation Precautions/Restrictions Precautions Precautions: Sternal;Fall Precaution Booklet Issued: Yes (comment) Recall of Precautions/Restrictions: Intact Precaution/Restrictions Comments: good recall of precautions Restrictions Weight Bearing Restrictions Per Provider Order: Yes RUE Weight Bearing Per Provider Order: Non weight bearing (secondary to sternal precaution.) LUE Weight Bearing Per Provider Order: Non weight bearing RLE Weight Bearing Per Provider Order: Weight bearing as tolerated LLE Weight Bearing Per Provider Order: Weight bearing as tolerated Other Position/Activity Restrictions: sternal General   Vital SignsTherapy Vitals Pulse Rate: 94 BP: 135/74 Patient Position (if appropriate): Sitting Oxygen  Therapy SpO2: 100 % O2 Device: Room Air Patient Activity (if Appropriate): In chair Pain Pain Assessment Pain Scale: 0-10 Pain Score: 0-No pain Pain Interference   Home Living/Prior Functioning   Vision/Perception     Cognition Orientation Level: Oriented X4 Sensation Sensation Light Touch: Appears Intact Proprioception: Appears Intact Stereognosis: Appears Intact Coordination Gross Motor Movements are Fluid and Coordinated: No Coordination and Movement Description: gross weakness and sternal precautions Motor  Motor Motor: Within Functional Limits   Trunk/Postural Assessment  Cervical Assessment Cervical Assessment: Within Functional Limits Thoracic Assessment Thoracic Assessment: Exceptions to [facility]  (sternal precautions) Lumbar Assessment Lumbar Assessment: Within Functional Limits Postural Control Postural Control: Within Functional Limits  Balance Balance Balance Assessed: Yes Static Sitting Balance Static Sitting - Balance Support: Feet supported Static Sitting - Level of Assistance: 6: Modified independent (Device/Increase time) Dynamic Sitting Balance Dynamic Sitting - Balance Support: Feet supported Dynamic Sitting - Level of Assistance: 5: Stand by assistance Static Standing Balance Static Standing - Balance Support: Bilateral upper extremity supported Static Standing - Level of Assistance: 5: Stand by assistance Dynamic Standing Balance Dynamic Standing - Balance Support: During functional activity Dynamic Standing - Level of Assistance: 5: Stand  by assistance Extremity Assessment      RLE Assessment RLE Assessment: Exceptions to [facility] General Strength Comments: grossly 4/5 LLE Assessment LLE Assessment: Exceptions to [facility] General Strength Comments: grossly 4/5  Care Tool Care Tool Bed Mobility Roll left and right activity   Roll left and right assist level: Supervision/Verbal cueing    Sit to lying activity   Sit to lying assist level: Supervision/Verbal cueing    Lying to sitting on side of bed activity   Lying to sitting on side of bed assist level: the ability to move from lying on the back to sitting on the side of the bed with no back support.: Supervision/Verbal cueing     Care Tool Transfers Sit to stand transfer   Sit to stand assist level: Contact Guard/Touching assist    Chair/bed transfer   Chair/bed transfer assist level: Contact Guard/Touching assist    Car transfer   Car transfer assist level: Supervision/Verbal cueing      Care Tool Locomotion Ambulation   Assist level: Contact Guard/Touching assist Assistive device: Walker-rolling Max distance: 150  Walk 10 feet activity   Assist level: Contact Guard/Touching assist Assistive  device: Walker-rolling   Walk 50 feet with 2 turns activity   Assist level: Contact Guard/Touching assist Assistive device: Walker-rolling  Walk 150 feet activity   Assist level: Contact Guard/Touching assist Assistive device: Walker-rolling  Walk 10 feet on uneven surfaces activity   Assist level: Contact Guard/Touching assist Assistive device: Walker-rolling  Stairs   Assist level: Contact Guard/Touching assist Stairs assistive device: 2 hand rails Max number of stairs: 4  Walk up/down 1 step activity   Walk up/down 1 step (curb) assist level: Contact Guard/Touching assist Walk up/down 1 step or curb assistive device: 2 hand rails  Walk up/down 4 steps activity   Walk up/down 4 steps assist level: Contact Guard/Touching assist Walk up/down 4 steps assistive device: 2 hand rails  Walk up/down 12 steps activity Walk up/down 12 steps activity did not occur: Safety/medical concerns      Pick up small objects from floor Pick up small object from the floor (from standing position) activity did not occur: Safety/medical concerns      Wheelchair Is the patient using a wheelchair?: No          Wheel 50 feet with 2 turns activity      Wheel 150 feet activity        Refer to Care Plan for Long Term Goals  SHORT TERM GOAL WEEK 1 PT Short Term Goal 1 (Week 1): none d/t ELOS  Recommendations for other services: None   Skilled Therapeutic Intervention Evaluation completed (see details above) with patient education regarding purpose of PT evaluation, PT POC and goals, therapy schedule, weekly team meetings, and other CIR information including safety plan and fall risk safety.No complaint of pain. Pt most limited by SOB, with O2 sats dropping as low as 92% with activity and rebounding quickly. Provided education on standing rest breaks and pursed lip breathing, as well as RPE for improving endurance. Pt demoed understanding.  Pt performed the below functional mobility tasks with the  specified levels of skilled cuing and assistance.  Pt returned to recliner at end of session and remained with needs in reach.   Mobility Transfers Transfers: Pharmacologist;Sit to Stand Sit to Stand: Contact Guard/Touching assist Stand Pivot Transfers: Contact Guard/Touching assist Transfer (Assistive device): Rolling walker Locomotion  Gait Ambulation: Yes Gait Assistance: Contact Guard/Touching assist Gait Distance (Feet): 150  Feet Assistive device: Rolling walker Gait Gait: Yes Gait Pattern: Wide base of support Gait velocity: decreased Stairs / Additional Locomotion Stairs: Yes Stairs Assistance: Contact Guard/Touching assist Stair Management Technique: Two rails Number of Stairs: 4 Height of Stairs: 6 Ramp: Contact Guard/touching assist Wheelchair Mobility Wheelchair Mobility: No   Discharge Criteria: Patient will be discharged from PT if patient refuses treatment 3 consecutive times without medical reason, if treatment goals not met, if there is a change in medical status, if patient makes no progress towards goals or if patient is discharged from hospital.  The above assessment, treatment plan, treatment alternatives and goals were discussed and mutually agreed upon: by patient   Tex Filbert 08/30/2023, 12:21 PM

## 2023-08-30 NOTE — Progress Notes (Signed)
      [address]       [place] [address]             [phone]     Ms. Stoklosa reports she is doing well this morning, just returned from a walk. Reports she is getting her strength back and feels good. I changed her dressings this AM without complication. Vitals remained stable. Inferior sternal wound and chest tube sites are clean with granulation tissue. Continue daily wash with vashe and wet to dry dressing changes.   Randa Burton, PA-C 08/30/23

## 2023-08-31 DIAGNOSIS — R5381 Other malaise: Secondary | ICD-10-CM | POA: Diagnosis not present

## 2023-08-31 LAB — GLUCOSE, CAPILLARY
Glucose-Capillary: 121 mg/dL — ABNORMAL HIGH (ref 70–99)
Glucose-Capillary: 151 mg/dL — ABNORMAL HIGH (ref 70–99)
Glucose-Capillary: 71 mg/dL (ref 70–99)
Glucose-Capillary: 129 mg/dL — ABNORMAL HIGH (ref 70–99)

## 2023-08-31 LAB — PROTIME-INR
INR: 2.9 — ABNORMAL HIGH (ref 0.8–1.2)
Prothrombin Time: 30.3 s — ABNORMAL HIGH (ref 11.4–15.2)

## 2023-08-31 MED ORDER — WARFARIN SODIUM 5 MG PO TABS
5.0000 mg | ORAL_TABLET | Freq: Once | ORAL | Status: AC
  Administered 2023-08-31: 5 mg via ORAL
  Filled 2023-08-31: qty 1

## 2023-08-31 NOTE — Progress Notes (Signed)
 PHARMACY - ANTICOAGULATION CONSULT NOTE  Pharmacy Consult for warfarin Indication: onX mMVR  No Known Allergies  Patient Measurements: Height: 5\' 5"  (165.1 cm) Weight: 116.1 kg (255 lb 15.3 oz) IBW/kg (Calculated) : 57 HEPARIN  DW (KG): 84.4  Vital Signs: Temp: 98.4 F (36.9 C) (06/01 0608) Temp Source: Oral (06/01 0608) BP: 134/65 (06/01 0608) Pulse Rate: 87 (06/01 0608)  Labs: Recent Labs    08/28/23 1253 08/29/23 0400 08/30/23 0820 08/31/23 0805  HGB  --  9.3*  --   --   HCT  --  29.3*  --   --   PLT  --  404*  --   --   LABPROT  --  39.2* 34.0* 30.3*  INR  --  4.0* 3.3* 2.9*  CREATININE 1.11* 0.93  --   --     Estimated Creatinine Clearance: 77.8 mL/min (by C-G formula based on SCr of 0.93 mg/dL).   Medical History: Past Medical History:  Diagnosis Date   Anxiety    Breast cancer (HCC)    Diabetes mellitus without complication (HCC)    Hyperlipidemia    Hypertension    Type 2 diabetes mellitus without retinopathy (HCC) 06/17/2017    Medications:  Scheduled:   amiodarone   200 mg Oral Daily   anastrozole   1 mg Oral Daily   carvedilol   3.125 mg Oral BID WC   Chlorhexidine  Gluconate Cloth  6 each Topical Q12H   dapagliflozin  propanediol  10 mg Oral Daily   feeding supplement  237 mL Oral BID BM   insulin  aspart  0-15 Units Subcutaneous TID WC   insulin  aspart  3 Units Subcutaneous TID WC   insulin  glargine-yfgn  22 Units Subcutaneous BID   pantoprazole   40 mg Oral Daily   spironolactone   12.5 mg Oral Daily   torsemide   20 mg Oral Daily   Warfarin - Pharmacist Dosing Inpatient   Does not apply q1600    Assessment: 63 yo F presenting with acute mitral valve endocarditis with perforated mitral leaflet and severe MR. Underwent dental extraction 5/8. Presented for pre-op RHC and developed flash pulmonary edema requiring intubation - IABP was placed in lab and IV heparin  started. No AC PTA.  Pt now s/p cardiac surgery with onX mMVR placed. Pharmacy  consulted to start warfarin 5/16 after IABP removal 5/15. S/p DCCV 5/23 and maintaining NSR. Heparin  bridge dc'd once INR > 2.  Patient has transferred to inpatient rehab.  Pharmacy continuing with warfarin dosing.  Warfarin doses have been variable over the last week ranging from 2-10mg  and an elevated INR.  INR remains within goal.  Will attempt to establish stable regimen for discharge.  Goal of Therapy:  INR 2.5-3.5 Monitor platelets by anticoagulation protocol: Yes   Plan:  Repeat Warfarin 5mg  PO x 1 Daily INR and CBC   Melissa Bray, Pharm.D., BCPS Clinical Pharmacist  08/31/2023 11:51 AM

## 2023-08-31 NOTE — Discharge Summary (Signed)
 Physician Discharge Summary  Patient ID: Melissa Bray MRN: [id number] DOB/AGE: Oct 22, 1960 63 y.o.  Admit date: 08/29/2023 Discharge date: 09/05/2023  Discharge Diagnoses:  Principal Problem:   Debility Active Problems:   Difficulty coping with disease Bacterial endocarditis Mitral valve replacement Hypertension Atrial flutter with RVR Diabetes mellitus History of breast cancer Class III morbid obesity Mood stabilization  Discharged Condition: Stable  Significant Diagnostic Studies: US  EKG Site Rite Result Date: 09/03/2023 If Site Rite image not attached, placement could not be confirmed due to current cardiac rhythm.  ECHO TEE Result Date: 08/27/2023    TRANSESOPHOGEAL ECHO REPORT   Patient Name:   Melissa Bray Date of Exam: 08/22/2023 Medical Rec #:  [id number]     Height:       65.0 in Accession #:    [id number]    Weight:       261.9 lb Date of Birth:  1961/01/26     BSA:          2.218 m Patient Age:    62 years      BP:           94/52 mmHg Patient Gender: F             HR:           129 bpm. Exam Location:  Inpatient Procedure: 3D Echo, Transesophageal Echo, Cardiac Doppler and Color Doppler            (Both Spectral and Color Flow Doppler were utilized during            procedure). Indications:     I48.92* Unspecified atrial flutter  History:         Patient has prior history of Echocardiogram examinations.                  Abnormal ECG, Arrythmias:Atrial Flutter and Tachycardia; Risk                  Factors:Hypertension, Diabetes and Dyslipidemia. Shock. Mitral                  valve perforation. MVR. Breast cancer.                   Mitral Valve: 31 mm ON-X PROSTHETIC MITRAL HEART VALVE SIZE                  31/33MM valve is present in the mitral position. Procedure                  Date: 08/13/2023.  Sonographer:     Raynelle Callow RDCS Referring Phys:  [id number] Marlo Simpler SIMMONS Diagnosing Phys: Arta Lark PROCEDURE: After discussion of the risks and benefits of a TEE, an informed  consent was obtained from the patient. The transesophogeal probe was passed without difficulty through the esophogus of the patient. Imaged were obtained with the patient in a left lateral decubitus position. Sedation performed by different physician. The patient was monitored while under deep sedation. Anesthestetic sedation was provided intravenously by Anesthesiology: 111mg  of Propofol , 100mg  of Lidocaine . The patient's vital signs; including heart rate, blood pressure, and oxygen  saturation; remained stable throughout the procedure. The patient developed no complications during the procedure. A successful direct current cardioversion was performed at 150 joules with 1 attempt.  IMPRESSIONS  1. Left atrial size was mildly dilated. No left atrial/left atrial appendage thrombus was detected.  2. The mitral valve has been repaired/replaced. Trivial mitral valve regurgitation. No evidence  of mitral stenosis. There is a ON-X PROSTHETIC MITRAL HEART VALVE SIZE 31/33MM present in the mitral position. Procedure Date: 08/13/2023. Echo findings are consistent with normal structure and function of the mitral valve prosthesis.  3. Large pleural effusion.  4. The aortic valve is normal in structure. Aortic valve regurgitation is not visualized. No aortic stenosis is present.  5. 3D performed of the mitral valve and demonstrates Normal function of mechanical mitral valve.  6. Right ventricular systolic function was not well visualized. The right ventricular size is not well visualized.  7. Left ventricular ejection fraction, by estimation, is 35 to 40%. The left ventricle has moderately decreased function. There is mild concentric left ventricular hypertrophy. FINDINGS  Left Ventricle: Left ventricular ejection fraction, by estimation, is 35 to 40%. The left ventricle has moderately decreased function. The left ventricular internal cavity size was normal in size. There is mild concentric left ventricular hypertrophy. Right  Ventricle: The right ventricular size is not well visualized. No increase in right ventricular wall thickness. Right ventricular systolic function was not well visualized. Left Atrium: Small residual pouch post surgical clip, <1cm. Left atrial size was mildly dilated. No left atrial/left atrial appendage thrombus was detected. Right Atrium: Right atrial size was normal in size. Pericardium: There is no evidence of pericardial effusion. Mitral Valve: The mitral valve has been repaired/replaced. Trivial mitral valve regurgitation. There is a 31 mm ON-X PROSTHETIC MITRAL HEART VALVE SIZE 31/33MM present in the mitral position. Procedure Date: 08/13/2023. Echo findings are consistent with normal structure and function of the mitral valve prosthesis. No evidence of mitral valve stenosis. MV peak gradient, 6.2 mmHg. The mean mitral valve gradient is 3.0 mmHg. Tricuspid Valve: The tricuspid valve is normal in structure. Tricuspid valve regurgitation is mild . No evidence of tricuspid stenosis. Aortic Valve: The aortic valve is normal in structure. Aortic valve regurgitation is not visualized. No aortic stenosis is present. Aortic valve mean gradient measures 3.0 mmHg. Aortic valve peak gradient measures 4.8 mmHg. Pulmonic Valve: The pulmonic valve was normal in structure. Pulmonic valve regurgitation is trivial. No evidence of pulmonic stenosis. Aorta: The aortic root and ascending aorta are structurally normal, with no evidence of dilitation. IAS/Shunts: No atrial level shunt detected by color flow Doppler. Additional Comments: There is a large pleural effusion. Spectral Doppler performed. AORTIC VALVE AV Vmax:      110.00 cm/s AV Vmean:     75.600 cm/s AV VTI:       0.140 m AV Peak Grad: 4.8 mmHg AV Mean Grad: 3.0 mmHg MITRAL VALVE MV Peak grad: 6.2 mmHg MV Mean grad: 3.0 mmHg MV Vmax:      1.24 m/s MV Vmean:     78.5 cm/s Arta Lark Electronically signed by Arta Lark Signature Date/Time: 08/27/2023/12:22:23 PM     Final    ECHOCARDIOGRAM COMPLETE Result Date: 08/23/2023    ECHOCARDIOGRAM REPORT   Patient Name:   [facility] Date of Exam: 08/23/2023 Medical Rec #:  [id number]     Height:       65.0 in Accession #:    [id number]    Weight:       252.7 lb Date of Birth:  04-Sep-1960     BSA:          2.185 m Patient Age:    62 years      BP:           148/84 mmHg Patient Gender: F  HR:           81 bpm. Exam Location:  Inpatient Procedure: 2D Echo, Cardiac Doppler and Color Doppler (Both Spectral and Color            Flow Doppler were utilized during procedure). Indications:    CHF- Acute Systolic I50.21  History:        Patient has prior history of Echocardiogram examinations, most                 recent 08/03/2023. Arrythmias:Tachycardia; Risk                 Factors:Hypertension, Diabetes and Dyslipidemia.  Sonographer:    Terrilee Few RCS Referring Phys: [phone] DALTON S MCLEAN IMPRESSIONS  1. Left ventricular ejection fraction, by estimation, is 55 to 60%. The left ventricle has normal function. The left ventricle has no regional wall motion abnormalities. There is mild concentric left ventricular hypertrophy. Left ventricular diastolic parameters are indeterminate.  2. Mildly D-shaped interventricular septum suggestive of a degree of pressure/volume overload. Right ventricular systolic function is mildly reduced. The right ventricular size is mildly enlarged. There is mildly elevated pulmonary artery systolic pressure. The estimated right ventricular systolic pressure is 40.6 mmHg.  3. Left atrial size was mildly dilated.  4. On-X mechanical mitral valve with mean gradient 3 mmHg. No significant regurgitation.  5. The aortic valve is tricuspid. Aortic valve regurgitation is not visualized. No aortic stenosis is present.  6. The inferior vena cava is dilated in size with <50% respiratory variability, suggesting right atrial pressure of 15 mmHg.  7. Moderate pericardial effusion. There is no evidence of cardiac  tamponade. FINDINGS  Left Ventricle: Left ventricular ejection fraction, by estimation, is 55 to 60%. The left ventricle has normal function. The left ventricle has no regional wall motion abnormalities. The left ventricular internal cavity size was normal in size. There is  mild concentric left ventricular hypertrophy. Left ventricular diastolic parameters are indeterminate. Right Ventricle: Mildly D-shaped interventricular septum suggestive of a degree of pressure/volume overload. The right ventricular size is mildly enlarged. No increase in right ventricular wall thickness. Right ventricular systolic function is mildly reduced. There is mildly elevated pulmonary artery systolic pressure. The tricuspid regurgitant velocity is 2.53 m/s, and with an assumed right atrial pressure of 15 mmHg, the estimated right ventricular systolic pressure is 40.6 mmHg. Left Atrium: Left atrial size was mildly dilated. Right Atrium: Right atrial size was normal in size. Pericardium: A moderately sized pericardial effusion is present. There is no evidence of cardiac tamponade. Mitral Valve: On-X mechanical mitral valve with mean gradient 3 mmHg. No significant regurgitation. The mitral valve has been repaired/replaced. No evidence of mitral valve regurgitation. MV peak gradient, 7.2 mmHg. The mean mitral valve gradient is 2.5 mmHg. Tricuspid Valve: The tricuspid valve is normal in structure. Tricuspid valve regurgitation is mild. Aortic Valve: The aortic valve is tricuspid. Aortic valve regurgitation is not visualized. No aortic stenosis is present. Aortic valve peak gradient measures 8.5 mmHg. Pulmonic Valve: The pulmonic valve was normal in structure. Pulmonic valve regurgitation is trivial. Aorta: The aortic root is normal in size and structure. Venous: The inferior vena cava is dilated in size with less than 50% respiratory variability, suggesting right atrial pressure of 15 mmHg. IAS/Shunts: No atrial level shunt detected by  color flow Doppler.  LEFT VENTRICLE PLAX 2D LVIDd:         3.90 cm LVIDs:         2.70 cm LV  PW:         1.20 cm LV IVS:        0.80 cm LVOT diam:     1.80 cm LV SV:         36 LV SV Index:   16 LVOT Area:     2.54 cm  RIGHT VENTRICLE            IVC RV S prime:     7.05 cm/s  IVC diam: 1.80 cm TAPSE (M-mode): 1.4 cm LEFT ATRIUM             Index        RIGHT ATRIUM           Index LA diam:        4.70 cm 2.15 cm/m   RA Area:     10.30 cm LA Vol (A2C):   54.9 ml 25.13 ml/m  RA Volume:   27.70 ml  12.68 ml/m LA Vol (A4C):   73.4 ml 33.62 ml/m LA Biplane Vol: 63.3 ml 28.97 ml/m  AORTIC VALVE AV Area (Vmax): 1.59 cm AV Vmax:        146.00 cm/s AV Peak Grad:   8.5 mmHg LVOT Vmax:      91.50 cm/s LVOT Vmean:     57.700 cm/s LVOT VTI:       0.141 m  AORTA Ao Root diam: 3.10 cm Ao Asc diam:  3.60 cm MITRAL VALVE                TRICUSPID VALVE MV Area (PHT): 3.65 cm     TR Peak grad:   25.6 mmHg MV Area VTI:   1.24 cm     TR Vmax:        253.00 cm/s MV Peak grad:  7.2 mmHg MV Mean grad:  2.5 mmHg     SHUNTS MV Vmax:       1.34 m/s     Systemic VTI:  0.14 m MV Vmean:      78.5 cm/s    Systemic Diam: 1.80 cm MV Decel Time: 208 msec MV E velocity: 141.00 cm/s MV A velocity: 54.00 cm/s MV E/A ratio:  2.61 Dalton McleanMD Electronically signed by Archer Bear Signature Date/Time: 08/23/2023/12:55:50 PM    Final    EP STUDY Result Date: 08/22/2023 See surgical note for result.  US  EKG SITE RITE Result Date: 08/18/2023 If Site Rite image not attached, placement could not be confirmed due to current cardiac rhythm.  DG CHEST PORT 1 VIEW Result Date: 08/18/2023 CLINICAL DATA:  Status post chest tube removal. EXAM: PORTABLE CHEST 1 VIEW COMPARISON:  Chest radiograph dated 08/15/2025. FINDINGS: Enteric tube extends below diaphragm with tip beyond the inferior margin of the image. Left IJ central venous line with tip at the cavoatrial junction. Right IJ catheter with tip over upper SVC. Improved aeration of the  lungs compared to prior radiograph. There is left lung base atelectasis. Pneumonia is not excluded. No large pleural effusion. No pneumothorax. Stable cardiomegaly. Median sternotomy wires and mechanical cardiac valve. No acute osseous pathology. IMPRESSION: 1. Improved aeration of the lungs. 2. Left lung base atelectasis. Electronically Signed   By: Angus Bark M.D.   On: 08/18/2023 09:17   US  EKG SITE RITE Result Date: 08/17/2023 If Site Rite image not attached, placement could not be confirmed due to current cardiac rhythm.  DG Chest Port 1 View Result Date: 08/16/2023 CLINICAL DATA:  [id number].  Status post MVR. EXAM: PORTABLE CHEST  1 VIEW COMPARISON:  Portable chest yesterday at 5:21 a.m. FINDINGS: 5:37 a.m. Feeding tube passes into the stomach with the tip not included. Stable right IJ Swan-Ganz line tip in the right pulmonary artery. A left IJ central line terminates at the superior cavoatrial junction as before. Left atrial appendage clip. Two right chest tubes are stable in positioning. Sternotomy and MVR are again shown. Cardiomegaly. Mediastinum is stable. Central vessel prominence continues, with mild but improved central edema, small pleural effusions. There is patchy opacity in the lung bases consistent with atelectasis or consolidation, bilateral perihilar linear platelike atelectasis. No new or worsened lung opacity is seen. No essential change in the overall aeration. IMPRESSION: 1. Stable support apparatus. 2. Cardiomegaly with mild but improved central edema. 3. Small pleural effusions. 4. Patchy opacity in the lung bases consistent with atelectasis or consolidation. No new or worsened lung opacity is seen. Electronically Signed   By: Denman Fischer M.D.   On: 08/16/2023 07:33   DG Chest Port 1 View Result Date: 08/15/2023 CLINICAL DATA:  Status post mitral bowel replacement. EXAM: PORTABLE CHEST 1 VIEW COMPARISON:  Chest radiograph dated 08/14/2023. FINDINGS: Interval removal of the  endotracheal and enteric tubes. Additional support apparatus in similar position. Diffuse density in the right lung and left lung base, new or increased since the prior radiograph and may represent combination of pleural effusion and edema. No pneumothorax. Stable cardiomediastinal silhouette. No acute osseous pathology. IMPRESSION: 1. Interval removal of the endotracheal and enteric tubes. 2. Interval development of vascular congestion and pleural effusions, right greater than left. Electronically Signed   By: Angus Bark M.D.   On: 08/15/2023 09:23   DG Chest Port 1 View Result Date: 08/14/2023 CLINICAL DATA:  [id number].  Mitral valve replacement surgery. EXAM: PORTABLE CHEST 1 VIEW COMPARISON:  Portable chest yesterday at 6:19 p.m. FINDINGS: 5:57 a.m. ETT tip is 2.9 cm from the carina. NGT curves left in the stomach with the tip proximal fundal area. Right IJ Swan-Ganz catheter again terminates to the right probably in the main lower lobe artery. Left IJ line tip is at the superior cavoatrial junction. There are stable postsurgical changes with AVR, left atrial appendage clip, sternotomy sutures. There is a right chest tube and at least 1 mediastinal drain to the right. Stable cardiomediastinal silhouette with superior widening, mild cardiomegaly. Normal caliber central vessels. Stable appearance of low lung volumes and bibasilar atelectasis. Remaining lungs are clear. Overall aeration seems unchanged.  No new abnormality. IMPRESSION: 1. Stable appearance of the chest with low lung volumes and bibasilar atelectasis. No new abnormality. 2. Stable support apparatus. 3. Stable cardiomediastinal silhouette. Electronically Signed   By: Denman Fischer M.D.   On: 08/14/2023 08:02   DG Chest Port 1 View Result Date: 08/13/2023 CLINICAL DATA:  [id number] S/P MVR (mitral valve replacement) [id number] EXAM: PORTABLE CHEST - 1 VIEW COMPARISON:  Aug 13, 2023 FINDINGS: Low lung volumes. Mild elevation the right hemidiaphragm.  Streaky bibasilar atelectasis. No pleural effusion or pneumothorax. Mild cardiomegaly. Mitral valve replacement with left atrial appendage clip. Endotracheal tube terminates in the mid trachea. Sternotomy wires. Right IJ introducer sheath with a pulmonary artery catheter in the right hilar region. Left IJ approach central venous catheter terminates in the lower SVC. Esophagogastric tube terminating in the stomach. Mediastinal drain and right-sided thoracostomy tube are well-positioned. IMPRESSION: 1. Low lung volumes with streaky bibasilar atelectasis. Minimal atelectasis in the left upper lung zone. No pleural effusion or pneumothorax. 2. Similar positioning of the support  tubes and lines, as described above. Electronically Signed   By: Rance Burrows M.D.   On: 08/13/2023 18:54   ECHO INTRAOPERATIVE TEE Result Date: 08/13/2023  *INTRAOPERATIVE TRANSESOPHAGEAL REPORT *  Patient Name:   GRACEY TOLLE  Date of Exam: 08/13/2023 Medical Rec #:  [id number]      Height:       65.0 in Accession #:    [id number]     Weight:       254.6 lb Date of Birth:  08-May-1960      BSA:          2.19 m Patient Age:    62 years       BP:           110/68 mmHg Patient Gender: F              HR:           110 bpm. Exam Location:  Anesthesiology Transesophogeal exam was perform intraoperatively during surgical procedure. Patient was closely monitored under general anesthesia during the entirety of examination. Indications:     I34.0 Nonrheumatic mitral (valve) insufficiency Performing Phys: Raymondo Calin, MD Complications: No known complications during this procedure. POST-OP IMPRESSIONS _ Left Ventricle: The left ventricle is unchanged from pre-bypass. _ Right Ventricle: The right ventricle appears unchanged from pre-bypass. _ Aorta: The aorta appears unchanged from pre-bypass. _ Left Atrial Appendage: The left atrial appendage appears unchanged from pre-bypass. _ Aortic Valve: The aortic valve appears unchanged from pre-bypass. _  Mitral Valve: No stenosis present. No regurgitation post repair. The gradient recorded across the prosthetic valve is within the expected range. No perivalvular leak noted. _ Tricuspid Valve: The tricuspid valve appears unchanged from pre-bypass. _ Pulmonic Valve: The pulmonic valve appears unchanged from pre-bypass. _ Interatrial Septum: The interatrial septum appears unchanged from pre-bypass. _ Pericardium: The pericardium appears unchanged from pre-bypass. PRE-OP FINDINGS  Left Ventricle: The left ventricle has normal systolic function, with an ejection fraction of 55-60%. The cavity size was normal. There is no increase in left ventricular wall thickness. Right Ventricle: The right ventricle has normal systolic function. The cavity was normal. There is no increase in right ventricular wall thickness. Left Atrium: Left atrial size was normal in size. No left atrial/left atrial appendage thrombus was detected. Right Atrium: Right atrial size was normal in size. Interatrial Septum: No atrial level shunt detected by color flow Doppler. Pericardium: There is no evidence of pericardial effusion. Mitral Valve: Anterior leaflet perforation. Anterior leaflet vegetation. Mitral valve regurgitation is severe by color flow Doppler. Tricuspid Valve: The tricuspid valve was normal in structure. Tricuspid valve regurgitation was not visualized by color flow Doppler. Aortic Valve: The aortic valve is normal in structure. Aortic valve regurgitation was not visualized by color flow Doppler. There is no stenosis of the aortic valve. Pulmonic Valve: The pulmonic valve was normal in structure. Pulmonic valve regurgitation is not visualized by color flow Doppler. Aorta: The aortic root, ascending aorta and aortic arch are normal in size and structure. IABP visualized in the descending aorta.  Raymondo Calin MD Electronically signed by Raymondo Calin MD Signature Date/Time: 08/13/2023/6:08:41 PM    Final    DG Chest Port 1  View Result Date: 08/13/2023 CLINICAL DATA:  Intraoperative evaluation, incorrect instrument count EXAM: PORTABLE CHEST 1 VIEW COMPARISON:  08/12/2023 FINDINGS: Two frontal views of the chest are obtained. Endotracheal tube overlies tracheal air column, tip 2.3 cm above carina. Left internal jugular catheter tip overlies superior vena cava.  Right internal jugular flow directed catheter tip overlies the main pulmonary outflow tract. Endoscope overlies the thoracic esophagus, tip just proximal to the gastroesophageal junction. Mitral valve prosthesis, numerous mediastinal surgical clips, and left atrial appendage clip identified. No unexpected radiopaque foreign body. The cardiac silhouette is unremarkable. Chronic elevation of the right hemidiaphragm, with consolidation at the right lung base consistent with atelectasis. No effusion or pneumothorax. IMPRESSION: 1. Intraoperative evaluation with numerous support devices as above. No unexpected radiopaque foreign body. 2. Right basilar consolidation consistent with atelectasis. Findings were called to operating room 14 at 4:19 p.m. by myself. Electronically Signed   By: Bobbye Burrow M.D.   On: 08/13/2023 16:24   DG CHEST PORT 1 VIEW Result Date: 08/12/2023 CLINICAL DATA:  Heart failure. EXAM: PORTABLE CHEST 1 VIEW COMPARISON:  Chest radiograph dated 08/12/2023. FINDINGS: Left IJ central venous line with tip at the cavoatrial junction. Interval advancement of the IVC catheter with tip over the left hilum. No significant interval change in aeration of the lungs. No pleural effusion or pneumothorax. Stable cardiomediastinal silhouette no acute osseous pathology. IMPRESSION: Interval advancement of the IVC catheter with tip over the left hilum. Electronically Signed   By: Angus Bark M.D.   On: 08/12/2023 16:36   DG CHEST PORT 1 VIEW Result Date: 08/12/2023 CLINICAL DATA:  Intra-aortic balloon pump EXAM: PORTABLE CHEST 1 VIEW COMPARISON:  Aug 11, 2023  FINDINGS: No change in the left IJ CVP line Persistent bilateral congestive changes Inferior vena cava catheter via the outflow portion of the right ventricle into the main pulmonary artery. Correlates with the Swan-Ganz catheter pulled back into the main pulmonary artery. No change in the position of the intra-aortic balloon pump marker No change in the heart size. No pneumothorax IMPRESSION: Inferior vena cava catheter via the outflow portion of the right ventricle into the main pulmonary artery. Correlates with the Swan-Ganz catheter pulled back into the main pulmonary artery. Electronically Signed   By: Fredrich Jefferson M.D.   On: 08/12/2023 08:40   DG CHEST PORT 1 VIEW Result Date: 08/11/2023 CLINICAL DATA:  Aspiration, heart failure. EXAM: PORTABLE CHEST 1 VIEW COMPARISON:  08/11/2023 at 0515 hours. FINDINGS: Interval extubation. Left IJ central line tip is in the SVC. IVC Swan-Ganz catheter is in the interlobar pulmonary artery. Intra-aortic balloon pump projects roughly 6.7 cm below the aortic arch. Heart size stable. Mild perihilar interstitial prominence and indistinctness. Bibasilar volume loss. No definite pleural fluid. IMPRESSION: 1. Mild pulmonary edema. 2. Intra-aortic balloon pump located roughly 6 cm below the aortic arch. Electronically Signed   By: Shearon Denis M.D.   On: 08/11/2023 19:12   VAS US  DOPPLER PRE CABG Result Date: 08/11/2023 PREOPERATIVE VASCULAR EVALUATION Patient Name:  MOIRA UMHOLTZ  Date of Exam:   08/11/2023 Medical Rec #: [id number]      Accession #:    [id number] Date of Birth: December 16, 1960      Patient Gender: F Patient Age:   49 years Exam Location:  [facility] Procedure:      VAS US  DOPPLER PRE CABG Referring Phys: Donata Fryer [place] --------------------------------------------------------------------------------  Indications:      Pre-Op Mitral valve replacement. Risk Factors:     Hypertension, hyperlipidemia, Diabetes. Other Factors:    Mitral valve endocarditis.  Limitations:      Balloon pump, line in left neck (IJV) Comparison Study: No prior study Performing Technologist: Carleene Chase RVS  Examination Guidelines: A complete evaluation includes B-mode imaging, spectral Doppler, color Doppler, and power Doppler  as needed of all accessible portions of each vessel. Bilateral testing is considered an integral part of a complete examination. Limited examinations for reoccurring indications may be performed as noted.  Right Carotid Findings: No plaque or stenosis noted. Patient on Balloon pump, not able to get adequate velocities. Left Carotid Findings: No plaque or stenosis noted in the CCA. Unable to get velocities secondary to balloon pump. Unable to visualize ICA or ECA secondary to IJ line/bandages   Summary: Right Carotid: No plaque or stenosis noted. Patient on Balloon pump, not able to                get adequate velocities. Left Carotid: No plaque or stenosis noted in the CCA. Unable to get velocities               secondary to balloon pump. Unable to visualize ICA or ECA               secondary to IJ line/bandages.  Electronically signed by Irvin Mantel on 08/11/2023 at 5:24:08 PM.    Final    DG CHEST PORT 1 VIEW Result Date: 08/11/2023 CLINICAL DATA:  CHF EXAM: PORTABLE CHEST 1 VIEW COMPARISON:  Yesterday FINDINGS: Endotracheal tube with tip just below the clavicular heads. Left IJ line with tip at the upper cavoatrial junction. Swan-Ganz catheter from below with tip over the right main pulmonary artery level. Aortic balloon pump with tip nearly 5 cm below the inferior margin of the aortic arch. Low volume chest with hazy density likely from atelectasis and pleural fluid. No pneumothorax. Vascular congestion. Extensive artifact from EKG leads. IMPRESSION: Unchanged hardware as described. Stable low volume chest with hazy density favoring atelectasis and pleural fluid. Electronically Signed   By: Ronnette Coke M.D.   On: 08/11/2023 09:06   DG CHEST PORT 1  VIEW Result Date: 08/10/2023 CLINICAL DATA:  Intra-aortic balloon pump. EXAM: PORTABLE CHEST 1 VIEW COMPARISON:  Radiographs 08/09/2023 and 08/08/2023. FINDINGS: 0639 hours. Tip of the endotracheal tube overlies the mid trachea. Enteric tube projects below the diaphragm, tip not visualized. Left IJ Swan-Ganz catheter projects over the proximal right pulmonary artery. The marker for the intra-balloon pump is now more inferiorly positioned, proximally 9.6 cm inferior to the top of the aortic arch. The heart size and mediastinal contours are stable. Pulmonary edema appears mildly improved. Persistent bilateral pleural effusions and associated probable bibasilar atelectasis. No evidence of pneumothorax or acute osseous abnormality. IMPRESSION: 1. The marker for the intra-balloon pump is now more inferiorly positioned, approximately 9.6 cm inferior to the top of the aortic arch. 2. Otherwise unchanged position of the support system. 3. Mildly improved pulmonary edema with persistent bilateral pleural effusions and associated probable bibasilar atelectasis. Electronically Signed   By: Elmon Hagedorn M.D.   On: 08/10/2023 10:31   DG Chest Port 1 View Result Date: 08/09/2023 CLINICAL DATA:  Acute intra-aortic balloon pump. History of mitral valve endocarditis. EXAM: PORTABLE CHEST 1 VIEW COMPARISON:  08/08/2023 FINDINGS: There is a left IJ catheter with tip at the superior cavoatrial junction. ETT tip is stable above the carina. Enteric tube tip courses below the GE junction. Marker for the intra-balloon pump is just below the aortic knob. Stable cardiomediastinal contours. There is been interval improvement in aeration to both upper lung zones. Persistent bilateral lower lobe opacities. IMPRESSION: 1. Interval improvement in aeration to both upper lung zones. 2. Persistent bilateral lower lobe opacities. 3. Support apparatus as above. Electronically Signed   By: Carolynne Citron  Martin Slay M.D.   On: 08/09/2023 10:06   Port  CXR Result Date: 08/08/2023 CLINICAL DATA:  Central line placement. EXAM: PORTABLE CHEST 1 VIEW COMPARISON:  07/31/2023 FINDINGS: Endotracheal tube tip 2.3 cm from the carina. Tip and side port of the enteric tube below the diaphragm in the stomach. Left internal jugular central line tip overlies the lower SVC. Swan-Ganz catheter from an inferior approach with tip in the region of the main pulmonary outflow tract. Multiple overlying monitoring devices in place. Stable heart size and mediastinal contours. Ill-defined opacities within both perihilar regions and at the left lung apex. No pneumothorax. IMPRESSION: 1. Left internal jugular central line tip overlies the lower SVC. No pneumothorax. Endotracheal and enteric tubes in place. 2. Swan-Ganz catheter from an inferior approach with tip in the region of the main pulmonary outflow tract. 3. Ill-defined opacities within both perihilar regions and at the left lung apex, may represent edema or infection. Probable pleural effusions. Electronically Signed   By: Chadwick Colonel M.D.   On: 08/08/2023 17:33   CARDIAC CATHETERIZATION Result Date: 08/08/2023   LV end diastolic pressure is normal.   Hemodynamic findings consistent with mild pulmonary hypertension. Normal coronary anatomy. Normal LV filling pressures. LVEDP 12 mm Hg. PCWP 9/10, mean 11 mm Hg Mild pulmonary HTN. PAP 34/21, mean 27 mm Hg Cardiac output 4.74 L/min, index 2.14 5.   Successful placement of IABP Plan: transfer to ICU for further management per CCM and Advanced heart failure team.    Labs:  Basic Metabolic Panel: Recent Labs  Lab 09/01/23 0550  NA 136  K 3.9  CL 104  CO2 26  GLUCOSE 74  BUN 12  CREATININE 1.02*  CALCIUM  8.7*    CBC: Recent Labs  Lab 09/01/23 0550  WBC 4.9  NEUTROABS 2.6  HGB 9.1*  HCT 28.8*  MCV 91.4  PLT 290    CBG: Recent Labs  Lab 09/03/23 1951 09/04/23 0620 09/04/23 1235 09/04/23 1708 09/04/23 2024  GLUCAP 137* 75 148* 126* 111*   Family  history.  Mother with asthma father with prostate cancer and diabetes well as hypertension.  Denies any esophageal cancer or rectal cancer or stomach cancer  Brief HPI:   Melissa Bray is a 63 y.o. right-handed female with history significant for type 2 diabetes mellitus hypertension hyperlipidemia breast cancer status post left mastectomy 2016 class III morbid obesity BMI 42.67.  Per chart review lives with son.  1 level home 2 steps to enter.  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  Presented 07/31/2023 to [facility] for suspected sepsis due to left upper extremity cellulitis.  She was found to have significant edema to left upper extremity was treated for sepsis cellulitis with a murmur noted.  She was transferred to [facility] and a TEE was performed on 5/5 which demonstrated severe mitral regurgitation due to endocarditis and ejection fraction of 55 to 60%.  Patient also had extraction of teeth 5/8.  The following day she deteriorated went to cardiogenic shock.  She was intubated and intra-aortic balloon pump was placed and ultimately underwent mitral valve replacement 08/12/2023 per Dr. Luna Salinas and placed on Coumadin  therapy.  She underwent TEE and cardioversion 08/22/2023.  Sternal precautions as indicated.  Both cardiothoracic surgery and heart failure team continue to follow.  She was placed on penicillin  G per infectious disease recommendations of 6 weeks initiated 08/13/2023 through 09/24/2023.  Evaluations completed due to patient decreased functional mobility debility and multiple medical issues  was admitted for a comprehensive rehab program.   Hospital Course: Adeleine Garbett was admitted to rehab 08/29/2023 for inpatient therapies to consist of PT, ST and OT at least three hours five days a week. Past admission physiatrist, therapy team and rehab RN have worked together to provide customized collaborative inpatient rehab.  Pertaining to patient's debility after bacterial  endocarditis/status post mitral valve replacement 08/12/2023 with multiple associated complications.  Patient remained on Coumadin  therapy with INR range of 2.5-3.5.  She would follow-up cardiothoracic surgery.  No bleeding episodes.  Pain control with the use of oxycodone  as needed.  Mood stabilization with Klonopin  as well as melatonin.  In regards to patient's bacterial endocarditis should remain on penicillin  G through 09/24/2023 followed by infectious disease.  Blood pressure controlled on current regimen followed by heart failure team exhibiting no signs of fluid overload.  Atrial flutter with RVR status post DCCV 5/23 maintaining sinus rhythm amiodarone  as indicated no chest pain or shortness of breath.  Blood sugars had some initial variables hemoglobin A1c 12.8 insulin  therapy prior to admission patient on Glucotrol  and Lantus  insulin  diabetic teaching follow-up outpatient.  History of breast cancer status post left mastectomy 2016 follow-up outpatient continued on Arimidex .  Class III morbid obesity BMI 42.67 with dietary follow-up.   Blood pressures were monitored on TID basis and controlled and monitored  Diabetes has been monitored with ac/hs CBG checks and SSI was use prn for tighter BS control.    Rehab course: During patient's stay in rehab weekly team conferences were held to monitor patient's progress, set goals and discuss barriers to discharge. At admission, patient required contact-guard assist 240 feet rolling walker minimal assist contact-guard step pivot transfers  Physical exam.  Blood pressure 156/65 pulse 84 temperature 96 respirations 20 oxygen  saturation 100% room air Constitutional.  No acute distress HEENT Head.  Normocephalic and atraumatic Eyes.  Pupils round and reactive to light no discharge without nystagmus Neck.  Supple nontender no JVD without thyromegaly Cardiac regular rate and rhythm without extra sounds or murmur heard Abdomen.  Soft nontender positive bowel  sounds without rebound Skin.  Warm and dry.  Old left mastectomy scar Neurologic.  Alert oriented x 3 memory intact.  MMT bilateral upper extremities 4+ to 5/5 proximal to distal bilateral lower extremities 4 hip flexors knee extension and 4+ ADF PF.  DTRs 1+  He/She  has had improvement in activity tolerance, balance, postural control as well as ability to compensate for deficits. He/She has had improvement in functional use RUE/LUE  and RLE/LLE as well as improvement in awareness.  Working with energy conservation techniques.  Ambulates recliner to bedside commode over toilet with rollator close supervision contact-guard.  Ambulates 138-367 feet with rollator and supervision.  Navigates eight 6 inch steps with right upper extremity support contact-guard.  Ambulates from her room to the main gym with rollator and supervision.  Perform sit to stand and short distance mobility transfers with rollator and standby assist.  Seated oral hygiene grooming tasks supervision.  She does adhere to her sternal precautions.  Full family teaching completed and plan discharged home       Disposition:  Discharge disposition: 06-Home-Health Care Svc        Diet: Diabetic diet  Special Instructions: No driving smoking or alcohol  Sternal precautions.  Wash incision with soap and water then replace with Vashe wet-to-dry dressing.  Prothrombin times 09/05/2023 with PT 36.9 INR 3.7   Follow-up [place] [facility]/[place] [phone]  for prothrombin time/INR 09/08/2023 with goal INR 2.5-3.5    Medications at discharge. 1.  Tylenol  as needed 2.  Amiodarone  200 mg daily 3.  Arimidex  1 mg p.o. daily 4.  Coreg  6.25 mg p.o. twice daily 5.  Klonopin  0.5 mg p.o. twice daily as needed anxiety 6.  Farxiga  10 mg p.o. daily 7.  Lantus  insulin  20 units twice daily 8.  Melatonin 5 mg nightly as needed sleep 9.  Oxycodone  5 to 10 mg every 3 hours as needed 10.  Penicillin  G 12,000,000 units every  12 hours through 09/24/2023 and stop 11.  Aldactone  12.5 mg p.o. daily 12.  Demadex  20 mg p.o. daily 13.  Glucotrol  XL 10 mg p.o. daily 14.  Pravachol  20 mg p.o. daily 15.  Fosamax  70 mg weekly 16.  Coumadin  2.5 mg Monday and Friday and 5 mg Tuesday Wednesday Thursday Saturday Sunday   Discharge Instructions     [facility] pharmacist to adjust dose for Vancomycin , Aminoglycosides and other anti-infective therapies as requested by physician.   Complete by: As directed    [facility] pharmacist to adjust dose for Vancomycin , Aminoglycosides and other anti-infective therapies as requested by physician.   Complete by: As directed    [facility] to provide Cath Flo 2mg    Complete by: As directed    Administer for PICC line occlusion and as ordered by physician for other access device issues.   [facility] to provide Cath Flo 2mg    Complete by: As directed    Administer for PICC line occlusion and as ordered by physician for other access device issues.   Anaphylaxis Kit: Provided to treat any anaphylactic reaction to the medication being provided to the patient if First Dose or when requested by physician   Complete by: As directed    Epinephrine  1mg /ml vial / amp: Administer 0.3mg  (0.22ml) subcutaneously once for moderate to severe anaphylaxis, nurse to call physician and pharmacy when reaction occurs and call 911 if needed for immediate care   Diphenhydramine 50mg /ml IV vial: Administer 25-50mg  IV/IM PRN for first dose reaction, rash, itching, mild reaction, nurse to call physician and pharmacy when reaction occurs   Sodium Chloride  0.9% NS 500ml IV: Administer if needed for hypovolemic blood pressure drop or as ordered by physician after call to physician with anaphylactic reaction   Anaphylaxis Kit: Provided to treat any anaphylactic reaction to the medication being provided to the patient if First Dose or when requested by physician   Complete by:  As directed    Epinephrine  1mg /ml vial / amp: Administer 0.3mg  (0.20ml) subcutaneously once for moderate to severe anaphylaxis, nurse to call physician and pharmacy when reaction occurs and call 911 if needed for immediate care   Diphenhydramine 50mg /ml IV vial: Administer 25-50mg  IV/IM PRN for first dose reaction, rash, itching, mild reaction, nurse to call physician and pharmacy when reaction occurs   Sodium Chloride  0.9% NS 500ml IV: Administer if needed for hypovolemic blood pressure drop or as ordered by physician after call to physician with anaphylactic reaction   Change dressing on IV access line weekly and PRN   Complete by: As directed    Change dressing on IV access line weekly and PRN   Complete by: As directed    Flush IV access with Sodium Chloride  0.9% and Heparin  10 units/ml or 100 units/ml   Complete by: As directed    Flush IV access with Sodium Chloride  0.9% and Heparin  10 units/ml or 100 units/ml  Complete by: As directed    Home infusion instructions - [facility]   Complete by: As directed    Instructions: Flush IV access with Sodium Chloride  0.9% and Heparin  10units/ml or 100units/ml   Change dressing on IV access line: Weekly and PRN   Instructions Cath Flo 2mg : Administer for PICC Line occlusion and as ordered by physician for other access device   [facility] pharmacist to adjust dose for: Vancomycin , Aminoglycosides and other anti-infective therapies as requested by physician   Home infusion instructions - [facility]   Complete by: As directed    Instructions: Flush IV access with Sodium Chloride  0.9% and Heparin  10units/ml or 100units/ml   Change dressing on IV access line: Weekly and PRN   Instructions Cath Flo 2mg : Administer for PICC Line occlusion and as ordered by physician for other access device   [facility] pharmacist to adjust dose for: Vancomycin , Aminoglycosides and other anti-infective therapies as requested  by physician   Method of administration may be changed at the discretion of home infusion pharmacist based upon assessment of the patient and/or caregiver's ability to self-administer the medication ordered   Complete by: As directed    Method of administration may be changed at the discretion of home infusion pharmacist based upon assessment of the patient and/or caregiver's ability to self-administer the medication ordered   Complete by: As directed    Outpatient Parenteral Antibiotic Therapy Information Antibiotic: Penicillin  G IVPB; Indications for use: Group B endocarditis; End Date: 09/24/2023   Complete by: As directed    Antibiotic: Penicillin  G IVPB   Indications for use: Group B endocarditis   End Date: 09/24/2023     16.  Glucophage  850 mg p.o. twice daily 17.  Vitamin D  50,000 units weekly 18.  Coumadin     daily with INR range 2.5-3.5 19.  Entresto  49-51 mg 1 tablet twice daily 20.  Protonix  40 mg daily  30-35 minutes were spent completing discharge summary and discharge planning   Follow-up Information     Lylia Sand, MD Follow up.   Specialty: Physical Medicine and Rehabilitation Why: No formal follow-up needed Contact information: [address] [place] [address] [phone]         Orlie Bjornstad, MD Follow up.   Specialty: Infectious Diseases Why: Call for appointment Contact information: [address] [place] [address] [phone]         [name], MD Follow up.   Specialty: Cardiothoracic Surgery Why: Call for appointment Contact information: [address] [place] [address] [phone]         Eilleen Grates, MD Follow up.   Specialty: Cardiology Why: Call for appointment Contact information: [address] [place] [address] [phone]                 Signed: Sterling Eisenmenger 09/05/2023, 4:53 AM

## 2023-08-31 NOTE — Patient Instructions (Signed)
 Melissa Bray

## 2023-08-31 NOTE — IPOC Note (Signed)
 Overall Plan of Care [facility]) Patient Details Name: Melissa Bray MRN: [id number] DOB: 1960-09-17  Admitting Diagnosis: Debility  Hospital Problems: Principal Problem:   Debility     Functional Problem List: Nursing Edema, Endurance, Medication Management, Motor, Pain, Skin Integrity  PT Balance, Safety, Endurance  OT Endurance  SLP    TR         Basic ADL's: OT Dressing, Bathing     Advanced  ADL's: OT Simple Meal Preparation, Laundry     Transfers: PT Bed Mobility, Bed to Chair, Car  OT Tub/Shower (patient has shower/tub combined)     Locomotion: PT Ambulation, Stairs     Additional Impairments: OT    SLP        TR      Anticipated Outcomes Item Anticipated Outcome  Self Feeding ModI  Swallowing      Basic self-care  ModI  Toileting  ModI   Bathroom Transfers ModI  Bowel/Bladder  continent of bowel and bladder  Transfers  mod I with LRAD  Locomotion  mod I with LRAD  Communication     Cognition     Pain  <4 w/ prns  Safety/Judgment  manage safety with minimal assistance   Therapy Plan: PT Intensity: Minimum of 1-2 x/day ,45 to 90 minutes PT Frequency: 5 out of 7 days PT Duration Estimated Length of Stay: 7-10 days OT Intensity: Minimum of 1-2 x/day, 45 to 90 minutes OT Frequency: Total of 15 hours over 7 days of combined therapies OT Duration/Estimated Length of Stay: 7-10days     Team Interventions: Nursing Interventions Patient/Family Education, Skin Care/Wound Management, Bladder Management, Bowel Management, Disease Management/Prevention, Pain Management, Medication Management, Discharge Planning  PT interventions Ambulation/gait training, Community reintegration, DME/adaptive equipment instruction, Neuromuscular re-education, Psychosocial support, Stair training, UE/LE Strength taining/ROM, Wheelchair propulsion/positioning, UE/LE Coordination activities, Therapeutic Activities, Skin care/wound management, Pain management, Functional  electrical stimulation, Discharge planning, Balance/vestibular training, Cognitive remediation/compensation, Disease management/prevention, Functional mobility training, Patient/family education, Splinting/orthotics, Therapeutic Exercise, Visual/perceptual remediation/compensation  OT Interventions Therapeutic Exercise, UE/LE Coordination activities, Therapeutic Activities  SLP Interventions    TR Interventions    SW/CM Interventions Discharge Planning, Psychosocial Support, Patient/Family Education   Barriers to Discharge MD  Medical stability  Nursing Decreased caregiver support, Home environment access/layout Discharge: House  Discharge Home Layout: One level  Discharge Home Access: Stairs to enter  Entrance Stairs-Rails: None  Entrance Stairs-Number of Steps: 2  PT Home environment access/layout, Wound Care    OT      SLP      SW       Team Discharge Planning: Destination: PT-Home ,OT- Home , SLP-  Projected Follow-up: PT-Outpatient PT, OT-  None, SLP-  Projected Equipment Needs: PT-To be determined, OT- Tub/shower bench, 3 in 1 bedside comode, SLP-  Equipment Details: PT- , OT-Patient could possibly benefit from shower chair with back and extension secondary to shower/tub combined in the home. Patient/family involved in discharge planning: PT- Patient,  OT-Patient, SLP-   MD ELOS: Mod I  Medical Rehab Prognosis:  Excellent Assessment: The patient has been admitted for CIR therapies with the diagnosis of debility after bacterial endocarditis/status post mitral valve replacement 08/12/2023 with multiple associated complications . The team will be addressing functional mobility, strength, stamina, balance, safety, adaptive techniques and equipment, self-care, bowel and bladder mgt, patient and caregiver education. Goals have been set at mod I . Anticipated discharge destination is home.        See Team Conference Notes for weekly updates to  the plan of care

## 2023-08-31 NOTE — Discharge Instructions (Signed)
 Inpatient Rehab Discharge Instructions  Melissa Bray Discharge date and time: No discharge date for patient encounter.   Activities/Precautions/ Functional Status: Activity: Sternal precautions Diet: Diabetic diet Wound Care: Routine skin checks Functional status:  ___ No restrictions     ___ Walk up steps independently ___ 24/7 supervision/assistance   ___ Walk up steps with assistance ___ Intermittent supervision/assistance  ___ Bathe/dress independently ___ Walk with walker     __x_ Bathe/dress with assistance ___ Walk Independently    ___ Shower independently ___ Walk with assistance    ___ Shower with assistance ___ No alcohol     ___ Return to work/school ________  Special Instructions: Follow-up [place] [facility]/[place] [phone] fax [phone]   My questions have been answered and I understand these instructions. I will adhere to these goals and the provided educational materials after my discharge from the hospital.  Patient/Caregiver Signature _______________________________ Date __________  Clinician Signature _______________________________________ Date __________  Please bring this form and your medication list with you to all your follow-up doctor's appointments.

## 2023-08-31 NOTE — Progress Notes (Signed)
 Physical Therapy Session Note  Patient Details  Name: Melissa Bray MRN: [id number] Date of Birth: September 15, 1960  Today's Date: 08/31/2023 PT Individual Time: 1435-1500 PT Individual Time Calculation (min): 25 min   Short Term Goals: Week 1:  PT Short Term Goal 1 (Week 1): none d/t ELOS  Skilled Therapeutic Interventions/Progress Updates:  Pt was seen bedside in the pm. Pt performed all transfers with S to contact guard with verbal cues. Pt ambulated 285 feet, 3005 feet and 20 feet with rolling walker and S to contact guard. Pt left sitting up in recliner with all needs within reach.   Therapy Documentation Precautions:  Precautions Precautions: Sternal, Fall Precaution Booklet Issued: Yes (comment) Recall of Precautions/Restrictions: Intact Precaution/Restrictions Comments: good recall of precautions Restrictions Weight Bearing Restrictions Per Provider Order: Yes RUE Weight Bearing Per Provider Order: Non weight bearing LUE Weight Bearing Per Provider Order: Non weight bearing RLE Weight Bearing Per Provider Order: Weight bearing as tolerated LLE Weight Bearing Per Provider Order: Weight bearing as tolerated Other Position/Activity Restrictions: sternal General: PT Amount of Missed Time (min): 30 Minutes PT Missed Treatment Reason: Patient fatigue  Pain: No c/o pain.   Therapy/Group: Individual Therapy  Raymond Azure G 08/31/2023, 3:15 PM

## 2023-08-31 NOTE — Progress Notes (Signed)
 Occupational Therapy Session Note  Patient Details  Name: Melissa Bray MRN: [id number] Date of Birth: 23-Apr-1960  Today's Date: 08/31/2023 OT Individual Time: 0800-0900 OT Individual Time Calculation (min): 60 min    Short Term Goals: Week 1:  OT Short Term Goal 1 (Week 1): The pt will tolerate > 30 minutes of OT activity with 1 to 2 rest breaks. OT Short Term Goal 2 (Week 1): The pt will complete a tub transfer with  ModI using a shower chair with extension with good safety awareness. OT Short Term Goal 3 (Week 1): The pt will complete LB bathing/dressing with ModI and full adherence to sternal precautions. OT Short Term Goal 4 (Week 1): The pt will complete simple home making task at ModI  with full adherence to sternal precaution and good safety measures.  Skilled Therapeutic Interventions/Progress Updates:    Patient in the recliner watching television at the time of arrival.  Patient verbalize being a little constipated and expressed a need for a stool softener. The pt reported a pain response of a 5 on 0-10 for chest pain and went on to say that she was able to rest during the night.  The pt was in agreement with completing a BADL related task in preparation for her day.  Patient presents with resting BP of 146/87, O2 saturation of 100% on room air.  The pt had Pulse of 91.NT came in to check line and to complete a draw. NT administered morning med as well. The pt was able to come from sit to stand with CGA using the RW for ambulating to the restroom.  The pt was able to go from standing to sitting with attention to sternal precaution while placing hand on grab bar  while going in to sit onto the commode. The pt was able to complete toileting hygiene with close S. The pt was able to come from sit to stand  with attention to sternal precautions with BUE across her chest coming in to stand.  The pt was able to ambulate to the sink area using the RW for additional balance.  The pt went from  standing to sitting at sink LOF with CGA for brushing her teeth with s/uA.  The pt was able to wash her face and brush her teeth  with s/uA.  The pt was MinA for changing her gown and she was able to ambulate to the recliner with CGA using the RW.  The pt was Dep for the donning her ted hose.  At the end of the session, the call light and bedside table were placed within reach with all additional needs addressed.  Therapy Documentation Precautions:  Precautions Precautions: Sternal, Fall Precaution Booklet Issued: Yes (comment) Recall of Precautions/Restrictions: Intact Precaution/Restrictions Comments: good recall of precautions Restrictions Weight Bearing Restrictions Per Provider Order: Yes RUE Weight Bearing Per Provider Order: Non weight bearing LUE Weight Bearing Per Provider Order: Non weight bearing RLE Weight Bearing Per Provider Order: Weight bearing as tolerated LLE Weight Bearing Per Provider Order: Weight bearing as tolerated Other Position/Activity Restrictions: sternal General:   Vital Signs: Therapy Vitals Temp: 98.4 F (36.9 C) Temp Source: Oral Pulse Rate: 87 Resp: 17 BP: 134/65 Patient Position (if appropriate): Lying Oxygen  Therapy SpO2: 99 % O2 Device: Room Air Pain:   ADL: ADL Equipment Provided: Other (comment) (RW) Eating: Set up Where Assessed-Eating: Chair, Bed level, Edge of bed (based on observation at the time of eval) Grooming: Setup Where Assessed-Grooming: Other (comment) (based on  observaiton during functional task performance.) Upper Body Bathing: Setup (patient was able to complete a simulated task in bathing EOB) Where Assessed-Upper Body Bathing: Edge of bed Lower Body Bathing: Setup Where Assessed-Lower Body Bathing: Edge of bed Upper Body Dressing: Setup Where Assessed-Upper Body Dressing: Edge of bed Lower Body Dressing: Setup Where Assessed-Lower Body Dressing: Edge of bed Toileting: Setup, Modified independent (using RW and grab  bars.) Toilet Transfer: Licensed conveyancer: Other (comment) (arms across chest for sternal precautions able ot come from sit to stand using momentum) Tub/Shower Transfer: Scientific laboratory technician Method: Ambulating (using RW) Tub/Shower Equipment: Information systems manager with back Film/video editor: Not assessed (would benefit from shower chair with extension.) Film/video editor Method: Designer, industrial/product: Shower seat with back (CGA for performance based on functional staus)   Other Treatments:     Therapy/Group: Individual Therapy  Moises Ang 08/31/2023, 9:34 AM

## 2023-08-31 NOTE — Plan of Care (Signed)
  Problem: Consults Goal: RH GENERAL PATIENT EDUCATION Description: See Patient Education module for education specifics. Outcome: Progressing   Problem: RH BOWEL ELIMINATION Goal: RH STG MANAGE BOWEL WITH ASSISTANCE Description: STG Manage Bowel with min Assistance. Outcome: Progressing   Problem: RH BLADDER ELIMINATION Goal: RH STG MANAGE BLADDER WITH ASSISTANCE Description: STG Manage Bladder With min Assistance Outcome: Progressing   Problem: RH SAFETY Goal: RH STG ADHERE TO SAFETY PRECAUTIONS W/ASSISTANCE/DEVICE Description: STG Adhere to Safety Precautions With min Assistance/Device. Outcome: Progressing   Problem: RH PAIN MANAGEMENT Goal: RH STG PAIN MANAGED AT OR BELOW PT'S PAIN GOAL Description: <4 w/ prns Outcome: Progressing

## 2023-08-31 NOTE — Progress Notes (Signed)
 Physical Therapy Session Note  Patient Details  Name: Melissa Bray MRN: [id number] Date of Birth: January 06, 1961  Today's Date: 08/31/2023 PT Individual Time: 1300-1320 PT Individual Time Calculation (min): 20 min   Short Term Goals: Week 1:  PT Short Term Goal 1 (Week 1): none d/t ELOS  Skilled Therapeutic Interventions/Progress Updates:  Pt was seen bedside in the pm. Pt performed all sit to stand transfers with S to contact guard depending of surface height. Pt ambulated 135 and 223 feet with rolling walker and S to contact guard. Pt returned to room and left sitting up in chair with family at bedside.   Therapy Documentation Precautions:  Precautions Precautions: Sternal, Fall Precaution Booklet Issued: Yes (comment) Recall of Precautions/Restrictions: Intact Precaution/Restrictions Comments: good recall of precautions Restrictions Weight Bearing Restrictions Per Provider Order: Yes RUE Weight Bearing Per Provider Order: Non weight bearing LUE Weight Bearing Per Provider Order: Non weight bearing RLE Weight Bearing Per Provider Order: Weight bearing as tolerated LLE Weight Bearing Per Provider Order: Weight bearing as tolerated Other Position/Activity Restrictions: sternal General:   Vital Signs:   Pain: No c/o pain.   Therapy/Group: Individual Therapy  Williard Keller G 08/31/2023, 3:12 PM

## 2023-08-31 NOTE — Progress Notes (Signed)
 PROGRESS NOTE   Subjective/Complaints:  No events overnight.  No acute complaints.  Doing well, watching soap opera's today. Vitals stable BG improved  ROS: Denies fevers, chills, N/V, abdominal pain, constipation, diarrhea, SOB, cough, chest pain, new weakness or paraesthesias.    Objective:   No results found. Recent Labs    08/29/23 0400  WBC 6.4  HGB 9.3*  HCT 29.3*  PLT 404*   Recent Labs    08/28/23 1253 08/29/23 0400  NA 138 138  K 4.1 3.9  CL 104 105  CO2 22 26  GLUCOSE 221* 90  BUN 12 11  CREATININE 1.11* 0.93  CALCIUM  9.5 8.9    Intake/Output Summary (Last 24 hours) at 08/31/2023 0910 Last data filed at 08/30/2023 2018 Gross per 24 hour  Intake 880.12 ml  Output --  Net 880.12 ml     Pressure Injury 08/27/23 Buttocks Other (Comment);Right Stage 2 -  Partial thickness loss of dermis presenting as a shallow open injury with a red, pink wound bed without slough. Small pink are stage 2 right outer buttocks (Active)  08/27/23 0010  Location: Buttocks  Location Orientation: Other (Comment);Right (outer)  Staging: Stage 2 -  Partial thickness loss of dermis presenting as a shallow open injury with a red, pink wound bed without slough.  Wound Description (Comments): Small pink are stage 2 right outer buttocks  Present on Admission:     Physical Exam: Vital Signs Blood pressure 134/65, pulse 87, temperature 98.4 F (36.9 C), temperature source Oral, resp. rate 17, height 5\' 5"  (1.651 m), weight 116.1 kg, SpO2 99%.  Physical Exam Constitutional:      General: She is not in acute distress.  Laying in bed     Appearance: She is obese. She is not ill-appearing.  HENT:     Head: Normocephalic and atraumatic.     Nose: Nose normal.     Mouth/Throat:     Mouth: Mucous membranes are moist.  Eyes:     Extraocular Movements: Extraocular movements intact.     Conjunctiva/sclera: Conjunctivae normal.      Pupils: Pupils are equal, round, and reactive to light.  Cardiovascular:     Rate and Rhythm: Normal rate and regular rhythm.     Heart sounds: Murmur heard.  Pulmonary:     Effort: Pulmonary effort is normal. No respiratory distress.     Breath sounds: No wheezing, rhonchi or rales.  Abdominal:     General: Bowel sounds are normal. There is no distension.     Palpations: Abdomen is soft.     Tenderness: There is no abdominal tenderness.  Musculoskeletal:     Cervical back: Normal range of motion.     Right lower leg: Edema present.     Left lower leg: Edema present.  Skin:    General: Skin is warm and dry.     Comments: Old left mastectomy scar --well-healed. Neurological:     Comments: Alert and oriented x 3. Normal insight and awareness. Intact Memory. Normal language and speech. Cranial nerve exam unremarkable. MMT: BUE motor 4+ to 5/5 prox to distal. BLE: 4 HF, KE and 4+ ADF/PF. Did not appreciate any  focal sensory loss or abnl tone. DTR's 1+. No ataxia.    Psychiatric:        Mood and Affect: Mood normal.        Behavior: Behavior normal.   Physical exam unchanged from the above on reexamination 08/31/23    Assessment/Plan: 1. Functional deficits which require 3+ hours per day of interdisciplinary therapy in a comprehensive inpatient rehab setting. Physiatrist is providing close team supervision and 24 hour management of active medical problems listed below. Physiatrist and rehab team continue to assess barriers to discharge/monitor patient progress toward functional and medical goals  Care Tool:  Bathing    Body parts bathed by patient: Right arm, Left arm, Chest, Abdomen, Front perineal area, Buttocks, Face, Left upper leg, Right upper leg, Left lower leg, Right lower leg     Body parts n/a: Right lower leg, Left lower leg   Bathing assist Assist Level: Minimal Assistance - Patient > 75%     Upper Body Dressing/Undressing Upper body dressing   What is the patient  wearing?: Hospital gown only    Upper body assist Assist Level: Minimal Assistance - Patient > 75% (s/u to MinA with fasteners)    Lower Body Dressing/Undressing Lower body dressing      What is the patient wearing?: Hospital gown only     Lower body assist Assist for lower body dressing: Minimal Assistance - Patient > 75% (Patient was able to simulate LB dressing using theraband with MinA)     Toileting Toileting    Toileting assist Assist for toileting: Set up assist     Transfers Chair/bed transfer  Transfers assist     Chair/bed transfer assist level: Contact Guard/Touching assist     Locomotion Ambulation   Ambulation assist      Assist level: Contact Guard/Touching assist Assistive device: Walker-rolling Max distance: 407   Walk 10 feet activity   Assist     Assist level: Contact Guard/Touching assist Assistive device: Walker-rolling   Walk 50 feet activity   Assist    Assist level: Contact Guard/Touching assist Assistive device: Walker-rolling    Walk 150 feet activity   Assist    Assist level: Contact Guard/Touching assist Assistive device: Walker-rolling    Walk 10 feet on uneven surface  activity   Assist     Assist level: Contact Guard/Touching assist Assistive device: Walker-rolling   Wheelchair     Assist Is the patient using a wheelchair?: No             Wheelchair 50 feet with 2 turns activity    Assist            Wheelchair 150 feet activity     Assist          Blood pressure 134/65, pulse 87, temperature 98.4 F (36.9 C), temperature source Oral, resp. rate 17, height 5\' 5"  (1.651 m), weight 116.1 kg, SpO2 99%.  Medical Problem List and Plan: 1. Functional deficits secondary to debility after bacterial endocarditis/status post mitral valve replacement 08/12/2023 with multiple associated complications.   -Sternal precautions             -patient may shower             -ELOS/Goals:  7-10 days, mod I to supervision goals.  2.  Antithrombotics: -DVT/anticoagulation:  Pharmaceutical: Coumadin .  INR range 2.5-3.5 -monitor labs daily, pharmacy protocol             -antiplatelet therapy: N/A 3. Pain Management: Oxycodone  as  needed, tramadol  4. Mood/Behavior/Sleep: Klonopin  0.5 mg twice daily as needed anxiety, melatonin 5 mg nightly as needed             -antipsychotic agents: N/A 5. Neuropsych/cognition: This patient is capable of making decisions on her own behalf. 6. Skin/Wound Care: Routine skin checks 7. Fluids/Electrolytes/Nutrition: Routine in and outs with follow-up chemistries 8.  Acute blood loss anemia.  Follow-up CBC 9.  ID/bacterial endocarditis.  Penicillin  G initiated 08/13/2023.  Continue through 09/24/2023.   - weekly labs for monitoring - Follow-up with infectious disease as outpt --[facility] 09/29/23 @0930  -Admission labs stable.  10.  Hypertension.  Coreg  3.125 mg twice daily, Entresto  24-26 mg twice daily, Aldactone  12.5 mg daily, Demadex  20 mg daily, Farxiga  10 mg daily  - Normotensive, monitor on current regimen    08/31/2023    6:08 AM 08/30/2023    8:32 PM 08/30/2023    7:49 PM  Vitals with BMI  Weight 255 lbs 15 oz    BMI 42.59    Systolic 134 118 604  Diastolic 65 68 47  Pulse 87 89 89    11.  Atrial flutter with RVR.  Status post DCCV 5/23.  Maintaining normal sinus rhythm.  Amiodarone  200 mg daily             -rate well controlled at present 12.  Diabetes mellitus.  Hemoglobin A1c 12.8.  Currently on NovoLog  3 units 3 times daily with meals, Semglee  25 units twice daily.  Prior to admission patient on Glucotrol  XL 10 mg daily, Lantus  insulin  20 units daily.   -reasonable control at present -Resume as needed -5-31: Tight control last night and this a.m., reduce Semglee  to 22 units twice daily--improved Recent Labs    08/30/23 1748 08/30/23 2034 08/31/23 0615  GLUCAP 106* 181* 121*     13.  History of breast cancer status post left  mastectomy 2016.  Continue Arimidex  and follow-up outpatient 14.  Class III morbid obesity.  BMI 42.67.  Dietary follow-up    LOS: 2 days A FACE TO FACE EVALUATION WAS PERFORMED  Bea Lime 08/31/2023, 9:10 AM

## 2023-09-01 DIAGNOSIS — R5381 Other malaise: Secondary | ICD-10-CM | POA: Diagnosis not present

## 2023-09-01 DIAGNOSIS — I330 Acute and subacute infective endocarditis: Secondary | ICD-10-CM | POA: Diagnosis not present

## 2023-09-01 DIAGNOSIS — I10 Essential (primary) hypertension: Secondary | ICD-10-CM | POA: Diagnosis not present

## 2023-09-01 DIAGNOSIS — E119 Type 2 diabetes mellitus without complications: Secondary | ICD-10-CM | POA: Diagnosis not present

## 2023-09-01 LAB — CBC WITH DIFFERENTIAL/PLATELET
Abs Immature Granulocytes: 0.02 10*3/uL (ref 0.00–0.07)
Basophils Absolute: 0 10*3/uL (ref 0.0–0.1)
Basophils Relative: 1 %
Eosinophils Absolute: 0.1 10*3/uL (ref 0.0–0.5)
Eosinophils Relative: 3 %
HCT: 28.8 % — ABNORMAL LOW (ref 36.0–46.0)
Hemoglobin: 9.1 g/dL — ABNORMAL LOW (ref 12.0–15.0)
Immature Granulocytes: 0 %
Lymphocytes Relative: 29 %
Lymphs Abs: 1.4 10*3/uL (ref 0.7–4.0)
MCH: 28.9 pg (ref 26.0–34.0)
MCHC: 31.6 g/dL (ref 30.0–36.0)
MCV: 91.4 fL (ref 80.0–100.0)
Monocytes Absolute: 0.7 10*3/uL (ref 0.1–1.0)
Monocytes Relative: 14 %
Neutro Abs: 2.6 10*3/uL (ref 1.7–7.7)
Neutrophils Relative %: 53 %
Platelets: 290 10*3/uL (ref 150–400)
RBC: 3.15 MIL/uL — ABNORMAL LOW (ref 3.87–5.11)
RDW: 14.3 % (ref 11.5–15.5)
WBC: 4.9 10*3/uL (ref 4.0–10.5)
nRBC: 0 % (ref 0.0–0.2)

## 2023-09-01 LAB — COMPREHENSIVE METABOLIC PANEL WITH GFR
ALT: 10 U/L (ref 0–44)
AST: 13 U/L — ABNORMAL LOW (ref 15–41)
Albumin: 2.5 g/dL — ABNORMAL LOW (ref 3.5–5.0)
Alkaline Phosphatase: 58 U/L (ref 38–126)
Anion gap: 6 (ref 5–15)
BUN: 12 mg/dL (ref 8–23)
CO2: 26 mmol/L (ref 22–32)
Calcium: 8.7 mg/dL — ABNORMAL LOW (ref 8.9–10.3)
Chloride: 104 mmol/L (ref 98–111)
Creatinine, Ser: 1.02 mg/dL — ABNORMAL HIGH (ref 0.44–1.00)
GFR, Estimated: >60 mL/min (ref >60)
Glucose, Bld: 74 mg/dL (ref 70–99)
Potassium: 3.9 mmol/L (ref 3.5–5.1)
Sodium: 136 mmol/L (ref 135–145)
Total Bilirubin: 0.5 mg/dL (ref 0.0–1.2)
Total Protein: 6.7 g/dL (ref 6.5–8.1)

## 2023-09-01 LAB — PROTIME-INR
INR: 2.8 — ABNORMAL HIGH (ref 0.8–1.2)
Prothrombin Time: 29.3 s — ABNORMAL HIGH (ref 11.4–15.2)

## 2023-09-01 LAB — GLUCOSE, CAPILLARY
Glucose-Capillary: 83 mg/dL (ref 70–99)
Glucose-Capillary: 205 mg/dL — ABNORMAL HIGH (ref 70–99)
Glucose-Capillary: 107 mg/dL — ABNORMAL HIGH (ref 70–99)
Glucose-Capillary: 160 mg/dL — ABNORMAL HIGH (ref 70–99)

## 2023-09-01 MED ORDER — INSULIN GLARGINE-YFGN 100 UNIT/ML ~~LOC~~ SOLN
20.0000 [IU] | Freq: Two times a day (BID) | SUBCUTANEOUS | Status: DC
  Administered 2023-09-01 – 2023-09-05 (×8): 20 [IU] via SUBCUTANEOUS
  Filled 2023-09-01 (×9): qty 0.2

## 2023-09-01 MED ORDER — SACUBITRIL-VALSARTAN 49-51 MG PO TABS
1.0000 | ORAL_TABLET | Freq: Two times a day (BID) | ORAL | Status: DC
  Administered 2023-09-01 – 2023-09-05 (×9): 1 via ORAL
  Filled 2023-09-01 (×9): qty 1

## 2023-09-01 MED ORDER — WARFARIN SODIUM 6 MG PO TABS
6.0000 mg | ORAL_TABLET | Freq: Once | ORAL | Status: AC
  Administered 2023-09-01: 6 mg via ORAL
  Filled 2023-09-01: qty 1

## 2023-09-01 MED ORDER — WARFARIN SODIUM 5 MG PO TABS: 5.0000 mg | ORAL_TABLET | Freq: Once | ORAL | Status: DC

## 2023-09-01 MED ORDER — SODIUM CHLORIDE 0.9% FLUSH: 10.0000 mL | INTRAVENOUS | Status: DC | PRN

## 2023-09-01 MED ORDER — SODIUM CHLORIDE 0.9% FLUSH
10.0000 mL | Freq: Two times a day (BID) | INTRAVENOUS | Status: DC
  Administered 2023-09-01 – 2023-09-03 (×3): 10 mL

## 2023-09-01 MED ORDER — GERHARDT'S BUTT CREAM
TOPICAL_CREAM | Freq: Every day | CUTANEOUS | Status: DC
  Filled 2023-09-01: qty 60

## 2023-09-01 NOTE — Progress Notes (Signed)
 PROGRESS NOTE   Subjective/Complaints:  Pt up in chair. No issues overnight. Denies pain. Ready for therapies today  ROS: Patient denies fever, rash, sore throat, blurred vision, dizziness, nausea, vomiting, diarrhea, cough, shortness of breath or chest pain, joint or back/neck pain, headache, or mood change.   Objective:   No results found. Recent Labs    09/01/23 0550  WBC 4.9  HGB 9.1*  HCT 28.8*  PLT 290   Recent Labs    09/01/23 0550  NA 136  K 3.9  CL 104  CO2 26  GLUCOSE 74  BUN 12  CREATININE 1.02*  CALCIUM  8.7*    Intake/Output Summary (Last 24 hours) at 09/01/2023 0940 Last data filed at 09/01/2023 1610 Gross per 24 hour  Intake 2174.34 ml  Output --  Net 2174.34 ml     Pressure Injury 08/27/23 Buttocks Other (Comment);Right Stage 2 -  Partial thickness loss of dermis presenting as a shallow open injury with a red, pink wound bed without slough. Small pink are stage 2 right outer buttocks (Active)  08/27/23 0010  Location: Buttocks  Location Orientation: Other (Comment);Right (outer)  Staging: Stage 2 -  Partial thickness loss of dermis presenting as a shallow open injury with a red, pink wound bed without slough.  Wound Description (Comments): Small pink are stage 2 right outer buttocks  Present on Admission:     Physical Exam: Vital Signs Blood pressure (!) 142/79, pulse 86, temperature 98.4 F (36.9 C), temperature source Oral, resp. rate 17, height 5\' 5"  (1.651 m), weight 116 kg, SpO2 100%.  Physical Exam Constitutional: No distress . Vital signs reviewed. obese HEENT: NCAT, EOMI, oral membranes moist Neck: supple Cardiovascular: RRR without murmur. No JVD    Respiratory/Chest: CTA Bilaterally without wheezes or rales. Normal effort    GI/Abdomen: BS +, non-tender, non-distended Ext: no clubbing, cyanosis, or edema Psych: pleasant and cooperative  Musculoskeletal:     Cervical back:  Normal range of motion.     Right lower leg: Edema present.     Left lower leg: Edema present.  Skin:    General: Skin is warm and dry.     Comments: Old left mastectomy scar present Neurological:     Comments: Alert and oriented x 3. Normal insight and awareness. Intact Memory. Normal language and speech. Cranial nerve exam unremarkable. MMT: BUE motor 4+ to 5/5 prox to distal. BLE: 4 HF, KE and 4+ ADF/PF. Did not appreciate any focal sensory loss or abnl tone. DTR's 1+. No ataxia.    Physical exam unchanged from the above on reexamination 09/01/23    Assessment/Plan: 1. Functional deficits which require 3+ hours per day of interdisciplinary therapy in a comprehensive inpatient rehab setting. Physiatrist is providing close team supervision and 24 hour management of active medical problems listed below. Physiatrist and rehab team continue to assess barriers to discharge/monitor patient progress toward functional and medical goals  Care Tool:  Bathing    Body parts bathed by patient: Right arm, Left arm, Chest, Abdomen, Front perineal area, Buttocks, Face, Left upper leg, Right upper leg, Left lower leg, Right lower leg     Body parts n/a: Right lower leg,  Left lower leg   Bathing assist Assist Level: Minimal Assistance - Patient > 75%     Upper Body Dressing/Undressing Upper body dressing   What is the patient wearing?: Hospital gown only    Upper body assist Assist Level: Minimal Assistance - Patient > 75% (s/u to MinA with fasteners)    Lower Body Dressing/Undressing Lower body dressing      What is the patient wearing?: Hospital gown only     Lower body assist Assist for lower body dressing: Minimal Assistance - Patient > 75% (Patient was able to simulate LB dressing using theraband with MinA)     Toileting Toileting    Toileting assist Assist for toileting: Set up assist     Transfers Chair/bed transfer  Transfers assist     Chair/bed transfer assist level:  Contact Guard/Touching assist     Locomotion Ambulation   Ambulation assist      Assist level: Contact Guard/Touching assist Assistive device: Walker-rolling Max distance: 223   Walk 10 feet activity   Assist     Assist level: Contact Guard/Touching assist Assistive device: Walker-rolling   Walk 50 feet activity   Assist    Assist level: Contact Guard/Touching assist Assistive device: Walker-rolling    Walk 150 feet activity   Assist    Assist level: Contact Guard/Touching assist Assistive device: Walker-rolling    Walk 10 feet on uneven surface  activity   Assist     Assist level: Contact Guard/Touching assist Assistive device: Walker-rolling   Wheelchair     Assist Is the patient using a wheelchair?: No             Wheelchair 50 feet with 2 turns activity    Assist            Wheelchair 150 feet activity     Assist          Blood pressure (!) 142/79, pulse 86, temperature 98.4 F (36.9 C), temperature source Oral, resp. rate 17, height 5\' 5"  (1.651 m), weight 116 kg, SpO2 100%.  Medical Problem List and Plan: 1. Functional deficits secondary to debility after bacterial endocarditis/status post mitral valve replacement 08/12/2023 with multiple associated complications.   -Sternal precautions             -patient may shower             -ELOS/Goals: 7-10 days, mod I to supervision goals.   -Continue CIR therapies including PT, OT  2.  Antithrombotics: -DVT/anticoagulation:  Pharmaceutical: Coumadin .  INR range 2.5-3.5 -monitor labs daily, pharmacy protocol             -antiplatelet therapy: N/A 3. Pain Management: Oxycodone  as needed, tramadol  4. Mood/Behavior/Sleep: Klonopin  0.5 mg twice daily as needed anxiety, melatonin 5 mg nightly as needed             -antipsychotic agents: N/A 5. Neuropsych/cognition: This patient is capable of making decisions on her own behalf. 6. Skin/Wound Care: Routine skin checks 7.  Fluids/Electrolytes/Nutrition: Routine in and outs with follow-up chemistries 8.  Acute blood loss anemia.  Follow-up CBC 9.  ID/bacterial endocarditis.  Penicillin  G initiated 08/13/2023.  Continue through 09/24/2023.   - weekly labs for monitoring - Follow-up with infectious disease as outpt --[facility] 09/29/23 @0930  -6/2 today's labs reviewed and stable  10.  Hypertension.  Coreg  3.125 mg twice daily, Entresto  24-26 mg twice daily, Aldactone  12.5 mg daily, Demadex  20 mg daily, Farxiga  10 mg daily  - 6/2 Normotensive, monitor  on current regimen    09/01/2023    5:00 AM 09/01/2023    3:50 AM 08/31/2023    6:07 PM  Vitals with BMI  Weight 255 lbs 12 oz    BMI 42.56    Systolic  142   Diastolic  79   Pulse  86 94    11.  Atrial flutter with RVR.  Status post DCCV 5/23.  Maintaining normal sinus rhythm.  Amiodarone  200 mg daily             -rate remains well controlled at present 12.  Diabetes mellitus.  Hemoglobin A1c 12.8.  Currently on NovoLog  3 units 3 times daily with meals, Semglee  25 units twice daily.  Prior to admission patient on Glucotrol  XL 10 mg daily, Lantus  insulin  20 units daily.   -reasonable control at present -Resume as needed -5-31: Tight control last night and this a.m., reduce Semglee  to 22 units twice daily--improved Recent Labs    08/31/23 1710 08/31/23 2203 09/01/23 0606  GLUCAP 71 129* 83    6/2 still a low reading or two. Will reduce semglee  further to 20u bid  13.  History of breast cancer status post left mastectomy 2016.  Continue Arimidex  and follow-up outpatient 14.  Class III morbid obesity.  BMI 42.67.  Dietary follow-up    LOS: 3 days A FACE TO FACE EVALUATION WAS PERFORMED  Rawland Caddy 09/01/2023, 9:40 AM

## 2023-09-01 NOTE — Progress Notes (Signed)
 PHARMACY - ANTICOAGULATION CONSULT NOTE  Pharmacy Consult for warfarin Indication: onX mMVR  No Known Allergies  Patient Measurements: Height: 5\' 5"  (165.1 cm) Weight: 116 kg (255 lb 11.7 oz) IBW/kg (Calculated) : 57 HEPARIN  DW (KG): 84.4  Vital Signs: Temp: 98.4 F (36.9 C) (06/02 0350) Temp Source: Oral (06/02 0350) BP: 142/79 (06/02 0350) Pulse Rate: 86 (06/02 0350)  Labs: Recent Labs    08/30/23 0820 08/31/23 0805 09/01/23 0550  HGB  --   --  9.1*  HCT  --   --  28.8*  PLT  --   --  290  LABPROT 34.0* 30.3* 29.3*  INR 3.3* 2.9* 2.8*  CREATININE  --   --  1.02*    Estimated Creatinine Clearance: 70.9 mL/min (A) (by C-G formula based on SCr of 1.02 mg/dL (H)).   Medical History: Past Medical History:  Diagnosis Date   Anxiety    Breast cancer (HCC)    Diabetes mellitus without complication (HCC)    Hyperlipidemia    Hypertension    Type 2 diabetes mellitus without retinopathy (HCC) 06/17/2017    Medications:  Scheduled:   amiodarone   200 mg Oral Daily   anastrozole   1 mg Oral Daily   carvedilol   3.125 mg Oral BID WC   Chlorhexidine  Gluconate Cloth  6 each Topical Q12H   dapagliflozin  propanediol  10 mg Oral Daily   feeding supplement  237 mL Oral BID BM   insulin  aspart  0-15 Units Subcutaneous TID WC   insulin  aspart  3 Units Subcutaneous TID WC   insulin  glargine-yfgn  22 Units Subcutaneous BID   pantoprazole   40 mg Oral Daily   spironolactone   12.5 mg Oral Daily   torsemide   20 mg Oral Daily   Warfarin - Pharmacist Dosing Inpatient   Does not apply q1600    Assessment: 63 yo F presenting with acute mitral valve endocarditis with perforated mitral leaflet and severe MR. Underwent dental extraction 5/8. Presented for pre-op RHC and developed flash pulmonary edema requiring intubation - IABP was placed in lab and IV heparin  started. No AC PTA.  Pt now s/p cardiac surgery with onX mMVR placed. Pharmacy consulted to start warfarin 5/16 after IABP  removal 5/15. S/p DCCV 5/23 and maintaining NSR. Heparin  bridge dc'd once INR > 2. Amiodarone  started on 5/11  Patient has transferred to inpatient rehab.  Pharmacy continuing with warfarin dosing.  Warfarin doses have been variable with an elevated INR and PRBCs x 1 on 5/22.  INR remains therapeutic today at 2.8, CBC stable.  Will attempt to establish stable regimen for discharge.  Goal of Therapy:  INR 2.5-3.5 Monitor platelets by anticoagulation protocol: Yes   Plan:  Warfarin 6 mg PO x 1 Monitor daily INR, CBC, clinical course, s/sx of bleed, PO intake/diet, Drug-Drug Interactions   Thank you for allowing pharmacy to be a part of this patient's care.   Victoria Grass, PharmD 09/01/2023 8:37 AM  **Pharmacist phone directory can be found on [email or web address] listed under [facility]**

## 2023-09-01 NOTE — Progress Notes (Signed)
 Inpatient Rehabilitation Center Individual Statement of Services  Patient Name:  Melissa Bray  Date:  09/01/2023  Welcome to the Inpatient Rehabilitation Center.  Our goal is to provide you with an individualized program based on your diagnosis and situation, designed to meet your specific needs.  With this comprehensive rehabilitation program, you will be expected to participate in at least 3 hours of rehabilitation therapies Monday-Friday, with modified therapy programming on the weekends.  Your rehabilitation program will include the following services:  Physical Therapy (PT), Occupational Therapy (OT), 24 hour per day rehabilitation nursing, Neuropsychology, Care Coordinator, Rehabilitation Medicine, Nutrition Services, and Pharmacy Services  Weekly team conferences will be held on Wednesday to discuss your progress.  Your Inpatient Rehabilitation Care Coordinator will talk with you frequently to get your input and to update you on team discussions.  Team conferences with you and your family in attendance may also be held.  Expected length of stay: 7-10 days  Overall anticipated outcome: independent with device  Depending on your progress and recovery, your program may change. Your Inpatient Rehabilitation Care Coordinator will coordinate services and will keep you informed of any changes. Your Inpatient Rehabilitation Care Coordinator's name and contact numbers are listed  below.  The following services may also be recommended but are not provided by the Inpatient Rehabilitation Center:  Driving Evaluations Home Health Rehabiltiation Services Outpatient Rehabilitation Services Vocational Rehabilitation   Arrangements will be made to provide these services after discharge if needed.  Arrangements include referral to agencies that provide these services.  Your insurance has been verified to be:  BCBS Your primary doctor is:  Kathrene Parents  Pertinent information will be shared with your  doctor and your insurance company.  Inpatient Rehabilitation Care Coordinator:  Adrianna Albee, [place] [phone] or (Justine Oms  Information discussed with and copy given to patient by: Mardell Shade, 09/01/2023, 10:09 AM

## 2023-09-01 NOTE — Progress Notes (Addendum)
 Occupational Therapy Session Note  Patient Details  Name: Melissa Bray MRN: [id number] Date of Birth: 01-May-1960  Today's Date: 09/01/2023 OT Individual Time: 1417-1530 OT Individual Time Calculation (min): 73 min    Short Term Goals: Week 1:  OT Short Term Goal 1 (Week 1): The pt will tolerate > 30 minutes of OT activity with 1 to 2 rest breaks. OT Short Term Goal 2 (Week 1): The pt will complete a tub transfer with  ModI using a shower chair with extension with good safety awareness. OT Short Term Goal 3 (Week 1): The pt will complete LB bathing/dressing with ModI and full adherence to sternal precautions. OT Short Term Goal 4 (Week 1): The pt will complete simple home making task at ModI  with full adherence to sternal precaution and good safety measures.  Skilled Therapeutic Interventions/Progress Updates:      Therapy Documentation Precautions:  Precautions Precautions: Sternal, Fall Precaution Booklet Issued: Yes (comment) Recall of Precautions/Restrictions: Intact Precaution/Restrictions Comments: good recall of precautions Restrictions Weight Bearing Restrictions Per Provider Order: Yes RUE Weight Bearing Per Provider Order: Non weight bearing LUE Weight Bearing Per Provider Order: Non weight bearing RLE Weight Bearing Per Provider Order: Weight bearing as tolerated LLE Weight Bearing Per Provider Order: Weight bearing as tolerated Other Position/Activity Restrictions: sternal General: "Hi there!" Pt seated in recliner upon OT arrival, agreeable to OT. Pt able to complete functional mobility with rollator at SBA from therapy gym><room. Pt also completing toileting at SBA after session, voiding urine and able to complete hygiene.   Pain: no pain reported  Exercises: Pt completed the following exercise circuit in order to improve functional activity, strength and endurance to prepare for ADLs such as bathing. Pt completed the following exercises in seated/standing position  with no noted LOB/SOB and 3x10 repetitions on each exercise: -bicep curls -triceps extensions -sit to stands -standing marches   Other Treatments: OT providing information for energy conservation describing what it is, purpose of conserving energy and specific examples of how to conserve energy during ADL and IADL tasks. Pt appreciative for information with good carryover. OT educating pt on "move in the tube" for sternal precautions during ADLs and what is safe/unsafe movements. OT educating pt on OPOT/PT vs home health OT/PT pros and cons. OT also educating pt on equipment insurance will cover. OT recommended TTB for bathing once home.    Pt seated in recliner at end of session with W/C alarm donned, call light within reach and 4Ps assessed.    Therapy/Group: Individual Therapy  Nila Barth, OTD, OTR/L 09/01/2023, 3:52 PM

## 2023-09-01 NOTE — Progress Notes (Signed)
 Met with patient to review current situation, team conference and plan of care. Reviewed daily weights, wound care, sternal precautions, diabetes education, diet, picc line and pen-g antibiotic initiated 5/14-6/25. Continue to follow along to provide educational needs to facilitate preparation for discharge.

## 2023-09-01 NOTE — Progress Notes (Signed)
 Occupational Therapy Session Note  Patient Details  Name: Keora Eccleston MRN: [id number] Date of Birth: 1960-06-09  Today's Date: 09/01/2023 OT Individual Time: 4098-1191 OT Individual Time Calculation (min): 75 min    Short Term Goals: Week 1:  OT Short Term Goal 1 (Week 1): The pt will tolerate > 30 minutes of OT activity with 1 to 2 rest breaks. OT Short Term Goal 2 (Week 1): The pt will complete a tub transfer with  ModI using a shower chair with extension with good safety awareness. OT Short Term Goal 3 (Week 1): The pt will complete LB bathing/dressing with ModI and full adherence to sternal precautions. OT Short Term Goal 4 (Week 1): The pt will complete simple home making task at ModI  with full adherence to sternal precaution and good safety measures.  Skilled Therapeutic Interventions/Progress Updates:    Pt received in recliner ready for therapy.  Pt connected to a continuous IV so unable to have it disconnected for a shower and to don shirt.  Pt did need to toilet. She stood from low recliner with S and then used RW with S to toilet.  Pt is ambulating so well she may be able to try without the RW, but pt stated she feels she needs it for support.   Pt voided small bowel but unable to fully cleanse her bottom and adhere to sternal precautions.  Discussed trial with toilet tongs in another session. Pt ambulated to wc at sink (retrieved a wc cushion from storage closet).  Pt bathed UB standing at sink demonstrating improved endurance.  Sat to wash lower legs with long sponge.  Assist for buttocks.  Pt donned mesh underwear looping over feet with min A.  Underwear was too tight so she will ask son to bring her some.    Had pt walk to toilet a second time to trial sit to stands without elevated toilet seat with CGA to stand.  Adjusted raised seat lower as it was so high her feet dangling over floor.  Supervision to stand from elevated seat.   Pt has lymphedema of LUE.  Pt stated she was  sized for a compression sleeve at a local medical supply store but could not afford the $500 charge.  I had a sample of the VIVE compression arm sleeve XXL  for her to try on. Donned on pt and she stated it was comfortable.    Pt resting in recliner at end of session with all needs met.   Therapy Documentation Precautions:  Precautions Precautions: Sternal, Fall Precaution Booklet Issued: Yes (comment) Recall of Precautions/Restrictions: Intact Precaution/Restrictions Comments: good recall of precautions Restrictions Weight Bearing Restrictions Per Provider Order: Yes RUE Weight Bearing Per Provider Order: Non weight bearing LUE Weight Bearing Per Provider Order: Non weight bearing RLE Weight Bearing Per Provider Order: Weight bearing as tolerated LLE Weight Bearing Per Provider Order: Weight bearing as tolerated Other Position/Activity Restrictions: sternal   Pain: Pain Assessment Pain Score: 0-No pain      Therapy/Group: Individual Therapy  Sulamita Lafountain 09/01/2023, 12:34 PM

## 2023-09-01 NOTE — Progress Notes (Signed)
 Inpatient Rehabilitation  Patient information reviewed and entered into eRehab system by Feliberto Gottron, M.A., CCC-SLP, Rehab Quality Coordinator.  Information including medical coding, functional ability and quality indicators will be reviewed and updated through discharge.

## 2023-09-01 NOTE — Plan of Care (Signed)
  Problem: Consults Goal: RH GENERAL PATIENT EDUCATION Description: See Patient Education module for education specifics. Outcome: Progressing   Problem: RH BOWEL ELIMINATION Goal: RH STG MANAGE BOWEL WITH ASSISTANCE Description: STG Manage Bowel with min Assistance. Outcome: Progressing   Problem: RH BLADDER ELIMINATION Goal: RH STG MANAGE BLADDER WITH ASSISTANCE Description: STG Manage Bladder With min Assistance Outcome: Progressing   Problem: RH SAFETY Goal: RH STG ADHERE TO SAFETY PRECAUTIONS W/ASSISTANCE/DEVICE Description: STG Adhere to Safety Precautions With min Assistance/Device. Outcome: Progressing   Problem: RH PAIN MANAGEMENT Goal: RH STG PAIN MANAGED AT OR BELOW PT'S PAIN GOAL Description: <4 w/ prns Outcome: Progressing   Problem: RH KNOWLEDGE DEFICIT GENERAL Goal: RH STG INCREASE KNOWLEDGE OF SELF CARE AFTER HOSPITALIZATION Description: Manage increase knowledge of self care after hospitalization with min assistance from son using educational materials provided Outcome: Progressing

## 2023-09-01 NOTE — Progress Notes (Signed)
 Inpatient Rehabilitation Care Coordinator Assessment and Plan Patient Details  Name: Melissa Bray MRN: [id number] Date of Birth: 09/06/1960  Today's Date: 09/01/2023  Hospital Problems: Principal Problem:   Debility  Past Medical History:  Past Medical History:  Diagnosis Date   Anxiety    Breast cancer (HCC)    Diabetes mellitus without complication (HCC)    Hyperlipidemia    Hypertension    Type 2 diabetes mellitus without retinopathy (HCC) 06/17/2017   Past Surgical History:  Past Surgical History:  Procedure Laterality Date   ABDOMINAL HYSTERECTOMY     BREAST BIOPSY     CARDIOVERSION N/A 08/22/2023   Procedure: CARDIOVERSION;  Surgeon: Lauralee Poll, MD;  Location: [facility] INVASIVE CV LAB;  Service: Cardiovascular;  Laterality: N/A;   CLIPPING OF ATRIAL APPENDAGE  08/13/2023   Procedure: CLIPPING, LEFT ATRIAL APPENDAGE USING ATRICURE LAA EXCLUSION SYSTEM SIZE 40;  Surgeon: [name], MD;  Location: [facility] OR;  Service: Open Heart Surgery;;   IABP INSERTION N/A 08/08/2023   Procedure: IABP Insertion;  Surgeon: Swaziland, Peter M, MD;  Location: [facility] CV [facility];  Service: Cardiovascular;  Laterality: N/A;   INTRAOPERATIVE TRANSESOPHAGEAL ECHOCARDIOGRAM N/A 08/13/2023   Procedure: ECHOCARDIOGRAM, TRANSESOPHAGEAL, INTRAOPERATIVE;  Surgeon: [name], MD;  Location: [facility] OR;  Service: Open Heart Surgery;  Laterality: N/A;   MASTECTOMY Left 2016   MITRAL VALVE REPLACEMENT N/A 08/13/2023   Procedure: MITRAL VALVE REPLACEMENT USING ON-X PROSTHETIC MITRAL HEART VALVE SIZE 31/33MM;  Surgeon: [name], MD;  Location: [facility] OR;  Service: Open Heart Surgery;  Laterality: N/A;   RIGHT/LEFT HEART CATH AND CORONARY ANGIOGRAPHY N/A 08/08/2023   Procedure: RIGHT/LEFT HEART CATH AND CORONARY ANGIOGRAPHY;  Surgeon: Swaziland, Peter M, MD;  Location: [facility] INVASIVE CV LAB;  Service: Cardiovascular;  Laterality: N/A;   TOOTH EXTRACTION N/A 08/07/2023   Procedure: EXTRACTION TEETH  NUMBERS ELEVEN, THIRTEEN, AND FOURTEEN;  Surgeon: Ascencion Lava, DMD;  Location: [facility] OR;  Service: Oral Surgery;  Laterality: N/A;   TRANSESOPHAGEAL ECHOCARDIOGRAM (CATH LAB) N/A 08/04/2023   Procedure: TRANSESOPHAGEAL ECHOCARDIOGRAM;  Surgeon: Hazle Lites, MD;  Location: [facility] INVASIVE CV LAB;  Service: Cardiovascular;  Laterality: N/A;   TRANSESOPHAGEAL ECHOCARDIOGRAM (CATH LAB) N/A 08/22/2023   Procedure: TRANSESOPHAGEAL ECHOCARDIOGRAM;  Surgeon: Lauralee Poll, MD;  Location: [facility] INVASIVE CV LAB;  Service: Cardiovascular;  Laterality: N/A;   Social History:  reports that she has never smoked. She has never been exposed to tobacco smoke. She has never used smokeless tobacco. She reports that she does not drink alcohol and does not use drugs.  Family / Support Systems Marital Status: Divorced Patient Roles: Parent, Other (Comment) (sibling) Children: Bambi Lever son [phone] Other Supports: Patricia-sister [phone]  Toni-sister [phone] Anticipated Caregiver: Son and sister's Ability/Limitations of Caregiver: Between all someone can be there with her. made aware probably will go home with IV antibiotics, will see what ID says pt thought she would be switched to oral Caregiver Availability: 24/7 Family Dynamics: Close knit family who will make sure pt has what she needs at home. 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 Return to Work Plans: Plans to retire now Marine scientist Issues: NA Guardian/Conservator: None-according  to MD pt is capable of making her own decisions   Abuse/Neglect Abuse/Neglect Assessment Can  Be Completed: Yes Physical Abuse: Denies Verbal Abuse: Denies Sexual Abuse: Denies Exploitation of patient/patient's resources: Denies Self-Neglect: Denies  Patient response to: Social Isolation - How often do you feel lonely or isolated from those around you?: Never  Emotional Status Pt's affect, behavior and adjustment status: Pt is motivated to recover from her health isuses, she is doing well and recovering and hopeful she will not need IV antibiotics at DC. Has good family support Recent Psychosocial Issues: other health issues Psychiatric History: No history seems to be coping well and positve regarding recovery and moving past this. Substance Abuse History: NA  Patient / Family Perceptions, Expectations & Goals Pt/Family understanding of illness & functional limitations: Pt is able to explain her infection and wound, she hopes to do well here and get as independrent as she can be when she leaves here. Premorbid pt/family roles/activities: mom, sibling, friend, etc Anticipated changes in roles/activities/participation: resume Pt/family expectations/goals: Pt states: " I hope to do well and will not need IV's when I leave."  [employer]: None Premorbid Home Care/DME Agencies: None Transportation available at discharge: son Is the patient able to respond to transportation needs?: Yes In the past 12 months, has lack of transportation kept you from medical appointments or from getting medications?: No In the past 12 months, has lack of transportation kept you from meetings, work, or from getting things needed for daily living?: No  Discharge Planning Living Arrangements: Children Support Systems: Children, Other relatives, Friends/neighbors Type of Residence: Private residence Insurance Resources: Media planner (specify) Herbalist) Financial Resources: Employment Surveyor, quantity Screen Referred: Yes Living Expenses: Rent Money Management: Patient,  Family Does the patient have any problems obtaining your medications?: No Home Management: self Patient/Family Preliminary Plans: Plans to return home with family who may be able to assist. Goals set for independent and will see if will need IV antibiotics for home. Reports she has medicare but may not kick inuntil August when she turns 67. Care Coordinator Barriers to Discharge: Wound Care Care Coordinator Anticipated Follow Up Needs: HH/OP  Clinical Impression Pleasant female who is doing well and progressing. Will await conference Wed and see what her needs are.   Melissa Bray 09/01/2023, 10:07 AM

## 2023-09-01 NOTE — Progress Notes (Signed)
 Physical Therapy Session Note  Patient Details  Name: Melissa Bray MRN: [id number] Date of Birth: 02/06/1961  Today's Date: 09/01/2023 PT Individual Time: 1102-1150 PT Individual Time Calculation (min): 48 min  and Today's Date: 09/01/2023 PT Missed Time: 12 Minutes Missed Time Reason: Wound care  Short Term Goals: Week 1:  PT Short Term Goal 1 (Week 1): none d/t ELOS  Skilled Therapeutic Interventions/Progress Updates:      Pt seated in recliner upon arrival. Pt agreeable to therapy. Pt denies any pain. Pt able to provide teach back of sternal precautions.   SpO2 monitored throughout session, pt on room air, Pt SpO2 100 at rest and with activity.   PT managed IV throughout session.   Primary PT verified PLOF, home set up and DME.   Introduced Occupational hygienist. Pt ambulated 3x200+ feet with supervision with use of rollator, verbal cues provided for forward gaze and initiation of seated rest breaks as needed with fatigue. Education provided for energy conservation and recognizing signs and symptoms of fatigue.   Demonstration provided for technique with seated rest break on rollator. Pt demonstrates improved carry over throughout session. Pt required prolonged seated rest breaks for management of fatigue/SOB.   Pt performed sit to stand with no AD and supervision with B UE on knees.   Pt performed ambualtory transfer to recliner with supervision.   Pt seated in recliner with all needs within reach and chair alarm on.   Nurse present to change sternal dressing at end of session. Pt missed 12 minutes 2/2 wound care.     Therapy Documentation Precautions:  Precautions Precautions: Sternal, Fall Precaution Booklet Issued: Yes (comment) Recall of Precautions/Restrictions: Intact Precaution/Restrictions Comments: good recall of precautions Restrictions Weight Bearing Restrictions Per Provider Order: Yes RUE Weight Bearing Per Provider Order: Non weight bearing LUE Weight Bearing Per  Provider Order: Non weight bearing RLE Weight Bearing Per Provider Order: Weight bearing as tolerated LLE Weight Bearing Per Provider Order: Weight bearing as tolerated Other Position/Activity Restrictions: sternal   Therapy/Group: Individual Therapy  [facility] Jenney Modest, [place], DPT  09/01/2023, 7:49 AM

## 2023-09-02 ENCOUNTER — Other Ambulatory Visit (HOSPITAL_BASED_OUTPATIENT_CLINIC_OR_DEPARTMENT_OTHER): Payer: Self-pay

## 2023-09-02 ENCOUNTER — Telehealth (HOSPITAL_COMMUNITY): Payer: Self-pay

## 2023-09-02 DIAGNOSIS — I330 Acute and subacute infective endocarditis: Secondary | ICD-10-CM | POA: Diagnosis not present

## 2023-09-02 DIAGNOSIS — I10 Essential (primary) hypertension: Secondary | ICD-10-CM | POA: Diagnosis not present

## 2023-09-02 DIAGNOSIS — E119 Type 2 diabetes mellitus without complications: Secondary | ICD-10-CM | POA: Diagnosis not present

## 2023-09-02 DIAGNOSIS — R5381 Other malaise: Secondary | ICD-10-CM | POA: Diagnosis not present

## 2023-09-02 LAB — GLUCOSE, CAPILLARY
Glucose-Capillary: 125 mg/dL — ABNORMAL HIGH (ref 70–99)
Glucose-Capillary: 147 mg/dL — ABNORMAL HIGH (ref 70–99)
Glucose-Capillary: 137 mg/dL — ABNORMAL HIGH (ref 70–99)
Glucose-Capillary: 124 mg/dL — ABNORMAL HIGH (ref 70–99)

## 2023-09-02 LAB — PROTIME-INR
INR: 3.3 — ABNORMAL HIGH (ref 0.8–1.2)
Prothrombin Time: 34.2 s — ABNORMAL HIGH (ref 11.4–15.2)

## 2023-09-02 MED ORDER — CARVEDILOL 6.25 MG PO TABS
6.2500 mg | ORAL_TABLET | Freq: Two times a day (BID) | ORAL | Status: DC
  Administered 2023-09-02 – 2023-09-05 (×6): 6.25 mg via ORAL
  Filled 2023-09-02 (×6): qty 1

## 2023-09-02 MED ORDER — WARFARIN SODIUM 5 MG PO TABS
5.0000 mg | ORAL_TABLET | Freq: Once | ORAL | Status: AC
  Administered 2023-09-02: 5 mg via ORAL
  Filled 2023-09-02: qty 1

## 2023-09-02 NOTE — Progress Notes (Signed)
 Physical Therapy Session Note  Patient Details  Name: Melissa Bray MRN: [id number] Date of Birth: 11/30/1960  Today's Date: 09/02/2023 PT Individual Time: 4098-1191, 1359-1445 PT Individual Time Calculation (min): 74 min, 46 min   Short Term Goals: Week 1:  PT Short Term Goal 1 (Week 1): none d/t ELOS  Skilled Therapeutic Interventions/Progress Updates:      Treatment Session 1  Pt seated in recliner upon arrival. Pt agreeable to therapy. Pt denies any pain but reports nausea from pain medicine this AM. Nurse aware.   Pt requesting to stay in room during session 2/2 feeling groggy. Pt reports feeling better throughout session with mobility.    Pt ambulated recliner to [facility] over toilet with Rollator and close supervision/CGA 2/2 urgency, verbal cues provided for locking brakes prior to sitting/standing. Therpaist managing IV pole.   Pt continent of bladder. Pt performed pericare with mod I while seated.   Pt performed the following therex while seated for B LE strengthening/endurance/activity tolerance/dynamic balance    1x15 LAQ with 1.5# ankle weight B   1x15 seated marching with 1.5# ankle weight B   1x20 seated heel/toe raises with 1.5# ankle weight B   2x5 sit to stands--pt self initiated seated rest break after 5 2/2 SOB/fatigue, verbal cue provided for maintenance of sternal precautions.   1x10 standing marching with 1.5# ankle weight B with no UE support and CGA  1x10 standing hip abductionB, 1x10 standing hip extension B with 1.5# ankle weight B with B UE support on rollator, verbal and tactile cues provided for slow/controlled movement, and upright posture/forwared gaze   Education provided regarding the deconditioning, 5 P's of energy conservation, and Safe movement after heart surgery. Handouts provided for each. Pt verbalized understanding and agreeable.   Treatment Session 2    Pt seated in recliner upon arrival. Pt agreeable to therapy. Pt denies any pain. Therapist  managing IV pole throughout session.   Pt ambulated room to main gym with rollator and supervision.   Pt navigated 8 6 inch steps with R UE support and CGA, verbal cues provided for sequencing.   Pt attemtped stair navigation without handrail on 6 inch step however pt self deferred 2/2 FOF.   Pt ambulated 138 feet with R UE support only on IV pole and CGA, verbal cues provided for forward gaze and reciprocla gait.   Pt ambulated 367 feet with rollator and supervision, verbal cues provided for increased step length/gait speed.   Pt seated in recliner with all needs within reach and chair alarm on.                  Therapy Documentation Precautions:  Precautions Precautions: Sternal, Fall Precaution Booklet Issued: Yes (comment) Recall of Precautions/Restrictions: Intact Precaution/Restrictions Comments: good recall of precautions Restrictions Weight Bearing Restrictions Per Provider Order: Yes RUE Weight Bearing Per Provider Order: Non weight bearing LUE Weight Bearing Per Provider Order: Non weight bearing RLE Weight Bearing Per Provider Order: Weight bearing as tolerated LLE Weight Bearing Per Provider Order: Weight bearing as tolerated Other Position/Activity Restrictions: sternal  Therapy/Group: Individual Therapy  [facility] Jenney Modest, [place], DPT  09/02/2023, 9:49 AM

## 2023-09-02 NOTE — Plan of Care (Signed)
  Problem: Consults Goal: RH GENERAL PATIENT EDUCATION Description: See Patient Education module for education specifics. Outcome: Progressing   Problem: RH BOWEL ELIMINATION Goal: RH STG MANAGE BOWEL WITH ASSISTANCE Description: STG Manage Bowel with min Assistance. Outcome: Progressing   Problem: RH BLADDER ELIMINATION Goal: RH STG MANAGE BLADDER WITH ASSISTANCE Description: STG Manage Bladder With min Assistance Outcome: Progressing   Problem: RH SAFETY Goal: RH STG ADHERE TO SAFETY PRECAUTIONS W/ASSISTANCE/DEVICE Description: STG Adhere to Safety Precautions With min Assistance/Device. Outcome: Progressing   Problem: RH PAIN MANAGEMENT Goal: RH STG PAIN MANAGED AT OR BELOW PT'S PAIN GOAL Description: <4 w/ prns Outcome: Progressing   Problem: RH KNOWLEDGE DEFICIT GENERAL Goal: RH STG INCREASE KNOWLEDGE OF SELF CARE AFTER HOSPITALIZATION Description: Manage increase knowledge of self care after hospitalization with min assistance from son using educational materials provided Outcome: Progressing

## 2023-09-02 NOTE — Telephone Encounter (Signed)
 Outside Phase II ref, fax to [facility].

## 2023-09-02 NOTE — Progress Notes (Signed)
 Occupational Therapy Session Note  Patient Details  Name: Melissa Bray MRN: [id number] Date of Birth: 1960/11/03  Today's Date: 09/02/2023 OT Individual Time: 5366-4403 OT Individual Time Calculation (min): 70 min    Short Term Goals: Week 1:  OT Short Term Goal 1 (Week 1): The pt will tolerate > 30 minutes of OT activity with 1 to 2 rest breaks. OT Short Term Goal 2 (Week 1): The pt will complete a tub transfer with  ModI using a shower chair with extension with good safety awareness. OT Short Term Goal 3 (Week 1): The pt will complete LB bathing/dressing with ModI and full adherence to sternal precautions. OT Short Term Goal 4 (Week 1): The pt will complete simple home making task at ModI  with full adherence to sternal precaution and good safety measures.   Skilled Therapeutic Interventions/Progress Updates:    Pt up in recliner at time of session, no pain. States she had pain meds this morning and is a little groggy from that - still able to participate. Pt with some questions about DC planning, home IV, etc and answered as able and let pt know that DC date would be planned at conference. Pt performing sit <> stands and short distance mobility/transfers with rollator and SBA throughout, good adherence to sternal precautions. Set up at sink and bathing from rollator level to simulate home environment - able to wash feet in figure four and OT assisting with buttocks only - educated on techniques but too nervous to attempt this date. Seated oral hygiene, grooming tasks. Note MD present for rounds, cardio as well, NT for vitals, etc throughout session. Donning new gowns with assist for buttons - discussed clothing options for home too. TEDS with total A and slip on shoes with Set up. Hand off to nursing at end of session pt up in recliner call bell in reach all needs met.   Therapy Documentation Precautions:  Precautions Precautions: Sternal, Fall Precaution Booklet Issued: Yes  (comment) Recall of Precautions/Restrictions: Intact Precaution/Restrictions Comments: good recall of precautions Restrictions Weight Bearing Restrictions Per Provider Order: Yes RUE Weight Bearing Per Provider Order: Non weight bearing LUE Weight Bearing Per Provider Order: Non weight bearing RLE Weight Bearing Per Provider Order: Weight bearing as tolerated LLE Weight Bearing Per Provider Order: Weight bearing as tolerated Other Position/Activity Restrictions: sternal    Therapy/Group: Individual Therapy  Doroteo Gasmen 09/02/2023, 7:22 AM

## 2023-09-02 NOTE — Progress Notes (Signed)
      [address]       Melissa Bray [address]             [phone]      Social visit  Up in chair, feels well, getting stronger  Wound is clean and healing  INR 3.3  Landon Pinion C. Luna Salinas, MD [facility] [phone]

## 2023-09-02 NOTE — Progress Notes (Signed)
 PHARMACY - ANTICOAGULATION CONSULT NOTE  Pharmacy Consult for warfarin Indication: onX mMVR  No Known Allergies  Patient Measurements: Height: 5\' 5"  (165.1 cm) Weight: 115.4 kg (254 lb 6.6 oz) IBW/kg (Calculated) : 57 HEPARIN  DW (KG): 84.4  Vital Signs: Temp: 97.9 F (36.6 C) (06/03 0817) Temp Source: Oral (06/03 0817) BP: 154/100 (06/03 0817) Pulse Rate: 96 (06/03 0817)  Labs: Recent Labs    08/31/23 0805 09/01/23 0550 09/02/23 0417  HGB  --  9.1*  --   HCT  --  28.8*  --   PLT  --  290  --   LABPROT 30.3* 29.3* 34.2*  INR 2.9* 2.8* 3.3*  CREATININE  --  1.02*  --     Estimated Creatinine Clearance: 70.7 mL/min (A) (by C-G formula based on SCr of 1.02 mg/dL (H)).   Medical History: Past Medical History:  Diagnosis Date   Anxiety    Breast cancer (HCC)    Diabetes mellitus without complication (HCC)    Hyperlipidemia    Hypertension    Type 2 diabetes mellitus without retinopathy (HCC) 06/17/2017    Medications:  Scheduled:   amiodarone   200 mg Oral Daily   anastrozole   1 mg Oral Daily   carvedilol   6.25 mg Oral BID WC   Chlorhexidine  Gluconate Cloth  6 each Topical Q12H   dapagliflozin  propanediol  10 mg Oral Daily   feeding supplement  237 mL Oral BID BM   Gerhardt's butt cream   Topical Daily   insulin  aspart  0-15 Units Subcutaneous TID WC   insulin  aspart  3 Units Subcutaneous TID WC   insulin  glargine-yfgn  20 Units Subcutaneous BID   pantoprazole   40 mg Oral Daily   sacubitril -valsartan   1 tablet Oral BID   sodium chloride  flush  10-40 mL Intracatheter Q12H   spironolactone   12.5 mg Oral Daily   torsemide   20 mg Oral Daily   warfarin  5 mg Oral ONCE-1600   Warfarin - Pharmacist Dosing Inpatient   Does not apply q1600    Assessment: 63 yo F presenting with acute mitral valve endocarditis with perforated mitral leaflet and severe MR. Underwent dental extraction 5/8. Presented for pre-op RHC and developed flash pulmonary edema requiring  intubation - IABP was placed in lab and IV heparin  started. No AC PTA.  Pt now s/p cardiac surgery with onX mMVR placed. Pharmacy consulted to start warfarin 5/16 after IABP removal 5/15. S/p DCCV 5/23 and maintaining NSR. Heparin  bridge dc'd once INR > 2. Amiodarone  started on 5/11  Patient has transferred to inpatient rehab.  Pharmacy continuing with warfarin dosing.  Warfarin doses have been variable with an elevated INR and PRBCs x 1 on 5/22.  INR remains therapeutic today at 3.3 (Goal INR 2.5-3.5).  CBC stable on 09/01/23.  Will attempt to establish stable regimen for discharge.  Goal of Therapy:  INR 2.5-3.5 Monitor platelets by anticoagulation protocol: Yes   Plan:  Warfarin 5 mg PO x 1 Monitor daily INR, CBC, clinical course, s/sx of bleed, PO intake/diet, Drug-Drug Interactions   Thank you for allowing pharmacy to be a part of this patient's care.   Alisa Irish, RPh Clinical Pharmacist 09/02/2023 11:57 AM  **Pharmacist phone directory can be found on [email or web address] listed under [facility]**

## 2023-09-02 NOTE — Progress Notes (Signed)
 PROGRESS NOTE   Subjective/Complaints:  Pt at sink ready to work with OT. No new complaints today. Feels well  ROS: Patient denies fever, rash, sore throat, blurred vision, dizziness, nausea, vomiting, diarrhea, cough, shortness of breath or chest pain, joint or back/neck pain, headache, or mood change.   Objective:   No results found. Recent Labs    09/01/23 0550  WBC 4.9  HGB 9.1*  HCT 28.8*  PLT 290   Recent Labs    09/01/23 0550  NA 136  K 3.9  CL 104  CO2 26  GLUCOSE 74  BUN 12  CREATININE 1.02*  CALCIUM  8.7*    Intake/Output Summary (Last 24 hours) at 09/02/2023 0952 Last data filed at 09/02/2023 0700 Gross per 24 hour  Intake 2562.89 ml  Output --  Net 2562.89 ml     Pressure Injury 08/27/23 Buttocks Other (Comment);Right Stage 2 -  Partial thickness loss of dermis presenting as a shallow open injury with a red, pink wound bed without slough. Small pink are stage 2 right outer buttocks (Active)  08/27/23 0010  Location: Buttocks  Location Orientation: Other (Comment);Right (outer)  Staging: Stage 2 -  Partial thickness loss of dermis presenting as a shallow open injury with a red, pink wound bed without slough.  Wound Description (Comments): Small pink are stage 2 right outer buttocks  Present on Admission: Yes    Physical Exam: Vital Signs Blood pressure (!) 154/100, pulse 96, temperature 97.9 F (36.6 C), resp. rate 16, height 5\' 5"  (1.651 m), weight 115.4 kg, SpO2 99%.  Constitutional: No distress . Vital signs reviewed. HEENT: NCAT, EOMI, oral membranes moist Neck: supple Cardiovascular: RRR without murmur. No JVD    Respiratory/Chest: CTA Bilaterally without wheezes or rales. Normal effort    GI/Abdomen: BS +, non-tender, non-distended Ext: no clubbing, cyanosis, or edema Psych: pleasant and cooperative  Musculoskeletal:     Cervical back: Normal range of motion.     Right lower leg:  Edema present.     Left lower leg: Edema present.  Skin:    General: Skin is warm and dry.     Comments: Old left mastectomy scar present, chest incision CDI Neurological:     Comments: Alert and oriented x 3. Normal insight and awareness. Intact Memory. Normal language and speech. Cranial nerve exam unremarkable. MMT: BUE motor 4+ to 5/5 prox to distal. BLE: 4 HF, KE and 4+ ADF/PF. Did not appreciate any focal sensory loss or abnl tone. DTR's 1+. No ataxia.    Neuro exam unchanged from the above on reexamination 09/02/23    Assessment/Plan: 1. Functional deficits which require 3+ hours per day of interdisciplinary therapy in a comprehensive inpatient rehab setting. Physiatrist is providing close team supervision and 24 hour management of active medical problems listed below. Physiatrist and rehab team continue to assess barriers to discharge/monitor patient progress toward functional and medical goals  Care Tool:  Bathing    Body parts bathed by patient: Right arm, Left arm, Chest, Abdomen, Front perineal area, Face, Left upper leg, Right upper leg, Left lower leg, Right lower leg     Body parts n/a: Buttocks   Bathing assist Assist  Level: Minimal Assistance - Patient > 75%     Upper Body Dressing/Undressing Upper body dressing   What is the patient wearing?: Hospital gown only    Upper body assist Assist Level: Minimal Assistance - Patient > 75%    Lower Body Dressing/Undressing Lower body dressing      What is the patient wearing?: Hospital gown only     Lower body assist Assist for lower body dressing: Minimal Assistance - Patient > 75% (Patient was able to simulate LB dressing using theraband with MinA)     Toileting Toileting    Toileting assist Assist for toileting: Moderate Assistance - Patient 50 - 74%     Transfers Chair/bed transfer  Transfers assist     Chair/bed transfer assist level: Supervision/Verbal cueing      Locomotion Ambulation   Ambulation assist      Assist level: Supervision/Verbal cueing Assistive device: Rollator Max distance: 268 feet   Walk 10 feet activity   Assist     Assist level: Supervision/Verbal cueing Assistive device: Rollator   Walk 50 feet activity   Assist    Assist level: Supervision/Verbal cueing Assistive device: Rollator    Walk 150 feet activity   Assist    Assist level: Supervision/Verbal cueing Assistive device: Rollator    Walk 10 feet on uneven surface  activity   Assist     Assist level: Contact Guard/Touching assist Assistive device: Walker-rolling   Wheelchair     Assist Is the patient using a wheelchair?: No             Wheelchair 50 feet with 2 turns activity    Assist            Wheelchair 150 feet activity     Assist          Blood pressure (!) 154/100, pulse 96, temperature 97.9 F (36.6 C), resp. rate 16, height 5\' 5"  (1.651 m), weight 115.4 kg, SpO2 99%.  Medical Problem List and Plan: 1. Functional deficits secondary to debility after bacterial endocarditis/status post mitral valve replacement 08/12/2023 with multiple associated complications.   -Sternal precautions             -patient may shower             -ELOS/Goals: 7-10 days, mod I to supervision goals.   -Continue CIR therapies including PT, OT   2.  Antithrombotics: -DVT/anticoagulation:  Pharmaceutical: Coumadin .  INR range 2.5-3.5 -monitor labs daily, pharmacy protocol             -antiplatelet therapy: N/A 3. Pain Management: Oxycodone  as needed, tramadol  4. Mood/Behavior/Sleep: Klonopin  0.5 mg twice daily as needed anxiety, melatonin 5 mg nightly as needed             -antipsychotic agents: N/A 5. Neuropsych/cognition: This patient is capable of making decisions on her own behalf. 6. Skin/Wound Care: Routine skin checks 7. Fluids/Electrolytes/Nutrition: Routine in and outs with follow-up chemistries 8.  Acute blood  loss anemia.  Follow-up CBC 9.  ID/bacterial endocarditis.  Penicillin  G initiated 08/13/2023.  Continue through 09/24/2023.   - weekly labs for monitoring - Follow-up with infectious disease as outpt --[facility] 09/29/23 @0930  -6/2  labs reviewed and stable  10.  Hypertension.  Coreg  3.125 mg twice daily, Entresto  24-26 mg twice daily, Aldactone  12.5 mg daily, Demadex  20 mg daily, Farxiga  10 mg daily  - 6/3 increased DBP over last 36 hours. Unclear of source. Will adjust coreg  to 6.25 mg bid and  obsv (HR in 90's)    09/02/2023    8:17 AM 09/02/2023    6:00 AM 09/02/2023    4:40 AM  Vitals with BMI  Weight  254 lbs 7 oz   BMI  42.34   Systolic 154  152  Diastolic 100  90  Pulse 96  93    11.  Atrial flutter with RVR.  Status post DCCV 5/23.  Maintaining normal sinus rhythm.  Amiodarone  200 mg daily             -rate remains well controlled at present 12.  Diabetes mellitus.  Hemoglobin A1c 12.8.  Currently on NovoLog  3 units 3 times daily with meals, Semglee  25 units twice daily.  Prior to admission patient on Glucotrol  XL 10 mg daily, Lantus  insulin  20 units daily.   -reasonable control at present -Resume as needed -5-31: Tight control last night and this a.m., reduce Semglee  to 22 units twice daily--improved Recent Labs    09/01/23 1640 09/01/23 2034 09/02/23 0626  GLUCAP 107* 160* 125*    6/3 reduced semglee  further to 20u bid given recent hypoglycemia  13.  History of breast cancer status post left mastectomy 2016.  Continue Arimidex  and follow-up outpatient 14.  Class III morbid obesity.  BMI 42.67.  Dietary follow-up    LOS: 4 days A FACE TO FACE EVALUATION WAS PERFORMED  Rawland Caddy 09/02/2023, 9:52 AM

## 2023-09-03 ENCOUNTER — Other Ambulatory Visit: Payer: Self-pay

## 2023-09-03 DIAGNOSIS — R4589 Other symptoms and signs involving emotional state: Secondary | ICD-10-CM | POA: Diagnosis not present

## 2023-09-03 DIAGNOSIS — I10 Essential (primary) hypertension: Secondary | ICD-10-CM | POA: Diagnosis not present

## 2023-09-03 DIAGNOSIS — I330 Acute and subacute infective endocarditis: Secondary | ICD-10-CM | POA: Diagnosis not present

## 2023-09-03 DIAGNOSIS — R5381 Other malaise: Secondary | ICD-10-CM | POA: Diagnosis not present

## 2023-09-03 DIAGNOSIS — E119 Type 2 diabetes mellitus without complications: Secondary | ICD-10-CM | POA: Diagnosis not present

## 2023-09-03 HISTORY — DX: Other symptoms and signs involving emotional state: R45.89

## 2023-09-03 LAB — GLUCOSE, CAPILLARY
Glucose-Capillary: 108 mg/dL — ABNORMAL HIGH (ref 70–99)
Glucose-Capillary: 130 mg/dL — ABNORMAL HIGH (ref 70–99)
Glucose-Capillary: 174 mg/dL — ABNORMAL HIGH (ref 70–99)
Glucose-Capillary: 124 mg/dL — ABNORMAL HIGH (ref 70–99)
Glucose-Capillary: 137 mg/dL — ABNORMAL HIGH (ref 70–99)

## 2023-09-03 LAB — PROTIME-INR
INR: 3.6 — ABNORMAL HIGH (ref 0.8–1.2)
Prothrombin Time: 36 s — ABNORMAL HIGH (ref 11.4–15.2)

## 2023-09-03 MED ORDER — WARFARIN SODIUM 4 MG PO TABS
4.0000 mg | ORAL_TABLET | Freq: Once | ORAL | Status: AC
  Administered 2023-09-03: 4 mg via ORAL
  Filled 2023-09-03: qty 1

## 2023-09-03 NOTE — Consult Note (Addendum)
 Neuropsychological Consultation Comprehensive Inpatient Rehab   Patient:   Melissa Bray   DOB:   1960/06/28  MR Number:  [id number]  Location:  [facility] [address] [place] [address] Dept: [phone] Loc: [phone]           Date of Service:   09/03/2023  Start Time:   1 PM End Time:   2 PM  Provider/Observer:  Chapman Commodore, Psy.D.       Clinical Neuropsychologist       Billing Code/Service: [phone]  Reason for Service:    Melissa Bray is a 63 year old female referred for neuropsychological consultation due to coping adjustment with extended hospital stay and currently admitted to the comprehensive inpatient rehabilitation unit.  Patient has a past medical history including diabetes, hypertension, hyperlipidemia, left mastectomy in 2016, obesity.  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  Patient presented on 07/31/2023 with suspected sepsis/cellulitis.  Workup identified murmur and patient was transferred to [facility] for further workup demonstrating severe mitral regurgitation due to endocarditis.  The following day the patient did deteriorate and went into cardiogenic shock and was intubated etc.  Patient had cardioversion on 08/22/2023.  Patient on antibiotics until 6/25.  Patient admitted to CIR due to debility and multiple medical issues.  During today's visit the patient was awake and alert x 4 with good cognition.  She was basically aware of what is going on with her medically.  Patient reports that she is looking forward to her discharge on Friday but is somewhat disappointed that she is unlikely to be able to attend her grandson's graduation that is coming up in 2 weeks.  Patient reports good support at home post discharge and she denies any significant anxiety about discharge.  Patient reports that her mood state has improved and notes that she had some symptoms of  depression and frustration with her initial admission and concern over her health and safety.  Patient will continue with IV antibiotic post discharge and will have nurse come in to facilitate this process.  HPI for the current admission:    HPI: Melissa Bray is a 63 year old right-handed female with history significant for type 2 diabetes mellitus, essential hypertension, hyperlipidemia, breast cancer status post left mastectomy 2016, class III morbid obesity BMI 42.67. Per chart review patient lives with son. 1 level home 2 steps to enter. 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 Presented 07/31/2023 to [facility] for suspected sepsis due to left upper extremity cellulitis. She was found to have significant edema to left upper extremity was treated for sepsis/cellulitis when a murmur was noted. She was transferred to [facility] and a TEE was performed on 5/5 which demonstrated severe mitral regurgitation due to endocarditis and ejection fraction of 55 to 60%. Patient also had extraction of teeth 5/8. The following day she deteriorated went to cardiogenic shock. She was intubated and intra-aortic balloon pump was placed and ultimately underwent mitral valve replacement 08/12/2023 per Dr. Luna Salinas and placed on Coumadin  therapy. She underwent TEE and cardioversion on 08/22/2023. Sternal precautions as indicated. Both cardiothoracic surgery and heart failure team continue to follow. She was placed on penicillin  G per infectious disease recommendations of 6 weeks initiated 08/13/2023 through 09/24/2023. Evaluations completed due to patient's debility and multimedical issues was admitted for a comprehensive rehab program.   Medical History:   Past Medical History:  Diagnosis Date  Anxiety    Breast cancer (HCC)    Diabetes mellitus without complication (HCC)    Hyperlipidemia    Hypertension    Type 2 diabetes mellitus without retinopathy (HCC) 06/17/2017         Patient Active  Problem List   Diagnosis Date Noted   Difficulty coping with disease 09/03/2023   Debility 08/29/2023   Physical deconditioning 08/21/2023   Acute respiratory failure with hypoxia (HCC) 08/21/2023   Acute bacterial endocarditis 08/21/2023   S/P MVR (mitral valve replacement) 08/13/2023   Cardiogenic shock (HCC) 08/08/2023   Shock (HCC) 08/08/2023   Hypomagnesemia 08/06/2023   Hypokalemia 08/06/2023   Hyponatremia 08/06/2023   Sinus tachycardia 08/06/2023   Mitral valve endocarditis 08/03/2023   Severe sepsis (HCC) 08/01/2023   Cellulitis of left upper extremity 08/01/2023   Obesity, Class III, BMI 40-49.9 (morbid obesity) 08/01/2023   Perforation of leaflet of mitral valve 08/01/2023   GAD (generalized anxiety disorder) 08/01/2023   History of breast cancer 08/01/2023   SIRS (systemic inflammatory response syndrome) (HCC) 07/31/2023   Lymphedema of left upper extremity 05/27/2023   Long-term (current) use of injectable non-insulin  antidiabetic drugs 01/29/2023   HLD (hyperlipidemia) 01/29/2023   Nephrolithiasis 10/29/2022   Adenomatous colon polyp 10/29/2022   Colon polyp 01/05/2022   S/P hysterectomy 08/14/2021   Osteopenia 08/14/2021   Morbid obesity (HCC) 09/06/2020   DM2 (diabetes mellitus, type 2) (HCC) 07/15/2019   Neuropathy due to chemotherapeutic drug (HCC) 02/02/2018   Hematuria 02/02/2018   Macular drusen, bilateral 07/09/2017   GERD (gastroesophageal reflux disease) 04/28/2017   Long term (current) use of aromatase inhibitors 11/13/2015   Depression, recurrent (HCC) 09/22/2014   Essential hypertension 09/22/2014   Breast cancer, stage 3 (HCC) 08/31/2014    Behavioral Observation/Mental Status:   Melissa Bray  presents as a 63 y.o.-year-old Right handed African American Female who appeared her stated age. her dress was Appropriate and she was Well Groomed and her manners were Appropriate to the situation.  her participation was indicative of Appropriate and  Attentive behaviors.  There were physical disabilities noted.  she displayed an appropriate level of cooperation and motivation.    Interactions:    Active Appropriate  Attention:   within normal limits and attention span and concentration were age appropriate  Memory:   within normal limits; recent and remote memory intact  Visuo-spatial:   not examined  Speech (Volume):  low  Speech:   normal; normal  Thought Process:  Coherent and Relevant  Coherent, Linear, and Logical  Though Content:  WNL; not suicidal and not homicidal  Orientation:   person, place, time/date, and situation  Judgment:   Good  Planning:   Good  Affect:    Appropriate  Mood:    Dysphoric  Insight:   Good  Intelligence:   normal  Psychiatric History:  Patient has no prior psychiatric history but did develop some significant anxiety around the time of her initial admission that is likely appropriate given her medical status at this time.  Family Med/Psych History:  Family History  Problem Relation Age of Onset   Asthma Mother    Cancer Father        prostate cancer   Diabetes Father    Hypertension Father    Cancer Brother        prostate cancer   Asthma Brother    Colon cancer Neg Hx    Colon polyps Neg Hx    Crohn's disease Neg Hx  Esophageal cancer Neg Hx    Rectal cancer Neg Hx    Stomach cancer Neg Hx    Ulcerative colitis Neg Hx     Impression/DX:   Melissa Bray is a 63 year old female referred for neuropsychological consultation due to coping adjustment with extended hospital stay and currently admitted to the comprehensive inpatient rehabilitation unit.  Patient has a past medical history including diabetes, hypertension, hyperlipidemia, left mastectomy in 2016, obesity.  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  Patient presented on 07/31/2023 with suspected sepsis/cellulitis.  Workup identified murmur and patient was transferred to [facility] for further  workup demonstrating severe mitral regurgitation due to endocarditis.  The following day the patient did deteriorate and went into cardiogenic shock and was intubated etc.  Patient had cardioversion on 08/22/2023.  Patient on antibiotics until 6/25.  Patient admitted to CIR due to debility and multiple medical issues.  During today's visit the patient was awake and alert x 4 with good cognition.  She was basically aware of what is going on with her medically.  Patient reports that she is looking forward to her discharge on Friday but is somewhat disappointed that she is unlikely to be able to attend her grandson's graduation that is coming up in 2 weeks.  Patient reports good support at home post discharge and she denies any significant anxiety about discharge.  Patient reports that her mood state has improved and notes that she had some symptoms of depression and frustration with her initial admission and concern over her health and safety.  Patient will continue with IV antibiotic post discharge and will have nurse come in to facilitate this process.  Patient notes that her initial anxiety has really improved as we get closer to her discharge.     Diagnosis:    Debility with coping styles having initial impact on medical status with improvement.         Electronically Signed   _______________________ Chapman Commodore, Psy.D. Clinical Neuropsychologist

## 2023-09-03 NOTE — Patient Care Conference (Cosign Needed Addendum)
 Inpatient RehabilitationTeam Conference and Plan of Care Update Date: 09/03/2023   Time: 1208 pm    Patient Name: Melissa Bray      Medical Record Number: [id number]  Date of Birth: Sep 10, 1960 Sex: Female         Room/Bed: 4M01C/4M01C-01 Payor Info: Payor: BLUE CROSS BLUE [employer]IELD / Plan: BCBS COMM PPO / Product Type: *No Product type* /    Admit Date/Time:  08/29/2023  3:48 PM  Primary Diagnosis:  Debility  Hospital Problems: Principal Problem:   Debility Active Problems:   Difficulty coping with disease    Expected Discharge Date: Expected Discharge Date: 09/05/23  Team Members Present: Physician leading conference: Dr. Laverle Postin Social Worker Present: Adrianna Albee, LCSW Nurse Present: Jerene Monks, RN PT Present: Jenney Modest, PT OT Present: Tim Fontana, OT     Current Status/Progress Goal Weekly Team Focus  Bowel/Bladder   Continent of b/b   Remain continent   Assist with toileting qshift and prn    Swallow/Nutrition/ Hydration               ADL's   overall supervision to mod I   mod I   endurance, dynamic balance, functional mobility with and without AD; anticipate pt to met goals by Friday    Mobility   bed mobility: supervision/mod I, sit to stand, stand pivot transfer, gait x367 feet with rollator and supervision/mod I, stair navigation x 4 6 inch steps with R UE support on unilateral handrial and CGA with step to gait, pt does not have handrials at home. Pt nephew attending family training this afternoon for stair navigation.   mod I  D/C 6/6 follow up HHPT, barriers: 2 step entry with no handrials-pt reports she is having landlord install handrails but is unsure when it will be complete, IV antibiotics, wound care to sternal incision. Work on endurance, independence with stair navigation, discharge planning, family training for stair navigation with HHA    Communication                Safety/Cognition/ Behavioral Observations                Pain   no c/o pain   Remain pain free   Assess qshift and prn    Skin   Sternum Incision ota   Maintain skin intergrity  Assess qshift and prn      Discharge Planning:  Home with fmaily members trying to ifnd a HH agnecy to accept her BCBS. Will need IV antibitoics at home and follow up    Team Discussion: Patient admitted post debility  after bacterial endocarditis/status post mitral valve replacement 08/12/2023 with multiple associated complications. Patient will be needing IV antibiotics until 09/24/2023. Patient limited by sternal precautions, pain and endurance.  Patient on target to meet rehab goals: yes, currently patient requires supervision to mod I with ADLs and transfers. Patient is able to ambulate up to 72' with supervision using a rollator. Overall goals at discharge are set for mod I assistance.   *See Care Plan and progress notes for long and short-term goals.   Revisions to Treatment Plan:  Sternal precautions PICC line TEDS Family training  Teaching Needs: Safety, medications, PICC line care, wound care, transfers,toileting , dietary modifications,weight management , etc   Current Barriers to Discharge: Decreased caregiver support, Home enviroment access/layout, IV antibiotics, and Wound care  Possible Resolutions to Barriers: Family Education Outpatient follow-up RN IV follow up DME: RW     Medical Summary  Current Status: stable from CV standpoint. bp was elevated this week however. hypoglycemia. good po intake  Barriers to Discharge: Medical stability   Possible Resolutions to [employer] Focus: reduced insulin  to achieve better CBG's. increased coreg  for improved bp control   Continued Need for Acute Rehabilitation Level of Care: The patient requires daily medical management by a physician with specialized training in physical medicine and rehabilitation for the following reasons: Direction of a multidisciplinary physical rehabilitation  program to maximize functional independence : Yes Medical management of patient stability for increased activity during participation in an intensive rehabilitation regime.: Yes Analysis of laboratory values and/or radiology reports with any subsequent need for medication adjustment and/or medical intervention. : Yes   I attest that I was present, lead the team conference, and concur with the assessment and plan of the team.   Janis Sol Gayo 09/03/2023, 1208 pm

## 2023-09-03 NOTE — Progress Notes (Signed)
 PHARMACY - ANTICOAGULATION CONSULT NOTE  Pharmacy Consult for warfarin Indication: onX mMVR  No Known Allergies  Patient Measurements: Height: 5\' 5"  (165.1 cm) Weight: 116.5 kg (256 lb 13.4 oz) IBW/kg (Calculated) : 57 HEPARIN  DW (KG): 84.4  Vital Signs: Temp: 98.6 F (37 C) (06/04 1342) Temp Source: Oral (06/04 1342) BP: 142/80 (06/04 1342) Pulse Rate: 94 (06/04 1342)  Labs: Recent Labs    09/01/23 0550 09/02/23 0417 09/03/23 0330  HGB 9.1*  --   --   HCT 28.8*  --   --   PLT 290  --   --   LABPROT 29.3* 34.2* 36.0*  INR 2.8* 3.3* 3.6*  CREATININE 1.02*  --   --     Estimated Creatinine Clearance: 71.1 mL/min (A) (by C-G formula based on SCr of 1.02 mg/dL (H)).   Medical History: Past Medical History:  Diagnosis Date   Anxiety    Breast cancer (HCC)    Diabetes mellitus without complication (HCC)    Hyperlipidemia    Hypertension    Type 2 diabetes mellitus without retinopathy (HCC) 06/17/2017    Medications:  Scheduled:   amiodarone   200 mg Oral Daily   anastrozole   1 mg Oral Daily   carvedilol   6.25 mg Oral BID WC   Chlorhexidine  Gluconate Cloth  6 each Topical Q12H   dapagliflozin  propanediol  10 mg Oral Daily   feeding supplement  237 mL Oral BID BM   Gerhardt's butt cream   Topical Daily   insulin  aspart  0-15 Units Subcutaneous TID WC   insulin  aspart  3 Units Subcutaneous TID WC   insulin  glargine-yfgn  20 Units Subcutaneous BID   pantoprazole   40 mg Oral Daily   sacubitril -valsartan   1 tablet Oral BID   sodium chloride  flush  10-40 mL Intracatheter Q12H   spironolactone   12.5 mg Oral Daily   torsemide   20 mg Oral Daily   Warfarin - Pharmacist Dosing Inpatient   Does not apply q1600    Assessment: 63 yo F presenting with acute mitral valve endocarditis with perforated mitral leaflet and severe MR. Underwent dental extraction 5/8. Presented for pre-op RHC and developed flash pulmonary edema requiring intubation - IABP was placed in lab and  IV heparin  started. No AC PTA.  Pt now s/p cardiac surgery with onX mMVR placed. Pharmacy consulted to start warfarin 5/16 after IABP removal 5/15. S/p DCCV 5/23 and maintaining NSR. Heparin  bridge dc'd once INR > 2. Amiodarone  started on 5/11  Patient has transferred to inpatient rehab.  Pharmacy continuing with warfarin dosing.  Warfarin doses have been variable with an elevated INR and PRBCs x 1 on 5/22.  INR has trended back up today at 3.6 (Goal INR 2.5-3.5) after receiving 6 mg dose on 6/2. CBC stable on 09/01/23.  Will attempt to establish stable regimen for discharge.  Goal of Therapy:  INR 2.5-3.5 Monitor platelets by anticoagulation protocol: Yes   Plan:  Warfarin 4 mg PO x 1.  Monitor daily INR, CBC, clinical course, s/sx of bleed, PO intake/diet, Drug-Drug Interactions Based on last week - Warfarin 5mg  daily may be the right regimen to keep at goal.   Thank you for allowing pharmacy to be a part of this patient's care.   Lenard Quam, PharmD, BCPS, BCCCP Please refer to [facility] for [facility] Pharmacy numbers 09/03/2023 2:02 PM  **Pharmacist phone directory can be found on [email or web address] listed under [facility]**

## 2023-09-03 NOTE — Progress Notes (Addendum)
 Patient ID: Melissa Bray, female   DOB: 30-Mar-1961, 63 y.o.   MRN: [id number]  Met with pt to give team conference update goals of mod/I level and discharge date 6/6. Trying to fond home health agency to accept her [facility] referral for her IV antibiotics. Pt wants a rolling walker have made referral to [employer]. Nephew to come in today for stair training. Will need RN wound education and [name]-Amertis aware of discharge date  2;12 PM All home health agencies have declined pt's referral due to [facility] or that lives in [place]. Will need to use [facility] RN for the IV antibiotics and can offer her OPPT. She will ned to learn her wound care dressing due to nurse will not provide this.

## 2023-09-03 NOTE — Progress Notes (Signed)
 Sternal dressing change completed. Pt educated on dressing change. Pt assisted with procedure. Pt has no further questions.  Varney Gentleman, LPN

## 2023-09-03 NOTE — Progress Notes (Signed)
 Occupational Therapy Session Note  Patient Details  Name: [name] MRN: [id number] Date of Birth: 1960-04-08  Today's Date: 09/03/2023 OT Individual Time: 0865-7846 OT Individual Time Calculation (min): 60 min    Short Term Goals: Week 1:  OT Short Term Goal 1 (Week 1): The pt will tolerate > 30 minutes of OT activity with 1 to 2 rest breaks. OT Short Term Goal 2 (Week 1): The pt will complete a tub transfer with  ModI using a shower chair with extension with good safety awareness. OT Short Term Goal 3 (Week 1): The pt will complete LB bathing/dressing with ModI and full adherence to sternal precautions. OT Short Term Goal 4 (Week 1): The pt will complete simple home making task at ModI  with full adherence to sternal precaution and good safety measures.  Skilled Therapeutic Interventions/Progress Updates:    Pt received sitting up with no c/o pain, agreeable to OT session. Her nephew was present for family training on HHA during stair navigation. Pt completed 100 ft of functional mobility with the RW at (S) level to the therapy gym.  She used the 12 in step as her nephew felt this was the most similar to her large step at home. She completed 3x1 repetitions with his HHA with (S). Good guarding from nephew. Pt completed standing level reciprocal tapping activity with no UE support using a 5 in step 3x15 repetitions. Activity performed to challenge dynamic standing balance and functional activity tolerance to simulate household threshold management and reduce fall risk. Pt required CGA overall. Pt completed standing level reciprocal tapping activity with no UE support using a 5 in step 3x20 repetitions. Activity performed to challenge dynamic standing balance and functional activity tolerance to simulate household threshold management and reduce fall risk. Pt required CGA. She had excellent adherence and carryover of sternal precautions. She completed dynamic stepping activity to simulate home  threshold management using no device, CGA overall. 200 ft of functional mobility added in to end of stepping activity to further challenge functional activity tolerance. X2 repetitions completed. She returned to her room and was left supine with all needs met, family present.   Therapy Documentation Precautions:  Precautions Precautions: Sternal, Fall Precaution Booklet Issued: Yes (comment) Recall of Precautions/Restrictions: Intact Precaution/Restrictions Comments: good recall of precautions Restrictions Weight Bearing Restrictions Per Provider Order: Yes RUE Weight Bearing Per Provider Order: Non weight bearing LUE Weight Bearing Per Provider Order: Non weight bearing RLE Weight Bearing Per Provider Order: Weight bearing as tolerated LLE Weight Bearing Per Provider Order: Weight bearing as tolerated Other Position/Activity Restrictions: sternal Therapy/Group: Individual Therapy  Una Ganser 09/03/2023, 8:01 AM

## 2023-09-03 NOTE — Progress Notes (Signed)
 Peripherally Inserted Central Catheter Placement  The IV Nurse has discussed with the patient and/or persons authorized to consent for the patient, the purpose of this procedure and the potential benefits and risks involved with this procedure.  The benefits include less needle sticks, lab draws from the catheter, and the patient may be discharged home with the catheter. Risks include, but not limited to, infection, bleeding, blood clot (thrombus formation), and puncture of an artery; nerve damage and irregular heartbeat and possibility to perform a PICC exchange if needed/ordered by physician.  Alternatives to this procedure were also discussed.  Bard Power PICC patient education guide, fact sheet on infection prevention and patient information card has been provided to patient /or left at bedside.    PICC Placement Documentation  PICC Single Lumen 09/03/23 Right Basilic 39 cm 0 cm (Active)  Indication for Insertion or Continuance of Line Home intravenous therapies (PICC only) 09/03/23 1758  Exposed Catheter (cm) 0 cm 09/03/23 1758  Site Assessment Clean, Dry, Intact 09/03/23 1758  Line Status Flushed;Saline locked;Blood return noted 09/03/23 1758  Dressing Type Transparent;Securing device 09/03/23 1758  Dressing Status Antimicrobial disc/dressing in place;Clean, Dry, Intact 09/03/23 1758  Line Care Connections checked and tightened 09/03/23 1758  Line Adjustment (NICU/IV Team Only) No 09/03/23 1758  Dressing Intervention New dressing;Adhesive placed at insertion site (IV team only) 09/03/23 1758  Dressing Change Due 09/10/23 09/03/23 1758    PICC Exchanged   Melissa Bray 09/03/2023, 5:59 PM

## 2023-09-03 NOTE — Progress Notes (Signed)
 PROGRESS NOTE   Subjective/Complaints:  Pt up in chair. No new complaints. Feels well!  ROS: Patient denies fever, rash, sore throat, blurred vision, dizziness, nausea, vomiting, diarrhea, cough, shortness of breath or chest pain, joint or back/neck pain, headache, or mood change.   Objective:   No results found. Recent Labs    09/01/23 0550  WBC 4.9  HGB 9.1*  HCT 28.8*  PLT 290   Recent Labs    09/01/23 0550  NA 136  K 3.9  CL 104  CO2 26  GLUCOSE 74  BUN 12  CREATININE 1.02*  CALCIUM  8.7*    Intake/Output Summary (Last 24 hours) at 09/03/2023 0850 Last data filed at 09/03/2023 0800 Gross per 24 hour  Intake 473 ml  Output --  Net 473 ml     Pressure Injury 08/27/23 Buttocks Other (Comment);Right Stage 2 -  Partial thickness loss of dermis presenting as a shallow open injury with a red, pink wound bed without slough. Small pink are stage 2 right outer buttocks (Active)  08/27/23 0010  Location: Buttocks  Location Orientation: Other (Comment);Right (outer)  Staging: Stage 2 -  Partial thickness loss of dermis presenting as a shallow open injury with a red, pink wound bed without slough.  Wound Description (Comments): Small pink are stage 2 right outer buttocks  Present on Admission: Yes    Physical Exam: Vital Signs Blood pressure 118/80, pulse 90, temperature 98.6 F (37 C), temperature source Oral, resp. rate 18, height 5\' 5"  (1.651 m), weight 116.5 kg, SpO2 99%.  Constitutional: No distress . Vital signs reviewed. HEENT: NCAT, EOMI, oral membranes moist Neck: supple Cardiovascular: RRR without murmur. No JVD    Respiratory/Chest: CTA Bilaterally without wheezes or rales. Normal effort    GI/Abdomen: BS +, non-tender, non-distended Ext: no clubbing, cyanosis, or tr edema Psych: pleasant and cooperative  Musculoskeletal:     Cervical back: Normal range of motion.     Right lower leg: Edema present.  improved    Left lower leg: Edema present. improved Skin:    General: Skin is warm and dry.     Comments: Old left mastectomy scar present, chest incision remains CDI Neurological:     Comments: Alert and oriented x 3. Normal insight and awareness. Intact Memory. Normal language and speech. Cranial nerve exam unremarkable. MMT: BUE motor 4+ to 5/5 prox to distal. BLE: 4 HF, KE and 4+ ADF/PF. Did not appreciate any focal sensory loss or abnl tone. DTR's 1+. No ataxia.    Neuro exam unchanged from the above on reexamination 09/03/23    Assessment/Plan: 1. Functional deficits which require 3+ hours per day of interdisciplinary therapy in a comprehensive inpatient rehab setting. Physiatrist is providing close team supervision and 24 hour management of active medical problems listed below. Physiatrist and rehab team continue to assess barriers to discharge/monitor patient progress toward functional and medical goals  Care Tool:  Bathing    Body parts bathed by patient: Right arm, Left arm, Chest, Abdomen, Front perineal area, Face, Left upper leg, Right upper leg, Left lower leg, Right lower leg     Body parts n/a: Buttocks   Bathing assist Assist Level:  Minimal Assistance - Patient > 75%     Upper Body Dressing/Undressing Upper body dressing   What is the patient wearing?: Hospital gown only    Upper body assist Assist Level: Minimal Assistance - Patient > 75%    Lower Body Dressing/Undressing Lower body dressing      What is the patient wearing?: Hospital gown only     Lower body assist Assist for lower body dressing: Minimal Assistance - Patient > 75% (Patient was able to simulate LB dressing using theraband with MinA)     Toileting Toileting    Toileting assist Assist for toileting: Moderate Assistance - Patient 50 - 74%     Transfers Chair/bed transfer  Transfers assist     Chair/bed transfer assist level: Supervision/Verbal cueing      Locomotion Ambulation   Ambulation assist      Assist level: Supervision/Verbal cueing Assistive device: Rollator Max distance: 367 feet   Walk 10 feet activity   Assist     Assist level: Supervision/Verbal cueing Assistive device: Rollator   Walk 50 feet activity   Assist    Assist level: Supervision/Verbal cueing Assistive device: Rollator    Walk 150 feet activity   Assist    Assist level: Supervision/Verbal cueing Assistive device: Rollator    Walk 10 feet on uneven surface  activity   Assist     Assist level: Contact Guard/Touching assist Assistive device: Walker-rolling   Wheelchair     Assist Is the patient using a wheelchair?: No             Wheelchair 50 feet with 2 turns activity    Assist            Wheelchair 150 feet activity     Assist          Blood pressure 118/80, pulse 90, temperature 98.6 F (37 C), temperature source Oral, resp. rate 18, height 5\' 5"  (1.651 m), weight 116.5 kg, SpO2 99%.  Medical Problem List and Plan: 1. Functional deficits secondary to debility after bacterial endocarditis/status post mitral valve replacement 08/12/2023 with multiple associated complications.   -Sternal precautions             -patient may shower             -ELOS/Goals: 7-10 days, mod I to supervision goals.   -Continue CIR therapies including PT and OT. Interdisciplinary team conference today to discuss goals, barriers to discharge, and dc planning.  2.  Antithrombotics: -DVT/anticoagulation:  Pharmaceutical: Coumadin .  INR range 2.5-3.5 -monitor labs daily, pharmacy protocol             -antiplatelet therapy: N/A 3. Pain Management: Oxycodone  as needed, tramadol  4. Mood/Behavior/Sleep: Klonopin  0.5 mg twice daily as needed anxiety, melatonin 5 mg nightly as needed             -antipsychotic agents: N/A 5. Neuropsych/cognition: This patient is capable of making decisions on her own behalf. 6. Skin/Wound Care:  Routine skin checks 7. Fluids/Electrolytes/Nutrition: Routine in and outs with follow-up chemistries 8.  Acute blood loss anemia.  Follow-up CBC 9.  ID/bacterial endocarditis.  Penicillin  G initiated 08/13/2023.  Continue through 09/24/2023.   - weekly labs for monitoring - Follow-up with infectious disease as outpt --[facility] 09/29/23 @0930  -6/2  labs reviewed and stable  10.  Hypertension.  Coreg  3.125 mg twice daily, Entresto  24-26 mg twice daily, Aldactone  12.5 mg daily, Demadex  20 mg daily, Farxiga  10 mg daily  - 6/3 increased DBP over  last 36 hours. Unclear of source. Will adjust coreg  to 6.25 mg bid and obsv (HR in 90's)  6/4- obsv bp for coreg  effect today    09/03/2023    5:43 AM 09/03/2023    5:00 AM 09/02/2023    7:34 PM  Vitals with BMI  Weight  256 lbs 13 oz   BMI  42.74   Systolic 118  129  Diastolic 80  64  Pulse 90  86    11.  Atrial flutter with RVR.  Status post DCCV 5/23.  Maintaining normal sinus rhythm.  Amiodarone  200 mg daily             -rate remains well controlled at present 12.  Diabetes mellitus.  Hemoglobin A1c 12.8.  Currently on NovoLog  3 units 3 times daily with meals, Semglee  25 units twice daily.  Prior to admission patient on Glucotrol  XL 10 mg daily, Lantus  insulin  20 units daily.   -reasonable control at present -Resume as needed -5-31: Tight control last night and this a.m., reduce Semglee  to 22 units twice daily--improved Recent Labs    09/02/23 1632 09/02/23 2151 09/03/23 0629  GLUCAP 137* 124* 108*    6/4 reduced semglee  further to 20u bid given recent hypoglycemia and numbers appear more within range  13.  History of breast cancer status post left mastectomy 2016.  Continue Arimidex  and follow-up outpatient 14.  Class III morbid obesity.  BMI 42.67.  Dietary follow-up    LOS: 5 days A FACE TO FACE EVALUATION WAS PERFORMED  Rawland Caddy 09/03/2023, 8:50 AM

## 2023-09-03 NOTE — Progress Notes (Signed)
 Physical Therapy Session Note  Patient Details  Name: Melissa Bray MRN: [id number] Date of Birth: [birth year]  Today's Date: 09/03/2023 PT Individual Time: 0800-0858 PT Individual Time Calculation (min): 58 min   Short Term Goals: Week 1:  PT Short Term Goal 1 (Week 1): none d/t ELOS  Skilled Therapeutic Interventions/Progress Updates:      Pt seated in recliner upon arrival. Pt agreeable to therapy. Pt denies any pain. Wound care nurse present and finishing dressing change.   Pt ambuated room to ortho gym and performed ambualtory transfer to car simulator with supervision, verbal cues provided for technique and implementation of sternal precautions.   Pt navigated ramp with rollator and mod I.   Pt performed bed mobility on apartment bed with supervision/mod I verbal cues provided for log roll technique for maintenance of sternal precautions.   Pt performed ambulatory transfer to bed, recliner, and couch in rehab apartment with supervision, verbal cues provided for sternal precuations. Education provided to add blankets or cushion to recliner at home for improved ease.   Pt picked up bean bags from various heights with use of reacher and rollator with supervision. Pt demonstrates improved understanding of items accessible versus inaccessible with reached. Recommended pt purchase reacher for home for maintenance of sternal precautions.   Discussed family training. Pt contacted nephew and nephew plans to attend family training during OT session to practice HHA with stair navigation. Notified covering OT.   Pt seated in recliner with all needs within reach and chair alarm on at end of session.      Therapy Documentation Precautions:  Precautions Precautions: Sternal, Fall Precaution Booklet Issued: Yes (comment) Recall of Precautions/Restrictions: Intact Precaution/Restrictions Comments: good recall of precautions Restrictions Weight Bearing Restrictions Per Provider Order:  Yes RUE Weight Bearing Per Provider Order: Non weight bearing LUE Weight Bearing Per Provider Order: Non weight bearing RLE Weight Bearing Per Provider Order: Weight bearing as tolerated LLE Weight Bearing Per Provider Order: Weight bearing as tolerated Other Position/Activity Restrictions: sternal  Therapy/Group: Individual Therapy  [facility] [place], [place], DPT  09/03/2023, 8:02 AM

## 2023-09-03 NOTE — Progress Notes (Signed)
 Occupational Therapy Session Note  Patient Details  Name: Melissa Bray MRN: [id number] Date of Birth: May 11, 1960  Today's Date: 09/03/2023 OT Individual Time: 0930-1040 OT Individual Time Calculation (min): 70 min    Short Term Goals: Week 1:  OT Short Term Goal 1 (Week 1): The pt will tolerate > 30 minutes of OT activity with 1 to 2 rest breaks. OT Short Term Goal 2 (Week 1): The pt will complete a tub transfer with  ModI using a shower chair with extension with good safety awareness. OT Short Term Goal 3 (Week 1): The pt will complete LB bathing/dressing with ModI and full adherence to sternal precautions. OT Short Term Goal 4 (Week 1): The pt will complete simple home making task at ModI  with full adherence to sternal precaution and good safety measures.  Skilled Therapeutic Interventions/Progress Updates:    1:1 Pt received in the recliner and reported feeling discouraged about her situation but hopeful she can go home soon. Pt ambulated around the room with Rollator mod I (with therapist managing IV pole). Pt able to perform toileting mod I (sit to stand). Pt ambulated to sink and perform UB bathe and donned a dress mod I (supplies at counter). Pt donned shoes mod I - therapist did don TEDS. Pt ambulated from her room the main gym with supervision with Rollator and management of the IV pole. In the gym focus on dynamic balance without UE support. Pt performed toe taps on to 6 inch step for 1 min. After seated rest break performed 10 steps up onto the 6 inch step without UE support and then back down. After another seated rest break. Performed side stepping up and over the step sideways 2 times without UE support with contact guard for safety and for her security. After prolonged seated rest break. Pt ambulated around then gym and hallway twice ~ 178 feet without an AD with close supervision before asking to sit. Then ambulated back to her room without AD. Pt left in recliner. Pt declined at  this time transferred into a tub with and without AD. But did show her a picture of an option for a tub bench when reported she wanted to get a seat for the shower. Pt liked the safety of a tub bench (not having to step over the ledge). Pt reported she would get it on her own. Pt left sitting up in recliner.   Therapy Documentation Precautions:  Precautions Precautions: Sternal, Fall Precaution Booklet Issued: Yes (comment) Recall of Precautions/Restrictions: Intact Precaution/Restrictions Comments: good recall of precautions Restrictions Weight Bearing Restrictions Per Provider Order: Yes RUE Weight Bearing Per Provider Order: Non weight bearing LUE Weight Bearing Per Provider Order: Non weight bearing RLE Weight Bearing Per Provider Order: Weight bearing as tolerated LLE Weight Bearing Per Provider Order: Weight bearing as tolerated Other Position/Activity Restrictions: sternal General:   Vital Signs: Therapy Vitals Temp: 98.6 F (37 C) Temp Source: Oral Pulse Rate: 94 Resp: 16 BP: (!) 142/80 Patient Position (if appropriate): Sitting Oxygen  Therapy SpO2: 100 % O2 Device: Room Air Pain: No c/o pain in sessio n  Therapy/Group: Individual Therapy  Henrene Locust [facility] 09/03/2023, 2:37 PM

## 2023-09-04 ENCOUNTER — Other Ambulatory Visit (HOSPITAL_COMMUNITY): Payer: Self-pay

## 2023-09-04 ENCOUNTER — Ambulatory Visit: Admitting: Nurse Practitioner

## 2023-09-04 DIAGNOSIS — E119 Type 2 diabetes mellitus without complications: Secondary | ICD-10-CM | POA: Diagnosis not present

## 2023-09-04 DIAGNOSIS — I330 Acute and subacute infective endocarditis: Secondary | ICD-10-CM | POA: Diagnosis not present

## 2023-09-04 DIAGNOSIS — R5381 Other malaise: Secondary | ICD-10-CM | POA: Diagnosis not present

## 2023-09-04 DIAGNOSIS — I10 Essential (primary) hypertension: Secondary | ICD-10-CM | POA: Diagnosis not present

## 2023-09-04 LAB — GLUCOSE, CAPILLARY
Glucose-Capillary: 75 mg/dL (ref 70–99)
Glucose-Capillary: 148 mg/dL — ABNORMAL HIGH (ref 70–99)
Glucose-Capillary: 126 mg/dL — ABNORMAL HIGH (ref 70–99)
Glucose-Capillary: 111 mg/dL — ABNORMAL HIGH (ref 70–99)

## 2023-09-04 LAB — PROTIME-INR
INR: 3.5 — ABNORMAL HIGH (ref 0.8–1.2)
Prothrombin Time: 35.4 s — ABNORMAL HIGH (ref 11.4–15.2)

## 2023-09-04 MED ORDER — GLIPIZIDE ER 10 MG PO TB24
10.0000 mg | ORAL_TABLET | Freq: Every day | ORAL | 0 refills | Status: DC
  Filled 2023-09-04: qty 30, 30d supply, fill #0

## 2023-09-04 MED ORDER — CLONAZEPAM 0.5 MG PO TABS
0.5000 mg | ORAL_TABLET | Freq: Two times a day (BID) | ORAL | 0 refills | Status: DC | PRN
  Filled 2023-09-04: qty 30, 15d supply, fill #0

## 2023-09-04 MED ORDER — MELATONIN 5 MG PO TABS
5.0000 mg | ORAL_TABLET | Freq: Every evening | ORAL | 0 refills | Status: DC | PRN
  Filled 2023-09-04: qty 30, 30d supply, fill #0

## 2023-09-04 MED ORDER — CARVEDILOL 6.25 MG PO TABS
6.2500 mg | ORAL_TABLET | Freq: Two times a day (BID) | ORAL | 0 refills | Status: DC
  Filled 2023-09-04: qty 60, 30d supply, fill #0

## 2023-09-04 MED ORDER — OXYCODONE HCL 5 MG PO TABS
5.0000 mg | ORAL_TABLET | ORAL | 0 refills | Status: DC | PRN
  Filled 2023-09-04: qty 30, 2d supply, fill #0

## 2023-09-04 MED ORDER — PRAVASTATIN SODIUM 20 MG PO TABS
20.0000 mg | ORAL_TABLET | Freq: Every day | ORAL | 0 refills | Status: DC
  Filled 2023-09-04: qty 30, 30d supply, fill #0

## 2023-09-04 MED ORDER — WARFARIN SODIUM 5 MG PO TABS
5.0000 mg | ORAL_TABLET | Freq: Every day | ORAL | Status: AC
  Administered 2023-09-04: 5 mg via ORAL
  Filled 2023-09-04: qty 1

## 2023-09-04 MED ORDER — SPIRONOLACTONE 25 MG PO TABS
12.5000 mg | ORAL_TABLET | Freq: Every day | ORAL | 0 refills | Status: DC
  Filled 2023-09-04: qty 15, 30d supply, fill #0

## 2023-09-04 MED ORDER — PENICILLIN G POTASSIUM IV (FOR PTA / DISCHARGE USE ONLY): 24.0000 10*6.[IU] | INTRAVENOUS | 0 refills | Status: AC

## 2023-09-04 MED ORDER — AMIODARONE HCL 200 MG PO TABS
200.0000 mg | ORAL_TABLET | Freq: Every day | ORAL | 0 refills | Status: DC
  Filled 2023-09-04: qty 30, 30d supply, fill #0

## 2023-09-04 MED ORDER — PANTOPRAZOLE SODIUM 40 MG PO TBEC
40.0000 mg | DELAYED_RELEASE_TABLET | Freq: Every day | ORAL | 0 refills | Status: DC
Start: 2023-09-04 — End: 2023-10-16
  Filled 2023-09-04: qty 30, 30d supply, fill #0

## 2023-09-04 MED ORDER — GERHARDT'S BUTT CREAM
1.0000 | TOPICAL_CREAM | Freq: Every day | CUTANEOUS | 0 refills | Status: AC | PRN
  Filled 2023-09-04: qty 1, 1d supply, fill #0

## 2023-09-04 MED ORDER — TORSEMIDE 20 MG PO TABS
20.0000 mg | ORAL_TABLET | Freq: Every day | ORAL | 0 refills | Status: DC
Start: 2023-09-04 — End: 2023-09-29
  Filled 2023-09-04: qty 30, 30d supply, fill #0

## 2023-09-04 MED ORDER — METFORMIN HCL 850 MG PO TABS
850.0000 mg | ORAL_TABLET | Freq: Two times a day (BID) | ORAL | 0 refills | Status: DC
  Filled 2023-09-04: qty 60, 30d supply, fill #0

## 2023-09-04 MED ORDER — SACUBITRIL-VALSARTAN 49-51 MG PO TABS
1.0000 | ORAL_TABLET | Freq: Two times a day (BID) | ORAL | 0 refills | Status: DC
  Filled 2023-09-04: qty 60, 30d supply, fill #0

## 2023-09-04 MED ORDER — DAPAGLIFLOZIN PROPANEDIOL 10 MG PO TABS
10.0000 mg | ORAL_TABLET | Freq: Every day | ORAL | 0 refills | Status: DC
Start: 2023-09-04 — End: 2023-09-30
  Filled 2023-09-04: qty 30, 30d supply, fill #0

## 2023-09-04 MED ORDER — INSULIN GLARGINE 100 UNIT/ML SOLOSTAR PEN
20.0000 [IU] | PEN_INJECTOR | Freq: Two times a day (BID) | SUBCUTANEOUS | 11 refills | Status: DC
  Filled 2023-09-04: qty 12, 30d supply, fill #0

## 2023-09-04 NOTE — Progress Notes (Addendum)
 PROGRESS NOTE   Subjective/Complaints:  Pt in good spirits. Excited about getting out of here tomorrow! Sleeping well. No pain  ROS: Patient denies fever, rash, sore throat, blurred vision, dizziness, nausea, vomiting, diarrhea, cough, shortness of breath or chest pain, joint or back/neck pain, headache, or mood change.   Objective:   US  EKG Site Rite Result Date: 09/03/2023 If Site Rite image not attached, placement could not be confirmed due to current cardiac rhythm.  No results for input(s): "WBC", "HGB", "HCT", "PLT" in the last 72 hours.  No results for input(s): "NA", "K", "CL", "CO2", "GLUCOSE", "BUN", "CREATININE", "CALCIUM " in the last 72 hours.   Intake/Output Summary (Last 24 hours) at 09/04/2023 0940 Last data filed at 09/04/2023 0750 Gross per 24 hour  Intake 354 ml  Output --  Net 354 ml     Pressure Injury 08/27/23 Buttocks Other (Comment);Right Stage 2 -  Partial thickness loss of dermis presenting as a shallow open injury with a red, pink wound bed without slough. Small pink are stage 2 right outer buttocks (Active)  08/27/23 0010  Location: Buttocks  Location Orientation: Other (Comment);Right (outer)  Staging: Stage 2 -  Partial thickness loss of dermis presenting as a shallow open injury with a red, pink wound bed without slough.  Wound Description (Comments): Small pink are stage 2 right outer buttocks  Present on Admission: Yes    Physical Exam: Vital Signs Blood pressure (!) 157/117, pulse 91, temperature 98.3 F (36.8 C), temperature source Oral, resp. rate 16, height 5\' 5"  (1.651 m), weight 114.3 kg, SpO2 100%.  Constitutional: No distress . Vital signs reviewed. HEENT: NCAT, EOMI, oral membranes moist Neck: supple Cardiovascular: RRR without murmur. No JVD    Respiratory/Chest: CTA Bilaterally without wheezes or rales. Normal effort    GI/Abdomen: BS +, non-tender, non-distended Ext: no  clubbing, cyanosis, or edema Psych: pleasant and cooperative  Musculoskeletal:     Cervical back: Normal range of motion.     Right lower leg: Edema present. improved    Left lower leg: Edema present. improved Skin:    General: Skin is warm and dry.     Comments: Old left mastectomy scar present, chest incision healing and remains CDI Neurological:     Comments: Alert and oriented x 3. Normal insight and awareness. Intact Memory. Normal language and speech. Cranial nerve exam unremarkable. MMT: BUE motor 4+ to 5/5 prox to distal. BLE: 4 HF, KE and 4+ ADF/PF. Did not appreciate any focal sensory loss or abnl tone. DTR's 1+. No ataxia.    Neuro exam unchanged from the above on reexamination 09/04/23    Assessment/Plan: 1. Functional deficits which require 3+ hours per day of interdisciplinary therapy in a comprehensive inpatient rehab setting. Physiatrist is providing close team supervision and 24 hour management of active medical problems listed below. Physiatrist and rehab team continue to assess barriers to discharge/monitor patient progress toward functional and medical goals  Care Tool:  Bathing    Body parts bathed by patient: Right arm, Left arm, Chest, Abdomen, Front perineal area, Face, Left upper leg, Right upper leg, Left lower leg, Right lower leg     Body parts n/a:  Buttocks   Bathing assist Assist Level: Minimal Assistance - Patient > 75%     Upper Body Dressing/Undressing Upper body dressing   What is the patient wearing?: Dress    Upper body assist Assist Level: Independent with assistive device    Lower Body Dressing/Undressing Lower body dressing      What is the patient wearing?: Hospital gown only     Lower body assist Assist for lower body dressing: Minimal Assistance - Patient > 75% (Patient was able to simulate LB dressing using theraband with MinA)     Toileting Toileting    Toileting assist Assist for toileting: Independent with assistive  device     Transfers Chair/bed transfer  Transfers assist     Chair/bed transfer assist level: Independent with assistive device Chair/bed transfer assistive device:  (Rollator)   Locomotion Ambulation   Ambulation assist      Assist level: Independent with assistive device Assistive device: Rollator Max distance: 367 feet   Walk 10 feet activity   Assist     Assist level: Independent with assistive device Assistive device: Rollator   Walk 50 feet activity   Assist    Assist level: Independent with assistive device Assistive device: Rollator    Walk 150 feet activity   Assist    Assist level: Independent with assistive device Assistive device: Rollator    Walk 10 feet on uneven surface  activity   Assist     Assist level: Independent with assistive device Assistive device: Rollator   Wheelchair     Assist Is the patient using a wheelchair?: No             Wheelchair 50 feet with 2 turns activity    Assist            Wheelchair 150 feet activity     Assist          Blood pressure (!) 157/117, pulse 91, temperature 98.3 F (36.8 C), temperature source Oral, resp. rate 16, height 5\' 5"  (1.651 m), weight 114.3 kg, SpO2 100%.  Medical Problem List and Plan: 1. Functional deficits secondary to debility after bacterial endocarditis/status post mitral valve replacement 08/12/2023 with multiple associated complications.   -Sternal precautions             -patient may shower             -ELOS/Goals: 09/05/23, mod I to supervision goals.   -finalize dc planning. Needs follow up with primary, cards, CVTS. Doesn't need to see [facility]  2.  Antithrombotics: -DVT/anticoagulation:  Pharmaceutical: Coumadin .  INR range 2.5-3.5 -monitor labs daily, pharmacy protocol--INR 3/5 6/5             -antiplatelet therapy: N/A 3. Pain Management: Oxycodone  as needed, tramadol  4. Mood/Behavior/Sleep: Klonopin  0.5 mg twice daily as needed  anxiety, melatonin 5 mg nightly as needed             -antipsychotic agents: N/A 5. Neuropsych/cognition: This patient is capable of making decisions on her own behalf. 6. Skin/Wound Care: Routine skin checks 7. Fluids/Electrolytes/Nutrition: Routine in and outs with follow-up chemistries 8.  Acute blood loss anemia.  Follow-up CBC 9.  ID/bacterial endocarditis.  Penicillin  G initiated 08/13/2023.  Continue through 09/24/2023.   - weekly labs for monitoring - Follow-up with infectious disease as outpt --[facility] 09/29/23 @0930  -6/2  labs reviewed and stable  10.  Hypertension.  Coreg  3.125 mg twice daily, Entresto  24-26 mg twice daily, Aldactone  12.5 mg daily, Demadex  20  mg daily, Farxiga  10 mg daily  - 6/3 increased DBP over last 36 hours. Unclear of source. Will adjust coreg  to 6.25 mg bid and obsv (HR in 90's)  6/5 improved bp's until this morning's reading. Observe today before making any further changes.     09/04/2023    4:10 AM 09/03/2023    7:48 PM 09/03/2023    1:42 PM  Vitals with BMI  Weight 252 lbs    BMI 41.93    Systolic 157 135 161  Diastolic 117 80 80  Pulse 91 87 94    11.  Atrial flutter with RVR.  Status post DCCV 5/23.  Maintaining normal sinus rhythm.  Amiodarone  200 mg daily             -rate remains well controlled at present 12.  Diabetes mellitus.  Hemoglobin A1c 12.8.  Currently on NovoLog  3 units 3 times daily with meals, Semglee  25 units twice daily.  Prior to admission patient on Glucotrol  XL 10 mg daily, Lantus  insulin  20 units daily.   -reasonable control at present -Resume as needed -5-31: Tight control last night and this a.m., reduce Semglee  to 22 units twice daily--improved Recent Labs    09/03/23 1721 09/03/23 1951 09/04/23 0620  GLUCAP 124* 137* 75    6/5 cbg's more reasonable with reduction of semglee  to 20u daily. However, only 75u this am but 108 yesterday. Will order HS snack.   13.  History of breast cancer status post left mastectomy 2016.   Continue Arimidex  and follow-up outpatient 14.  Class III morbid obesity.  BMI 42.67.  Dietary follow-up    LOS: 6 days A FACE TO FACE EVALUATION WAS PERFORMED  Rawland Caddy 09/04/2023, 9:40 AM

## 2023-09-04 NOTE — Progress Notes (Signed)
 Physical Therapy Session Note  Patient Details  Name: Kellianne Ek MRN: [id number] Date of Birth: 19-Nov-1960  Today's Date: 09/04/2023 PT Individual Time: 1005-1100 PT Individual Time Calculation (min): 55 min   Short Term Goals: Week 1:  PT Short Term Goal 1 (Week 1): none d/t ELOS  Skilled Therapeutic Interventions/Progress Updates:     Pt received seated in recliner and agrees to therapy. No complaint of pain. Pt performs all mobility without assistance, at mod(I) level. Pt ambulates x300' to gym with rollator. PT provides cues for pursed lip breathing to optimize oxygen  sats and endurance. Pt rates perceived exertion at 7/10 following ambulation. Pt takes extended seated rest break. Pt performs x10 reps slow squats until buttocks touches mat then powering back up to standing, with arms across chest to limit strain through anterior chest and increase challenge for lower extremities and balance demand. Following extended seated rest break, pt completes additional x6 squats with cues for body mechanics and correct performance. Pt defers doing additional squats due to fatigue in thighs. Pt is agreeable to completing Nustep for endurance and strength training. Pt completes x10:00 at workload of 4 with average steps per minute ~50. PT provides cues for hand and foot placement and completing full available ROM. Following rest break, pt ambulates back to room with rollator at [facility]). Left seated with all needs within reach.  Therapy Documentation Precautions:  Precautions Precautions: Sternal, Fall Precaution Booklet Issued: Yes (comment) Recall of Precautions/Restrictions: Intact Precaution/Restrictions Comments: good recall of precautions Restrictions Weight Bearing Restrictions Per Provider Order: No RUE Weight Bearing Per Provider Order: Non weight bearing LUE Weight Bearing Per Provider Order: Non weight bearing RLE Weight Bearing Per Provider Order: Weight bearing as tolerated LLE Weight  Bearing Per Provider Order: Weight bearing as tolerated Other Position/Activity Restrictions: sternal   Therapy/Group: Individual Therapy  Neva Barban, PT, DPT 09/04/2023, 5:35 PM

## 2023-09-04 NOTE — Progress Notes (Signed)
 Physical Therapy Discharge Summary  Patient Details  Name: Melissa Bray MRN: [id number] Date of Birth: Apr 23, 1960  Date of Discharge from PT service:September 04, 2023  Today's Date: 09/04/2023 PT Individual Time: 0800-0857, 4098-1191 PT Individual Time Calculation (min): 57 min, 25 min    Patient has met 5 of 6 long term goals due to improved activity tolerance, improved balance, ability to compensate for deficits, and improved awareness.  Patient to discharge at an ambulatory level Modified Independent with assistance for stair navigation.   Patient's care partner is independent to provide the necessary physical assistance at discharge.  Reasons goals not met: Pt requires CGA for stair navigation with unilateral handrail and min A for stair navigation without handrails, pt limited to 8 steps at this time 2/2 endurance deficits   Recommendation:  Patient will benefit from ongoing skilled PT services in outpatient setting to continue to advance safe functional mobility, address ongoing impairments in endurance, balance, strength, gait, and minimize fall risk.  Equipment: rollator  Reasons for discharge: treatment goals met and discharge from hospital  Patient/family agrees with progress made and goals achieved: Yes  PT Discharge Precautions/Restrictions Precautions Precautions: Sternal;Fall Precaution Booklet Issued: Yes (comment) Recall of Precautions/Restrictions: Intact Precaution/Restrictions Comments: good recall of precautions Restrictions Weight Bearing Restrictions Per Provider Order: No Other Position/Activity Restrictions: sternal Pain Interference Pain Interference Pain Effect on Sleep: 1. Rarely or not at all Pain Interference with Therapy Activities: 1. Rarely or not at all Pain Interference with Day-to-Day Activities: 1. Rarely or not at all Vision/Perception  Vision - History Ability to See in Adequate Light: 0 Adequate Perception Perception: Within Functional  Limits Praxis Praxis: WFL  Cognition Overall Cognitive Status: Within Functional Limits for tasks assessed Arousal/Alertness: Awake/alert Orientation Level: Oriented X4 Memory: Appears intact Awareness: Appears intact Problem Solving: Appears intact Safety/Judgment: Appears intact Sensation Sensation Light Touch: Appears Intact Proprioception: Appears Intact Stereognosis: Appears Intact Coordination Gross Motor Movements are Fluid and Coordinated: No Coordination and Movement Description: gross weakness and sternal precautions Motor  Motor Motor: Within Functional Limits  Mobility Bed Mobility Bed Mobility: Rolling Right;Rolling Left;Supine to Sit;Sit to Supine Rolling Right: Independent with assistive device Rolling Left: Independent with assistive device Supine to Sit: Independent with assistive device Sit to Supine: Independent with assistive device Transfers Transfers: Sit to Stand;Stand to Sit;Stand Pivot Transfers Sit to Stand: Independent with assistive device Stand to Sit: Independent with assistive device Stand Pivot Transfers: Independent with assistive device Transfer (Assistive device): Rollator Locomotion  Gait Ambulation: Yes Gait Assistance: Independent with assistive device Gait Distance (Feet): 681 Feet Assistive device: Rollator Gait Gait: Yes Gait Pattern: Impaired Gait Pattern: Wide base of support Gait velocity: decreased Stairs / Additional Locomotion Stairs: Yes Stairs Assistance: Contact Guard/Touching assist;Minimal Assistance - Patient > 75% Stair Management Technique: No rails;One rail Right Number of Stairs: 8 Height of Stairs: 6 Ramp: Independent with assistive device Wheelchair Mobility Wheelchair Mobility: No  Trunk/Postural Assessment  Cervical Assessment Cervical Assessment: Within Functional Limits Thoracic Assessment Thoracic Assessment: Exceptions to [facility] (sternal precautions) Lumbar Assessment Lumbar Assessment: Within  Functional Limits Postural Control Postural Control: Within Functional Limits  Balance Balance Balance Assessed: Yes Static Sitting Balance Static Sitting - Balance Support: Feet supported Static Sitting - Level of Assistance: 6: Modified independent (Device/Increase time) Dynamic Sitting Balance Dynamic Sitting - Balance Support: Feet supported Dynamic Sitting - Level of Assistance: 6: Modified independent (Device/Increase time) Static Standing Balance Static Standing - Balance Support: Bilateral upper extremity supported Static Standing - Level of Assistance:  6: Modified independent (Device/Increase time) Dynamic Standing Balance Dynamic Standing - Balance Support: During functional activity;Bilateral upper extremity supported Dynamic Standing - Level of Assistance: 6: Modified independent (Device/Increase time) Extremity Assessment  RLE Assessment RLE Assessment: Exceptions to [facility] General Strength Comments: grossly 4/5 LLE Assessment LLE Assessment: Exceptions to [facility] General Strength Comments: grossly 4/5  Today's Interventions  Treatment Session 1  Pt seated in recliner upon arrival. Pt agreeable to therapy. Pt denies any pain.   Pt confirms she would prefer rollator versus RW. Notified Child psychotherapist via Fish farm manager.   Nurse present to start IV antibiotics. Therapist managing IV throughout session.   Pt denies any questions/concerns regarding discharge home.   Pt reports plan to purchase reacher for home.    6 Min Walk Test:  Instructed patient to ambulate as quickly and as safely as possible for 6 minutes using LRAD. Patient was allowed to take standing rest breaks without stopping the test, but if the patient required a sitting rest break the clock would be stopped and the test would be over.  Results: 527 feet (160 meters) using rollator and mod I--pt able to continue walking after 6 minutes for total of 681 feet.   Results indicate that the patient has reduced  endurance with ambulation compared to age matched norms.  Age Matched Norms: 37-69 yo M: 73 F: 16, 43-79 yo M: 1 F: 471, 39-89 yo M: 417 F: 392 MDC: 58.21 meters (190.98 feet) or 50 meters (ANPTA Core Set of Outcome Measures for Adults with Neurologic Conditions, 2018)    Reviewed and performed the following HEP, handout provided.   Access Code: [email or web address] URL: [email or web address] Date: 09/04/2023 Prepared by: Jenney Modest  Exercises - Sit to Stand with Arms Crossed  - 1 x daily - 7 x weekly - 3 sets - 5 reps - Standing March with Counter Support  - 1 x daily - 7 x weekly - 1 sets - 10 reps - Standing Hip Abduction with Counter Support  - 1 x daily - 7 x weekly - 1 sets - 10 reps - Standing Hip Extension with Counter Support  - 1 x daily - 7 x weekly - 1 sets - 10 reps  Pt seated in recliner with all needs within reach  Treatment Session 2  Pt received from OT. Pt agreeable to therapy. Pt denies any pain.   Pt managing IV throughout session.   Pt ambulated day room to room with rollator and mod I.   Pt standard rollator delivered to pt room. Pt adjusted to pt height. Pt ambulated and performed seated transfer on rollator with mod I. Pt performed ambulatry transfer to toilet with mod I. Pt continent of bladder.  Pt seated in recliner at end of session with all needs within reach.    [facility] [place], [place], DPT  09/04/2023, 7:45 AM

## 2023-09-04 NOTE — Progress Notes (Signed)
      [address]       Melissa Bray [id number]             [phone]      Patient is sitting up in chair states they are working her good. She reports she went to a dance class today and enjoys it, plans are to go home tomorrow. Removed old dressing and incisions look clean with good granulation tissue. They are also decreasing in size. I changed her wet to dry dressing and we reviewed continuing dressing changes daily at home. We reviewed that she should remove the dressings prior to showering and wash the incisions with soap and water. And then replace with vashe wet to dry dressings. I have made a wound check appointment in our office on 06/17 at 1:30. Patient to call the office with any questions or difficulties.  Randa Burton, PA-C

## 2023-09-04 NOTE — Progress Notes (Signed)
 PHARMACY CONSULT NOTE FOR:  OUTPATIENT  PARENTERAL ANTIBIOTIC THERAPY (OPAT)  Indication: GBS MV IE Regimen: Penicillin  G 24 million units daily as a continuous infusion End date: 09/24/23  IV antibiotic discharge orders are pended. To discharging provider:  please sign these orders via discharge navigator,  Select New Orders & click on the button choice - Manage This Unsigned Work.     Thank you for allowing pharmacy to be a part of this patient's care.  Garland Junk, PharmD, BCPS, BCIDP Infectious Diseases Clinical Pharmacist 09/04/2023 10:35 AM   **Pharmacist phone directory can now be found on [email or web address] ([email or web address]).  Listed under [facility].

## 2023-09-04 NOTE — Progress Notes (Signed)
 Inpatient Rehabilitation Care Coordinator Discharge Note   Patient Details  Name: Melissa Bray MRN: [id number] Date of Birth: [birth year]   Discharge location: HOME WITH FAMILY WHO CAN BE THERE  Length of Stay: 7 DAYS  Discharge activity level: MOD/I LEVEL  Home/community participation: ACTIVE  Patient response YQ:MVHQIO Literacy - How often do you need to have someone help you when you read instructions, pamphlets, or other written material from your doctor or pharmacy?: Never  Patient response NG:EXBMWU Isolation - How often do you feel lonely or isolated from those around you?: Never  Services provided included: MD, RD, PT, OT, RN, CM, TR, Pharmacy, SW  Financial Services:  Field seismologist Utilized: Barrister's clerk  Choices offered to/list presented to: PT  Follow-up services arranged:  DME, Home Health, Patient/Family has no preference for HH/DME agencies Home Health Agency: [facility]    DME : [facility] RO[facility]    Patient response to transportation need: Is the patient able to respond to transportation needs?: Yes In the past 12 months, has lack of transportation kept you from medical appointments or from getting medications?: No In the past 12 months, has lack of transportation kept you from meetings, work, or from getting things needed for daily living?: No   Patient/Family verbalized understanding of follow-up arrangements:  Yes  Individual responsible for coordination of the follow-up plan: SELF [phone]  Confirmed correct DME delivered: Mardell Shade 09/04/2023    Comments (or additional information):PT DID WELL AND REACHED GOALS QUICKLY. COULD NOT FIND A HH TO ACCEPT HER REFERRAL DUE TO INSURANCE SO [facility] WILL PROVIDE RN FOR INFUSION. NEPHEW HERE TO EDUCATE ON STEPS AT HOME  Summary of Stay    Date/Time Discharge Planning CSW  09/03/23 1000 Home with fmaily members trying to ifnd a HH agnecy to accept her BCBS. Will  need IV antibitoics at home and follow up RGD       Jamil Castillo, Maralee Senate

## 2023-09-04 NOTE — Progress Notes (Signed)
 PHARMACY - ANTICOAGULATION CONSULT NOTE  Pharmacy Consult for warfarin Indication: onX mMVR  No Known Allergies  Patient Measurements: Height: 5\' 5"  (165.1 cm) Weight: 114.3 kg (251 lb 15.8 oz) IBW/kg (Calculated) : 57 HEPARIN  DW (KG): 84.4  Vital Signs: Temp: 98.5 Bray (36.9 C) (06/05 1332) Temp Source: Oral (06/05 1332) BP: 157/82 (06/05 1332) Pulse Rate: 91 (06/05 1332)  Labs: Recent Labs    09/02/23 0417 09/03/23 0330 09/04/23 0502  LABPROT 34.2* 36.0* 35.4*  INR 3.3* 3.6* 3.5*    Estimated Creatinine Clearance: 70.3 mL/min (A) (by C-G formula based on SCr of 1.02 mg/dL (H)).   Medical History: Past Medical History:  Diagnosis Date   Anxiety    Breast cancer (HCC)    Diabetes mellitus without complication (HCC)    Hyperlipidemia    Hypertension    Type 2 diabetes mellitus without retinopathy (HCC) 06/17/2017    Medications:  Scheduled:   amiodarone   200 mg Oral Daily   anastrozole   1 mg Oral Daily   carvedilol   6.25 mg Oral BID WC   Chlorhexidine  Gluconate Cloth  6 each Topical Q12H   dapagliflozin  propanediol  10 mg Oral Daily   feeding supplement  237 mL Oral BID BM   Gerhardt's butt cream   Topical Daily   insulin  aspart  0-15 Units Subcutaneous TID WC   insulin  aspart  3 Units Subcutaneous TID WC   insulin  glargine-yfgn  20 Units Subcutaneous BID   pantoprazole   40 mg Oral Daily   sacubitril -valsartan   1 tablet Oral BID   sodium chloride  flush  10-40 mL Intracatheter Q12H   spironolactone   12.5 mg Oral Daily   torsemide   20 mg Oral Daily   Warfarin - Pharmacist Dosing Inpatient   Does not apply q1600    Assessment: 63 yo Bray presenting with acute mitral valve endocarditis with perforated mitral leaflet and severe MR. Underwent dental extraction 5/8. Presented for pre-op RHC and developed flash pulmonary edema requiring intubation - IABP was placed in lab and IV heparin  started. No AC PTA.  Pt now s/p cardiac surgery with onX mMVR placed. Pharmacy  consulted to start warfarin 5/16 after IABP removal 5/15. S/p DCCV 5/23 and maintaining NSR. Heparin  bridge dc'd once INR > 2. Amiodarone  started on 5/Melissa  Patient has transferred to inpatient rehab.  Pharmacy continuing with warfarin dosing.  Warfarin doses have been variable with an elevated INR and PRBCs x 1 on 5/22.   UPDATE:   INR 3.5 on 09/04/23, therapeutic  (Goal INR 2.5-3.5). INR has been variable.   Will attempt to establish stable regimen for discharge.  Goal of Therapy:  INR 2.5-3.5 Monitor platelets by anticoagulation protocol: Yes   Plan:  Warfarin 5mg  daily Monitor daily INR, CBC, clinical course, s/sx of bleed, PO intake/diet, Drug-Drug Interactions Based on last week - Warfarin 5mg  daily may be the right regimen to keep at goal.   Thank you for allowing pharmacy to be a part of this patient's care.   Alisa Irish, RPh Clinical Pharmacist Please refer to AMION for [facility] Pharmacy numbers 09/04/2023 2:55 PM  **Pharmacist phone directory can be found on [email or web address] listed under [facility]**

## 2023-09-04 NOTE — Progress Notes (Signed)
 Occupational Therapy Discharge Summary  Patient Details  Name: Melissa Bray MRN: [id number] Date of Birth: Jun 27, 1960  Date of Discharge from OT service:Aug 04, 2023  Today's Date: 09/04/2023 OT Individual Time: 1351-1430 OT Individual Time Calculation (min): 39 min    Patient has met 10 of 10 long term goals due to improved activity tolerance, improved balance, and ability to compensate for deficits.  Patient to discharge at overall Modified Independent level.  Patient's care partner is independent to provide the necessary physical assistance at discharge.    Reasons goals not met: All goals met   Recommendation:  Further OT services not recommended at this time as Pt is at her baseline for ADLs.   Equipment: Pt plans to buy a TTB independently   Reasons for discharge: treatment goals met and discharge from hospital  Patient/family agrees with progress made and goals achieved: Yes  OT Discharge Skilled Therapeutic Interventions/Progress Updates:  Pt received sitting up in recliner, dressed and ready for the day upon OT arrival. Pt presenting to be in good spirits receptive to skilled OT session reporting 0/10 pain- OT offering intermittent rest breaks, repositioning, and therapeutic support to optimize participation in therapy session. Spent portion of time completing grad day activities at beginning of session with reassessment of Pt's ADL status, vision, cognition, balance, sensation, strength, and coordination (see below documentation). Remainder of session focused on d/c planning, Pt education, and activity tolerance to increase overall independence in IADLs. Provided education on simple home modifications to increase Pt's independence/safety in IADLs and provided education on AE that could be used to assist her in maintaining her sternal precautions within her home. Pt reporting she plans to purchase a reacher to use at d/c and able to identify >5 scenarios when it would be helpful in  the home. Engaged Pt in completing functional mobility training through hallways to simulate navigating in home or community environments with Pt able to tolerate completing >81ft x4 trials with seated rest breaks provided between each trials. Pt with improved activity tolerance this session. Pt reporting she feels prepared for d/c. Pt was left in therapy gym under supervision of therapy staff with all needs met.  Precautions/Restrictions  Precautions Precautions: Sternal;Fall Precaution Booklet Issued: Yes (comment) Recall of Precautions/Restrictions: Intact Precaution/Restrictions Comments: good recall of precautions Restrictions Other Position/Activity Restrictions: sternal Vital Signs Therapy Vitals Temp: 98.5 F (36.9 C) Temp Source: Oral Pulse Rate: 91 Resp: 18 BP: (!) 157/82 Patient Position (if appropriate): Sitting Oxygen  Therapy SpO2: 99 % O2 Device: Room Air Pain Pain Assessment Pain Scale: 0-10 Pain Score: 0-No pain ADL ADL Equipment Provided: Other (comment) (RW) Eating: Independent Where Assessed-Eating: Chair Grooming: Modified independent Where Assessed-Grooming: Sitting at sink Upper Body Bathing: Modified independent Where Assessed-Upper Body Bathing: Chair Lower Body Bathing: Modified independent Where Assessed-Lower Body Bathing: Chair Upper Body Dressing: Modified independent (Device) Where Assessed-Upper Body Dressing: Chair Lower Body Dressing: Modified independent Where Assessed-Lower Body Dressing: Chair Toileting: Modified independent Where Assessed-Toileting: Teacher, adult education: Modified Community education officer Method: Proofreader: Other (comment) (arms across chest for sternal precautions able ot come from sit to stand using momentum) Tub/Shower Transfer: Modified independent Tub/Shower Transfer Method: Ship broker: Insurance underwriter: Modified independent Lexicographer Method: Designer, industrial/product: Sales promotion account executive Baseline Vision/History: 1 Wears glasses (wear glasses) Patient Visual Report: No change from baseline Vision Assessment?: Wears glasses for reading;No apparent visual deficits Perception  Perception: Within Functional Limits Praxis Praxis: [facility]  Cognition Cognition Overall Cognitive Status: Within Functional Limits for tasks assessed Arousal/Alertness: Awake/alert Orientation Level: Person;Place;Situation Person: Oriented Place: Oriented Situation: Oriented Memory: Appears intact Awareness: Appears intact Problem Solving: Appears intact Safety/Judgment: Appears intact Brief Interview for Mental Status (BIMS) Repetition of Three Words (First Attempt): 3 Temporal Orientation: Year: Correct Temporal Orientation: Month: Accurate within 5 days Temporal Orientation: Day: Correct Recall: "Sock": Yes, no cue required Recall: "Blue": Yes, no cue required Recall: "Bed": Yes, no cue required BIMS Summary Score: 15 Sensation Sensation Light Touch: Appears Intact Proprioception: Appears Intact Stereognosis: Appears Intact Coordination Gross Motor Movements are Fluid and Coordinated: No Fine Motor Movements are Fluid and Coordinated: Yes Coordination and Movement Description: gross weakness and sternal precautions Finger Nose Finger Test: smooth equal movements bilaterally Motor  Motor Motor: Within Functional Limits Mobility  Bed Mobility Bed Mobility: Rolling Right;Rolling Left;Supine to Sit;Sit to Supine Rolling Right: Independent with assistive device Rolling Left: Independent with assistive device Supine to Sit: Independent with assistive device Sit to Supine: Independent with assistive device Transfers Sit to Stand: Independent with assistive device Stand to Sit: Independent with assistive device  Trunk/Postural Assessment  Cervical Assessment Cervical Assessment: Within Functional  Limits Thoracic Assessment Thoracic Assessment: Exceptions to [facility] (sternal precuations) Lumbar Assessment Lumbar Assessment: Within Functional Limits Postural Control Postural Control: Within Functional Limits  Balance Balance Balance Assessed: Yes Static Sitting Balance Static Sitting - Balance Support: Feet supported Static Sitting - Level of Assistance: 6: Modified independent (Device/Increase time) Dynamic Sitting Balance Dynamic Sitting - Balance Support: Feet supported Dynamic Sitting - Level of Assistance: 6: Modified independent (Device/Increase time) Static Standing Balance Static Standing - Balance Support: Bilateral upper extremity supported Static Standing - Level of Assistance: 6: Modified independent (Device/Increase time) Dynamic Standing Balance Dynamic Standing - Balance Support: During functional activity;Bilateral upper extremity supported Dynamic Standing - Level of Assistance: 6: Modified independent (Device/Increase time) Extremity/Trunk Assessment RUE Assessment RUE Assessment: Within Functional Limits Passive Range of Motion (PROM) Comments: WFL (within sternal precuations) General Strength Comments: 3/5MMT LUE Assessment LUE Assessment: Within Functional Limits Passive Range of Motion (PROM) Comments: WFL (within sternal precuations) General Strength Comments: 3/5   Billey Budds Woodson 09/04/2023, 2:07 PM

## 2023-09-04 NOTE — Plan of Care (Signed)
  Problem: RH Stairs Goal: LTG Patient will ambulate up and down stairs w/assist (PT) Description: LTG: Patient will ambulate up and down # of stairs with assistance (PT) Outcome: Adequate for Discharge   Problem: RH Balance Goal: LTG Patient will maintain dynamic standing balance (PT) Description: LTG:  Patient will maintain dynamic standing balance with assistance during mobility activities (PT) Outcome: Completed/Met   Problem: Sit to Stand Goal: LTG:  Patient will perform sit to stand with assistance level (PT) Description: LTG:  Patient will perform sit to stand with assistance level (PT) Outcome: Completed/Met   Problem: RH Bed Mobility Goal: LTG Patient will perform bed mobility with assist (PT) Description: LTG: Patient will perform bed mobility with assistance, with/without cues (PT). Outcome: Completed/Met   Problem: RH Bed to Chair Transfers Goal: LTG Patient will perform bed/chair transfers w/assist (PT) Description: LTG: Patient will perform bed to chair transfers with assistance (PT). Outcome: Completed/Met   Problem: RH Ambulation Goal: LTG Patient will ambulate in controlled environment (PT) Description: LTG: Patient will ambulate in a controlled environment, # of feet with assistance (PT). Outcome: Completed/Met

## 2023-09-04 NOTE — Progress Notes (Signed)
 Patient ID: Melissa Bray, female   DOB: 01/02/1961, 63 y.o.   MRN: [id number]  Unable to find a home health agency to accept Melissa Bray referral. [facility] can provide RN for IV infusion and pt to learn wound care today. She is agreeable to this and aware changed order for equipment to rollator since changed Melissa Bray mind this am. Feels ready for discharge tomorrow

## 2023-09-04 NOTE — Plan of Care (Signed)
 Assumed care at 1900. Pt is Aox4. Pt has been resting comfortably in bed overnight and has repositioned to chair as well. Pt is ambulating in room with assistance using walker. No c/o pain, see MAR. Pt is compliant with sternal precautions. No significant events overnight. Pt looks forward to getting discharged Friday.    Problem: RH BLADDER ELIMINATION Goal: RH STG MANAGE BLADDER WITH ASSISTANCE Description: STG Manage Bladder With min Assistance Outcome: Progressing   Problem: RH SAFETY Goal: RH STG ADHERE TO SAFETY PRECAUTIONS W/ASSISTANCE/DEVICE Description: STG Adhere to Safety Precautions With min Assistance/Device. Outcome: Progressing   Problem: RH PAIN MANAGEMENT Goal: RH STG PAIN MANAGED AT OR BELOW PT'S PAIN GOAL Description: <4 w/ prns Outcome: Progressing   Problem: RH KNOWLEDGE DEFICIT GENERAL Goal: RH STG INCREASE KNOWLEDGE OF SELF CARE AFTER HOSPITALIZATION Description: Manage increase knowledge of self care after hospitalization with min assistance from son using educational materials provided Outcome: Progressing

## 2023-09-04 NOTE — Plan of Care (Signed)
  Problem: RH Balance Goal: LTG Patient will maintain dynamic standing with ADLs (OT) Description: LTG:  Patient will maintain dynamic standing balance with assist during activities of daily living (OT)  Outcome: Completed/Met   Problem: Sit to Stand Goal: LTG:  Patient will perform sit to stand in prep for activites of daily living with assistance level (OT) Description: LTG:  Patient will perform sit to stand in prep for activites of daily living with assistance level (OT) Outcome: Completed/Met   Problem: RH Bathing Goal: LTG Patient will bathe all body parts with assist levels (OT) Description: LTG: Patient will bathe all body parts with assist levels (OT) Outcome: Completed/Met   Problem: RH Dressing Goal: LTG Patient will perform upper body dressing (OT) Description: LTG Patient will perform upper body dressing with assist, with/without cues (OT). Outcome: Completed/Met Goal: LTG Patient will perform lower body dressing w/assist (OT) Description: LTG: Patient will perform lower body dressing with assist, with/without cues in positioning using equipment (OT) Outcome: Completed/Met   Problem: RH Toileting Goal: LTG Patient will perform toileting task (3/3 steps) with assistance level (OT) Description: LTG: Patient will perform toileting task (3/3 steps) with assistance level (OT)  Outcome: Completed/Met   Problem: RH Simple Meal Prep Goal: LTG Patient will perform simple meal prep w/assist (OT) Description: LTG: Patient will perform simple meal prep with assistance, with/without cues (OT). Outcome: Completed/Met   Problem: RH Laundry Goal: LTG Patient will perform laundry w/assist, cues (OT) Description: LTG: Patient will perform laundry with assistance, with/without cues (OT). Outcome: Completed/Met   Problem: RH Toilet Transfers Goal: LTG Patient will perform toilet transfers w/assist (OT) Description: LTG: Patient will perform toilet transfers with assist, with/without  cues using equipment (OT) Outcome: Completed/Met   Problem: RH Tub/Shower Transfers Goal: LTG Patient will perform tub/shower transfers w/assist (OT) Description: LTG: Patient will perform tub/shower transfers with assist, with/without cues using equipment (OT) Outcome: Completed/Met

## 2023-09-05 ENCOUNTER — Other Ambulatory Visit (HOSPITAL_COMMUNITY): Payer: Self-pay

## 2023-09-05 DIAGNOSIS — I059 Rheumatic mitral valve disease, unspecified: Secondary | ICD-10-CM | POA: Diagnosis not present

## 2023-09-05 LAB — PROTIME-INR
INR: 3.7 — ABNORMAL HIGH (ref 0.8–1.2)
Prothrombin Time: 36.9 s — ABNORMAL HIGH (ref 11.4–15.2)

## 2023-09-05 LAB — GLUCOSE, CAPILLARY: Glucose-Capillary: 115 mg/dL — ABNORMAL HIGH (ref 70–99)

## 2023-09-05 MED ORDER — INSULIN PEN NEEDLE 31G X 8 MM MISC
0 refills | Status: AC
  Filled 2023-09-05: qty 100, 30d supply, fill #0

## 2023-09-05 MED ORDER — WARFARIN SODIUM 2 MG PO TABS: 2.0000 mg | ORAL_TABLET | Freq: Once | ORAL | Status: DC

## 2023-09-05 MED ORDER — WARFARIN SODIUM 4 MG PO TABS: 4.0000 mg | ORAL_TABLET | Freq: Every day | ORAL | Status: DC

## 2023-09-05 MED ORDER — WARFARIN SODIUM 4 MG PO TABS
4.0000 mg | ORAL_TABLET | Freq: Every day | ORAL | 0 refills | Status: DC
  Filled 2023-09-05: qty 30, 30d supply, fill #0

## 2023-09-05 MED ORDER — WARFARIN SODIUM 2.5 MG PO TABS: 2.5000 mg | ORAL_TABLET | ORAL | Status: DC

## 2023-09-05 MED ORDER — WARFARIN SODIUM 5 MG PO TABS: 5.0000 mg | ORAL_TABLET | ORAL | Status: DC

## 2023-09-05 MED ORDER — WARFARIN SODIUM 2.5 MG PO TABS
ORAL_TABLET | ORAL | 0 refills | Status: DC
Start: 2023-09-05 — End: 2023-09-05
  Filled 2023-09-05: qty 30, fill #0

## 2023-09-05 MED ORDER — WARFARIN SODIUM 5 MG PO TABS: 5.0000 mg | ORAL_TABLET | Freq: Every day | ORAL | Status: DC

## 2023-09-05 MED ORDER — WARFARIN SODIUM 5 MG PO TABS
ORAL_TABLET | ORAL | 0 refills | Status: DC
Start: 2023-09-05 — End: 2023-09-30
  Filled 2023-09-05: qty 30, 30d supply, fill #0

## 2023-09-05 MED ORDER — HYDRALAZINE HCL 25 MG PO TABS: 25.0000 mg | ORAL_TABLET | Freq: Four times a day (QID) | ORAL | Status: DC | PRN

## 2023-09-05 MED ORDER — OXYCODONE HCL 5 MG PO TABS
5.0000 mg | ORAL_TABLET | ORAL | 0 refills | Status: DC | PRN
  Filled 2023-09-05: qty 30, 2d supply, fill #0

## 2023-09-05 NOTE — Progress Notes (Signed)
 Inpatient Rehabilitation Discharge Medication Review by a Pharmacist   A complete drug regimen review was completed for this patient to identify any potential clinically significant medication issues.   High Risk Drug Classes Is patient taking? Indication by Medication  Antipsychotic No    Anticoagulant Yes Warfarin - mechanical valve  Antibiotic Yes, as an intravenous medication Penicillin  - endocarditis  Opioid Yes Oxycodone  prn severe pain   Antiplatelet No    Hypoglycemics/insulin  Yes Insulin  glagrine, glipizide , metformin  - DM Dapaglifozin - DM, CHF  Vasoactive Medication Yes Amiodarone  - Afib Carvedilol , spironolactone , torsemide , entresto - CHF, BP  Chemotherapy Yes, Oral Chemotherapy Anastrozole  - breast cancer  Other Yes Clonazepam  - anxiety Pantoprazole  - reflux  Pravastatin - HLD Alendronate  - osteoporosis Melatonin - sleep Nystatin cream- skin protectant/antifungal        Type of Medication Issue Identified Description of Issue Recommendation(s)  Drug Interaction(s) (clinically significant)        Duplicate Therapy        Allergy        No Medication Administration End Date        Incorrect Dose        Additional Drug Therapy Needed        Significant med changes from prior encounter (inform family/care partners about these prior to discharge).      Other            Clinically significant medication issues were identified that warrant physician communication and completion of prescribed/recommended actions by midnight of the next day:  No     Time spent performing this drug regimen review (minutes):  30 minutes    Alisa Irish, RPh Clinical Pharmacist 09/05/2023 8:03 AM

## 2023-09-05 NOTE — Progress Notes (Signed)
 PROGRESS NOTE   Subjective/Complaints:   No acute complaints.  No events overnight. BP remains elevated, 150s overnight, on a.m. evaluation was in the 180s over 110s.  Patient denies any symptoms, but is currently working on home health/IV management with the nurse and is somewhat anxious.  On repeat later, blood pressures come back down.    ROS: Patient denies fever, rash, sore throat, blurred vision, dizziness, nausea, vomiting, diarrhea, cough, shortness of breath or chest pain, joint or back/neck pain, headache, or mood change.   Objective:   US  EKG Site Rite Result Date: 09/03/2023 If Site Rite image not attached, placement could not be confirmed due to current cardiac rhythm.  No results for input(s): "WBC", "HGB", "HCT", "PLT" in the last 72 hours.  No results for input(s): "NA", "K", "CL", "CO2", "GLUCOSE", "BUN", "CREATININE", "CALCIUM " in the last 72 hours.   Intake/Output Summary (Last 24 hours) at 09/05/2023 0920 Last data filed at 09/05/2023 0900 Gross per 24 hour  Intake 3336.01 ml  Output --  Net 3336.01 ml     Pressure Injury 08/27/23 Buttocks Other (Comment);Right Stage 2 -  Partial thickness loss of dermis presenting as a shallow open injury with a red, pink wound bed without slough. Small pink are stage 2 right outer buttocks (Active)  08/27/23 0010  Location: Buttocks  Location Orientation: Other (Comment);Right (outer)  Staging: Stage 2 -  Partial thickness loss of dermis presenting as a shallow open injury with a red, pink wound bed without slough.  Wound Description (Comments): Small pink are stage 2 right outer buttocks  Present on Admission: Yes    Physical Exam: Vital Signs Blood pressure (!) 154/95, pulse 88, temperature 98.4 F (36.9 C), temperature source Oral, resp. rate 18, height 5\' 5"  (1.651 m), weight 114.7 kg, SpO2 98%.  Constitutional: No distress . Vital signs reviewed.  Sitting up in  bedside chair. HEENT: NCAT, EOMI, oral membranes moist Neck: supple Cardiovascular: RRR without murmur. No JVD    Respiratory/Chest: CTA Bilaterally without wheezes or rales. Normal effort    GI/Abdomen: BS +, non-tender, non-distended Ext: no clubbing, cyanosis, or edema Psych: pleasant and cooperative  Musculoskeletal:     Cervical back: Normal range of motion.     Right lower leg: Edema present. improved    Left lower leg: Edema present. improved Skin:    General: Skin is warm and dry.     Comments: Old left mastectomy scar present, chest incision healing and remains CDI Neurological:     Comments: Alert and oriented x 3. Normal insight and awareness. Intact Memory. Normal language and speech. Cranial nerve exam unremarkable. MMT: 5- out of 5 in bilateral upper and lower extremities.. Did not appreciate any focal sensory loss or abnl tone. DTR's 1+. No ataxia.     Neuro exam unchanged from the above on reexamination 09/05/23    Assessment/Plan: 1. Functional deficits which require 3+ hours per day of interdisciplinary therapy in a comprehensive inpatient rehab setting. Physiatrist is providing close team supervision and 24 hour management of active medical problems listed below. Physiatrist and rehab team continue to assess barriers to discharge/monitor patient progress toward functional and medical goals  Care  Tool:  Bathing    Body parts bathed by patient: Right arm, Left arm, Chest, Abdomen, Front perineal area, Face, Left upper leg, Right upper leg, Left lower leg, Right lower leg, Buttocks     Body parts n/a: Buttocks   Bathing assist Assist Level: Independent with assistive device     Upper Body Dressing/Undressing Upper body dressing   What is the patient wearing?: Dress    Upper body assist Assist Level: Independent with assistive device    Lower Body Dressing/Undressing Lower body dressing      What is the patient wearing?: Underwear/pull up     Lower  body assist Assist for lower body dressing: Independent with assitive device     Toileting Toileting    Toileting assist Assist for toileting: Independent with assistive device     Transfers Chair/bed transfer  Transfers assist     Chair/bed transfer assist level: Independent with assistive device Chair/bed transfer assistive device:  (Rollator)   Locomotion Ambulation   Ambulation assist      Assist level: Independent with assistive device Assistive device: Rollator Max distance: 367 feet   Walk 10 feet activity   Assist     Assist level: Independent with assistive device Assistive device: Rollator   Walk 50 feet activity   Assist    Assist level: Independent with assistive device Assistive device: Rollator    Walk 150 feet activity   Assist    Assist level: Independent with assistive device Assistive device: Rollator    Walk 10 feet on uneven surface  activity   Assist     Assist level: Independent with assistive device Assistive device: Rollator   Wheelchair     Assist Is the patient using a wheelchair?: No             Wheelchair 50 feet with 2 turns activity    Assist            Wheelchair 150 feet activity     Assist          Blood pressure (!) 154/95, pulse 88, temperature 98.4 F (36.9 C), temperature source Oral, resp. rate 18, height 5\' 5"  (1.651 m), weight 114.7 kg, SpO2 98%.  Medical Problem List and Plan: 1. Functional deficits secondary to debility after bacterial endocarditis/status post mitral valve replacement 08/12/2023 with multiple associated complications.   -Sternal precautions             -patient may shower             -ELOS/Goals: 09/05/23, mod I to supervision goals.   -finalize dc planning. Needs follow up with primary, cards, CVTS. Doesn't need to see [facility]  The patient is medically ready for discharge to home and will not need follow-up with [facility] PM&R. In addition, they will need to  follow up with their PCP, cardiology, and CVT Surgery.    2.  Antithrombotics: -DVT/anticoagulation:  Pharmaceutical: Coumadin .  INR range 2.5-3.5 -monitor labs daily, pharmacy protocol--INR 3/5 6/5             -antiplatelet therapy: N/A 3. Pain Management: Oxycodone  as needed, tramadol  4. Mood/Behavior/Sleep: Klonopin  0.5 mg twice daily as needed anxiety, melatonin 5 mg nightly as needed             -antipsychotic agents: N/A 5. Neuropsych/cognition: This patient is capable of making decisions on her own behalf. 6. Skin/Wound Care: Routine skin checks 7. Fluids/Electrolytes/Nutrition: Routine in and outs with follow-up chemistries 8.  Acute blood loss anemia.  Follow-up CBC 9.  ID/bacterial endocarditis.  Penicillin  G initiated 08/13/2023.  Continue through 09/24/2023.   - weekly labs for monitoring - Follow-up with infectious disease as outpt --[facility] 09/29/23 @0930  -6/2  labs reviewed and stable  10.  Hypertension.  Coreg  3.125 mg twice daily, Entresto  24-26 mg twice daily, Aldactone  12.5 mg daily, Demadex  20 mg daily, Farxiga  10 mg daily  - 6/3 increased DBP over last 36 hours. Unclear of source. Will adjust coreg  to 6.25 mg bid and obsv (HR in 90's)  -6/5 improved bp's until this morning's reading. Observe today before making any further changes.   6/6: BP elevated; stable 150s. Further management per PCP as OP-advised her to take BP every morning at home. -as needed hydralazine  25 mg made available but not given for elevated BP 180s, likely due to anxiety.     09/05/2023    6:30 AM 09/05/2023    6:09 AM 09/05/2023    4:30 AM  Vitals with BMI  Weight 252 lbs 14 oz    BMI 42.08    Systolic  154 151  Diastolic  95 103  Pulse  88 88    11.  Atrial flutter with RVR.  Status post DCCV 5/23.  Maintaining normal sinus rhythm.  Amiodarone  200 mg daily             -rate remains well controlled at present 12.  Diabetes mellitus.  Hemoglobin A1c 12.8.  Currently on NovoLog  3 units 3  times daily with meals, Semglee  25 units twice daily.  Prior to admission patient on Glucotrol  XL 10 mg daily, Lantus  insulin  20 units daily.   -reasonable control at present -Resume as needed -5-31: Tight control last night and this a.m., reduce Semglee  to 22 units twice daily--improved Recent Labs    09/04/23 1708 09/04/23 2024 09/05/23 0553  GLUCAP 126* 111* 115*    6/5 cbg's more reasonable with reduction of semglee  to 20u daily. However, only 75u this am but 108 yesterday. Will order HS snack.   6/6: well controlled  13.  History of breast cancer status post left mastectomy 2016.  Continue Arimidex  and follow-up outpatient 14.  Class III morbid obesity.  BMI 42.67.  Dietary follow-up    LOS: 7 days A FACE TO FACE EVALUATION WAS PERFORMED  Bea Lime 09/05/2023, 9:20 AM

## 2023-09-05 NOTE — Progress Notes (Signed)
 Patient ID: Melissa Bray, female   DOB: January 24, 1961, 63 y.o.   MRN: [id number]  INPATIENT REHABILITATION DISCHARGE NOTE   Discharge instructions by: Obed Bellows, PA-C  Verbalized understanding: Yes  Skin care/Wound care healing: Dressing changes completed  Pain: None reported  IV's: Right PICC line for home abx therapy.  Tubes/Drains: N/A  O2: N/A  Safety instructions: Provided  Patient belongings: Returned  Discharged to: Home  Discharged via: Car   Notes: [name], [facility] provided instructions for home abx administration.

## 2023-09-05 NOTE — Progress Notes (Signed)
 Wound care supplies given to patient. Per patient wound care education was done. No further questions noted.

## 2023-09-05 NOTE — Progress Notes (Addendum)
 PHARMACY - ANTICOAGULATION CONSULT NOTE  Pharmacy Consult for warfarin Indication: onX mMVR  No Known Allergies  Patient Measurements: Height: 5\' 5"  (165.1 cm) Weight: 114.7 kg (252 lb 13.9 oz) IBW/kg (Calculated) : 57 HEPARIN  DW (KG): 84.4  Vital Signs: Temp: 98.4 F (36.9 C) (06/06 0430) Temp Source: Oral (06/06 0430) BP: 154/95 (06/06 0609) Pulse Rate: 88 (06/06 0609)  Labs: Recent Labs    09/03/23 0330 09/04/23 0502 09/05/23 0420  LABPROT 36.0* 35.4* 36.9*  INR 3.6* 3.5* 3.7*    Estimated Creatinine Clearance: 70.5 mL/min (A) (by C-G formula based on SCr of 1.02 mg/dL (H)).   Medical History: Past Medical History:  Diagnosis Date   Anxiety    Breast cancer (HCC)    Diabetes mellitus without complication (HCC)    Hyperlipidemia    Hypertension    Type 2 diabetes mellitus without retinopathy (HCC) 06/17/2017    Medications:  Scheduled:   amiodarone   200 mg Oral Daily   anastrozole   1 mg Oral Daily   carvedilol   6.25 mg Oral BID WC   Chlorhexidine  Gluconate Cloth  6 each Topical Q12H   dapagliflozin  propanediol  10 mg Oral Daily   feeding supplement  237 mL Oral BID BM   Gerhardt's butt cream   Topical Daily   insulin  aspart  0-15 Units Subcutaneous TID WC   insulin  aspart  3 Units Subcutaneous TID WC   insulin  glargine-yfgn  20 Units Subcutaneous BID   pantoprazole   40 mg Oral Daily   sacubitril -valsartan   1 tablet Oral BID   sodium chloride  flush  10-40 mL Intracatheter Q12H   spironolactone   12.5 mg Oral Daily   torsemide   20 mg Oral Daily   warfarin  2 mg Oral ONCE-1600   [START ON 09/06/2023] warfarin  4 mg Oral q1600   Warfarin - Pharmacist Dosing Inpatient   Does not apply q1600    Assessment: 63 yo F presenting with acute mitral valve endocarditis with perforated mitral leaflet and severe MR. Underwent dental extraction 5/8. Presented for pre-op RHC and developed flash pulmonary edema requiring intubation - IABP was placed in lab and IV  heparin  started. No AC PTA.  Pt now s/p cardiac surgery with onX mMVR placed. Pharmacy consulted to start warfarin 5/16 after IABP removal 5/15. S/p DCCV 5/23 and maintaining NSR. Heparin  bridge dc'd once INR > 2. Amiodarone  started on 5/11  Patient has transferred to inpatient rehab.  Pharmacy continuing with warfarin dosing.  Warfarin doses have been variable with an elevated INR and PRBCs x 1 on 5/22.   UPDATE:  09/05/2023  INR 3.7 , supratherapeutic  (Goal INR 2.5-3.5).  Acute blood loss anemia,  last CBC done 6/2 with hgb stable in 9s and pltc wnl at 290k.    Plan discharge Fri 6/6, today. INR has been variable has been difficult to  establish on stable regimen.  She is also taking amiodarone  which increases warfarin effect. Based on  recent INR trends and doses given, will give Warfarin 2.5 mg today then try 5mg  daily except 2.5mg  every Monday and Friday as discharge dose. Needs outpatient INR check on Monday then at least weekly until stable.   Goal of Therapy:  INR 2.5-3.5 Monitor platelets by anticoagulation protocol: Yes   Plan:  Warfarin 2.5 mg today then tomorrow start  5 mg every TWThSS and 2.5mg  every Mon and Friday For discharge, she needs INR appointment with PCP or Cardiologist on Monday 09/08/23 and INR checks at least weekly for  now.  While inpatient monitor daily INR, CBC, clinical course, s/sx of bleed, PO intake/diet, Drug-Drug Interactions     Thank you for allowing pharmacy to be a part of this patient's care.   Alisa Irish, RPh Clinical Pharmacist Please refer to AMION for [facility] Pharmacy numbers 09/05/2023 7:22 AM  **Pharmacist phone directory can be found on [email or web address] listed under [facility]**

## 2023-09-06 DIAGNOSIS — I059 Rheumatic mitral valve disease, unspecified: Secondary | ICD-10-CM | POA: Diagnosis not present

## 2023-09-07 DIAGNOSIS — I059 Rheumatic mitral valve disease, unspecified: Secondary | ICD-10-CM | POA: Diagnosis not present

## 2023-09-08 DIAGNOSIS — I059 Rheumatic mitral valve disease, unspecified: Secondary | ICD-10-CM | POA: Diagnosis not present

## 2023-09-09 ENCOUNTER — Ambulatory Visit

## 2023-09-09 DIAGNOSIS — I059 Rheumatic mitral valve disease, unspecified: Secondary | ICD-10-CM | POA: Diagnosis not present

## 2023-09-10 DIAGNOSIS — I059 Rheumatic mitral valve disease, unspecified: Secondary | ICD-10-CM | POA: Diagnosis not present

## 2023-09-11 DIAGNOSIS — I059 Rheumatic mitral valve disease, unspecified: Secondary | ICD-10-CM | POA: Diagnosis not present

## 2023-09-12 DIAGNOSIS — I059 Rheumatic mitral valve disease, unspecified: Secondary | ICD-10-CM | POA: Diagnosis not present

## 2023-09-13 DIAGNOSIS — I059 Rheumatic mitral valve disease, unspecified: Secondary | ICD-10-CM | POA: Diagnosis not present

## 2023-09-14 DIAGNOSIS — I059 Rheumatic mitral valve disease, unspecified: Secondary | ICD-10-CM | POA: Diagnosis not present

## 2023-09-15 DIAGNOSIS — I059 Rheumatic mitral valve disease, unspecified: Secondary | ICD-10-CM | POA: Diagnosis not present

## 2023-09-16 ENCOUNTER — Ambulatory Visit: Payer: Self-pay | Attending: Thoracic Surgery (Cardiothoracic Vascular Surgery) | Admitting: Physician Assistant

## 2023-09-16 VITALS — BP 88/61 | Resp 18 | Ht 65.0 in

## 2023-09-16 DIAGNOSIS — I059 Rheumatic mitral valve disease, unspecified: Secondary | ICD-10-CM | POA: Diagnosis not present

## 2023-09-16 DIAGNOSIS — Z952 Presence of prosthetic heart valve: Secondary | ICD-10-CM

## 2023-09-16 NOTE — Progress Notes (Signed)
 [address] [place] [address]             [phone]    HPI:  Melissa Bray is S/P MVR after endocarditis infection.  She developed a mild dehiscence from her sternotomy incision which was being treated with wound care.  She presents today for wound check.  Overall she is doing very well.  She states that her appetite has not fully recovered but she is trying to eat better but is supplementing with protein shakes.  She is washing and cleaning her wounds daily as instructed  Current Outpatient Medications  Medication Sig Dispense Refill   alendronate  (FOSAMAX ) 70 MG tablet Take 1 tablet (70 mg total) by mouth once a week. Take with a full glass of water on an empty stomach. 12 tablet 3   amiodarone  (PACERONE ) 200 MG tablet Take 1 tablet (200 mg total) by mouth daily. 30 tablet 0   anastrozole  (ARIMIDEX ) 1 MG tablet Take 1 mg by mouth daily.     carvedilol  (COREG ) 6.25 MG tablet Take 1 tablet (6.25 mg total) by mouth 2 (two) times daily with a meal. 60 tablet 0   clonazePAM  (KLONOPIN ) 0.5 MG tablet Take 1 tablet (0.5 mg total) by mouth 2 (two) times daily as needed (anxiety). 30 tablet 0   Continuous Glucose Sensor (FREESTYLE LIBRE 3 PLUS SENSOR) MISC Inject 1 Units into the skin every 14 (fourteen) days. Change sensor every 15 days. 2 each 11   dapagliflozin  propanediol (FARXIGA ) 10 MG TABS tablet Take 1 tablet (10 mg total) by mouth daily. 30 tablet 0   glipiZIDE  (GLUCOTROL  XL) 10 MG 24 hr tablet Take 1 tablet (10 mg total) by mouth daily with breakfast. 30 tablet 0   insulin  glargine (LANTUS ) 100 UNIT/ML Solostar Pen Inject 20 Units into the skin 2 (two) times daily. 15 mL 11   Insulin  Pen Needle (PEN NEEDLES) 31G X 6 MM MISC 1 application  by Does not apply route at bedtime. 90 each 3   Insulin  Pen Needle 31G X 8 MM MISC Use as directed with your insulin  100 each 0   melatonin 5 MG TABS Take 1 tablet (5 mg total) by mouth at bedtime as needed. 30 tablet 0    metFORMIN  (GLUCOPHAGE ) 850 MG tablet Take 1 tablet (850 mg total) by mouth 2 (two) times daily with a meal. 60 tablet 0   Nystatin (GERHARDT'S BUTT CREAM) CREA Apply 1 Application topically daily as needed for irritation. 1 each 0   oxyCODONE  (OXY IR/ROXICODONE ) 5 MG immediate release tablet Take 1-2 tablets (5-10 mg total) by mouth every 3 (three) hours as needed for severe pain (pain score 7-10). 30 tablet 0   pantoprazole  (PROTONIX ) 40 MG tablet Take 1 tablet (40 mg total) by mouth daily. 30 tablet 0   penicillin  G IVPB Inject 24 Million Units into the vein daily for 20 days. Indication:  GBS MV IE First Dose: Yes Last Day of Therapy:  09/24/23 Labs - Once weekly:  CBC/D and BMP, Labs - Once weekly: ESR and CRP Method of administration: Elastomeric (Continuous infusion) Method of administration may be changed at the discretion of home infusion pharmacist based upon assessment of the patient and/or caregiver's ability to self-administer the medication ordered. 20 Units 0   pravastatin  (PRAVACHOL ) 20 MG tablet Take 1 tablet (20 mg total) by mouth daily. 30 tablet 0   sacubitril -valsartan  (ENTRESTO ) 49-51 MG Take 1 tablet  by mouth 2 (two) times daily. 60 tablet 0   spironolactone  (ALDACTONE ) 25 MG tablet Take 0.5 tablets (12.5 mg total) by mouth daily. 30 tablet 0   torsemide  (DEMADEX ) 20 MG tablet Take 1 tablet (20 mg total) by mouth daily. 30 tablet 0   Vitamin D , Ergocalciferol , (DRISDOL ) 1.25 MG (50000 UNIT) CAPS capsule TAKE 1 CAPSULE ONCE A WEEK FOR 12 WEEKS. AFTER 12 WEEKS SWITCH TO OVER THE COUNTER VITAMIN D  2000 IU 12 capsule 0   warfarin (COUMADIN ) 5 MG tablet Take one tablet (5 mg) on Tuesday, Wednesday, Thursday, Saturday, and Sunday and 1/2 tablet (2.5mg ) on Mondays and Fridays 30 tablet 0   No current facility-administered medications for this visit.    Physical Exam:  BP (!) 88/61 (BP Location: Right Wrist)   Resp 18   Ht 5' 5 (1.651 m)   SpO2 96%   BMI 42.08 kg/m   NAD.Melissa Bray  patient looks great  Sternal wound.. very clean and healing without evidence of infection, chest tube sites are clean and dry   A/P:  S/P MVR for Endocarditis--looks great, overall doing well Hypotension- she has follow up with cards/[facility] this week may need to adjust medications Wound care- continue diligent wound care.. incisions healing w/o evidence of infection,   Gates Kasal, PA-C [facility] [phone]

## 2023-09-17 ENCOUNTER — Encounter (HOSPITAL_BASED_OUTPATIENT_CLINIC_OR_DEPARTMENT_OTHER): Payer: Self-pay

## 2023-09-17 DIAGNOSIS — I059 Rheumatic mitral valve disease, unspecified: Secondary | ICD-10-CM | POA: Diagnosis not present

## 2023-09-17 NOTE — Progress Notes (Signed)
 Cardiology Office Note    Patient Name: Melissa Bray Date of Encounter: 09/18/2023  Primary Care Provider:  Kandace Organ, NP Primary Cardiologist:  Eilleen Grates, MD Primary Electrophysiologist: None   Past Medical History    Past Medical History:  Diagnosis Date   Anxiety    Breast cancer (HCC)    Diabetes mellitus without complication (HCC)    Hyperlipidemia    Hypertension    Type 2 diabetes mellitus without retinopathy (HCC) 06/17/2017    History of Present Illness  Melissa Bray is a 63 y.o. 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  Melissa Bray was admitted initially to [facility] on 07/31/2023 with severe sepsis felt secondary to left upper remedy cellulitis.  She was started on IV fluids and antibiotics and blood cultures were completed showing positive group B strep with TTE completed showing EF of 65 to 70% and moderate to severe MR.  She underwent TEE after transfer to [facility] which confirmed a mobile vegetation noted on both leaflets of the MV consistent with acute valvular endocarditis with perforation of the anterior mitral leaflet and severe MR.  She was consulted by cardiology and CTS who recommended surgical repair and dental extractions prior to procedure.  She underwent a preop RHC and developed flash pulmonary edema secondary to cardiogenic shock due to severe MR requiring intubation on the table.  She was started on IV Lasix  and intubated with discontinuation of Lasix .  She underwent mitral valve replacement on 08/14/2023 and placed on Coumadin  therapy with postop course complicated by RV failure and atrial flutter with IV amiodarone  initiated and TEE/DCCV completed on 08/22/2023.  She was converted to sinus rhythm and improved significantly from a volume standpoint  with aggressive IV diuresis.  She was transition to inpatient rehab due to debility and multiple medical issues.  She was discharged on 09/05/2023 and seen by CVTS on 09/17/2023 and reported doing well overall with mild dehiscence of sternotomy incision and treated by wound care.  She was noted to have some hypotension with adjustment of medical therapy needed.  She is scheduled to see advanced heart failure on 09/19/2023.  Melissa Bray presents today for posthospital follow-up.  She reports feeling much better since her hospitalization but does endorse ongoing fatigue that occurs with walking and completing daily tasks.  She is euvolemic on exam today with no shortness of breath or lower extremity swelling.  She has been compliant with her current medications and denies any adverse reactions.  She notes a poor appetite since being home but is staying hydrated with at least 64 ounces of water daily.  She continues to apply clean dressings to her incision which no longer requires packing.  She reports no inappropriate bleeding or increased bruising with Coumadin .  She denies any palpitations or tachycardia since her discharge.  During today's visit we discussed the importance of Coumadin  checks and dental prophylaxis due to her prosthetic valve.  She is excited to begin cardiac rehab and is looking forward to regaining her strength and endurance. Patient denies chest pain, palpitations, dyspnea, PND, orthopnea, nausea, vomiting, dizziness, syncope, edema, weight gain, or early satiety. History of Present Illness   Review of Systems  Please see the history of present illness.    All other systems reviewed and are otherwise negative except as noted  above.  Physical Exam    Wt Readings from Last 3 Encounters:  09/18/23 243 lb (110.2 kg)  09/05/23 252 lb 13.9 oz (114.7 kg)  08/29/23 253 lb 12 oz (115.1 kg)   VS: Vitals:   09/18/23 1437  BP: 139/86  Pulse: 99  SpO2: 98%  ,Body mass index is 40.44  kg/m. GEN: Well nourished, well developed in no acute distress Neck: No JVD; No carotid bruits Pulmonary: Clear to auscultation without rales, wheezing or rhonchi  Cardiovascular: Normal rate. Regular rhythm. Normal S1. Normal S2.  Sternal incision with mild wound dehiscence healing with no signs of infection  Murmurs: Systolic murmur ABDOMEN: Soft, non-tender, non-distended EXTREMITIES:  No edema; No deformity   EKG/LABS/ Recent Cardiac Studies   ECG personally reviewed by me today -none completed today  Risk Assessment/Calculations:    CHA2DS2-VASc Score = 4   This indicates a 4.8% annual risk of stroke. The patient's score is based upon: CHF History: 1 HTN History: 1 Diabetes History: 1 Stroke History: 0 Vascular Disease History: 0 Age Score: 0 Gender Score: 1         Lab Results  Component Value Date   WBC 4.9 09/01/2023   HGB 9.1 (L) 09/01/2023   HCT 28.8 (L) 09/01/2023   MCV 91.4 09/01/2023   PLT 290 09/01/2023   Lab Results  Component Value Date   CREATININE 1.02 (H) 09/01/2023   BUN 12 09/01/2023   NA 136 09/01/2023   K 3.9 09/01/2023   CL 104 09/01/2023   CO2 26 09/01/2023   Lab Results  Component Value Date   CHOL 95 08/07/2023   HDL 27 (L) 08/07/2023   LDLCALC 58 08/07/2023   LDLDIRECT 97.0 04/03/2023   TRIG 73 08/12/2023   CHOLHDL 3.5 08/07/2023    Lab Results  Component Value Date   HGBA1C 12.8 (H) 07/31/2023   Assessment & Plan   Assessment & Plan   1.  Severe MR due to endocarditis: -s/p MVR secondary to perforation and bacterial endocarditis -TEE on 5/5 showed LV EF 65%, normal RV function, severe MR with perforated leaflet and mobile vegetation.  - Today patient reports no chest pain but does endorse increased fatigue with functional activities. - SBE prophylaxis discussed for dental procedures - She is euvolemic on exam and tolerating diuretics with good diuresis. - Continue carvedilol  12.5 mg twice daily, Farxiga  10 mg, Entresto   49/51 mg twice daily, spironolactone  12.5 mg, torsemide  20 mg daily - Patient aware to follow-up with Coumadin  clinic for dose adjustments -Low sodium diet, fluid restriction <2L, and daily weights encouraged. Educated to contact our office for weight gain of 2 lbs overnight or 5 lbs in one week.   2.  Paroxysmal atrial flutter: - s/p TEE and DCCV on 08/22/23 and maintaining sinus rhythm/sinus tach - Continue amiodarone  200 mg daily - We will increase carvedilol  to 12.5 mg twice daily - Continue Coumadin  per Coumadin  clinic  3.  Essential hypertension: - Patient's blood pressure today was stable at 139/86 per thigh cuff - Patient will increase carvedilol  to 12.5 mg twice daily and will continue spironolactone  12.5 mg and Entresto  49/51 mg twice daily  4.  DM type II: - Patient's last hemoglobin A1c was 12.8 - Continue Farxiga  10 mg and current treatment plan per PCP  5.  Morbid obesity: -Patient is cleared to begin cardiac rehab - Body mass index is 40.44 kg/m.  Disposition: Follow-up with Eilleen Grates, MD or APP in 3 months  Signed, Francene Ing, Retha Cast, NP 09/18/2023, 6:08 PM [facility]

## 2023-09-18 ENCOUNTER — Encounter: Payer: Self-pay | Admitting: Nurse Practitioner

## 2023-09-18 ENCOUNTER — Ambulatory Visit: Attending: Nurse Practitioner | Admitting: Nurse Practitioner

## 2023-09-18 ENCOUNTER — Telehealth (HOSPITAL_COMMUNITY): Payer: Self-pay

## 2023-09-18 VITALS — BP 139/86 | HR 99 | Ht 65.0 in | Wt 243.0 lb

## 2023-09-18 DIAGNOSIS — I10 Essential (primary) hypertension: Secondary | ICD-10-CM | POA: Diagnosis not present

## 2023-09-18 DIAGNOSIS — I480 Paroxysmal atrial fibrillation: Secondary | ICD-10-CM | POA: Diagnosis not present

## 2023-09-18 DIAGNOSIS — Z952 Presence of prosthetic heart valve: Secondary | ICD-10-CM

## 2023-09-18 DIAGNOSIS — I059 Rheumatic mitral valve disease, unspecified: Secondary | ICD-10-CM | POA: Diagnosis not present

## 2023-09-18 DIAGNOSIS — E785 Hyperlipidemia, unspecified: Secondary | ICD-10-CM

## 2023-09-18 MED ORDER — CARVEDILOL 12.5 MG PO TABS: 12.5000 mg | ORAL_TABLET | Freq: Two times a day (BID) | ORAL | 3 refills | Status: DC

## 2023-09-18 NOTE — Patient Instructions (Addendum)
 Medication Instructions:  INCREASE Coreg  to 12.5mg  Take 1 tablet twice a day *If you need a refill on your cardiac medications before your next appointment, please call your pharmacy*  Lab Work: None ordered If you have labs (blood work) drawn today and your tests are completely normal, you will receive your results only by: MyChart Message (if you have MyChart) OR A paper copy in the mail If you have any lab test that is abnormal or we need to change your treatment, we will call you to review the results.  Testing/Procedures: None ordered  Follow-Up: At [facility], you and your health needs are our priority.  As part of our continuing mission to provide you with exceptional heart care, our providers are all part of one team.  This team includes your primary Cardiologist (physician) and Advanced Practice Providers or APPs (Physician Assistants and Nurse Practitioners) who all work together to provide you with the care you need, when you need it.  Your next appointment:   3 month(s)  Provider:   Eilleen Grates, MD or Charles Connor, NP   We recommend signing up for the patient portal called MyChart.  Sign up information is provided on this After Visit Summary.  MyChart is used to connect with patients for Virtual Visits (Telemedicine).  Patients are able to view lab/test results, encounter notes, upcoming appointments, etc.  Non-urgent messages can be sent to your provider as well.   To learn more about what you can do with MyChart, go to [email or web address].   Other Instructions Check your blood pressure daily for 2 weeks, then contact the office with your readings.  Contact the office either by phone or MyChart with your readings.  Make sure to check your blood pressure 2 hours after taking your medications.   AVOID these things for 30 minutes before checking your blood pressure: No Drinking caffeine. No Drinking alcohol. No Eating. No Smoking. No  Exercising.  Five minutes before checking your blood pressure: Pee. Sit in a dining chair. Avoid sitting in a soft couch or armchair. Be quiet. Do not talk.

## 2023-09-18 NOTE — Telephone Encounter (Signed)
 Called to confirm/remind patient of their appointment at the [facility] on 09/19/23.   Appointment:   [x] Confirmed  [] Left mess   [] No answer/No voice mail  [] VM Full/unable to leave message  [] Phone not in service  Patient reminded to bring all medications and/or complete list.  Confirmed patient has transportation. Gave directions, instructed to utilize valet parking.

## 2023-09-19 ENCOUNTER — Other Ambulatory Visit: Payer: Self-pay

## 2023-09-19 ENCOUNTER — Encounter: Payer: Self-pay | Admitting: Internal Medicine

## 2023-09-19 ENCOUNTER — Ambulatory Visit (HOSPITAL_COMMUNITY)
Admit: 2023-09-19 | Discharge: 2023-09-19 | Disposition: A | Discharge status: Home / Self Care | Attending: Cardiology | Admitting: Cardiology

## 2023-09-19 ENCOUNTER — Ambulatory Visit

## 2023-09-19 ENCOUNTER — Other Ambulatory Visit (HOSPITAL_COMMUNITY): Payer: Self-pay | Admitting: Cardiology

## 2023-09-19 ENCOUNTER — Ambulatory Visit (INDEPENDENT_AMBULATORY_CARE_PROVIDER_SITE_OTHER): Payer: Self-pay | Admitting: Internal Medicine

## 2023-09-19 ENCOUNTER — Telehealth: Payer: Self-pay

## 2023-09-19 ENCOUNTER — Encounter (HOSPITAL_COMMUNITY): Payer: Self-pay

## 2023-09-19 VITALS — BP 111/76 | HR 100 | Resp 16 | Ht 65.0 in | Wt 244.0 lb

## 2023-09-19 VITALS — BP 92/50 | HR 98 | Wt 243.2 lb

## 2023-09-19 DIAGNOSIS — R6520 Severe sepsis without septic shock: Secondary | ICD-10-CM

## 2023-09-19 DIAGNOSIS — E119 Type 2 diabetes mellitus without complications: Secondary | ICD-10-CM | POA: Diagnosis not present

## 2023-09-19 DIAGNOSIS — I340 Nonrheumatic mitral (valve) insufficiency: Secondary | ICD-10-CM | POA: Diagnosis not present

## 2023-09-19 DIAGNOSIS — Z952 Presence of prosthetic heart valve: Secondary | ICD-10-CM

## 2023-09-19 DIAGNOSIS — E785 Hyperlipidemia, unspecified: Secondary | ICD-10-CM | POA: Diagnosis not present

## 2023-09-19 DIAGNOSIS — Z794 Long term (current) use of insulin: Secondary | ICD-10-CM | POA: Insufficient documentation

## 2023-09-19 DIAGNOSIS — I5032 Chronic diastolic (congestive) heart failure: Secondary | ICD-10-CM | POA: Diagnosis not present

## 2023-09-19 DIAGNOSIS — Z7984 Long term (current) use of oral hypoglycemic drugs: Secondary | ICD-10-CM | POA: Insufficient documentation

## 2023-09-19 DIAGNOSIS — I110 Hypertensive heart disease with heart failure: Secondary | ICD-10-CM | POA: Insufficient documentation

## 2023-09-19 DIAGNOSIS — I059 Rheumatic mitral valve disease, unspecified: Secondary | ICD-10-CM | POA: Diagnosis not present

## 2023-09-19 DIAGNOSIS — Z7901 Long term (current) use of anticoagulants: Secondary | ICD-10-CM | POA: Diagnosis not present

## 2023-09-19 DIAGNOSIS — Z79899 Other long term (current) drug therapy: Secondary | ICD-10-CM | POA: Diagnosis not present

## 2023-09-19 DIAGNOSIS — Z7982 Long term (current) use of aspirin: Secondary | ICD-10-CM | POA: Diagnosis not present

## 2023-09-19 DIAGNOSIS — Z853 Personal history of malignant neoplasm of breast: Secondary | ICD-10-CM | POA: Insufficient documentation

## 2023-09-19 DIAGNOSIS — I4892 Unspecified atrial flutter: Secondary | ICD-10-CM | POA: Diagnosis not present

## 2023-09-19 DIAGNOSIS — A401 Sepsis due to streptococcus, group B: Secondary | ICD-10-CM | POA: Diagnosis not present

## 2023-09-19 DIAGNOSIS — I440 Atrioventricular block, first degree: Secondary | ICD-10-CM | POA: Insufficient documentation

## 2023-09-19 DIAGNOSIS — Z9012 Acquired absence of left breast and nipple: Secondary | ICD-10-CM | POA: Diagnosis not present

## 2023-09-19 MED ORDER — CARVEDILOL 6.25 MG PO TABS: 6.2500 mg | ORAL_TABLET | Freq: Two times a day (BID) | ORAL | 6 refills | Status: AC

## 2023-09-19 NOTE — Progress Notes (Signed)
 Advanced [facility] Note   Referring Physician: PCP: Kandace Organ, NP PCP-Cardiologist: Eilleen Grates, MD  AHF: Dr. Mitzie Anda   Chief Complaint: f/u for HF  HPI:  Ms. Crotwell is a 63 year old female with PMH of HTN, HLD, T2DM, bresat cancer s/p L masectomy. No known cardiac history. Myoview in 2017 at [facility] was low risk and negative for ischemia.    [facility]ed [facility] on [facility] with weakness and fevers. She was found to be in severe sepsis felt to be due to LUE cellulitis. Vitals notable for fever, ST in low 100s, and hypotensive. Lab notable for Na 131, K 3.6, glucose 246, Cr 0.99, LA 1.4, A1C 12.8. Blood cultures positive for streptococcus agalacticae. Started on antibiotics and given 2L IVF + IVMF. EKG showed ST 106 bpm. She was admitted to [facility]. LUE DVT study negative. Murmur found on exam and echo ordered, which showed EF 65-70% and nl RV, mod to likely severe MR.    She was transferred to [facility] for TEE showing acute mitral valve endocarditis with perforated mitral leaflet and severe MR (with mobile vegetation of both MV leaflets), no LAA thrombus, aneurysmal IAS with R bowing (no shunt), and normal EF. CTS was consulted for surgical repair. During surgical evaluation orthopanogram showed likely dental infectious source with evidence of abscess. Underwent dental extraction 5/8. L/RHC: no CAD, RA 9, PA 34/21 (27), PCWP 13, LVEDP 12, PAPi 1.4. During pre-operative RHC for cardiac optimization, she developed flash pulmonary edema requiring intubation while on the table, likely related to MR and high IVF volume received pre-operatively. Required high-dose pressors. Started on IV Lasix  gtt, however once intubated LVEDP and PCWP were 12 and 11 respectively so Lasix  stopped. Once medically stable, she underwent On-X mechanical MVR on 08/14/23. Post-op course c/b RV Failure w/ volume overload and afib w/ RVR. She was diuresed w/ high dose  lasix  gtt w/ milrinone  support. Once fully diuresed, she underwent successful DCCV back to NSR. Transitioned to PO amio. She was sent CIR before being discharged home. She was continued on IV abx w/ On PenG x 6 wk.  She reports today for hospital f/u. Doing fairly well. Still taking things slow but able to move about her house w/o exertional dyspnea. She has some mild fatigue. No CP. Checking wt daily at home and has been stable. No LEE. Denies palpitations. Compliant w/ meds. BP well controlled. SBP soft 90s but asymptomatic. No abnormal bleeding w/ coumadin .   EKG shows NSR, 99 bpm w/ 1st deg AVB.   Still getting IV abx. She had appt w/ ID earlier today. Felt doing ok from ID standpoint. No change in plan. End date for abx scheduled for 09/24/23.    Review of Systems: [y] = yes, [ ]  = no   General: Weight gain [ ] ; Weight loss [ ] ; Anorexia [ ] ; Fatigue [ ] ; Fever [ ] ; Chills [ ] ; Weakness [ ]   Cardiac: Chest pain/pressure [ ] ; Resting SOB [ ] ; Exertional SOB [ ] ; Orthopnea [ ] ; Pedal Edema [ ] ; Palpitations [ ] ; Syncope [ ] ; Presyncope [ ] ; Paroxysmal nocturnal dyspnea[ ]   Pulmonary: Cough [ ] ; Wheezing[ ] ; Hemoptysis[ ] ; Sputum [ ] ; Snoring [ ]   GI: Vomiting[ ] ; Dysphagia[ ] ; Melena[ ] ; Hematochezia [ ] ; Heartburn[ ] ; Abdominal pain [ ] ; Constipation [ ] ; Diarrhea [ ] ; BRBPR [ ]   GU: Hematuria[ ] ; Dysuria [ ] ; Nocturia[ ]   Vascular: Pain in legs  with walking [ ] ; Pain in feet with lying flat [ ] ; Non-healing sores [ ] ; Stroke [ ] ; TIA [ ] ; Slurred speech [ ] ;  Neuro: Headaches[ ] ; Vertigo[ ] ; Seizures[ ] ; Paresthesias[ ] ;Blurred vision [ ] ; Diplopia [ ] ; Vision changes [ ]   Ortho/Skin: Arthritis [ ] ; Joint pain [ ] ; Muscle pain [ ] ; Joint swelling [ ] ; Back Pain [ ] ; Rash [ ]   Psych: Depression[ ] ; Anxiety[ ]   Heme: Bleeding problems [ ] ; Clotting disorders [ ] ; Anemia [ ]   Endocrine: Diabetes [ ] ; Thyroid  dysfunction[ ]    Past Medical History:  Diagnosis Date   Anxiety    Breast cancer  (HCC)    Diabetes mellitus without complication (HCC)    Hyperlipidemia    Hypertension    Type 2 diabetes mellitus without retinopathy (HCC) 06/17/2017    Current Outpatient Medications  Medication Sig Dispense Refill   alendronate  (FOSAMAX ) 70 MG tablet Take 1 tablet (70 mg total) by mouth once a week. Take with a full glass of water on an empty stomach. 12 tablet 3   amiodarone  (PACERONE ) 200 MG tablet Take 1 tablet (200 mg total) by mouth daily. 30 tablet 0   anastrozole  (ARIMIDEX ) 1 MG tablet Take 1 mg by mouth daily.     clonazePAM  (KLONOPIN ) 0.5 MG tablet Take 1 tablet (0.5 mg total) by mouth 2 (two) times daily as needed (anxiety). 30 tablet 0   Continuous Glucose Sensor (FREESTYLE LIBRE 3 PLUS SENSOR) MISC Inject 1 Units into the skin every 14 (fourteen) days. Change sensor every 15 days. 2 each 11   dapagliflozin  propanediol (FARXIGA ) 10 MG TABS tablet Take 1 tablet (10 mg total) by mouth daily. 30 tablet 0   glipiZIDE  (GLUCOTROL  XL) 10 MG 24 hr tablet Take 1 tablet (10 mg total) by mouth daily with breakfast. 30 tablet 0   insulin  glargine (LANTUS ) 100 UNIT/ML Solostar Pen Inject 20 Units into the skin 2 (two) times daily. 15 mL 11   Insulin  Pen Needle (PEN NEEDLES) 31G X 6 MM MISC 1 application  by Does not apply route at bedtime. 90 each 3   Insulin  Pen Needle 31G X 8 MM MISC Use as directed with your insulin  100 each 0   melatonin 5 MG TABS Take 1 tablet (5 mg total) by mouth at bedtime as needed. 30 tablet 0   metFORMIN  (GLUCOPHAGE ) 850 MG tablet Take 1 tablet (850 mg total) by mouth 2 (two) times daily with a meal. 60 tablet 0   Nystatin (GERHARDT'S BUTT CREAM) CREA Apply 1 Application topically daily as needed for irritation. 1 each 0   oxyCODONE  (OXY IR/ROXICODONE ) 5 MG immediate release tablet Take 1-2 tablets (5-10 mg total) by mouth every 3 (three) hours as needed for severe pain (pain score 7-10). 30 tablet 0   pantoprazole  (PROTONIX ) 40 MG tablet Take 1 tablet (40 mg  total) by mouth daily. 30 tablet 0   penicillin  G IVPB Inject 24 Million Units into the vein daily for 20 days. Indication:  GBS MV IE First Dose: Yes Last Day of Therapy:  09/24/23 Labs - Once weekly:  CBC/D and BMP, Labs - Once weekly: ESR and CRP Method of administration: Elastomeric (Continuous infusion) Method of administration may be changed at the discretion of home infusion pharmacist based upon assessment of the patient and/or caregiver's ability to self-administer the medication ordered. 20 Units 0   pravastatin  (PRAVACHOL ) 20 MG tablet Take 1 tablet (20 mg total) by mouth daily. 30  tablet 0   sacubitril -valsartan  (ENTRESTO ) 49-51 MG Take 1 tablet by mouth 2 (two) times daily. 60 tablet 0   spironolactone  (ALDACTONE ) 25 MG tablet Take 0.5 tablets (12.5 mg total) by mouth daily. 30 tablet 0   torsemide  (DEMADEX ) 20 MG tablet Take 1 tablet (20 mg total) by mouth daily. 30 tablet 0   Vitamin D , Ergocalciferol , (DRISDOL ) 1.25 MG (50000 UNIT) CAPS capsule TAKE 1 CAPSULE ONCE A WEEK FOR 12 WEEKS. AFTER 12 WEEKS SWITCH TO OVER THE COUNTER VITAMIN D  2000 IU 12 capsule 0   warfarin (COUMADIN ) 5 MG tablet Take one tablet (5 mg) on Tuesday, Wednesday, Thursday, Saturday, and Sunday and 1/2 tablet (2.5mg ) on Mondays and Fridays 30 tablet 0   carvedilol  (COREG ) 6.25 MG tablet Take 1 tablet (6.25 mg total) by mouth 2 (two) times daily with a meal. 60 tablet 6   No current facility-administered medications for this encounter.    No Known Allergies    Social History   Socioeconomic History   Marital status: Divorced    Spouse name: Not on file   Number of children: 2   Years of education: Not on file   Highest education level: Not on file  Occupational History   Not on file  Tobacco Use   Smoking status: Never    Passive exposure: Never   Smokeless tobacco: Never  Vaping Use   Vaping status: Never Used  Substance and Sexual Activity   Alcohol use: Never   Drug use: Never   Sexual  activity: Not Currently    Birth control/protection: Surgical  Other Topics Concern   Not on file  Social History Narrative   Not on file   Social Drivers of Health   Financial Resource Strain: Not on file  Food Insecurity: No Food Insecurity (07/31/2023)   Hunger Vital Sign    Worried About Running Out of Food in the Last Year: Never true    Ran Out of Food in the Last Year: Never true  Transportation Needs: No Transportation Needs (07/31/2023)   PRAPARE - Administrator, Civil Service (Medical): No    Lack of Transportation (Non-Medical): No  Physical Activity: Not on file  Stress: Not on file  Social Connections: Not on file  Intimate Partner Violence: Not At Risk (07/31/2023)   Humiliation, Afraid, Rape, and Kick questionnaire    Fear of Current or Ex-Partner: No    Emotionally Abused: No    Physically Abused: No    Sexually Abused: No      Family History  Problem Relation Age of Onset   Asthma Mother    Cancer Father        prostate cancer   Diabetes Father    Hypertension Father    Cancer Brother        prostate cancer   Asthma Brother    Colon cancer Neg Hx    Colon polyps Neg Hx    Crohn's disease Neg Hx    Esophageal cancer Neg Hx    Rectal cancer Neg Hx    Stomach cancer Neg Hx    Ulcerative colitis Neg Hx     Vitals:   09/19/23 1045  BP: (!) 92/50  Pulse: 98  SpO2: 98%  Weight: 110.3 kg (243 lb 3.2 oz)     PHYSICAL EXAM: General:  Well appearing, obese. No respiratory difficulty HEENT: normal Neck: supple. no JVD. Carotids 2+ bilat; no bruits. No lymphadenopathy or thyromegaly appreciated. Cor: PMI nondisplaced. Regular  rate & rhythm. + crisp mechanical valve sounds. Sternotomy wound ok.  Lungs: clear Abdomen: soft, nontender, nondistended. No hepatosplenomegaly. No bruits or masses. Good bowel sounds. Extremities: no cyanosis, clubbing, rash, edema + PICC RUE  Neuro: alert & oriented x 3, cranial nerves grossly intact. moves all 4  extremities w/o difficulty. Affect pleasant.  ECG: NSR 99 bpm, personally reviewed    ASSESSMENT & PLAN:  1. Chronic Diastolic Heart Failure w/ Prominent RV Dysfunction: developed CGS while hospitalized 5/25 for severe MR 2/2 endocarditis. TEE on 5/5 showed LV EF 65%, normal RV function, severe MR with perforated leaflet and mobile vegetation.  Patient developed flash pulmonary edema 5/9 in setting of marked hypertension -> IABP placed. S/p mMVR 08/14/23. Post op echo 5/24: EF 55-60%, mildly D-shaped ventricle with mild RV dysfunction and mild RV enlargement, IVC dilated, On-X mechanical MV functioning normally. NYHA Class II. Euvolemic on exam  - continue Farxiga  10 mg daily  - continue Entresto  49-51 mg bid. BP too soft for titration  - continue Coreg  6.25 mg bid  - continue spironlaconte 12.5 mg daily  - continue torsemide  40 mg daily  - check BMP and BNP   2. Mitral regurgitation: Severe on TEE with perforated anterior leaflet and mitral valve vegetation.  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 S/p On-X mechanical MVR with Dr. Luna Salinas 08/14/23 - post-op antibiotics per ID. On PenG, will require 6 weeks IV abx (stop date 09/24/23) - Now on warfarin, goal INR 2.5 -3.5. Also on ASA 81. INR followed in coumadin  clinic     4. Atrial Flutter  - S/p DCCV 5/23. EKG today shows NSR  - Continue PO amiodarone  200 mg daily . Check Amio labs - on Coumadin , dosing per Coumadin  Clinic   F/u w/ Dr. Mitzie Anda in 2-3 months    Ruddy Corral, PA-C 09/19/23

## 2023-09-19 NOTE — Progress Notes (Signed)
 Pt stuck multiple times for labs, unsuccessfully, Ruddy Corral, PA aware, advises ok to wait until next week, lab appt sch for 6/26

## 2023-09-19 NOTE — Telephone Encounter (Signed)
  Per provider ok to PULL PICC after End Date.   Provider: Cephas Collier MD  End Date:09/24/23   Notified [facility] and [facility].

## 2023-09-19 NOTE — Patient Instructions (Signed)
 You are doing well from id standpoint   5 more days iv antibiotics and the hh can remove the picc  Continue to follow up closely with cardiology    No need to follow with ID clinic further. However, if fever, chill, focal midline back or joint pain off antibiotics, please let us  know

## 2023-09-19 NOTE — Patient Instructions (Signed)
 Great to see you today!!!  Medication Changes:  Keep Carvedilol  at 6.25 mg Twice daily   Lab Work:  Labs done today, your results will be available in MyChart, we will contact you for abnormal readings.   Special Instructions // Education:  Do the following things EVERYDAY: Weigh yourself in the morning before breakfast. Write it down and keep it in a log. Take your medicines as prescribed Eat low salt foods--Limit salt (sodium) to 2000 mg per day.  Stay as active as you can everyday Limit all fluids for the day to less than 2 liters   Follow-Up in: 3 months with Dr Mitzie Anda   At the [facility], you and your health needs are our priority. We have a designated team specialized in the treatment of Heart Failure. This Care Team includes your primary Heart Failure Specialized Cardiologist (physician), Advanced Practice Providers (APPs- Physician Assistants and Nurse Practitioners), and Pharmacist who all work together to provide you with the care you need, when you need it.   You may see any of the following providers on your designated Care Team at your next follow up:  Dr. Jules Oar Dr. Peder Bourdon Dr. Alwin Baars Dr. Judyth Nunnery Nieves Bars, NP Ruddy Corral, [place] [facility] [place], [place] Dennise Fitz, NP [place] Lee, NP Luster Salters, PharmD   Please be sure to bring in all your medications bottles to every appointment.   Need to Contact Us :  If you have any questions or concerns before your next appointment please send us  a message through [place] or call our office at [phone].    TO LEAVE A MESSAGE FOR THE NURSE SELECT OPTION 2, PLEASE LEAVE A MESSAGE INCLUDING: YOUR NAME DATE OF BIRTH CALL BACK NUMBER REASON FOR CALL**this is important as we prioritize the call backs  YOU WILL RECEIVE A CALL BACK THE SAME DAY AS LONG AS YOU CALL BEFORE 4:00 PM

## 2023-09-19 NOTE — Progress Notes (Signed)
 [facility]  Patient Active Problem List   Diagnosis Date Noted   Difficulty coping with disease 09/03/2023   Debility 08/29/2023   Physical deconditioning 08/21/2023   Acute respiratory failure with hypoxia (HCC) 08/21/2023   Acute bacterial endocarditis 08/21/2023   S/P MVR (mitral valve replacement) 08/13/2023   Cardiogenic shock (HCC) 08/08/2023   Shock (HCC) 08/08/2023   Hypomagnesemia 08/06/2023   Hypokalemia 08/06/2023   Hyponatremia 08/06/2023   Sinus tachycardia 08/06/2023   Mitral valve endocarditis 08/03/2023   Severe sepsis (HCC) 08/01/2023   Cellulitis of left upper extremity 08/01/2023   Obesity, Class III, BMI 40-49.9 (morbid obesity) 08/01/2023   Perforation of leaflet of mitral valve 08/01/2023   GAD (generalized anxiety disorder) 08/01/2023   History of breast cancer 08/01/2023   SIRS (systemic inflammatory response syndrome) (HCC) 07/31/2023   Lymphedema of left upper extremity 05/27/2023   Long-term (current) use of injectable non-insulin  antidiabetic drugs 01/29/2023   HLD (hyperlipidemia) 01/29/2023   Nephrolithiasis 10/29/2022   Adenomatous colon polyp 10/29/2022   Colon polyp 01/05/2022   S/P hysterectomy 08/14/2021   Osteopenia 08/14/2021   Morbid obesity (HCC) 09/06/2020   DM2 (diabetes mellitus, type 2) (HCC) 07/15/2019   Neuropathy due to chemotherapeutic drug (HCC) 02/02/2018   Hematuria 02/02/2018   Macular drusen, bilateral 07/09/2017   GERD (gastroesophageal reflux disease) 04/28/2017   Long term (current) use of aromatase inhibitors 11/13/2015   Depression, recurrent (HCC) 09/22/2014   Essential hypertension 09/22/2014   Breast cancer, stage 3 (HCC) 08/31/2014      Subjective:    Patient ID: Melissa Bray, female    DOB: 09/26/1960, 63 y.o.   MRN: [id number]  Chief Complaint  Patient presents with   Hospitalization Follow-up    HPI:  Melissa Bray is a 63 y.o. female here  for endocarditis f/u   09/19/23 see a&p for detail She has no complaint today     No Known Allergies    Outpatient Medications Prior to Visit  Medication Sig Dispense Refill   alendronate  (FOSAMAX ) 70 MG tablet Take 1 tablet (70 mg total) by mouth once a week. Take with a full glass of water on an empty stomach. 12 tablet 3   amiodarone  (PACERONE ) 200 MG tablet Take 1 tablet (200 mg total) by mouth daily. 30 tablet 0   anastrozole  (ARIMIDEX ) 1 MG tablet Take 1 mg by mouth daily.     carvedilol  (COREG ) 12.5 MG tablet Take 1 tablet (12.5 mg total) by mouth 2 (two) times daily with a meal. 30 tablet 3   clonazePAM  (KLONOPIN ) 0.5 MG tablet Take 1 tablet (0.5 mg total) by mouth 2 (two) times daily as needed (anxiety). 30 tablet 0   Continuous Glucose Sensor (FREESTYLE LIBRE 3 PLUS SENSOR) MISC Inject 1 Units into the skin every 14 (fourteen) days. Change sensor every 15 days. 2 each 11   dapagliflozin  propanediol (FARXIGA ) 10 MG TABS tablet Take 1 tablet (10 mg total) by mouth daily. 30 tablet 0   glipiZIDE  (GLUCOTROL  XL) 10 MG 24 hr tablet Take 1 tablet (10 mg total) by mouth daily with breakfast. 30 tablet 0   insulin  glargine (LANTUS ) 100 UNIT/ML Solostar Pen Inject 20 Units into the skin 2 (two) times daily. 15 mL 11   Insulin  Pen Needle (PEN NEEDLES) 31G X 6 MM MISC 1 application  by Does not apply route at bedtime. 90 each 3   Insulin  Pen Needle 31G X 8  MM MISC Use as directed with your insulin  100 each 0   melatonin 5 MG TABS Take 1 tablet (5 mg total) by mouth at bedtime as needed. 30 tablet 0   metFORMIN  (GLUCOPHAGE ) 850 MG tablet Take 1 tablet (850 mg total) by mouth 2 (two) times daily with a meal. 60 tablet 0   Nystatin (GERHARDT'S BUTT CREAM) CREA Apply 1 Application topically daily as needed for irritation. 1 each 0   oxyCODONE  (OXY IR/ROXICODONE ) 5 MG immediate release tablet Take 1-2 tablets (5-10 mg total) by mouth every 3 (three) hours as needed for severe pain (pain score  7-10). 30 tablet 0   pantoprazole  (PROTONIX ) 40 MG tablet Take 1 tablet (40 mg total) by mouth daily. 30 tablet 0   penicillin  G IVPB Inject 24 Million Units into the vein daily for 20 days. Indication:  GBS MV IE First Dose: Yes Last Day of Therapy:  09/24/23 Labs - Once weekly:  CBC/D and BMP, Labs - Once weekly: ESR and CRP Method of administration: Elastomeric (Continuous infusion) Method of administration may be changed at the discretion of home infusion pharmacist based upon assessment of the patient and/or caregiver's ability to self-administer the medication ordered. 20 Units 0   pravastatin  (PRAVACHOL ) 20 MG tablet Take 1 tablet (20 mg total) by mouth daily. 30 tablet 0   sacubitril -valsartan  (ENTRESTO ) 49-51 MG Take 1 tablet by mouth 2 (two) times daily. 60 tablet 0   spironolactone  (ALDACTONE ) 25 MG tablet Take 0.5 tablets (12.5 mg total) by mouth daily. 30 tablet 0   torsemide  (DEMADEX ) 20 MG tablet Take 1 tablet (20 mg total) by mouth daily. 30 tablet 0   Vitamin D , Ergocalciferol , (DRISDOL ) 1.25 MG (50000 UNIT) CAPS capsule TAKE 1 CAPSULE ONCE A WEEK FOR 12 WEEKS. AFTER 12 WEEKS SWITCH TO OVER THE COUNTER VITAMIN D  2000 IU 12 capsule 0   warfarin (COUMADIN ) 5 MG tablet Take one tablet (5 mg) on Tuesday, Wednesday, Thursday, Saturday, and Sunday and 1/2 tablet (2.5mg ) on Mondays and Fridays 30 tablet 0   No facility-administered medications prior to visit.     Social History   Socioeconomic History   Marital status: Divorced    Spouse name: Not on file   Number of children: 2   Years of education: Not on file   Highest education level: Not on file  Occupational History   Not on file  Tobacco Use   Smoking status: Never    Passive exposure: Never   Smokeless tobacco: Never  Vaping Use   Vaping status: Never Used  Substance and Sexual Activity   Alcohol use: Never   Drug use: Never   Sexual activity: Not Currently    Birth control/protection: Surgical  Other Topics  Concern   Not on file  Social History Narrative   Not on file   Social Drivers of Health   Financial Resource Strain: Not on file  Food Insecurity: No Food Insecurity (07/31/2023)   Hunger Vital Sign    Worried About Running Out of Food in the Last Year: Never true    Ran Out of Food in the Last Year: Never true  Transportation Needs: No Transportation Needs (07/31/2023)   PRAPARE - Administrator, Civil Service (Medical): No    Lack of Transportation (Non-Medical): No  Physical Activity: Not on file  Stress: Not on file  Social Connections: Not on file  Intimate Partner Violence: Not At Risk (07/31/2023)   Humiliation, Afraid, Rape, and Kick  questionnaire    Fear of Current or Ex-Partner: No    Emotionally Abused: No    Physically Abused: No    Sexually Abused: No      Review of Systems    All other ros negative  Objective:    BP 111/76   Pulse 100   Resp 16   Ht 5' 5 (1.651 m)   Wt 244 lb (110.7 kg)   SpO2 99%   BMI 40.60 kg/m  Nursing note and vital signs reviewed.  Physical Exam     General/constitutional: no distress, pleasant HEENT: Normocephalic, PER, Conj Clear, EOMI, Oropharynx clear Neck supple CV: rrr no mrg Lungs: clear to auscultation, normal respiratory effort Abd: Soft, Nontender Ext: no edema Skin: No Rash Neuro: nonfocal MSK: no peripheral joint swelling/tenderness/warmth; back spines nontender   Central line presence: rue picc site no erythema/purulence   Labs: Lab Results  Component Value Date   WBC 4.9 09/01/2023   HGB 9.1 (L) 09/01/2023   HCT 28.8 (L) 09/01/2023   MCV 91.4 09/01/2023   PLT 290 09/01/2023   Last metabolic panel Lab Results  Component Value Date   GLUCOSE 74 09/01/2023   NA 136 09/01/2023   K 3.9 09/01/2023   CL 104 09/01/2023   CO2 26 09/01/2023   BUN 12 09/01/2023   CREATININE 1.02 (H) 09/01/2023   GFRNONAA >60 09/01/2023   CALCIUM  8.7 (L) 09/01/2023   PHOS 3.8 08/19/2023   PROT 6.7  09/01/2023   ALBUMIN  2.5 (L) 09/01/2023   BILITOT 0.5 09/01/2023   ALKPHOS 58 09/01/2023   AST 13 (L) 09/01/2023   ALT 10 09/01/2023   ANIONGAP 6 09/01/2023    Micro:  Serology:  Imaging: Reviewed   08/23/23 tte post-op  1. Left ventricular ejection fraction, by estimation, is 55 to 60%. The  left ventricle has normal function. The left ventricle has no regional  wall motion abnormalities. There is mild concentric left ventricular  hypertrophy. Left ventricular diastolic  parameters are indeterminate.   2. Mildly D-shaped interventricular septum suggestive of a degree of  pressure/volume overload. Right ventricular systolic function is mildly  reduced. The right ventricular size is mildly enlarged. There is mildly  elevated pulmonary artery systolic  pressure. The estimated right ventricular systolic pressure is 40.6 mmHg.   3. Left atrial size was mildly dilated.   4. On-X mechanical mitral valve with mean gradient 3 mmHg. No significant  regurgitation.   5. The aortic valve is tricuspid. Aortic valve regurgitation is not  visualized. No aortic stenosis is present.   6. The inferior vena cava is dilated in size with <50% respiratory  variability, suggesting right atrial pressure of 15 mmHg.   7. Moderate pericardial effusion. There is no evidence of cardiac  tamponade.    Assessment & Plan:   Problem List Items Addressed This Visit   None Visit Diagnoses       Sepsis due to group B Streptococcus with acute organ dysfunction, unspecified organ dysfunction type, unspecified whether septic shock present (HCC)    -  Primary         No orders of the defined types were placed in this encounter.    Admission 07/31/23  63 yo female dm2, htn/hlp, obesity, gad, group b strep endocarditis s/p mvr on 08/13/23. She is here for id clinic f/u  5/1 bcx positive 5/10 bcx neg 5/14 tissue cx negative  Planned pcn g 6 weeks starting 5/14  09/19/23 id clinic  assessment Tolerating pcng  Opat labs reviewed 6/17 cbc 5/11/196 (normal diff); cr 1.4; esr 64; crp 2 Pending cardiology f/u and repeat echo  End of abx plan 09/24/23  No n/v/d/rash No focal joint/back pain No dyspnea/cough/leg swelling -- no sign chf on exam   She feels she is at her usual state of health   Picc removal can be done by hh on 6/25 No need for id clinic f/u unless sign of sepsis   Follow-up: Return if symptoms worsen or fail to improve.      Jamesetta Mcbride, MD [facility] 09/19/2023, 9:11 AM

## 2023-09-19 NOTE — Progress Notes (Signed)
 Called [facility] pharmacy and advised to cancel order for Carvedilol  12.5 mg, they state pt has not picked up med yet they will cancel order and return med to shelf, new RX for 6.25 mg BID sent in, they are aware  Sherolyn Dixon, RN, BSN, [facility] Specialty Coordinator [facility]

## 2023-09-20 DIAGNOSIS — I059 Rheumatic mitral valve disease, unspecified: Secondary | ICD-10-CM | POA: Diagnosis not present

## 2023-09-21 DIAGNOSIS — I059 Rheumatic mitral valve disease, unspecified: Secondary | ICD-10-CM | POA: Diagnosis not present

## 2023-09-22 ENCOUNTER — Other Ambulatory Visit: Payer: Self-pay | Admitting: Thoracic Surgery (Cardiothoracic Vascular Surgery)

## 2023-09-22 DIAGNOSIS — Z952 Presence of prosthetic heart valve: Secondary | ICD-10-CM

## 2023-09-22 DIAGNOSIS — I059 Rheumatic mitral valve disease, unspecified: Secondary | ICD-10-CM | POA: Diagnosis not present

## 2023-09-23 ENCOUNTER — Encounter

## 2023-09-23 ENCOUNTER — Ambulatory Visit

## 2023-09-23 DIAGNOSIS — I059 Rheumatic mitral valve disease, unspecified: Secondary | ICD-10-CM | POA: Diagnosis not present

## 2023-09-24 DIAGNOSIS — I059 Rheumatic mitral valve disease, unspecified: Secondary | ICD-10-CM | POA: Diagnosis not present

## 2023-09-25 ENCOUNTER — Ambulatory Visit (HOSPITAL_COMMUNITY)
Admission: RE | Admit: 2023-09-25 | Discharge: 2023-09-25 | Disposition: A | Discharge status: Home / Self Care | Source: Ambulatory Visit | Attending: Cardiovascular Disease | Admitting: Cardiovascular Disease

## 2023-09-25 DIAGNOSIS — I349 Nonrheumatic mitral valve disorder, unspecified: Secondary | ICD-10-CM | POA: Diagnosis not present

## 2023-09-25 DIAGNOSIS — Z952 Presence of prosthetic heart valve: Secondary | ICD-10-CM

## 2023-09-25 DIAGNOSIS — I059 Rheumatic mitral valve disease, unspecified: Secondary | ICD-10-CM | POA: Diagnosis not present

## 2023-09-25 LAB — ECHOCARDIOGRAM COMPLETE
Area-P 1/2: 5.2 cm2
MV VTI: 1.57 cm2
S' Lateral: 2.5 cm

## 2023-09-26 ENCOUNTER — Ambulatory Visit (HOSPITAL_COMMUNITY)
Admission: RE | Admit: 2023-09-26 | Discharge: 2023-09-26 | Disposition: A | Discharge status: Home / Self Care | Source: Ambulatory Visit | Attending: Internal Medicine

## 2023-09-26 DIAGNOSIS — I5032 Chronic diastolic (congestive) heart failure: Secondary | ICD-10-CM | POA: Insufficient documentation

## 2023-09-26 LAB — COMPREHENSIVE METABOLIC PANEL WITH GFR
ALT: 11 U/L (ref 0–44)
AST: 18 U/L (ref 15–41)
Albumin: 3.4 g/dL — ABNORMAL LOW (ref 3.5–5.0)
Alkaline Phosphatase: 47 U/L (ref 38–126)
Anion gap: 12 (ref 5–15)
BUN: 20 mg/dL (ref 8–23)
CO2: 24 mmol/L (ref 22–32)
Calcium: 9.8 mg/dL (ref 8.9–10.3)
Chloride: 101 mmol/L (ref 98–111)
Creatinine, Ser: 1.47 mg/dL — ABNORMAL HIGH (ref 0.44–1.00)
GFR, Estimated: 40 mL/min — ABNORMAL LOW (ref >60)
Glucose, Bld: 130 mg/dL — ABNORMAL HIGH (ref 70–99)
Potassium: 3.8 mmol/L (ref 3.5–5.1)
Sodium: 137 mmol/L (ref 135–145)
Total Bilirubin: 0.7 mg/dL (ref 0.0–1.2)
Total Protein: 8 g/dL (ref 6.5–8.1)

## 2023-09-26 LAB — BRAIN NATRIURETIC PEPTIDE: B Natriuretic Peptide: 93.1 pg/mL (ref 0.0–100.0)

## 2023-09-26 LAB — TSH: TSH: 1.731 u[IU]/mL (ref 0.350–4.500)

## 2023-09-29 ENCOUNTER — Ambulatory Visit: Attending: Cardiology

## 2023-09-29 ENCOUNTER — Ambulatory Visit: Payer: Self-pay | Admitting: Cardiology

## 2023-09-29 ENCOUNTER — Ambulatory Visit (HOSPITAL_COMMUNITY): Payer: Self-pay | Admitting: Cardiology

## 2023-09-29 DIAGNOSIS — I5032 Chronic diastolic (congestive) heart failure: Secondary | ICD-10-CM

## 2023-09-29 DIAGNOSIS — Z5181 Encounter for therapeutic drug level monitoring: Secondary | ICD-10-CM | POA: Diagnosis not present

## 2023-09-29 DIAGNOSIS — Z952 Presence of prosthetic heart valve: Secondary | ICD-10-CM | POA: Diagnosis not present

## 2023-09-29 DIAGNOSIS — I4891 Unspecified atrial fibrillation: Secondary | ICD-10-CM | POA: Insufficient documentation

## 2023-09-29 LAB — POCT INR: INR: 1.8 — AB (ref 2.0–3.0)

## 2023-09-29 MED ORDER — TORSEMIDE 20 MG PO TABS: ORAL_TABLET | ORAL | 3 refills | Status: AC

## 2023-09-29 NOTE — Patient Instructions (Signed)
 Increase to 1 tablet Daily.  INR in 1 week.  [phone].  A full discussion of the nature of anticoagulants has been carried out.  A benefit risk analysis has been presented to the patient, so that they understand the justification for choosing anticoagulation at this time. The need for frequent and regular monitoring, precise dosage adjustment and compliance is stressed.  Side effects of potential bleeding are discussed.  The patient should avoid any OTC items containing aspirin  or ibuprofen, and should avoid great swings in general diet.  Avoid alcohol consumption.  Call if any signs of abnormal bleeding.

## 2023-09-29 NOTE — Telephone Encounter (Signed)
 Called patient per Laymon Shed, PA. Pt reports she is currently taking torsemide  20 mg daily. Based on her recent lab work, [place] ordered the following:  Scr up slightly, reduce torsemide  to alternate between 20 mg every other day  Pt verbalized understanding of same.

## 2023-09-29 NOTE — Progress Notes (Signed)
Please see anticoagulation encounter.

## 2023-09-30 ENCOUNTER — Ambulatory Visit (HOSPITAL_COMMUNITY)
Admission: RE | Admit: 2023-09-30 | Discharge: 2023-09-30 | Disposition: A | Discharge status: Home / Self Care | Source: Ambulatory Visit | Attending: Cardiology | Admitting: Cardiology

## 2023-09-30 ENCOUNTER — Other Ambulatory Visit: Payer: Self-pay

## 2023-09-30 ENCOUNTER — Ambulatory Visit: Payer: Self-pay | Attending: Thoracic Surgery (Cardiothoracic Vascular Surgery) | Admitting: Surgical

## 2023-09-30 ENCOUNTER — Telehealth: Payer: Self-pay

## 2023-09-30 VITALS — BP 99/67 | HR 100 | Resp 20 | Ht 65.0 in | Wt 243.0 lb

## 2023-09-30 DIAGNOSIS — R0989 Other specified symptoms and signs involving the circulatory and respiratory systems: Secondary | ICD-10-CM | POA: Diagnosis not present

## 2023-09-30 DIAGNOSIS — Z952 Presence of prosthetic heart valve: Secondary | ICD-10-CM | POA: Diagnosis not present

## 2023-09-30 DIAGNOSIS — I340 Nonrheumatic mitral (valve) insufficiency: Secondary | ICD-10-CM | POA: Diagnosis not present

## 2023-09-30 MED ORDER — DAPAGLIFLOZIN PROPANEDIOL 10 MG PO TABS: 10.0000 mg | ORAL_TABLET | Freq: Every day | ORAL | 1 refills | Status: AC

## 2023-09-30 MED ORDER — SPIRONOLACTONE 25 MG PO TABS: 12.5000 mg | ORAL_TABLET | Freq: Every day | ORAL | 1 refills | Status: DC

## 2023-09-30 MED ORDER — AMIODARONE HCL 200 MG PO TABS
200.0000 mg | ORAL_TABLET | Freq: Every day | ORAL | 1 refills | Status: DC
Start: 2023-09-30 — End: 2023-12-12

## 2023-09-30 MED ORDER — SACUBITRIL-VALSARTAN 49-51 MG PO TABS: 1.0000 | ORAL_TABLET | Freq: Two times a day (BID) | ORAL | 1 refills | Status: DC

## 2023-09-30 MED ORDER — WARFARIN SODIUM 5 MG PO TABS: ORAL_TABLET | ORAL | 1 refills | Status: DC

## 2023-09-30 NOTE — Patient Instructions (Signed)
 Activity progression as discussed including driving and lifting restrictions

## 2023-09-30 NOTE — Telephone Encounter (Addendum)
 Patient walked in requesting refill on cardiac medications, Amiodarone  200mg , Farxiga  10mg , Entresto  49-51mg , Spironlactone 25mg  and Warfarin.  Refills provided for medication listed above with exception of Warfarin.   Will forward to Coumadin  clinic for assistance with that refill.  Will also forward to Jackee Alberts, NP to make him aware of refill requests as well. Pt thanked Charity fundraiser for assistance.

## 2023-09-30 NOTE — Progress Notes (Unsigned)
 [address]       [place] [address]             [phone]      Taran Floy [facility] Medical Record #[id number] Date of Birth: 03-25-1961  Referring: Lavona Agent, MD Primary Care: Katheen Roselie Rockford, NP Primary Cardiologist: Agent Lavona, MD   Chief Complaint:   POST OP FOLLOW UP   Operative Report    DATE OF PROCEDURE: 08/13/2023   PREOPERATIVE DIAGNOSIS: Severe mitral regurgitation secondary to endocarditis.   POSTOPERATIVE DIAGNOSIS: Severe mitral regurgitation secondary to endocarditis.   PROCEDURE:  Median sternotomy, extracorporeal circulation,  Mitral valve replacement with Onyx 31/33 mechanical prosthesis (reference ONXM, serial number [id number]),  Left atrial appendage clip using 40 mm AtriCure AtriClip Pro 240 (lot [id number]).   SURGEON: Elspeth BROCKS. Kerrin, MD.   ASSISTANT: Lemond Cera, PA.   ANESTHESIA: General.   History of Present Illness:    The patient is a 63 year old female seen in the office in routine postsurgical follow-up status post the above described procedure.  She had a complex pre and postoperative course but overall showed good progression to the point where she could go to CIR.  He did well there and subsequently has been discharged home where she continues to make progress.  She denies shortness of breath or chest pain.  She does have some incisional pain and describes pain medicine use as occasional oxycodone .  She is followed closely in the Coumadin  clinic and dosing has been adjusted.  She has been seen by advanced heart failure and post hospital follow-up.  She has completed her full antibiotic course for group B strep endocarditis.  She denies fevers, chills or other significant constitutional symptoms.  She has had no difficulties with her incisions.  She is not having any palpitations or lower extremity edema.  Overall, she is pleased with her general recovery .      Past Medical History:   Diagnosis Date   Anxiety    Breast cancer (HCC)    Diabetes mellitus without complication (HCC)    Hyperlipidemia    Hypertension    Type 2 diabetes mellitus without retinopathy (HCC) 06/17/2017     Social History   Tobacco Use  Smoking Status Never   Passive exposure: Never  Smokeless Tobacco Never    Social History   Substance and Sexual Activity  Alcohol Use Never     No Known Allergies  Current Outpatient Medications  Medication Sig Dispense Refill   alendronate  (FOSAMAX ) 70 MG tablet Take 1 tablet (70 mg total) by mouth once a week. Take with a full glass of water on an empty stomach. 12 tablet 3   amiodarone  (PACERONE ) 200 MG tablet Take 1 tablet (200 mg total) by mouth daily. 30 tablet 0   anastrozole  (ARIMIDEX ) 1 MG tablet Take 1 mg by mouth daily.     carvedilol  (COREG ) 6.25 MG tablet Take 1 tablet (6.25 mg total) by mouth 2 (two) times daily with a meal. 60 tablet 6   clonazePAM  (KLONOPIN ) 0.5 MG tablet Take 1 tablet (0.5 mg total) by mouth 2 (two) times daily as needed (anxiety). 30 tablet 0   Continuous Glucose Sensor (FREESTYLE LIBRE 3 PLUS SENSOR) MISC Inject 1 Units into the skin every 14 (fourteen) days. Change sensor every 15 days. 2 each 11   dapagliflozin  propanediol (FARXIGA ) 10 MG TABS tablet Take 1 tablet (10 mg total) by mouth daily. 30 tablet 0  glipiZIDE  (GLUCOTROL  XL) 10 MG 24 hr tablet Take 1 tablet (10 mg total) by mouth daily with breakfast. 30 tablet 0   insulin  glargine (LANTUS ) 100 UNIT/ML Solostar Pen Inject 20 Units into the skin 2 (two) times daily. 15 mL 11   Insulin  Pen Needle (PEN NEEDLES) 31G X 6 MM MISC 1 application  by Does not apply route at bedtime. 90 each 3   Insulin  Pen Needle 31G X 8 MM MISC Use as directed with your insulin  100 each 0   melatonin 5 MG TABS Take 1 tablet (5 mg total) by mouth at bedtime as needed. 30 tablet 0   metFORMIN  (GLUCOPHAGE ) 850 MG tablet Take 1 tablet (850 mg total) by mouth 2 (two) times daily with  a meal. 60 tablet 0   Nystatin (GERHARDT'S BUTT CREAM) CREA Apply 1 Application topically daily as needed for irritation. 1 each 0   oxyCODONE  (OXY IR/ROXICODONE ) 5 MG immediate release tablet Take 1-2 tablets (5-10 mg total) by mouth every 3 (three) hours as needed for severe pain (pain score 7-10). 30 tablet 0   pantoprazole  (PROTONIX ) 40 MG tablet Take 1 tablet (40 mg total) by mouth daily. 30 tablet 0   pravastatin  (PRAVACHOL ) 20 MG tablet Take 1 tablet (20 mg total) by mouth daily. 30 tablet 0   sacubitril -valsartan  (ENTRESTO ) 49-51 MG Take 1 tablet by mouth 2 (two) times daily. 60 tablet 0   spironolactone  (ALDACTONE ) 25 MG tablet Take 0.5 tablets (12.5 mg total) by mouth daily. 30 tablet 0   torsemide  (DEMADEX ) 20 MG tablet Take 20 mg every other day 45 tablet 3   Vitamin D , Ergocalciferol , (DRISDOL ) 1.25 MG (50000 UNIT) CAPS capsule TAKE 1 CAPSULE ONCE A WEEK FOR 12 WEEKS. AFTER 12 WEEKS SWITCH TO OVER THE COUNTER VITAMIN D  2000 IU 12 capsule 0   warfarin (COUMADIN ) 5 MG tablet Take one tablet (5 mg) on Tuesday, Wednesday, Thursday, Saturday, and Sunday and 1/2 tablet (2.5mg ) on Mondays and Fridays 30 tablet 0   No current facility-administered medications for this visit.       Physical Exam: BP 99/67   Pulse 100   Resp 20   Ht 5' 5 (1.651 m)   Wt 243 lb (110.2 kg)   SpO2 97% Comment: RA  BMI 40.44 kg/m   General appearance: alert, cooperative, and no distress Heart: regular rate and rhythm Lungs: clear to auscultation bilaterally Abdomen: Obese, benign Extremities: No lower extremity edema Wound: Incision is healing well without evidence of infection.   Diagnostic Studies & Laboratory data:     Recent Radiology Findings:   DG Chest 2 View Result Date: 09/30/2023 CLINICAL DATA:  Status post MVR. EXAM: CHEST - 2 VIEW COMPARISON:  08/18/2023. FINDINGS: The heart size and mediastinal contours are within normal limits. Stable postsurgical changes with MVR, left atrial  appendage clip, and sternotomy median sternotomy. Low lung volumes. No focal consolidation, pleural effusion, or pneumothorax. No acute osseous abnormality. IMPRESSION: No acute cardiopulmonary findings. Electronically Signed   By: Harrietta Sherry M.D.   On: 09/30/2023 11:29      Recent Lab Findings: Lab Results  Component Value Date   WBC 4.9 09/01/2023   HGB 9.1 (L) 09/01/2023   HCT 28.8 (L) 09/01/2023   PLT 290 09/01/2023   GLUCOSE 130 (H) 09/26/2023   CHOL 95 08/07/2023   TRIG 73 08/12/2023   HDL 27 (L) 08/07/2023   LDLDIRECT 97.0 04/03/2023   LDLCALC 58 08/07/2023   ALT 11 09/26/2023  AST 18 09/26/2023   NA 137 09/26/2023   K 3.8 09/26/2023   CL 101 09/26/2023   CREATININE 1.47 (H) 09/26/2023   BUN 20 09/26/2023   CO2 24 09/26/2023   TSH 1.731 09/26/2023   INR 1.8 (A) 09/29/2023   HGBA1C 12.8 (H) 07/31/2023      Assessment / Plan: The patient is doing well in her postsurgical recovery.  She will continue to be followed by cardiology and advanced heart failure.  She will be continued to be followed in the Coumadin  clinic.  She is maintaining sinus rhythm.  She did have postoperative atrial fibrillation that required DCCV.  She has completed her antibiotic course which was managed by ID.  I did not make any changes to her current medication regimen.  I reviewed her chest x-ray and no concerning issues at this time.  See the full report as described above.  We will see the patient again on a as needed basis for any surgically related needs or at request.      Medication Changes: No orders of the defined types were placed in this encounter.     Wilber Fini E Tasmin Exantus, PA-C  09/30/2023 11:36 AM

## 2023-10-02 ENCOUNTER — Other Ambulatory Visit: Payer: Self-pay | Admitting: Nurse Practitioner

## 2023-10-07 NOTE — Telephone Encounter (Signed)
 Called patient and informed her that the medication was refused by [facility] and that she would need to contact the oncologist office to get the refill for the Arimidex  1 mg. She thanked me for calling and stated that she has already called the oncology office for the refill.

## 2023-10-08 ENCOUNTER — Ambulatory Visit: Admitting: Nurse Practitioner

## 2023-10-09 ENCOUNTER — Ambulatory Visit: Attending: Cardiology | Admitting: *Deleted

## 2023-10-09 ENCOUNTER — Other Ambulatory Visit (HOSPITAL_BASED_OUTPATIENT_CLINIC_OR_DEPARTMENT_OTHER): Payer: Self-pay

## 2023-10-09 DIAGNOSIS — Z5181 Encounter for therapeutic drug level monitoring: Secondary | ICD-10-CM | POA: Diagnosis not present

## 2023-10-09 DIAGNOSIS — Z952 Presence of prosthetic heart valve: Secondary | ICD-10-CM | POA: Diagnosis not present

## 2023-10-09 LAB — POCT INR: POC INR: 5

## 2023-10-09 NOTE — Progress Notes (Signed)
Please see anticoagulation encounter.

## 2023-10-09 NOTE — Patient Instructions (Signed)
 Description   Hold warfarin 7/11 and 7/12  Then START taking 1 tablet daily except for 1/2 a tablet on Mondays and Fridays. Recheck INR in 1 week. Coumadin  Clinic [phone]

## 2023-10-10 ENCOUNTER — Telehealth: Payer: Self-pay

## 2023-10-10 NOTE — Telephone Encounter (Signed)
-----   Message from Lynwood Schilling sent at 10/10/2023  2:15 PM EDT ----- Please cancel the 9/9 appt with me since she is seeing Dr. Vergil a few days later.  Let her know why we are canceling.  Thanks.

## 2023-10-10 NOTE — Telephone Encounter (Signed)
 Called and spoke with pt. Let her know that Dr. Lavona stated she does not need to see him 9/9. Pt agreeable. Appt canceled. Pt verbalized understanding. All questions if any were answered.

## 2023-10-13 ENCOUNTER — Other Ambulatory Visit (HOSPITAL_BASED_OUTPATIENT_CLINIC_OR_DEPARTMENT_OTHER): Payer: Self-pay

## 2023-10-16 ENCOUNTER — Ambulatory Visit: Admitting: Nurse Practitioner

## 2023-10-16 ENCOUNTER — Encounter: Payer: Self-pay | Admitting: Nurse Practitioner

## 2023-10-16 VITALS — BP 138/78 | HR 103 | Temp 98.5°F | Ht 65.0 in | Wt 246.8 lb

## 2023-10-16 DIAGNOSIS — E785 Hyperlipidemia, unspecified: Secondary | ICD-10-CM

## 2023-10-16 DIAGNOSIS — I10 Essential (primary) hypertension: Secondary | ICD-10-CM | POA: Diagnosis not present

## 2023-10-16 DIAGNOSIS — Z7985 Long-term (current) use of injectable non-insulin antidiabetic drugs: Secondary | ICD-10-CM

## 2023-10-16 DIAGNOSIS — E1169 Type 2 diabetes mellitus with other specified complication: Secondary | ICD-10-CM

## 2023-10-16 DIAGNOSIS — E162 Hypoglycemia, unspecified: Secondary | ICD-10-CM | POA: Diagnosis not present

## 2023-10-16 DIAGNOSIS — Z7901 Long term (current) use of anticoagulants: Secondary | ICD-10-CM

## 2023-10-16 DIAGNOSIS — E559 Vitamin D deficiency, unspecified: Secondary | ICD-10-CM

## 2023-10-16 DIAGNOSIS — K219 Gastro-esophageal reflux disease without esophagitis: Secondary | ICD-10-CM

## 2023-10-16 DIAGNOSIS — Z7984 Long term (current) use of oral hypoglycemic drugs: Secondary | ICD-10-CM

## 2023-10-16 DIAGNOSIS — F411 Generalized anxiety disorder: Secondary | ICD-10-CM

## 2023-10-16 DIAGNOSIS — Z794 Long term (current) use of insulin: Secondary | ICD-10-CM

## 2023-10-16 LAB — RENAL FUNCTION PANEL
Albumin: 4.1 g/dL (ref 3.5–5.2)
BUN: 16 mg/dL (ref 6–23)
CO2: 29 meq/L (ref 19–32)
Calcium: 9.6 mg/dL (ref 8.4–10.5)
Chloride: 100 meq/L (ref 96–112)
Creatinine, Ser: 0.9 mg/dL (ref 0.40–1.20)
GFR: 67.36 mL/min (ref >60.00)
Glucose, Bld: 126 mg/dL — ABNORMAL HIGH (ref 70–99)
Phosphorus: 3.1 mg/dL (ref 2.3–4.6)
Potassium: 3.5 meq/L (ref 3.5–5.1)
Sodium: 137 meq/L (ref 135–145)

## 2023-10-16 LAB — VITAMIN D 25 HYDROXY (VIT D DEFICIENCY, FRACTURES): VITD: 32.49 ng/mL (ref 30.00–100.00)

## 2023-10-16 LAB — MICROALBUMIN / CREATININE URINE RATIO
Creatinine,U: 157.1 mg/dL
Microalb Creat Ratio: 51.2 mg/g — ABNORMAL HIGH (ref 0.0–30.0)
Microalb, Ur: 8 mg/dL — ABNORMAL HIGH (ref 0.0–1.9)

## 2023-10-16 LAB — POCT GLYCOSYLATED HEMOGLOBIN (HGB A1C): Hemoglobin A1C: 6.1 % — AB (ref 4.0–5.6)

## 2023-10-16 LAB — LDL CHOLESTEROL, DIRECT: Direct LDL: 86 mg/dL

## 2023-10-16 MED ORDER — PANTOPRAZOLE SODIUM 40 MG PO TBEC: 40.0000 mg | DELAYED_RELEASE_TABLET | Freq: Every day | ORAL | 0 refills | Status: DC

## 2023-10-16 MED ORDER — CLONAZEPAM 0.5 MG PO TABS: 0.5000 mg | ORAL_TABLET | Freq: Every day | ORAL | 0 refills | Status: AC | PRN

## 2023-10-16 MED ORDER — METFORMIN HCL 850 MG PO TABS: 850.0000 mg | ORAL_TABLET | Freq: Every day | ORAL | 1 refills | Status: DC

## 2023-10-16 MED ORDER — PRAVASTATIN SODIUM 20 MG PO TABS: 20.0000 mg | ORAL_TABLET | Freq: Every day | ORAL | 1 refills | Status: DC

## 2023-10-16 MED ORDER — DULOXETINE HCL 20 MG PO CPEP: 20.0000 mg | ORAL_CAPSULE | Freq: Every day | ORAL | 1 refills | Status: DC

## 2023-10-16 MED ORDER — SPIRONOLACTONE 25 MG PO TABS: 12.5000 mg | ORAL_TABLET | Freq: Every day | ORAL | 0 refills | Status: DC

## 2023-10-16 NOTE — Progress Notes (Signed)
 Established Patient Visit  Patient: Melissa Bray   DOB: 1960-04-24   63 y.o. Female  MRN: [id number] Visit Date: 10/16/2023  Subjective:    Chief Complaint  Patient presents with   Follow-up    3 month follow up  Refills needed   HPI GAD (generalized anxiety disorder) Increased anxiety with recent hospitalization. Current use of klonopin  0.5mg  daily prn Cymbalta  30mg  discontinued during hospitalization-unknown reason.  Provided temporal klonopin  refill for prn use Resume cymbalta  20mg  daily F/up in 22months  Hyperlipidemia associated with type 2 diabetes mellitus (HCC) Repeat LDL Maintain pravastatin  dose Refill sent  DM2 (diabetes mellitus, type 2) (HCC) Home glucose: 80s-90s Unable to afford CGM with current insurance Stopped lantus  2weeks ago and decreased metformin  dose to 850mg  daily due to symptomatic hypoglycemia  Repeat hgbA1c at 6.1% Repeat UACr, BMP, LDL, and UACr Stop insulin  and glipizide  Decrease metformin  dose to 850mg  once a day Maintain farxiga  dose F/up in 22month  Reviewed medical, surgical, and social history today  Medications: Outpatient Medications Prior to Visit  Medication Sig   alendronate  (FOSAMAX ) 70 MG tablet Take 1 tablet (70 mg total) by mouth once a week. Take with a full glass of water on an empty stomach.   amiodarone  (PACERONE ) 200 MG tablet Take 1 tablet (200 mg total) by mouth daily.   anastrozole  (ARIMIDEX ) 1 MG tablet Take 1 mg by mouth daily.   carvedilol  (COREG ) 6.25 MG tablet Take 1 tablet (6.25 mg total) by mouth 2 (two) times daily with a meal.   dapagliflozin  propanediol (FARXIGA ) 10 MG TABS tablet Take 1 tablet (10 mg total) by mouth daily.   Insulin  Pen Needle (PEN NEEDLES) 31G X 6 MM MISC 1 application  by Does not apply route at bedtime.   Insulin  Pen Needle 31G X 8 MM MISC Use as directed with your insulin    melatonin 5 MG TABS Take 1 tablet (5 mg total) by mouth at bedtime as needed.   Nystatin  (GERHARDT'S BUTT CREAM) CREA Apply 1 Application topically daily as needed for irritation.   sacubitril -valsartan  (ENTRESTO ) 49-51 MG Take 1 tablet by mouth 2 (two) times daily.   warfarin (COUMADIN ) 5 MG tablet Take 1-2 tablets daily or as prescribed by Coumadin  clinic.   [DISCONTINUED] clonazePAM  (KLONOPIN ) 0.5 MG tablet Take 1 tablet (0.5 mg total) by mouth 2 (two) times daily as needed (anxiety).   [DISCONTINUED] glipiZIDE  (GLUCOTROL  XL) 10 MG 24 hr tablet Take 1 tablet (10 mg total) by mouth daily with breakfast.   [DISCONTINUED] insulin  glargine (LANTUS ) 100 UNIT/ML Solostar Pen Inject 20 Units into the skin 2 (two) times daily.   [DISCONTINUED] metFORMIN  (GLUCOPHAGE ) 850 MG tablet Take 1 tablet (850 mg total) by mouth 2 (two) times daily with a meal.   [DISCONTINUED] oxyCODONE  (OXY IR/ROXICODONE ) 5 MG immediate release tablet Take 1-2 tablets (5-10 mg total) by mouth every 3 (three) hours as needed for severe pain (pain score 7-10).   [DISCONTINUED] pantoprazole  (PROTONIX ) 40 MG tablet Take 1 tablet (40 mg total) by mouth daily.   [DISCONTINUED] pravastatin  (PRAVACHOL ) 20 MG tablet Take 1 tablet (20 mg total) by mouth daily.   torsemide  (DEMADEX ) 20 MG tablet Take 20 mg every other day   Vitamin D , Ergocalciferol , (DRISDOL ) 1.25 MG (50000 UNIT) CAPS capsule TAKE 1 CAPSULE ONCE A WEEK FOR 12 WEEKS. AFTER 12 WEEKS SWITCH TO OVER THE COUNTER VITAMIN D  2000 IU   [  DISCONTINUED] Continuous Glucose Sensor (FREESTYLE LIBRE 3 PLUS SENSOR) MISC Inject 1 Units into the skin every 14 (fourteen) days. Change sensor every 15 days. (Patient not taking: Reported on 10/16/2023)   [DISCONTINUED] spironolactone  (ALDACTONE ) 25 MG tablet Take 0.5 tablets (12.5 mg total) by mouth daily.   No facility-administered medications prior to visit.   Reviewed past medical and social history.   ROS per HPI above      Objective:  BP 138/78 (BP Location: Right Arm, Patient Position: Sitting, Cuff Size: Large)   Pulse  (!) 103   Temp 98.5 F (36.9 C) (Oral)   Ht 5' 5 (1.651 m)   Wt 246 lb 12.8 oz (111.9 kg)   SpO2 98%   BMI 41.07 kg/m      Physical Exam Vitals and nursing note reviewed.  Constitutional:      Appearance: She is obese.  Cardiovascular:     Rate and Rhythm: Normal rate and regular rhythm.     Pulses: Normal pulses.     Heart sounds: Murmur heard.  Neurological:     Mental Status: She is alert and oriented to person, place, and time.     Results for orders placed or performed in visit on 10/16/23  POCT glycosylated hemoglobin (Hb A1C)  Result Value Ref Range   Hemoglobin A1C 6.1 (A) 4.0 - 5.6 %   HbA1c POC (<> result, manual entry)     HbA1c, POC (prediabetic range)     HbA1c, POC (controlled diabetic range)        Assessment & Plan:    Problem List Items Addressed This Visit     Anticoagulant long-term use   Relevant Medications   pantoprazole  (PROTONIX ) 40 MG tablet   DM2 (diabetes mellitus, type 2) (HCC) - Primary   Home glucose: 80s-90s Unable to afford CGM with current insurance Stopped lantus  2weeks ago and decreased metformin  dose to 850mg  daily due to symptomatic hypoglycemia  Repeat hgbA1c at 6.1% Repeat UACr, BMP, LDL, and UACr Stop insulin  and glipizide  Decrease metformin  dose to 850mg  once a day Maintain farxiga  dose F/up in 35month      Relevant Medications   metFORMIN  (GLUCOPHAGE ) 850 MG tablet   pravastatin  (PRAVACHOL ) 20 MG tablet   Other Relevant Orders   Microalbumin / creatinine urine ratio   POCT glycosylated hemoglobin (Hb A1C) (Completed)   Essential hypertension   Relevant Medications   spironolactone  (ALDACTONE ) 25 MG tablet   pravastatin  (PRAVACHOL ) 20 MG tablet   Other Relevant Orders   Renal Function Panel   GAD (generalized anxiety disorder)   Increased anxiety with recent hospitalization. Current use of klonopin  0.5mg  daily prn Cymbalta  30mg  discontinued during hospitalization-unknown reason.  Provided temporal klonopin   refill for prn use Resume cymbalta  20mg  daily F/up in 35months      Relevant Medications   clonazePAM  (KLONOPIN ) 0.5 MG tablet   DULoxetine  (CYMBALTA ) 20 MG capsule   GERD (gastroesophageal reflux disease)   Relevant Medications   pantoprazole  (PROTONIX ) 40 MG tablet   Hyperlipidemia associated with type 2 diabetes mellitus (HCC)   Repeat LDL Maintain pravastatin  dose Refill sent      Relevant Medications   spironolactone  (ALDACTONE ) 25 MG tablet   metFORMIN  (GLUCOPHAGE ) 850 MG tablet   pravastatin  (PRAVACHOL ) 20 MG tablet   Other Relevant Orders   Direct LDL   Vitamin D  deficiency   Relevant Medications   pantoprazole  (PROTONIX ) 40 MG tablet   Other Relevant Orders   VITAMIN D  25 Hydroxy (Vit-D Deficiency, Fractures)  Other Visit Diagnoses       Hypoglycemia       Relevant Orders   POCT glycosylated hemoglobin (Hb A1C) (Completed)      Return in about 3 months (around 01/16/2024) for HTN, DM, hyperlipidemia (fasting).     Roselie Mood, NP

## 2023-10-16 NOTE — Assessment & Plan Note (Addendum)
 Increased anxiety with recent hospitalization. Current use of klonopin  0.5mg  daily prn Cymbalta  30mg  discontinued during hospitalization-unknown reason.  Provided temporal klonopin  refill for prn use Resume cymbalta  20mg  daily F/up in 3months

## 2023-10-16 NOTE — Assessment & Plan Note (Addendum)
 Repeat LDL Maintain pravastatin  dose Refill sent

## 2023-10-16 NOTE — Patient Instructions (Addendum)
 Stop insulin  and glipizide  Decrease metformin  dose to 850mg  once a day Maintain farxiga  dose Use tylenol  for pain Get spironolactone  prescription at [facility]. Use GoodRx coupon. Other [facility]. Schedule eye exam Go to lab

## 2023-10-16 NOTE — Assessment & Plan Note (Addendum)
 Home glucose: 80s-90s Unable to afford CGM with current insurance Stopped lantus  2weeks ago and decreased metformin  dose to 850mg  daily due to symptomatic hypoglycemia  Repeat hgbA1c at 6.1% Repeat UACr, BMP, LDL, and UACr Stop insulin  and glipizide  Decrease metformin  dose to 850mg  once a day Maintain farxiga  dose F/up in 42month

## 2023-10-17 ENCOUNTER — Ambulatory Visit: Attending: Cardiology | Admitting: *Deleted

## 2023-10-17 DIAGNOSIS — I4891 Unspecified atrial fibrillation: Secondary | ICD-10-CM

## 2023-10-17 DIAGNOSIS — Z952 Presence of prosthetic heart valve: Secondary | ICD-10-CM

## 2023-10-17 LAB — POCT INR: INR: 1.4 — AB (ref 2.0–3.0)

## 2023-10-17 NOTE — Progress Notes (Signed)
Please see anticoagulation encounter.

## 2023-10-17 NOTE — Patient Instructions (Addendum)
 Description   Today take 1 tablet and tomorrow take 1.5 tablets of warfarin then START taking 1 tablet daily except for 1/2 tablet on Fridays. Recheck INR in 1 week. Keep your salads in your diet. Coumadin  Clinic [phone]

## 2023-10-22 ENCOUNTER — Ambulatory Visit: Payer: Self-pay | Admitting: Nurse Practitioner

## 2023-10-22 DIAGNOSIS — E1169 Type 2 diabetes mellitus with other specified complication: Secondary | ICD-10-CM

## 2023-10-22 MED ORDER — PRAVASTATIN SODIUM 40 MG PO TABS
40.0000 mg | ORAL_TABLET | Freq: Every evening | ORAL | 3 refills | Status: AC
Start: 2023-10-22 — End: ?

## 2023-10-24 ENCOUNTER — Ambulatory Visit: Attending: Cardiology

## 2023-10-24 DIAGNOSIS — Z952 Presence of prosthetic heart valve: Secondary | ICD-10-CM

## 2023-10-24 DIAGNOSIS — Z5181 Encounter for therapeutic drug level monitoring: Secondary | ICD-10-CM | POA: Diagnosis not present

## 2023-10-24 LAB — POCT INR: INR: 2.9 (ref 2.0–3.0)

## 2023-10-24 NOTE — Progress Notes (Signed)
 INR 2.9  Please see anticoagulation encounter

## 2023-10-24 NOTE — Patient Instructions (Signed)
 Continue taking 1 tablet daily except for 1/2 tablet on Fridays. Recheck INR in 3 weeks. Keep your salads in your diet. Coumadin  Clinic [phone]

## 2023-10-25 ENCOUNTER — Other Ambulatory Visit: Payer: Self-pay | Admitting: Nurse Practitioner

## 2023-10-25 DIAGNOSIS — Z79811 Long term (current) use of aromatase inhibitors: Secondary | ICD-10-CM

## 2023-10-25 DIAGNOSIS — M85851 Other specified disorders of bone density and structure, right thigh: Secondary | ICD-10-CM

## 2023-10-27 ENCOUNTER — Other Ambulatory Visit: Payer: Self-pay | Admitting: Cardiology

## 2023-10-29 ENCOUNTER — Telehealth (HOSPITAL_COMMUNITY): Payer: Self-pay

## 2023-10-29 ENCOUNTER — Other Ambulatory Visit: Payer: Self-pay | Admitting: Nurse Practitioner

## 2023-10-29 ENCOUNTER — Other Ambulatory Visit (HOSPITAL_COMMUNITY): Payer: Self-pay

## 2023-10-29 ENCOUNTER — Telehealth: Payer: Self-pay | Admitting: Cardiology

## 2023-10-29 ENCOUNTER — Telehealth: Payer: Self-pay | Admitting: Pharmacy Technician

## 2023-10-29 MED ORDER — SACUBITRIL-VALSARTAN 49-51 MG PO TABS
1.0000 | ORAL_TABLET | Freq: Two times a day (BID) | ORAL | 1 refills | Status: DC
  Filled 2023-10-29 – 2023-10-31 (×4): qty 180, 90d supply, fill #0

## 2023-10-29 NOTE — Telephone Encounter (Signed)
 Rx sent to pharmacy

## 2023-10-29 NOTE — Telephone Encounter (Signed)
 Advanced [facility] Patient Advocate Encounter  Patient contacted office requesting refills for Entresto . Refills have already been sent to [facility] [address]. Contacted pt to discuss, there is no current insurance information on file. Pt will being insurance card with her to pick up. Pt indicated she is enrolled in the [facility] Prescription Payment Plan.  Rachel DEL, CPhT Rx Patient Advocate Phone: [phone]

## 2023-10-29 NOTE — Telephone Encounter (Signed)
 Medication refilled today by Cardiologist

## 2023-10-29 NOTE — Telephone Encounter (Signed)
*  STAT* If patient is at the pharmacy, call can be transferred to refill team.   1. Which medications need to be refilled? (please list name of each medication and dose if known)   sacubitril -valsartan  (ENTRESTO ) 49-51 MG   NEW PHARMACY : PLEASE SEND TO [facility] ON [address]     4. Which pharmacy/location (including street and city if local pharmacy) is medication to be sent to?  [place] [facility] Phone: [phone]  Fax: [phone]       5. Do they need a 30 day or 90 day supply? 90

## 2023-10-29 NOTE — Telephone Encounter (Signed)
 PAP: Patient assistance application for Entresto  through [employer] has been mailed to pt's home address on file. Provider portion of application will be faxed to provider's office.  Provider portion will be uploaded into media

## 2023-10-30 ENCOUNTER — Other Ambulatory Visit (HOSPITAL_COMMUNITY): Payer: Self-pay

## 2023-10-30 NOTE — Telephone Encounter (Signed)
 Medication: Anastrozole  (Arimidex ) 1 mg  Directions: Take 1 tablet by mouth daily  Last given: Historical provider  Number refills: N/A Last o/v: 10/16/23 Follow up: 3 Months (Around 01/16/24)-NOT scheduled  Labs:   Last prescribed by historical provider

## 2023-10-31 ENCOUNTER — Telehealth: Payer: Self-pay | Admitting: Cardiology

## 2023-10-31 ENCOUNTER — Other Ambulatory Visit: Payer: Self-pay

## 2023-10-31 ENCOUNTER — Other Ambulatory Visit (HOSPITAL_COMMUNITY): Payer: Self-pay

## 2023-10-31 MED ORDER — SACUBITRIL-VALSARTAN 49-51 MG PO TABS
1.0000 | ORAL_TABLET | Freq: Two times a day (BID) | ORAL | 3 refills | Status: DC
  Filled 2023-10-31: qty 180, 90d supply, fill #0

## 2023-10-31 MED ORDER — SACUBITRIL-VALSARTAN 49-51 MG PO TABS: 1.0000 | ORAL_TABLET | Freq: Two times a day (BID) | ORAL | 3 refills | Status: DC

## 2023-10-31 NOTE — Telephone Encounter (Signed)
 Currently, we do not have Entresto  samples.

## 2023-10-31 NOTE — Telephone Encounter (Signed)
 Patient states her insurance will pay for the generic form of Entresto  (sacubitril -valsartan ) 49-51 mg BID.  Patient requests 90 day supply be sent to her pharmacy, [facility].

## 2023-10-31 NOTE — Telephone Encounter (Signed)
 Patient calling the office for samples of medication:   1.  What medication and dosage are you requesting samples for?  sacubitril -valsartan  (ENTRESTO ) 49-51 MG   2.  Are you currently out of this medication? Pt has been out of medication for two day. Script was sent in for the medication to our pharmacy but pharmacist tried to fill it and her insurance would not cover it with us . I am sending in note to send it to her mail order so she will need enough to get her through til she gets her mail order.

## 2023-10-31 NOTE — Addendum Note (Signed)
 Addended by: DARIO IZETTA CROME on: 10/31/2023 12:06 PM   Modules accepted: Orders

## 2023-10-31 NOTE — Telephone Encounter (Signed)
 Outpatient service line:  Patient calling stating that she is not able to pick up her Entresto  until Monday.  She was concerned that this would have side effects or something bad would happen.  Reassured her that nothing significant would likely happen, may have elevated blood pressures for couple days but nothing of any acute concerns.  Encouraged to call back for any other questions.

## 2023-10-31 NOTE — Telephone Encounter (Signed)
 Sent!

## 2023-10-31 NOTE — Telephone Encounter (Signed)
*  STAT* If patient is at the pharmacy, call can be transferred to refill team.   1. Which medications need to be refilled? (please list name of each medication and dose if known)   sacubitril -valsartan  (ENTRESTO ) 49-51 MG   2. Which pharmacy/location (including street and city if local pharmacy) is medication to be sent to? [facility] [place], [place] - [address] Phone: [phone]  Fax: [phone]     3. Do they need a 30 day or 90 day supply? 90

## 2023-10-31 NOTE — Telephone Encounter (Signed)
 Pt c/o medication issue:  1. Name of Medication: sacubitril -valsartan  (ENTRESTO ) 49-51 MG   2. How are you currently taking this medication (dosage and times per day)? N/A  3. Are you having a reaction (difficulty breathing--STAT)? No  4. What is your medication issue? Pt can't afford this medication and would like an alternative or some samples. Please advise

## 2023-11-03 ENCOUNTER — Other Ambulatory Visit (HOSPITAL_COMMUNITY): Payer: Self-pay

## 2023-11-03 ENCOUNTER — Telehealth (HOSPITAL_COMMUNITY): Payer: Self-pay | Admitting: *Deleted

## 2023-11-03 MED ORDER — SACUBITRIL-VALSARTAN 49-51 MG PO TABS: 1.0000 | ORAL_TABLET | Freq: Two times a day (BID) | ORAL | 3 refills | Status: AC

## 2023-11-03 NOTE — Telephone Encounter (Signed)
 Pt states if Entresto  rx is sent to [facility] for generic they can fill it for her, refill sent there, she will come by office today to pick up samples to last her until she receives it in the mail, if has any further issues she will call us  back.

## 2023-11-13 ENCOUNTER — Other Ambulatory Visit (HOSPITAL_COMMUNITY): Payer: Self-pay

## 2023-11-14 ENCOUNTER — Ambulatory Visit

## 2023-11-14 NOTE — Telephone Encounter (Signed)
 Faxed over completed patient application

## 2023-11-21 ENCOUNTER — Ambulatory Visit: Attending: Cardiology

## 2023-11-21 DIAGNOSIS — Z952 Presence of prosthetic heart valve: Secondary | ICD-10-CM

## 2023-11-21 LAB — POCT INR: INR: 3.3 — AB (ref 2.0–3.0)

## 2023-11-21 NOTE — Patient Instructions (Signed)
 Description   INR 3.3:Continue taking 1 tablet daily except for 1/2 tablet on Fridays.  Recheck INR in 3 weeks.  Keep your salads in your diet.  Coumadin  Clinic [phone]

## 2023-11-21 NOTE — Progress Notes (Signed)
 Description   INR 3.3:Continue taking 1 tablet daily except for 1/2 tablet on Fridays.  Recheck INR in 3 weeks.  Keep your salads in your diet.  Coumadin  Clinic [phone]

## 2023-11-26 NOTE — Telephone Encounter (Signed)
 Patient receiving through health insurance. Closed encounter

## 2023-11-28 ENCOUNTER — Other Ambulatory Visit

## 2023-12-03 DIAGNOSIS — E113293 Type 2 diabetes mellitus with mild nonproliferative diabetic retinopathy without macular edema, bilateral: Secondary | ICD-10-CM | POA: Diagnosis not present

## 2023-12-03 DIAGNOSIS — H2513 Age-related nuclear cataract, bilateral: Secondary | ICD-10-CM | POA: Diagnosis not present

## 2023-12-03 DIAGNOSIS — H524 Presbyopia: Secondary | ICD-10-CM | POA: Diagnosis not present

## 2023-12-03 DIAGNOSIS — H35363 Drusen (degenerative) of macula, bilateral: Secondary | ICD-10-CM | POA: Diagnosis not present

## 2023-12-03 DIAGNOSIS — H5203 Hypermetropia, bilateral: Secondary | ICD-10-CM | POA: Diagnosis not present

## 2023-12-03 DIAGNOSIS — H52203 Unspecified astigmatism, bilateral: Secondary | ICD-10-CM | POA: Diagnosis not present

## 2023-12-03 DIAGNOSIS — E1136 Type 2 diabetes mellitus with diabetic cataract: Secondary | ICD-10-CM | POA: Diagnosis not present

## 2023-12-09 ENCOUNTER — Ambulatory Visit: Admitting: Cardiology

## 2023-12-09 ENCOUNTER — Telehealth (HOSPITAL_COMMUNITY): Payer: Self-pay

## 2023-12-09 NOTE — Telephone Encounter (Signed)
 Medical clearance form faxed to [facility] at [phone] on 12/09/23

## 2023-12-12 ENCOUNTER — Ambulatory Visit

## 2023-12-12 ENCOUNTER — Encounter (HOSPITAL_COMMUNITY): Payer: Self-pay | Admitting: Cardiology

## 2023-12-12 ENCOUNTER — Ambulatory Visit
Admission: RE | Admit: 2023-12-12 | Discharge: 2023-12-12 | Disposition: A | Discharge status: Home / Self Care | Source: Ambulatory Visit | Attending: Cardiology | Admitting: Cardiology

## 2023-12-12 ENCOUNTER — Telehealth: Payer: Self-pay | Admitting: *Deleted

## 2023-12-12 ENCOUNTER — Ambulatory Visit (HOSPITAL_COMMUNITY): Payer: Self-pay | Admitting: Cardiology

## 2023-12-12 ENCOUNTER — Ambulatory Visit: Payer: Self-pay | Admitting: *Deleted

## 2023-12-12 VITALS — BP 118/80 | HR 88 | Wt 248.0 lb

## 2023-12-12 DIAGNOSIS — Z9012 Acquired absence of left breast and nipple: Secondary | ICD-10-CM | POA: Diagnosis not present

## 2023-12-12 DIAGNOSIS — E119 Type 2 diabetes mellitus without complications: Secondary | ICD-10-CM | POA: Insufficient documentation

## 2023-12-12 DIAGNOSIS — Z7984 Long term (current) use of oral hypoglycemic drugs: Secondary | ICD-10-CM | POA: Diagnosis not present

## 2023-12-12 DIAGNOSIS — I110 Hypertensive heart disease with heart failure: Secondary | ICD-10-CM | POA: Diagnosis not present

## 2023-12-12 DIAGNOSIS — E785 Hyperlipidemia, unspecified: Secondary | ICD-10-CM | POA: Diagnosis not present

## 2023-12-12 DIAGNOSIS — Z952 Presence of prosthetic heart valve: Secondary | ICD-10-CM | POA: Diagnosis not present

## 2023-12-12 DIAGNOSIS — Z7901 Long term (current) use of anticoagulants: Secondary | ICD-10-CM | POA: Insufficient documentation

## 2023-12-12 DIAGNOSIS — I4892 Unspecified atrial flutter: Secondary | ICD-10-CM | POA: Diagnosis not present

## 2023-12-12 DIAGNOSIS — Z794 Long term (current) use of insulin: Secondary | ICD-10-CM | POA: Insufficient documentation

## 2023-12-12 DIAGNOSIS — Z79899 Other long term (current) drug therapy: Secondary | ICD-10-CM | POA: Diagnosis not present

## 2023-12-12 DIAGNOSIS — Z853 Personal history of malignant neoplasm of breast: Secondary | ICD-10-CM | POA: Diagnosis not present

## 2023-12-12 DIAGNOSIS — I4891 Unspecified atrial fibrillation: Secondary | ICD-10-CM

## 2023-12-12 DIAGNOSIS — I5032 Chronic diastolic (congestive) heart failure: Secondary | ICD-10-CM | POA: Diagnosis not present

## 2023-12-12 DIAGNOSIS — I059 Rheumatic mitral valve disease, unspecified: Secondary | ICD-10-CM | POA: Diagnosis not present

## 2023-12-12 LAB — PROTIME-INR
INR: 2.1 — ABNORMAL HIGH (ref 0.8–1.2)
Prothrombin Time: 24.2 s — ABNORMAL HIGH (ref 11.4–15.2)

## 2023-12-12 LAB — COMPREHENSIVE METABOLIC PANEL WITH GFR
ALT: 17 U/L (ref 0–44)
AST: 18 U/L (ref 15–41)
Albumin: 3.9 g/dL (ref 3.5–5.0)
Alkaline Phosphatase: 44 U/L (ref 38–126)
Anion gap: 10 (ref 5–15)
BUN: 16 mg/dL (ref 8–23)
CO2: 26 mmol/L (ref 22–32)
Calcium: 9.7 mg/dL (ref 8.9–10.3)
Chloride: 103 mmol/L (ref 98–111)
Creatinine, Ser: 1.15 mg/dL — ABNORMAL HIGH (ref 0.44–1.00)
GFR, Estimated: 53 mL/min — ABNORMAL LOW (ref >60)
Glucose, Bld: 210 mg/dL — ABNORMAL HIGH (ref 70–99)
Potassium: 3.6 mmol/L (ref 3.5–5.1)
Sodium: 139 mmol/L (ref 135–145)
Total Bilirubin: 0.3 mg/dL (ref 0.0–1.2)
Total Protein: 7.6 g/dL (ref 6.5–8.1)

## 2023-12-12 LAB — TSH: TSH: 1.665 u[IU]/mL (ref 0.350–4.500)

## 2023-12-12 LAB — BRAIN NATRIURETIC PEPTIDE: B Natriuretic Peptide: 77.3 pg/mL (ref 0.0–100.0)

## 2023-12-12 MED ORDER — ASPIRIN 81 MG PO TBEC: 81.0000 mg | DELAYED_RELEASE_TABLET | Freq: Every day | ORAL | Status: AC

## 2023-12-12 NOTE — Patient Instructions (Signed)
 STOP Amiodarone .  START Asprin 81 mg daily.  Labs done today, your results will be available in MyChart, we will contact you for abnormal readings.  Your physician recommends that you schedule a follow-up appointment in: 3 months.  If you have any questions or concerns before your next appointment please send us  a message through [place] or call our office at [phone].    TO LEAVE A MESSAGE FOR THE NURSE SELECT OPTION 2, PLEASE LEAVE A MESSAGE INCLUDING: YOUR NAME DATE OF BIRTH CALL BACK NUMBER REASON FOR CALL**this is important as we prioritize the call backs  YOU WILL RECEIVE A CALL BACK THE SAME DAY AS LONG AS YOU CALL BEFORE 4:00 PM  At the [facility], you and your health needs are our priority. As part of our continuing mission to provide you with exceptional heart care, we have created designated Provider Care Teams. These Care Teams include your primary Cardiologist (physician) and Advanced Practice Providers (APPs- Physician Assistants and Nurse Practitioners) who all work together to provide you with the care you need, when you need it.   You may see any of the following providers on your designated Care Team at your next follow up: Dr Toribio Fuel Dr Ezra Shuck Dr. Ria Commander Dr. Morene Brownie Amy Lenetta, NP Caffie Shed, [place] [facility] [place], [place] Beckey Coe, NP [place] Lee, NP Ellouise Class, NP Tinnie Redman, PharmD Jaun Bash, PharmD   Please be sure to bring in all your medications bottles to every appointment.    Thank you for choosing [facility]

## 2023-12-12 NOTE — Patient Instructions (Signed)
 Description   INR 2.1; Spoke with patient and advised to continue taking 1 tablet daily except for 1/2 tablet on Fridays.  Recheck INR in 2 weeks.  Keep your salads in your diet.  Coumadin  Clinic [phone] 12/12/23-Amio stopped

## 2023-12-12 NOTE — Telephone Encounter (Signed)
-----   Message from Nurse Emer C sent at 12/12/2023 11:18 AM EDT ----- Regarding: change in INR goal Dr. Rolan is dropping her goal to 2-3 and we have drawn an INR today on her and will send it over to you once resulted.

## 2023-12-12 NOTE — Progress Notes (Signed)
 Description   INR 2.1; Spoke with patient and advised to continue taking 1 tablet daily except for 1/2 tablet on Fridays.  Recheck INR in 2 weeks.  Keep your salads in your diet.  Coumadin  Clinic [phone] 12/12/23-Amio stopped

## 2023-12-13 ENCOUNTER — Other Ambulatory Visit: Payer: Self-pay | Admitting: Nurse Practitioner

## 2023-12-13 DIAGNOSIS — I10 Essential (primary) hypertension: Secondary | ICD-10-CM

## 2023-12-13 NOTE — Progress Notes (Addendum)
 Advanced Heart Failure Clinic Note   PCP: Nche, Roselie Rockford, NP HF cardiology: Dr. Rolan  Chief Complaint: CHF  HPI:  [name] is a 63 y.o. female with PMH of HTN, HLD, T2DM, breast cancer s/p L mastectomy.  Myoview in 2017 at [facility] was low risk/negative for ischemia.    Presented to [facility] ED on 07/31/23 with weakness and fevers. She was found to be in severe sepsis felt to be due to LUE cellulitis. Blood cultures positive for Streptococcus agalacticae. LUE DVT study negative. Murmur found on exam and echo ordered, which showed EF 65-70% and nl RV, mod to likely severe MR.  She was transferred to [facility] for TEE showing acute mitral valve endocarditis with perforated mitral leaflet and severe MR (with mobile vegetation on both MV leaflets), no LAA thrombus, aneurysmal IAS with R bowing (no shunt), and normal EF. CTS was consulted for surgical repair. During surgical evaluation orthopantogram showed likely dental infectious source with evidence of abscess. Underwent dental extraction 5/8. L/RHC showed no CAD; RA 9, PA 34/21 (27), PCWP 13, LVEDP 12, PAPi 1.4. During pre-operative RHC for cardiac optimization, she developed flash pulmonary edema requiring intubation while on the table, likely related to MR and IVF volume received pre-operatively. Required high-dose pressors. Once medically stable, she underwent On-X mechanical MVR on 08/14/23. Post-op course c/b RV failure w/ volume overload and afib w/ RVR. She was diuresed w/ high dose lasix  gtt w/ milrinone  support. Once fully diuresed, she underwent successful DCCV back to NSR. Transitioned to amiodarone  po. She was continued on IV abx PCN G x 6 wks.  Echo in 6/25 showed EF 55-60%, normal RV, normal mechanical mitral valve with mean gradient 5 mmHg.   She returns for followup of CHF.  She is short of breath with long walks like across the mall.  She can walk up a flight of stairs without problems and has no  dyspnea with usual ADLs.  No palpitations, no chest pain, no orthopnea/PND. She does some walking for exercise. No lightheadedness. Weight up about 5 lbs.   ECG (personally reviewed): NSR, 1st degree AVB  Labs (7/25): LDL 86, K 3.5, creatinine 0.9  Review of Systems: All systems reviewed and negative except as per HPI.   PMH:  1. Atrial fibrillation: Paroxysmal.  Seen post-op MVR.  2. Endocarditis: Streptococcus agalactiae, dental source.  Patient developed mitral valve vegetation with perforation.  3. Severe mitral regurgitation: Due to mitral valve endocarditis.  - TEE (5/25) showed EF 65-70%, RV normal, vegetation on mitral valve with perforation and severe MR.  - 5/25 mechanical MVR with On-X valve and LA appendage clipping.  - Echo (6/25): EF 55-60%, normal RV, normal mechanical mitral valve with mean gradient 5 mmHg.  4. LHC (5/25) showed no CAD.  5. Hyperlipidemia 6. HTN 7. Type 2 diabetes   Current Outpatient Medications  Medication Sig Dispense Refill   alendronate  (FOSAMAX ) 70 MG tablet 1 TAB BY MOUTH ONCE A WEEK. TAKE WITH FULL GLASS OF WATER ON AN EMPTY STOMACH 12 tablet 3   anastrozole  (ARIMIDEX ) 1 MG tablet Take 1 mg by mouth daily.     aspirin  EC 81 MG tablet Take 1 tablet (81 mg total) by mouth daily. Swallow whole.     carvedilol  (COREG ) 6.25 MG tablet Take 1 tablet (6.25 mg total) by mouth 2 (two) times daily with a meal. 60 tablet 6   clonazePAM  (KLONOPIN ) 0.5 MG tablet Take 1 tablet (0.5 mg total)  by mouth daily as needed (anxiety). 30 tablet 0   dapagliflozin  propanediol (FARXIGA ) 10 MG TABS tablet Take 1 tablet (10 mg total) by mouth daily. 90 tablet 1   DULoxetine  (CYMBALTA ) 20 MG capsule Take 1 capsule (20 mg total) by mouth daily. 90 capsule 1   Insulin  Pen Needle (PEN NEEDLES) 31G X 6 MM MISC 1 application  by Does not apply route at bedtime. 90 each 3   Insulin  Pen Needle 31G X 8 MM MISC Use as directed with your insulin  100 each 0   melatonin 5 MG TABS Take  1 tablet (5 mg total) by mouth at bedtime as needed. 30 tablet 0   metFORMIN  (GLUCOPHAGE ) 850 MG tablet Take 1 tablet (850 mg total) by mouth daily with breakfast. 90 tablet 1   Nystatin (GERHARDT'S BUTT CREAM) CREA Apply 1 Application topically daily as needed for irritation. 1 each 0   pantoprazole  (PROTONIX ) 40 MG tablet Take 1 tablet (40 mg total) by mouth daily. 90 tablet 0   pravastatin  (PRAVACHOL ) 40 MG tablet Take 1 tablet (40 mg total) by mouth every evening. 90 tablet 3   sacubitril -valsartan  (ENTRESTO ) 49-51 MG Take 1 tablet by mouth 2 (two) times daily. 180 tablet 3   spironolactone  (ALDACTONE ) 25 MG tablet Take 0.5 tablets (12.5 mg total) by mouth daily. 30 tablet 0   torsemide  (DEMADEX ) 20 MG tablet Take 20 mg every other day 45 tablet 3   Vitamin D , Ergocalciferol , (DRISDOL ) 1.25 MG (50000 UNIT) CAPS capsule TAKE 1 CAPSULE ONCE A WEEK FOR 12 WEEKS. AFTER 12 WEEKS SWITCH TO OVER THE COUNTER VITAMIN D  2000 IU 12 capsule 0   warfarin (COUMADIN ) 5 MG tablet TAKE 1-2 TABLETS DAILY OR AS PRESCRIBED BY COUMADIN  CLINIC. 42 tablet 2   No current facility-administered medications for this encounter.    No Known Allergies    Social History   Socioeconomic History   Marital status: Divorced    Spouse name: Not on file   Number of children: 2   Years of education: Not on file   Highest education level: Not on file  Occupational History   Not on file  Tobacco Use   Smoking status: Never    Passive exposure: Never   Smokeless tobacco: Never  Vaping Use   Vaping status: Never Used  Substance and Sexual Activity   Alcohol use: Never   Drug use: Never   Sexual activity: Not Currently    Birth control/protection: Surgical  Other Topics Concern   Not on file  Social History Narrative   Not on file   Social Drivers of Health   Financial Resource Strain: Not on file  Food Insecurity: No Food Insecurity (07/31/2023)   Hunger Vital Sign    Worried About Running Out of Food in the  Last Year: Never true    Ran Out of Food in the Last Year: Never true  Transportation Needs: No Transportation Needs (07/31/2023)   PRAPARE - Administrator, Civil Service (Medical): No    Lack of Transportation (Non-Medical): No  Physical Activity: Not on file  Stress: Not on file  Social Connections: Not on file  Intimate Partner Violence: Not At Risk (07/31/2023)   Humiliation, Afraid, Rape, and Kick questionnaire    Fear of Current or Ex-Partner: No    Emotionally Abused: No    Physically Abused: No    Sexually Abused: No      Family History  Problem Relation Age of Onset  Asthma Mother    Cancer Father        prostate cancer   Diabetes Father    Hypertension Father    Cancer Brother        prostate cancer   Asthma Brother    Colon cancer Neg Hx    Colon polyps Neg Hx    Crohn's disease Neg Hx    Esophageal cancer Neg Hx    Rectal cancer Neg Hx    Stomach cancer Neg Hx    Ulcerative colitis Neg Hx     Vitals:   12/12/23 1035  BP: 118/80  Pulse: 88  SpO2: 98%  Weight: 112.5 kg (248 lb)   PHYSICAL EXAM: General: NAD Neck: No JVD, no thyromegaly or thyroid  nodule.  Lungs: Clear to auscultation bilaterally with normal respiratory effort. CV: Nondisplaced PMI.  Heart regular S1/S2 with mechanical S1, no S3/S4, 1/6 SEM RUSB.  No peripheral edema.  No carotid bruit.  Normal pedal pulses.  Abdomen: Soft, nontender, no hepatosplenomegaly, no distention.  Skin: Intact without lesions or rashes.  Neurologic: Alert and oriented x 3.  Psych: Normal affect. Extremities: No clubbing or cyanosis.  HEENT: Normal.   ASSESSMENT & PLAN:  1. Chronic diastolic CHF: Developed cardiogenic shock while hospitalized 5/25 for severe MR due to endocarditis. TEE in 5/25 showed LV EF 65%, normal RV function, severe MR with perforated leaflet and mobile vegetation.  Patient developed flash pulmonary edema in setting of marked hypertension -> IABP placed. S/p On-X mechanical  mitral valve with LA appendage clipping 08/14/23. Echo in 6/25 showed EF 55-60%, normal RV, normal mechanical mitral valve with mean gradient 5 mmHg. NYHA class I, not volume overloaded on exam.  - continue Farxiga  10 mg daily  - continue Entresto  49-51 mg bid.  - continue Coreg  6.25 mg bid  - continue spironolactone  12.5 mg daily.  BMET/BNP today.  - continue torsemide  20 mg every other day.   2. Mitral regurgitation: Severe on TEE in 5/25 with perforated anterior leaflet and mitral valve vegetation.  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 S/p On-X mechanical MVR/LA appendage clipping with Dr. Kerrin 08/14/23.  - Now on warfarin, can lower INR goal with On-X mechanical MV to 2-3.   - She needs to start ASA 81 for mechanical MV.  3. Atrial Flutter: Post-op.  S/p DCCV 5/25. NSR today.  - She has had no recurrent atrial arrhythmias, initial event was post-op in nature.  I will stop amiodarone . Check LFTs and TSH today.  - Continue warfarin, goal INR 2-3 as above.   Followup APP 3 months.   I spent 32 minutes reviewing data, interviewing patient and family, and organizing the orders/followup.   Melissa Shuck, MD 12/13/23

## 2023-12-14 ENCOUNTER — Other Ambulatory Visit (HOSPITAL_BASED_OUTPATIENT_CLINIC_OR_DEPARTMENT_OTHER): Payer: Self-pay

## 2023-12-16 ENCOUNTER — Telehealth (HOSPITAL_COMMUNITY): Payer: Self-pay | Admitting: Cardiology

## 2023-12-16 ENCOUNTER — Other Ambulatory Visit: Payer: Self-pay | Admitting: Nurse Practitioner

## 2023-12-16 DIAGNOSIS — I10 Essential (primary) hypertension: Secondary | ICD-10-CM

## 2023-12-16 NOTE — Telephone Encounter (Signed)
 Patient left VM on triage line Would like to know what are current Work restrictions after recent OV 9/12   Will call for additional details

## 2023-12-17 NOTE — Telephone Encounter (Signed)
 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

## 2023-12-17 NOTE — Telephone Encounter (Signed)
 Called patient to inquire about what she does for work so we can advise on her restrictions no answer no voicemail option

## 2023-12-17 NOTE — Telephone Encounter (Signed)
 We can give her the letter for light duty.

## 2023-12-22 NOTE — Telephone Encounter (Signed)
 Pt aware letter in front office

## 2023-12-25 ENCOUNTER — Ambulatory Visit: Attending: Cardiology

## 2023-12-25 DIAGNOSIS — Z5181 Encounter for therapeutic drug level monitoring: Secondary | ICD-10-CM

## 2023-12-25 DIAGNOSIS — Z952 Presence of prosthetic heart valve: Secondary | ICD-10-CM

## 2023-12-25 LAB — POCT INR: INR: 2.6 (ref 2.0–3.0)

## 2023-12-25 NOTE — Patient Instructions (Signed)
 continue taking 1 tablet daily except for 1/2 tablet on Fridays.  Recheck INR in 4 weeks.  Keep your salads in your diet.  Coumadin  Clinic [phone] 12/12/23-Amio stopped

## 2023-12-25 NOTE — Progress Notes (Signed)
 INR 2.6 Please see anticoagulation encounter continue taking 1 tablet daily except for 1/2 tablet on Fridays.  Recheck INR in 4 weeks.  Keep your salads in your diet.  Coumadin  Clinic [phone] 12/12/23-Amio stopped

## 2024-01-11 ENCOUNTER — Other Ambulatory Visit: Payer: Self-pay | Admitting: Nurse Practitioner

## 2024-01-11 DIAGNOSIS — E559 Vitamin D deficiency, unspecified: Secondary | ICD-10-CM

## 2024-01-11 DIAGNOSIS — Z7901 Long term (current) use of anticoagulants: Secondary | ICD-10-CM

## 2024-01-17 ENCOUNTER — Other Ambulatory Visit: Payer: Self-pay | Admitting: Cardiology

## 2024-01-22 ENCOUNTER — Ambulatory Visit: Attending: Cardiology | Admitting: *Deleted

## 2024-01-22 DIAGNOSIS — Z5181 Encounter for therapeutic drug level monitoring: Secondary | ICD-10-CM

## 2024-01-22 DIAGNOSIS — Z952 Presence of prosthetic heart valve: Secondary | ICD-10-CM | POA: Diagnosis not present

## 2024-01-22 LAB — POCT INR: POC INR: 1.7

## 2024-01-22 NOTE — Patient Instructions (Addendum)
 Description   INR; 1.7,  Take 1.5 tablets of warfarin today then continue taking 1 tablet daily except for 1/2 tablet on Fridays.  Recheck INR in 2 weeks.  Keep your salads in your diet.  Coumadin  Clinic [phone] 12/12/23-Amio stopped

## 2024-01-22 NOTE — Progress Notes (Signed)
 Lab Results  Component Value Date   INR 1.7 01/22/2024   INR 2.6 12/25/2023   INR 2.1 (H) 12/12/2023    Description   INR; 1.7,  Take 1.5 tablets of warfarin today then continue taking 1 tablet daily except for 1/2 tablet on Fridays.  Recheck INR in 2 weeks.  Keep your salads in your diet.  Coumadin  Clinic [phone] 12/12/23-Amio stopped

## 2024-02-05 ENCOUNTER — Ambulatory Visit

## 2024-02-05 DIAGNOSIS — Z1231 Encounter for screening mammogram for malignant neoplasm of breast: Secondary | ICD-10-CM | POA: Diagnosis not present

## 2024-02-06 ENCOUNTER — Ambulatory Visit: Attending: Cardiology | Admitting: Pharmacist

## 2024-02-06 DIAGNOSIS — I4891 Unspecified atrial fibrillation: Secondary | ICD-10-CM

## 2024-02-06 DIAGNOSIS — Z952 Presence of prosthetic heart valve: Secondary | ICD-10-CM

## 2024-02-06 LAB — POCT INR: INR: 1.5 — AB (ref 2.0–3.0)

## 2024-02-06 NOTE — Patient Instructions (Signed)
 Description   INR; 1.5,  Take 1.5 tablets of warfarin today then increase to 1 tablet daily Recheck INR in 2 weeks.  Keep your salads in your diet.  Coumadin  Clinic [phone] 12/12/23-Amio stopped

## 2024-02-06 NOTE — Progress Notes (Signed)
 Description   INR; 1.5,  Take 1.5 tablets of warfarin today then increase to 1 tablet daily Recheck INR in 2 weeks.  Keep your salads in your diet.  Coumadin  Clinic [phone] 12/12/23-Amio stopped

## 2024-02-25 ENCOUNTER — Ambulatory Visit: Attending: Cardiology | Admitting: Pharmacist

## 2024-02-25 DIAGNOSIS — Z952 Presence of prosthetic heart valve: Secondary | ICD-10-CM

## 2024-02-25 DIAGNOSIS — I4891 Unspecified atrial fibrillation: Secondary | ICD-10-CM

## 2024-02-25 LAB — POCT INR: INR: 1.4 — AB (ref 2.0–3.0)

## 2024-02-25 NOTE — Progress Notes (Signed)
 Description   INR; 1.4. Increase dose to 1.5 tablets Wednesdays and Fridays and 1 tablet the rest of the week Recheck INR in 2 weeks.  Keep your salads in your diet.  Coumadin  Clinic [phone] 12/12/23-Amio stopped

## 2024-02-25 NOTE — Patient Instructions (Signed)
 Description   INR; 1.4. Increase dose to 1.5 tablets Wednesdays and Fridays and 1 tablet the rest of the week Recheck INR in 2 weeks.  Keep your salads in your diet.  Coumadin  Clinic [phone] 12/12/23-Amio stopped

## 2024-03-03 DIAGNOSIS — S52351A Displaced comminuted fracture of shaft of radius, right arm, initial encounter for closed fracture: Secondary | ICD-10-CM | POA: Diagnosis not present

## 2024-03-03 DIAGNOSIS — S52561A Barton's fracture of right radius, initial encounter for closed fracture: Secondary | ICD-10-CM | POA: Insufficient documentation

## 2024-03-04 DIAGNOSIS — S52579A Other intraarticular fracture of lower end of unspecified radius, initial encounter for closed fracture: Secondary | ICD-10-CM | POA: Diagnosis not present

## 2024-03-04 DIAGNOSIS — M8000XA Age-related osteoporosis with current pathological fracture, unspecified site, initial encounter for fracture: Secondary | ICD-10-CM | POA: Diagnosis not present

## 2024-03-05 ENCOUNTER — Telehealth: Payer: Self-pay | Admitting: Cardiology

## 2024-03-05 ENCOUNTER — Encounter: Payer: Self-pay | Admitting: Pharmacist

## 2024-03-05 NOTE — Telephone Encounter (Signed)
 I reached out to requesting office at [phone] and spoke with their team about urgency, given that there is not enough time to coordinate anticoagulation recommendations (dose increased in November) and arrange virtual visit to discuss preop clearance. Their staff was able to read in the notes that options of operable versus nonoperable management were discussed so does not seem emergent, but she will place a msg to Dr. Jenna and her nurse to relay back to me by cell so we can help coordinate.

## 2024-03-05 NOTE — Telephone Encounter (Signed)
   Pre-operative Risk Assessment    Patient Name: Melissa Bray  DOB: [birth year] MRN: [id number]   Date of last office visit: 12/25/23 Date of next office visit: Not yet scheduled   Request for Surgical Clearance    Procedure:  ORIF of Right Distal Radius  Date of Surgery:  Clearance 03/09/24                                Surgeon:  Dr. Reena Banner Surgeon's Group or Practice Name:  [facility] Phone number:  [phone]  Fax number:  [phone]   Type of Clearance Requested:   - Medical  - Pharmacy:  Hold        Type of Anesthesia:  Not Indicated   Additional requests/questions:  Caller Denyse) stated can also speak with Dr. Eliazar nurse at her alternate office in [place] at [phone].  Caller reference patient's ED visit on 12/3.   Signed, Jasmin B Wilson   03/05/2024, 9:37 AM

## 2024-03-05 NOTE — Telephone Encounter (Addendum)
   Patient Name: Melissa Bray  DOB: [birth year] MRN: [id number]  Primary Cardiologist: Lynwood Schilling, MD / Dr. Rolan with Advanced Heart Failure Team  Chart reviewed as part of pre-operative protocol coverage. I spoke with pharmacy team. Given that patient has mechanical mitral valve and hx of afib, will need bridging. The patient r/s her INR check from 12/8 to 12/11. We will need to address plans for bridging at that time. She has a heart failure clinic visit to follow up on 03/12/24 at which time formal plans for preop clearance can be finalized. I called and spoke with [facility] to relay this update as they will need to adjust their surgical date. I gave them updated phone number of our office number as contact going forward.  I also called and spoke with the patient who is aware not to stop her blood thinner or make any changes to her regimen until seen in the appointments above. She is agreeable.  I will route to pharm so they're aware for Coumadin  visit, and to HF APP team working that day as FYI for need for surgical clearance at 12/12 OV (not sure who will be seeing her).   Lastly, will route this note to requesting surgeon for documentation on their end.    Bernette Seeman N Vandy Fong, PA-C 03/05/2024, 4:24 PM

## 2024-03-08 ENCOUNTER — Ambulatory Visit

## 2024-03-08 NOTE — Telephone Encounter (Signed)
 Patient with diagnosis of A Fib + MVR on warfarin for anticoagulation.    Procedure: ORIF of Right Distal Radius  Date of procedure: TBD   CHA2DS2-VASc Score = 4  This indicates a 4.8% annual risk of stroke. The patient's score is based upon: CHF History: 1 HTN History: 1 Diabetes History: 1 Stroke History: 0 Vascular Disease History: 0 Age Score: 0 Gender Score: 1    CrCl 87 ml/min Platelet count 290K  Patient has not had an Afib/aflutter ablation in the last 3 months, DCCV within the last 4 weeks or a watchman implanted in the last 45 days    Per office protocol, patient can hold warfarin  for 5 days prior to procedure.    Patient WILL need bridging with Lovenox  (enoxaparin ) around procedure.  **This guidance is not considered finalized until pre-operative APP has relayed final recommendations.**

## 2024-03-08 NOTE — Telephone Encounter (Signed)
 Calling to speak to the pre opp he left his number directly. Please advise

## 2024-03-08 NOTE — Telephone Encounter (Signed)
 Pt appt with Heart Failure has been moved up to 03/11/24.

## 2024-03-08 NOTE — Telephone Encounter (Signed)
 I received a call from ortho PA Jerrell with [facility] as I had originally left my cell phone number with their team when covering preop on Friday 12/5. Please see notes below regarding original correspondence for previous 12/9 surgical date. They were hoping to move patient's surgery to Friday 03/12/24 in case she only needed to do a 3 day Coumadin  hold. Unfortunately this still does not allow enough time for our team to complete her Coumadin  clinic appointment to discuss/arrange bridging as well as Heart Failure visits as scheduled 12/11 and 12/12 respectively. She originally had her Coumadin  clinic scheduled for today 03/08/24 but rescheduled for 03/11/24. I reviewed with pharmD Chris Pavero who confirms that Coumadin  would likely be a 5 day hold however contingent on follow-up INR value. Therefore I advised we will still continue plan as discussed; ortho is aware we will need to evaluate with scheduled plan above before finalizing recommendations. Jerrell verbalized understanding of recommendation. I have already routed the previous commentary below to pharmacy team as well as HF APP team to review at time of visits.  Also d/w Carol Fiato as well since she had fielded call from their team earlier.

## 2024-03-08 NOTE — Telephone Encounter (Signed)
 Desiree with [employer] is following up regarding clearance. She says, per their provider, the latest the procedure can be postponed is until 12/12, but appointment with HF Clinic is on 12/12 + coumadin  is on 12/11. She says their provider would like these appointments moved up as soon as possible so patient can keep her procedure. Please advise.

## 2024-03-08 NOTE — Telephone Encounter (Signed)
 I will send a message to Heart Failure clinic to see if they can move appt up sooner, see notes coming in from surgeon office.

## 2024-03-11 ENCOUNTER — Telehealth (HOSPITAL_COMMUNITY): Payer: Self-pay

## 2024-03-11 ENCOUNTER — Encounter (HOSPITAL_COMMUNITY): Payer: Self-pay

## 2024-03-11 ENCOUNTER — Ambulatory Visit

## 2024-03-11 ENCOUNTER — Ambulatory Visit
Admission: RE | Admit: 2024-03-11 | Discharge: 2024-03-11 | Disposition: A | Discharge status: Home / Self Care | Source: Ambulatory Visit | Attending: Physician Assistant | Admitting: Physician Assistant

## 2024-03-11 ENCOUNTER — Other Ambulatory Visit: Payer: Self-pay

## 2024-03-11 ENCOUNTER — Ambulatory Visit (HOSPITAL_COMMUNITY): Payer: Self-pay | Admitting: Physician Assistant

## 2024-03-11 VITALS — BP 128/78 | HR 100 | Ht 65.0 in | Wt 252.4 lb

## 2024-03-11 DIAGNOSIS — Z9012 Acquired absence of left breast and nipple: Secondary | ICD-10-CM | POA: Diagnosis not present

## 2024-03-11 DIAGNOSIS — I110 Hypertensive heart disease with heart failure: Secondary | ICD-10-CM | POA: Diagnosis not present

## 2024-03-11 DIAGNOSIS — Z79899 Other long term (current) drug therapy: Secondary | ICD-10-CM | POA: Diagnosis not present

## 2024-03-11 DIAGNOSIS — I330 Acute and subacute infective endocarditis: Secondary | ICD-10-CM | POA: Diagnosis not present

## 2024-03-11 DIAGNOSIS — I340 Nonrheumatic mitral (valve) insufficiency: Secondary | ICD-10-CM

## 2024-03-11 DIAGNOSIS — Z7984 Long term (current) use of oral hypoglycemic drugs: Secondary | ICD-10-CM | POA: Diagnosis not present

## 2024-03-11 DIAGNOSIS — I5032 Chronic diastolic (congestive) heart failure: Secondary | ICD-10-CM

## 2024-03-11 DIAGNOSIS — Z952 Presence of prosthetic heart valve: Secondary | ICD-10-CM

## 2024-03-11 DIAGNOSIS — I4892 Unspecified atrial flutter: Secondary | ICD-10-CM

## 2024-03-11 DIAGNOSIS — Z5181 Encounter for therapeutic drug level monitoring: Secondary | ICD-10-CM | POA: Diagnosis not present

## 2024-03-11 DIAGNOSIS — Z7982 Long term (current) use of aspirin: Secondary | ICD-10-CM | POA: Diagnosis present

## 2024-03-11 DIAGNOSIS — E785 Hyperlipidemia, unspecified: Secondary | ICD-10-CM | POA: Diagnosis not present

## 2024-03-11 DIAGNOSIS — X58XXXA Exposure to other specified factors, initial encounter: Secondary | ICD-10-CM | POA: Diagnosis not present

## 2024-03-11 DIAGNOSIS — Z794 Long term (current) use of insulin: Secondary | ICD-10-CM | POA: Diagnosis not present

## 2024-03-11 DIAGNOSIS — Z853 Personal history of malignant neoplasm of breast: Secondary | ICD-10-CM | POA: Diagnosis not present

## 2024-03-11 DIAGNOSIS — Z01810 Encounter for preprocedural cardiovascular examination: Secondary | ICD-10-CM

## 2024-03-11 DIAGNOSIS — S5291XA Unspecified fracture of right forearm, initial encounter for closed fracture: Secondary | ICD-10-CM | POA: Diagnosis not present

## 2024-03-11 DIAGNOSIS — E119 Type 2 diabetes mellitus without complications: Secondary | ICD-10-CM | POA: Diagnosis not present

## 2024-03-11 DIAGNOSIS — I10 Essential (primary) hypertension: Secondary | ICD-10-CM | POA: Diagnosis present

## 2024-03-11 DIAGNOSIS — Z7901 Long term (current) use of anticoagulants: Secondary | ICD-10-CM | POA: Diagnosis not present

## 2024-03-11 DIAGNOSIS — B951 Streptococcus, group B, as the cause of diseases classified elsewhere: Secondary | ICD-10-CM | POA: Diagnosis not present

## 2024-03-11 DIAGNOSIS — E669 Obesity, unspecified: Secondary | ICD-10-CM | POA: Diagnosis not present

## 2024-03-11 LAB — BASIC METABOLIC PANEL WITH GFR
Anion gap: 7 (ref 5–15)
BUN: 19 mg/dL (ref 8–23)
CO2: 28 mmol/L (ref 22–32)
Calcium: 9.4 mg/dL (ref 8.9–10.3)
Chloride: 102 mmol/L (ref 98–111)
Creatinine, Ser: 0.98 mg/dL (ref 0.44–1.00)
GFR, Estimated: >60 mL/min (ref >60)
Glucose, Bld: 141 mg/dL — ABNORMAL HIGH (ref 70–99)
Potassium: 4 mmol/L (ref 3.5–5.1)
Sodium: 137 mmol/L (ref 135–145)

## 2024-03-11 LAB — POCT INR: INR: 1.9 — AB (ref 2.0–3.0)

## 2024-03-11 LAB — BRAIN NATRIURETIC PEPTIDE: B Natriuretic Peptide: 89.8 pg/mL (ref 0.0–100.0)

## 2024-03-11 MED ORDER — ENOXAPARIN SODIUM 120 MG/0.8ML IJ SOSY: 120.0000 mg | PREFILLED_SYRINGE | Freq: Two times a day (BID) | INTRAMUSCULAR | 1 refills | Status: AC

## 2024-03-11 NOTE — Telephone Encounter (Signed)
 Please see today's office note regarding pre-op assessment for surgery.

## 2024-03-11 NOTE — Patient Instructions (Signed)
 There has been no changes to your medications.  Labs done today, your results will be available in MyChart, we will contact you for abnormal readings.  Your physician has requested that you have an echocardiogram. Echocardiography is a painless test that uses sound waves to create images of your heart. It provides your doctor with information about the size and shape of your heart and how well your hearts chambers and valves are working. This procedure takes approximately one hour. There are no restrictions for this procedure. Please do NOT wear cologne, perfume, aftershave, or lotions (deodorant is allowed). Please arrive 15 minutes prior to your appointment time.  Please note: We ask at that you not bring children with you during ultrasound (echo/ vascular) testing. Due to room size and safety concerns, children are not allowed in the ultrasound rooms during exams. Our front office staff cannot provide observation of children in our lobby area while testing is being conducted. An adult accompanying a patient to their appointment will only be allowed in the ultrasound room at the discretion of the ultrasound technician under special circumstances. We apologize for any inconvenience.  Your physician recommends that you schedule a follow-up appointment in: 4 months with an echocardiogram with Dr. Rolan.  If you have any questions or concerns before your next appointment please send us  a message through [place] or call our office at [phone].    TO LEAVE A MESSAGE FOR THE NURSE SELECT OPTION 2, PLEASE LEAVE A MESSAGE INCLUDING: YOUR NAME DATE OF BIRTH CALL BACK NUMBER REASON FOR CALL**this is important as we prioritize the call backs  YOU WILL RECEIVE A CALL BACK THE SAME DAY AS LONG AS YOU CALL BEFORE 4:00 PM  At the Advanced Heart Failure Clinic, you and your health needs are our priority. As part of our continuing mission to provide you with exceptional heart care, we have created  designated Provider Care Teams. These Care Teams include your primary Cardiologist (physician) and Advanced Practice Providers (APPs- Physician Assistants and Nurse Practitioners) who all work together to provide you with the care you need, when you need it.   You may see any of the following providers on your designated Care Team at your next follow up: Dr Toribio Fuel Dr Ezra Rolan Dr. Morene Brownie Greig Mosses, NP Caffie Shed, [place] [facility] [place], [place] Beckey Coe, NP Jordan Lee, NP Ellouise Class, NP Tinnie Redman, PharmD Jaun Bash, PharmD   Please be sure to bring in all your medications bottles to every appointment.    Thank you for choosing [facility]

## 2024-03-11 NOTE — Addendum Note (Signed)
 Encounter addended by: Colletta Manuelita Garre, PA-C on: 03/11/2024 12:22 PM  Actions taken: Clinical Note Signed

## 2024-03-11 NOTE — Progress Notes (Signed)
 INR 1.9 Please see anticoagulation encounter Take 1.5 tablets today only then continue 1.5 tablets Wednesdays and Fridays and 1 tablet the rest of the week.  Right wrist surgery 12/19; Holding Coumadin  12/14-18 Recheck INR in 2 weeks.  Keep your salads in your diet.  Coumadin  Clinic [phone] 12/12/23-Amio stopped

## 2024-03-11 NOTE — Progress Notes (Addendum)
 [facility] Note   PCP: Nche, Roselie Rockford, NP HF cardiology: Dr. Rolan  Chief Complaint: CHF  HPI:  Melissa Bray is a 63 y.o. female with PMH of HTN, HLD, T2DM, breast cancer s/p L mastectomy.  Myoview in 2017 at [facility] was low risk/negative for ischemia.    Presented to [facility] on 07/31/23 with weakness and fevers. She was found to be in severe sepsis felt to be due to LUE cellulitis. Blood cultures positive for Streptococcus agalacticae. LUE DVT study negative. Murmur found on exam and echo ordered, which showed EF 65-70% and nl RV, mod to likely severe MR.  She was transferred to [facility] for TEE showing acute mitral valve endocarditis with perforated mitral leaflet and severe MR (with mobile vegetation on both MV leaflets), no LAA thrombus, aneurysmal IAS with R bowing (no shunt), and normal EF. CTS was consulted for surgical repair. During surgical evaluation orthopantogram showed likely dental infectious source with evidence of abscess. Underwent dental extraction 5/8. L/RHC showed no CAD; RA 9, PA 34/21 (27), PCWP 13, LVEDP 12, PAPi 1.4. During pre-operative RHC for cardiac optimization, she developed flash pulmonary edema requiring intubation while on the table, likely related to MR and IVF volume received pre-operatively. Required high-dose pressors. Once medically stable, she underwent On-X mechanical MVR on 08/14/23. Post-op course c/b RV failure w/ volume overload and afib w/ RVR. She was diuresed w/ high dose lasix  gtt w/ milrinone  support. Once fully diuresed, she underwent successful DCCV back to NSR. Transitioned to amiodarone  po. She was continued on IV abx PCN G x 6 wks.  Echo in 6/25 showed EF 55-60%, normal RV, normal mechanical mitral valve with mean gradient 5 mmHg.   She is here today for CHF visit and pre-op evaluation. Planning to undergo ORIF for R radial fracture. Doing well from HF standpoint. No chest pain, shortness  of breath, orthopnea, PND or lower extremity edema. Maintains her home and goes for walks outdoors or at the mall every week. No change in activity tolerance. Taking all medications as prescribed.   ECG (personally reviewed): NSR 93, 1st degree AVB  Labs (7/25): LDL 86, K 3.5, creatinine 0.9 Labs (12/12/23): Scr 1.15, K 3.6, BNP 77 Labs (03/03/24): A1c 6.8%  Review of Systems: All systems reviewed and negative except as per HPI.   PMH:  1. Atrial fibrillation: Paroxysmal.  Seen post-op MVR.  2. Endocarditis: Streptococcus agalactiae, dental source.  Patient developed mitral valve vegetation with perforation.  3. Severe mitral regurgitation: Due to mitral valve endocarditis.  - TEE (5/25) showed EF 65-70%, RV normal, vegetation on mitral valve with perforation and severe MR.  - 5/25 mechanical MVR with On-X valve and LA appendage clipping.  - Echo (6/25): EF 55-60%, normal RV, normal mechanical mitral valve with mean gradient 5 mmHg.  4. LHC (5/25) showed no CAD.  5. Hyperlipidemia 6. HTN 7. Type 2 diabetes   Current Outpatient Medications  Medication Sig Dispense Refill   alendronate  (FOSAMAX ) 70 MG tablet 1 TAB BY MOUTH ONCE A WEEK. TAKE WITH FULL GLASS OF WATER ON AN EMPTY STOMACH 12 tablet 3   anastrozole  (ARIMIDEX ) 1 MG tablet Take 1 mg by mouth daily.     aspirin  EC 81 MG tablet Take 1 tablet (81 mg total) by mouth daily. Swallow whole.     carvedilol  (COREG ) 6.25 MG tablet Take 1 tablet (6.25 mg total) by mouth 2 (two) times daily with a meal. 60 tablet  6   dapagliflozin  propanediol (FARXIGA ) 10 MG TABS tablet Take 1 tablet (10 mg total) by mouth daily. 90 tablet 1   DULoxetine  (CYMBALTA ) 20 MG capsule Take 1 capsule (20 mg total) by mouth daily. 90 capsule 1   Insulin  Pen Needle (PEN NEEDLES) 31G X 6 MM MISC 1 application  by Does not apply route at bedtime. 90 each 3   Insulin  Pen Needle 31G X 8 MM MISC Use as directed with your insulin  100 each 0   metFORMIN  (GLUCOPHAGE ) 850  MG tablet Take 1 tablet (850 mg total) by mouth daily with breakfast. 90 tablet 1   Nystatin (GERHARDT'S BUTT CREAM) CREA Apply 1 Application topically daily as needed for irritation. 1 each 0   pantoprazole  (PROTONIX ) 40 MG tablet Take 1 tablet by mouth once daily 90 tablet 0   pravastatin  (PRAVACHOL ) 40 MG tablet Take 1 tablet (40 mg total) by mouth every evening. 90 tablet 3   sacubitril -valsartan  (ENTRESTO ) 49-51 MG Take 1 tablet by mouth 2 (two) times daily. 180 tablet 3   spironolactone  (ALDACTONE ) 25 MG tablet Take 0.5 tablets (12.5 mg total) by mouth daily. 45 tablet 0   torsemide  (DEMADEX ) 20 MG tablet Take 20 mg every other day 45 tablet 3   Vitamin D , Ergocalciferol , (DRISDOL ) 1.25 MG (50000 UNIT) CAPS capsule TAKE 1 CAPSULE ONCE A WEEK FOR 12 WEEKS. AFTER 12 WEEKS SWITCH TO OVER THE COUNTER VITAMIN D  2000 IU 12 capsule 0   warfarin (COUMADIN ) 5 MG tablet TAKE 1-2 TABLETS DAILY OR AS PRESCRIBED BY COUMADIN  CLINIC. 42 tablet 2   clonazePAM  (KLONOPIN ) 0.5 MG tablet Take 1 tablet (0.5 mg total) by mouth daily as needed (anxiety). (Patient not taking: Reported on 03/11/2024) 30 tablet 0   No current facility-administered medications for this encounter.    No Known Allergies    Social History   Socioeconomic History   Marital status: Divorced    Spouse name: Not on file   Number of children: 2   Years of education: Not on file   Highest education level: Not on file  Occupational History   Not on file  Tobacco Use   Smoking status: Never    Passive exposure: Never   Smokeless tobacco: Never  Vaping Use   Vaping status: Never Used  Substance and Sexual Activity   Alcohol use: Never   Drug use: Never   Sexual activity: Not Currently    Birth control/protection: Surgical  Other Topics Concern   Not on file  Social History Narrative   Not on file   Social Drivers of Health   Tobacco Use: Low Risk (03/11/2024)   Patient History    Smoking Tobacco Use: Never     Smokeless Tobacco Use: Never    Passive Exposure: Never  Financial Resource Strain: Not on file  Food Insecurity: No Food Insecurity (07/31/2023)   Hunger Vital Sign    Worried About Running Out of Food in the Last Year: Never true    Ran Out of Food in the Last Year: Never true  Transportation Needs: No Transportation Needs (07/31/2023)   PRAPARE - Administrator, Civil Service (Medical): No    Lack of Transportation (Non-Medical): No  Physical Activity: Not on file  Stress: Not on file  Social Connections: Not on file  Intimate Partner Violence: Not At Risk (07/31/2023)   Humiliation, Afraid, Rape, and Kick questionnaire    Fear of Current or Ex-Partner: No    Emotionally Abused:  No    Physically Abused: No    Sexually Abused: No  Depression (PHQ2-9): Low Risk (09/19/2023)   Depression (PHQ2-9)    PHQ-2 Score: 0  Alcohol Screen: Not on file  Housing: Low Risk (07/31/2023)   Housing Stability Vital Sign    Unable to Pay for Housing in the Last Year: No    Number of Times Moved in the Last Year: 0    Homeless in the Last Year: No  Utilities: Not At Risk (07/31/2023)   AHC Utilities    Threatened with loss of utilities: No  Health Literacy: Not on file      Family History  Problem Relation Age of Onset   Asthma Mother    Cancer Father        prostate cancer   Diabetes Father    Hypertension Father    Cancer Brother        prostate cancer   Asthma Brother    Colon cancer Neg Hx    Colon polyps Neg Hx    Crohn's disease Neg Hx    Esophageal cancer Neg Hx    Rectal cancer Neg Hx    Stomach cancer Neg Hx    Ulcerative colitis Neg Hx     Vitals:   03/11/24 1046  BP: 128/78  Pulse: 100  SpO2: 97%  Weight: 114.5 kg (252 lb 6.4 oz)  Height: 5' 5 (1.651 m)    PHYSICAL EXAM: General: Ambulated into clinic. Well appearing. Cor: No JVD. Regular rate & rhythm. Mechanical S1. No murmurs. Lungs: clear Abdomen: obese, soft, nontender, nondistended.  Extremities:  no edema Neuro: alert & orientedx3. Affect pleasant   ASSESSMENT & PLAN:  1. Chronic diastolic CHF: Developed cardiogenic shock while hospitalized 5/25 for severe MR due to endocarditis. TEE in 5/25 showed LV EF 65%, normal RV function, severe MR with perforated leaflet and mobile vegetation.  Patient developed flash pulmonary edema in setting of marked hypertension -> IABP placed. S/p On-X mechanical mitral valve with LA appendage clipping 08/14/23. Echo in 6/25 showed EF 55-60%, normal RV, normal mechanical mitral valve with mean gradient 5 mmHg. NYHA I. Volume okay. Takes 20 mg Torsemide  every other day. - continue Farxiga  10 mg daily  - continue Entresto  49-51 mg bid.  - continue Coreg  6.25 mg bid  - continue spironolactone  12.5 mg daily.   - BMET/BNP today - Echo at follow-up in 4 months 2. Mitral regurgitation: Severe on TEE in 5/25 with perforated anterior leaflet and mitral valve vegetation.  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 S/p On-X mechanical MVR/LA appendage clipping with Dr. Kerrin 08/14/23.  - Now on warfarin, INR goal 2-3 with On-X mechanical MV - continue 81 mg aspirin  daily 3. Atrial Flutter: Post-op.  S/p DCCV 5/25. SR on ECG today - She has had no recurrent atrial arrhythmias, initial event was post-op in nature.  Off amiodarone .  - Continue warfarin, goal INR 2-3 as above.  4. DM II - On insulin  and farxiga  - Last A1c was 6.8% 5. Obesity - Consider GLP-1 RA in the future, she was on ozempic  in the past 6. Pre-op caridiac risk assessment: She is planning to undergo ORIF for R radial fracture. No contraindication to proceed with surgery from cardiac standpoint. Using the RCRI risk calculator, her risk of major cardiac event peri-operatively is 5%. Needs to be bridged given mechanical mitral valve. She is going to anticoagulation clinic after today's visit for INR check and bridging instructions. If receiving general anesthesia,  can hold farxiga  3  days prior. She does not require SBE prophylaxis for this surgery.   Followup: 4 months with Dr. Rolan with echo   [facility], [facility], PA-C 03/11/2024

## 2024-03-11 NOTE — Patient Instructions (Signed)
 Take 1.5 tablets today only then continue 1.5 tablets Wednesdays and Fridays and 1 tablet the rest of the week.  Right wrist surgery 12/19; Holding Coumadin  12/14-18 Recheck INR in 2 weeks.  Keep your salads in your diet.  Coumadin  Clinic [phone] 12/12/23-Amio stopped  12/13: Last dose of warfarin.  12/14: No warfarin or enoxaparin  (Lovenox ).  12/15: Inject enoxaparin  120mg  in the fatty abdominal tissue at least 2 inches from the belly button twice a day about 12 hours apart, 8am and 8pm rotate sites. No warfarin.  12/16: Inject enoxaparin  in the fatty tissue every 12 hours, 8am and 8pm. No warfarin.  12/17 Inject enoxaparin  in the fatty tissue every 12 hours, 8am and 8pm. No warfarin.  12/18: Inject enoxaparin  in the fatty tissue in the morning at 8 am (No PM dose). No warfarin.  12/19: Procedure Day - No enoxaparin  - Resume warfarin in the evening or as directed by doctor (take an extra half tablet with usual dose for 2 days then resume normal dose).  12/20: Resume enoxaparin  inject in the fatty tissue every 12 hours and take warfarin  12/21: Inject enoxaparin  in the fatty tissue every 12 hours and take warfarin  12/22: Inject enoxaparin  in the fatty tissue every 12 hours and take warfarin  12/23: Inject enoxaparin  in the fatty tissue every 12 hours and take warfarin  12/24-25: Inject enoxaparin  in the fatty tissue every 12 hours and take warfarin  12/26: warfarin appt to check INR.

## 2024-03-11 NOTE — Telephone Encounter (Signed)
 Called to confirm/remind patient of their appointment at the [facility] on 03/11/24 11:00.   Appointment:   [x] Confirmed  [] Left mess   [] No answer/No voice mail  [] VM Full/unable to leave message  [] Phone not in service  Patient reminded to bring all medications and/or complete list.  Confirmed patient has transportation. Gave directions, instructed to utilize valet parking.

## 2024-03-12 ENCOUNTER — Encounter (HOSPITAL_COMMUNITY)

## 2024-03-17 ENCOUNTER — Other Ambulatory Visit: Payer: Self-pay | Admitting: Nurse Practitioner

## 2024-03-17 DIAGNOSIS — I10 Essential (primary) hypertension: Secondary | ICD-10-CM

## 2024-03-19 DIAGNOSIS — K219 Gastro-esophageal reflux disease without esophagitis: Secondary | ICD-10-CM | POA: Diagnosis not present

## 2024-03-19 DIAGNOSIS — Z952 Presence of prosthetic heart valve: Secondary | ICD-10-CM | POA: Diagnosis not present

## 2024-03-19 DIAGNOSIS — I4891 Unspecified atrial fibrillation: Secondary | ICD-10-CM | POA: Diagnosis not present

## 2024-03-19 DIAGNOSIS — I10 Essential (primary) hypertension: Secondary | ICD-10-CM | POA: Diagnosis not present

## 2024-03-19 DIAGNOSIS — Y939 Activity, unspecified: Secondary | ICD-10-CM | POA: Diagnosis not present

## 2024-03-19 DIAGNOSIS — Z7984 Long term (current) use of oral hypoglycemic drugs: Secondary | ICD-10-CM | POA: Diagnosis not present

## 2024-03-19 DIAGNOSIS — W19XXXA Unspecified fall, initial encounter: Secondary | ICD-10-CM | POA: Diagnosis not present

## 2024-03-19 DIAGNOSIS — S52571A Other intraarticular fracture of lower end of right radius, initial encounter for closed fracture: Secondary | ICD-10-CM | POA: Diagnosis not present

## 2024-03-19 DIAGNOSIS — Z7901 Long term (current) use of anticoagulants: Secondary | ICD-10-CM | POA: Diagnosis not present

## 2024-03-19 DIAGNOSIS — S52561A Barton's fracture of right radius, initial encounter for closed fracture: Secondary | ICD-10-CM | POA: Diagnosis not present

## 2024-03-19 DIAGNOSIS — M810 Age-related osteoporosis without current pathological fracture: Secondary | ICD-10-CM | POA: Diagnosis not present

## 2024-03-19 DIAGNOSIS — E119 Type 2 diabetes mellitus without complications: Secondary | ICD-10-CM | POA: Diagnosis not present

## 2024-03-20 ENCOUNTER — Other Ambulatory Visit: Payer: Self-pay | Admitting: Cardiology

## 2024-03-26 ENCOUNTER — Ambulatory Visit: Attending: Cardiology

## 2024-03-26 ENCOUNTER — Other Ambulatory Visit: Payer: Self-pay | Admitting: Nurse Practitioner

## 2024-03-26 DIAGNOSIS — Z952 Presence of prosthetic heart valve: Secondary | ICD-10-CM

## 2024-03-26 DIAGNOSIS — F339 Major depressive disorder, recurrent, unspecified: Secondary | ICD-10-CM

## 2024-03-26 DIAGNOSIS — Z5181 Encounter for therapeutic drug level monitoring: Secondary | ICD-10-CM

## 2024-03-26 DIAGNOSIS — F411 Generalized anxiety disorder: Secondary | ICD-10-CM

## 2024-03-26 LAB — POCT INR: INR: 1.8 — AB (ref 2.0–3.0)

## 2024-03-26 NOTE — Patient Instructions (Signed)
 Take 2 tablets today only then Increase to 1 tablet daily, except 1.5 tablets every Monday, Wednesday and Friday.   Recheck INR in 4 weeks.  Keep your salads in your diet.  Coumadin  Clinic [phone] 12/12/23-Amio stopped

## 2024-03-26 NOTE — Progress Notes (Signed)
"   INR 1.8 Please see anticoagulation encounter Take 2 tablets today only then Increase to 1 tablet daily, except 1.5 tablets every Monday, Wednesday and Friday.   Recheck INR in 4 weeks.  Keep your salads in your diet.  Coumadin  Clinic [phone] 12/12/23-Amio stopped "

## 2024-03-31 MED ORDER — DULOXETINE HCL 20 MG PO CPEP: 20.0000 mg | ORAL_CAPSULE | Freq: Every day | ORAL | 0 refills | Status: DC

## 2024-03-31 NOTE — Telephone Encounter (Signed)
 Requesting: DULOXETINE  HCL DR 30 MG CAP  Last Visit: 10/16/2023 Next Visit: Visit date not found Last Refill: 10/16/2023 of Duloxetine  20mg   Please Advise

## 2024-04-10 ENCOUNTER — Other Ambulatory Visit: Payer: Self-pay | Admitting: Nurse Practitioner

## 2024-04-10 DIAGNOSIS — E1169 Type 2 diabetes mellitus with other specified complication: Secondary | ICD-10-CM

## 2024-04-10 DIAGNOSIS — F411 Generalized anxiety disorder: Secondary | ICD-10-CM

## 2024-04-14 NOTE — Telephone Encounter (Signed)
 Patient overdue for a follow up appointment. Tried calling and call could not go through. Sent a MyChart message about refills and scheduled an appointment

## 2024-04-22 ENCOUNTER — Ambulatory Visit: Attending: Cardiology

## 2024-04-22 DIAGNOSIS — Z952 Presence of prosthetic heart valve: Secondary | ICD-10-CM | POA: Diagnosis not present

## 2024-04-22 DIAGNOSIS — Z5181 Encounter for therapeutic drug level monitoring: Secondary | ICD-10-CM

## 2024-04-22 LAB — POCT INR: INR: 2.4 (ref 2.0–3.0)

## 2024-04-22 NOTE — Progress Notes (Signed)
 INR 2.4  Continue 1 tablet daily, except 1.5 tablets every Monday, Wednesday and Friday.   Recheck INR in 4 weeks.  Keep your salads in your diet.  Coumadin  Clinic [phone] 12/12/23-Amio stopped

## 2024-04-22 NOTE — Patient Instructions (Signed)
 Continue 1 tablet daily, except 1.5 tablets every Monday, Wednesday and Friday.   Recheck INR in 4 weeks.  Keep your salads in your diet.  Coumadin  Clinic [phone] 12/12/23-Amio stopped

## 2024-04-23 ENCOUNTER — Ambulatory Visit

## 2024-05-20 ENCOUNTER — Ambulatory Visit

## 2024-07-08 ENCOUNTER — Other Ambulatory Visit (HOSPITAL_COMMUNITY)

## 2024-07-08 ENCOUNTER — Ambulatory Visit (HOSPITAL_COMMUNITY): Admitting: Cardiology
